# Patient Record
Sex: Female | Born: 1955
Health system: Southern US, Community
[De-identification: ages and names within clinical notes are randomized; demographics above are authoritative.]

## PROBLEM LIST (undated history)

## (undated) DIAGNOSIS — E559 Vitamin D deficiency, unspecified: Secondary | ICD-10-CM

## (undated) DIAGNOSIS — J90 Pleural effusion, not elsewhere classified: Secondary | ICD-10-CM

## (undated) DIAGNOSIS — F329 Major depressive disorder, single episode, unspecified: Secondary | ICD-10-CM

## (undated) DIAGNOSIS — A0472 Enterocolitis due to Clostridium difficile, not specified as recurrent: Secondary | ICD-10-CM

## (undated) DIAGNOSIS — C719 Malignant neoplasm of brain, unspecified: Secondary | ICD-10-CM

## (undated) DIAGNOSIS — M1990 Unspecified osteoarthritis, unspecified site: Secondary | ICD-10-CM

## (undated) DIAGNOSIS — E785 Hyperlipidemia, unspecified: Secondary | ICD-10-CM

## (undated) DIAGNOSIS — I341 Nonrheumatic mitral (valve) prolapse: Secondary | ICD-10-CM

## (undated) DIAGNOSIS — N3281 Overactive bladder: Secondary | ICD-10-CM

## (undated) DIAGNOSIS — I340 Nonrheumatic mitral (valve) insufficiency: Secondary | ICD-10-CM

## (undated) DIAGNOSIS — G709 Myoneural disorder, unspecified: Secondary | ICD-10-CM

## (undated) DIAGNOSIS — M810 Age-related osteoporosis without current pathological fracture: Secondary | ICD-10-CM

## (undated) DIAGNOSIS — F32A Depression, unspecified: Secondary | ICD-10-CM

## (undated) HISTORY — DX: Nonrheumatic mitral (valve) prolapse: I34.1

## (undated) HISTORY — DX: Hyperlipidemia, unspecified: E78.5

## (undated) HISTORY — DX: Age-related osteoporosis without current pathological fracture: M81.0

## (undated) HISTORY — DX: Vitamin D deficiency, unspecified: E55.9

## (undated) HISTORY — DX: Major depressive disorder, single episode, unspecified: F32.9

## (undated) HISTORY — DX: Depression, unspecified: F32.A

## (undated) HISTORY — PX: KNEE ARTHROSCOPY: SUR90

## (undated) HISTORY — DX: Malignant neoplasm of brain, unspecified: C71.9

## (undated) HISTORY — DX: Unspecified osteoarthritis, unspecified site: M19.90

## (undated) HISTORY — DX: Pleural effusion, not elsewhere classified: J90

## (undated) HISTORY — DX: Enterocolitis due to Clostridium difficile, not specified as recurrent: A04.72

## (undated) HISTORY — PX: CARDIAC CATHETERIZATION: SHX172

## (undated) HISTORY — DX: Nonrheumatic mitral (valve) insufficiency: I34.0

---
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
## 1999-06-26 ENCOUNTER — Other Ambulatory Visit: Admission: RE | Admit: 1999-06-26 | Discharge: 1999-06-26 | Payer: Self-pay | Admitting: Endocrinology

## 1999-08-27 ENCOUNTER — Encounter: Admission: RE | Admit: 1999-08-27 | Discharge: 1999-08-27 | Payer: Self-pay | Admitting: Endocrinology

## 1999-08-27 ENCOUNTER — Encounter: Payer: Self-pay | Admitting: Endocrinology

## 1999-10-08 ENCOUNTER — Encounter: Payer: Self-pay | Admitting: Neurosurgery

## 1999-10-08 ENCOUNTER — Encounter: Admission: RE | Admit: 1999-10-08 | Discharge: 1999-10-08 | Payer: Self-pay | Admitting: Neurosurgery

## 2000-03-05 ENCOUNTER — Encounter: Payer: Self-pay | Admitting: Endocrinology

## 2000-03-05 ENCOUNTER — Encounter: Admission: RE | Admit: 2000-03-05 | Discharge: 2000-03-05 | Payer: Self-pay | Admitting: Endocrinology

## 2000-04-07 ENCOUNTER — Other Ambulatory Visit: Admission: RE | Admit: 2000-04-07 | Discharge: 2000-04-07 | Payer: Self-pay | Admitting: Obstetrics and Gynecology

## 2000-09-18 ENCOUNTER — Encounter: Admission: RE | Admit: 2000-09-18 | Discharge: 2000-09-18 | Payer: Self-pay | Admitting: Neurosurgery

## 2000-09-18 ENCOUNTER — Encounter: Payer: Self-pay | Admitting: Neurosurgery

## 2000-11-04 ENCOUNTER — Encounter: Payer: Self-pay | Admitting: Endocrinology

## 2000-11-04 ENCOUNTER — Encounter: Admission: RE | Admit: 2000-11-04 | Discharge: 2000-11-04 | Payer: Self-pay | Admitting: Endocrinology

## 2001-08-04 ENCOUNTER — Other Ambulatory Visit: Admission: RE | Admit: 2001-08-04 | Discharge: 2001-08-04 | Payer: Self-pay | Admitting: Obstetrics and Gynecology

## 2001-08-11 ENCOUNTER — Encounter: Admission: RE | Admit: 2001-08-11 | Discharge: 2001-08-11 | Payer: Self-pay | Admitting: Obstetrics and Gynecology

## 2001-08-11 ENCOUNTER — Encounter: Payer: Self-pay | Admitting: Obstetrics and Gynecology

## 2002-03-22 ENCOUNTER — Encounter: Admission: RE | Admit: 2002-03-22 | Discharge: 2002-03-22 | Payer: Self-pay | Admitting: Neurosurgery

## 2002-03-22 ENCOUNTER — Encounter: Payer: Self-pay | Admitting: Neurosurgery

## 2003-11-01 ENCOUNTER — Other Ambulatory Visit: Admission: RE | Admit: 2003-11-01 | Discharge: 2003-11-01 | Payer: Self-pay | Admitting: Obstetrics and Gynecology

## 2003-11-03 ENCOUNTER — Encounter: Admission: RE | Admit: 2003-11-03 | Discharge: 2003-11-03 | Payer: Self-pay | Admitting: Obstetrics and Gynecology

## 2004-02-28 ENCOUNTER — Encounter: Admission: RE | Admit: 2004-02-28 | Discharge: 2004-02-28 | Payer: Self-pay | Admitting: Obstetrics and Gynecology

## 2004-07-19 ENCOUNTER — Ambulatory Visit: Payer: Self-pay | Admitting: Internal Medicine

## 2004-07-27 ENCOUNTER — Ambulatory Visit (HOSPITAL_COMMUNITY): Admission: RE | Admit: 2004-07-27 | Discharge: 2004-07-27 | Payer: Self-pay | Admitting: Internal Medicine

## 2004-07-27 ENCOUNTER — Ambulatory Visit: Payer: Self-pay | Admitting: Internal Medicine

## 2004-10-31 ENCOUNTER — Encounter: Payer: Self-pay | Admitting: Cardiology

## 2004-11-21 ENCOUNTER — Encounter: Admission: RE | Admit: 2004-11-21 | Discharge: 2004-11-21 | Payer: Self-pay | Admitting: Obstetrics and Gynecology

## 2005-10-08 ENCOUNTER — Encounter: Admission: RE | Admit: 2005-10-08 | Discharge: 2005-10-08 | Payer: Self-pay | Admitting: Neurology

## 2006-01-09 ENCOUNTER — Encounter: Admission: RE | Admit: 2006-01-09 | Discharge: 2006-01-09 | Payer: Self-pay | Admitting: Obstetrics and Gynecology

## 2007-06-22 ENCOUNTER — Ambulatory Visit (HOSPITAL_COMMUNITY): Admission: RE | Admit: 2007-06-22 | Discharge: 2007-06-22 | Payer: Self-pay | Admitting: Endocrinology

## 2007-08-05 ENCOUNTER — Encounter: Admission: RE | Admit: 2007-08-05 | Discharge: 2007-08-05 | Payer: Self-pay | Admitting: Endocrinology

## 2007-10-16 ENCOUNTER — Ambulatory Visit: Payer: Self-pay | Admitting: Vascular Surgery

## 2008-06-27 ENCOUNTER — Ambulatory Visit (HOSPITAL_COMMUNITY): Admission: RE | Admit: 2008-06-27 | Discharge: 2008-06-27 | Payer: Self-pay | Admitting: Endocrinology

## 2008-08-11 ENCOUNTER — Encounter: Admission: RE | Admit: 2008-08-11 | Discharge: 2008-08-11 | Payer: Self-pay | Admitting: Endocrinology

## 2008-11-02 ENCOUNTER — Encounter: Payer: Self-pay | Admitting: Cardiology

## 2009-07-03 ENCOUNTER — Ambulatory Visit (HOSPITAL_COMMUNITY): Admission: RE | Admit: 2009-07-03 | Discharge: 2009-07-03 | Payer: Self-pay | Admitting: Endocrinology

## 2010-03-05 ENCOUNTER — Encounter: Admission: RE | Admit: 2010-03-05 | Discharge: 2010-03-05 | Payer: Self-pay | Admitting: Endocrinology

## 2010-10-01 ENCOUNTER — Ambulatory Visit (HOSPITAL_COMMUNITY)
Admission: RE | Admit: 2010-10-01 | Discharge: 2010-10-01 | Payer: Self-pay | Source: Home / Self Care | Attending: Endocrinology | Admitting: Endocrinology

## 2010-12-27 ENCOUNTER — Other Ambulatory Visit: Payer: Self-pay | Admitting: Cardiology

## 2010-12-27 DIAGNOSIS — I341 Nonrheumatic mitral (valve) prolapse: Secondary | ICD-10-CM

## 2011-01-22 NOTE — Procedures (Signed)
LOWER EXTREMITY ARTERIAL EVALUATION-SINGLE LEVEL   INDICATION:  Both feet are cold, the patient has pain in the bottom of  her right foot.   HISTORY:  Diabetes:  No.  Cardiac:  No.  Hypertension:  No.  Smoking:  No.  Previous Surgery:  No.   RESTING SYSTOLIC PRESSURES: (ABI)                          RIGHT                LEFT  Brachial:               94                   90  Anterior tibial:        94 (1.0)             92  Posterior tibial:       92                   98 (>1.0)  Peroneal:  DOPPLER WAVEFORM ANALYSIS:  Anterior tibial:        Triphasic            Triphasic  Posterior tibial:       Triphasic            Triphasic  Peroneal:   PREVIOUS ABI'S:  Date:  RIGHT:  LEFT:   IMPRESSION:  1. No evidence of lower extremity arterial occlusive disease      bilaterally.  2. A preliminary copy was faxed to Dr. Rinaldo Cloud office.   ___________________________________________  Di Kindle. Edilia Bo, M.D.   DP/MEDQ  D:  10/16/2007  T:  10/18/2007  Job:  [id number]

## 2011-02-08 HISTORY — PX: US ECHOCARDIOGRAPHY: HXRAD669

## 2011-02-26 ENCOUNTER — Ambulatory Visit: Payer: Self-pay | Admitting: Cardiology

## 2011-02-28 ENCOUNTER — Ambulatory Visit: Payer: Self-pay | Admitting: Cardiology

## 2011-03-01 ENCOUNTER — Encounter: Payer: Self-pay | Admitting: *Deleted

## 2011-03-01 ENCOUNTER — Encounter: Payer: Self-pay | Admitting: Cardiology

## 2011-03-04 ENCOUNTER — Ambulatory Visit (HOSPITAL_COMMUNITY): Payer: Medicare Other | Attending: Cardiology

## 2011-03-04 ENCOUNTER — Ambulatory Visit (INDEPENDENT_AMBULATORY_CARE_PROVIDER_SITE_OTHER): Payer: Medicare Other | Admitting: Cardiology

## 2011-03-04 ENCOUNTER — Encounter: Payer: Self-pay | Admitting: Cardiology

## 2011-03-04 ENCOUNTER — Other Ambulatory Visit (HOSPITAL_COMMUNITY): Payer: Self-pay | Admitting: Radiology

## 2011-03-04 VITALS — BP 86/58 | HR 96 | Ht 60.0 in | Wt 85.4 lb

## 2011-03-04 DIAGNOSIS — I079 Rheumatic tricuspid valve disease, unspecified: Secondary | ICD-10-CM | POA: Insufficient documentation

## 2011-03-04 DIAGNOSIS — I379 Nonrheumatic pulmonary valve disorder, unspecified: Secondary | ICD-10-CM | POA: Insufficient documentation

## 2011-03-04 DIAGNOSIS — I059 Rheumatic mitral valve disease, unspecified: Secondary | ICD-10-CM

## 2011-03-04 DIAGNOSIS — I340 Nonrheumatic mitral (valve) insufficiency: Secondary | ICD-10-CM | POA: Insufficient documentation

## 2011-03-04 DIAGNOSIS — R0602 Shortness of breath: Secondary | ICD-10-CM

## 2011-03-04 DIAGNOSIS — I341 Nonrheumatic mitral (valve) prolapse: Secondary | ICD-10-CM

## 2011-03-04 NOTE — Assessment & Plan Note (Signed)
Patient appears to have severe mitral regurgitation with bileaflet MV prolapse on echo.  This is progressive from 2006.  The LV is not enlarged and EF is about 60%.  She has not noted significant exertional dyspnea but she has been having symptoms at night consistent with possible PND and worsening orthopnea.  On exam, she is not volume overloaded.  She would be a difficult candidate for mitral valve repair given her baseline physical limitations.  Rehab post-op would be difficult for her.  I am going to do a TEE to more closely assess the valve (is MR truly severe?).  She has no difficulty with swallowing.  If she has severe MR, it would seem to be symptomatic given increased nocturnal symptoms.  I will have her back to discuss options/further plans after TEE.  It may be that a minimally invasive MV repair would be feasible.  She has had stable ataxia/gait instability/spasticity for years and has compensated fairly well (lives alone, goes to [employer], Catering manager).  She is relatively young at 59.  I will check a BNP.   We will call her PCP's office to get a copy of her most recent lipids.

## 2011-03-04 NOTE — Patient Instructions (Addendum)
Your physician recommends that you schedule a follow-up appointment in: 2-3 WEEKS WITH DR [facility] Your physician recommends that you continue on your current medications as directed. Please refer to the Current Medication list given to you today. CONT TO TAKE BABY  ASPIRIN 81 MG   Your physician recommends that you return for lab work in: TODAY BMET BNP    DX 424.0  Your physician has requested that you have a TEE. During a TEE, sound waves are used to create images of your heart. It provides your doctor with information about the size and shape of your heart and how well your heart's chambers and valves are working. In this test, a transducer is attached to the end of a flexible tube that's guided down your throat and into your esophagus (the tube leading from you mouth to your stomach) to get a more detailed image of your heart. You are not awake for the procedure. Please see the instruction sheet given to you today. For further information please visit [email or web address]

## 2011-03-04 NOTE — Progress Notes (Signed)
PCP: Dr. 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  She has seen Dr. Deborah Chalk in the past and is seeing me for the first time today.  Her last echo prior to today was in 2006 and showed mitral valve prolapse with moderate MR.  She had an echo done today which I reviewed.  This showed normal LV size and systolic function with bileaflet mitral valve prolapse and probably severe mitral regurgitation.   Patient has poor balance from ataxia and can walk limited distances with a walker but typically uses her wheelchair.  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  She goes to the [facility] twice a week and uses a rowing machine.  She also does yoga.  She has not noted shortness of breath at the [facility] or with wheeling her wheelchair.  She has noted more shortness of breath at night.  She has had episodes consistent with PND recently.  She has chronically had to sleep on 2 pillows due to a sensation of "smothering."  Lately, she has increased this to 3 pillows.  No chest pain.  Her BP is 86/50 today.  She says it is common for her BP to run in the 80s-90s systolic.   ECG: NSR, normal  PMH:  1. Mitral valve prolapse with mitral regurgitation.  Echo (2/06): Moderate MR.  Echo (6/12): EF 60%, normal LV size, indeterminant diastolic function (suspect pseudonormal), bileaflet mitral valve prolapse with severe mitral regurgitation, moderate LAE, PA systolic pressure 28 mmHg.  2. Astrocytoma s/p resection and radiation in 1971.  She was left with residual spasticity and ataxia and is mainly limited to a wheelchair.  3. Hyperlipidemia 4. Arterial dopplers (2/09): normal  SH: Uses wheelchair but can walk short distances with walker. Lives by herself in [place] but sister and mother help.  Nonsmoker.  Occasional ETOH.   FH: Father with MI at 56  ROS: all systems reviewed and negative except as  per HPI.   Current Outpatient Prescriptions  Medication Sig Dispense Refill  . aspirin 81 MG EC tablet Take 81 mg by mouth daily.        Marland Kitchen imipramine (TOFRANIL) 25 MG tablet Take 25 mg by mouth at bedtime.        . meloxicam (MOBIC) 15 MG tablet Take 15 mg by mouth daily.        . simvastatin (ZOCOR) 40 MG tablet Take 40 mg by mouth at bedtime.        . tolterodine (DETROL LA) 4 MG 24 hr capsule Take 4 mg by mouth daily.          BP 86/58  Pulse 96  Ht 5' (1.524 m)  Wt 85 lb 6.4 oz (38.737 kg)  BMI 16.68 kg/m2 General: thin, no distress Neck: No JVD, no thyromegaly or thyroid nodule.  Lungs: Clear to auscultation bilaterally with normal respiratory effort. CV: Nondisplaced PMI.  Heart regular S1/S2, no S3/S4, 3/6 HSM at apex.  No peripheral edema.  No carotid bruit.  Normal pedal pulses.  Abdomen: Soft, nontender, no hepatosplenomegaly, no distention.  Neurologic: Alert and oriented x 3, gait ataxia, resting tremor noted in hands.  Psych: Normal affect. Extremities: No clubbing or cyanosis.

## 2011-03-05 ENCOUNTER — Encounter: Payer: Self-pay | Admitting: *Deleted

## 2011-03-06 ENCOUNTER — Ambulatory Visit (HOSPITAL_COMMUNITY): Admission: RE | Admit: 2011-03-06 | Payer: Medicare Other | Source: Ambulatory Visit | Admitting: Cardiovascular Disease

## 2011-03-10 HISTORY — PX: TRANSESOPHAGEAL ECHOCARDIOGRAM: SHX273

## 2011-03-18 ENCOUNTER — Ambulatory Visit (HOSPITAL_COMMUNITY)
Admission: RE | Admit: 2011-03-18 | Discharge: 2011-03-18 | Disposition: A | Discharge status: Home / Self Care | Payer: Medicare Other | Source: Ambulatory Visit | Attending: Cardiology | Admitting: Cardiology

## 2011-03-18 ENCOUNTER — Other Ambulatory Visit: Payer: Self-pay | Admitting: *Deleted

## 2011-03-18 DIAGNOSIS — I059 Rheumatic mitral valve disease, unspecified: Secondary | ICD-10-CM

## 2011-03-18 DIAGNOSIS — R0602 Shortness of breath: Secondary | ICD-10-CM

## 2011-03-20 ENCOUNTER — Telehealth: Payer: Self-pay | Admitting: Cardiology

## 2011-03-20 NOTE — Telephone Encounter (Signed)
Per pt call, pt said she was prescribed a diuretic (water pill), pt said she is also taking detrol, she said one makes her urinate and one makes her stop urinating. Pt would like to know if she needs to continue taking both medications.

## 2011-03-20 NOTE — Telephone Encounter (Signed)
Pt calling stating is taking detrol for bladder incontinence prescribed by urologist and recently prescribed diuretic by dr mclean--pt states "one makes me urinate and the other stops me from urinating--which one should i take"--advised--please stay on diuretic and may stop detrol for now--i will contact dr Shirlee Latch and find out which med he wants her on and call her back--pt agrees--nt

## 2011-03-20 NOTE — Telephone Encounter (Signed)
Pt called and given information from Dr. Shirlee Latch

## 2011-03-20 NOTE — Telephone Encounter (Signed)
She can take both.  Detrol just stops bladder spasm.  It won't stop her from urinating altogether.

## 2011-03-25 ENCOUNTER — Telehealth: Payer: Self-pay | Admitting: Cardiology

## 2011-03-25 NOTE — Telephone Encounter (Signed)
Pt has appt tomorrow, pt would like to see pictures of echocardiogram during visit if possible.

## 2011-03-25 NOTE — Telephone Encounter (Signed)
Pt can look at echo pictures tomorrow per Dr Shirlee Latch. Pt is aware.

## 2011-03-26 ENCOUNTER — Ambulatory Visit (INDEPENDENT_AMBULATORY_CARE_PROVIDER_SITE_OTHER): Payer: Medicare Other | Admitting: Cardiology

## 2011-03-26 ENCOUNTER — Encounter: Payer: Self-pay | Admitting: Cardiology

## 2011-03-26 ENCOUNTER — Other Ambulatory Visit (INDEPENDENT_AMBULATORY_CARE_PROVIDER_SITE_OTHER): Payer: Medicare Other | Admitting: *Deleted

## 2011-03-26 DIAGNOSIS — I340 Nonrheumatic mitral (valve) insufficiency: Secondary | ICD-10-CM

## 2011-03-26 DIAGNOSIS — R0602 Shortness of breath: Secondary | ICD-10-CM

## 2011-03-26 DIAGNOSIS — I059 Rheumatic mitral valve disease, unspecified: Secondary | ICD-10-CM

## 2011-03-26 LAB — BASIC METABOLIC PANEL
CO2: 32 mEq/L (ref 19–32)
Glucose, Bld: 98 mg/dL (ref 70–99)
Potassium: 3.9 mEq/L (ref 3.5–5.1)
Sodium: 140 mEq/L (ref 135–145)

## 2011-03-26 NOTE — Patient Instructions (Signed)
Lab today--BMP/BNP 424.0  Schedule an appointment with Dr Shirlee Latch in 3 months.  Schedule an appointment for an echocardiogram in December 2012.

## 2011-03-26 NOTE — Progress Notes (Signed)
PCP: Dr. 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  Echo at last visit showed normal LV size and systolic function with bileaflet mitral valve prolapse and probably severe mitral regurgitation.   Patient has poor balance from ataxia and can walk limited distances with a walker but typically uses her wheelchair.  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  She goes to the [facility] twice a week and uses a rowing machine.  She also does yoga. Lately, she noted more shortness of breath at night.  She has had episodes consistent with PND.  She has chronically had to sleep on 2 pillows due to a sensation of "smothering."  Lately, she has increased this to 3 pillows.  No chest pain.    I did a TEE to more closely assess the valve in 7/12.  This confirmed severe MR with bileaflet prolapse and EF 60%.  I started her on Lasix 20 mg daily, and she reports significant improvement in breathing.  She is no longer having PND episodes and does not have orthopnea.    Labs (6/12): BNP 88  PMH:  1. Mitral valve prolapse with mitral regurgitation.  Echo (2/06): Moderate MR.  Echo (6/12): EF 60%, normal LV size, indeterminant diastolic function (suspect pseudonormal), bileaflet mitral valve prolapse with severe mitral regurgitation, moderate LAE, PA systolic pressure 28 mmHg.  TEE (7/12): EF 60%, normal LV size, severe MR with bileaflet prolapse, RV-RA gradient 30 mmHg.  2. Astrocytoma s/p resection and radiation in 1971.  She was left with residual spasticity and ataxia and is mainly limited to a wheelchair.  3. Hyperlipidemia 4. Arterial dopplers (2/09): normal  SH: Uses wheelchair but can walk short distances with walker. Lives by herself in [place] but sister and mother help.  Nonsmoker.  Occasional ETOH.   FH: Father with MI at 23  ROS: all systems reviewed and negative except as per  HPI.   Current Outpatient Prescriptions  Medication Sig Dispense Refill  . aspirin 81 MG EC tablet Take 81 mg by mouth daily.        Marland Kitchen docusate sodium (COLACE) 100 MG capsule Take 200 mg by mouth every evening.        . furosemide (LASIX) 20 MG tablet Take 20 mg by mouth 2 (two) times daily.        Marland Kitchen imipramine (TOFRANIL) 25 MG tablet Take 25 mg by mouth at bedtime.        . meloxicam (MOBIC) 15 MG tablet Take 15 mg by mouth daily.        . polyethylene glycol (MIRALAX / GLYCOLAX) packet Take 17 g by mouth daily.        . potassium chloride (KLOR-CON) 10 MEQ CR tablet Take 10 mEq by mouth daily.        . simvastatin (ZOCOR) 40 MG tablet Take 40 mg by mouth at bedtime.        . Somatropin (NORDITROPIN FLEXPRO) 10 MG/1.5ML SOLN Inject into the skin every evening.        . tolterodine (DETROL LA) 4 MG 24 hr capsule Take 4 mg by mouth daily.        . zoledronic acid (RECLAST) 5 MG/100ML SOLN Inject 5 mg into the vein once. Once a year         BP 99/62  Pulse 102  Ht 4\' 11"  (1.499 m)  Wt  87 lb (39.463 kg)  BMI 17.57 kg/m2 General: thin, no distress Neck: No JVD, no thyromegaly or thyroid nodule.  Lungs: Clear to auscultation bilaterally with normal respiratory effort. CV: Nondisplaced PMI.  Heart regular S1/S2, no S3/S4, 3/6 HSM at apex.  No peripheral edema.  No carotid bruit.  Normal pedal pulses.  Abdomen: Soft, nontender, no hepatosplenomegaly, no distention.  Neurologic: Alert and oriented x 3, gait ataxia, resting tremor noted in hands.  Psych: Normal affect. Extremities: No clubbing or cyanosis.

## 2011-03-26 NOTE — Assessment & Plan Note (Signed)
Patient has severe mitral regurgitation with bileaflet MV prolapse confirmed by TEE.  This is progressive from 2006.  The LV is not enlarged and EF is about 60%.  Her mitral regurgitation is symptomatic, with PND and orthopnea having developed in the last few months and relieved more recently by Lasix.  On exam, she is not volume overloaded.  In the absence of her comorbidities, it would be time to repair the mitral valve.  However, she would be a difficult candidate for mitral valve repair given her baseline physical limitations.  Rehab post-op would be very hard for her.  It may be that a minimally invasive MV repair would be feasible.  She has had stable ataxia/gait instability/spasticity for years and has compensated fairly well (lives alone, goes to [employer], Catering manager).  She is relatively young at 28.   I had a long discussion today with the patient, her mother, and her sister.  She feel symptomatically much better on Lasix.  I am going to continue this for now and will repeat an echo in 12/12 (6 months) look for any LV enlargement or fall in EF.  I offered to let her talk to a cardiac surgeon to get an idea about whether she would be a candidate for minimally invasive repair and about risks/recovery time. She will think about this.

## 2011-05-14 ENCOUNTER — Ambulatory Visit: Payer: Medicare Other | Attending: Endocrinology | Admitting: Physical Therapy

## 2011-05-14 DIAGNOSIS — R262 Difficulty in walking, not elsewhere classified: Secondary | ICD-10-CM | POA: Insufficient documentation

## 2011-05-14 DIAGNOSIS — IMO0001 Reserved for inherently not codable concepts without codable children: Secondary | ICD-10-CM | POA: Insufficient documentation

## 2011-06-11 ENCOUNTER — Other Ambulatory Visit: Payer: Self-pay | Admitting: Endocrinology

## 2011-06-11 DIAGNOSIS — Z1231 Encounter for screening mammogram for malignant neoplasm of breast: Secondary | ICD-10-CM

## 2011-06-21 ENCOUNTER — Ambulatory Visit
Admission: RE | Admit: 2011-06-21 | Discharge: 2011-06-21 | Disposition: A | Discharge status: Home / Self Care | Payer: Medicare Other | Source: Ambulatory Visit | Attending: Endocrinology | Admitting: Endocrinology

## 2011-06-21 DIAGNOSIS — Z1231 Encounter for screening mammogram for malignant neoplasm of breast: Secondary | ICD-10-CM

## 2011-06-24 ENCOUNTER — Telehealth: Payer: Self-pay | Admitting: Cardiology

## 2011-06-24 NOTE — Telephone Encounter (Signed)
Pt calling re something about a surgeon, can't get what the call is about from her

## 2011-06-24 NOTE — Telephone Encounter (Signed)
Patient would like to have an appointment with the cardiac surgeon to see if she is a candidate for repair, risks and recovery time. Patient has an appointment with Dr. Shirlee Latch this coming Thursday 06/27/11.

## 2011-06-25 NOTE — Telephone Encounter (Signed)
Pt has an appt with Dr Shirlee Latch 06/27/11. Dr Shirlee Latch will discuss appt with surgeon at time of appt with him 06/27/11. -Pt is aware Dr Shirlee Latch will discuss appt with surgeon with her at appt 06/27/11.

## 2011-06-27 ENCOUNTER — Encounter: Payer: Self-pay | Admitting: Cardiology

## 2011-06-27 ENCOUNTER — Ambulatory Visit (INDEPENDENT_AMBULATORY_CARE_PROVIDER_SITE_OTHER): Payer: Medicare Other | Admitting: Cardiology

## 2011-06-27 VITALS — BP 110/58 | HR 100 | Ht 59.0 in | Wt 87.0 lb

## 2011-06-27 DIAGNOSIS — R0602 Shortness of breath: Secondary | ICD-10-CM

## 2011-06-27 DIAGNOSIS — I059 Rheumatic mitral valve disease, unspecified: Secondary | ICD-10-CM

## 2011-06-27 DIAGNOSIS — I340 Nonrheumatic mitral (valve) insufficiency: Secondary | ICD-10-CM

## 2011-06-27 LAB — BASIC METABOLIC PANEL
BUN: 24 mg/dL — ABNORMAL HIGH (ref 6–23)
Chloride: 101 mEq/L (ref 96–112)
Glucose, Bld: 98 mg/dL (ref 70–99)
Potassium: 3.7 mEq/L (ref 3.5–5.1)

## 2011-06-27 NOTE — Patient Instructions (Signed)
Lab today--BMP/BNP 424.0  Schedule an appointment to see Dr Cornelius Moras about mitral valve surgery.  Keep the appointment for the echo in December. If you have surgery before then the appointment for the echo can be cancelled.  Your physician wants you to follow-up in: 3 months with Dr Shirlee Latch. (January 2013). You will receive a reminder letter in the mail two months in advance. If you don't receive a letter, please call our office to schedule the follow-up appointment.

## 2011-06-27 NOTE — Progress Notes (Signed)
PCP: Dr. 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  Echo recently showed normal LV size and systolic function with bileaflet mitral valve prolapse and probably severe mitral regurgitation.   Patient has poor balance from ataxia and can walk limited distances with a walker but typically uses her wheelchair.  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  She goes to the [facility] twice a week and uses a rowing machine.  She also does yoga. This summer, she noted more shortness of breath at night.  She had episodes consistent with PND.  She has chronically had to sleep on 2 pillows due to a sensation of "smothering."  This summer, she increased this to 3 pillows.  No chest pain.    I did a TEE to more closely assess the valve in 7/12.  This confirmed severe MR with bileaflet prolapse and EF 60%.  I started her on Lasix 20 mg daily (later increased to 20 mg bid), and she reports significant improvement in breathing.  She is no longer having PND episodes and does not have orthopnea.  She continues to be symptomatically stable on Lasix bid.  She asks again about mitral valve surgery and whether I think she could tolerate it.      Labs (6/12): BNP 88 Labs (7/12): K 3.9, creatinine 0.6, BNP 86  PMH:  1. Mitral valve prolapse with mitral regurgitation.  Echo (2/06): Moderate MR.  Echo (6/12): EF 60%, normal LV size, indeterminant diastolic function (suspect pseudonormal), bileaflet mitral valve prolapse with severe mitral regurgitation, moderate LAE, PA systolic pressure 28 mmHg.  TEE (7/12): EF 60%, normal LV size, severe MR with bileaflet prolapse, RV-RA gradient 30 mmHg.  Patient has had CHF symptoms and is now on Lasix.  2. Astrocytoma s/p resection and radiation in 1971.  She was left with residual spasticity and ataxia and is mainly limited to a wheelchair.  3. Hyperlipidemia 4.  Arterial dopplers (2/09): normal  SH: Uses wheelchair but can walk short distances with walker. Lives by herself in [place] but sister and mother help.  Nonsmoker.  Occasional ETOH.   FH: Father with MI at 1  ROS: all systems reviewed and negative except as per HPI.   Current Outpatient Prescriptions  Medication Sig Dispense Refill  . aspirin 81 MG EC tablet Take 81 mg by mouth daily.        Marland Kitchen docusate sodium (COLACE) 100 MG capsule Take 200 mg by mouth every evening.        . furosemide (LASIX) 20 MG tablet Take 20 mg by mouth 2 (two) times daily.        Marland Kitchen imipramine (TOFRANIL) 25 MG tablet Take 25 mg by mouth at bedtime.        . meloxicam (MOBIC) 15 MG tablet Take 15 mg by mouth as needed.       . polyethylene glycol (MIRALAX / GLYCOLAX) packet Take 17 g by mouth daily.        . potassium chloride (KLOR-CON) 10 MEQ CR tablet Take 10 mEq by mouth daily.        . simvastatin (ZOCOR) 40 MG tablet Take 40 mg by mouth daily.       . Somatropin (NORDITROPIN FLEXPRO) 10 MG/1.5ML SOLN Inject into the skin every evening.        . tolterodine (DETROL LA) 4 MG 24 hr capsule Take 4  mg by mouth daily.        . zoledronic acid (RECLAST) 5 MG/100ML SOLN Inject 5 mg into the vein once. Once a year         BP 110/58  Pulse 100  Ht 4\' 11"  (1.499 m)  Wt 87 lb (39.463 kg)  BMI 17.57 kg/m2 General: thin, no distress Neck: No JVD, no thyromegaly or thyroid nodule.  Lungs: Clear to auscultation bilaterally with normal respiratory effort. CV: Nondisplaced PMI.  Heart regular S1/S2, no S3/S4, 3/6 HSM at apex.  No peripheral edema.  No carotid bruit.  Normal pedal pulses.  Abdomen: Soft, nontender, no hepatosplenomegaly, no distention.  Neurologic: Alert and oriented x 3, gait ataxia, resting tremor noted in hands.  Psych: Normal affect. Extremities: No clubbing or cyanosis.

## 2011-06-27 NOTE — Assessment & Plan Note (Signed)
Patient has severe mitral regurgitation with bileaflet MV prolapse confirmed by TEE.  This is progressive from 2006.  The LV is not enlarged and EF is about 60%.  Her mitral regurgitation is symptomatic, with PND and orthopnea having developed this summer and relieved by Lasix.  On exam, she is not volume overloaded.  In the absence of her comorbidities, it would be time to repair the mitral valve.  However, she would be a difficult candidate for mitral valve repair given her baseline physical limitations.  Rehab post-op would be very hard for her.  Given her small stature, I suspect that minimally invasive MV repair would not be feasible.  She has had stable ataxia/gait instability/spasticity for years and has compensated fairly well (lives alone, goes to [employer], Catering manager).  She is relatively young at 15.   I had another long discussion today with the patient and her sister.  She feels symptomatically much better on Lasix. She is interested in talking to a surgeon about her risk if she were to undergo MV surgery.  I am going to refer her to talk to Dr. Cornelius Moras.

## 2011-07-08 ENCOUNTER — Encounter: Payer: Self-pay | Admitting: Thoracic Surgery (Cardiothoracic Vascular Surgery)

## 2011-07-08 ENCOUNTER — Institutional Professional Consult (permissible substitution) (INDEPENDENT_AMBULATORY_CARE_PROVIDER_SITE_OTHER): Payer: Medicare Other | Admitting: Thoracic Surgery (Cardiothoracic Vascular Surgery)

## 2011-07-08 VITALS — BP 115/66 | HR 90 | Resp 20 | Ht 60.0 in | Wt 84.0 lb

## 2011-07-08 DIAGNOSIS — I059 Rheumatic mitral valve disease, unspecified: Secondary | ICD-10-CM

## 2011-07-08 DIAGNOSIS — I050 Rheumatic mitral stenosis: Secondary | ICD-10-CM

## 2011-07-08 DIAGNOSIS — I340 Nonrheumatic mitral (valve) insufficiency: Secondary | ICD-10-CM | POA: Insufficient documentation

## 2011-07-08 NOTE — Progress Notes (Signed)
PCP is Julian Hy, MD Referring Provider is [name], MD  Chief Complaint  Patient presents with  . 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 The patient also had a ventriculoperitoneal shunt for hydrocephalus at the time. She has managed remarkably well under the circumstances despite the fact that she has been left with chronic severe problems with instability of gait and spasticity. Despite this she lives alone and cares for herself. She is for the most part confined to wheelchair although she seems to get around remarkably well under the circumstances. She reports that when she was a teenager she was told that she had mitral valve prolapse. Approximately 12 years ago she was noted to have a prominent murmur on exam and she was referred for cardiology consultation. For years she had been followed by Dr. Deborah Chalk, and more recently she has been followed by Dr. Shirlee Latch. She describes a long history of orthopnea with episodes of paroxysmal nocturnal dyspnea. Some of these symptoms are associated with what the family terms "night terrors" but the patient reports that her primary problem is a sensation smothering or shortness of breath when she lays flat in bed. These symptoms are relieved by sitting up. More recently the symptoms have progressed and she is also developed some lower extremity edema. She underwent transesophageal echocardiogram in July of this year confirming the presence of mitral valve prolapse with severe mitral regurgitation. She was started on oral Lasix therapy at that time. Symptoms have improved somewhat on Lasix therapy, but the patient still had some associated orthopnea and shortness of breath. She has been referred to consider elective mitral valve  repair.   Past Medical History  Diagnosis Date  . 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 MVP (mitral valve prolapse)   . Mitral regurgitation     Past Surgical History  Procedure Date  . Ventriculoperitoneal shunt 1971  . Knee arthroscopy     right    History reviewed. No pertinent family history.  Social History History  Substance Use Topics  . Smoking status: Former Smoker    Quit date: 09/09/1990  . Smokeless tobacco: Not on file  . Alcohol Use: Yes    Current Outpatient Prescriptions  Medication Sig Dispense Refill  . aspirin 81 MG EC tablet Take 81 mg by mouth daily.        Marland Kitchen docusate sodium (COLACE) 100 MG capsule Take 200 mg by mouth every evening.        . furosemide (LASIX) 20 MG tablet Take 20 mg by mouth 2 (two) times daily.        Marland Kitchen imipramine (TOFRANIL) 25 MG tablet Take 25 mg by mouth at bedtime.        . meloxicam (MOBIC) 15 MG tablet Take 15 mg by mouth as needed.       . polyethylene glycol (MIRALAX / GLYCOLAX) packet Take 17 g by mouth daily.        . potassium chloride (KLOR-CON) 10 MEQ CR tablet Take 10 mEq by mouth daily.        . simvastatin (ZOCOR) 40 MG tablet Take 40 mg by mouth daily.       . Somatropin (NORDITROPIN FLEXPRO) 10 MG/1.5ML SOLN Inject into the skin every evening.        Marland Kitchen  tolterodine (DETROL LA) 4 MG 24 hr capsule Take 4 mg by mouth daily.        . zoledronic acid (RECLAST) 5 MG/100ML SOLN Inject 5 mg into the vein once. Once a year         Allergies  Allergen Reactions  . Compazine     Review of Systems  Constitutional: Negative for fever, chills, diaphoresis, activity change, appetite change, fatigue and unexpected weight change.  HENT: Negative.   Eyes: Negative.   Respiratory: Positive for shortness of breath. Negative for cough and chest tightness.   Cardiovascular: Positive for palpitations and leg swelling. Negative for chest pain.  Gastrointestinal: Negative.        Chronic constipation with  some reported increase "pressure" recently.  Genitourinary:       Neurogenic bladder with spasticity  Musculoskeletal: Positive for gait problem. Negative for back pain, joint swelling and arthralgias.  Neurological: Positive for tremors.       Severe instability with gait with lack of balance  Hematological: Negative.   Psychiatric/Behavioral: Negative.     BP 115/66  Pulse 90  Resp 20  Ht 5' (1.524 m)  Wt 84 lb (38.102 kg)  BMI 16.41 kg/m2  SpO2 100% Physical Exam  Vitals reviewed. Constitutional: She is oriented to person, place, and time.       Very thin and frail appearing, height 5'0" and weight 84 lbs.  Eyes: Pupils are equal, round, and reactive to light.  Neck: Neck supple. No JVD present. No thyromegaly present.       Palpable V-P shunt on right neck  Cardiovascular: Normal rate and regular rhythm.   Murmur heard.      Holosystolic murmur all across the precordium  Pulmonary/Chest: Breath sounds normal. No respiratory distress. She has no rales.       Palpable V-P shunt along medial right anterior chest through subcutaneous tissues  Abdominal: Soft. Bowel sounds are normal. She exhibits no mass. There is no tenderness.  Musculoskeletal: She exhibits no edema.  Lymphadenopathy:    She has no cervical adenopathy.  Neurological: She is alert and oriented to person, place, and time. She displays abnormal reflex. She exhibits abnormal muscle tone. Coordination abnormal.       Good strength but very poor coordination  Skin: Skin is warm and dry.  Psychiatric: She has a normal mood and affect. Her behavior is normal. Judgment and thought content normal.     Diagnostic Tests:  Transesophageal echocardiogram performed 03/18/2011 is reviewed. 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 There is severe (4+) mitral regurgitation. The jet of regurgitation is central and directed  slightly posteriorly. The left atrium is dilated. Left ventricular systolic function is normal. The aortic valve is normal. There is trivial tricuspid regurgitation. The mitral valve looked repairable, although there is considerable thickening of the leaflets. There does not appear to be much subvalvular calcification.   Impression:  Mitral valve prolapse severe mitral regurgitation. The patient has chronic symptoms of orthopnea and PND. She has recently also had some lower extremity edema with worsening shortness of breath. Symptoms have improved on Lasix therapy. Risks of surgery will be elevated do to the patient's underlying chronic neurologic issues with regards to severely limited mobility with instability of gait and spasticity. However, the symptoms are all quite chronic and well compensated. Overall I suspect that the patient would probably do reasonably well with surgery. She may be a candidate  for minimally invasive approach for surgery, although cardiac catheterization has not yet been performed. Avoiding a sternotomy would be helpful in that the patient does need to use her arms and shoulders a fair amount to assist herself mobility. Her small size but not necessarily preclude the ability to perform surgery through the mini thoracotomy approach. In addition, I think it would be possible to avoid her indwelling ventricular peritoneal shunt.   Plan:  I've discussed matters at length with the patient and her mother and sister here in the office today. The rationale for surgical intervention has been discussed and compared in contrast and to continued medical therapy with close followup. Risks associated with surgical intervention have been discussed in particular these have been framed with the understanding of her pre-existing limitations and medical conditions. All of their questions been addressed. The patient and her family desire to think things over for a little bit before making a final  decision. If she decides she is interested in proceeding with surgery she will need left and right heart catheterization. We would also like to image her descending thoracic and abdominal aorta at the time to see whether or not she might be candidate for mini thoracotomy approach. The patient will call to schedule a followup appointment with they have had time to think matters over further. All of their questions have been addressed.

## 2011-07-11 ENCOUNTER — Telehealth: Payer: Self-pay | Admitting: Cardiology

## 2011-07-11 NOTE — Telephone Encounter (Signed)
New message  Pt called about her BP She wanted to talk to you about it  Please call

## 2011-07-11 NOTE — Telephone Encounter (Signed)
I talked with pt. She was concerned because her diastolic BP has been 60 and 70 when it was recently checked. I told her this was OK. She is requesting to talk with Dr Shirlee Latch about possible valve surgery. I told her I would ask Dr Shirlee Latch to call her the first of next week. This was OK with the pt.

## 2011-07-15 NOTE — Telephone Encounter (Signed)
Dr McLean talked with pt.  

## 2011-08-06 ENCOUNTER — Telehealth: Payer: Self-pay | Admitting: Cardiology

## 2011-08-06 NOTE — Telephone Encounter (Signed)
New msg: Pt calling to speak to [facility]. Please return pt call to discuss further.

## 2011-08-06 NOTE — Telephone Encounter (Signed)
I talked with pt. Pt states today she had a discharge from her urine and stool that was pinkish in color. Pt states that she has not had any other symptoms including abdominal pain or problems urinating. I recommended pt follow-up with Dr Evlyn Kanner, her PCP for further recommendations.

## 2011-08-19 ENCOUNTER — Encounter: Payer: Self-pay | Admitting: *Deleted

## 2011-08-19 ENCOUNTER — Ambulatory Visit (INDEPENDENT_AMBULATORY_CARE_PROVIDER_SITE_OTHER): Payer: Medicare Other | Admitting: Cardiology

## 2011-08-19 ENCOUNTER — Other Ambulatory Visit (HOSPITAL_COMMUNITY): Payer: Medicare Other | Admitting: Radiology

## 2011-08-19 ENCOUNTER — Encounter: Payer: Self-pay | Admitting: Cardiology

## 2011-08-19 VITALS — BP 100/59 | HR 97 | Ht 59.0 in | Wt 88.0 lb

## 2011-08-19 DIAGNOSIS — I340 Nonrheumatic mitral (valve) insufficiency: Secondary | ICD-10-CM

## 2011-08-19 DIAGNOSIS — I059 Rheumatic mitral valve disease, unspecified: Secondary | ICD-10-CM

## 2011-08-19 NOTE — Patient Instructions (Signed)
Your physician recommends that you return for lab work January 3,2013--BMET/CBC/PT   Your physician has requested that you have a cardiac catheterization. Cardiac catheterization is used to diagnose and/or treat various heart conditions. Doctors may recommend this procedure for a number of different reasons. The most common reason is to evaluate chest pain. Chest pain can be a symptom of coronary artery disease (CAD), and cardiac catheterization can show whether plaque is narrowing or blocking your heart's arteries. This procedure is also used to evaluate the valves, as well as measure the blood flow and oxygen levels in different parts of your heart. For further information please visit [email or web address] Please follow instruction sheet, as given. Wednesday January 9,2013 at 11:30am

## 2011-08-19 NOTE — Assessment & Plan Note (Signed)
Patient has severe mitral regurgitation with bileaflet MV prolapse confirmed by TEE.  This is progressive from 2006.  The LV is not enlarged and EF is about 60%.  Her mitral regurgitation is symptomatic, with PND and orthopnea having developed this summer and relieved by Lasix.  On exam, she is not volume overloaded.  I have talked extensively with Michaela [name] and her family about surgery.  She has seen Dr. Cornelius Moras, and a right mini-thoracotomy approach may be feasible.  Rehab post-op is going to be very hard for her given her baseline limitations, and she understands that surgery is going to be a hard road.  She has had stable ataxia/gait instability/spasticity for years and has compensated fairly well (lives alone, goes to [employer], Catering manager).  She is relatively young at 58.  She is interested in going forward with surgery, potentially via right mini-thoracotomy.  I think this is a reasonable decision as I expect she will continue to develop worsening heart failure from her mitral regurgitation over time if treated only medically.  - I will do a left and right heart cath from the left groin.  I will do this after New Years as she wants to wait to do anything until after the holidays.

## 2011-08-19 NOTE — Progress Notes (Signed)
PCP: Dr. 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  Echo recently showed normal LV size and systolic function with bileaflet mitral valve prolapse and probably severe mitral regurgitation.   Patient has poor balance from ataxia and can walk limited distances with a walker but typically uses her wheelchair.  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  She goes to the [facility] twice a week and uses a rowing machine.  She also does yoga. This summer, she noted more shortness of breath at night.  She had episodes consistent with PND.  She has chronically had to sleep on 2 pillows due to a sensation of "smothering."  This summer, she increased this to 3 pillows.  No chest pain.    I did a TEE to more closely assess the valve in 7/12.  This confirmed severe MR with bileaflet prolapse and EF 60%.  I started her on Lasix 20 mg daily (later increased to 20 mg bid), and she reports significant improvement in breathing.  She is no longer having PND episodes and does not have orthopnea.  She continues to be symptomatically stable on Lasix bid.  She has been to see Dr. Cornelius Moras with [facility], who is willing to repair her mitral valve, preferentially by a right mini-thoracotomy.    Labs (6/12): BNP 88 Labs (7/12): K 3.9, creatinine 0.6, BNP 86 Labs (10/12): K 3.7, creatinine 0.6  PMH:  1. Mitral valve prolapse with mitral regurgitation.  Echo (2/06): Moderate MR.  Echo (6/12): EF 60%, normal LV size, indeterminant diastolic function (suspect pseudonormal), bileaflet mitral valve prolapse with severe mitral regurgitation, moderate LAE, PA systolic pressure 28 mmHg.  TEE (7/12): EF 60%, normal LV size, severe MR with bileaflet prolapse, RV-RA gradient 30 mmHg.  Patient has had CHF symptoms and is now on Lasix.  2. Astrocytoma s/p resection and radiation in 1971.  She was left with residual spasticity  and ataxia and is mainly limited to a wheelchair.  3. Hyperlipidemia 4. Arterial dopplers (2/09): normal  SH: Uses wheelchair but can walk short distances with walker. Lives by herself in [place] but sister and mother help.  Nonsmoker.  Occasional ETOH.   FH: Father with MI at 33  ROS: all systems reviewed and negative except as per HPI.   Current Outpatient Prescriptions  Medication Sig Dispense Refill  . aspirin 81 MG EC tablet Take 81 mg by mouth daily.        Marland Kitchen docusate sodium (COLACE) 100 MG capsule Take 200 mg by mouth every evening.        . furosemide (LASIX) 20 MG tablet Take 20 mg by mouth 2 (two) times daily.        Marland Kitchen imipramine (TOFRANIL) 25 MG tablet Take 25 mg by mouth at bedtime.        . meloxicam (MOBIC) 15 MG tablet Take 15 mg by mouth as needed.       . polyethylene glycol (MIRALAX / GLYCOLAX) packet Take 17 g by mouth daily.        . potassium chloride (KLOR-CON) 10 MEQ CR tablet Take 10 mEq by mouth daily.        . simvastatin (ZOCOR) 40 MG tablet Take 40 mg by mouth daily.       . Somatropin (NORDITROPIN FLEXPRO) 10 MG/1.5ML SOLN Inject into the skin every evening.        Marland Kitchen  tolterodine (DETROL LA) 4 MG 24 hr capsule Take 4 mg by mouth daily.        . zoledronic acid (RECLAST) 5 MG/100ML SOLN Inject 5 mg into the vein once. Once a year         BP 100/59  Pulse 97  Ht 4\' 11"  (1.499 m)  Wt 39.917 kg (88 lb)  BMI 17.77 kg/m2 General: thin, no distress Neck: No JVD, no thyromegaly or thyroid nodule.  Lungs: Clear to auscultation bilaterally with normal respiratory effort. CV: Nondisplaced PMI.  Heart regular S1/S2, no S3/S4, 3/6 HSM at apex.  No peripheral edema.  No carotid bruit.  Normal pedal pulses.  Abdomen: Soft, nontender, no hepatosplenomegaly, no distention.  Neurologic: Alert and oriented x 3, gait ataxia, resting tremor noted in hands.  Psych: Normal affect. Extremities: No clubbing or cyanosis.

## 2011-08-29 ENCOUNTER — Telehealth: Payer: Self-pay | Admitting: Cardiology

## 2011-08-29 NOTE — Telephone Encounter (Signed)
Talked with pt 

## 2011-08-29 NOTE — Telephone Encounter (Signed)
New msg Pt called she wants to know about appt on 12/24 please call her back

## 2011-09-02 ENCOUNTER — Other Ambulatory Visit: Payer: Medicare Other | Admitting: *Deleted

## 2011-09-09 ENCOUNTER — Telehealth: Payer: Self-pay | Admitting: Cardiology

## 2011-09-09 NOTE — Telephone Encounter (Signed)
New Problem:    Patient's legal guardian called to see if it would be ok for her sister to come by and have blood work taken if she is being treated for a UTI. Please advise.

## 2011-09-09 NOTE — Telephone Encounter (Signed)
Calling to let us know that pt is having blood work done on Thursday and has a uti.  She is being treated for the uti with an antibiotic (?sulfa)

## 2011-09-12 ENCOUNTER — Other Ambulatory Visit (INDEPENDENT_AMBULATORY_CARE_PROVIDER_SITE_OTHER): Payer: Medicare Other | Admitting: *Deleted

## 2011-09-12 DIAGNOSIS — I059 Rheumatic mitral valve disease, unspecified: Secondary | ICD-10-CM

## 2011-09-12 DIAGNOSIS — I340 Nonrheumatic mitral (valve) insufficiency: Secondary | ICD-10-CM

## 2011-09-12 LAB — CBC WITH DIFFERENTIAL/PLATELET
Eosinophils Relative: 1.3 % (ref 0.0–5.0)
HCT: 36.7 % (ref 36.0–46.0)
Lymphs Abs: 2 10*3/uL (ref 0.7–4.0)
MCV: 88.4 fl (ref 78.0–100.0)
Monocytes Absolute: 0.6 10*3/uL (ref 0.1–1.0)
Neutro Abs: 3.3 10*3/uL (ref 1.4–7.7)
Platelets: 283 10*3/uL (ref 150.0–400.0)
RDW: 13.7 % (ref 11.5–14.6)
WBC: 6 10*3/uL (ref 4.5–10.5)

## 2011-09-12 LAB — PROTIME-INR: Prothrombin Time: 11.1 s (ref 10.2–12.4)

## 2011-09-12 LAB — BASIC METABOLIC PANEL
BUN: 18 mg/dL (ref 6–23)
CO2: 28 mEq/L (ref 19–32)
Chloride: 102 mEq/L (ref 96–112)
Creatinine, Ser: 0.9 mg/dL (ref 0.4–1.2)

## 2011-09-16 ENCOUNTER — Other Ambulatory Visit: Payer: Self-pay | Admitting: *Deleted

## 2011-09-16 MED ORDER — POTASSIUM CHLORIDE ER 10 MEQ PO TBCR: 10.0000 meq | EXTENDED_RELEASE_TABLET | Freq: Every day | ORAL | Status: DC

## 2011-09-17 ENCOUNTER — Other Ambulatory Visit: Payer: Self-pay | Admitting: Cardiology

## 2011-09-17 MED ORDER — SODIUM CHLORIDE 0.9 % IV SOLN: 250.0000 mL | INTRAVENOUS | Status: DC | PRN

## 2011-09-17 MED ORDER — SODIUM CHLORIDE 0.9 % IJ SOLN: 3.0000 mL | Freq: Two times a day (BID) | INTRAMUSCULAR | Status: DC

## 2011-09-17 MED ORDER — SODIUM CHLORIDE 0.9 % IJ SOLN: 3.0000 mL | INTRAMUSCULAR | Status: DC | PRN

## 2011-09-18 ENCOUNTER — Inpatient Hospital Stay (HOSPITAL_BASED_OUTPATIENT_CLINIC_OR_DEPARTMENT_OTHER)
Admission: RE | Admit: 2011-09-18 | Discharge: 2011-09-18 | Disposition: A | Discharge status: Home / Self Care | Payer: Medicare Other | Source: Ambulatory Visit | Attending: Cardiology | Admitting: Cardiology

## 2011-09-18 ENCOUNTER — Encounter (HOSPITAL_BASED_OUTPATIENT_CLINIC_OR_DEPARTMENT_OTHER)
Admission: RE | Disposition: A | Discharge status: Home / Self Care | Payer: Self-pay | Source: Ambulatory Visit | Attending: Cardiology

## 2011-09-18 DIAGNOSIS — I059 Rheumatic mitral valve disease, unspecified: Secondary | ICD-10-CM

## 2011-09-18 SURGERY — JV LEFT AND RIGHT HEART CATHETERIZATION WITH CORONARY ANGIOGRAM
Anesthesia: Moderate Sedation

## 2011-09-18 SURGERY — JV LEFT AND RIGHT HEART CATHETERIZATION WITH CORONARY ANGIOGRAM
Anesthesia: Moderate Sedation | Laterality: Bilateral

## 2011-09-18 MED ORDER — ACETAMINOPHEN 325 MG PO TABS: 650.0000 mg | ORAL_TABLET | ORAL | Status: DC | PRN

## 2011-09-18 MED ORDER — ONDANSETRON HCL 4 MG/2ML IJ SOLN: 4.0000 mg | Freq: Four times a day (QID) | INTRAMUSCULAR | Status: DC | PRN

## 2011-09-18 MED ORDER — SODIUM CHLORIDE 0.9 % IV SOLN: INTRAVENOUS | Status: DC

## 2011-09-18 MED ORDER — ASPIRIN 81 MG PO CHEW
324.0000 mg | CHEWABLE_TABLET | ORAL | Status: AC
  Administered 2011-09-18: 324 mg via ORAL

## 2011-09-18 NOTE — Interval H&P Note (Signed)
History and Physical Interval Note:  09/18/2011 12:27 PM  Michaela [name]  has presented today for surgery, with the diagnosis of mitral regurgitation  The various methods of treatment have been discussed with the patient and family. After consideration of risks, benefits and other options for treatment, the patient has consented to  Procedure(s): JV LEFT AND RIGHT HEART CATHETERIZATION WITH CORONARY ANGIOGRAM as a surgical intervention .  The patients' history has been reviewed, patient examined, no change in status, stable for surgery.  I have reviewed the patients' chart and labs.  Questions were answered to the patient's satisfaction.     Shataria Crist [employer]

## 2011-09-18 NOTE — Progress Notes (Signed)
Bedrest started @ 1330.  Tegaderm dressing applied.

## 2011-09-18 NOTE — H&P (View-Only) (Signed)
PCP: Dr. South  55 yo with history of cerebral astrocytoma s/p resection with residual ataxia and spasticity as well as mitral regurgitation returns for cardiology followup.  Echo recently showed normal LV size and systolic function with bileaflet mitral valve prolapse and probably severe mitral regurgitation.   Patient has poor balance from ataxia and can walk limited distances with a walker but typically uses her wheelchair.  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  She goes to the [employer] twice a week and uses a rowing machine.  She also does yoga. This summer, she noted more shortness of breath at night.  She had episodes consistent with PND.  She has chronically had to sleep on 2 pillows due to a sensation of "smothering."  This summer, she increased this to 3 pillows.  No chest pain.    I did a TEE to more closely assess the valve in 7/12.  This confirmed severe MR with bileaflet prolapse and EF 60%.  I started her on Lasix 20 mg daily (later increased to 20 mg bid), and she reports significant improvement in breathing.  She is no longer having PND episodes and does not have orthopnea.  She continues to be symptomatically stable on Lasix bid.  She has been to see Dr. [name] with [facility], who is willing to repair her mitral valve, preferentially by a right mini-thoracotomy.    Labs (6/12): BNP 88 Labs (7/12): K 3.9, creatinine 0.6, BNP 86 Labs (10/12): K 3.7, creatinine 0.6  PMH:  1. Mitral valve prolapse with mitral regurgitation.  Echo (2/06): Moderate MR.  Echo (6/12): EF 60%, normal LV size, indeterminant diastolic function (suspect pseudonormal), bileaflet mitral valve prolapse with severe mitral regurgitation, moderate LAE, PA systolic pressure 28 mmHg.  TEE (7/12): EF 60%, normal LV size, severe MR with bileaflet prolapse, RV-RA gradient 30 mmHg.  Patient has had CHF symptoms and is now on Lasix.  2. Astrocytoma s/p resection and radiation in 1971.  She was left with residual spasticity  and ataxia and is mainly limited to a wheelchair.  3. Hyperlipidemia 4. Arterial dopplers (2/09): normal  SH: Uses wheelchair but can walk short distances with walker. Lives by herself in [place] but sister and mother help.  Nonsmoker.  Occasional ETOH.   FH: Father with MI at 59  ROS: all systems reviewed and negative except as per HPI.   Current Outpatient Prescriptions  Medication Sig Dispense Refill  . aspirin 81 MG EC tablet Take 81 mg by mouth daily.        . docusate sodium (COLACE) 100 MG capsule Take 200 mg by mouth every evening.        . furosemide (LASIX) 20 MG tablet Take 20 mg by mouth 2 (two) times daily.        . imipramine (TOFRANIL) 25 MG tablet Take 25 mg by mouth at bedtime.        . meloxicam (MOBIC) 15 MG tablet Take 15 mg by mouth as needed.       . polyethylene glycol (MIRALAX / GLYCOLAX) packet Take 17 g by mouth daily.        . potassium chloride (KLOR-CON) 10 MEQ CR tablet Take 10 mEq by mouth daily.        . simvastatin (ZOCOR) 40 MG tablet Take 40 mg by mouth daily.       . Somatropin (NORDITROPIN FLEXPRO) 10 MG/1.5ML SOLN Inject into the skin every evening.        .   tolterodine (DETROL LA) 4 MG 24 hr capsule Take 4 mg by mouth daily.        . zoledronic acid (RECLAST) 5 MG/100ML SOLN Inject 5 mg into the vein once. Once a year         BP 100/59  Pulse 97  Ht 4' 11" (1.499 m)  Wt 39.917 kg (88 lb)  BMI 17.77 kg/m2 General: thin, no distress Neck: No JVD, no thyromegaly or thyroid nodule.  Lungs: Clear to auscultation bilaterally with normal respiratory effort. CV: Nondisplaced PMI.  Heart regular S1/S2, no S3/S4, 3/6 HSM at apex.  No peripheral edema.  No carotid bruit.  Normal pedal pulses.  Abdomen: Soft, nontender, no hepatosplenomegaly, no distention.  Neurologic: Alert and oriented x 3, gait ataxia, resting tremor noted in hands.  Psych: Normal affect. Extremities: No clubbing or cyanosis.  

## 2011-09-18 NOTE — Procedures (Addendum)
Cardiac Catheterization Procedure Note  Name: Michaela [name] MRN: [id number] DOB: 10/17/1955  Procedure: Right Heart Cath, Left Heart Cath, Selective Coronary Angiography, LV angiography  Indication:    Procedural Details: The left groin was prepped, draped, and anesthetized with 1% lidocaine. Using the modified Seldinger technique a 5 French sheath was placed in the left femoral artery and a 7 French sheath was placed in the left femoral vein. A Swan-Ganz catheter was used for the right heart catheterization. Standard protocol was followed for recording of right heart pressures and sampling of oxygen saturations. Fick cardiac output was calculated. Standard Judkins catheters were used for selective coronary angiography and left ventriculography. There were no immediate procedural complications. The patient was transferred to the post catheterization recovery area for further monitoring.  Procedural Findings: Hemodynamics (mmHg) RA mean 12 RV 41/13 PA 40/21, mean 30 PCWP mean 20, v-waves mildly prominent.  LV 114/28 AO 104/86  Oxygen saturations: PA 50% (think arterial sats had dropped when this reading was taken).  AO 98%  Cardiac Output (Fick) 2  Cardiac Index (Fick) 1.5 I suspect CO data is artificially low.  Her oxygen saturations fluctuated a lot, think PA sat was done when arterial sats were in the upper 80s.    Coronary angiography: Coronary dominance: right  Left mainstem: No angiographic CAD  Left anterior descending (LAD): No angiographic CAD  Left circumflex (LCx): No angiographic CAD  Right coronary artery (RCA): Dominant vessel, no angiographic CAD  Left ventriculography: Left ventricular systolic function is normal, LVEF is estimated at 65%, there was 3-4+ MR on LV-gram.  There is a calcified structure on what appears to be the anterior surface of the heart that moves with the heart. It appears to be pericardial, ? Calcified venous structure.  It is separate  from the coronaries.   Final Conclusions:  No angiographic CAD.  Elevated left and right heart filling pressures.  3-4+ MR.  Patient was noted to have small iliac vessels on cath, think that it would be a good idea to more closely size for minimally invasive MV repair with CTA.   Recommendations: MV repair.    [name] 09/18/2011, 1:10 PM

## 2011-09-18 NOTE — Progress Notes (Signed)
Discharge instructions completed with patient, mother, and sister, voiced verbal understanding.    Placed on bedpan to urinate.  Patient is wheelchair restricted, unable to walk.  No bleeding from left groin site.  IV d/c'd.  Discharged to home via wheelchair with mother and sister.

## 2011-09-19 ENCOUNTER — Other Ambulatory Visit: Payer: Self-pay | Admitting: *Deleted

## 2011-09-19 ENCOUNTER — Encounter (HOSPITAL_BASED_OUTPATIENT_CLINIC_OR_DEPARTMENT_OTHER): Payer: Self-pay

## 2011-09-19 DIAGNOSIS — I714 Abdominal aortic aneurysm, without rupture, unspecified: Secondary | ICD-10-CM

## 2011-09-19 DIAGNOSIS — R0602 Shortness of breath: Secondary | ICD-10-CM

## 2011-09-19 DIAGNOSIS — I38 Endocarditis, valve unspecified: Secondary | ICD-10-CM

## 2011-09-19 DIAGNOSIS — I340 Nonrheumatic mitral (valve) insufficiency: Secondary | ICD-10-CM

## 2011-09-19 DIAGNOSIS — Z01810 Encounter for preprocedural cardiovascular examination: Secondary | ICD-10-CM

## 2011-09-20 ENCOUNTER — Telehealth: Payer: Self-pay | Admitting: *Deleted

## 2011-09-20 LAB — POCT I-STAT 3, ART BLOOD GAS (G3+)
Acid-base deficit: 2 mmol/L (ref 0.0–2.0)
O2 Saturation: 98 %
TCO2: 25 mmol/L (ref 0–100)

## 2011-09-20 LAB — POCT I-STAT 3, VENOUS BLOOD GAS (G3P V)
Acid-base deficit: 1 mmol/L (ref 0.0–2.0)
Bicarbonate: 25.8 mEq/L — ABNORMAL HIGH (ref 20.0–24.0)
TCO2: 27 mmol/L (ref 0–100)

## 2011-09-20 NOTE — Telephone Encounter (Signed)
Michaela [name], We need to set Michaela [name] up for CTA of chest/abdomen/pelvis to evaluate aorta and iliac vessels prior to minimally invasive MV repair. Would schedule it for early next week. She also needs appointment to see Dr. Cornelius Moras next week.  Michaela [name] This has been done.

## 2011-09-23 ENCOUNTER — Other Ambulatory Visit: Payer: Self-pay

## 2011-09-23 MED ORDER — FUROSEMIDE 20 MG PO TABS: 20.0000 mg | ORAL_TABLET | Freq: Two times a day (BID) | ORAL | Status: DC

## 2011-09-24 ENCOUNTER — Ambulatory Visit (INDEPENDENT_AMBULATORY_CARE_PROVIDER_SITE_OTHER)
Admission: RE | Admit: 2011-09-24 | Discharge: 2011-09-24 | Disposition: A | Discharge status: Home / Self Care | Payer: Medicare Other | Source: Ambulatory Visit | Attending: Cardiology | Admitting: Cardiology

## 2011-09-24 ENCOUNTER — Telehealth: Payer: Self-pay | Admitting: Cardiology

## 2011-09-24 DIAGNOSIS — R0602 Shortness of breath: Secondary | ICD-10-CM

## 2011-09-24 DIAGNOSIS — I059 Rheumatic mitral valve disease, unspecified: Secondary | ICD-10-CM | POA: Diagnosis not present

## 2011-09-24 DIAGNOSIS — R079 Chest pain, unspecified: Secondary | ICD-10-CM | POA: Diagnosis not present

## 2011-09-24 DIAGNOSIS — I714 Abdominal aortic aneurysm, without rupture, unspecified: Secondary | ICD-10-CM

## 2011-09-24 DIAGNOSIS — I38 Endocarditis, valve unspecified: Secondary | ICD-10-CM | POA: Diagnosis not present

## 2011-09-24 DIAGNOSIS — Z01810 Encounter for preprocedural cardiovascular examination: Secondary | ICD-10-CM

## 2011-09-24 DIAGNOSIS — I340 Nonrheumatic mitral (valve) insufficiency: Secondary | ICD-10-CM

## 2011-09-24 MED ORDER — IOHEXOL 350 MG/ML SOLN
80.0000 mL | Freq: Once | INTRAVENOUS | Status: AC | PRN
  Administered 2011-09-24: 80 mL via INTRAVENOUS

## 2011-09-24 NOTE — Telephone Encounter (Signed)
Walk In pt Form " Questions Reguarding Surgery, call Bobetta Lime (sister)" sent to Thurston Hole L  09/24/11/KM

## 2011-09-25 ENCOUNTER — Telehealth: Payer: Self-pay | Admitting: Cardiology

## 2011-09-25 NOTE — Telephone Encounter (Signed)
New problem Pt wants test results called to her sister. She also wanted to know does she need an neurological workup because of shaking before procedure. She has coordination issues. Please call

## 2011-09-25 NOTE — Telephone Encounter (Signed)
Spoke with pt sister and advised that the final results of Ms. Amadon' test have not yet been posted and that Dr. Alford Highland nurse would call her with the results and advise as to whether or not Ms. Labreck needs a neuro consult next week.

## 2011-09-26 NOTE — Telephone Encounter (Signed)
Discussed results with pt's sister,[name]. She will ask Dr Cornelius Moras at time of appt next week if he thinks neuro consult is necessary prior to surgery.

## 2011-10-02 ENCOUNTER — Ambulatory Visit: Payer: Medicare Other | Admitting: Thoracic Surgery (Cardiothoracic Vascular Surgery)

## 2011-10-04 ENCOUNTER — Encounter: Payer: Self-pay | Admitting: Thoracic Surgery (Cardiothoracic Vascular Surgery)

## 2011-10-04 ENCOUNTER — Other Ambulatory Visit: Payer: Self-pay | Admitting: Thoracic Surgery (Cardiothoracic Vascular Surgery)

## 2011-10-04 ENCOUNTER — Encounter (HOSPITAL_COMMUNITY): Payer: Self-pay | Admitting: Pharmacy Technician

## 2011-10-04 ENCOUNTER — Other Ambulatory Visit: Payer: Self-pay

## 2011-10-04 ENCOUNTER — Ambulatory Visit (INDEPENDENT_AMBULATORY_CARE_PROVIDER_SITE_OTHER): Payer: Medicare Other | Admitting: Thoracic Surgery (Cardiothoracic Vascular Surgery)

## 2011-10-04 VITALS — BP 97/64 | HR 97 | Resp 16 | Ht <= 58 in | Wt 83.0 lb

## 2011-10-04 DIAGNOSIS — I059 Rheumatic mitral valve disease, unspecified: Secondary | ICD-10-CM

## 2011-10-04 DIAGNOSIS — I340 Nonrheumatic mitral (valve) insufficiency: Secondary | ICD-10-CM

## 2011-10-04 MED ORDER — AMIODARONE HCL 200 MG PO TABS: 100.0000 mg | ORAL_TABLET | Freq: Two times a day (BID) | ORAL | Status: DC

## 2011-10-04 NOTE — Patient Instructions (Signed)
Start amiodarone

## 2011-10-04 NOTE — Progress Notes (Signed)
[address]            [name] [address]          [phone]     CARDIOTHORACIC SURGERY OFFICE NOTE  Referring Provider is [name], MD PCP is Julian Hy, MD, MD   HPI:  Patient returns for follow-up of severe mitral regurgitation.  She was originally seen in consultation on 07/08/2011 and a full consultation note was generated at that time. The patient has remained clinically stable. She underwent elective left and right heart catheterization by Dr. Shirlee Latch 09/18/2011. She did not have any coronary artery disease. She did have elevated left and right heart filling pressures as well as at least moderate to severe mitral regurgitation. She now returns to consider elective mitral valve repair. She reports that over the last few months she has remained clinically stable. She still has chronic shortness of breath.  The remainder of her review of systems is unchanged from previously.   Current Outpatient Prescriptions  Medication Sig Dispense Refill  . aspirin 81 MG EC tablet Take 81 mg by mouth daily.        Marland Kitchen docusate sodium (COLACE) 100 MG capsule Take 200 mg by mouth every evening.        . furosemide (LASIX) 20 MG tablet Take 1 tablet (20 mg total) by mouth 2 (two) times daily.  60 tablet  2  . imipramine (TOFRANIL) 25 MG tablet Take 25 mg by mouth at bedtime.        . meloxicam (MOBIC) 15 MG tablet Take 15 mg by mouth as needed.       . polyethylene glycol (MIRALAX / GLYCOLAX) packet Take 17 g by mouth daily.        . potassium chloride (KLOR-CON 10) 10 MEQ tablet Take 1 tablet (10 mEq total) by mouth daily.  30 tablet  6  . simvastatin (ZOCOR) 40 MG tablet Take 40 mg by mouth daily.       . Somatropin (NORDITROPIN FLEXPRO) 10 MG/1.5ML SOLN Inject into the skin every evening.        . tolterodine (DETROL LA) 4 MG 24 hr capsule Take 4 mg by mouth daily.        . zoledronic acid (RECLAST) 5 MG/100ML SOLN Inject 5 mg into the vein once.  Once a year       . amiodarone (PACERONE) 200 MG tablet Take 0.5 tablets (100 mg total) by mouth 2 (two) times daily. Begin 7 days prior to surgery.  14 tablet  0   No current facility-administered medications for this visit.   Facility-Administered Medications Ordered in Other Visits  Medication Dose Route Frequency Provider Last Rate Last Dose  . 0.9 %  sodium chloride infusion  250 mL Intravenous PRN [name], MD      . sodium chloride 0.9 % injection 3 mL  3 mL Intravenous Q12H [name], MD      . sodium chloride 0.9 % injection 3 mL  3 mL Intravenous PRN [name], MD          Physical Exam:   BP 97/64  Pulse 97  Resp 16  Ht 4\' 10"  (1.473 m)  Wt 83 lb (37.649 kg)  BMI 17.35 kg/m2  SpO2 99%  General:  Thin, frail  Chest:   clear  CV:   RRR with prominent III/VI holosystolic murmur  Abdomen:  soft  Extremities:  warm   Diagnostic Tests:  Cardiac Catheterization Procedure Note  Name: Michaela [name]  MRN: [id number]  DOB: 08-27-1956  Procedure: Right Heart Cath, Left Heart Cath, Selective Coronary Angiography, LV angiography  Indication:  Procedural Details: The left groin was prepped, draped, and anesthetized with 1% lidocaine. Using the modified Seldinger technique a 5 French sheath was placed in the left femoral artery and a 7 French sheath was placed in the left femoral vein. A Swan-Ganz catheter was used for the right heart catheterization. Standard protocol was followed for recording of right heart pressures and sampling of oxygen saturations. Fick cardiac output was calculated. Standard Judkins catheters were used for selective coronary angiography and left ventriculography. There were no immediate procedural complications. The patient was transferred to the post catheterization recovery area for further monitoring.  Procedural Findings:  Hemodynamics (mmHg)  RA mean 12  RV 41/13  PA 40/21, mean 30  PCWP mean 20, v-waves mildly prominent.  LV  114/28  AO 104/86  Oxygen saturations:  PA 50% (think arterial sats had dropped when this reading was taken).  AO 98%  Cardiac Output (Fick) 2  Cardiac Index (Fick) 1.5  I suspect CO data is artificially low. Her oxygen saturations fluctuated a lot, think PA sat was done when arterial sats were in the upper 80s.  Coronary angiography:  Coronary dominance: right  Left mainstem: No angiographic CAD  Left anterior descending (LAD): No angiographic CAD  Left circumflex (LCx): No angiographic CAD  Right coronary artery (RCA): Dominant vessel, no angiographic CAD  Left ventriculography: Left ventricular systolic function is normal, LVEF is estimated at 65%, there was 3-4+ MR on LV-gram. There is a calcified structure on what appears to be the anterior surface of the heart that moves with the heart. It appears to be pericardial, ? Calcified venous structure. It is separate from the coronaries.   CT ANGIOGRAPHY CHEST, ABDOMEN AND PELVIS  Technique: Multidetector CT imaging through the chest, abdomen and  pelvis was performed using the standard protocol during bolus  administration of intravenous contrast. Multiplanar reconstructed  images including MIPs were obtained and reviewed to evaluate the  vascular anatomy.  Contrast: 80mL OMNIPAQUE IOHEXOL 350 MG/ML IV SOLN,  Comparison: None.  CTA CHEST  Findings: Heart is mildly enlarged. Aorta is normal caliber.  Ascending aorta measures 2.0 cm. Descending aorta measures 2.0 cm.  No dissection. No filling defects in the pulmonary arteries to  suggest pulmonary emboli.  Minimal dependent atelectasis. Lungs otherwise clear. No  effusions. No mediastinal, hilar, or axillary adenopathy.  Visualized thyroid and chest wall soft tissues unremarkable.  Review of the MIP images confirms the above findings.  IMPRESSION:  No acute findings. No evidence of aortic aneurysm or dissection.  CTA ABDOMEN AND PELVIS  Findings: Scattered atherosclerotic  calcifications in the  infrarenal aorta. No aneurysm or dissection. Maximum diameter of  the aorta is noted proximally, measuring 1.7 cm. Common iliac  arteries, external iliac arteries, common femoral arteries are  patent, non-aneurysmal and unremarkable. Single renal arteries  bilaterally. No evidence of renal artery stenosis. Celiac artery,  superior mesenteric artery, inferior mesenteric artery widely  patent.  There is a ventriculoperitoneal shunt catheter noted in the right  anterior chest wall and abdominal wall. The tip ends in the  anterior midline of the pelvis. There is a long catheter fragment  seen within the pelvis compatible with fractured portion of a  ventriculoperitoneal shunt catheter.  Liver, spleen, gallbladder,  pancreas, adrenals and kidneys are  unremarkable. Small amount of free fluid in the pelvis, possibly  related to the VP shunt. Moderate stool burden throughout the  colon. Small bowel decompressed. No free air or adenopathy.  Review of the MIP images confirms the above findings.  IMPRESSION:  No evidence of aortic aneurysm or dissection. Scattered  atherosclerotic calcifications in the aorta and iliac vessels.  Ventriculoperitoneal shunt catheter as above. A long fragment of  the catheter has broken off and coils in the pelvis.  Large stool burden in the colon.   Impression:  Long-standing mitral valve prolapse with severe mitral regurgitation and symptoms of shortness of breath and orthopnea. The patient has normal left ventricular systolic function and no significant coronary artery disease.  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     Plan:  The rationale for elective mitral valve repair surgery has been explained, including a comparison between surgery and  continued medical therapy with close follow-up.  The likelihood of successful and durable valve repair has been discussed with particular reference to the findings of their recent echocardiogram.  Based upon these findings and previous experience, I have quoted them a greater than 80 percent likelihood of successful valve repair.  In the unlikely event that their valve cannot be successfully repaired, we discussed the possibility of replacing the mitral valve using a mechanical prosthesis with the attendant need for long-term anticoagulation versus the alternative of replacing it using a bioprosthetic tissue valve with its potential for late structural valve deterioration and failure, depending upon the patient's longevity.  The patient specifically requests that if the mitral valve must be replaced that it be done using a mechanical valve.  Alternative surgical approaches have been discussed, including a comparison between conventional sternotomy and minimally-invasive techniques.  The relative risks and benefits of each have been reviewed as they pertain to the patient's specific circumstances, and all of their questions have been addressed. The patient understands and accepts all potential associated risks of surgery including but not limited to risk of death, stroke, myocardial infarction, congestive heart failure, respiratory failure, renal failure, bleeding requiring blood transfusion and/or reexploration, possible need for conversion to extended thoracotomy or median sternotomy, arrhythmia, heart block or bradycardia requiring permanent pacemaker, pneumonia, pleural effusion, chronic pain or hernia formation or other complications peculiar to mini thoracotomy incision, pulmonary embolus or other thromboembolic complication, aortic dissection or other vascular complications related to femoral artery cannulation, wound infection or lymphocele or nerve injury related to groin incision, or delayed complications  related to mitral valve repair such as recurrent mitral regurgitation or infection.   All questions have been answered.     Salvatore Decent. Cornelius Moras, MD 10/04/2011 4:51 PM

## 2011-10-07 ENCOUNTER — Telehealth: Payer: Self-pay | Admitting: Cardiology

## 2011-10-07 MED ORDER — HYDROMORPHONE HCL PF 1 MG/ML IJ SOLN
INTRAMUSCULAR | Status: AC
  Filled 2011-10-07: qty 1

## 2011-10-07 NOTE — Telephone Encounter (Signed)
New Problem   Patient requests call regarding medication concerns, she can be reached at hm#

## 2011-10-07 NOTE — Telephone Encounter (Signed)
I talked with pt. Pt states that she has only been taking lasix 20mg  one daily instead of lasix 20mg  two daily. She states she has been taking one daily for several months. I reviewed with Dr Shirlee Latch. Pt to continue to take lasix 20mg  daily. Pt states she is scheduled for surgery 10/10/11.

## 2011-10-08 ENCOUNTER — Other Ambulatory Visit (HOSPITAL_COMMUNITY): Payer: Medicare Other

## 2011-10-08 ENCOUNTER — Ambulatory Visit (HOSPITAL_COMMUNITY): Payer: Medicare Other

## 2011-10-08 ENCOUNTER — Encounter (HOSPITAL_COMMUNITY): Payer: Medicare Other

## 2011-10-11 ENCOUNTER — Encounter (HOSPITAL_COMMUNITY)
Admission: RE | Admit: 2011-10-11 | Discharge: 2011-10-11 | Disposition: A | Discharge status: Home / Self Care | Payer: Medicare Other | Source: Ambulatory Visit | Attending: Thoracic Surgery (Cardiothoracic Vascular Surgery) | Admitting: Thoracic Surgery (Cardiothoracic Vascular Surgery)

## 2011-10-11 ENCOUNTER — Encounter (HOSPITAL_COMMUNITY): Payer: Self-pay

## 2011-10-11 ENCOUNTER — Ambulatory Visit (HOSPITAL_COMMUNITY)
Admission: RE | Admit: 2011-10-11 | Discharge: 2011-10-11 | Disposition: A | Discharge status: Home / Self Care | Payer: Medicare Other | Source: Ambulatory Visit | Attending: Thoracic Surgery (Cardiothoracic Vascular Surgery) | Admitting: Thoracic Surgery (Cardiothoracic Vascular Surgery)

## 2011-10-11 ENCOUNTER — Inpatient Hospital Stay (HOSPITAL_COMMUNITY)
Admission: RE | Admit: 2011-10-11 | Discharge: 2011-10-11 | Disposition: A | Discharge status: Home / Self Care | Payer: Medicare Other | Source: Ambulatory Visit | Attending: Thoracic Surgery (Cardiothoracic Vascular Surgery) | Admitting: Thoracic Surgery (Cardiothoracic Vascular Surgery)

## 2011-10-11 ENCOUNTER — Other Ambulatory Visit: Payer: Self-pay

## 2011-10-11 ENCOUNTER — Encounter (HOSPITAL_COMMUNITY)
Admission: RE | Admit: 2011-10-11 | Discharge: 2011-10-11 | Disposition: A | Discharge status: Home / Self Care | Payer: Medicare Other | Source: Ambulatory Visit

## 2011-10-11 DIAGNOSIS — I059 Rheumatic mitral valve disease, unspecified: Secondary | ICD-10-CM | POA: Insufficient documentation

## 2011-10-11 DIAGNOSIS — K929 Disease of digestive system, unspecified: Secondary | ICD-10-CM | POA: Diagnosis not present

## 2011-10-11 DIAGNOSIS — D62 Acute posthemorrhagic anemia: Secondary | ICD-10-CM | POA: Diagnosis not present

## 2011-10-11 DIAGNOSIS — Z01812 Encounter for preprocedural laboratory examination: Secondary | ICD-10-CM | POA: Insufficient documentation

## 2011-10-11 DIAGNOSIS — R0602 Shortness of breath: Secondary | ICD-10-CM | POA: Diagnosis not present

## 2011-10-11 DIAGNOSIS — I340 Nonrheumatic mitral (valve) insufficiency: Secondary | ICD-10-CM

## 2011-10-11 DIAGNOSIS — Z01810 Encounter for preprocedural cardiovascular examination: Secondary | ICD-10-CM

## 2011-10-11 DIAGNOSIS — K560 Paralytic ileus: Secondary | ICD-10-CM | POA: Diagnosis not present

## 2011-10-11 DIAGNOSIS — Z01811 Encounter for preprocedural respiratory examination: Secondary | ICD-10-CM | POA: Diagnosis not present

## 2011-10-11 DIAGNOSIS — R011 Cardiac murmur, unspecified: Secondary | ICD-10-CM | POA: Diagnosis not present

## 2011-10-11 DIAGNOSIS — Z01818 Encounter for other preprocedural examination: Secondary | ICD-10-CM | POA: Insufficient documentation

## 2011-10-11 HISTORY — DX: Overactive bladder: N32.81

## 2011-10-11 HISTORY — DX: Nonrheumatic mitral (valve) insufficiency: I34.0

## 2011-10-11 HISTORY — DX: Myoneural disorder, unspecified: G70.9

## 2011-10-11 LAB — COMPREHENSIVE METABOLIC PANEL
AST: 18 U/L (ref 0–37)
CO2: 28 mEq/L (ref 19–32)
Calcium: 9.9 mg/dL (ref 8.4–10.5)
Creatinine, Ser: 0.56 mg/dL (ref 0.50–1.10)
GFR calc Af Amer: >90 mL/min (ref >90)
GFR calc non Af Amer: >90 mL/min (ref >90)
Total Protein: 6.9 g/dL (ref 6.0–8.3)

## 2011-10-11 LAB — URINALYSIS, ROUTINE W REFLEX MICROSCOPIC
Ketones, ur: NEGATIVE mg/dL
Nitrite: NEGATIVE
Specific Gravity, Urine: 1.013 (ref 1.005–1.030)
pH: 7 (ref 5.0–8.0)

## 2011-10-11 LAB — URINE MICROSCOPIC-ADD ON

## 2011-10-11 LAB — PULMONARY FUNCTION TEST

## 2011-10-11 LAB — BLOOD GAS, ARTERIAL
Acid-Base Excess: 5.1 mmol/L — ABNORMAL HIGH (ref 0.0–2.0)
Drawn by: 206361
FIO2: 0.21 %
O2 Saturation: 97.5 %
Patient temperature: 98.6

## 2011-10-11 LAB — CBC
MCH: 29.2 pg (ref 26.0–34.0)
MCHC: 32.9 g/dL (ref 30.0–36.0)
MCV: 88.9 fL (ref 78.0–100.0)
Platelets: 253 10*3/uL (ref 150–400)
RBC: 4.24 MIL/uL (ref 3.87–5.11)
RDW: 13.5 % (ref 11.5–15.5)

## 2011-10-11 LAB — SURGICAL PCR SCREEN: Staphylococcus aureus: NEGATIVE

## 2011-10-11 LAB — PROTIME-INR: Prothrombin Time: 13.3 seconds (ref 11.6–15.2)

## 2011-10-11 LAB — APTT: aPTT: 26 seconds (ref 24–37)

## 2011-10-11 LAB — HEMOGLOBIN A1C: Mean Plasma Glucose: 111 mg/dL (ref <117)

## 2011-10-11 NOTE — Pre-Procedure Instructions (Addendum)
20 [place] [name]  10/11/2011   Your procedure is scheduled on:  Tuesday, February 5  Report to [facility] at 5:30 AM.  Call this number if you have problems the morning of surgery: [phone]   Remember:   Do not eat food:After Midnight.  May have clear liquids: up to 4 Hours before arrival.  Clear liquids include soda, tea, black coffee, apple or grape juice, broth.  Take these medicines the morning of surgery with A SIP OF WATER: Amiodarone, Detrol   Do not wear jewelry, make-up or nail polish.  Do not wear lotions, powders, or perfumes. You may wear deodorant.  Do not shave 48 hours prior to surgery.  Do not bring valuables to the hospital.  Contacts, dentures or bridgework may not be worn into surgery.  Leave suitcase in the car. After surgery it may be brought to your room.  For patients admitted to the hospital, checkout time is 11:00 AM the day of discharge.   Patients discharged the day of surgery will not be allowed to drive home.  Name and phone number of your driver: NA  Special Instructions: Incentive Spirometry - Practice and bring it with you on the day of surgery. and CHG Shower Use Special Wash: 1/2 bottle night before surgery and 1/2 bottle morning of surgery.   Please read over the following fact sheets that you were given: Pain Booklet, Coughing and Deep Breathing, Blood Transfusion Information, Open Heart Packet and Surgical Site Infection Prevention

## 2011-10-11 NOTE — Progress Notes (Signed)
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 Comparisons in chart.

## 2011-10-11 NOTE — Progress Notes (Signed)
Pre-op Cardiac Surgery  Carotid Findings:  No significant ICA stenosis bilaterally.  Upper Extremity Right Left  Brachial Pressures 109 115  Radial Waveforms triphasic triphasic  Ulnar Waveforms triphasic triphasic  [place] (Allen's Test) normal normal   Findings:   normal  [employer], RVT

## 2011-10-14 MED ORDER — NITROGLYCERIN 5 MG/ML IV SOLN
INTRAVENOUS | Status: DC
  Filled 2011-10-14: qty 2.5

## 2011-10-14 MED ORDER — VANCOMYCIN HCL 1000 MG IV SOLR
1000.0000 mg | INTRAVENOUS | Status: DC
  Filled 2011-10-14: qty 1000

## 2011-10-14 MED ORDER — PHENYLEPHRINE HCL 10 MG/ML IJ SOLN
30.0000 ug/min | INTRAVENOUS | Status: DC
  Filled 2011-10-14: qty 2

## 2011-10-14 MED ORDER — SODIUM CHLORIDE 0.9 % IV SOLN
0.1000 ug/kg/h | INTRAVENOUS | Status: DC
  Filled 2011-10-14: qty 4

## 2011-10-14 MED ORDER — METOPROLOL TARTRATE 12.5 MG HALF TABLET
12.5000 mg | ORAL_TABLET | Freq: Once | ORAL | Status: AC
  Administered 2011-10-15: 12.5 mg via ORAL
  Filled 2011-10-14: qty 1

## 2011-10-14 MED ORDER — DOPAMINE-DEXTROSE 3.2-5 MG/ML-% IV SOLN
2.0000 ug/kg/min | INTRAVENOUS | Status: DC
  Filled 2011-10-14: qty 250

## 2011-10-14 MED ORDER — NITROGLYCERIN IN D5W 200-5 MCG/ML-% IV SOLN
2.0000 ug/min | INTRAVENOUS | Status: DC
  Filled 2011-10-14: qty 250

## 2011-10-14 MED ORDER — DEXTROSE 5 % IV SOLN
1.5000 g | INTRAVENOUS | Status: DC
  Filled 2011-10-14: qty 1.5

## 2011-10-14 MED ORDER — EPINEPHRINE HCL 1 MG/ML IJ SOLN
0.5000 ug/min | INTRAVENOUS | Status: DC
  Filled 2011-10-14: qty 4

## 2011-10-14 MED ORDER — POTASSIUM CHLORIDE 2 MEQ/ML IV SOLN
80.0000 meq | INTRAVENOUS | Status: DC
  Filled 2011-10-14: qty 40

## 2011-10-14 MED ORDER — DEXTROSE 5 % IV SOLN
750.0000 mg | INTRAVENOUS | Status: DC
  Filled 2011-10-14: qty 750

## 2011-10-14 MED ORDER — MAGNESIUM SULFATE 50 % IJ SOLN
40.0000 meq | INTRAMUSCULAR | Status: DC
  Filled 2011-10-14: qty 10

## 2011-10-14 MED ORDER — CHLORHEXIDINE GLUCONATE 4 % EX LIQD: 30.0000 mL | CUTANEOUS | Status: DC

## 2011-10-14 MED ORDER — SODIUM CHLORIDE 0.9 % IV SOLN
INTRAVENOUS | Status: DC
  Filled 2011-10-14: qty 1

## 2011-10-14 MED ORDER — SODIUM CHLORIDE 0.9 % IV SOLN
INTRAVENOUS | Status: DC
  Filled 2011-10-14: qty 40

## 2011-10-14 NOTE — H&P (Signed)
CARDIOTHORACIC SURGERY HISTORY AND PHYSICAL EXAM  PCP is Michaela Hy, MD Referring Provider is [name], MD    Chief Complaint   Patient presents with   .  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 The patient also had a ventriculoperitoneal shunt for hydrocephalus at the time. She has managed remarkably well under the circumstances despite the fact that she has been left with chronic severe problems with instability of gait and spasticity. Despite this she lives alone and cares for herself. She is for the most part confined to wheelchair although she seems to get around remarkably well under the circumstances. She reports that when she was a teenager she was told that she had mitral valve prolapse. Approximately 12 years ago she was noted to have a prominent murmur on exam and she was referred for cardiology consultation. For years she had been followed by Dr. Deborah Chalk, and more recently she has been followed by Dr. Shirlee Latch. She describes a long history of orthopnea with episodes of paroxysmal nocturnal dyspnea. Some of these symptoms are associated with what the family terms "night terrors" but the patient reports that her primary problem is a sensation smothering or shortness of breath when she lays flat in bed. These symptoms are relieved by sitting up. More recently the symptoms have progressed and she is also developed some lower extremity edema. She underwent transesophageal echocardiogram in July of this year confirming the presence of mitral valve prolapse with severe mitral regurgitation. She was started on oral Lasix therapy at that time. Symptoms have improved somewhat on Lasix therapy, but the patient still had some associated orthopnea and shortness of  breath. She was referred to consider elective mitral valve repair.   Past Medical History  Diagnosis Date  . 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 MVP (mitral valve prolapse)   . Mitral regurgitation   . Shortness of breath   . Heart murmur   . Neuromuscular disorder     numbness in hands and feet  . Arthritis   . Overactive bladder     Past Surgical History  Procedure Date  . Ventriculoperitoneal shunt 1971  . Knee arthroscopy     right  . Cardiac catheterization     Jan 2013  . Transesophageal echocardiogram 7/12  . US echocardiography 6/12    No family history on file.  Social History History  Substance Use Topics  . Smoking status: Former Smoker    Quit date: 09/09/1990  . Smokeless tobacco: Not on file  . Alcohol Use: Yes    No current facility-administered medications for this encounter.   Current Outpatient Prescriptions  Medication Sig Dispense Refill  . amiodarone (PACERONE) 200 MG tablet Take 100 mg by mouth 2 (two) times daily. Begin 7 days prior to surgery.      Marland Kitchen aspirin 81 MG EC tablet Take 81 mg by mouth daily.       Marland Kitchen docusate sodium (COLACE) 100 MG capsule Take 200 mg by mouth every evening.        Marland Kitchen imipramine (TOFRANIL) 25 MG tablet Take 25 mg by mouth at bedtime.       . meloxicam (MOBIC) 15 MG tablet  Take 15 mg by mouth daily as needed. For inflammation      . polyethylene glycol (MIRALAX / GLYCOLAX) packet Take 17 g by mouth daily.        . potassium chloride (KLOR-CON 10) 10 MEQ tablet Take 1 tablet (10 mEq total) by mouth daily.  30 tablet  6  . simvastatin (ZOCOR) 40 MG tablet Take 40 mg by mouth daily.       . Somatropin (NORDITROPIN FLEXPRO) 10 MG/1.5ML SOLN Inject 1.5 mLs into the skin every evening.       . tolterodine (DETROL LA) 4 MG 24 hr capsule Take 4 mg by mouth daily.       . zoledronic acid (RECLAST) 5 MG/100ML SOLN Inject 5 mg into the vein once. Once a year      . furosemide (LASIX) 20 MG tablet Take 1  tablet (20 mg total) by mouth daily.       Facility-Administered Medications Ordered in Other Encounters  Medication Dose Route Frequency Provider Last Rate Last Dose  . 0.9 %  sodium chloride infusion  250 mL Intravenous PRN [name], MD      . sodium chloride 0.9 % injection 3 mL  3 mL Intravenous Q12H [name], MD      . sodium chloride 0.9 % injection 3 mL  3 mL Intravenous PRN [name], MD        Allergies  Allergen Reactions  . Compazine     Review of Systems:  Constitutional: Negative for fever, chills, diaphoresis, activity change, appetite change, fatigue and unexpected weight change.  HENT: Negative.   Eyes: Negative.   Respiratory: Positive for shortness of breath. Negative for cough and chest tightness.   Cardiovascular: Positive for palpitations and leg swelling. Negative for chest pain.  Gastrointestinal: Negative.         Chronic constipation with some reported increase "pressure" recently.  Genitourinary:       Neurogenic bladder with spasticity  Musculoskeletal: Positive for gait problem. Negative for back pain, joint swelling and arthralgias.  Neurological: Positive for tremors.        Severe instability with gait with lack of balance  Hematological: Negative.   Psychiatric/Behavioral: Negative.     Physical Exam: BP 115/66  Pulse 90  Resp 20  Ht 5' (1.524 m)  Wt 84 lb (38.102 kg)  BMI 16.41 kg/m2  SpO2 100% Physical Exam  Vitals reviewed. Constitutional: She is oriented to person, place, and time.       Very thin and frail appearing, height 5'0" and weight 84 lbs.  Eyes: Pupils are equal, round, and reactive to light.  Neck: Neck supple. No JVD present. No thyromegaly present.       Palpable V-P shunt on right neck  Cardiovascular: Normal rate and regular rhythm.    Murmur heard.      Holosystolic murmur all across the precordium  Pulmonary/Chest: Breath sounds normal. No respiratory distress. She has no rales.       Palpable V-P  shunt along medial right anterior chest through subcutaneous tissues  Abdominal: Soft. Bowel sounds are normal. She exhibits no mass. There is no tenderness.  Musculoskeletal: She exhibits no edema.  Lymphadenopathy:    She has no cervical adenopathy.  Neurological: She is alert and oriented to person, place, and time. She displays abnormal reflex. She exhibits abnormal muscle tone. Coordination abnormal.      Good strength but very poor coordination  Skin: Skin is warm and dry.  Psychiatric: She has a normal mood and affect. Her behavior is normal. Judgment and thought content normal.     Diagnostic Tests:  Transesophageal echocardiogram performed 03/18/2011 is reviewed. 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 There is severe (4+) mitral regurgitation. The jet of regurgitation is central and directed slightly posteriorly. The left atrium is dilated. Left ventricular systolic function is normal. The aortic valve is normal. There is trivial tricuspid regurgitation. The mitral valve looked repairable, although there is considerable thickening of the leaflets. There does not appear to be much subvalvular calcification  Cardiac Catheterization Procedure Note  Name: GENE COLEE   MRN: [id number]   DOB: [birth year]   Procedure: Right Heart Cath, Left Heart Cath, Selective Coronary Angiography, LV angiography   Indication:   Procedural Details: The left groin was prepped, draped, and anesthetized with 1% lidocaine. Using the modified Seldinger technique a 5 French sheath was placed in the left femoral artery and a 7 French sheath was placed in the left femoral vein. A Swan-Ganz catheter was used for the right heart catheterization. Standard protocol was followed for recording of right heart pressures and sampling of oxygen saturations. Fick cardiac output was calculated. Standard Judkins catheters  were used for selective coronary angiography and left ventriculography. There were no immediate procedural complications. The patient was transferred to the post catheterization recovery area for further monitoring.   Procedural Findings:   Hemodynamics (mmHg)   RA mean 12   RV 41/13   PA 40/21, mean 30   PCWP mean 20, v-waves mildly prominent.   LV 114/28   AO 104/86   Oxygen saturations:   PA 50% (think arterial sats had dropped when this reading was taken).   AO 98%   Cardiac Output (Fick) 2   Cardiac Index (Fick) 1.5   I suspect CO data is artificially low. Her oxygen saturations fluctuated a lot, think PA sat was done when arterial sats were in the upper 80s.   Coronary angiography:   Coronary dominance: right   Left mainstem: No angiographic CAD   Left anterior descending (LAD): No angiographic CAD   Left circumflex (LCx): No angiographic CAD   Right coronary artery (RCA): Dominant vessel, no angiographic CAD   Left ventriculography: Left ventricular systolic function is normal, LVEF is estimated at 65%, there was 3-4+ MR on LV-gram. There is a calcified structure on what appears to be the anterior surface of the heart that moves with the heart. It appears to be pericardial, ? Calcified venous structure. It is separate from the coronaries.   CT ANGIOGRAPHY CHEST, ABDOMEN AND PELVIS  Technique: Multidetector CT imaging through the chest, abdomen and   pelvis was performed using the standard protocol during bolus   administration of intravenous contrast. Multiplanar reconstructed   images including MIPs were obtained and reviewed to evaluate the   vascular anatomy.   Contrast: 80mL OMNIPAQUE IOHEXOL 350 MG/ML IV SOLN,   Comparison: None.   CTA CHEST   Findings: Heart is mildly enlarged. Aorta is normal caliber.   Ascending aorta measures 2.0 cm. Descending aorta measures 2.0 cm.   No dissection. No filling defects in the pulmonary arteries to   suggest pulmonary emboli.     Minimal dependent atelectasis. Lungs otherwise clear. No   effusions. No mediastinal, hilar, or axillary adenopathy.   Visualized thyroid and chest wall soft tissues unremarkable.   Review of the MIP images confirms the above  findings.   IMPRESSION:   No acute findings. No evidence of aortic aneurysm or dissection.   CTA ABDOMEN AND PELVIS   Findings: Scattered atherosclerotic calcifications in the   infrarenal aorta. No aneurysm or dissection. Maximum diameter of   the aorta is noted proximally, measuring 1.7 cm. Common iliac   arteries, external iliac arteries, common femoral arteries are   patent, non-aneurysmal and unremarkable. Single renal arteries   bilaterally. No evidence of renal artery stenosis. Celiac artery,   superior mesenteric artery, inferior mesenteric artery widely   patent.   There is a ventriculoperitoneal shunt catheter noted in the right   anterior chest wall and abdominal wall. The tip ends in the   anterior midline of the pelvis. There is a long catheter fragment   seen within the pelvis compatible with fractured portion of a   ventriculoperitoneal shunt catheter.   Liver, spleen, gallbladder, pancreas, adrenals and kidneys are   unremarkable. Small amount of free fluid in the pelvis, possibly   related to the VP shunt. Moderate stool burden throughout the   colon. Small bowel decompressed. No free air or adenopathy.   Review of the MIP images confirms the above findings.   IMPRESSION:   No evidence of aortic aneurysm or dissection. Scattered   atherosclerotic calcifications in the aorta and iliac vessels.   Ventriculoperitoneal shunt catheter as above. A long fragment of   the catheter has broken off and coils in the pelvis.   Large stool burden in the colon.   Impression:  Long-standing mitral valve prolapse with severe mitral regurgitation and symptoms of shortness of breath and orthopnea. The patient has normal left ventricular systolic function and  no significant coronary artery disease.  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     Plan:  The rationale for elective mitral valve repair surgery has been explained, including a comparison between surgery and continued medical therapy with close follow-up.  The likelihood of successful and durable valve repair has been discussed with particular reference to the findings of their recent echocardiogram.  Based upon these findings and previous experience, I have quoted them a greater than 80 percent likelihood of successful valve repair.  In the unlikely event that their valve cannot be successfully repaired, we discussed the possibility of replacing the mitral valve using a mechanical prosthesis with the attendant need for long-term anticoagulation versus the alternative of replacing it using a bioprosthetic tissue valve with its potential for late structural valve deterioration and failure, depending upon the patient's longevity.  The patient specifically requests that if the mitral valve must be replaced that it be done using a mechanical valve.  Alternative surgical approaches have been discussed, including a comparison between conventional sternotomy and minimally-invasive techniques.  The relative risks and benefits of each have been reviewed as they pertain to the patient's specific circumstances, and all of their questions have been addressed. The patient understands and accepts all potential associated risks of surgery including but not limited to risk of death, stroke, myocardial infarction, congestive heart failure, respiratory failure, renal failure, bleeding requiring blood transfusion and/or reexploration, possible need for conversion to extended thoracotomy or median sternotomy, arrhythmia, heart block or  bradycardia requiring permanent pacemaker, pneumonia, pleural effusion, chronic pain or hernia formation or other complications peculiar to mini thoracotomy incision, pulmonary embolus or other thromboembolic complication,  aortic dissection or other vascular complications related to femoral artery cannulation, wound infection or lymphocele or nerve injury related to groin incision, or delayed complications related to mitral valve repair such as recurrent mitral regurgitation or infection.   All questions have been answered.     Salvatore Decent. Cornelius Moras, MD

## 2011-10-14 NOTE — Consult Note (Signed)
Anesthesia:  56 year old female for MVR for MVP with severe MR on 10/15/11. Other history includes brain tumor, arthritis, and former smoker.  Labs, CXR, EKG noted.  Recent cardiac work-up.  Cardiologist is Dr. Shirlee Latch. 2D Echo done on 03/04/11 (see Results Review tab).  Also had TEE done on 03/18/11 that showed normal LV systolic function, EF 60%, severe MR, LA moderately dilated.  Cath on 09/18/11 (see Notes Tab, Procedures) showed no angiographic CAD. Elevated left and right heart filling pressures. 3-4+ MR. Patient was noted to have small iliac vessels on cath, minimally invasive MV repair with CTA was recommended.  Plan to proceed.

## 2011-10-15 ENCOUNTER — Encounter (HOSPITAL_COMMUNITY): Payer: Self-pay | Admitting: *Deleted

## 2011-10-15 ENCOUNTER — Inpatient Hospital Stay (HOSPITAL_COMMUNITY)
Admission: RE | Admit: 2011-10-15 | Discharge: 2011-10-29 | DRG: 220 | Disposition: A | Discharge status: Skilled Nursing Facility | Payer: Medicare Other | Source: Ambulatory Visit | Attending: Thoracic Surgery (Cardiothoracic Vascular Surgery) | Admitting: Thoracic Surgery (Cardiothoracic Vascular Surgery)

## 2011-10-15 ENCOUNTER — Inpatient Hospital Stay (HOSPITAL_COMMUNITY): Payer: Medicare Other

## 2011-10-15 ENCOUNTER — Inpatient Hospital Stay (HOSPITAL_COMMUNITY): Payer: Medicare Other | Admitting: Vascular Surgery

## 2011-10-15 ENCOUNTER — Encounter (HOSPITAL_COMMUNITY)
Admission: RE | Disposition: A | Discharge status: Skilled Nursing Facility | Payer: Self-pay | Source: Ambulatory Visit | Attending: Thoracic Surgery (Cardiothoracic Vascular Surgery)

## 2011-10-15 ENCOUNTER — Encounter (HOSPITAL_COMMUNITY): Payer: Self-pay | Admitting: Vascular Surgery

## 2011-10-15 ENCOUNTER — Encounter (HOSPITAL_COMMUNITY): Payer: Self-pay | Admitting: Thoracic Surgery (Cardiothoracic Vascular Surgery)

## 2011-10-15 DIAGNOSIS — N319 Neuromuscular dysfunction of bladder, unspecified: Secondary | ICD-10-CM | POA: Diagnosis present

## 2011-10-15 DIAGNOSIS — Z993 Dependence on wheelchair: Secondary | ICD-10-CM

## 2011-10-15 DIAGNOSIS — K929 Disease of digestive system, unspecified: Secondary | ICD-10-CM | POA: Diagnosis not present

## 2011-10-15 DIAGNOSIS — B965 Pseudomonas (aeruginosa) (mallei) (pseudomallei) as the cause of diseases classified elsewhere: Secondary | ICD-10-CM | POA: Diagnosis present

## 2011-10-15 DIAGNOSIS — Z982 Presence of cerebrospinal fluid drainage device: Secondary | ICD-10-CM

## 2011-10-15 DIAGNOSIS — Z9889 Other specified postprocedural states: Secondary | ICD-10-CM

## 2011-10-15 DIAGNOSIS — J90 Pleural effusion, not elsewhere classified: Secondary | ICD-10-CM | POA: Diagnosis not present

## 2011-10-15 DIAGNOSIS — Z85841 Personal history of malignant neoplasm of brain: Secondary | ICD-10-CM | POA: Diagnosis not present

## 2011-10-15 DIAGNOSIS — E8779 Other fluid overload: Secondary | ICD-10-CM | POA: Diagnosis not present

## 2011-10-15 DIAGNOSIS — M6281 Muscle weakness (generalized): Secondary | ICD-10-CM | POA: Diagnosis not present

## 2011-10-15 DIAGNOSIS — Z79899 Other long term (current) drug therapy: Secondary | ICD-10-CM | POA: Diagnosis not present

## 2011-10-15 DIAGNOSIS — Z87891 Personal history of nicotine dependence: Secondary | ICD-10-CM

## 2011-10-15 DIAGNOSIS — D62 Acute posthemorrhagic anemia: Secondary | ICD-10-CM | POA: Diagnosis not present

## 2011-10-15 DIAGNOSIS — N390 Urinary tract infection, site not specified: Secondary | ICD-10-CM | POA: Diagnosis present

## 2011-10-15 DIAGNOSIS — Z09 Encounter for follow-up examination after completed treatment for conditions other than malignant neoplasm: Secondary | ICD-10-CM | POA: Diagnosis not present

## 2011-10-15 DIAGNOSIS — I059 Rheumatic mitral valve disease, unspecified: Principal | ICD-10-CM

## 2011-10-15 DIAGNOSIS — D696 Thrombocytopenia, unspecified: Secondary | ICD-10-CM | POA: Diagnosis not present

## 2011-10-15 DIAGNOSIS — M129 Arthropathy, unspecified: Secondary | ICD-10-CM | POA: Diagnosis not present

## 2011-10-15 DIAGNOSIS — R269 Unspecified abnormalities of gait and mobility: Secondary | ICD-10-CM | POA: Diagnosis present

## 2011-10-15 DIAGNOSIS — C719 Malignant neoplasm of brain, unspecified: Secondary | ICD-10-CM | POA: Insufficient documentation

## 2011-10-15 DIAGNOSIS — G709 Myoneural disorder, unspecified: Secondary | ICD-10-CM | POA: Insufficient documentation

## 2011-10-15 DIAGNOSIS — J9819 Other pulmonary collapse: Secondary | ICD-10-CM | POA: Diagnosis not present

## 2011-10-15 DIAGNOSIS — R0602 Shortness of breath: Secondary | ICD-10-CM | POA: Diagnosis not present

## 2011-10-15 DIAGNOSIS — Z7901 Long term (current) use of anticoagulants: Secondary | ICD-10-CM | POA: Diagnosis not present

## 2011-10-15 DIAGNOSIS — K560 Paralytic ileus: Secondary | ICD-10-CM | POA: Diagnosis not present

## 2011-10-15 DIAGNOSIS — Y834 Other reconstructive surgery as the cause of abnormal reaction of the patient, or of later complication, without mention of misadventure at the time of the procedure: Secondary | ICD-10-CM | POA: Diagnosis not present

## 2011-10-15 DIAGNOSIS — D432 Neoplasm of uncertain behavior of brain, unspecified: Secondary | ICD-10-CM | POA: Diagnosis not present

## 2011-10-15 DIAGNOSIS — M1990 Unspecified osteoarthritis, unspecified site: Secondary | ICD-10-CM | POA: Insufficient documentation

## 2011-10-15 DIAGNOSIS — Z5189 Encounter for other specified aftercare: Secondary | ICD-10-CM | POA: Diagnosis not present

## 2011-10-15 DIAGNOSIS — Y9210 Unspecified residential institution as the place of occurrence of the external cause: Secondary | ICD-10-CM | POA: Diagnosis not present

## 2011-10-15 DIAGNOSIS — R918 Other nonspecific abnormal finding of lung field: Secondary | ICD-10-CM | POA: Diagnosis not present

## 2011-10-15 DIAGNOSIS — J984 Other disorders of lung: Secondary | ICD-10-CM | POA: Diagnosis not present

## 2011-10-15 DIAGNOSIS — Z954 Presence of other heart-valve replacement: Secondary | ICD-10-CM | POA: Diagnosis not present

## 2011-10-15 HISTORY — PX: MITRAL VALVE REPAIR: SHX2039

## 2011-10-15 LAB — CBC
HCT: 22.7 % — ABNORMAL LOW (ref 36.0–46.0)
Hemoglobin: 7.8 g/dL — ABNORMAL LOW (ref 12.0–15.0)
Hemoglobin: 10.8 g/dL — ABNORMAL LOW (ref 12.0–15.0)
MCH: 30.3 pg (ref 26.0–34.0)
MCHC: 34.6 g/dL (ref 30.0–36.0)
Platelets: 136 10*3/uL — ABNORMAL LOW (ref 150–400)
RBC: 3.57 MIL/uL — ABNORMAL LOW (ref 3.87–5.11)
WBC: 9.2 10*3/uL (ref 4.0–10.5)

## 2011-10-15 LAB — POCT I-STAT 4, (NA,K, GLUC, HGB,HCT)
Glucose, Bld: 108 mg/dL — ABNORMAL HIGH (ref 70–99)
Glucose, Bld: 116 mg/dL — ABNORMAL HIGH (ref 70–99)
Glucose, Bld: 119 mg/dL — ABNORMAL HIGH (ref 70–99)
Glucose, Bld: 111 mg/dL — ABNORMAL HIGH (ref 70–99)
HCT: 31 % — ABNORMAL LOW (ref 36.0–46.0)
HCT: 29 % — ABNORMAL LOW (ref 36.0–46.0)
HCT: 23 % — ABNORMAL LOW (ref 36.0–46.0)
HCT: 24 % — ABNORMAL LOW (ref 36.0–46.0)
Hemoglobin: 10.5 g/dL — ABNORMAL LOW (ref 12.0–15.0)
Hemoglobin: 9.9 g/dL — ABNORMAL LOW (ref 12.0–15.0)
Hemoglobin: 9.5 g/dL — ABNORMAL LOW (ref 12.0–15.0)
Hemoglobin: 7.8 g/dL — ABNORMAL LOW (ref 12.0–15.0)
Hemoglobin: 8.2 g/dL — ABNORMAL LOW (ref 12.0–15.0)
Potassium: 3.5 mEq/L (ref 3.5–5.1)
Potassium: 3.6 mEq/L (ref 3.5–5.1)
Sodium: 140 mEq/L (ref 135–145)
Sodium: 136 mEq/L (ref 135–145)
Sodium: 139 mEq/L (ref 135–145)
Sodium: 142 mEq/L (ref 135–145)

## 2011-10-15 LAB — GLUCOSE, CAPILLARY: Glucose-Capillary: 87 mg/dL (ref 70–99)

## 2011-10-15 LAB — POCT I-STAT 3, ART BLOOD GAS (G3+)
Acid-base deficit: 2 mmol/L (ref 0.0–2.0)
Bicarbonate: 24.5 mEq/L — ABNORMAL HIGH (ref 20.0–24.0)
O2 Saturation: 100 %
O2 Saturation: 98 %
TCO2: 26 mmol/L (ref 0–100)
TCO2: 24 mmol/L (ref 0–100)
pCO2 arterial: 50.3 mmHg — ABNORMAL HIGH (ref 35.0–45.0)
pCO2 arterial: 41 mmHg (ref 35.0–45.0)
pCO2 arterial: 37.7 mmHg (ref 35.0–45.0)
pCO2 arterial: 41.7 mmHg (ref 35.0–45.0)
pH, Arterial: 7.371 (ref 7.350–7.400)
pO2, Arterial: 336 mmHg — ABNORMAL HIGH (ref 80.0–100.0)
pO2, Arterial: 47 mmHg — ABNORMAL LOW (ref 80.0–100.0)
pO2, Arterial: 194 mmHg — ABNORMAL HIGH (ref 80.0–100.0)
pO2, Arterial: 121 mmHg — ABNORMAL HIGH (ref 80.0–100.0)

## 2011-10-15 LAB — POCT I-STAT, CHEM 8
BUN: 16 mg/dL (ref 6–23)
Calcium, Ion: 0.98 mmol/L — ABNORMAL LOW (ref 1.12–1.32)
Chloride: 108 meq/L (ref 96–112)
Creatinine, Ser: 0.5 mg/dL (ref 0.50–1.10)
Glucose, Bld: 181 mg/dL — ABNORMAL HIGH (ref 70–99)
HCT: 28 % — ABNORMAL LOW (ref 36.0–46.0)
Hemoglobin: 9.5 g/dL — ABNORMAL LOW (ref 12.0–15.0)
Potassium: 5 meq/L (ref 3.5–5.1)
Sodium: 137 meq/L (ref 135–145)
TCO2: 23 mmol/L (ref 0–100)

## 2011-10-15 LAB — CREATININE, SERUM
Creatinine, Ser: 0.45 mg/dL — ABNORMAL LOW (ref 0.50–1.10)
GFR calc non Af Amer: >90 mL/min (ref >90)

## 2011-10-15 LAB — MAGNESIUM: Magnesium: 3.4 mg/dL — ABNORMAL HIGH (ref 1.5–2.5)

## 2011-10-15 LAB — HEMOGLOBIN AND HEMATOCRIT, BLOOD: Hemoglobin: 9.4 g/dL — ABNORMAL LOW (ref 12.0–15.0)

## 2011-10-15 LAB — PROTIME-INR: INR: 1.5 — ABNORMAL HIGH (ref 0.00–1.49)

## 2011-10-15 SURGERY — REPAIR, MITRAL VALVE, MINIMALLY INVASIVE
Anesthesia: General | Site: Chest | Laterality: Right | Wound class: Clean

## 2011-10-15 MED ORDER — CALCIUM CHLORIDE 10 % IV SOLN
1.0000 g | Freq: Once | INTRAVENOUS | Status: AC | PRN
  Filled 2011-10-15: qty 10

## 2011-10-15 MED ORDER — IMIPRAMINE HCL 25 MG PO TABS
25.0000 mg | ORAL_TABLET | Freq: Every day | ORAL | Status: DC
  Administered 2011-10-16 – 2011-10-28 (×13): 25 mg via ORAL
  Filled 2011-10-15 (×15): qty 1

## 2011-10-15 MED ORDER — PROTAMINE SULFATE 10 MG/ML IV SOLN
INTRAVENOUS | Status: DC | PRN
  Administered 2011-10-15: 30 mg via INTRAVENOUS
  Administered 2011-10-15: 10 mg via INTRAVENOUS
  Administered 2011-10-15: 40 mg via INTRAVENOUS
  Administered 2011-10-15 (×2): 30 mg via INTRAVENOUS

## 2011-10-15 MED ORDER — OXYCODONE HCL 5 MG PO TABS
5.0000 mg | ORAL_TABLET | ORAL | Status: DC | PRN
  Administered 2011-10-16 (×2): 5 mg via ORAL
  Filled 2011-10-15 (×2): qty 1

## 2011-10-15 MED ORDER — FAMOTIDINE IN NACL 20-0.9 MG/50ML-% IV SOLN
20.0000 mg | Freq: Two times a day (BID) | INTRAVENOUS | Status: AC
  Administered 2011-10-15: 20 mg via INTRAVENOUS
  Filled 2011-10-15: qty 50

## 2011-10-15 MED ORDER — SODIUM CHLORIDE 0.9 % IV SOLN: INTRAVENOUS | Status: DC

## 2011-10-15 MED ORDER — PROPOFOL 10 MG/ML IV EMUL
INTRAVENOUS | Status: DC | PRN
  Administered 2011-10-15: 70 mg via INTRAVENOUS

## 2011-10-15 MED ORDER — ACETAMINOPHEN 650 MG RE SUPP: 650.0000 mg | RECTAL | Status: AC

## 2011-10-15 MED ORDER — POTASSIUM CHLORIDE 10 MEQ/50ML IV SOLN
10.0000 meq | INTRAVENOUS | Status: AC
  Administered 2011-10-15 (×3): 10 meq via INTRAVENOUS

## 2011-10-15 MED ORDER — ACETAMINOPHEN 160 MG/5ML PO SOLN
975.0000 mg | Freq: Four times a day (QID) | ORAL | Status: AC
  Filled 2011-10-15: qty 40.6

## 2011-10-15 MED ORDER — SODIUM CHLORIDE 0.9 % IJ SOLN
3.0000 mL | Freq: Two times a day (BID) | INTRAMUSCULAR | Status: DC
  Administered 2011-10-16 – 2011-10-17 (×4): 3 mL via INTRAVENOUS

## 2011-10-15 MED ORDER — SODIUM CHLORIDE 0.9 % IV SOLN
0.1000 ug/kg/h | INTRAVENOUS | Status: DC
  Filled 2011-10-15: qty 2

## 2011-10-15 MED ORDER — MORPHINE SULFATE 4 MG/ML IJ SOLN
2.0000 mg | INTRAMUSCULAR | Status: DC | PRN
  Administered 2011-10-16 – 2011-10-17 (×3): 2 mg via INTRAVENOUS
  Filled 2011-10-15 (×2): qty 1

## 2011-10-15 MED ORDER — DOPAMINE-DEXTROSE 3.2-5 MG/ML-% IV SOLN: 0.0000 ug/kg/min | INTRAVENOUS | Status: DC

## 2011-10-15 MED ORDER — SODIUM CHLORIDE 0.9 % IV SOLN
100.0000 [IU] | INTRAVENOUS | Status: DC | PRN
  Administered 2011-10-15: 1 [IU]/h via INTRAVENOUS

## 2011-10-15 MED ORDER — VANCOMYCIN HCL 1000 MG IV SOLR
1000.0000 mg | Freq: Once | INTRAVENOUS | Status: AC
  Administered 2011-10-15: 1000 mg via INTRAVENOUS
  Filled 2011-10-15: qty 1000

## 2011-10-15 MED ORDER — METOPROLOL TARTRATE 25 MG/10 ML ORAL SUSPENSION
12.5000 mg | Freq: Two times a day (BID) | ORAL | Status: DC
  Filled 2011-10-15 (×7): qty 5

## 2011-10-15 MED ORDER — SODIUM CHLORIDE 0.9 % IJ SOLN: 3.0000 mL | INTRAMUSCULAR | Status: DC | PRN

## 2011-10-15 MED ORDER — BISACODYL 10 MG RE SUPP: 10.0000 mg | Freq: Every day | RECTAL | Status: DC

## 2011-10-15 MED ORDER — DEXTROSE 5 % IV SOLN
INTRAVENOUS | Status: DC | PRN
  Administered 2011-10-15 (×2): via INTRAVENOUS

## 2011-10-15 MED ORDER — SODIUM CHLORIDE 0.9 % IV SOLN: 250.0000 mL | INTRAVENOUS | Status: DC

## 2011-10-15 MED ORDER — SODIUM CHLORIDE 0.9 % IR SOLN
Status: DC | PRN
  Administered 2011-10-15: 10:00:00

## 2011-10-15 MED ORDER — ALBUMIN HUMAN 5 % IV SOLN
250.0000 mL | INTRAVENOUS | Status: AC | PRN
  Administered 2011-10-16: 250 mL via INTRAVENOUS

## 2011-10-15 MED ORDER — EPINEPHRINE HCL 0.1 MG/ML IJ SOLN
INTRAMUSCULAR | Status: DC | PRN
  Administered 2011-10-15: 0.2 mg via INTRAVENOUS

## 2011-10-15 MED ORDER — ASPIRIN EC 325 MG PO TBEC
325.0000 mg | DELAYED_RELEASE_TABLET | Freq: Every day | ORAL | Status: DC
  Administered 2011-10-16 – 2011-10-17 (×2): 325 mg via ORAL
  Filled 2011-10-15 (×3): qty 1

## 2011-10-15 MED ORDER — PANTOPRAZOLE SODIUM 40 MG PO TBEC
40.0000 mg | DELAYED_RELEASE_TABLET | Freq: Every day | ORAL | Status: DC
Start: 2011-10-17 — End: 2011-10-18
  Administered 2011-10-17: 40 mg via ORAL
  Filled 2011-10-15: qty 1

## 2011-10-15 MED ORDER — DOPAMINE-DEXTROSE 3.2-5 MG/ML-% IV SOLN
INTRAVENOUS | Status: DC | PRN
  Administered 2011-10-15: 5 ug/kg/min via INTRAVENOUS

## 2011-10-15 MED ORDER — CALCIUM CHLORIDE 10 % IV SOLN
INTRAVENOUS | Status: DC | PRN
  Administered 2011-10-15: .25 g via INTRAVENOUS

## 2011-10-15 MED ORDER — ALBUTEROL SULFATE (2.5 MG/3ML) 0.083% IN NEBU
INHALATION_SOLUTION | RESPIRATORY_TRACT | Status: DC | PRN
  Administered 2011-10-15: 2.5 mg via RESPIRATORY_TRACT

## 2011-10-15 MED ORDER — MIDAZOLAM HCL 5 MG/5ML IJ SOLN
INTRAMUSCULAR | Status: DC | PRN
  Administered 2011-10-15: 2 mg via INTRAVENOUS
  Administered 2011-10-15: 4 mg via INTRAVENOUS
  Administered 2011-10-15 (×3): 2 mg via INTRAVENOUS

## 2011-10-15 MED ORDER — NITROGLYCERIN IN D5W 200-5 MCG/ML-% IV SOLN
INTRAVENOUS | Status: DC | PRN
  Administered 2011-10-15: 5 ug/min via INTRAVENOUS

## 2011-10-15 MED ORDER — ASPIRIN 81 MG PO CHEW: 324.0000 mg | CHEWABLE_TABLET | Freq: Every day | ORAL | Status: DC

## 2011-10-15 MED ORDER — SODIUM BICARBONATE 8.4 % IV SOLN
INTRAVENOUS | Status: DC | PRN
  Administered 2011-10-15: 50 mL via INTRAVENOUS

## 2011-10-15 MED ORDER — PHENYLEPHRINE HCL 10 MG/ML IJ SOLN
20.0000 mg | INTRAMUSCULAR | Status: DC | PRN
  Administered 2011-10-15: 10 ug/min via INTRAVENOUS

## 2011-10-15 MED ORDER — ACETAMINOPHEN 500 MG PO TABS
1000.0000 mg | ORAL_TABLET | Freq: Four times a day (QID) | ORAL | Status: AC
  Administered 2011-10-16 – 2011-10-20 (×18): 1000 mg via ORAL
  Filled 2011-10-15 (×21): qty 2

## 2011-10-15 MED ORDER — GLUTARALDEHYDE 0.625% SOAKING SOLUTION
TOPICAL | Status: DC | PRN
  Filled 2011-10-15: qty 50

## 2011-10-15 MED ORDER — ONDANSETRON HCL 4 MG/2ML IJ SOLN
4.0000 mg | Freq: Four times a day (QID) | INTRAMUSCULAR | Status: DC | PRN
  Administered 2011-10-16 – 2011-10-17 (×2): 4 mg via INTRAVENOUS
  Filled 2011-10-15 (×2): qty 2

## 2011-10-15 MED ORDER — SOMATROPIN 10 MG/1.5ML ~~LOC~~ SOLN: 10.0000 mg | Freq: Every day | SUBCUTANEOUS | Status: DC

## 2011-10-15 MED ORDER — MAGNESIUM SULFATE 40 MG/ML IJ SOLN
4.0000 g | Freq: Once | INTRAMUSCULAR | Status: AC
  Administered 2011-10-15: 4 g via INTRAVENOUS
  Filled 2011-10-15: qty 100

## 2011-10-15 MED ORDER — MORPHINE SULFATE 2 MG/ML IJ SOLN: 1.0000 mg | INTRAMUSCULAR | Status: AC | PRN

## 2011-10-15 MED ORDER — BISACODYL 5 MG PO TBEC
10.0000 mg | DELAYED_RELEASE_TABLET | Freq: Every day | ORAL | Status: DC
  Administered 2011-10-16 – 2011-10-29 (×11): 10 mg via ORAL
  Filled 2011-10-15 (×10): qty 2

## 2011-10-15 MED ORDER — LACTATED RINGERS IV SOLN
INTRAVENOUS | Status: DC
  Administered 2011-10-17: 17:00:00 via INTRAVENOUS

## 2011-10-15 MED ORDER — 0.9 % SODIUM CHLORIDE (POUR BTL) OPTIME
TOPICAL | Status: DC | PRN
  Administered 2011-10-15: 6000 mL

## 2011-10-15 MED ORDER — DOCUSATE SODIUM 100 MG PO CAPS
200.0000 mg | ORAL_CAPSULE | Freq: Every day | ORAL | Status: DC
  Administered 2011-10-16 – 2011-10-17 (×2): 200 mg via ORAL
  Filled 2011-10-15 (×2): qty 2

## 2011-10-15 MED ORDER — SOMATROPIN 10 MG/1.5ML ~~LOC~~ SOLN: 1.5000 mL | Freq: Every evening | SUBCUTANEOUS | Status: DC

## 2011-10-15 MED ORDER — DEXTROSE 5 % IV SOLN
1.5000 g | Freq: Two times a day (BID) | INTRAVENOUS | Status: AC
  Administered 2011-10-15 – 2011-10-17 (×4): 1.5 g via INTRAVENOUS
  Filled 2011-10-15 (×4): qty 1.5

## 2011-10-15 MED ORDER — SODIUM CHLORIDE 0.9 % IV SOLN
INTRAVENOUS | Status: DC | PRN
  Administered 2011-10-15: 15:00:00 via INTRAVENOUS

## 2011-10-15 MED ORDER — LIDOCAINE HCL (CARDIAC) 20 MG/ML IV SOLN
INTRAVENOUS | Status: DC | PRN
  Administered 2011-10-15: 60 mg via INTRAVENOUS

## 2011-10-15 MED ORDER — METHYLPREDNISOLONE SODIUM SUCC 125 MG IJ SOLR
125.0000 mg | Freq: Four times a day (QID) | INTRAMUSCULAR | Status: AC
  Administered 2011-10-15 – 2011-10-16 (×3): 125 mg via INTRAVENOUS
  Filled 2011-10-15 (×2): qty 2

## 2011-10-15 MED ORDER — MIDAZOLAM HCL 2 MG/2ML IJ SOLN: 2.0000 mg | INTRAMUSCULAR | Status: DC | PRN

## 2011-10-15 MED ORDER — FENTANYL CITRATE 0.05 MG/ML IJ SOLN
INTRAMUSCULAR | Status: DC | PRN
  Administered 2011-10-15 (×3): 250 ug via INTRAVENOUS
  Administered 2011-10-15: 100 ug via INTRAVENOUS

## 2011-10-15 MED ORDER — SODIUM CHLORIDE 0.9 % IV SOLN
200.0000 ug | INTRAVENOUS | Status: DC | PRN
  Administered 2011-10-15: 0.2 ug/kg/h via INTRAVENOUS

## 2011-10-15 MED ORDER — NITROGLYCERIN IN D5W 200-5 MCG/ML-% IV SOLN: 0.0000 ug/min | INTRAVENOUS | Status: DC

## 2011-10-15 MED ORDER — METOPROLOL TARTRATE 1 MG/ML IV SOLN: 2.5000 mg | INTRAVENOUS | Status: DC | PRN

## 2011-10-15 MED ORDER — ACETAMINOPHEN 160 MG/5ML PO SOLN
650.0000 mg | ORAL | Status: AC
  Administered 2011-10-15: 650 mg

## 2011-10-15 MED ORDER — METOPROLOL TARTRATE 12.5 MG HALF TABLET
12.5000 mg | ORAL_TABLET | Freq: Two times a day (BID) | ORAL | Status: DC
  Filled 2011-10-15 (×7): qty 1

## 2011-10-15 MED ORDER — PHENYLEPHRINE HCL 10 MG/ML IJ SOLN
0.0000 ug/min | INTRAVENOUS | Status: DC
  Administered 2011-10-15: 55 ug/min via INTRAVENOUS
  Administered 2011-10-16: 45 ug/min via INTRAVENOUS
  Administered 2011-10-16: 60 ug/min via INTRAVENOUS
  Administered 2011-10-16: 55 ug/min via INTRAVENOUS
  Administered 2011-10-16: 60 ug/min via INTRAVENOUS
  Administered 2011-10-17: 35 ug/min via INTRAVENOUS
  Filled 2011-10-15 (×6): qty 2

## 2011-10-15 MED ORDER — ALBUMIN HUMAN 5 % IV SOLN
INTRAVENOUS | Status: DC | PRN
  Administered 2011-10-15 (×2): via INTRAVENOUS

## 2011-10-15 MED ORDER — POTASSIUM CHLORIDE 10 MEQ/50ML IV SOLN
10.0000 meq | INTRAVENOUS | Status: AC
  Administered 2011-10-15 (×2): 10 meq via INTRAVENOUS

## 2011-10-15 MED ORDER — SODIUM CHLORIDE 0.45 % IV SOLN: INTRAVENOUS | Status: DC

## 2011-10-15 MED ORDER — LACTATED RINGERS IV SOLN
INTRAVENOUS | Status: DC | PRN
  Administered 2011-10-15 (×2): via INTRAVENOUS

## 2011-10-15 MED ORDER — SODIUM CHLORIDE 0.9 % IV SOLN
10.0000 g | INTRAVENOUS | Status: DC | PRN
  Administered 2011-10-15: 5 g/h via INTRAVENOUS

## 2011-10-15 MED ORDER — ROCURONIUM BROMIDE 100 MG/10ML IV SOLN
INTRAVENOUS | Status: DC | PRN
  Administered 2011-10-15 (×3): 30 mg via INTRAVENOUS
  Administered 2011-10-15: 70 mg via INTRAVENOUS

## 2011-10-15 MED ORDER — SOMATROPIN 10 MG/1.5ML ~~LOC~~ SOLN
10.0000 mg | Freq: Every day | SUBCUTANEOUS | Status: DC
  Filled 2011-10-15: qty 1.5

## 2011-10-15 MED ORDER — CEFUROXIME SODIUM 1.5 G IJ SOLR
0.5000 g | INTRAMUSCULAR | Status: DC | PRN
  Administered 2011-10-15: 1.5 g via INTRAVENOUS

## 2011-10-15 MED ORDER — VANCOMYCIN HCL 1000 MG IV SOLR
1000.0000 mg | INTRAVENOUS | Status: DC | PRN
  Administered 2011-10-15: 1000 mg via INTRAVENOUS

## 2011-10-15 MED ORDER — PHENYLEPHRINE HCL 10 MG/ML IJ SOLN
20.0000 mg | INTRAVENOUS | Status: DC | PRN
  Administered 2011-10-15: 13.3 ug/min via INTRAVENOUS

## 2011-10-15 MED ORDER — INSULIN REGULAR BOLUS VIA INFUSION: 0.0000 [IU] | Freq: Three times a day (TID) | INTRAVENOUS | Status: DC

## 2011-10-15 MED ORDER — HEPARIN SODIUM (PORCINE) 1000 UNIT/ML IJ SOLN
INTRAMUSCULAR | Status: DC | PRN
  Administered 2011-10-15: 12000 [IU] via INTRAVENOUS

## 2011-10-15 MED ORDER — INSULIN ASPART 100 UNIT/ML ~~LOC~~ SOLN
0.0000 [IU] | SUBCUTANEOUS | Status: DC
  Administered 2011-10-15 – 2011-10-16 (×5): 2 [IU] via SUBCUTANEOUS
  Filled 2011-10-15: qty 3

## 2011-10-15 MED ORDER — METHYLPREDNISOLONE SODIUM SUCC 125 MG IJ SOLR
INTRAMUSCULAR | Status: DC | PRN
  Administered 2011-10-15: 250 mg via INTRAVENOUS
  Administered 2011-10-15 (×2): 125 mg via INTRAVENOUS

## 2011-10-15 MED ORDER — LACTATED RINGERS IV SOLN
INTRAVENOUS | Status: DC | PRN
  Administered 2011-10-15 (×3): via INTRAVENOUS

## 2011-10-15 SURGICAL SUPPLY — 116 items
ADAPTER CARDIO PERF ANTE/RETRO (ADAPTER) ×2 IMPLANT
ATTRACTOMAT 16X20 MAGNETIC DRP (DRAPES) ×2 IMPLANT
BAG DECANTER FOR FLEXI CONT (MISCELLANEOUS) ×2 IMPLANT
BENZOIN TINCTURE PRP APPL 2/3 (GAUZE/BANDAGES/DRESSINGS) ×2 IMPLANT
BLADE STERNUM SYSTEM 6 (BLADE) ×2 IMPLANT
BLADE SURG 11 STRL SS (BLADE) ×2 IMPLANT
CANISTER SUCTION 2500CC (MISCELLANEOUS) ×4 IMPLANT
CANNULA FEM VENOUS REMOTE 22FR (CANNULA) ×2 IMPLANT
CANNULA FEMORAL ART 14 SM (MISCELLANEOUS) ×2 IMPLANT
CANNULA GUNDRY RCSP 15FR (MISCELLANEOUS) ×2 IMPLANT
CANNULA OPTISITE PERFUSION 16F (CANNULA) ×2 IMPLANT
CANNULA OPTISITE PERFUSION 18F (CANNULA) IMPLANT
CARDIAC SUCTION (MISCELLANEOUS) ×2 IMPLANT
CLOTH BEACON ORANGE TIMEOUT ST (SAFETY) ×2 IMPLANT
CONN ST 1/4X3/8  BEN (MISCELLANEOUS) ×2
CONN ST 1/4X3/8 BEN (MISCELLANEOUS) ×2 IMPLANT
CONT SPEC STER OR (MISCELLANEOUS) ×2 IMPLANT
COVER MAYO STAND STRL (DRAPES) ×2 IMPLANT
COVER PROBE W GEL 5X96 (DRAPES) ×2 IMPLANT
COVER SURGICAL LIGHT HANDLE (MISCELLANEOUS) ×4 IMPLANT
CRADLE DONUT ADULT HEAD (MISCELLANEOUS) ×2 IMPLANT
DERMABOND ADVANCED (GAUZE/BANDAGES/DRESSINGS) ×2
DERMABOND ADVANCED .7 DNX12 (GAUZE/BANDAGES/DRESSINGS) ×2 IMPLANT
DEVICE PMI PUNCTURE CLOSURE (MISCELLANEOUS) ×2 IMPLANT
DEVICE TROCAR PUNCTURE CLOSURE (ENDOMECHANICALS) ×2 IMPLANT
DRAIN CHANNEL 28F RND 3/8 FF (WOUND CARE) ×4 IMPLANT
DRAPE BILATERAL SPLIT (DRAPES) ×2 IMPLANT
DRAPE C-ARM 42X72 X-RAY (DRAPES) ×2 IMPLANT
DRAPE CV SPLIT W-CLR ANES SCRN (DRAPES) ×2 IMPLANT
DRAPE INCISE IOBAN 66X45 STRL (DRAPES) ×4 IMPLANT
DRAPE SLUSH MACHINE 52X66 (DRAPES) IMPLANT
DRAPE SLUSH/WARMER DISC (DRAPES) ×2 IMPLANT
DRSG COVADERM 4X8 (GAUZE/BANDAGES/DRESSINGS) ×2 IMPLANT
ELECT BLADE 6.5 EXT (BLADE) ×2 IMPLANT
ELECT REM PT RETURN 9FT ADLT (ELECTROSURGICAL) ×4
ELECTRODE REM PT RTRN 9FT ADLT (ELECTROSURGICAL) ×2 IMPLANT
FEMORAL VENOUS CANN RAP (CANNULA) IMPLANT
GLOVE ORTHO TXT STRL SZ7.5 (GLOVE) ×10 IMPLANT
GOWN STRL NON-REIN LRG LVL3 (GOWN DISPOSABLE) ×8 IMPLANT
GUIDEWIRE ANG ZIPWIRE 038X150 (WIRE) ×2 IMPLANT
INSERT CONFORM CROSS CLAMP 66M (MISCELLANEOUS) ×2 IMPLANT
INSERT CONFORM CROSS CLAMP 86M (MISCELLANEOUS) ×2 IMPLANT
KIT BASIN OR (CUSTOM PROCEDURE TRAY) ×2 IMPLANT
KIT DILATOR VASC 18G NDL (KITS) ×2 IMPLANT
KIT DRAINAGE VACCUM ASSIST (KITS) ×2 IMPLANT
KIT ROOM TURNOVER OR (KITS) ×2 IMPLANT
KIT SUCTION CATH 14FR (SUCTIONS) ×2 IMPLANT
LEAD PACING MYOCARDI (MISCELLANEOUS) ×2 IMPLANT
LINE VENT (MISCELLANEOUS) ×2 IMPLANT
NEEDLE AORTIC ROOT 14G 7F (CATHETERS) ×2 IMPLANT
NS IRRIG 1000ML POUR BTL (IV SOLUTION) ×10 IMPLANT
PACK OPEN HEART (CUSTOM PROCEDURE TRAY) ×2 IMPLANT
PAD ARMBOARD 7.5X6 YLW CONV (MISCELLANEOUS) ×4 IMPLANT
PAD ELECT DEFIB RADIOL ZOLL (MISCELLANEOUS) ×2 IMPLANT
RETRACTOR PVM SOFT TISSUE M (INSTRUMENTS) ×2 IMPLANT
RETRACTOR TRL SOFT TISSUE LG (INSTRUMENTS) IMPLANT
RETRACTOR TRM SOFT TISSUE 7.5 (INSTRUMENTS) IMPLANT
RING MITRAL MEMO 3D 36MM SMD36 (Prosthesis & Implant Heart) ×2 IMPLANT
SET CANNULATION TOURNIQUET (MISCELLANEOUS) ×2 IMPLANT
SET CARDIOPLEGIA MPS 5001102 (MISCELLANEOUS) ×2 IMPLANT
SET IRRIG TUBING LAPAROSCOPIC (IRRIGATION / IRRIGATOR) ×2 IMPLANT
SOLUTION ANTI FOG 6CC (MISCELLANEOUS) ×2 IMPLANT
SPONGE GAUZE 4X4 12PLY (GAUZE/BANDAGES/DRESSINGS) ×2 IMPLANT
SUCKER WEIGHTED FLEX (MISCELLANEOUS) ×4 IMPLANT
SUT BONE WAX W31G (SUTURE) ×2 IMPLANT
SUT ETHIBOND (SUTURE) ×4 IMPLANT
SUT ETHIBOND 2 0 SH (SUTURE) ×2 IMPLANT
SUT ETHIBOND 2 0 V4 (SUTURE) IMPLANT
SUT ETHIBOND 2 0V4 GREEN (SUTURE) IMPLANT
SUT ETHIBOND 2-0 RB-1 WHT (SUTURE) ×4 IMPLANT
SUT ETHIBOND 4 0 TF (SUTURE) IMPLANT
SUT ETHIBOND 5 0 C 1 30 (SUTURE) IMPLANT
SUT ETHIBOND NAB MH 2-0 36IN (SUTURE) ×2 IMPLANT
SUT ETHIBOND X763 2 0 SH 1 (SUTURE) ×2 IMPLANT
SUT GORETEX 6.0 TH-9 30 IN (SUTURE) IMPLANT
SUT GORETEX CV 4 TH 22 36 (SUTURE) ×2 IMPLANT
SUT GORETEX CV-5THC-13 36IN (SUTURE) IMPLANT
SUT GORETEX CV4 TH-18 (SUTURE) ×10 IMPLANT
SUT GORETEX TH-18 36 INCH (SUTURE) IMPLANT
SUT MNCRL AB 3-0 PS2 18 (SUTURE) ×4 IMPLANT
SUT PROLENE 3 0 SH DA (SUTURE) ×8 IMPLANT
SUT PROLENE 3 0 SH1 36 (SUTURE) ×8 IMPLANT
SUT PROLENE 4 0 RB 1 (SUTURE) ×13
SUT PROLENE 4-0 RB1 .5 CRCL 36 (SUTURE) ×13 IMPLANT
SUT PROLENE 5 0 C 1 36 (SUTURE) ×4 IMPLANT
SUT PROLENE 6 0 C 1 30 (SUTURE) ×4 IMPLANT
SUT SILK  1 MH (SUTURE) ×9
SUT SILK 1 MH (SUTURE) ×9 IMPLANT
SUT SILK 1 TIES 10X30 (SUTURE) ×2 IMPLANT
SUT SILK 2 0 SH CR/8 (SUTURE) ×2 IMPLANT
SUT SILK 2 0 TIES 10X30 (SUTURE) ×2 IMPLANT
SUT SILK 2 0SH CR/8 30 (SUTURE) ×4 IMPLANT
SUT SILK 3 0 (SUTURE) ×1
SUT SILK 3 0 SH CR/8 (SUTURE) ×2 IMPLANT
SUT SILK 3 0SH CR/8 30 (SUTURE) ×2 IMPLANT
SUT SILK 3-0 18XBRD TIE 12 (SUTURE) ×1 IMPLANT
SUT TEM PAC WIRE 2 0 SH (SUTURE) ×4 IMPLANT
SUT VIC AB 2-0 CTX 36 (SUTURE) ×4 IMPLANT
SUT VIC AB 2-0 UR6 27 (SUTURE) ×4 IMPLANT
SUT VIC AB 3-0 SH 8-18 (SUTURE) ×6 IMPLANT
SUT VICRYL 2 TP 1 (SUTURE) ×2 IMPLANT
SYRINGE 10CC LL (SYRINGE) ×2 IMPLANT
SYSTEM SAHARA CHEST DRAIN ATS (WOUND CARE) ×2 IMPLANT
TAPE CLOTH SURG 4X10 WHT LF (GAUZE/BANDAGES/DRESSINGS) ×4 IMPLANT
TOWEL OR 17X24 6PK STRL BLUE (TOWEL DISPOSABLE) ×2 IMPLANT
TOWEL OR 17X26 10 PK STRL BLUE (TOWEL DISPOSABLE) ×2 IMPLANT
TRAY CATH LUMEN 1 20CM STRL (SET/KITS/TRAYS/PACK) ×2 IMPLANT
TRAY FOLEY IC TEMP SENS 14FR (CATHETERS) ×2 IMPLANT
TROCAR XCEL BLADELESS 5X75MML (TROCAR) ×2 IMPLANT
TROCAR XCEL NON-BLD 11X100MML (ENDOMECHANICALS) ×4 IMPLANT
TUBE SUCT INTRACARD DLP 20F (MISCELLANEOUS) ×2 IMPLANT
TUBING ART PRESS 48 MALE/FEM (TUBING) ×4 IMPLANT
TUNNELER SHEATH ON-Q 11GX8 (MISCELLANEOUS) IMPLANT
UNDERPAD 30X30 INCONTINENT (UNDERPADS AND DIAPERS) ×2 IMPLANT
WATER STERILE IRR 1000ML POUR (IV SOLUTION) ×4 IMPLANT
WIRE BENTSON .035X145CM (WIRE) ×2 IMPLANT

## 2011-10-15 NOTE — Op Note (Signed)
CARDIOTHORACIC SURGERY OPERATIVE NOTE  Date of Procedure:  10/15/2011  Preoperative Diagnosis: Severe Mitral Regurgitation  Postoperative Diagnosis: Same  Procedure:    Minimally-Invasive Mitral Valve Repair  Complex valvuloplasty including Alfieri edge-to-edge repair with Goretex cords x2  # 36 mm Sorin Memo 3D Ring Annuloplasty    Surgeon: Salvatore Decent. 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)   Severely thickened and redundant leaflet tissue diffusely  Severe calcification of the mitral annulus and subvalvular apparatus near P1 and the anterior commissure  Severe mitral regurgitation  Normal left ventricular systolic function  No residual mitral regurgitation following mitral valve repair  Episode of severe bronchospasm during post-bypass portion of operation which resolved    BRIEF CLINICAL NOTE AND INDICATIONS FOR SURGERY   Patient is a 56 year old female from [place] with remote history of astrocytoma of the brain originally treated with radiation therapy more than 40 years ago. The patient also had a ventriculoperitoneal shunt for hydrocephalus at the time. She has managed remarkably well under the circumstances despite the fact that she has been left with chronic severe problems with instability of gait and spasticity. Despite this she lives alone and cares for herself. She is for the most part confined to wheelchair although she seems to get around remarkably well under the circumstances. She reports that when she was a teenager she was told that she had mitral valve prolapse. Approximately 12 years ago she was noted to have a prominent murmur on exam and she was referred for cardiology consultation. For years she had been followed by Dr. Deborah Chalk, and more recently she has been followed by Dr. Shirlee Latch. She describes a long history of orthopnea with  episodes of paroxysmal nocturnal dyspnea. Some of these symptoms are associated with what the family terms "night terrors" but the patient reports that her primary problem is a sensation smothering or shortness of breath when she lays flat in bed. These symptoms are relieved by sitting up. More recently the symptoms have progressed and she is also developed some lower extremity edema. She underwent transesophageal echocardiogram in July of this year confirming the presence of mitral valve prolapse with severe mitral regurgitation. She was started on oral Lasix therapy at that time. Symptoms have improved somewhat on Lasix therapy, but the patient still had some associated orthopnea and shortness of breath. She was referred to consider elective mitral valve repair and a full consultation performed.  The rationale for elective mitral valve repair surgery has been explained, including a comparison between surgery and continued medical therapy with close follow-up.  The likelihood of successful and durable valve repair has been discussed with particular reference to the findings of their recent echocardiogram.  Based upon these findings and previous experience, I have quoted them a greater than 80 percent likelihood of successful valve repair.  In the unlikely event that their valve cannot be successfully repaired, we discussed the possibility of replacing the mitral valve using a mechanical prosthesis with the attendant need for long-term anticoagulation versus the alternative of replacing it using a bioprosthetic tissue valve with its potential for late structural valve deterioration and failure, depending upon the patient's longevity.  The patient specifically requests that if the mitral valve must be replaced that it be done using a mechanical valve.  Alternative surgical approaches have been discussed, including a comparison between conventional sternotomy and minimally-invasive techniques.  The relative risks and  benefits  of each have been reviewed as they pertain to the patient's specific circumstances, and all of their questions have been addressed. The patient understands and accepts all potential associated risks of surgery including but not limited to risk of death, stroke, myocardial infarction, congestive heart failure, respiratory failure, renal failure, bleeding requiring blood transfusion and/or reexploration, possible need for conversion to extended thoracotomy or median sternotomy, arrhythmia, heart block or bradycardia requiring permanent pacemaker, pneumonia, pleural effusion, chronic pain or hernia formation or other complications peculiar to mini thoracotomy incision, pulmonary embolus or other thromboembolic complication, aortic dissection or other vascular complications related to femoral artery cannulation, wound infection or lymphocele or nerve injury related to groin incision, or delayed complications related to mitral valve repair such as recurrent mitral regurgitation or infection.   All questions have been answered.     DETAILS OF THE OPERATIVE PROCEDURE  The patient is brought to the operating room on the above mentioned date and central monitoring was established by the anesthesia team including placement of Swan-Ganz catheter through the left internal jugular vein.  Initially a sheath was placed in the right internal jugular vein by mistake, but this was removed.  A left radial arterial line is placed. The patient is placed in the supine position on the operating table.  Intravenous antibiotics are administered. General endotracheal anesthesia is induced uneventfully. The patient is initially intubated using a dual lumen endotracheal tube.  A Foley catheter is placed.  A single dose of solumedrol is administered intravenously.  Baseline transesophageal echocardiogram was performed.  Findings were notable for classical Barlow's disease with severe bileaflet prolapse and severe mitral  regurgitation. There was severe calcification of the mitral annulus beneath the P1 portion of the posterior leaflet and the anterior commissure. This calcification caused shadowing which made some images difficult to obtain. However, there was bileaflet prolapse causing across all the coaptation surfaces. There were no areas of flail leaflets. The valve was quite large and going throughout with elongated records to all segments.  A soft roll is placed behind the patient's left scapula and the neck gently extended and turned to the left.   The patient's right neck, chest, abdomen, both groins, and both lower extremities are prepared and draped in a sterile manner. A time out procedure is performed.  A small incision is made in the right inguinal crease and the anterior surface of the right common femoral artery and right common femoral vein are identified.  A right miniature anterolateral thoracotomy incision is performed. The incision is placed just lateral to and superior to the right nipple. The pectoralis major muscle is retracted medially and completely preserved. The right pleural space is entered through the third intercostal space. A soft tissue retractor is placed.  Two 11 mm ports are placed through separate stab incisions inferiorly. The right pleural space is insufflated continuously with carbon dioxide gas through the posterior port during the remainder of the operation.  A pledgeted sutures placed through the dome of the right hemidiaphragm and retracted inferiorly to facilitate exposure.  A longitudinal incision is made in the pericardium 3 cm anterior to the phrenic nerve and silk traction sutures are placed on either side of the incision for exposure.  The patient is placed in Trendelenburg position. The right internal jugular vein is cannulated with Seldinger technique and a guidewire advanced into the right atrium. The patient is heparinized systemically. The right internal jugular vein is  cannulated with a 14 [place] pediatric femoral venous cannula. Pursestring sutures  are placed on the anterior surface of the right common femoral vein and right common femoral artery. The right common femoral vein is cannulated with the Seldinger technique and a guidewire is advanced under transesophageal echocardiogram guidance through the right atrium. The femoral vein is cannulated with a long 22 French femoral venous cannula. The right common femoral artery is cannulated with Seldinger technique and a flexible guidewire is advanced until it can be appreciated intraluminally in the descending thoracic aorta on transesophageal echocardiogram. The femoral artery is cannulated with an 18 French femoral arterial cannula.  Adequate heparinization is verified.      The entire pre-bypass portion of the operation was notable for stable hemodynamics.  Cardiopulmonary bypass was begun.  Vacuum assist venous drainage is utilized. The incision in the pericardium is extended in both directions. Venous drainage and exposure are notably excellent. A retrograde cardioplegia cannula is placed through the right atrium into the coronary sinus using transesophageal echocardiogram guidance.  An antegrade cardioplegia cannula is placed in the ascending aorta.    The patient is cooled to 28C systemic temperature.  The aortic cross clamp is applied and cold blood cardioplegia is delivered initially in an antegrade fashion through the aortic root.   Supplemental cardioplegia is given retrograde through the coronary sinus catheter. The initial cardioplegic arrest is rapid with early diastolic arrest.  Repeat doses of cardioplegia are administered intermittently every 20 to 30 minutes throughout the entire cross clamp portion of the operation through the aortic root and through the coronary sinus catheter in order to maintain completely flat electrocardiogram.  Myocardial protection was felt to be excellent.  A left atriotomy  incision was performed through the interatrial groove and extended partially across the back wall of the left atrium after opening the oblique sinus inferiorly.  The mitral valve is exposed using a self-retaining retractor.  The mitral valve was inspected and notable for findings consistent with Barlow's disease. The valve itself was quite large. There was severe thickening and a prolapse involving both the anterior posterior leaflet across all the coaptation surfaces. There were no ruptured cords. The primary cords to all segments were elongated. There was severe calcification in the mitral annulus around the P1 portion of the posterior leaflet and the anterior commissure. This calcification extended into the subdeltoid apparatus. Despite this heavy calcification, the P1 portion of the posterior leaflet remained mobile and was not significantly restricted. The anterior commissure was somewhat deformed at this area.  Interrupted 2-0 Ethibond horizontal mattress sutures are placed circumferentially around the entire mitral valve annulus. The sutures will ultimately be utilized for ring annuloplasty, and at this juncture there are utilized to suspend the valve symmetrically.  The anterior commissure was closed with 2 everting and 4 Prolene sutures. The cleft between P1 and P2 was closed with a running 4 Prolene sutures. A single pledgeted see me for a Gore-Tex suture was placed to the head of the posterior papillary muscle and tied. The 2 limbs of this Gore-Tex suture were placed into the left ventricular chamber for later retrieval to be utilized for artificial Gore-Tex cord replacement because of excessive prolapse in the P3 region.  An Alfieri edge-to-edge repair is planned because of the diffuse prolapse involving all segments of both the anterior and posterior leaflet across the entire surface of coaptation.  The anterior and posterior leaflets were sewn together in the middle of the valve between A2 and P2  using a series of interrupted everting 4 Prolene sutures.  The valve  was tested with saline and appeared competent even without ring annuloplasty complete. The valve was sized to a 36 mm annuloplasty ring, based upon the transverse distance between the left and right commissures and the height of the anterior leaflet, corresponding to a ring size two sizes larger than the overall surface area of the anterior leaflet.  A Sorin Memo 3D annuloplasty ring (size 36mm, catalog [id number], serial [id number]) was secured in place uneventfully. The valve was tested with saline and appeared competent, although there remained excessive billowing at P3.   The individual limbs of the Goretex neocords were retrieved from the LV chamber, woven into the P3 portion of the posterior leaflet beginning at the free margin where they were placed from the ventricular surface to the atrial surface, and then woven in a diamond shaped fashion towards the posterior mitral annulus.  The Goretex sutures were then tied while the LV was distended with saline so as to adjust the length of the neocords to the appropriate length.  The valve is again tested with saline and appears to be perfectly competent with a broad symmetrical line of coaptation of the anterior and posterior leaflet. There is no residual leak.  Rewarming is begun.  The atriotomy was closed  using a 2-layer closure of running 3-0 Prolene suture after placing a sump drain across the mitral valve to serve as a left ventricular vent.  One final dose of warm retrograde "hot shot" cardioplegia was administered retrograde through the coronary sinus catheter while all air was evacuated through the aortic root.  The aortic cross clamp was removed after a total cross clamp time of 148 minutes.  Epicardial pacing wires are fixed to the inferior wall of the right ventricule and to the right atrial appendage. The patient is rewarmed to 37C temperature. The left ventricular vent is  removed.  The patient is ventilated and flow volumes turndown while the mitral valve repair is inspected using transesophageal echocardiogram. The valve repair appears intact with no residual leak. The antegrade cardioplegia cannula is now removed. The patient is weaned and disconnected from cardiopulmonary bypass.  After initial attempts to wean from bypass into coronary and was noted and electrocardiogram developed ischemic changes. Transesophageal echocardiogram confirmed the presence of air in the left atrium and left ventricle and the inferior wall was akinetic. Cardioplegia bypass was resumed and the patient was rested for a period of time. After that had resolved, the patient was weaned from cardioplegic bypass without difficulty. The patient's rhythm at separation from bypass was sinus.  The patient was weaned from bypass on low dose dopamine. Total cardiopulmonary bypass time for the operation was 222 minutes.  Followup transesophageal echocardiogram performed after separation from bypass revealed a well-seated annuloplasty ring in the mitral position with a double orifice mitral valve. There was no residual leak.  Left ventricular function was unchanged from preoperatively.    The femoral arterial and venous cannulae were removed uneventfully. There was a palpable pulse in the distal right common femoral artery after removal of the cannula. Protamine was administered to reverse the anticoagulation. The right internal jugular cannula was removed and manual pressure held on the neck for 15 minutes.    The post-bypass portion of the operation was notable for a transient episode of hypoxemia and hypotension that seem to be related to severe bronchospasm. Ultimately the patient was treated with racemic epinephrine and injected into the endotracheal tube and the patient's ventilation return to normal and hemodynamics normalized.  During this episode  a chest tube was placed into the left pleural space to  make certain that a left pneumothorax was not present. There was no evidence for pneumothorax at the time of chest tube placement.  Single lung ventilation was begun. The atriotomy closure was inspected for hemostasis. The pericardial sac was drained using a 28 French Bard drain placed through the anterior port incision.  The pericardium was closed using a patch of core matrix bovine submucosal tissue patch. The right pleural space is irrigated with saline solution and inspected for hemostasis. The right pleural space was drained using a 28 French Bard drain placed through the posterior port incision and a 28 French straight chest tube placed through a separate stab incision. The miniature thoracotomy incision was closed in multiple layers in routine fashion. The right groin incision was inspected for hemostasis and closed in multiple layers in routine fashion.  The patient was reintubated using a single lumen endotracheal tube and subsequently transported to the surgical intensive care unit in stable condition. There were no intraoperative complications. All sponge instrument and needle counts are verified correct at completion of the operation.     Salvatore Decent. Cornelius Moras MD 10/15/2011 6:27 PM

## 2011-10-15 NOTE — Preoperative (Signed)
Beta Blockers   Reason not to administer Beta Blockers:Not Applicable 

## 2011-10-15 NOTE — Anesthesia Procedure Notes (Signed)
Procedure Name: Intubation Date/Time: 10/15/2011 7:54 AM Performed by: Tyrone Nine Pre-anesthesia Checklist: Patient identified, Emergency Drugs available, Suction available and Patient being monitored Patient Re-evaluated:Patient Re-evaluated prior to inductionOxygen Delivery Method: Circle System Utilized Preoxygenation: Pre-oxygenation with 100% oxygen Intubation Type: IV induction Ventilation: Mask ventilation without difficulty and Oral airway inserted - appropriate to patient size Laryngoscope Size: Mac and 3 Grade View: Grade II Endobronchial tube: Double lumen EBT, EBT position confirmed by auscultation and EBT position confirmed by fiberoptic bronchoscope and 37 Fr Number of attempts: 1 Airway Equipment and Method: patient positioned with wedge pillow and stylet Placement Confirmation: ETT inserted through vocal cords under direct vision,  positive ETCO2 and CO2 detector Secured at: 25 cm Tube secured with: Tape Dental Injury: Teeth and Oropharynx as per pre-operative assessment

## 2011-10-15 NOTE — Progress Notes (Signed)
Patient ID: Michaela [name], female   DOB: [birth year], 56 y.o.   MRN: [id number] BP 78/51  Pulse 88  Temp(Src) 97.7 F (36.5 C) (Oral)  Resp 16  Wt 86 lb 6.7 oz (39.2 kg)  SpO2 100% Sedated on vent Not bleeding Face and neck still very swollen Wait to extubate until swelling decreased I have seen and examined Michaela [name] and agree with the above assessment  and plan.  Delight Ovens MD Beeper [phone] Office [phone] 10/15/2011 7:17 PM

## 2011-10-15 NOTE — Progress Notes (Signed)
  Echocardiogram Echocardiogram Transesophageal has been performed.  Mercy Moore 10/15/2011, 9:10 AM

## 2011-10-15 NOTE — Brief Op Note (Signed)
10/15/2011  3:32 PM  PATIENT:  Michaela [name]  56 y.o. female  PRE-OPERATIVE DIAGNOSIS:  MITRAL REGURGITATION  POST-OPERATIVE DIAGNOSIS:  MITRAL REGURGITATION  PROCEDURE:  Procedure(s): MINIMALLY INVASIVE MITRAL VALVE REPAIR (MVR)  SURGEON:    Purcell Nails, MD  ASSISTANTS:  Al Corpus, CSFA  ANESTHESIA:   Remonia Richter, MD  CROSSCLAMP TIME:   148' CARDIOPULMONARY BYPASS TIME: 222'  FINDINGS:  Barlow's Syndrome with bileaflet prolapse (type II dysfunction) and severe mitral regurgitation  Severe calcification of mitral annulus and subvalvular apparatus near P1 and anterior commissure  Normal LV systolic function  No residual MR after successful repair  Episode of severe bronchospasm, inability to ventilate during post-bypass portion of procedure - possible transfusion reaction versus displacement of dual lumen ET tube  COMPLICATIONS: None known  PATIENT DISPOSITION:   TO SICU IN STABLE CONDITION  [name],[name] 10/15/2011 3:33 PM

## 2011-10-15 NOTE — Anesthesia Postprocedure Evaluation (Signed)
Anesthesia Post Note  Patient: Michaela [name]  Procedure(s) Performed:  MINIMALLY INVASIVE MITRAL VALVE REPAIR (MVR)  Anesthesia type: General  Patient location: ICU  Post pain: Pain level controlled  Post assessment: Post-op Vital signs reviewed  Last Vitals:  Filed Vitals:   10/15/11 1730  BP:   Pulse: 88  Temp: 35 C  Resp: 12    Post vital signs: stable  Level of consciousness: Patient remains intubated per anesthesia plan  Complications: No apparent anesthesia complications

## 2011-10-15 NOTE — Anesthesia Preprocedure Evaluation (Addendum)
Anesthesia Evaluation  Patient identified by MRN, date of birth, ID band Patient awake    Reviewed: Allergy & Precautions, H&P , NPO status , Patient's Chart, lab work & pertinent test results  Airway Mallampati: III TM Distance: >3 FB Neck ROM: Full    Dental  (+) Teeth Intact,    Pulmonary shortness of breath and with exertion,    Pulmonary exam normal       Cardiovascular + Valvular Problems/Murmurs MR Regular Normal+ Systolic murmurs    Neuro/Psych Anxiety  Neuromuscular disease    GI/Hepatic   Endo/Other    Renal/GU      Musculoskeletal   Abdominal Normal abdominal exam  (+)   Peds  Hematology   Anesthesia Other Findings Small Mouth.  Pt states she has a sore mouth upper right.  She "Gargles w/ salt H2O"  3 X's /day.  Will take care when inserting ETT.  Reproductive/Obstetrics                         Anesthesia Physical Anesthesia Plan  ASA: IV  Anesthesia Plan: General   Post-op Pain Management:    Induction: Intravenous  Airway Management Planned: Oral ETT  Additional Equipment: Arterial line, 3D TEE, PA Cath and Ultrasound Guidance Line Placement  Intra-op Plan:   Post-operative Plan: Post-operative intubation/ventilation  Informed Consent: I have reviewed the patients History and Physical, chart, labs and discussed the procedure including the risks, benefits and alternatives for the proposed anesthesia with the patient or authorized representative who has indicated his/her understanding and acceptance.   Dental advisory given  Plan Discussed with: CRNA, Anesthesiologist and Surgeon  Anesthesia Plan Comments:        Anesthesia Quick Evaluation

## 2011-10-15 NOTE — Interval H&P Note (Signed)
History and Physical Interval Note:  10/15/2011 7:37 AM  Michaela [name]  has presented today for surgery, with the diagnosis of MITRAL REGURGITATION  The various methods of treatment have been discussed with the patient and family. After consideration of risks, benefits and other options for treatment, the patient has consented to  Procedure(s): MINIMALLY INVASIVE MITRAL VALVE REPAIR (MVR) as a surgical intervention .  The patients' history has been reviewed, patient examined, no change in status, stable for surgery.  I have reviewed the patients' chart and labs.  Questions were answered to the patient's satisfaction.     [name],[name]

## 2011-10-15 NOTE — Transfer of Care (Signed)
Immediate Anesthesia Transfer of Care Note  Patient: Michaela [name]  Procedure(s) Performed:  MINIMALLY INVASIVE MITRAL VALVE REPAIR (MVR)  Patient Location: PACU and SICU  Anesthesia Type: General  Level of Consciousness: unresponsive  Airway & Oxygen Therapy: Patient placed on Ventilator (see vital sign flow sheet for setting)  Post-op Assessment: Post -op Vital signs reviewed and stable  Post vital signs: Reviewed and stable  Complications: No apparent anesthesia complications

## 2011-10-16 ENCOUNTER — Encounter (HOSPITAL_COMMUNITY): Payer: Self-pay | Admitting: Thoracic Surgery (Cardiothoracic Vascular Surgery)

## 2011-10-16 ENCOUNTER — Inpatient Hospital Stay (HOSPITAL_COMMUNITY): Payer: Medicare Other

## 2011-10-16 LAB — POCT I-STAT 4, (NA,K, GLUC, HGB,HCT)
Glucose, Bld: 135 mg/dL — ABNORMAL HIGH (ref 70–99)
Hemoglobin: 9.2 g/dL — ABNORMAL LOW (ref 12.0–15.0)
Potassium: 2.8 mEq/L — ABNORMAL LOW (ref 3.5–5.1)
Sodium: 145 mEq/L (ref 135–145)

## 2011-10-16 LAB — CREATININE, SERUM: Creatinine, Ser: 0.4 mg/dL — ABNORMAL LOW (ref 0.50–1.10)

## 2011-10-16 LAB — PREPARE PLATELET PHERESIS: Unit division: 0

## 2011-10-16 LAB — POCT I-STAT, CHEM 8
BUN: 15 mg/dL (ref 6–23)
Potassium: 3.8 mEq/L (ref 3.5–5.1)
Sodium: 135 mEq/L (ref 135–145)
TCO2: 23 mmol/L (ref 0–100)

## 2011-10-16 LAB — POCT I-STAT 3, ART BLOOD GAS (G3+)
Acid-base deficit: 1 mmol/L (ref 0.0–2.0)
Acid-base deficit: 14 mmol/L — ABNORMAL HIGH (ref 0.0–2.0)
Acid-base deficit: 2 mmol/L (ref 0.0–2.0)
Bicarbonate: 12.5 mEq/L — ABNORMAL LOW (ref 20.0–24.0)
Bicarbonate: 26.5 mEq/L — ABNORMAL HIGH (ref 20.0–24.0)
Bicarbonate: 21.4 mEq/L (ref 20.0–24.0)
Bicarbonate: 21.4 mEq/L (ref 20.0–24.0)
O2 Saturation: 83 %
Patient temperature: 37.6
Patient temperature: 37.2
TCO2: 22 mmol/L (ref 0–100)
TCO2: 23 mmol/L (ref 0–100)
pCO2 arterial: 38.4 mmHg (ref 35.0–45.0)
pCO2 arterial: 35.3 mmHg (ref 35.0–45.0)
pCO2 arterial: 38.3 mmHg (ref 35.0–45.0)
pH, Arterial: 7.392 (ref 7.350–7.400)
pH, Arterial: 7.356 (ref 7.350–7.400)
pO2, Arterial: 57 mmHg — ABNORMAL LOW (ref 80.0–100.0)
pO2, Arterial: 114 mmHg — ABNORMAL HIGH (ref 80.0–100.0)
pO2, Arterial: 76 mmHg — ABNORMAL LOW (ref 80.0–100.0)

## 2011-10-16 LAB — PREPARE FRESH FROZEN PLASMA: Unit division: 0

## 2011-10-16 LAB — CBC
Hemoglobin: 11.7 g/dL — ABNORMAL LOW (ref 12.0–15.0)
MCH: 29.8 pg (ref 26.0–34.0)
MCHC: 35.1 g/dL (ref 30.0–36.0)
MCV: 84.9 fL (ref 78.0–100.0)

## 2011-10-16 LAB — BASIC METABOLIC PANEL
BUN: 15 mg/dL (ref 6–23)
CO2: 24 mEq/L (ref 19–32)
Calcium: 6.6 mg/dL — ABNORMAL LOW (ref 8.4–10.5)
Creatinine, Ser: 0.47 mg/dL — ABNORMAL LOW (ref 0.50–1.10)
GFR calc non Af Amer: >90 mL/min (ref >90)
Glucose, Bld: 159 mg/dL — ABNORMAL HIGH (ref 70–99)
Sodium: 137 mEq/L (ref 135–145)

## 2011-10-16 LAB — GLUCOSE, CAPILLARY
Glucose-Capillary: 79 mg/dL (ref 70–99)
Glucose-Capillary: 159 mg/dL — ABNORMAL HIGH (ref 70–99)
Glucose-Capillary: 137 mg/dL — ABNORMAL HIGH (ref 70–99)

## 2011-10-16 LAB — MAGNESIUM: Magnesium: 2.2 mg/dL (ref 1.5–2.5)

## 2011-10-16 MED ORDER — AMIODARONE HCL 200 MG PO TABS
200.0000 mg | ORAL_TABLET | Freq: Two times a day (BID) | ORAL | Status: DC
  Administered 2011-10-16 – 2011-10-17 (×4): 200 mg via ORAL
  Filled 2011-10-16 (×7): qty 1

## 2011-10-16 MED ORDER — INSULIN ASPART 100 UNIT/ML ~~LOC~~ SOLN
0.0000 [IU] | SUBCUTANEOUS | Status: DC
  Administered 2011-10-16 – 2011-10-17 (×5): 2 [IU] via SUBCUTANEOUS
  Filled 2011-10-16: qty 3

## 2011-10-16 MED FILL — Sodium Bicarbonate IV Soln 8.4%: INTRAVENOUS | Qty: 50 | Status: AC

## 2011-10-16 MED FILL — Sodium Chloride IV Soln 0.9%: INTRAVENOUS | Qty: 1000 | Status: AC

## 2011-10-16 MED FILL — Lidocaine HCl IV Inj 20 MG/ML: INTRAVENOUS | Qty: 5 | Status: AC

## 2011-10-16 MED FILL — Heparin Sodium (Porcine) Inj 1000 Unit/ML: INTRAMUSCULAR | Qty: 10 | Status: AC

## 2011-10-16 MED FILL — Mannitol IV Soln 20%: INTRAVENOUS | Qty: 500 | Status: AC

## 2011-10-16 MED FILL — Heparin Sodium (Porcine) Inj 1000 Unit/ML: INTRAMUSCULAR | Qty: 30 | Status: AC

## 2011-10-16 MED FILL — Sodium Chloride Irrigation Soln 0.9%: Qty: 3000 | Status: AC

## 2011-10-16 MED FILL — Electrolyte-R (PH 7.4) Solution: INTRAVENOUS | Qty: 5000 | Status: AC

## 2011-10-16 NOTE — Procedures (Signed)
Extubation Procedure Note  Patient Details:   Name: BRITTLEY REGNER DOB: 1955-12-09 MRN: [id number]   Airway Documentation:  Airway 37 mm (Active)  Secured at (cm) 21 cm 10/16/2011  5:07 AM  Measured From Lips 10/16/2011  5:07 AM  Secured Location Right 10/16/2011  5:07 AM  Secured By Caron Presume Tape 10/16/2011  5:07 AM  Cuff Pressure (cm H2O) 24 cm H2O 10/15/2011  9:06 PM  Site Condition Dry 10/16/2011  5:07 AM    Evaluation  O2 sats: stable throughout Complications: No apparent complications Patient did tolerate procedure well. Bilateral Breath Sounds: Clear Suctioning: Airway Yes  Filbert Schilder 10/16/2011, 5:43 AM  Patient was weaned, performed SBT, coached on deep breathing, cough, and was extubated to a 4 L nasal cannula.  No evidence of stridor present.  NIF -30 VC 800  Patient was able to breath around a deflated cuff.

## 2011-10-16 NOTE — Progress Notes (Signed)
Patient examined and record reviewed.Hemodynamics stable,labs satisfactory.Patient had stable day. Resting comfortablyContinue current care. VAN TRIGT III,Ebb Carelock 10/16/2011

## 2011-10-16 NOTE — Progress Notes (Signed)
CSW received referral for pt needing SNF placement. CSW completed psychosocial assessment (located in shadow chart). Pt states her PCP requests placement at [facility], and CSW will coordinate placement.   CSW will continue to follow to facilitate d/c pt as pt progresses medically.  Baxter Flattery, MSW [phone]

## 2011-10-16 NOTE — Progress Notes (Signed)
At 0045, pt was calm/awake and able to follow simple commands, however pt was still very swollen in neck, face, and mouth areas.  In order to determine ability to wean from the ventilator, a cuff leak check was performed by Gypsy Decant RRT. Upon deflation of ETT cuff, no air movement was heard upon auscultation by ear or by stethoscope. Also upon deflation, no decrease in tidal volumes was noted on the ventilator. Due to these factors, the decision was made to allow pt's swelling to reduce even further before attempting a true ventilator wean. Precedex to be resumed at a rate that the pt is comfortable at.  Caprice Kluver

## 2011-10-16 NOTE — Progress Notes (Signed)
   CARDIOTHORACIC SURGERY PROGRESS NOTE   R1 Day Post-Op Procedure(s) (LRB): MINIMALLY INVASIVE MITRAL VALVE REPAIR (MVR) (Right)  Subjective: Looks good.  Awake and alert, breathing comfortably on nasal cannula.  Mild soreness.  Objective: Vital signs: BP Readings from Last 1 Encounters:  10/16/11 83/52   Pulse Readings from Last 1 Encounters:  10/16/11 84   Resp Readings from Last 1 Encounters:  10/16/11 12   Temp Readings from Last 1 Encounters:  10/16/11 99.1 F (37.3 C)     Hemodynamics: PAP: (30-43)/(16-27) 39/21 mmHg CO:  [2 L/min-2.8 L/min] 2.5 L/min CI:  [1.5 L/min/m2-2.2 L/min/m2] 1.9 L/min/m2  Physical Exam:  Rhythm:   sinus  Breath sounds: Diminished at bases, otherwise clear  Heart sounds:  RRR, no murmur  Incisions:  Dressings dry  Abdomen:  Soft, non distended   Extremities:  Warm, well perfused   Intake/Output from previous day: 02/05 0701 - 02/06 0700 In: 8321.5 [I.V.:5205.5; Blood:2006; NG/GT:60; IV Piggyback:1050] Out: 5735 [Urine:3845; Blood:1300; Chest Tube:590] Intake/Output this shift: Total I/O In: 377.5 [I.V.:127.5; IV Piggyback:250] Out: -   Lab Results:  Basename 10/16/11 0355 10/15/11 2208 10/15/11 2200  WBC 12.7* -- 12.1*  HGB 11.7* 9.5* --  HCT 33.3* 28.0* --  PLT 105* -- 136*   BMET:  Basename 10/16/11 0355 10/15/11 2208  NA 137 137  K 4.0 5.0  CL 106 108  CO2 24 --  GLUCOSE 159* 181*  BUN 15 16  CREATININE 0.47* 0.50  CALCIUM 6.6* --    CBG (last 3)   Basename 10/16/11 0731 10/16/11 0357 10/16/11 0147  GLUCAP 137* 159* 131*   ABG    Component Value Date/Time   PHART 7.356 10/16/2011 0647   HCO3 21.4 10/16/2011 0647   TCO2 23 10/16/2011 0647   ACIDBASEDEF 4.0* 10/16/2011 0647   O2SAT 94.0 10/16/2011 0647   CXR: Looks good.  Mild CHF and bibasilar atelectasis L>R, ? Some effusion on left  Assessment/Plan: S/P Procedure(s) (LRB): MINIMALLY INVASIVE MITRAL VALVE REPAIR (MVR) (Right)  Doing well POD1 Expected  post op acute blood loss anemia, mild Expected post op acute blood loss anemia, moderate Vasodilated, still on neo drip   Mobilize  D/C swan  Wean drips as tolerated  Add lasix when BP more stable off Neo  [name],[name] 10/16/2011 8:30 AM

## 2011-10-16 NOTE — Progress Notes (Signed)
   CARE MANAGEMENT NOTE 10/16/2011  Patient:  Michaela [name],Michaela [name]   Account Number:  [id number]  Date Initiated:  10/16/2011  Documentation initiated by:  Marque Bango  Subjective/Objective Assessment:   PT S/P MVR ON 10/15/11.     Action/Plan:   PTA, PT LIVES ALONE; PLANS TO DISCHARGE TO SNF FOR REHAB   Anticipated DC Date:  10/21/2011   Anticipated DC Plan:  SKILLED NURSING FACILITY  In-house referral  Clinical Social Worker      DC Planning Services  CM consult      Choice offered to / List presented to:             Status of service:  In process, will continue to follow Medicare Important Message given?   (If response is "NO", the following Medicare IM given date fields will be blank) Date Medicare IM given:   Date Additional Medicare IM given:    Discharge Disposition:    Per UR Regulation:    Comments:  10/16/11 Gradie Ohm,RN,BSN 1400 REFERRAL TO CSW TO FACILITATE DISCHARGE TO SNF FOR REHAB WHEN MEDICALLY STABLE.  WILL CONSULT PT AND OT. WILL FOLLOW. Phone #[phone]

## 2011-10-16 NOTE — Progress Notes (Signed)
UR Completed. Simmons, Iniko Robles F [phone]  

## 2011-10-17 ENCOUNTER — Inpatient Hospital Stay (HOSPITAL_COMMUNITY): Payer: Medicare Other

## 2011-10-17 LAB — CBC
HCT: 26.5 % — ABNORMAL LOW (ref 36.0–46.0)
HCT: 25.5 % — ABNORMAL LOW (ref 36.0–46.0)
Hemoglobin: 9.2 g/dL — ABNORMAL LOW (ref 12.0–15.0)
Hemoglobin: 8.7 g/dL — ABNORMAL LOW (ref 12.0–15.0)
MCH: 30.1 pg (ref 26.0–34.0)
MCV: 86.6 fL (ref 78.0–100.0)
MCV: 87.6 fL (ref 78.0–100.0)
RBC: 3.06 MIL/uL — ABNORMAL LOW (ref 3.87–5.11)
RBC: 2.91 MIL/uL — ABNORMAL LOW (ref 3.87–5.11)
WBC: 13 10*3/uL — ABNORMAL HIGH (ref 4.0–10.5)

## 2011-10-17 LAB — GLUCOSE, CAPILLARY
Glucose-Capillary: 104 mg/dL — ABNORMAL HIGH (ref 70–99)
Glucose-Capillary: 132 mg/dL — ABNORMAL HIGH (ref 70–99)
Glucose-Capillary: 140 mg/dL — ABNORMAL HIGH (ref 70–99)
Glucose-Capillary: 146 mg/dL — ABNORMAL HIGH (ref 70–99)
Glucose-Capillary: 93 mg/dL (ref 70–99)

## 2011-10-17 LAB — BASIC METABOLIC PANEL
CO2: 27 mEq/L (ref 19–32)
Chloride: 105 mEq/L (ref 96–112)
GFR calc Af Amer: >90 mL/min (ref >90)
Potassium: 3.9 mEq/L (ref 3.5–5.1)

## 2011-10-17 MED ORDER — FUROSEMIDE 10 MG/ML IJ SOLN
20.0000 mg | Freq: Once | INTRAMUSCULAR | Status: AC
  Administered 2011-10-17: 20 mg via INTRAVENOUS
  Filled 2011-10-17: qty 2

## 2011-10-17 MED ORDER — TRAMADOL HCL 50 MG PO TABS
50.0000 mg | ORAL_TABLET | Freq: Two times a day (BID) | ORAL | Status: DC | PRN
  Administered 2011-10-18: 50 mg via ORAL
  Filled 2011-10-17: qty 1

## 2011-10-17 MED ORDER — POTASSIUM CHLORIDE 10 MEQ/50ML IV SOLN
10.0000 meq | INTRAVENOUS | Status: AC
  Administered 2011-10-17 (×2): 10 meq via INTRAVENOUS
  Filled 2011-10-17 (×2): qty 50

## 2011-10-17 MED ORDER — WARFARIN SODIUM 2.5 MG PO TABS
2.5000 mg | ORAL_TABLET | Freq: Every day | ORAL | Status: DC
  Administered 2011-10-17 – 2011-10-20 (×4): 2.5 mg via ORAL
  Filled 2011-10-17 (×5): qty 1

## 2011-10-17 MED ORDER — SOMATROPIN 10 MG/1.5ML ~~LOC~~ SOLN
0.5000 mg | Freq: Every evening | SUBCUTANEOUS | Status: DC
  Administered 2011-10-17 – 2011-10-28 (×12): 0.5333 mg via SUBCUTANEOUS

## 2011-10-17 MED FILL — Heparin Sodium (Porcine) Inj 1000 Unit/ML: INTRAMUSCULAR | Qty: 10 | Status: AC

## 2011-10-17 MED FILL — Magnesium Sulfate Inj 50%: INTRAMUSCULAR | Qty: 10 | Status: AC

## 2011-10-17 MED FILL — Lactated Ringer's Solution: INTRAVENOUS | Qty: 500 | Status: AC

## 2011-10-17 MED FILL — Potassium Chloride Inj 2 mEq/ML: INTRAVENOUS | Qty: 20 | Status: AC

## 2011-10-17 MED FILL — Nitroglycerin IV Soln 5 MG/ML: INTRAVENOUS | Qty: 10 | Status: AC

## 2011-10-17 MED FILL — Dexmedetomidine HCl IV Soln 200 MCG/2ML: INTRAVENOUS | Qty: 2 | Status: AC

## 2011-10-17 MED FILL — Verapamil HCl IV Soln 2.5 MG/ML: INTRAVENOUS | Qty: 4 | Status: AC

## 2011-10-17 NOTE — Evaluation (Signed)
Physical Therapy Evaluation Patient Details Name: Michaela [name] MRN: [id number] DOB: Jul 27, 1956 Today's Date: 10/17/2011  Problem List:  Patient Active Problem List  Diagnoses  . Mitral regurgitation due to cusp prolapse  . S/P mitral valve repair  . Brain tumor, astrocytoma  . Neuromuscular disorder  . Neurogenic bladder  . Arthritis    Past Medical History:  Past Medical History  Diagnosis Date  . 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 MVP (mitral valve prolapse)   . Mitral regurgitation   . Shortness of breath   . Heart murmur   . Neuromuscular disorder     numbness in hands and feet  . Arthritis   . Overactive bladder    Past Surgical History:  Past Surgical History  Procedure Date  . Ventriculoperitoneal shunt 1971  . Knee arthroscopy     right  . Cardiac catheterization     Jan 2013  . Transesophageal echocardiogram 7/12  . US echocardiography 6/12  . Mitral valve repair 10/15/2011    Procedure: MINIMALLY INVASIVE MITRAL VALVE REPAIR (MVR);  Surgeon: Purcell Nails, MD;  Location: [facility] OR;  Service: Open Heart Surgery;  Laterality: Right;    PT Assessment/Plan/Recommendation PT Assessment Clinical Impression Statement: Pt s/p minimally invasive MVR with history of astrocytoma and increased assist at baseline. Pt will benefit from acute therapy to maximize strength and independence before transfer to next venue with anticipation of eventual return home. Pt very pleasant and willing to work.  PT Recommendation/Assessment: Patient will need skilled PT in the acute care venue PT Problem List: Decreased strength;Decreased knowledge of use of DME;Decreased activity tolerance;Decreased balance;Decreased mobility;Pain Barriers to Discharge: Decreased caregiver support PT Therapy Diagnosis : Generalized weakness;Acute pain PT Plan PT Frequency: Min 3X/week PT Treatment/Interventions: Gait training;DME instruction;Functional mobility  training;Therapeutic activities;Therapeutic exercise;Balance training;Patient/family education PT Recommendation Follow Up Recommendations: Skilled nursing facility Equipment Recommended: Defer to next venue PT Goals  Acute Rehab PT Goals PT Goal Formulation: With patient Time For Goal Achievement: 2 weeks Pt will go Supine/Side to Sit: with supervision PT Goal: Supine/Side to Sit - Progress: Goal set today Pt will go Sit to Supine/Side: with supervision PT Goal: Sit to Supine/Side - Progress: Goal set today Pt will go Sit to Stand: with min assist PT Goal: Sit to Stand - Progress: Goal set today Pt will go Stand to Sit: with min assist PT Goal: Stand to Sit - Progress: Goal set today Pt will Ambulate: 1 - 15 feet;with min assist;with least restrictive assistive device PT Goal: Ambulate - Progress: Goal set today  PT Evaluation Precautions/Restrictions  Precautions Precautions: Fall Precaution Comments: multiple lines and bil chest tubes Required Braces or Orthoses: No Restrictions Weight Bearing Restrictions: No Prior Functioning  Home Living Lives With: Alone Receives Help From: Personal care attendant;Family Type of Home: House Home Layout: One level Home Access: Ramped entrance Bathroom Shower/Tub: Engineer, manufacturing systems: Handicapped height Bathroom Accessibility: Yes How Accessible: Accessible via wheelchair Home Adaptive Equipment: Tub transfer bench;Wheelchair - manual;Grab bars around toilet Prior Function Level of Independence: Needs assistance with ADLs;Needs assistance with homemaking;Independent with transfers;Requires assistive device for independence Bath: Maximal Dressing: Maximal Meal Prep: Total (microwave meals, family prepares) Light Housekeeping: Total Driving: No Vocation: Unemployed Cognition Cognition Arousal/Alertness: Awake/alert Overall Cognitive Status: Appears within functional limits for tasks assessed Orientation Level:  Oriented X4 Cognition - Other Comments: Pt. requested to have the calendar changed to the correct date. Sensation/Coordination Coordination Gross Motor Movements are Fluid  and Coordinated: Yes Fine Motor Movements are Fluid and Coordinated: No Extremity Assessment RUE Assessment RUE Assessment: Within Functional Limits LUE Assessment LUE Assessment: Exceptions to [facility] (Limited by lines, however pt. reports [facility]) RLE Assessment RLE Assessment: Exceptions to [facility] RLE Strength RLE Overall Strength Comments: not formally assessed, grossly 2+/5 LLE Assessment LLE Assessment: Exceptions to [facility] LLE Strength LLE Overall Strength Comments: not formally assessed, grossly 2+/5 Mobility (including Balance) Bed Mobility Bed Mobility: No Transfers Transfers: Yes Sit to Stand: 3: Mod assist;From chair/3-in-1;With armrests Sit to Stand Details (indicate cue type and reason): cueing for hand placement, safety and assist for anterior weight shift, +1 throughout for line management and safety, 2 trials Stand to Sit: To chair/3-in-1;3: Mod assist;With armrests Stand to Sit Details: cueing for safety, 2 trials Ambulation/Gait Ambulation/Gait: Yes Ambulation/Gait Assistance: 3: Mod assist Ambulation/Gait Assistance Details (indicate cue type and reason): +1 for lines and safety. Cueing for posture and sequence. Pt maintains slightly kyphotic posture with unsteady gait Ambulation Distance (Feet): 8 Feet (4 ft forward and back, 1st trial one foot) Assistive device: Rolling walker Gait Pattern: Trunk flexed;Shuffle (generally unsteady gait) Stairs: No  Posture/Postural Control Posture/Postural Control: Postural limitations Postural Limitations: slightly flexed trunk,  pt with ataxic type head movement Balance Balance Assessed: Yes Static Sitting Balance Static Sitting - Balance Support: Bilateral upper extremity supported;Feet supported Static Sitting - Level of Assistance: 5: Stand by  assistance Exercise    End of Session PT - End of Session Equipment Utilized During Treatment: Gait belt Activity Tolerance: Patient tolerated treatment well Patient left: in chair;with call bell in reach;Other (comment) (sitter present) Nurse Communication: Mobility status for transfers;Mobility status for ambulation General Behavior During Session: [facility] for tasks performed Cognition: [facility] for tasks performed  Co-eval with OT  Toney Sang Beth 10/17/2011, 8:46 AM  Toney Sang, PT [phone]

## 2011-10-17 NOTE — Progress Notes (Signed)
TCTS BRIEF SICU PROGRESS NOTE  2 Days Post-Op  S/P Procedure(s) (LRB): MINIMALLY INVASIVE MITRAL VALVE REPAIR (MVR) (Right)   Looks good Stable day BP runs low at baseline  Plan: Will start lasix.  Continue current plan.  Shriyans Kuenzi H 10/17/2011 9:28 PM

## 2011-10-17 NOTE — Progress Notes (Signed)
   CARDIOTHORACIC SURGERY PROGRESS NOTE   R2 Days Post-Op Procedure(s) (LRB): MINIMALLY INVASIVE MITRAL VALVE REPAIR (MVR) (Right)  Subjective: Feels a little nauseated this am but otherwise well. Mild pain in back. Breathing comfortably.  Objective: Vital signs: BP Readings from Last 1 Encounters:  10/17/11 104/54   Pulse Readings from Last 1 Encounters:  10/17/11 93   Resp Readings from Last 1 Encounters:  10/17/11 5   Temp Readings from Last 1 Encounters:  10/17/11 99.2 F (37.3 C) Oral    Hemodynamics: PAP: (37-46)/(18-26) 43/22 mmHg CO:  [2.5 L/min] 2.5 L/min CI:  [1.9 L/min/m2] 1.9 L/min/m2  Physical Exam:  Rhythm:   sinus  Breath sounds: clear  Heart sounds:  RRR no murmur  Incisions:  dry  Abdomen:  Soft, mildly distended, non tender  Extremities:  Warm, well perfused   Intake/Output from previous day: 02/06 0701 - 02/07 0700 In: 2145.9 [P.O.:300; I.V.:1495.9; IV Piggyback:350] Out: 0981 [XBJYN:8295; Chest Tube:1030] Intake/Output this shift:    Lab Results:  Basename 10/17/11 0424 10/16/11 1800  WBC 13.0* 13.9*  HGB 8.7* 9.2*  HCT 25.5* 26.5*  PLT 62* 71*   BMET:  Basename 10/17/11 0424 10/16/11 1800 10/16/11 1742 10/16/11 0355  NA 138 -- 135 --  K 3.9 -- 3.8 --  CL 105 -- 100 --  CO2 27 -- -- 24  GLUCOSE 139* -- 148* --  BUN 15 -- 15 --  CREATININE 0.50 0.40* -- --  CALCIUM 7.6* -- -- 6.6*    CBG (last 3)   Basename 10/17/11 0433 10/16/11 2359 10/16/11 2029  GLUCAP 140* 104* 132*   ABG    Component Value Date/Time   PHART 7.356 10/16/2011 0647   HCO3 21.4 10/16/2011 0647   TCO2 23 10/16/2011 1742   ACIDBASEDEF 4.0* 10/16/2011 0647   O2SAT 94.0 10/16/2011 0647   CXR: Looks good.  Left effusion resolved.  Assessment/Plan: S/P Procedure(s) (LRB): MINIMALLY INVASIVE MITRAL VALVE REPAIR (MVR) (Right)  Doing well POD2 Stable rhythm and BP but still on low dose neo Volume excess, diuresing spontaneously overnight Anemia  stable Thrombocytopenia slightly worse Possible post op ileus   D/C left chest tube  Wean neo as tolerated  Mobilize  Clear liquids only po  Leave foley due to diuresis and limited mobility   Laneice Meneely H 10/17/2011 7:47 AM

## 2011-10-17 NOTE — Evaluation (Signed)
Occupational Therapy Evaluation Patient Details Name: Michaela [name] MRN: [id number] DOB: 12/22/1955 Today's Date: 10/17/2011  Problem List:  Patient Active Problem List  Diagnoses  . Mitral regurgitation due to cusp prolapse  . S/P mitral valve repair  . Brain tumor, astrocytoma  . Neuromuscular disorder  . Neurogenic bladder  . Arthritis    Past Medical History:  Past Medical History  Diagnosis Date  . 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 MVP (mitral valve prolapse)   . Mitral regurgitation   . Shortness of breath   . Heart murmur   . Neuromuscular disorder     numbness in hands and feet  . Arthritis   . Overactive bladder    Past Surgical History:  Past Surgical History  Procedure Date  . Ventriculoperitoneal shunt 1971  . Knee arthroscopy     right  . Cardiac catheterization     Jan 2013  . Transesophageal echocardiogram 7/12  . US echocardiography 6/12  . Mitral valve repair 10/15/2011    Procedure: MINIMALLY INVASIVE MITRAL VALVE REPAIR (MVR);  Surgeon: Purcell Nails, MD;  Location: [facility] OR;  Service: Open Heart Surgery;  Laterality: Right;    OT Assessment/Plan/Recommendation OT Assessment Clinical Impression Statement: Pt. presents s/p MVR and with limited activity tolerance and increased pain affecting inependence with ADLs. Will benefit from skilled OT to increase functional independence to min assist level at D/C. OT Recommendation/Assessment: Patient will need skilled OT in the acute care venue OT Problem List: Decreased strength;Decreased activity tolerance;Impaired balance (sitting and/or standing);Decreased safety awareness;Decreased knowledge of use of DME or AE;Decreased knowledge of precautions Barriers to Discharge: None OT Therapy Diagnosis : Generalized weakness OT Plan OT Frequency: Min 1X/week OT Treatment/Interventions: Self-care/ADL training;Energy conservation;Therapeutic activities;Patient/family education;Balance  training OT Recommendation Follow Up Recommendations: Skilled nursing facility Equipment Recommended: Defer to next venue Individuals Consulted Consulted and Agree with Results and Recommendations: Patient OT Goals Acute Rehab OT Goals OT Goal Formulation: With patient Time For Goal Achievement: 2 weeks ADL Goals Pt Will Perform Grooming: with set-up;with supervision;Standing at sink ADL Goal: Grooming - Progress: Goal set today Pt Will Perform Lower Body Bathing: with min assist;Sit to stand from chair ADL Goal: Lower Body Bathing - Progress: Goal set today Pt Will Perform Lower Body Dressing: with min assist;with set-up;with adaptive equipment;Sit to stand from chair ADL Goal: Lower Body Dressing - Progress: Goal set today Pt Will Transfer to Toilet: with min assist;with set-up;3-in-1;with DME;Ambulation ADL Goal: Toilet Transfer - Progress: Goal set today Pt Will Perform Toileting - Hygiene: Sit to stand from 3-in-1/toilet;with min assist ADL Goal: Toileting - Hygiene - Progress: Goal set today  OT Evaluation Precautions/Restrictions  Precautions Precautions: Fall Precaution Comments: multiple lines and bil chest tubes Required Braces or Orthoses: No Restrictions Weight Bearing Restrictions: No Prior Functioning Home Living Lives With: Alone Receives Help From: Personal care attendant;Family Type of Home: House Home Layout: One level Home Access: Ramped entrance Bathroom Shower/Tub: Engineer, manufacturing systems: Handicapped height Bathroom Accessibility: Yes How Accessible: Accessible via wheelchair Home Adaptive Equipment: Tub transfer bench;Wheelchair - manual;Grab bars around toilet Prior Function Level of Independence: Needs assistance with ADLs;Needs assistance with homemaking;Independent with transfers;Requires assistive device for independence Bath: Maximal Dressing: Maximal Meal Prep: Total (microwave meals, family prepares) Light Housekeeping:  Total Driving: No Vocation: Unemployed ADL ADL Eating/Feeding: Simulated;Set up Where Assessed - Eating/Feeding: Chair Grooming: Simulated;Wash/dry face;Teeth care;Minimal assistance Grooming Details (indicate cue type and reason): Pt. with decreased use of  left UE due to IV placement and required assist for unscrewing top of toothpaste Where Assessed - Grooming: Sitting, chair Upper Body Bathing: Simulated;Chest;Right arm;Left arm;Abdomen;Maximal assistance Where Assessed - Upper Body Bathing: Sitting, chair Lower Body Bathing: Simulated;+1 Total assistance Where Assessed - Lower Body Bathing: Sit to stand from chair Upper Body Dressing: Simulated;Maximal assistance Where Assessed - Upper Body Dressing: Sitting, chair Lower Body Dressing: Simulated;+1 Total assistance Where Assessed - Lower Body Dressing: Sit to stand from chair Toilet Transfer: Simulated;Moderate assistance Toilet Transfer Details (indicate cue type and reason): mod verbal cues for safety and hand placement Toilet Transfer Method: Stand pivot Toilet Transfer Equipment: Other (comment) Nurse, children's) Toileting - Clothing Manipulation: Simulated;Maximal assistance Where Assessed - Glass blower/designer Manipulation: Standing Toileting - Hygiene: Simulated;Maximal assistance Where Assessed - Toileting Hygiene: Sit on 3-in-1 or toilet Tub/Shower Transfer: Not assessed Tub/Shower Transfer Method: Not assessed Equipment Used: Rolling walker Ambulation Related to ADLs: Pt provided with mod assist ~4' with RW and mod verbal cues for safety and hand placement Vision/Perception  Vision - History Baseline Vision: Wears glasses only for reading Vision - Assessment Eye Alignment: Impaired (comment) Vision Assessment: Vision not tested Additional Comments: Pt. reports poor vision at baseline. Rt. eye with medial alignment. Cognition Cognition Arousal/Alertness: Awake/alert Overall Cognitive Status: Appears within functional  limits for tasks assessed Orientation Level: Oriented X4 Cognition - Other Comments: Pt. requested to have the calendar changed to the correct date. Sensation/Coordination Coordination Gross Motor Movements are Fluid and Coordinated: Yes (Pt. with mild resting head tremulous movements) Fine Motor Movements are Fluid and Coordinated: No (decreased left UEdue to IV line placement resulting in edema) Extremity Assessment RUE Assessment RUE Assessment: Within Functional Limits LUE Assessment LUE Assessment: Exceptions to WFL (Limited by lines, however pt. reports [facility]) Mobility  Bed Mobility Bed Mobility: No Transfers Transfers: Yes Sit to Stand: 3: Mod assist;From chair/3-in-1;With armrests Sit to Stand Details (indicate cue type and reason): cueing for hand placement, safety and assist for anterior weight shift, +1 throughout for line management and safety, 2 trials Stand to Sit: To chair/3-in-1;3: Mod assist;With armrests Stand to Sit Details: cueing for safety, 2 trials    End of Session OT - End of Session Equipment Utilized During Treatment: Gait belt Activity Tolerance: Patient tolerated treatment well Patient left: in chair;with call bell in reach;Other (comment) (sitter present) Nurse Communication: Mobility status for transfers General Behavior During Session: [facility] for tasks performed Cognition: [facility] for tasks performed  Co-evaluation with Flora Lipps, OTR/L Pager [phone] 10/17/2011, 8:51 AM

## 2011-10-18 ENCOUNTER — Inpatient Hospital Stay (HOSPITAL_COMMUNITY): Payer: Medicare Other

## 2011-10-18 LAB — CBC
Platelets: 45 10*3/uL — ABNORMAL LOW (ref 150–400)
RBC: 2.72 MIL/uL — ABNORMAL LOW (ref 3.87–5.11)
RDW: 14.8 % (ref 11.5–15.5)
WBC: 7 10*3/uL (ref 4.0–10.5)

## 2011-10-18 LAB — BASIC METABOLIC PANEL
BUN: 14 mg/dL (ref 6–23)
Creatinine, Ser: 0.59 mg/dL (ref 0.50–1.10)
GFR calc Af Amer: >90 mL/min (ref >90)
GFR calc non Af Amer: >90 mL/min (ref >90)
Glucose, Bld: 106 mg/dL — ABNORMAL HIGH (ref 70–99)

## 2011-10-18 LAB — GLUCOSE, CAPILLARY
Glucose-Capillary: 107 mg/dL — ABNORMAL HIGH (ref 70–99)
Glucose-Capillary: 111 mg/dL — ABNORMAL HIGH (ref 70–99)

## 2011-10-18 LAB — PROTIME-INR
INR: 1.49 (ref 0.00–1.49)
Prothrombin Time: 18.3 seconds — ABNORMAL HIGH (ref 11.6–15.2)

## 2011-10-18 MED ORDER — DOCUSATE SODIUM 100 MG PO CAPS
200.0000 mg | ORAL_CAPSULE | Freq: Every day | ORAL | Status: DC
  Administered 2011-10-18 – 2011-10-29 (×11): 200 mg via ORAL
  Filled 2011-10-18 (×13): qty 2

## 2011-10-18 MED ORDER — PANTOPRAZOLE SODIUM 40 MG PO TBEC
40.0000 mg | DELAYED_RELEASE_TABLET | Freq: Every day | ORAL | Status: DC
  Administered 2011-10-19 – 2011-10-29 (×11): 40 mg via ORAL
  Filled 2011-10-18 (×9): qty 1

## 2011-10-18 MED ORDER — METOPROLOL TARTRATE 25 MG PO TABS
25.0000 mg | ORAL_TABLET | Freq: Two times a day (BID) | ORAL | Status: DC
  Administered 2011-10-18 – 2011-10-19 (×3): 25 mg via ORAL
  Filled 2011-10-18 (×6): qty 1

## 2011-10-18 MED ORDER — SODIUM CHLORIDE 0.9 % IJ SOLN: 3.0000 mL | INTRAMUSCULAR | Status: DC | PRN

## 2011-10-18 MED ORDER — TOLTERODINE TARTRATE ER 4 MG PO CP24
4.0000 mg | ORAL_CAPSULE | Freq: Every day | ORAL | Status: DC
  Administered 2011-10-18 – 2011-10-29 (×12): 4 mg via ORAL
  Filled 2011-10-18 (×13): qty 1

## 2011-10-18 MED ORDER — FUROSEMIDE 20 MG PO TABS
20.0000 mg | ORAL_TABLET | Freq: Two times a day (BID) | ORAL | Status: DC
  Administered 2011-10-19 – 2011-10-20 (×3): 20 mg via ORAL
  Filled 2011-10-18 (×5): qty 1

## 2011-10-18 MED ORDER — SIMVASTATIN 40 MG PO TABS
40.0000 mg | ORAL_TABLET | Freq: Every day | ORAL | Status: DC
  Administered 2011-10-18 – 2011-10-28 (×11): 40 mg via ORAL
  Filled 2011-10-18 (×12): qty 1

## 2011-10-18 MED ORDER — MAGNESIUM HYDROXIDE 400 MG/5ML PO SUSP: 30.0000 mL | Freq: Every day | ORAL | Status: DC | PRN

## 2011-10-18 MED ORDER — ONDANSETRON HCL 4 MG PO TABS: 4.0000 mg | ORAL_TABLET | Freq: Four times a day (QID) | ORAL | Status: DC | PRN

## 2011-10-18 MED ORDER — FUROSEMIDE 10 MG/ML IJ SOLN
20.0000 mg | Freq: Three times a day (TID) | INTRAMUSCULAR | Status: AC
  Administered 2011-10-18 (×3): 20 mg via INTRAVENOUS
  Filled 2011-10-18 (×3): qty 2

## 2011-10-18 MED ORDER — COUMADIN BOOK
Freq: Once | Status: AC
  Administered 2011-10-18: 16:00:00
  Filled 2011-10-18: qty 1

## 2011-10-18 MED ORDER — WARFARIN VIDEO
Freq: Once | Status: AC
  Administered 2011-10-18: 19:00:00

## 2011-10-18 MED ORDER — POTASSIUM CHLORIDE CRYS ER 20 MEQ PO TBCR
20.0000 meq | EXTENDED_RELEASE_TABLET | Freq: Two times a day (BID) | ORAL | Status: DC
  Administered 2011-10-19 (×2): 20 meq via ORAL
  Filled 2011-10-18 (×4): qty 1

## 2011-10-18 MED ORDER — POLYETHYLENE GLYCOL 3350 17 G PO PACK
17.0000 g | PACK | Freq: Every day | ORAL | Status: DC
  Administered 2011-10-18 – 2011-10-29 (×11): 17 g via ORAL
  Filled 2011-10-18 (×12): qty 1

## 2011-10-18 MED ORDER — MOVING RIGHT ALONG BOOK
Freq: Once | Status: AC
  Administered 2011-10-18: 08:00:00
  Filled 2011-10-18: qty 1

## 2011-10-18 MED ORDER — BISACODYL 10 MG RE SUPP: 10.0000 mg | Freq: Every day | RECTAL | Status: DC | PRN

## 2011-10-18 MED ORDER — MELOXICAM 15 MG PO TABS
15.0000 mg | ORAL_TABLET | Freq: Every day | ORAL | Status: DC | PRN
  Filled 2011-10-18: qty 1

## 2011-10-18 MED ORDER — BISACODYL 5 MG PO TBEC: 10.0000 mg | DELAYED_RELEASE_TABLET | Freq: Every day | ORAL | Status: DC | PRN

## 2011-10-18 MED ORDER — SODIUM CHLORIDE 0.9 % IV SOLN: 250.0000 mL | INTRAVENOUS | Status: DC | PRN

## 2011-10-18 MED ORDER — POTASSIUM CHLORIDE 10 MEQ/50ML IV SOLN
10.0000 meq | INTRAVENOUS | Status: DC | PRN
  Administered 2011-10-18 (×2): 10 meq via INTRAVENOUS
  Filled 2011-10-18: qty 150

## 2011-10-18 MED ORDER — SODIUM CHLORIDE 0.9 % IJ SOLN
3.0000 mL | Freq: Two times a day (BID) | INTRAMUSCULAR | Status: DC
  Administered 2011-10-18 – 2011-10-22 (×8): 3 mL via INTRAVENOUS
  Administered 2011-10-22: 10:00:00 via INTRAVENOUS
  Administered 2011-10-23 – 2011-10-29 (×7): 3 mL via INTRAVENOUS

## 2011-10-18 MED ORDER — TRAMADOL HCL 50 MG PO TABS
50.0000 mg | ORAL_TABLET | ORAL | Status: DC | PRN
  Administered 2011-10-18 – 2011-10-21 (×7): 50 mg via ORAL
  Administered 2011-10-26: 100 mg via ORAL
  Filled 2011-10-18: qty 1
  Filled 2011-10-18: qty 2
  Filled 2011-10-18 (×6): qty 1

## 2011-10-18 MED ORDER — ONDANSETRON HCL 4 MG/2ML IJ SOLN: 4.0000 mg | Freq: Four times a day (QID) | INTRAMUSCULAR | Status: DC | PRN

## 2011-10-18 MED ORDER — POTASSIUM CHLORIDE 10 MEQ/50ML IV SOLN
10.0000 meq | INTRAVENOUS | Status: AC
  Administered 2011-10-18 (×2): 10 meq via INTRAVENOUS
  Filled 2011-10-18: qty 100

## 2011-10-18 NOTE — Progress Notes (Signed)
Reviewed charts and pt is mostly w/c bound. Will f/u for ed.  Ethelda Chick CES, ACSM

## 2011-10-18 NOTE — Progress Notes (Signed)
Pt BP upon transfer 84/53 and 83/50 pt asymptomatic, Dr. Cornelius Moras made aware, will continue to monitor patient. Jarrod Mcenery, Randall An RN

## 2011-10-18 NOTE — Plan of Care (Signed)
Problem: Phase III Progression Outcomes Goal: Time patient transferred to PCTU/Telemetry POD Outcome: Completed/Met Date Met:  10/18/11 1330

## 2011-10-18 NOTE — Progress Notes (Signed)
   CARDIOTHORACIC SURGERY PROGRESS NOTE   R3 Days Post-Op Procedure(s) (LRB): MINIMALLY INVASIVE MITRAL VALVE REPAIR (MVR) (Right)  Subjective: Feels well. No complaints. Looks good.  Objective: Vital signs: BP Readings from Last 1 Encounters:  10/18/11 94/53   Pulse Readings from Last 1 Encounters:  10/18/11 97   Resp Readings from Last 1 Encounters:  10/18/11 9   Temp Readings from Last 1 Encounters:  10/18/11 98.2 F (36.8 C) Oral    Hemodynamics:    Physical Exam:  Rhythm:   sinus  Breath sounds: clear  Heart sounds:  RRR no murmur  Incisions:  Clean and dry  Abdomen:  Soft, non distended, non tender  Extremities:  Warm, well perfused, swelling decreasing   Intake/Output from previous day: 02/07 0701 - 02/08 0700 In: 1587.8 [P.O.:850; I.V.:533.8; IV Piggyback:204] Out: 3245 [Urine:2855; Chest Tube:390] Intake/Output this shift: Total I/O In: 50 [IV Piggyback:50] Out: -   Lab Results:  Basename 10/18/11 0410 10/17/11 0424  WBC 7.0 13.0*  HGB 8.1* 8.7*  HCT 24.3* 25.5*  PLT 45* 62*   BMET:  Basename 10/18/11 0410 10/17/11 0424  NA 141 138  K 3.7 3.9  CL 107 105  CO2 30 27  GLUCOSE 106* 139*  BUN 14 15  CREATININE 0.59 0.50  CALCIUM 7.4* 7.6*    CBG (last 3)   Basename 10/18/11 0345 10/18/11 0020 10/17/11 2021  GLUCAP 103* 116* 93   ABG    Component Value Date/Time   PHART 7.356 10/16/2011 0647   HCO3 21.4 10/16/2011 0647   TCO2 23 10/16/2011 1742   ACIDBASEDEF 4.0* 10/16/2011 0647   O2SAT 94.0 10/16/2011 0647   CXR: Not done  Assessment/Plan: S/P Procedure(s) (LRB): MINIMALLY INVASIVE MITRAL VALVE REPAIR (MVR) (Right)  Doing well POD 3 Anemia slightly worse Thrombocytopenia worse but no clinical signs of HITT Volume excess diuresing   D/C chest tubes  Transfer step down  Continue diuresis  D/C aspirin.  No lovenox. Watch platelet count.  Continue coumadin  Mobilize  PT consult  Possibly ready for d/c to [facility] by  Monday  [name],[name] 10/18/2011 7:51 AM

## 2011-10-18 NOTE — Progress Notes (Addendum)
CSW note possible d/c Monday. Facility advised and CSW will continue to follow to coordinate d/c to SNF when pt medically ready. CSW advised pt Case Manager at [facility]) of pt disposition.   Baxter Flattery, MSW [phone]

## 2011-10-19 ENCOUNTER — Inpatient Hospital Stay (HOSPITAL_COMMUNITY): Payer: Medicare Other

## 2011-10-19 LAB — CBC
Hemoglobin: 8.9 g/dL — ABNORMAL LOW (ref 12.0–15.0)
MCH: 29.9 pg (ref 26.0–34.0)
Platelets: 79 10*3/uL — ABNORMAL LOW (ref 150–400)
RBC: 2.98 MIL/uL — ABNORMAL LOW (ref 3.87–5.11)
WBC: 11.5 10*3/uL — ABNORMAL HIGH (ref 4.0–10.5)

## 2011-10-19 LAB — TYPE AND SCREEN
Antibody Screen: NEGATIVE
Unit division: 0
Unit division: 0

## 2011-10-19 LAB — BASIC METABOLIC PANEL
CO2: 30 mEq/L (ref 19–32)
Calcium: 8.3 mg/dL — ABNORMAL LOW (ref 8.4–10.5)
Chloride: 103 mEq/L (ref 96–112)
Glucose, Bld: 106 mg/dL — ABNORMAL HIGH (ref 70–99)
Potassium: 3.8 mEq/L (ref 3.5–5.1)
Sodium: 138 mEq/L (ref 135–145)

## 2011-10-19 LAB — PROTIME-INR: Prothrombin Time: 18 seconds — ABNORMAL HIGH (ref 11.6–15.2)

## 2011-10-19 MED ORDER — DM-GUAIFENESIN ER 30-600 MG PO TB12
1.0000 | ORAL_TABLET | Freq: Two times a day (BID) | ORAL | Status: DC | PRN
  Administered 2011-10-19 – 2011-10-21 (×3): 1 via ORAL
  Filled 2011-10-19 (×3): qty 1

## 2011-10-19 NOTE — Progress Notes (Addendum)
[address]            [place] [address]          [phone]     4 Days Post-Op  Procedure(s) (LRB): MINIMALLY INVASIVE MITRAL VALVE REPAIR (MVR) (Right) Subjective: c/o back pain  Objective  Telemetry sinus tachy  Temp:  [98.2 F (36.8 C)-98.7 F (37.1 C)] 98.6 F (37 C) (02/09 0439) Pulse Rate:  [81-112] 105  (02/09 1003) Resp:  [16-17] 17  (02/09 0439) BP: (83-125)/(37-78) 125/78 mmHg (02/09 1003) SpO2:  [95 %-100 %] 100 % (02/09 0439) Weight:  [95 lb 12.8 oz (43.455 kg)] 95 lb 12.8 oz (43.455 kg) (02/09 0439)   Intake/Output Summary (Last 24 hours) at 10/19/11 1008 Last data filed at 10/19/11 0800  Gross per 24 hour  Intake    720 ml  Output   1150 ml  Net   -430 ml       General appearance: alert, cooperative and no distress Heart: regular rate and rhythm and soft murmur, + rub Lungs: coarse ronchi scattered Abdomen: soft, nontender, non distended Extremities: minimal edema Wound: incisions healing well  Lab Results:  Basename 10/19/11 0630 10/18/11 0410 10/16/11 1800  NA 138 141 --  K 3.8 3.7 --  CL 103 107 --  CO2 30 30 --  GLUCOSE 106* 106* --  BUN 18 14 --  CREATININE 0.62 0.59 --  CALCIUM 8.3* 7.4* --  MG -- -- 2.2  PHOS -- -- --   No results found for this basename: AST:2,ALT:2,ALKPHOS:2,BILITOT:2,PROT:2,ALBUMIN:2 in the last 72 hours No results found for this basename: LIPASE:2,AMYLASE:2 in the last 72 hours  Basename 10/19/11 0630 10/18/11 0410  WBC 11.5* 7.0  NEUTROABS -- --  HGB 8.9* 8.1*  HCT 27.3* 24.3*  MCV 91.6 89.3  PLT 79* 45*   No results found for this basename: CKTOTAL:4,CKMB:4,TROPONINI:4 in the last 72 hours No components found with this basename: POCBNP:3 No results found for this basename: DDIMER in the last 72 hours No results found for this basename: HGBA1C in the last 72 hours No results found for this basename: CHOL,HDL,LDLCALC,TRIG,CHOLHDL in the last 72 hours No results  found for this basename: TSH,T4TOTAL,FREET3,T3FREE,THYROIDAB in the last 72 hours No results found for this basename: VITAMINB12,FOLATE,FERRITIN,TIBC,IRON,RETICCTPCT in the last 72 hours  Medications: Scheduled    . acetaminophen  1,000 mg Oral Q6H   Or  . acetaminophen (TYLENOL) oral liquid 160 mg/5 mL  975 mg Per Tube Q6H  . bisacodyl  10 mg Oral Daily   Or  . bisacodyl  10 mg Rectal Daily  . coumadin book   Does not apply Once  . docusate sodium  200 mg Oral Daily  . furosemide  20 mg Intravenous TID BM  . furosemide  20 mg Oral BID  . imipramine  25 mg Oral QHS  . metoprolol tartrate  25 mg Oral BID  . pantoprazole  40 mg Oral QAC breakfast  . polyethylene glycol  17 g Oral Daily  . potassium chloride  20 mEq Oral BID  . simvastatin  40 mg Oral q1800  . sodium chloride  3 mL Intravenous Q12H  . Somatropin  0.5333 mg Subcutaneous QPM  . tolterodine  4 mg Oral Daily  . warfarin  2.5 mg Oral q1800  . warfarin   Does not apply Once     Radiology/Studies:  Dg Chest  2 View  10/19/2011  *RADIOLOGY REPORT*  Clinical Data: Status post mitral valve replacement surgery.  CHEST - 2 VIEW  Comparison: 10/18/2011  Findings: Right chest tube and mediastinal drains have been removed.   No evidence of pneumothorax.  Tiny bilateral pleural effusions remain.  Stable areas of bilateral atelectasis.  No overt edema.  Stable cardiac and mediastinal contours.  IMPRESSION: Stable bilateral atelectasis.  No pneumothorax visualized.  Original Report Authenticated By: Reola Calkins, M.D.   Dg Chest Port 1 View  10/18/2011  *RADIOLOGY REPORT*  Clinical Data: Postop  PORTABLE CHEST - 1 VIEW  Comparison: Yesterday  Findings: Left chest tube removed.  Right chest tubes stable.  Left internal jugular vein introducer sheath stable.  Stable appearance of the lungs.  No pneumothorax.  Stable cardiac silhouette.  IMPRESSION: Left chest tube removed without pneumothorax.  Otherwise stable.  Original Report  Authenticated By: Donavan Burnet, M.D.    INR:1.46 Will add last result for INR, ABG once components are confirmed Will add last 4 CBG results once components are confirmed  Assessment/Plan: S/P Procedure(s) (LRB): MINIMALLY INVASIVE MITRAL VALVE REPAIR (MVR) (Right)  1. Doing well, push pulm toilet as able, add flutter valve, mucinex 2. Cont AC rx, cont beta blocker 3. Gentle diuresis 4. Platelets improving 5. cbg well controlled  LOS: 4 days    GOLD,WAYNE E 2/9/201310:08 AM   patient examined and medical record reviewed,agree with above note If platelets continue to increase start Lovenox 30 Q HS. VAN TRIGT III,Argyle Gustafson 10/19/2011

## 2011-10-20 LAB — PROTIME-INR
INR: 1.54 — ABNORMAL HIGH (ref 0.00–1.49)
Prothrombin Time: 18.8 seconds — ABNORMAL HIGH (ref 11.6–15.2)

## 2011-10-20 MED ORDER — METOPROLOL TARTRATE 25 MG PO TABS
37.5000 mg | ORAL_TABLET | Freq: Two times a day (BID) | ORAL | Status: DC
  Administered 2011-10-20 – 2011-10-23 (×7): 37.5 mg via ORAL
  Filled 2011-10-20 (×10): qty 1

## 2011-10-20 NOTE — Progress Notes (Signed)
[address]            [place] [address]          [phone]     5 Days Post-Op  Procedure(s) (LRB): MINIMALLY INVASIVE MITRAL VALVE REPAIR (MVR) (Right) Subjective: Feels better, did have episode of SOB, quickly resolved  Objective  Telemetry NSR, Sinus tachy  Temp:  [97.6 F (36.4 C)-99.6 F (37.6 C)] 97.6 F (36.4 C) (02/10 0506) Pulse Rate:  [82-105] 82  (02/10 0506) Resp:  [16-18] 18  (02/10 0506) BP: (82-125)/(44-78) 96/58 mmHg (02/10 0506) SpO2:  [94 %-97 %] 97 % (02/10 0506) Weight:  [98 lb 1.6 oz (44.498 kg)] 98 lb 1.6 oz (44.498 kg) (02/10 0506)   Intake/Output Summary (Last 24 hours) at 10/20/11 0932 Last data filed at 10/20/11 0500  Gross per 24 hour  Intake    240 ml  Output    700 ml  Net   -460 ml       General appearance: alert, cooperative and no distress Heart: regular rate and rhythm and S1, S2 normal Lungs: clear to auscultation bilaterally Abdomen: soft, non-tender; bowel sounds normal; no masses,  no organomegaly Extremities: no edema, redness or tenderness in the calves or thighs Wound: incisions healing well  Lab Results:  Basename 10/19/11 0630 10/18/11 0410  NA 138 141  K 3.8 3.7  CL 103 107  CO2 30 30  GLUCOSE 106* 106*  BUN 18 14  CREATININE 0.62 0.59  CALCIUM 8.3* 7.4*  MG -- --  PHOS -- --   No results found for this basename: AST:2,ALT:2,ALKPHOS:2,BILITOT:2,PROT:2,ALBUMIN:2 in the last 72 hours No results found for this basename: LIPASE:2,AMYLASE:2 in the last 72 hours  Basename 10/19/11 0630 10/18/11 0410  WBC 11.5* 7.0  NEUTROABS -- --  HGB 8.9* 8.1*  HCT 27.3* 24.3*  MCV 91.6 89.3  PLT 79* 45*   No results found for this basename: CKTOTAL:4,CKMB:4,TROPONINI:4 in the last 72 hours No components found with this basename: POCBNP:3 No results found for this basename: DDIMER in the last 72 hours No results found for this basename: HGBA1C in the last 72 hours No results found for  this basename: CHOL,HDL,LDLCALC,TRIG,CHOLHDL in the last 72 hours No results found for this basename: TSH,T4TOTAL,FREET3,T3FREE,THYROIDAB in the last 72 hours No results found for this basename: VITAMINB12,FOLATE,FERRITIN,TIBC,IRON,RETICCTPCT in the last 72 hours  Medications: Scheduled    . acetaminophen  1,000 mg Oral Q6H   Or  . acetaminophen (TYLENOL) oral liquid 160 mg/5 mL  975 mg Per Tube Q6H  . bisacodyl  10 mg Oral Daily   Or  . bisacodyl  10 mg Rectal Daily  . docusate sodium  200 mg Oral Daily  . furosemide  20 mg Oral BID  . imipramine  25 mg Oral QHS  . metoprolol tartrate  25 mg Oral BID  . pantoprazole  40 mg Oral QAC breakfast  . polyethylene glycol  17 g Oral Daily  . potassium chloride  20 mEq Oral BID  . simvastatin  40 mg Oral q1800  . sodium chloride  3 mL Intravenous Q12H  . Somatropin  0.5333 mg Subcutaneous QPM  . tolterodine  4 mg Oral Daily  . warfarin  2.5 mg Oral q1800     Radiology/Studies:  Dg Chest 2 View  10/19/2011  *RADIOLOGY REPORT*  Clinical Data: Status post mitral valve replacement surgery.  CHEST -  2 VIEW  Comparison: 10/18/2011  Findings: Right chest tube and mediastinal drains have been removed.   No evidence of pneumothorax.  Tiny bilateral pleural effusions remain.  Stable areas of bilateral atelectasis.  No overt edema.  Stable cardiac and mediastinal contours.  IMPRESSION: Stable bilateral atelectasis.  No pneumothorax visualized.  Original Report Authenticated By: Reola Calkins, M.D.    INR:1.54 Will add last result for INR, ABG once components are confirmed Will add last 4 CBG results once components are confirmed  Assessment/Plan: S/P Procedure(s) (LRB): MINIMALLY INVASIVE MITRAL VALVE REPAIR (MVR) (Right) 1.Doing well overall 2. Wean O2 3 pulm toilet 4 rehab as able 5. Ac rx, increase Beta blocker dose 6. D/c diuretic 7. Recheck labs in am  LOS: 5 days    Vuk Skillern E 2/10/20139:32 AM

## 2011-10-21 DIAGNOSIS — I059 Rheumatic mitral valve disease, unspecified: Secondary | ICD-10-CM

## 2011-10-21 LAB — PROTIME-INR
INR: 1.44 (ref 0.00–1.49)
Prothrombin Time: 17.8 seconds — ABNORMAL HIGH (ref 11.6–15.2)

## 2011-10-21 LAB — HEPARIN INDUCED THROMBOCYTOPENIA PNL
Heparin Induced Plt Ab: NEGATIVE
Patient O.D.: 0.344
UFH High Dose UFH H: 0 % Release
UFH Low Dose 0.1 IU/mL: 0 % Release
UFH Low Dose 0.5 IU/mL: 0 % Release
UFH SRA Result: NEGATIVE

## 2011-10-21 LAB — BASIC METABOLIC PANEL
CO2: 29 mEq/L (ref 19–32)
Chloride: 105 mEq/L (ref 96–112)
GFR calc Af Amer: >90 mL/min (ref >90)
Potassium: 4.6 mEq/L (ref 3.5–5.1)
Sodium: 140 mEq/L (ref 135–145)

## 2011-10-21 LAB — CBC
Platelets: 100 10*3/uL — ABNORMAL LOW (ref 150–400)
RBC: 2.72 MIL/uL — ABNORMAL LOW (ref 3.87–5.11)
RDW: 15.1 % (ref 11.5–15.5)
WBC: 9 10*3/uL (ref 4.0–10.5)

## 2011-10-21 MED ORDER — DM-GUAIFENESIN ER 30-600 MG PO TB12: 1.0000 | ORAL_TABLET | Freq: Two times a day (BID) | ORAL | Status: AC | PRN

## 2011-10-21 MED ORDER — METOPROLOL TARTRATE 12.5 MG HALF TABLET: 37.5000 mg | ORAL_TABLET | Freq: Two times a day (BID) | ORAL | Status: DC

## 2011-10-21 MED ORDER — ENOXAPARIN SODIUM 30 MG/0.3ML ~~LOC~~ SOLN
30.0000 mg | Freq: Every day | SUBCUTANEOUS | Status: DC
  Administered 2011-10-22 – 2011-10-23 (×2): 30 mg via SUBCUTANEOUS
  Filled 2011-10-21 (×3): qty 0.3

## 2011-10-21 MED ORDER — WARFARIN SODIUM 5 MG PO TABS
5.0000 mg | ORAL_TABLET | Freq: Every day | ORAL | Status: DC
  Administered 2011-10-21 – 2011-10-25 (×5): 5 mg via ORAL
  Filled 2011-10-21 (×6): qty 1

## 2011-10-21 MED ORDER — TRAMADOL HCL 50 MG PO TABS: 50.0000 mg | ORAL_TABLET | ORAL | Status: DC | PRN

## 2011-10-21 MED ORDER — WARFARIN SODIUM 5 MG PO TABS: 5.0000 mg | ORAL_TABLET | Freq: Every day | ORAL | Status: DC

## 2011-10-21 NOTE — Progress Notes (Addendum)
Physician Discharge Summary  Patient ID: Michaela [name] MRN: [id number] DOB/AGE: 54/31/1957 54 y.o.  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) 2.History of brain tumor (s/p radiation) 3.History of arthritis 4.History of overactive bladder 5.History of neuromuscular disorder (numbness in hands and feet)  Discharge Diagnoses:  1.Severe mitral regurgitation secondary to bileaflet prolapse (Barlow's disease) 2.History of brain tumor (s/p radiation) 3.History of arthritis 4.History of overactive bladedr 5.History of neuromuscular disorder (numbness in hands and feet) 6.ABL anemia 7.Thrombocytopenia   Procedure (s):  Minimally-Invasive Mitral Valve Repair  Complex valvuloplasty including Alfieri edge-to-edge repair with Goretex cords x2  # 36 mm Sorin Memo 3D Ring Annuloplasty by Dr. Cornelius Moras 10/15/2011  History of Presenting Illness: This  is a 56 year old female from [place] with remote history of astrocytoma of the brai,n originally treated with radiation therapy more than 40 years ago. The patient also had a ventriculoperitoneal shunt for hydrocephalus at the time. She has managed remarkably well under the circumstances despite the fact that she has been left with chronic severe problems with instability of gait and spasticity. Despite this, she lives alone and cares for herself. She is for the most part confined to wheelchair although she seems to get around remarkably well under the circumstances. She reports that when she was a teenager, she was told that she had mitral valve prolapse. Approximately 12 years ago, she was noted to have a prominent murmur on exam and she was referred for a cardiology consultation. For years, she had been followed by Dr. Deborah Chalk; more recently, she has been followed by Dr. Shirlee Latch. She describes a long history of orthopnea with episodes of  paroxysmal nocturnal dyspnea. Some of these symptoms are associated with what the family terms "night terrors" but the patient reports that her primary problem is a sensation smothering or shortness of breath when she lays flat in bed. These symptoms are relieved by sitting up. More recently, the symptoms have progressed and she is also developed some lower extremity edema. She underwent transesophageal echocardiogram in July of this year confirming the presence of mitral valve prolapse with severe mitral regurgitation. She was started on oral Lasix therapy at that time. Symptoms have improved somewhat on Lasix therapy, but the patient still had some associated orthopnea and shortness of breath. She was referred to Dr. Cornelius Moras for consideration of elective mitral valve repair.She was initially seen and evaluated in the office by him on 10/14/2011. Potential risks, complications, and benefits of the surgery discussed with the patient she agreed to proceed. She's admitted to [facility] onto 2/5/2013in  order to undergo a minimally invasive mitral valve repair.   Brief Hospital Course:  She was extubated early the morning of postop day one without difficulty. She remained afebrile and hemodynamically stable. She was weaned off neo-. Her Swan-Ganz, A-line,  and Foley were all removed early in her postoperative course.Her chest tubes did remain for a couple of days but then were removed. She was found have acute blood loss anemia. Her H&H went as low as 8. And 25.. She did not require postoperative transfusion.She was volume overloaded and diuresed as blood pressure would allow. She was started on a low dose beta blocker, which was titrated accordingly.She was started on Coumadin and her PT/INR were monitored daily. She did have thrombocytopenia. Her platelet count went as low as 45,000;however, her last platelet count was up to 100,000. She was felt surgically stable for transfer from  the intensive care unit to [facility]  for further convalescence on 10/19/2011. Pacing wires were removed on 10/21/2011. She's already been tolerating a diet and has had a bowel movement. She was requiring a couple of liters of O2 via [place] and was able to be weaned off. Provided she remains afebrile, hemodynamically stable, and pending morning round evaluation, she'll be surgically stable for discharge to an SNF on 10/25/2011.  Filed Vitals:   10/21/11 0954  BP: 111/55  Pulse: 97  Temp: 98.2  Resp: 19     Latest Vital Signs: Blood pressure 111/55, pulse 97, temperature 98.2 F (36.8 C), temperature source Oral, resp. rate 19, height 4\' 10"  (1.473 m), weight 92 lb 4.8 oz (41.867 kg), SpO2 91.00%.  Physical Exam: Rhythm: sinus  Breath sounds: Fairly clear  Heart sounds: RRR, no murmur  Incisions: Clean and dry  Abdomen: soft  Extremities: Warm, no edema    Discharge Condition:Stable  Recent laboratory studies:  Lab Results  Component Value Date   WBC 9.0 10/21/2011   HGB 8.0* 10/21/2011   HCT 25.0* 10/21/2011   MCV 91.9 10/21/2011   PLT 100* 10/21/2011   Lab Results  Component Value Date   NA 140 10/21/2011   K 4.6 10/21/2011   CL 105 10/21/2011   CO2 29 10/21/2011   CREATININE 0.59 10/21/2011   GLUCOSE 102* 10/21/2011      Diagnostic Studies: Dg Chest 2 View  10/19/2011  *RADIOLOGY REPORT*  Clinical Data: Status post mitral valve replacement surgery.  CHEST - 2 VIEW  Comparison: 10/18/2011  Findings: Right chest tube and mediastinal drains have been removed.   No evidence of pneumothorax.  Tiny bilateral pleural effusions remain.  Stable areas of bilateral atelectasis.  No overt edema.  Stable cardiac and mediastinal contours.  IMPRESSION: Stable bilateral atelectasis.  No pneumothorax visualized.  Original Report Authenticated By: Reola Calkins, M.D.    Discharge Orders    Future Appointments: Provider: Department: Dept Phone: Center:   10/28/2011 3:30 PM Purcell Nails, MD [facility] [phone] [facility]       Discharge Medications: Medication List  As of 10/21/2011  1:09 PM   STOP taking these medications         amiodarone 200 MG tablet      aspirin 81 MG EC tablet         TAKE these medications         dextromethorphan-guaiFENesin 30-600 MG per 12 hr tablet   Commonly known as: MUCINEX DM   Take 1 tablet by mouth 2 (two) times daily as needed (cough).      docusate sodium 100 MG capsule   Commonly known as: COLACE   Take 200 mg by mouth every evening.      imipramine 25 MG tablet   Commonly known as: TOFRANIL   Take 25 mg by mouth at bedtime.      LASIX 20 MG tablet   Generic drug: furosemide   Take 1 tablet (20 mg total) by mouth daily.      meloxicam 15 MG tablet   Commonly known as: MOBIC   Take 15 mg by mouth daily as needed. For inflammation      metoprolol tartrate 12.5 mg Tabs   Commonly known as: LOPRESSOR   Take 1.5 tablets (37.5 mg total) by mouth 2 (two) times daily.      NORDITROPIN FLEXPRO 10 MG/1.5ML Soln   Generic drug: Somatropin   Inject 0.5 mg into the skin every  evening.      polyethylene glycol packet   Commonly known as: MIRALAX / GLYCOLAX   Take 17 g by mouth daily.      potassium chloride 10 MEQ tablet   Commonly known as: K-DUR   Take 1 tablet (10 mEq total) by mouth daily.      RECLAST 5 MG/100ML Soln   Generic drug: zoledronic acid   Inject 5 mg into the vein once. Once a year      simvastatin 40 MG tablet   Commonly known as: ZOCOR   Take 40 mg by mouth daily.      tolterodine 4 MG 24 hr capsule   Commonly known as: DETROL LA   Take 4 mg by mouth daily.      traMADol 50 MG tablet   Commonly known as: ULTRAM   Take 1-2 tablets (50-100 mg total) by mouth every 4 (four) hours as needed for pain.      warfarin 5 MG tablet   Commonly known as: COUMADIN   Take 1 tablet (5 mg total) by mouth daily at 6 PM. Or as directed by Dr. Alford Highland office.            Follow Up Appointments: Follow-up Information    SNF to draw  PT/INR Monday 10/28/2011 and fax results to Dr. Alford Highland office      Follow up with Purcell Nails, MD. (PA/LAT CXR to be taken on 2/18/213 at 2:30 pm;Appointment with Dr. Cornelius Moras is on 10/28/2011 at 3:30 pm.)    Contact information:   [address] [place] [address] [phone]       Follow up with [name], MD. (Call for an appointment for 2 weeks)    Contact information:   [address] [place] [address] [phone]          Signed: Doree Fudge MPA-C 10/21/2011, 1:09 PM

## 2011-10-21 NOTE — Progress Notes (Signed)
  Echocardiogram 2D Echocardiogram has been performed.  Michaela [name] 10/21/2011, 3:24 PM

## 2011-10-21 NOTE — Progress Notes (Addendum)
[id number] Cardiac Rehab Completed discharge education with pt. She is not appro for our service, unable to ambulate and is mostly confined to wheelchair. Cardiac Rehab goals met, will sign off. Patient is not appro for Outpt. CRP.

## 2011-10-21 NOTE — Progress Notes (Signed)
   CARE MANAGEMENT NOTE 10/21/2011  Patient:  Michaela [name],Michaela [name]   Account Number:  [id number]  Date Initiated:  10/16/2011  Documentation initiated by:  Jahzara Slattery  Subjective/Objective Assessment:   PT S/P MVR ON 10/15/11.     Action/Plan:   PTA, PT LIVES ALONE; PLANS TO DISCHARGE TO SNF FOR REHAB   Anticipated DC Date:  10/21/2011   Anticipated DC Plan:  SKILLED NURSING FACILITY  In-house referral  Clinical Social Worker      DC Planning Services  CM consult      Choice offered to / List presented to:             Status of service:  In process, will continue to follow Medicare Important Message given?   (If response is "NO", the following Medicare IM given date fields will be blank) Date Medicare IM given:   Date Additional Medicare IM given:    Discharge Disposition:    Per UR Regulation:    Comments:  10/21/11 Damaree Sargent,RN,BSN 1533 PLAN DC TO [facility], PER CSW ARRANGEMENTS WHEN STABLE, LIKELY 1-2 DAYS. Phone #[phone]   10/16/11 Lakin Romer,RN,BSN 1400 REFERRAL TO CSW TO FACILITATE DISCHARGE TO SNF FOR REHAB WHEN MEDICALLY STABLE.  WILL CONSULT PT AND OT. WILL FOLLOW. Phone #[phone]

## 2011-10-21 NOTE — Progress Notes (Signed)
D/C'd EPW per MD order and hospital policy.  Patient tolerated well.  No bleeding.  All ends intact.  Bedrest for 1 hour and vitals have been started. HR 93 B/P: 111/55.  Will continue to monitor.

## 2011-10-21 NOTE — Progress Notes (Signed)
Physical Therapy Treatment Patient Details Name: Michaela [name] MRN: [id number] DOB: 1956-01-15 Today's Date: 10/21/2011  PT Assessment/Plan  PT - Assessment/Plan Comments on Treatment Session: Pt s/p MVR. Pt progressing well and limited by baseline balance and ataxia. Pt stated she was too fatigued from transfers to attempt ambulation and baseline is mostly transfers. Pt performed own self care.Will continue to follow. PT Plan: Discharge plan remains appropriate;Frequency remains appropriate PT Goals  Acute Rehab PT Goals PT Goal: Supine/Side to Sit - Progress: Progressing toward goal PT Goal: Sit to Supine/Side - Progress: Progressing toward goal PT Goal: Sit to Stand - Progress: Progressing toward goal PT Goal: Stand to Sit - Progress: Progressing toward goal PT Goal: Ambulate - Progress: Progressing toward goal  PT Treatment Precautions/Restrictions  Precautions Precautions: Fall Precaution Comments: multiple lines and bil chest tubes Required Braces or Orthoses: No Restrictions Weight Bearing Restrictions: No Mobility (including Balance) Bed Mobility Bed Mobility: Yes Supine to Sit: 6: Modified independent (Device/Increase time);HOB flat Sitting - Scoot to Edge of Bed: 4: Min assist Sitting - Scoot to [place] of Bed Details (indicate cue type and reason): pt used PT arm as rail to pull to EOB, pt states she uses rails at home Transfers Sit to Stand: 4: Min assist;From chair/3-in-1;From bed;With armrests Sit to Stand Details (indicate cue type and reason): pt used PT arm as rail and assist for stability in standing while managing garments after voiding Stand to Sit: 3: Mod assist;To chair/3-in-1;With armrests Stand to Sit Details: assist to control descent and position on surface Stand Pivot Transfers: 3: Mod assist Stand Pivot Transfer Details (indicate cue type and reason): bed to [facility] to recliner with assist for balance and completion of  transfer Ambulation/Gait Ambulation/Gait: No Wheelchair Mobility Wheelchair Mobility: No  Posture/Postural Control Posture/Postural Control: Postural limitations Postural Limitations: flexed trunk Static Sitting Balance Static Sitting - Balance Support: Feet supported Static Sitting - Level of Assistance: 5: Stand by assistance Exercise  General Exercises - Lower Extremity Long Arc Quad: AROM;Both;20 reps;Seated Hip Flexion/Marching: AROM;Both;Seated;Other reps (comment) Awilda Bill) End of Session PT - End of Session Activity Tolerance: Patient limited by fatigue Patient left: in chair;with call bell in reach Nurse Communication: Mobility status for transfers General Behavior During Session: [facility], [facility], [facility] for tasks performed Cognition: [facility] for tasks performed  Delorse Lek 10/21/2011, 1:53 PM Toney Sang, PT [phone]

## 2011-10-21 NOTE — Progress Notes (Signed)
   CARDIOTHORACIC SURGERY PROGRESS NOTE  6 Days Post-Op  S/P Procedure(s) (LRB): MINIMALLY INVASIVE MITRAL VALVE REPAIR (MVR) (Right)  Subjective: Looks good.  Reports occasional difficulty getting a deep breath due to mild pain Objective: Vital signs in last 24 hours: Temp:  [98.2 F (36.8 C)-98.7 F (37.1 C)] 98.2 F (36.8 C) (02/11 0600) Pulse Rate:  [85-91] 88  (02/11 0600) Cardiac Rhythm:  [-] Normal sinus rhythm (02/11 0853) Resp:  [18-20] 20  (02/11 0600) BP: (89-100)/(49-62) 99/62 mmHg (02/11 0600) SpO2:  [91 %-98 %] 91 % (02/11 0600) Weight:  [41.867 kg (92 lb 4.8 oz)] 41.867 kg (92 lb 4.8 oz) (02/11 0340)  Physical Exam:  Rhythm:   sinus  Breath sounds: Fairly clear  Heart sounds:  RRR, no murmur  Incisions:  Clean and dry  Abdomen:  soft  Extremities:  Warm, no edema   Intake/Output from previous day: 02/10 0701 - 02/11 0700 In: 720 [P.O.:720] Out: 450 [Urine:450] Intake/Output this shift:    Lab Results:  Basename 10/21/11 0620 10/19/11 0630  WBC 9.0 11.5*  HGB 8.0* 8.9*  HCT 25.0* 27.3*  PLT 100* 79*   BMET:  Basename 10/21/11 0620 10/19/11 0630  NA 140 138  K 4.6 3.8  CL 105 103  CO2 29 30  GLUCOSE 102* 106*  BUN 21 18  CREATININE 0.59 0.62  CALCIUM 8.8 8.3*    CBG (last 3)   Basename 10/18/11 1220  GLUCAP 111*   PT/INR:   Basename 10/21/11 0620  LABPROT 17.8*  INR 1.44    CXR:  N/A  Assessment/Plan: S/P Procedure(s) (LRB): MINIMALLY INVASIVE MITRAL VALVE REPAIR (MVR) (Right)  Progressing fairly well POD6 Possibly ready for d/c to [facility] 1-2 days Will d/c pacing wires Increase coumadin dose Mobilize Check f/u ECHO for new baseline Check f/u CXR in am Wean O2 as tolerated  Hulda Reddix H 10/21/2011 9:15 AM

## 2011-10-22 ENCOUNTER — Other Ambulatory Visit: Payer: Self-pay | Admitting: Thoracic Surgery (Cardiothoracic Vascular Surgery)

## 2011-10-22 ENCOUNTER — Inpatient Hospital Stay (HOSPITAL_COMMUNITY): Payer: Medicare Other

## 2011-10-22 DIAGNOSIS — I059 Rheumatic mitral valve disease, unspecified: Secondary | ICD-10-CM

## 2011-10-22 LAB — PROTIME-INR
INR: 1.42 (ref 0.00–1.49)
Prothrombin Time: 17.6 seconds — ABNORMAL HIGH (ref 11.6–15.2)

## 2011-10-22 MED ORDER — FUROSEMIDE 20 MG PO TABS: 20.0000 mg | ORAL_TABLET | Freq: Every day | ORAL | Status: DC

## 2011-10-22 MED ORDER — FUROSEMIDE 10 MG/ML IJ SOLN
20.0000 mg | Freq: Two times a day (BID) | INTRAMUSCULAR | Status: AC
  Administered 2011-10-22 – 2011-10-23 (×3): 20 mg via INTRAVENOUS
  Filled 2011-10-22 (×3): qty 2

## 2011-10-22 NOTE — Progress Notes (Addendum)
[address]            [place] [address]          [phone]     7 Days Post-Op  Procedure(s) (LRB): MINIMALLY INVASIVE MITRAL VALVE REPAIR (MVR) (Right) Subjective: feels ok , weaned from 4 liters to 2 liters O2  Objective  Telemetry SR  Temp:  [96.9 F (36.1 C)-98.7 F (37.1 C)] 98.7 F (37.1 C) (02/12 0349) Pulse Rate:  [74-101] 86  (02/12 0349) Resp:  [16-20] 16  (02/12 0349) BP: (94-111)/(42-69) 99/42 mmHg (02/12 0349) SpO2:  [96 %-98 %] 96 % (02/12 0349) Weight:  [93 lb 12.8 oz (42.547 kg)] 93 lb 12.8 oz (42.547 kg) (02/12 0349)   Intake/Output Summary (Last 24 hours) at 10/22/11 0747 Last data filed at 10/22/11 0359  Gross per 24 hour  Intake    240 ml  Output    850 ml  Net   -610 ml       General appearance: alert and no distress Heart: regular rate and rhythm and S1, S2 normal Lungs: slight coarseness throughout Abdomen: soft, non-tender; bowel sounds normal; no masses,  no organomegaly Extremities: no edema, redness or tenderness in the calves or thighs Wound: incisions healing well without signs of infection  Lab Results:  [facility] 10/21/11 0620  NA 140  K 4.6  CL 105  CO2 29  GLUCOSE 102*  BUN 21  CREATININE 0.59  CALCIUM 8.8  MG --  PHOS --   No results found for this basename: AST:2,ALT:2,ALKPHOS:2,BILITOT:2,PROT:2,ALBUMIN:2 in the last 72 hours No results found for this basename: LIPASE:2,AMYLASE:2 in the last 72 hours  Basename 10/21/11 0620  WBC 9.0  NEUTROABS --  HGB 8.0*  HCT 25.0*  MCV 91.9  PLT 100*   No results found for this basename: CKTOTAL:4,CKMB:4,TROPONINI:4 in the last 72 hours No components found with this basename: POCBNP:3 No results found for this basename: DDIMER in the last 72 hours No results found for this basename: HGBA1C in the last 72 hours No results found for this basename: CHOL,HDL,LDLCALC,TRIG,CHOLHDL in the last 72 hours No results found for this basename:  TSH,T4TOTAL,FREET3,T3FREE,THYROIDAB in the last 72 hours No results found for this basename: VITAMINB12,FOLATE,FERRITIN,TIBC,IRON,RETICCTPCT in the last 72 hours  Medications: Scheduled    . bisacodyl  10 mg Oral Daily   Or  . bisacodyl  10 mg Rectal Daily  . docusate sodium  200 mg Oral Daily  . enoxaparin  30 mg Subcutaneous Daily  . imipramine  25 mg Oral QHS  . metoprolol tartrate  37.5 mg Oral BID  . pantoprazole  40 mg Oral QAC breakfast  . polyethylene glycol  17 g Oral Daily  . simvastatin  40 mg Oral q1800  . sodium chloride  3 mL Intravenous Q12H  . Somatropin  0.5333 mg Subcutaneous QPM  . tolterodine  4 mg Oral Daily  . warfarin  5 mg Oral q1800  . DISCONTD: warfarin  2.5 mg Oral q1800     Radiology/Studies:  No results found.  INR:1.42 Will add last result for INR, ABG once components are confirmed Will add last 4 CBG results once components are confirmed  Assessment/Plan: S/P Procedure(s) (LRB): MINIMALLY INVASIVE MITRAL VALVE REPAIR (MVR) (Right)  1 feels ok,cont pulm toilet and wean O2 2. Cont AC rx , now on 5 mg coumadin  LOS: 7 days  Michaela [name] E 2/12/20137:47 AM    I have seen and examined the patient and agree with the assessment and plan as outlined.  Still dyspneic and weight up several kg from baseline.  Increase diuretics.  [name],[name] 10/22/2011 7:21 PM

## 2011-10-22 NOTE — Progress Notes (Signed)
CSW will continue to follow to facilitate d/c to SNF when pt is medically ready.  Baxter Flattery, MSW [phone]

## 2011-10-22 NOTE — Progress Notes (Signed)
Occupational Therapy Treatment Patient Details Name: ORLEAN HOLTROP MRN: [id number] DOB: 02-03-1956 Today's Date: 10/22/2011  Pt. Participated with OT performing LB ADLs and toilet transfer.  Pt. Limited by fatigue and dyspnea 3/4 with LB ADLs.  Pt. Requires significantly increased time to perform tasks due to severity of ataxia.  Pt. Was instructed in incentive spirometer use and in deep breathing techniques.  OT Assessment/Plan  Recommend SNF for Rehab to allow pt. To return home modified independently.  Continue OT at least 1x/wk.     OT Goals  Pt. Progressing toward goals  OT Treatment Precautions/Restrictions      ADL   Mobility    Exercises    End of Session    Jeani Hawking M  10/22/2011, 10:23 AM

## 2011-10-22 NOTE — Progress Notes (Signed)
Physical Therapy Treatment Patient Details Name: Michaela [name] MRN: [id number] DOB: Sep 04, 1956 Today's Date: 10/22/2011  PT Assessment/Plan  PT - Assessment/Plan Comments on Treatment Session: Motivated to ambulate today. DOE during gait/activity. Encouraged use of incentive spirometer and flutter valve.  PT Plan: Discharge plan remains appropriate;Frequency remains appropriate Follow Up Recommendations: Skilled nursing facility Equipment Recommended: Defer to next venue PT Goals  Acute Rehab PT Goals PT Goal: Supine/Side to Sit - Progress: Met Pt will go Sit to Stand: with supervision PT Goal: Sit to Stand - Progress: Updated due to goal met Pt will go Stand to Sit: with supervision PT Goal: Stand to Sit - Progress: Updated due to goals met PT Goal: Ambulate - Progress: Progressing toward goal  PT Treatment Precautions/Restrictions  Precautions Precautions: Fall Precaution Comments: multiple lines and bil chest tubes Required Braces or Orthoses: No Restrictions Weight Bearing Restrictions: No Mobility (including Balance) Bed Mobility Supine to Sit: 6: Modified independent (Device/Increase time) Sitting - Scoot to Edge of Bed: 4: Min assist Sitting - Scoot to [place] of Bed Details (indicate cue type and reason): facilitation for hip/trunk flexion Transfers Sit to Stand: 4: Min assist;From bed (with bed rail on left) Sit to Stand Details (indicate cue type and reason): facilitation to steady pt on feet; pt able to hold onto RW Stand to Sit: 4: Min assist;To chair/3-in-1;With armrests Stand to Sit Details: cues for safe and controlled descent to chair; use of pad to assist in scooting pt posterior in the chair for better positioning  Ambulation/Gait Ambulation/Gait: Yes Ambulation/Gait Assistance: 3: Mod assist Ambulation/Gait Assistance Details (indicate cue type and reason): amb. approx 4 ft with faciliation for weight shift and improved control as pt advances lower  extremities as well as safe technique/positioning with RW; pt with increased DOE with gait Ambulation Distance (Feet): 4 Feet Assistive device: Rolling walker Gait Pattern: Trunk flexed;Ataxic  Static Sitting Balance Static Sitting - Balance Support: Bilateral upper extremity supported;Feet unsupported Static Sitting - Level of Assistance: 5: Stand by assistance Exercise    End of Session PT - End of Session Equipment Utilized During Treatment: Gait belt Activity Tolerance: Patient limited by fatigue;Patient tolerated treatment well Patient left: in chair;with call bell in reach Nurse Communication: Mobility status for transfers;Mobility status for ambulation General Behavior During Session: [facility] for tasks performed Cognition: [facility] for tasks performed  [facility] HELEN 10/22/2011, 2:54 PM

## 2011-10-23 LAB — PROTIME-INR: INR: 1.74 — ABNORMAL HIGH (ref 0.00–1.49)

## 2011-10-23 NOTE — Progress Notes (Addendum)
[address]            [place] [address]          [phone]     8 Days Post-Op  Procedure(s) (LRB): MINIMALLY INVASIVE MITRAL VALVE REPAIR (MVR) (Right) Subjective: Breathing feels about the same, although it did improve temporarily last night  Objective  Telemetry SR with occ pvc's  Temp:  [98.6 F (37 C)-98.9 F (37.2 C)] 98.6 F (37 C) (02/13 0643) Pulse Rate:  [87-95] 87  (02/13 0643) Resp:  [16-18] 16  (02/13 0643) BP: (78-103)/(40-63) 95/63 mmHg (02/13 0643) SpO2:  [96 %-99 %] 96 % (02/13 0643) Weight:  [88 lb 14.4 oz (40.325 kg)-90 lb 4.8 oz (40.96 kg)] 88 lb 14.4 oz (40.325 kg) (02/13 9147)   Intake/Output Summary (Last 24 hours) at 10/23/11 0736 Last data filed at 10/23/11 0703  Gross per 24 hour  Intake    483 ml  Output    650 ml  Net   -167 ml       General appearance: alert and no distress Heart: regular rate and rhythm and S1, S2 normal Lungs: coarse BS's Abdomen: benign exam Extremities: no LE edema Wound: incisions healing well  Lab Results:  Basename 10/21/11 0620  NA 140  K 4.6  CL 105  CO2 29  GLUCOSE 102*  BUN 21  CREATININE 0.59  CALCIUM 8.8  MG --  PHOS --   No results found for this basename: AST:2,ALT:2,ALKPHOS:2,BILITOT:2,PROT:2,ALBUMIN:2 in the last 72 hours No results found for this basename: LIPASE:2,AMYLASE:2 in the last 72 hours  Basename 10/21/11 0620  WBC 9.0  NEUTROABS --  HGB 8.0*  HCT 25.0*  MCV 91.9  PLT 100*   No results found for this basename: CKTOTAL:4,CKMB:4,TROPONINI:4 in the last 72 hours No components found with this basename: POCBNP:3 No results found for this basename: DDIMER in the last 72 hours No results found for this basename: HGBA1C in the last 72 hours No results found for this basename: CHOL,HDL,LDLCALC,TRIG,CHOLHDL in the last 72 hours No results found for this basename: TSH,T4TOTAL,FREET3,T3FREE,THYROIDAB in the last 72 hours No results found for  this basename: VITAMINB12,FOLATE,FERRITIN,TIBC,IRON,RETICCTPCT in the last 72 hours  Medications: Scheduled    . bisacodyl  10 mg Oral Daily   Or  . bisacodyl  10 mg Rectal Daily  . docusate sodium  200 mg Oral Daily  . enoxaparin  30 mg Subcutaneous Daily  . furosemide  20 mg Intravenous Q12H  . furosemide  20 mg Oral Daily  . imipramine  25 mg Oral QHS  . metoprolol tartrate  37.5 mg Oral BID  . pantoprazole  40 mg Oral QAC breakfast  . polyethylene glycol  17 g Oral Daily  . simvastatin  40 mg Oral q1800  . sodium chloride  3 mL Intravenous Q12H  . Somatropin  0.5333 mg Subcutaneous QPM  . tolterodine  4 mg Oral Daily  . warfarin  5 mg Oral q1800     Radiology/Studies:  Dg Chest 2 View  10/22/2011  *RADIOLOGY REPORT*  Clinical Data: Postoperative radiograph.  CABG.  CHEST - 2 VIEW  Comparison: 10/19/2011.  Findings: Mitral annuloplasty.  Discoid atelectasis in the right midlung.  The right chest wall deformity again noted.  Small bilateral pleural effusions layer posteriorly.  Mitral annuloplasty ring.  Small radiopaque density is present over the right upper quadrant, external to the  patient and compatible with shunt tubing connector.  Left upper lobe atelectasis and pleural reaction noted.  No pneumothorax is present.  IMPRESSION: 1.  Postoperative changes of mitral annuloplasty. 2.  Small bilateral pleural effusions, slightly larger than on 10/19/2011. 3.  Right midlung discoid atelectasis and unchanged left upper lobe opacity.  Original Report Authenticated By: Andreas Newport, M.D.    INR:1.74 Will add last result for INR, ABG once components are confirmed Will add last 4 CBG results once components are confirmed  Assessment/Plan: S/P Procedure(s) (LRB): MINIMALLY INVASIVE MITRAL VALVE REPAIR (MVR) (Right) 1. Presents a nursing issue with frequent urination with increased diuretic(needs help every 15-20 minutes) , written for po and IV, will d/c po . Could consider foley  but not ideal. 2. Push pulm toilet, rehab as able 3. Cont ac rx 4. Recheck labs in am  LOS: 8 days    GOLD,WAYNE E 2/13/20137:36 AM    I have seen and examined the patient and agree with the assessment and plan as outlined.  Ms Calbert looks good although HR up a little with diuresis.  Will try to avoid replacing foley if possible.  Emalie Mcwethy H 10/23/2011 7:50 PM

## 2011-10-24 ENCOUNTER — Inpatient Hospital Stay (HOSPITAL_COMMUNITY): Payer: Medicare Other

## 2011-10-24 LAB — URINALYSIS, ROUTINE W REFLEX MICROSCOPIC
Ketones, ur: NEGATIVE mg/dL
Nitrite: NEGATIVE
Protein, ur: NEGATIVE mg/dL
Urobilinogen, UA: 2 mg/dL — ABNORMAL HIGH (ref 0.0–1.0)

## 2011-10-24 LAB — URINE MICROSCOPIC-ADD ON

## 2011-10-24 LAB — BASIC METABOLIC PANEL
Calcium: 8.7 mg/dL (ref 8.4–10.5)
Creatinine, Ser: 0.63 mg/dL (ref 0.50–1.10)
GFR calc Af Amer: >90 mL/min (ref >90)

## 2011-10-24 MED ORDER — METOPROLOL TARTRATE 50 MG PO TABS: 50.0000 mg | ORAL_TABLET | Freq: Two times a day (BID) | ORAL | Status: DC

## 2011-10-24 MED ORDER — FUROSEMIDE 20 MG PO TABS
20.0000 mg | ORAL_TABLET | Freq: Every day | ORAL | Status: DC
  Administered 2011-10-24 – 2011-10-28 (×5): 20 mg via ORAL
  Filled 2011-10-24 (×5): qty 1

## 2011-10-24 MED ORDER — SODIUM CHLORIDE 0.9 % IV BOLUS (SEPSIS)
250.0000 mL | Freq: Once | INTRAVENOUS | Status: AC
  Administered 2011-10-24: 250 mL via INTRAVENOUS

## 2011-10-24 MED ORDER — WARFARIN SODIUM 5 MG PO TABS: 5.0000 mg | ORAL_TABLET | Freq: Every day | ORAL | Status: DC

## 2011-10-24 MED ORDER — METOPROLOL TARTRATE 50 MG PO TABS
50.0000 mg | ORAL_TABLET | Freq: Two times a day (BID) | ORAL | Status: DC
  Administered 2011-10-24 – 2011-10-26 (×6): 50 mg via ORAL
  Filled 2011-10-24 (×8): qty 1

## 2011-10-24 NOTE — Progress Notes (Signed)
Occupational Therapy Treatment Patient Details Name: Michaela [name] MRN: [id number] DOB: Apr 17, 1956 Today's Date: 10/24/2011  OT Assessment/Plan OT Assessment/Plan Comments on Treatment Session: activity tolerance improved this session but continues to be limited secondary to dyspnea OT Plan: Discharge plan remains appropriate Follow Up Recommendations: Skilled nursing facility Equipment Recommended: Defer to next venue OT Goals ADL Goals ADL Goal: Lower Body Dressing - Progress: Progressing toward goals ADL Goal: Toilet Transfer - Progress: Progressing toward goals ADL Goal: Toileting - Hygiene - Progress: Progressing toward goals  OT Treatment Precautions/Restrictions  Precautions Precautions: Fall Restrictions Weight Bearing Restrictions: No   ADL ADL Lower Body Dressing: Performed;Minimal assistance Lower Body Dressing Details (indicate cue type and reason): donned/doffed shoes. required increased time- especially for tying shoes. mentioned benefit of elastic shoelaces.  Where Assessed - Lower Body Dressing: Unsupported;Sitting, chair Toilet Transfer: Performed;Moderate assistance Toilet Transfer Details (indicate cue type and reason): VC for hand placement and sequencing of body movements (pt attempting to standing before scooting to edge of chair). Attempted to use RW for stand-pivot transfer, but believe pt safer without use of RW Toilet Transfer Method: Stand pivot Acupuncturist: Bedside commode Toileting - Clothing Manipulation: Performed;Moderate assistance Where Assessed - Toileting Clothing Manipulation: Standing Toileting - Hygiene: Performed;Set up Where Assessed - Toileting Hygiene: Sit on 3-in-1 or toilet Tub/Shower Transfer: Not assessed Ambulation Related to ADLs: ~18ft with RW. Pt's tremors and ataxia serve as a huge safety risk. Do not recommend pt ambulate without therapy present. Pt with 3/4 dyspnea during ambulation Mobility   Transfers Sit to Stand: 4: Min assist;From chair/3-in-1 Sit to Stand Details (indicate cue type and reason): cueing for hand placement, safety and assist for anterior weight shift Stand to Sit: 4: Min assist;To chair/3-in-1 Stand to Sit Details: assist for hand placement and to control descent  End of Session General Behavior During Session: [facility] for tasks performed Cognition: [facility] for tasks performed  Michaela [name]  10/24/2011, 2:25 PM

## 2011-10-24 NOTE — Progress Notes (Addendum)
[address]            [place] [address]          [phone]     9 Days Post-Op  Procedure(s) (LRB): MINIMALLY INVASIVE MITRAL VALVE REPAIR (MVR) (Right) Subjective: Feels "tired"  Objective  Telemetry Stachy  Temp:  [98.6 F (37 C)-100.2 F (37.9 C)] 100.2 F (37.9 C) (02/14 0500) Pulse Rate:  [89-120] 120  (02/14 0500) Resp:  [18-20] 20  (02/14 0500) BP: (78-104)/(43-64) 104/61 mmHg (02/14 0500) SpO2:  [91 %-97 %] 93 % (02/14 0500)   Intake/Output Summary (Last 24 hours) at 10/24/11 0752 Last data filed at 10/24/11 0400  Gross per 24 hour  Intake    243 ml  Output   2275 ml  Net  -2032 ml       General appearance: alert and no distress Heart: regular rate and rhythm, S1, S2 normal and tachy Lungs: mildly diminished in bases Abdomen: benign exam Extremities: no edema Wound: incisions healing well  Lab Results:  Basename 10/24/11 0545  NA 135  K 4.1  CL 99  CO2 28  GLUCOSE 117*  BUN 28*  CREATININE 0.63  CALCIUM 8.7  MG --  PHOS --   No results found for this basename: AST:2,ALT:2,ALKPHOS:2,BILITOT:2,PROT:2,ALBUMIN:2 in the last 72 hours No results found for this basename: LIPASE:2,AMYLASE:2 in the last 72 hours No results found for this basename: WBC:2,NEUTROABS:2,HGB:2,HCT:2,MCV:2,PLT:2 in the last 72 hours No results found for this basename: CKTOTAL:4,CKMB:4,TROPONINI:4 in the last 72 hours No components found with this basename: POCBNP:3 No results found for this basename: DDIMER in the last 72 hours No results found for this basename: HGBA1C in the last 72 hours No results found for this basename: CHOL,HDL,LDLCALC,TRIG,CHOLHDL in the last 72 hours No results found for this basename: TSH,T4TOTAL,FREET3,T3FREE,THYROIDAB in the last 72 hours No results found for this basename: VITAMINB12,FOLATE,FERRITIN,TIBC,IRON,RETICCTPCT in the last 72 hours  Medications: Scheduled    . bisacodyl  10 mg Oral Daily   Or    . bisacodyl  10 mg Rectal Daily  . docusate sodium  200 mg Oral Daily  . furosemide  20 mg Intravenous Q12H  . imipramine  25 mg Oral QHS  . metoprolol tartrate  37.5 mg Oral BID  . pantoprazole  40 mg Oral QAC breakfast  . polyethylene glycol  17 g Oral Daily  . simvastatin  40 mg Oral q1800  . sodium chloride  3 mL Intravenous Q12H  . Somatropin  0.5333 mg Subcutaneous QPM  . tolterodine  4 mg Oral Daily  . warfarin  5 mg Oral q1800  . DISCONTD: enoxaparin  30 mg Subcutaneous Daily     Radiology/Studies:  No results found.  INR:1.70 Will add last result for INR, ABG once components are confirmed Will add last 4 CBG results once components are confirmed  Assessment/Plan: S/P Procedure(s) (LRB): MINIMALLY INVASIVE MITRAL VALVE REPAIR (MVR) (Right)  1. Cont ac rx 2. Volume status is improved, labs stable, change to po q day 3. Increase beta blocker  4 poss tx to snf soon  LOS: 9 days    GOLD,WAYNE E 2/14/20137:52 AM    I have seen and examined the patient and agree with the assessment and plan as outlined.  Ms Ivins looks quite good and CXR clearing up nicely.  She has had a low grade fever overnight.  Will recheck WBC  in am and check urine for UA, culture.  If no fever or WBC she could be ready for d/c to [facility] tomorrow.   Deonna Krummel H 10/24/2011 9:39 AM

## 2011-10-24 NOTE — Progress Notes (Signed)
CSW note possible d/c tomorrow (2/15). Contacted [facility] to advise; facility has 1 female bed available and will not hold bed if d/c is not definitive. CSW will follow acutely and contact facility as soon as d/c plans are certain.  CSW will speak again with pt and pt sister to update.  Baxter Flattery, MSW [phone]

## 2011-10-24 NOTE — Progress Notes (Signed)
I agree with the above discharge summary and plan for follow-up.  Eyan Hagood H  

## 2011-10-25 LAB — CBC
MCH: 29.6 pg (ref 26.0–34.0)
Platelets: 83 10*3/uL — ABNORMAL LOW (ref 150–400)
RBC: 2.8 MIL/uL — ABNORMAL LOW (ref 3.87–5.11)
WBC: 16.4 10*3/uL — ABNORMAL HIGH (ref 4.0–10.5)

## 2011-10-25 LAB — EXPECTORATED SPUTUM ASSESSMENT W GRAM STAIN, RFLX TO RESP C: Special Requests: NORMAL

## 2011-10-25 LAB — PROTIME-INR
INR: 2.37 — ABNORMAL HIGH (ref 0.00–1.49)
Prothrombin Time: 26.3 seconds — ABNORMAL HIGH (ref 11.6–15.2)

## 2011-10-25 NOTE — Progress Notes (Signed)
Physical Therapy Treatment Patient Details Name: Michaela [name] MRN: [id number] DOB: Dec 07, 1955 Today's Date: 10/25/2011  PT Assessment/Plan  PT - Assessment/Plan Comments on Treatment Session: Pt s/p MVR with continued improvement and ambulation. Pt encouraged to perform ADLs to best of her mobility as nursing has been bathing and assisted with back and trunk bathing today during tx at EOB and on BSC. Pt very pleasant and nursing aware of activity. PT Plan: Discharge plan remains appropriate;Frequency remains appropriate Follow Up Recommendations: Skilled nursing facility PT Goals  Acute Rehab PT Goals PT Goal: Sit to Supine/Side - Progress: Progressing toward goal PT Goal: Sit to Stand - Progress: Progressing toward goal PT Goal: Stand to Sit - Progress: Progressing toward goal Pt will Ambulate: 16 - 50 feet PT Goal: Ambulate - Progress: Updated due to goal met  PT Treatment Precautions/Restrictions  Precautions Precautions: Fall Precaution Comments: 0 Required Braces or Orthoses: No Restrictions Weight Bearing Restrictions: No Mobility (including Balance) Bed Mobility Supine to Sit: 6: Modified independent (Device/Increase time);With rails;HOB flat Sitting - Scoot to Edge of Bed: 5: Supervision Sitting - Scoot to [place] of Bed Details (indicate cue type and reason): supervision for safety and cues to lean forward Transfers Sit to Stand: 4: Min assist;From bed;From chair/3-in-1 Sit to Stand Details (indicate cue type and reason): 3 trials with cueing for hand placement. Pt with assist at axilla to maintain balance Stand to Sit: 4: Min assist;To chair/3-in-1;To bed Stand to Sit Details: x 3 trials with cueing  Stand Pivot Transfers: 4: Min assist Stand Pivot Transfer Details (indicate cue type and reason): bed to [facility] to bed with min assist to control trunk for balance and cueing for hand placment, pt maintains trunk and hip flexion Ambulation/Gait Ambulation/Gait:  Yes Ambulation/Gait Assistance: 4: Min assist Ambulation/Gait Assistance Details (indicate cue type and reason): cueing and assist to maintain self in RW and to shift direction of RW, ataxic gait and flexed trunk throughout Ambulation Distance (Feet): 17 Feet Assistive device: Rolling walker Gait Pattern: Trunk flexed;Ataxic Stairs: No  Posture/Postural Control Posture/Postural Control: Postural limitations Balance Balance Assessed: Yes Static Sitting Balance Static Sitting - Balance Support: Feet supported;No upper extremity supported Static Sitting - Level of Assistance: 5: Stand by assistance Static Standing Balance Static Standing - Balance Support: Bilateral upper extremity supported Static Standing - Level of Assistance: 4: Min assist Exercise  General Exercises - Lower Extremity Long Arc Quad: AROM;Both;20 reps;Seated Hip Flexion/Marching: AROM;Both;Other reps (comment);Seated (20reps) End of Session PT - End of Session Equipment Utilized During Treatment: Gait belt Activity Tolerance: Patient tolerated treatment well;Patient limited by fatigue Patient left: in chair;with call bell in reach Nurse Communication: Mobility status for transfers;Mobility status for ambulation General Behavior During Session: [facility] for tasks performed Cognition: [facility] for tasks performed  Delorse Lek 10/25/2011, 9:15 AM Toney Sang, PT [phone]

## 2011-10-25 NOTE — Consults (Signed)
   CARDIOTHORACIC SURGERY PROGRESS NOTE  10 Days Post-Op  S/P Procedure(s) (LRB): MINIMALLY INVASIVE MITRAL VALVE REPAIR (MVR) (Right)  Subjective: Looks good and feels well.  Denies cough.  Reports breathing well.  Good appetite.  Objective: Vital signs in last 24 hours: Temp:  [98.1 F (36.7 C)-98.7 F (37.1 C)] 98.1 F (36.7 C) (02/15 0407) Pulse Rate:  [79-100] 79  (02/15 0407) Cardiac Rhythm:  [-] Normal sinus rhythm (02/15 0739) Resp:  [18] 18  (02/15 0407) BP: (71-129)/(41-56) 86/47 mmHg (02/15 0407) SpO2:  [94 %-96 %] 96 % (02/15 0407) Weight:  [38.465 kg (84 lb 12.8 oz)] 38.465 kg (84 lb 12.8 oz) (02/15 0407)  Physical Exam:  Rhythm:   sinus  Breath sounds: clear  Heart sounds:  RRR no murmur  Incisions:  Clean and dry  Abdomen:  Soft, non tender  Extremities:  Warm well perfused   Intake/Output from previous day: 02/14 0701 - 02/15 0700 In: 840 [P.O.:840] Out: 625 [Urine:625] Intake/Output this shift:    Lab Results:  Basename 10/25/11 0500  WBC 16.4*  HGB 8.3*  HCT 25.4*  PLT 83*   BMET:  Basename 10/24/11 0545  NA 135  K 4.1  CL 99  CO2 28  GLUCOSE 117*  BUN 28*  CREATININE 0.63  CALCIUM 8.7    CBG (last 3)  No results found for this basename: GLUCAP:3 in the last 72 hours PT/INR:   Basename 10/25/11 0500  LABPROT 26.3*  INR 2.37*    CXR:  N/A  Urinalysis 2/14:  Results for Michaela Michaela [name], Michaela [name] (MRN [id number]) as of 10/25/2011 08:25  Ref. Range 10/24/2011 21:59  Color, Urine Latest Range: YELLOW  YELLOW  APPearance Latest Range: CLEAR  CLEAR  Specific Gravity, Urine Latest Range: 1.005-1.030  1.017  pH Latest Range: 5.0-8.0  7.5  Glucose, UA Latest Range: NEGATIVE mg/dL NEGATIVE  Bilirubin Urine Latest Range: NEGATIVE  NEGATIVE  Ketones, ur Latest Range: NEGATIVE mg/dL NEGATIVE  Protein Latest Range: NEGATIVE mg/dL NEGATIVE  Urobilinogen, UA Latest Range: 0.0-1.0 mg/dL 2.0 (H)  Nitrite Latest Range: NEGATIVE  NEGATIVE    Leukocytes, UA Latest Range: NEGATIVE  MODERATE (A)  Hgb urine dipstick Latest Range: NEGATIVE  NEGATIVE  WBC, UA Latest Range: <3 WBC/hpf 21-50  RBC / HPF Latest Range: <3 RBC/hpf 0-2  Bacteria, UA Latest Range: RARE  RARE    Assessment/Plan: S/P Procedure(s) (LRB): MINIMALLY INVASIVE MITRAL VALVE REPAIR (MVR) (Right)  Doing well and no further fevers since yesterday, but WBC increased UA negative, urine culture pending   Will hold d/c and check blood and sputum cultures  I still suspect UTI - will follow up urine culture  Recheck WBC and CXR in am  Michaela Michaela [name] H 10/25/2011 8:23 AM

## 2011-10-26 ENCOUNTER — Inpatient Hospital Stay (HOSPITAL_COMMUNITY): Payer: Medicare Other

## 2011-10-26 LAB — URINE CULTURE: Colony Count: 60000

## 2011-10-26 LAB — BASIC METABOLIC PANEL
BUN: 28 mg/dL — ABNORMAL HIGH (ref 6–23)
Chloride: 101 mEq/L (ref 96–112)
GFR calc non Af Amer: >90 mL/min (ref >90)
Glucose, Bld: 123 mg/dL — ABNORMAL HIGH (ref 70–99)
Potassium: 4.1 mEq/L (ref 3.5–5.1)

## 2011-10-26 LAB — CBC
MCHC: 32.5 g/dL (ref 30.0–36.0)
Platelets: 90 10*3/uL — ABNORMAL LOW (ref 150–400)
RDW: 15.7 % — ABNORMAL HIGH (ref 11.5–15.5)

## 2011-10-26 LAB — PROTIME-INR
INR: 2.5 — ABNORMAL HIGH (ref 0.00–1.49)
Prothrombin Time: 27.4 seconds — ABNORMAL HIGH (ref 11.6–15.2)

## 2011-10-26 MED ORDER — CIPROFLOXACIN HCL 250 MG PO TABS
250.0000 mg | ORAL_TABLET | Freq: Two times a day (BID) | ORAL | Status: DC
  Administered 2011-10-26 – 2011-10-29 (×7): 250 mg via ORAL
  Filled 2011-10-26 (×9): qty 1

## 2011-10-26 MED ORDER — WARFARIN SODIUM 2.5 MG PO TABS
2.5000 mg | ORAL_TABLET | Freq: Every day | ORAL | Status: DC
  Administered 2011-10-26 – 2011-10-28 (×3): 2.5 mg via ORAL
  Filled 2011-10-26 (×4): qty 1

## 2011-10-26 NOTE — Progress Notes (Addendum)
                    [address]            [place] [address]          [phone]     11 Days Post-Op Procedure(s) (LRB): MINIMALLY INVASIVE MITRAL VALVE REPAIR (MVR) (Right)  Subjective: Still feels a bit weak, but no specific complaints.  Slight cough with brownish sputum.  No dysuria or pain.  Objective: Vital signs in last 24 hours: Patient Vitals for the past 24 hrs:  BP Temp Temp src Pulse Resp SpO2 Weight  10/26/11 0434 99/62 mmHg 98.2 F (36.8 C) Oral 104  18  100 % 38.873 kg (85 lb 11.2 oz)  10/25/11 2110 90/52 mmHg - - - - - -  10/25/11 1946 81/46 mmHg 100.1 F (37.8 C) Oral 103  18  98 % -  10/25/11 1406 73/44 mmHg 98.6 F (37 C) Oral 85  18  97 % -   Current Weight  10/26/11 38.873 kg (85 lb 11.2 oz)     Intake/Output from previous day: 02/15 0701 - 02/16 0700 In: 840 [P.O.:840] Out: 450 [Urine:450]    PHYSICAL EXAM:  Heart: RRR Lungs: clear Wound: chest and groin wounds stable, no erythema or drainage.  Old IV sites examined, no erythema Extremities: no edema  Lab Results: CBC: Basename 10/26/11 0530 10/25/11 0500  WBC 17.2* 16.4*  HGB 8.1* 8.3*  HCT 24.9* 25.4*  PLT 90* 83*   BMET:  Basename 10/26/11 0038 10/24/11 0545  NA 133* 135  K 4.1 4.1  CL 101 99  CO2 27 28  GLUCOSE 123* 117*  BUN 28* 28*  CREATININE 0.70 0.63  CALCIUM 8.4 8.7    PT/INR:  Basename 10/26/11 0530  LABPROT 27.4*  INR 2.50*  Sputum cx negative CXR: stable small bilateral effusions and ASD U/A:  Moderate leukocytes, rare bacteria Urine cx- 60K colonies gr - rods  Assessment/Plan: S/P Procedure(s) (LRB): MINIMALLY INVASIVE MITRAL VALVE REPAIR (MVR) (Right) ID- LGF overnight, persistent, slightly worsened leukocytosis.  She reports being treated by her gyn pre-op for a UTI, but she denies any current symptoms.  Urine cx as above.  In light of recent valve surgery, should probably treat with empiric abx, since no other obvious source for  infection. Continue current care.   LOS: 11 days    COLLINS,GINA H 10/26/2011   I have seen and examined the patient and agree with the assessment and plan as outlined.  I agree with starting Cipro empirically.  Will decrease coumadin dose accordingly and watch INR.  Purcell Nails 10/26/2011 12:33 PM

## 2011-10-27 LAB — CBC
HCT: 24.3 % — ABNORMAL LOW (ref 36.0–46.0)
MCH: 29.4 pg (ref 26.0–34.0)
MCV: 91.7 fL (ref 78.0–100.0)
RDW: 15.8 % — ABNORMAL HIGH (ref 11.5–15.5)
WBC: 14.3 10*3/uL — ABNORMAL HIGH (ref 4.0–10.5)

## 2011-10-27 MED ORDER — FOLIC ACID 1 MG PO TABS
1.0000 mg | ORAL_TABLET | Freq: Every day | ORAL | Status: DC
  Administered 2011-10-27 – 2011-10-29 (×3): 1 mg via ORAL
  Filled 2011-10-27 (×3): qty 1

## 2011-10-27 MED ORDER — POLYSACCHARIDE IRON 150 MG PO CAPS: 150.0000 mg | ORAL_CAPSULE | Freq: Every day | ORAL | Status: DC

## 2011-10-27 MED ORDER — METOPROLOL TARTRATE 25 MG PO TABS: 25.0000 mg | ORAL_TABLET | Freq: Two times a day (BID) | ORAL | Status: DC

## 2011-10-27 MED ORDER — METOPROLOL TARTRATE 25 MG PO TABS
25.0000 mg | ORAL_TABLET | Freq: Two times a day (BID) | ORAL | Status: DC
  Administered 2011-10-27 – 2011-10-29 (×4): 25 mg via ORAL
  Filled 2011-10-27 (×6): qty 1

## 2011-10-27 MED ORDER — CIPROFLOXACIN HCL 250 MG PO TABS: 250.0000 mg | ORAL_TABLET | Freq: Two times a day (BID) | ORAL | Status: AC

## 2011-10-27 MED ORDER — POLYSACCHARIDE IRON 150 MG PO CAPS
150.0000 mg | ORAL_CAPSULE | Freq: Every day | ORAL | Status: DC
  Administered 2011-10-27 – 2011-10-29 (×3): 150 mg via ORAL
  Filled 2011-10-27 (×3): qty 1

## 2011-10-27 MED ORDER — FOLIC ACID 1 MG PO TABS: 1.0000 mg | ORAL_TABLET | Freq: Every day | ORAL | Status: DC

## 2011-10-27 MED ORDER — WARFARIN SODIUM 5 MG PO TABS: 2.5000 mg | ORAL_TABLET | Freq: Every day | ORAL | Status: DC

## 2011-10-27 NOTE — Progress Notes (Addendum)
                    [address]            [place] [address]          [phone]     12 Days Post-Op Procedure(s) (LRB): MINIMALLY INVASIVE MITRAL VALVE REPAIR (MVR) (Right)  Subjective: Tm 99.1.  Pt without complaints this am.  Objective: Vital signs in last 24 hours: Patient Vitals for the past 24 hrs:  BP Temp Temp src Pulse Resp SpO2 Weight  10/27/11 0709 - - - - - 98 % -  10/27/11 0525 83/43 mmHg 99.1 F (37.3 C) Oral 100  18  - -  10/27/11 0500 - - - - - - 40.415 kg (89 lb 1.6 oz)  10/26/11 2124 90/51 mmHg 98.4 F (36.9 C) Oral 94  18  93 % -  10/26/11 1744 88/48 mmHg - - - - - -  10/26/11 1537 79/41 mmHg 98.5 F (36.9 C) Oral 86  18  95 % -  10/26/11 1101 87/55 mmHg - - 102  - - -   Current Weight  10/27/11 40.415 kg (89 lb 1.6 oz)     Intake/Output from previous day: 02/16 0701 - 02/17 0700 In: 720 [P.O.:720] Out: 315 [Urine:315]    PHYSICAL EXAM:  Heart: RRR Lungs: clear Wound: clean and dry Extremities: no edema  Lab Results: CBC: Basename 10/27/11 0546 10/26/11 0530  WBC 14.3* 17.2*  HGB 7.8* 8.1*  HCT 24.3* 24.9*  PLT 110* 90*   BMET:  Basename 10/26/11 0038  NA 133*  K 4.1  CL 101  CO2 27  GLUCOSE 123*  BUN 28*  CREATININE 0.70  CALCIUM 8.4    PT/INR:  Basename 10/27/11 0546  LABPROT 24.5*  INR 2.16*   Urine cx- Pseudomonas aeruginosa  Assessment/Plan: S/P Procedure(s) (LRB): MINIMALLY INVASIVE MITRAL VALVE REPAIR (MVR) (Right) CV- BPs low over past day or so, 70-90 systolic.  Will decrease Lopressor dose and watch.  Maintaining SR. Pseudomonas UTI- Cipro D#2.  WBC trending down. Continue Coumadin at lower dose while on abx. Possibly home soon if she continues to progress.   LOS: 12 days    COLLINS,GINA H 10/27/2011   I have seen and examined the patient and agree with the assessment and plan as outlined.  Possible d/c tomorrow to [facility] if bed available.  Elzie Sheets H 10/27/2011 1:27  PM

## 2011-10-28 ENCOUNTER — Ambulatory Visit: Payer: Self-pay | Admitting: Thoracic Surgery (Cardiothoracic Vascular Surgery)

## 2011-10-28 ENCOUNTER — Inpatient Hospital Stay (HOSPITAL_COMMUNITY): Payer: Medicare Other

## 2011-10-28 LAB — CBC
HCT: 25.5 % — ABNORMAL LOW (ref 36.0–46.0)
MCH: 29.2 pg (ref 26.0–34.0)
MCV: 92.1 fL (ref 78.0–100.0)
RDW: 15.8 % — ABNORMAL HIGH (ref 11.5–15.5)
WBC: 14.5 10*3/uL — ABNORMAL HIGH (ref 4.0–10.5)

## 2011-10-28 LAB — CULTURE, RESPIRATORY W GRAM STAIN: Culture: NORMAL

## 2011-10-28 MED ORDER — FUROSEMIDE 10 MG/ML IJ SOLN
40.0000 mg | Freq: Once | INTRAMUSCULAR | Status: AC
  Administered 2011-10-28: 40 mg via INTRAVENOUS
  Filled 2011-10-28: qty 4

## 2011-10-28 MED ORDER — FUROSEMIDE 40 MG PO TABS
40.0000 mg | ORAL_TABLET | Freq: Every day | ORAL | Status: DC
  Filled 2011-10-28: qty 1

## 2011-10-28 NOTE — Progress Notes (Signed)
TCTS BRIEF PROGRESS NOTE   CXR reveals increased left lower lobe atelectasis and small pleural effusion with mild pulm vasc congestion.  Pro BNP level elevated.  Will give extra dose lasix IV, transfuse 1 unit PRBC's for symptomatic anemia and increase daily lasix dose.  Will hold d/c at least until tomorrow am.  Purcell Nails 10/28/2011 3:30 PM

## 2011-10-28 NOTE — Progress Notes (Addendum)
[name]            [place] [address]          [phone]     13 Days Post-Op  Procedure(s) (LRB): MINIMALLY INVASIVE MITRAL VALVE REPAIR (MVR) (Right) Subjective: Low grade temp , feels SOB at times, sats OK on 2 liters  Objective  Telemetry NSR/s tachy  Temp:  [97.9 F (36.6 C)-99.7 F (37.6 C)] 98.9 F (37.2 C) (02/18 0327) Pulse Rate:  [84-108] 87  (02/18 0327) Resp:  [16-18] 16  (02/18 0327) BP: (82-105)/(41-62) 92/48 mmHg (02/18 0327) SpO2:  [95 %-99 %] 99 % (02/18 0327) Weight:  [88 lb 3.2 oz (40.007 kg)] 88 lb 3.2 oz (40.007 kg) (02/18 0500)   Intake/Output Summary (Last 24 hours) at 10/28/11 0733 Last data filed at 10/28/11 0600  Gross per 24 hour  Intake    720 ml  Output    550 ml  Net    170 ml       General appearance: alert, cooperative, fatigued and no distress Heart: regular rate and rhythm and S1, S2 normal Lungs: mildly diminished in bases Abdomen: soft, mild distension, non tender Extremities: no edema Wound: healing well  Lab Results:  Basename 10/26/11 0038  NA 133*  K 4.1  CL 101  CO2 27  GLUCOSE 123*  BUN 28*  CREATININE 0.70  CALCIUM 8.4  MG --  PHOS --   No results found for this basename: AST:2,ALT:2,ALKPHOS:2,BILITOT:2,PROT:2,ALBUMIN:2 in the last 72 hours No results found for this basename: LIPASE:2,AMYLASE:2 in the last 72 hours  Basename 10/28/11 0622 10/27/11 0546  WBC 14.5* 14.3*  NEUTROABS -- --  HGB 8.1* 7.8*  HCT 25.5* 24.3*  MCV 92.1 91.7  PLT 133* 110*   No results found for this basename: CKTOTAL:4,CKMB:4,TROPONINI:4 in the last 72 hours No components found with this basename: POCBNP:3 No results found for this basename: DDIMER in the last 72 hours No results found for this basename: HGBA1C in the last 72 hours No results found for this basename: CHOL,HDL,LDLCALC,TRIG,CHOLHDL in the last 72 hours No results found for this basename: TSH,T4TOTAL,FREET3,T3FREE,THYROIDAB  in the last 72 hours No results found for this basename: VITAMINB12,FOLATE,FERRITIN,TIBC,IRON,RETICCTPCT in the last 72 hours  Medications: Scheduled    . bisacodyl  10 mg Oral Daily   Or  . bisacodyl  10 mg Rectal Daily  . ciprofloxacin  250 mg Oral BID  . docusate sodium  200 mg Oral Daily  . folic acid  1 mg Oral Daily  . furosemide  20 mg Oral Daily  . imipramine  25 mg Oral QHS  . metoprolol tartrate  25 mg Oral BID  . pantoprazole  40 mg Oral QAC breakfast  . polyethylene glycol  17 g Oral Daily  . polysaccharide iron  150 mg Oral Daily  . simvastatin  40 mg Oral q1800  . sodium chloride  3 mL Intravenous Q12H  . Somatropin  0.5333 mg Subcutaneous QPM  . tolterodine  4 mg Oral Daily  . warfarin  2.5 mg Oral q1800  . DISCONTD: metoprolol tartrate  50 mg Oral BID     Radiology/Studies:  No results found.  INR:1.93 Will add last result for INR, ABG once components are confirmed Will add last 4 CBG results once components are confirmed  Assessment/Plan: S/P Procedure(s) (LRB): MINIMALLY INVASIVE MITRAL VALVE REPAIR (MVR) (Right)  1.  She appears stable, still with low grade fever, using O2 at times, will recheck CXR.  2. Cont Cipro for low grade UTI 3. Cont AC rx  LOS: 13 days    Michaela [name],Michaela [name] 2/18/20137:33 AM    I have seen and examined the patient and agree with the assessment and plan as outlined.  Will check CXR but not portable film. Will also check pro BNP.  Michaela [name] has a tendency to get short of breath at nights when she's trying to sleep.  This may be primarily due to atelectasis as she rapidly recovers with deep inspiration.  However, CHF could be playing a role.  Michaela [name] 10/28/2011 8:53 AM

## 2011-10-28 NOTE — Progress Notes (Signed)
Physical Therapy Treatment Patient Details Name: Michaela [name] MRN: [id number] DOB: [birth year] Today's Date: 10/28/2011  PT Assessment/Plan  PT - Assessment/Plan Comments on Treatment Session: Pt doing well today. Focused session on exercises to improve lower extremity strengthening as well as trunk/lower extremity graded muscular control in closed chain activities.  PT Plan: Discharge plan remains appropriate;Frequency remains appropriate Follow Up Recommendations: Skilled nursing facility Equipment Recommended: Defer to next venue PT Goals  Acute Rehab PT Goals PT Goal: Sit to Stand - Progress: Progressing toward goal PT Goal: Stand to Sit - Progress: Progressing toward goal PT Goal: Ambulate - Progress: Progressing toward goal  PT Treatment Precautions/Restrictions  Precautions Precautions: Fall Precaution Comments: 0 Required Braces or Orthoses: No Restrictions Weight Bearing Restrictions: No Mobility (including Balance) Bed Mobility Bed Mobility: No Transfers Sit to Stand: 4: Min assist;3: Mod assist Sit to Stand Details (indicate cue type and reason): sit<->stand x5 with hands supported on therapists shoulders for closed chain exercise to improve trunk/hip control; pt requring min-modA secondary to fatigue after several attempts with more difficulty on right  Stand to Sit: 4: Min assist;3: Mod assist Stand to Sit Details: see sit->stand section for details concerning multiple transfers Stand Pivot Transfers: 4: Min assist;3: Mod assist Stand Pivot Transfer Details (indicate cue type and reason): recliner->BSC x2 (1st transfer pt performing pivotal steps with RW but very uncontrolled movement requring min-modA for safety to control hips and RW; 2nd transfer pt positioned facing recliner able to reach out for arm rests  and turn in front of recliner with increased control and safety) Ambulation/Gait Ambulation/Gait: No  Static Standing Balance Static Standing - Balance  Support: Bilateral upper extremity supported Static Standing - Level of Assistance: 4: Min assist Dynamic Standing Balance Dynamic Standing - Balance Activities: Lateral lean/weight shifting Dynamic Standing - Comments: with bilateral upper extremities supported pt performed lateral weight shift as well as trunk rotation; faciliation at hips and upper trunk to maintain upright posture (pt with much better trunk control in closed chain activities) Exercise    End of Session PT - End of Session Equipment Utilized During Treatment: Gait belt Activity Tolerance: Patient tolerated treatment well;Patient limited by fatigue Patient left: in chair;with call bell in reach (chair alarm) Nurse Communication: Mobility status for transfers General Behavior During Session: [facility] for tasks performed Cognition: [facility] for tasks performed  [facility] HELEN 10/28/2011, 3:00 PM

## 2011-10-28 NOTE — Progress Notes (Signed)
PT Cancellation Note  Treatment cancelled today due to patient receiving procedure or test. Pt headed to X-ray. Will f/u as time allows.  [facility] HELEN 10/28/2011, 9:25 AM [phone]

## 2011-10-29 DIAGNOSIS — D649 Anemia, unspecified: Secondary | ICD-10-CM | POA: Diagnosis not present

## 2011-10-29 DIAGNOSIS — E43 Unspecified severe protein-calorie malnutrition: Secondary | ICD-10-CM | POA: Diagnosis present

## 2011-10-29 DIAGNOSIS — G8250 Quadriplegia, unspecified: Secondary | ICD-10-CM | POA: Diagnosis not present

## 2011-10-29 DIAGNOSIS — I5032 Chronic diastolic (congestive) heart failure: Secondary | ICD-10-CM | POA: Diagnosis not present

## 2011-10-29 DIAGNOSIS — J9819 Other pulmonary collapse: Secondary | ICD-10-CM | POA: Diagnosis not present

## 2011-10-29 DIAGNOSIS — Z954 Presence of other heart-valve replacement: Secondary | ICD-10-CM | POA: Diagnosis not present

## 2011-10-29 DIAGNOSIS — D62 Acute posthemorrhagic anemia: Secondary | ICD-10-CM | POA: Diagnosis not present

## 2011-10-29 DIAGNOSIS — J690 Pneumonitis due to inhalation of food and vomit: Secondary | ICD-10-CM | POA: Diagnosis present

## 2011-10-29 DIAGNOSIS — I059 Rheumatic mitral valve disease, unspecified: Secondary | ICD-10-CM | POA: Diagnosis present

## 2011-10-29 DIAGNOSIS — M129 Arthropathy, unspecified: Secondary | ICD-10-CM | POA: Diagnosis not present

## 2011-10-29 DIAGNOSIS — Z006 Encounter for examination for normal comparison and control in clinical research program: Secondary | ICD-10-CM | POA: Diagnosis not present

## 2011-10-29 DIAGNOSIS — R000 Tachycardia, unspecified: Secondary | ICD-10-CM | POA: Diagnosis present

## 2011-10-29 DIAGNOSIS — Z7901 Long term (current) use of anticoagulants: Secondary | ICD-10-CM | POA: Diagnosis not present

## 2011-10-29 DIAGNOSIS — Z85841 Personal history of malignant neoplasm of brain: Secondary | ICD-10-CM | POA: Diagnosis not present

## 2011-10-29 DIAGNOSIS — E785 Hyperlipidemia, unspecified: Secondary | ICD-10-CM | POA: Diagnosis not present

## 2011-10-29 DIAGNOSIS — R279 Unspecified lack of coordination: Secondary | ICD-10-CM | POA: Diagnosis present

## 2011-10-29 DIAGNOSIS — I10 Essential (primary) hypertension: Secondary | ICD-10-CM | POA: Diagnosis not present

## 2011-10-29 DIAGNOSIS — Z5189 Encounter for other specified aftercare: Secondary | ICD-10-CM | POA: Diagnosis not present

## 2011-10-29 DIAGNOSIS — I509 Heart failure, unspecified: Secondary | ICD-10-CM | POA: Diagnosis not present

## 2011-10-29 DIAGNOSIS — A0472 Enterocolitis due to Clostridium difficile, not specified as recurrent: Secondary | ICD-10-CM | POA: Diagnosis not present

## 2011-10-29 DIAGNOSIS — R32 Unspecified urinary incontinence: Secondary | ICD-10-CM | POA: Diagnosis not present

## 2011-10-29 DIAGNOSIS — IMO0002 Reserved for concepts with insufficient information to code with codable children: Secondary | ICD-10-CM | POA: Diagnosis not present

## 2011-10-29 DIAGNOSIS — R791 Abnormal coagulation profile: Secondary | ICD-10-CM | POA: Diagnosis present

## 2011-10-29 DIAGNOSIS — N390 Urinary tract infection, site not specified: Secondary | ICD-10-CM | POA: Diagnosis not present

## 2011-10-29 DIAGNOSIS — J9600 Acute respiratory failure, unspecified whether with hypoxia or hypercapnia: Secondary | ICD-10-CM | POA: Diagnosis present

## 2011-10-29 DIAGNOSIS — I959 Hypotension, unspecified: Secondary | ICD-10-CM | POA: Diagnosis not present

## 2011-10-29 DIAGNOSIS — K5289 Other specified noninfective gastroenteritis and colitis: Secondary | ICD-10-CM | POA: Diagnosis present

## 2011-10-29 DIAGNOSIS — B999 Unspecified infectious disease: Secondary | ICD-10-CM | POA: Diagnosis not present

## 2011-10-29 DIAGNOSIS — R143 Flatulence: Secondary | ICD-10-CM | POA: Diagnosis not present

## 2011-10-29 DIAGNOSIS — G9340 Encephalopathy, unspecified: Secondary | ICD-10-CM | POA: Diagnosis present

## 2011-10-29 DIAGNOSIS — A419 Sepsis, unspecified organism: Secondary | ICD-10-CM | POA: Diagnosis not present

## 2011-10-29 DIAGNOSIS — R0602 Shortness of breath: Secondary | ICD-10-CM | POA: Diagnosis not present

## 2011-10-29 DIAGNOSIS — Z87891 Personal history of nicotine dependence: Secondary | ICD-10-CM | POA: Diagnosis not present

## 2011-10-29 DIAGNOSIS — M6281 Muscle weakness (generalized): Secondary | ICD-10-CM | POA: Diagnosis not present

## 2011-10-29 DIAGNOSIS — I4891 Unspecified atrial fibrillation: Secondary | ICD-10-CM | POA: Diagnosis present

## 2011-10-29 DIAGNOSIS — R197 Diarrhea, unspecified: Secondary | ICD-10-CM | POA: Diagnosis not present

## 2011-10-29 DIAGNOSIS — J189 Pneumonia, unspecified organism: Secondary | ICD-10-CM | POA: Diagnosis not present

## 2011-10-29 DIAGNOSIS — E871 Hypo-osmolality and hyponatremia: Secondary | ICD-10-CM | POA: Diagnosis present

## 2011-10-29 DIAGNOSIS — R079 Chest pain, unspecified: Secondary | ICD-10-CM | POA: Diagnosis not present

## 2011-10-29 DIAGNOSIS — C719 Malignant neoplasm of brain, unspecified: Secondary | ICD-10-CM | POA: Diagnosis present

## 2011-10-29 DIAGNOSIS — R6521 Severe sepsis with septic shock: Secondary | ICD-10-CM | POA: Diagnosis present

## 2011-10-29 DIAGNOSIS — J90 Pleural effusion, not elsewhere classified: Secondary | ICD-10-CM | POA: Diagnosis not present

## 2011-10-29 DIAGNOSIS — R109 Unspecified abdominal pain: Secondary | ICD-10-CM | POA: Diagnosis not present

## 2011-10-29 DIAGNOSIS — E559 Vitamin D deficiency, unspecified: Secondary | ICD-10-CM | POA: Diagnosis not present

## 2011-10-29 DIAGNOSIS — R918 Other nonspecific abnormal finding of lung field: Secondary | ICD-10-CM | POA: Diagnosis not present

## 2011-10-29 DIAGNOSIS — D696 Thrombocytopenia, unspecified: Secondary | ICD-10-CM | POA: Diagnosis present

## 2011-10-29 DIAGNOSIS — D509 Iron deficiency anemia, unspecified: Secondary | ICD-10-CM | POA: Diagnosis present

## 2011-10-29 DIAGNOSIS — E7800 Pure hypercholesterolemia, unspecified: Secondary | ICD-10-CM | POA: Diagnosis not present

## 2011-10-29 DIAGNOSIS — Z23 Encounter for immunization: Secondary | ICD-10-CM | POA: Diagnosis not present

## 2011-10-29 DIAGNOSIS — R630 Anorexia: Secondary | ICD-10-CM | POA: Diagnosis not present

## 2011-10-29 LAB — CBC
Hemoglobin: 10.5 g/dL — ABNORMAL LOW (ref 12.0–15.0)
MCH: 29.6 pg (ref 26.0–34.0)
MCV: 90.4 fL (ref 78.0–100.0)
RBC: 3.55 MIL/uL — ABNORMAL LOW (ref 3.87–5.11)

## 2011-10-29 LAB — BASIC METABOLIC PANEL
CO2: 31 mEq/L (ref 19–32)
Glucose, Bld: 115 mg/dL — ABNORMAL HIGH (ref 70–99)
Potassium: 4.4 mEq/L (ref 3.5–5.1)
Sodium: 136 mEq/L (ref 135–145)

## 2011-10-29 LAB — TYPE AND SCREEN
Antibody Screen: NEGATIVE
Unit division: 0

## 2011-10-29 MED ORDER — FUROSEMIDE 40 MG PO TABS: 40.0000 mg | ORAL_TABLET | Freq: Two times a day (BID) | ORAL | Status: DC

## 2011-10-29 MED ORDER — FUROSEMIDE 40 MG PO TABS
40.0000 mg | ORAL_TABLET | Freq: Two times a day (BID) | ORAL | Status: DC
  Filled 2011-10-29 (×2): qty 1

## 2011-10-29 MED ORDER — TRAMADOL HCL 50 MG PO TABS: 50.0000 mg | ORAL_TABLET | Freq: Four times a day (QID) | ORAL | Status: AC | PRN

## 2011-10-29 MED ORDER — FUROSEMIDE 10 MG/ML IJ SOLN
40.0000 mg | Freq: Once | INTRAMUSCULAR | Status: AC
  Administered 2011-10-29: 40 mg via INTRAVENOUS
  Filled 2011-10-29: qty 4

## 2011-10-29 MED ORDER — POTASSIUM CHLORIDE ER 10 MEQ PO TBCR: 20.0000 meq | EXTENDED_RELEASE_TABLET | Freq: Two times a day (BID) | ORAL | Status: DC

## 2011-10-29 NOTE — Progress Notes (Signed)
patietn transported by ems to SNF. Michaela [name], Randall An RN

## 2011-10-29 NOTE — Progress Notes (Addendum)
[name]            [place] [address]          [phone]     14 Days Post-Op  Procedure(s) (LRB): MINIMALLY INVASIVE MITRAL VALVE REPAIR (MVR) (Right) Subjective: Feels ok, not SOB  Objective  Telemetry SR, occas PVC's  Temp:  [97.5 F (36.4 C)-100 F (37.8 C)] 98 F (36.7 C) (02/19 0433) Pulse Rate:  [92-109] 98  (02/19 0433) Resp:  [18] 18  (02/19 0433) BP: (84-112)/(42-57) 96/57 mmHg (02/19 0433) SpO2:  [88 %-100 %] 93 % (02/19 0433) Weight:  [85 lb 9.6 oz (38.828 kg)] 85 lb 9.6 oz (38.828 kg) (02/19 0433)   Intake/Output Summary (Last 24 hours) at 10/29/11 0739 Last data filed at 10/29/11 0432  Gross per 24 hour  Intake  982.5 ml  Output   1550 ml  Net -567.5 ml       General appearance: alert, cooperative and no distress Heart: regular rate and rhythm and S1, S2 normal Lungs: diminished L<R base, mostly clear, no crackles Abdomen: soft, non-tender Extremities: no peripheral edema Wound: incisions healing well  Lab Results: No results found for this basename: NA:2,K:2,CL:2,CO2:2,GLUCOSE:2,BUN:2,CREATININE:2,CALCIUM:2,MG:2,PHOS:2 in the last 72 hours No results found for this basename: AST:2,ALT:2,ALKPHOS:2,BILITOT:2,PROT:2,ALBUMIN:2 in the last 72 hours No results found for this basename: LIPASE:2,AMYLASE:2 in the last 72 hours  Basename 10/28/11 0622 10/27/11 0546  WBC 14.5* 14.3*  NEUTROABS -- --  HGB 8.1* 7.8*  HCT 25.5* 24.3*  MCV 92.1 91.7  PLT 133* 110*   No results found for this basename: CKTOTAL:4,CKMB:4,TROPONINI:4 in the last 72 hours No components found with this basename: POCBNP:3 No results found for this basename: DDIMER in the last 72 hours No results found for this basename: HGBA1C in the last 72 hours No results found for this basename: CHOL,HDL,LDLCALC,TRIG,CHOLHDL in the last 72 hours No results found for this basename: TSH,T4TOTAL,FREET3,T3FREE,THYROIDAB in the last 72 hours No results  found for this basename: VITAMINB12,FOLATE,FERRITIN,TIBC,IRON,RETICCTPCT in the last 72 hours  Medications: Scheduled    . bisacodyl  10 mg Oral Daily   Or  . bisacodyl  10 mg Rectal Daily  . ciprofloxacin  250 mg Oral BID  . docusate sodium  200 mg Oral Daily  . folic acid  1 mg Oral Daily  . furosemide  40 mg Intravenous Once  . furosemide  40 mg Oral Daily  . imipramine  25 mg Oral QHS  . metoprolol tartrate  25 mg Oral BID  . pantoprazole  40 mg Oral QAC breakfast  . polyethylene glycol  17 g Oral Daily  . polysaccharide iron  150 mg Oral Daily  . simvastatin  40 mg Oral q1800  . sodium chloride  3 mL Intravenous Q12H  . Somatropin  0.5333 mg Subcutaneous QPM  . tolterodine  4 mg Oral Daily  . warfarin  2.5 mg Oral q1800  . DISCONTD: furosemide  20 mg Oral Daily     Radiology/Studies:  Dg Chest 2 View  10/28/2011  *RADIOLOGY REPORT*  Clinical Data: Shortness of breath  CHEST - 2 VIEW  Comparison: Most recent 10/26/2011  Findings: Cardiomegaly. Status post minimally invasive MVR. Bilateral pleural effusions, left greater than right, have increased from 10/26/2011, with increasing left base atelectasis. Mild right midlung zone scarring.  Mild interstitial prominence. Superimposed CHF not excluded.  IMPRESSION: Postoperative changes as described.  Left  basilar atelectasis and increasing left greater than right effusions; superimposed CHF may be present.  Original Report Authenticated By: Elsie Stain, M.D.    INR: Will add last result for INR, ABG once components are confirmed Will add last 4 CBG results once components are confirmed  Assessment/Plan: S/P Procedure(s) (LRB): MINIMALLY INVASIVE MITRAL VALVE REPAIR (MVR) (Right)  1. She appears stable. Low grade temp., on cipro for low grade UTI-Pseudomonas 2. Low level O2 requirements, may require at SNF 3. S/P transfusion, labs pending for today.INR pending 4. Diuretic increased, push pulm toilet as able along with  rehab 5. Poss d/c today  LOS: 14 days    Michaela [name],Michaela [name] 2/19/20137:39 AM    I have seen and examined the patient and agree with the assessment and plan as outlined.  Looks and feels better.  Hgb increased nicely.  Slept well last night.  Denies SOB.  Weight still 1.5 kg above baseline.  Probably needs lasix bid for awhile.  Possible d/c to [facility] today.   Avik Leoni H 10/29/2011 8:33 AM

## 2011-10-29 NOTE — Progress Notes (Signed)
Physical Therapy Treatment Patient Details Name: CALLA WEDEKIND MRN: [id number] DOB: 1956-09-08 Today's Date: 10/29/2011  PT Assessment/Plan  PT - Assessment/Plan Comments on Treatment Session: Pt s/p minimal MVR with continued progression with gait, controlled transfers and HEP. Pt encouraged to continue to ask for assist with transfers for safety. PT Plan: Discharge plan remains appropriate;Frequency remains appropriate Equipment Recommended: Defer to next venue PT Goals  Acute Rehab PT Goals PT Goal: Sit to Stand - Progress: Progressing toward goal PT Goal: Stand to Sit - Progress: Progressing toward goal Pt will Ambulate: 16 - 50 feet;with min assist;with least restrictive assistive device PT Goal: Ambulate - Progress: Progressing toward goal  PT Treatment Precautions/Restrictions  Precautions Precautions: Fall Precaution Comments: 0 Required Braces or Orthoses: No Restrictions Weight Bearing Restrictions: No Mobility (including Balance) Bed Mobility Bed Mobility: No Transfers Transfers: Yes Sit to Stand: 4: Min assist;From chair/3-in-1 Sit to Stand Details (indicate cue type and reason): sit to and from stand 7 times with cueing to push off surface/armrests rather than reaching for PT. Pt performed x 2 from [facility] and 5 times from recliner. Cueing to sequence and hand placement. Stand to Sit: 4: Min assist;To chair/3-in-1;With armrests Stand to Sit Details: minguard with cueing to control descent with use of hand rails Squat Pivot Transfers: 4: Min assist Squat Pivot Transfer Details (indicate cue type and reason): chair to bsc and back x 2 trials secondary to frequent urination with lasix. x3trials with 90 degree pivot and final trial with pt facing chair using the arm rests to stabilize then tunred 180degrees which is sequence at home. Ambulation/Gait Ambulation/Gait: Yes Ambulation/Gait Assistance: 4: Min assist Ambulation/Gait Assistance Details (indicate cue type and  reason): assist to direct RW and cueing to step into RWand for stability with gait, trunk flexed  Ambulation Distance (Feet): 15 Feet Assistive device: Rolling walker Gait Pattern: Ataxic;Trunk flexed;Step-to pattern Gait velocity: slow for safety with ataxia Stairs: No  Posture/Postural Control Posture/Postural Control: Postural limitations Exercise  General Exercises - Lower Extremity Long Arc Quad: AROM;Both;20 reps;Seated Hip Flexion/Marching: AROM;Both;Seated;Other reps (comment) Awilda Bill) End of Session PT - End of Session Activity Tolerance: Patient tolerated treatment well Patient left: in chair;with call bell in reach;Other (comment) (chair alarm) Nurse Communication: Mobility status for transfers;Mobility status for ambulation General Behavior During Session: [facility] for tasks performed Cognition: [facility] for tasks performed  Delorse Lek 10/29/2011, 11:21 AM Toney Sang, PT [phone]

## 2011-10-29 NOTE — Progress Notes (Signed)
Occupational Therapy Treatment Patient Details Name: Michaela [name] MRN: [id number] DOB: November 27, 1955 Today's Date: 10/29/2011  OT Assessment/Plan OT Assessment/Plan OT Plan: Discharge plan remains appropriate Follow Up Recommendations: Skilled nursing facility Equipment Recommended: Defer to next venue OT Goals ADL Goals ADL Goal: Grooming - Progress: Progressing toward goals ADL Goal: Lower Body Dressing - Progress: Progressing toward goals  OT Treatment Precautions/Restrictions  Precautions Precautions: Fall Restrictions Weight Bearing Restrictions: No   ADL ADL Grooming: Performed;Teeth care;Brushing hair;Minimal assistance Grooming Details (indicate cue type and reason): Pt required assist to hold pan to spit in secondary to UE tremors and writhing. Also, able to comb hair and place in ponytail with minimal assist Where Assessed - Grooming: Sitting, chair Lower Body Dressing: Performed;Supervision/safety;Set up Lower Body Dressing Details (indicate cue type and reason): dyspnea improved today, however, pt continues to c/o of incisional soreness in chest Where Assessed - Lower Body Dressing: Supported;Sitting, chair ADL Comments: Dyspnea appeared improved today. Patient had just finished bathing with NT thereby limiting OT session secondary to fatigue Educated pt on energy conservation techniques to use during BADLs. Answered all pt questions, Pt verbalized understanding. End of Session OT - End of Session Activity Tolerance: Patient limited by fatigue Patient left: in chair;with call bell in reach General Behavior During Session: [facility] for tasks performed Cognition: [facility] for tasks performed  Kanin Lia  10/29/2011, 10:51 AM

## 2011-10-29 NOTE — Discharge Summary (Signed)
                   [address]            [name] [address]          [phone]    Addendum Patient will need O2 at 2 liters nasal canula at nursing facility. May be weaned off for sats>90 %.   Also Lasix / Potassium to be BID for 5 days then may be changed to Q daily

## 2011-10-29 NOTE — Progress Notes (Signed)
Pt d/c to [facility]. CSW advised pt, pt family member, and Charity fundraiser of d/c plan.  [facility] to transport pt to SNF. Placed packet in [place]. CSW signing off.  Baxter Flattery, MSW [phone]

## 2011-10-29 NOTE — Discharge Summary (Signed)
I agree with the above discharge summary and plan for follow-up.  Zohal Reny H  

## 2011-10-29 NOTE — Discharge Planning (Signed)
[address]            [name] [address]          [phone]      Addendum to discharge summary  Michaela [name] has continued to progress. She does have a low grade UTI with pseudomonas that is being treated with Cipro as a precaution due to low grade fevers and some leukocytosis which is improving. Additionally, she has required transfusion for ABL anemia well as increased diuretic with a good response. She does have O2 requirement at times. Tentatively she is felt to be stable for discharge to SNF facility later today.   Current meds: Medication List  As of 10/29/2011  9:35 AM   STOP taking these medications         amiodarone 200 MG tablet      aspirin 81 MG EC tablet         TAKE these medications         ciprofloxacin 250 MG tablet   Commonly known as: CIPRO   Take 1 tablet (250 mg total) by mouth 2 (two) times daily.      dextromethorphan-guaiFENesin 30-600 MG per 12 hr tablet   Commonly known as: MUCINEX DM   Take 1 tablet by mouth 2 (two) times daily as needed (cough).      docusate sodium 100 MG capsule   Commonly known as: COLACE   Take 200 mg by mouth every evening.      folic acid 1 MG tablet   Commonly known as: FOLVITE   Take 1 tablet (1 mg total) by mouth daily.      furosemide 40 MG tablet   Commonly known as: LASIX   Take 1 tablet (40 mg total) by mouth 2 (two) times daily. Take twice daily for 5 days, then once daily      imipramine 25 MG tablet   Commonly known as: TOFRANIL   Take 25 mg by mouth at bedtime.      meloxicam 15 MG tablet   Commonly known as: MOBIC   Take 15 mg by mouth daily as needed. For inflammation      metoprolol tartrate 25 MG tablet   Commonly known as: LOPRESSOR   Take 1 tablet (25 mg total) by mouth 2 (two) times daily.      NORDITROPIN FLEXPRO 10 MG/1.5ML Soln   Generic drug: Somatropin   Inject 0.5 mg into the skin every evening.      polyethylene glycol packet   Commonly known as:  MIRALAX / GLYCOLAX   Take 17 g by mouth daily.      polysaccharide iron 150 MG Caps capsule   Commonly known as: NIFEREX   Take 1 capsule (150 mg total) by mouth daily.      potassium chloride 10 MEQ tablet   Commonly known as: K-DUR   Take 2 tablets (20 mEq total) by mouth 2 (two) times daily. For 5 days, then once daily      RECLAST 5 MG/100ML Soln   Generic drug: zoledronic acid   Inject 5 mg into the vein once. Once a year      simvastatin 40 MG tablet   Commonly known as: ZOCOR   Take 40 mg by mouth daily.      tolterodine 4 MG 24 hr capsule   Commonly known as: DETROL LA   Take  4 mg by mouth daily.      traMADol 50 MG tablet   Commonly known as: ULTRAM   Take 1-2 tablets (50-100 mg total) by mouth every 6 (six) hours as needed for pain.      warfarin 5 MG tablet   Commonly known as: COUMADIN   Take 0.5 tablets (2.5 mg total) by mouth daily at 6 PM. Or as directed by Dr. Alford Highland office.

## 2011-10-31 DIAGNOSIS — E785 Hyperlipidemia, unspecified: Secondary | ICD-10-CM | POA: Diagnosis not present

## 2011-10-31 DIAGNOSIS — Z954 Presence of other heart-valve replacement: Secondary | ICD-10-CM | POA: Diagnosis not present

## 2011-10-31 DIAGNOSIS — N390 Urinary tract infection, site not specified: Secondary | ICD-10-CM | POA: Diagnosis not present

## 2011-10-31 DIAGNOSIS — D649 Anemia, unspecified: Secondary | ICD-10-CM | POA: Diagnosis not present

## 2011-10-31 LAB — CULTURE, BLOOD (ROUTINE X 2)
Culture  Setup Time: 201302151706
Culture: NO GROWTH

## 2011-11-01 ENCOUNTER — Ambulatory Visit: Payer: Self-pay | Admitting: Thoracic Surgery (Cardiothoracic Vascular Surgery)

## 2011-11-01 NOTE — Progress Notes (Signed)
   CARE MANAGEMENT NOTE 11/01/2011  Patient:  Michaela [name],Michaela [name]   Account Number:  [id number]  Date Initiated:  10/16/2011  Documentation initiated by:  Bartolo Montanye  Subjective/Objective Assessment:   PT S/P MVR ON 10/15/11.     Action/Plan:   PTA, PT LIVES ALONE; PLANS TO DISCHARGE TO SNF FOR REHAB   Anticipated DC Date:  10/21/2011   Anticipated DC Plan:  SKILLED NURSING FACILITY  In-house referral  Clinical Social Worker      DC Planning Services  CM consult      Choice offered to / List presented to:             Status of service:  Completed, signed off Medicare Important Message given?   (If response is "NO", the following Medicare IM given date fields will be blank) Date Medicare IM given:   Date Additional Medicare IM given:    Discharge Disposition:  SKILLED NURSING FACILITY  Per UR Regulation:    Comments:  10/29/11 [name],RN,BSN PT FOR DISCHARGE TO SNF TODAY, PER CSW ARRANGEMENTS.   10/21/11 [name],RN,BSN 1533 PLAN DC TO [facility], PER CSW ARRANGEMENTS WHEN STABLE, LIKELY 1-2 DAYS.  10/16/11 [name],RN,BSN 1400 REFERRAL TO CSW TO FACILITATE DISCHARGE TO SNF FOR REHAB WHEN MEDICALLY STABLE.  WILL CONSULT PT AND OT. WILL FOLLOW.

## 2011-11-06 ENCOUNTER — Other Ambulatory Visit: Payer: Self-pay | Admitting: Thoracic Surgery (Cardiothoracic Vascular Surgery)

## 2011-11-06 DIAGNOSIS — I059 Rheumatic mitral valve disease, unspecified: Secondary | ICD-10-CM

## 2011-11-06 NOTE — Discharge Summary (Signed)
                 [address]            [place] [address]          [phone]      Addendum to discharge summary  Ms. Rakers has continued to progress. She does have a low grade UTI with pseudomonas that is being treated with Cipro as a precaution due to low grade fevers and some leukocytosis which is improving. Additionally, she has required transfusion for ABL anemia well as increased diuretic with a good response. She does have O2 requirement at times. Tentatively she is felt to be stable for discharge to SNF facility later today.   Current meds: Medication List  As of 10/29/2011  9:35 AM   STOP taking these medications         amiodarone 200 MG tablet      aspirin 81 MG EC tablet         TAKE these medications         ciprofloxacin 250 MG tablet   Commonly known as: CIPRO   Take 1 tablet (250 mg total) by mouth 2 (two) times daily.      dextromethorphan-guaiFENesin 30-600 MG per 12 hr tablet   Commonly known as: MUCINEX DM   Take 1 tablet by mouth 2 (two) times daily as needed (cough).      docusate sodium 100 MG capsule   Commonly known as: COLACE   Take 200 mg by mouth every evening.      folic acid 1 MG tablet   Commonly known as: FOLVITE   Take 1 tablet (1 mg total) by mouth daily.      furosemide 40 MG tablet   Commonly known as: LASIX   Take 1 tablet (40 mg total) by mouth 2 (two) times daily. Take twice daily for 5 days, then once daily      imipramine 25 MG tablet   Commonly known as: TOFRANIL   Take 25 mg by mouth at bedtime.      meloxicam 15 MG tablet   Commonly known as: MOBIC   Take 15 mg by mouth daily as needed. For inflammation      metoprolol tartrate 25 MG tablet   Commonly known as: LOPRESSOR   Take 1 tablet (25 mg total) by mouth 2 (two) times daily.      NORDITROPIN FLEXPRO 10 MG/1.5ML Soln   Generic drug: Somatropin   Inject 0.5 mg into the skin every evening.      polyethylene glycol packet   Commonly known as:  MIRALAX / GLYCOLAX   Take 17 g by mouth daily.      polysaccharide iron 150 MG Caps capsule   Commonly known as: NIFEREX   Take 1 capsule (150 mg total) by mouth daily.      potassium chloride 10 MEQ tablet   Commonly known as: K-DUR   Take 2 tablets (20 mEq total) by mouth 2 (two) times daily. For 5 days, then once daily      RECLAST 5 MG/100ML Soln   Generic drug: zoledronic acid   Inject 5 mg into the vein once. Once a year      simvastatin 40 MG tablet   Commonly known as: ZOCOR   Take 40 mg by mouth daily.      tolterodine 4 MG 24 hr capsule   Commonly known as: DETROL LA   Take   4 mg by mouth daily.      traMADol 50 MG tablet   Commonly known as: ULTRAM   Take 1-2 tablets (50-100 mg total) by mouth every 6 (six) hours as needed for pain.      warfarin 5 MG tablet   Commonly known as: COUMADIN   Take 0.5 tablets (2.5 mg total) by mouth daily at 6 PM. Or as directed by Dr. Mclean's office.           

## 2011-11-06 NOTE — Discharge Summary (Signed)
I agree with the above discharge summary and plan for follow-up.  Singleton Hickox H  

## 2011-11-07 DIAGNOSIS — G8250 Quadriplegia, unspecified: Secondary | ICD-10-CM | POA: Diagnosis not present

## 2011-11-07 DIAGNOSIS — E7800 Pure hypercholesterolemia, unspecified: Secondary | ICD-10-CM | POA: Diagnosis not present

## 2011-11-07 DIAGNOSIS — R32 Unspecified urinary incontinence: Secondary | ICD-10-CM | POA: Diagnosis not present

## 2011-11-07 DIAGNOSIS — I059 Rheumatic mitral valve disease, unspecified: Secondary | ICD-10-CM | POA: Diagnosis not present

## 2011-11-08 ENCOUNTER — Ambulatory Visit
Admission: RE | Admit: 2011-11-08 | Discharge: 2011-11-08 | Disposition: A | Discharge status: Home / Self Care | Payer: Medicare Other | Source: Ambulatory Visit | Attending: Thoracic Surgery (Cardiothoracic Vascular Surgery) | Admitting: Thoracic Surgery (Cardiothoracic Vascular Surgery)

## 2011-11-08 DIAGNOSIS — J90 Pleural effusion, not elsewhere classified: Secondary | ICD-10-CM | POA: Diagnosis not present

## 2011-11-08 DIAGNOSIS — A0472 Enterocolitis due to Clostridium difficile, not specified as recurrent: Secondary | ICD-10-CM

## 2011-11-08 DIAGNOSIS — R0602 Shortness of breath: Secondary | ICD-10-CM | POA: Diagnosis not present

## 2011-11-08 DIAGNOSIS — I059 Rheumatic mitral valve disease, unspecified: Secondary | ICD-10-CM

## 2011-11-08 DIAGNOSIS — J9819 Other pulmonary collapse: Secondary | ICD-10-CM | POA: Diagnosis not present

## 2011-11-08 HISTORY — DX: Enterocolitis due to Clostridium difficile, not specified as recurrent: A04.72

## 2011-11-11 ENCOUNTER — Other Ambulatory Visit: Payer: Self-pay | Admitting: Thoracic Surgery (Cardiothoracic Vascular Surgery)

## 2011-11-11 ENCOUNTER — Ambulatory Visit (INDEPENDENT_AMBULATORY_CARE_PROVIDER_SITE_OTHER): Payer: Self-pay | Admitting: Thoracic Surgery (Cardiothoracic Vascular Surgery)

## 2011-11-11 ENCOUNTER — Encounter: Payer: Self-pay | Admitting: Thoracic Surgery (Cardiothoracic Vascular Surgery)

## 2011-11-11 ENCOUNTER — Ambulatory Visit
Admission: RE | Admit: 2011-11-11 | Discharge: 2011-11-11 | Disposition: A | Discharge status: Home / Self Care | Payer: Medicare Other | Source: Ambulatory Visit | Attending: Thoracic Surgery (Cardiothoracic Vascular Surgery) | Admitting: Thoracic Surgery (Cardiothoracic Vascular Surgery)

## 2011-11-11 DIAGNOSIS — Z9889 Other specified postprocedural states: Secondary | ICD-10-CM

## 2011-11-11 DIAGNOSIS — J90 Pleural effusion, not elsewhere classified: Secondary | ICD-10-CM

## 2011-11-11 DIAGNOSIS — R918 Other nonspecific abnormal finding of lung field: Secondary | ICD-10-CM | POA: Diagnosis not present

## 2011-11-11 DIAGNOSIS — J9819 Other pulmonary collapse: Secondary | ICD-10-CM | POA: Diagnosis not present

## 2011-11-11 HISTORY — DX: Pleural effusion, not elsewhere classified: J90

## 2011-11-11 LAB — PROTIME-INR: Prothrombin Time: 20.1 seconds — ABNORMAL HIGH (ref 11.6–15.2)

## 2011-11-11 NOTE — Progress Notes (Signed)
[address]            [name] [address]          [phone]     CARDIOTHORACIC SURGERY OFFICE NOTE  Referring Provider is [name], MD PCP is Julian Hy, MD, MD   HPI:  Patient returns for followup status post mitral valve repair on 10/15/2011. The patient's postoperative recovery in the hospital was somewhat slow and notable for the presence of low-grade fever and leukocytosis ultimately attributed to urinary tract infection do to Pseudomonas aeruginosa.  She was treated with a full weeks course of oral ciprofloxacin. Her physical recovery was not unexpectedly slow given her baseline limitations, and she did require oxygen at the time of discharge. She was discharged to skilled nursing facility locally at [facility].  She returns to the office for followup today. Her mother and sister state that she has not been getting bathed every day and that she has not had much attension in terms of working on her physical activity. Over the last few days she's had increased shortness of breath and a tendency to lay on her right side. She has not had any fevers or chills. She has been eating some but she does not care for the food much. She has not yet been seen in followup by Dr. Alford Highland office and it is unclear whether or not a prothrombin time has been checked since hospital discharge.  Current Outpatient Prescriptions  Medication Sig Dispense Refill  . docusate sodium (COLACE) 100 MG capsule Take 200 mg by mouth every evening.        . folic acid (FOLVITE) 1 MG tablet Take 1 tablet (1 mg total) by mouth daily.  30 tablet  1  . furosemide (LASIX) 40 MG tablet Take 40 mg by mouth daily.      Marland Kitchen guaiFENesin (MUCINEX) 600 MG 12 hr tablet Take 1,200 mg by mouth 2 (two) times daily as needed.      Marland Kitchen imipramine (TOFRANIL) 25 MG tablet Take 25 mg by mouth at bedtime.       . meloxicam (MOBIC) 15 MG tablet Take 15 mg by mouth daily as needed. For  inflammation      . metoprolol (LOPRESSOR) 25 MG tablet Take 1 tablet (25 mg total) by mouth 2 (two) times daily.  60 tablet  1  . polyethylene glycol (MIRALAX / GLYCOLAX) packet Take 17 g by mouth daily.        . polysaccharide iron (NIFEREX) 150 MG CAPS capsule Take 1 capsule (150 mg total) by mouth daily.  30 each  0  . potassium chloride (K-DUR) 10 MEQ tablet Take 20 mEq by mouth 1 day or 1 dose.      . pravastatin (PRAVACHOL) 80 MG tablet Take 80 mg by mouth daily.      . Somatropin (NORDITROPIN FLEXPRO) 10 MG/1.5ML SOLN Inject 0.5 mg into the skin every evening.      . tolterodine (DETROL LA) 4 MG 24 hr capsule Take 4 mg by mouth daily.       . traMADol (ULTRAM) 50 MG tablet Take 50 mg by mouth every 6 (six) hours as needed.      . warfarin (COUMADIN) 5 MG tablet Take 0.5 tablets (2.5 mg total) by mouth daily at 6 PM. Or as directed by Dr. Alford Highland office.  30 tablet  1  . zoledronic  acid (RECLAST) 5 MG/100ML SOLN Inject 5 mg into the vein once. Once a year      . DISCONTD: furosemide (LASIX) 40 MG tablet Take 1 tablet (40 mg total) by mouth 2 (two) times daily. Take twice daily for 5 days, then once daily  35 tablet  1  . DISCONTD: potassium chloride (KLOR-CON 10) 10 MEQ tablet Take 2 tablets (20 mEq total) by mouth 2 (two) times daily. For 5 days, then once daily  35 tablet  1      Physical Exam:   BP 83/56  Pulse 107  Resp 20  Ht 4\' 10"  (1.473 m)  SpO2 99%  General:  Thin and frail  Chest:   Diminished breath sounds on left side  CV:   RRR without murmur  Incisions:  Healing nicely  Abdomen:  Soft, non tender  Extremities:  Warm, well perfused, no edema  Diagnostic Tests:  CHEST - 2 VIEW  11/08/2011 Comparison: Chest x-ray of 10/28/2011  Findings: There has been a significant increase in volume of the  left pleural effusion with volume loss on the left. Mild  atelectasis at the right lung base is noted. Cardiomegaly is  stable. Mitral valve replacement is noted.    IMPRESSION:  Increase in volume of left effusion with a moderate to large left  effusion now present.  Original Report Authenticated By: Juline Patch, M.D.    Impression:  Patient has developed a large left pleural effusion associated with shortness of breath and decreased energy. She otherwise does not appear to have much fluid overload on physical exam.  Her heart rate remains somewhat elevated, but this is not unexpected under the circumstances.  She remains quite weak.  Plan:  Following full informed consent the patient underwent left needle thoracentesis in the office today. Total of 1200 mL of thin serous fluid was evacuated uneventfully. The procedure was tolerated well.   We will obtain followup chest x-ray today as well as complete blood count, comprehensive metabolic panel, pro-BNP level and prothrombin time. We will also check a followup urinalysis and urine culture. We will have the patient return in 2 weeks with a repeat chest x-ray. We'll make sure that her prothrombin time results are forwarded to the [facility] and plans are made for repeat Coumadin testing as needed. We will also make sure that the patient has a appointment scheduled to see Dr. Shirlee Latch in the near future.   Salvatore Decent. Cornelius Moras, MD 11/11/2011 12:57 PM

## 2011-11-11 NOTE — Patient Instructions (Signed)
Bathe or shower every day.  Continue to work on increased mobility as much as possible.  Record weight every day.

## 2011-11-12 LAB — COMPREHENSIVE METABOLIC PANEL
ALT: 64 U/L — ABNORMAL HIGH (ref 0–35)
AST: 55 U/L — ABNORMAL HIGH (ref 0–37)
Albumin: 3 g/dL — ABNORMAL LOW (ref 3.5–5.2)
Alkaline Phosphatase: 170 U/L — ABNORMAL HIGH (ref 39–117)
BUN: 23 mg/dL (ref 6–23)
Calcium: 8.1 mg/dL — ABNORMAL LOW (ref 8.4–10.5)
Chloride: 97 mEq/L (ref 96–112)
Potassium: 4.2 mEq/L (ref 3.5–5.3)
Sodium: 137 mEq/L (ref 135–145)
Total Protein: 5.8 g/dL — ABNORMAL LOW (ref 6.0–8.3)

## 2011-11-12 LAB — CBC
Hemoglobin: 12 g/dL (ref 12.0–15.0)
MCH: 27.5 pg (ref 26.0–34.0)
Platelets: 129 10*3/uL — ABNORMAL LOW (ref 150–400)
RBC: 4.36 MIL/uL (ref 3.87–5.11)
WBC: 15.1 10*3/uL — ABNORMAL HIGH (ref 4.0–10.5)

## 2011-11-13 ENCOUNTER — Encounter: Payer: Self-pay | Admitting: Cardiology

## 2011-11-13 ENCOUNTER — Ambulatory Visit (INDEPENDENT_AMBULATORY_CARE_PROVIDER_SITE_OTHER): Payer: Medicare Other | Admitting: Cardiology

## 2011-11-13 ENCOUNTER — Ambulatory Visit: Payer: Medicare Other | Admitting: Cardiology

## 2011-11-13 DIAGNOSIS — R0602 Shortness of breath: Secondary | ICD-10-CM | POA: Diagnosis not present

## 2011-11-13 DIAGNOSIS — I059 Rheumatic mitral valve disease, unspecified: Secondary | ICD-10-CM

## 2011-11-13 DIAGNOSIS — J189 Pneumonia, unspecified organism: Secondary | ICD-10-CM

## 2011-11-13 DIAGNOSIS — Z9889 Other specified postprocedural states: Secondary | ICD-10-CM

## 2011-11-13 DIAGNOSIS — I509 Heart failure, unspecified: Secondary | ICD-10-CM

## 2011-11-13 DIAGNOSIS — I5032 Chronic diastolic (congestive) heart failure: Secondary | ICD-10-CM | POA: Insufficient documentation

## 2011-11-13 MED ORDER — METOPROLOL TARTRATE 12.5 MG HALF TABLET: 12.5000 mg | ORAL_TABLET | Freq: Two times a day (BID) | ORAL | Status: DC

## 2011-11-13 MED ORDER — FUROSEMIDE 20 MG PO TABS: ORAL_TABLET | ORAL | Status: DC

## 2011-11-13 MED ORDER — LEVOFLOXACIN 500 MG PO TABS: 500.0000 mg | ORAL_TABLET | Freq: Every day | ORAL | Status: DC

## 2011-11-13 MED ORDER — POTASSIUM CHLORIDE ER 10 MEQ PO TBCR: 20.0000 meq | EXTENDED_RELEASE_TABLET | Freq: Two times a day (BID) | ORAL | Status: DC

## 2011-11-13 NOTE — Assessment & Plan Note (Signed)
Michaela [name] feels better after thoracentesis but still appears somewhat volume overloaded.  I am going to increase Lasix to 40 mg bid x 5 days, then 40 qam/20qpm.  Increase KCl to 20 bid.  BP is running low, so I will decrease metoprolol to 12.5 mg bid.  I will have her get a BMET/BNP in 1 week and followup with me.

## 2011-11-13 NOTE — Progress Notes (Signed)
PCP: Dr. 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  At baseline, patient has poor balance from ataxia and can walk limited distances with a walker but typically uses her wheelchair.  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  She has been going to the [facility] twice a week and uses a rowing machine.  She also does yoga. This past summer, she noted more shortness of breath at night.  She had episodes consistent with PND.  She has chronically had to sleep on 2 pillows due to a sensation of "smothering."  TTE suggested significant mitral regurgitation.  I did a TEE to more closely assess the valve in 7/12.  This confirmed severe MR with bileaflet prolapse and EF 60%.  I started her on Lasix 20 mg daily (later increased to 20 mg bid), and she reports significant improvement in breathing.  She underwent minimally invasive mitral valve repair in 2/13 with Alfieri edge to edge repair and annuloplasty.  Post-op course was complicated by Pseudomonas UTI treated with Cipro.  Echo post-op showed normal EF, no MR, and mild to moderate mitral stenosis.    She was discharged to [facility].  Last week, she had become significantly dyspneic.  She saw Dr. Cornelius Moras earlier this week and was noted to have a left-sided pleural effusion.  Thoracentesis was done.  Her breathing has been much better since the thoracentesis. She does continue to feel feverish on occasion.  CXR post-thoracentesis showed resolved left effusion but there was a nodular opacity in the right mid lung with some concern for possible PNA.  She is no longer on oxygen.  No dysuria.  SBP is in the 80s today.  She denies lightheadedness.   ECG: sinus tachy at 105, LAE  Labs (6/12): BNP 88 Labs (7/12): K 3.9, creatinine 0.6, BNP 86 Labs (10/12): K 3.7, creatinine 0.6 Labs (3/13): WBCs 15, HCT 40.5, plts 129, AST 55, ALt  64, BNP 6897  PMH:  1. Mitral valve prolapse with mitral regurgitation.  Echo (2/06): Moderate MR.  Echo (6/12): EF 60%, normal LV size, indeterminant diastolic function (suspect pseudonormal), bileaflet mitral valve prolapse with severe mitral regurgitation, moderate LAE, PA systolic pressure 28 mmHg.  TEE (7/12): EF 60%, normal LV size, severe MR with bileaflet prolapse, RV-RA gradient 30 mmHg.  Minimally invasive mitral valve repair with complex Alfieri edge-to-edge repair and annuloplasty ring.  Echo (2/13) showed EF 55-60% with septal bounce, MV repair with Alfieri stitch, no MR, possible mild to moderate mitral stenosis with normal RV and PASP 41 mmHg.  2. Astrocytoma s/p resection and radiation in 1971.  She was left with residual spasticity and ataxia and is mainly limited to a wheelchair.  3. Hyperlipidemia 4. Arterial dopplers (2/09): normal 5. Diastolic CHF  SH: Uses wheelchair but can walk short distances with walker. Lives by herself in [place] but sister and mother help.  Nonsmoker.  Occasional ETOH.   FH: Father with MI at 55  ROS: all systems reviewed and negative except as per HPI.   Current Outpatient Prescriptions  Medication Sig Dispense Refill  . docusate sodium (COLACE) 100 MG capsule Take 200 mg by mouth every evening.        . folic acid (FOLVITE) 1 MG tablet Take 1 tablet (1 mg total) by mouth daily.  30 tablet  1  . furosemide (LASIX) 20 MG tablet Take 40mg  (  2 tabs) twice daily x 5 days, then take 40 mg (2 tabs) in the am and 20mg  (1 tab) in the pm.  90 tablet  11  . guaiFENesin (MUCINEX) 600 MG 12 hr tablet Take 1,200 mg by mouth 2 (two) times daily as needed.      Marland Kitchen imipramine (TOFRANIL) 25 MG tablet Take 25 mg by mouth at bedtime.       . meloxicam (MOBIC) 15 MG tablet Take 15 mg by mouth daily as needed. For inflammation      . metoprolol tartrate (LOPRESSOR) 12.5 mg TABS Take 0.5 tablets (12.5 mg total) by mouth 2 (two) times daily.  60 tablet  1  .  polyethylene glycol (MIRALAX / GLYCOLAX) packet Take 17 g by mouth daily.        . polysaccharide iron (NIFEREX) 150 MG CAPS capsule Take 1 capsule (150 mg total) by mouth daily.  30 each  0  . potassium chloride (K-DUR) 10 MEQ tablet Take 2 tablets (20 mEq total) by mouth 2 (two) times daily.  120 tablet  11  . pravastatin (PRAVACHOL) 80 MG tablet Take 80 mg by mouth daily.      . Somatropin (NORDITROPIN FLEXPRO) 10 MG/1.5ML SOLN Inject 0.5 mg into the skin every evening.      . tolterodine (DETROL LA) 4 MG 24 hr capsule Take 4 mg by mouth daily.       . traMADol (ULTRAM) 50 MG tablet Take 50 mg by mouth every 6 (six) hours as needed.      . warfarin (COUMADIN) 5 MG tablet Take 0.5 tablets (2.5 mg total) by mouth daily at 6 PM. Or as directed by Dr. Alford Highland office.  30 tablet  1  . zoledronic acid (RECLAST) 5 MG/100ML SOLN Inject 5 mg into the vein once. Once a year      . DISCONTD: furosemide (LASIX) 40 MG tablet Take 40 mg by mouth daily.      Marland Kitchen DISCONTD: potassium chloride (K-DUR) 10 MEQ tablet Take 20 mEq by mouth 1 day or 1 dose.      . levofloxacin (LEVAQUIN) 500 MG tablet Take 1 tablet (500 mg total) by mouth daily. For 14 days  14 tablet  0    BP 85/48  Pulse 110  Wt 88 lb (39.917 kg) General: thin, no distress Neck: JVP 8-9 cm, no thyromegaly or thyroid nodule.  Lungs: Clear to auscultation bilaterally with normal respiratory effort. CV: Nondisplaced PMI.  Heart regular S1/S2, no S3/S4, no murmur.  No peripheral edema.  No carotid bruit.  Normal pedal pulses.  Abdomen: Soft, nontender, no hepatosplenomegaly, no distention.  Neurologic: Alert and oriented x 3, gait ataxia, resting tremor noted in hands.  Psych: Normal affect. Extremities: No clubbing or cyanosis.

## 2011-11-13 NOTE — Assessment & Plan Note (Addendum)
Potential PNA on CXR, elevated WBCs on CBC earlier this week.  Subjective fevers.  I will give her a course of levofloxacin 500 mg daily x 14 days (given her size, suspect 500 mg daily will be equivalent to the 750 mg daily pneumonia dosing).  I am going to get a followup CXR when I see her in a week.

## 2011-11-13 NOTE — Assessment & Plan Note (Signed)
Mitral valve repair with Alfieri technique.  On echo while in the hospital, there was normal LV systolic function and no mitral regurgitation.  There appeared to be mild to moderate mitral stenosis but I question how much of this was due to a high flow state post-operatively.  I will repeat an echo in a month or two to reassess the mitral valve.

## 2011-11-13 NOTE — Patient Instructions (Signed)
Your physician recommends that you schedule a follow-up appointment in: one week with Dr Shirlee Latch  Your physician has recommended you make the following change in your medication: Increase your lasix to 40mg  twice daily for 5 days, then decrease to 40mg  am and 20mg  pm,  Increase your potassium to 20 meq twice daily, decrease your metoprolol to 12.5mg  twice daily, start Levofloxacin 500mg  daily  Your physician recommends that you return for lab work in: next week at your appt (bmet, bnp)

## 2011-11-15 LAB — BODY FLUID CULTURE: Gram Stain: NONE SEEN

## 2011-11-18 ENCOUNTER — Encounter: Payer: Self-pay | Admitting: Cardiology

## 2011-11-18 LAB — PROTIME-INR

## 2011-11-19 ENCOUNTER — Telehealth: Payer: Self-pay | Admitting: Cardiology

## 2011-11-19 ENCOUNTER — Other Ambulatory Visit: Payer: Self-pay | Admitting: Thoracic Surgery (Cardiothoracic Vascular Surgery)

## 2011-11-19 DIAGNOSIS — I059 Rheumatic mitral valve disease, unspecified: Secondary | ICD-10-CM

## 2011-11-19 NOTE — Telephone Encounter (Signed)
Please call and make sure she was seen by urgent care, etc for evaluation of diarrhea.

## 2011-11-19 NOTE — Telephone Encounter (Signed)
She needs to be checked for C. Difficile.  She has been on an antibiotic.

## 2011-11-19 NOTE — Telephone Encounter (Signed)
I recommended that she go to Urgent Care or contact her pcp today.  Pt's sister wants Dr Shirlee Latch to know what is going on.

## 2011-11-19 NOTE — Progress Notes (Signed)
Addended by: Judithe Modest D on: 11/19/2011 04:20 PM   Modules accepted: Orders

## 2011-11-19 NOTE — Telephone Encounter (Signed)
Pt is having diarrhea for a week now and very weak and her sister would like a call back asap

## 2011-11-20 ENCOUNTER — Ambulatory Visit (INDEPENDENT_AMBULATORY_CARE_PROVIDER_SITE_OTHER): Payer: Medicare Other | Admitting: Cardiology

## 2011-11-20 ENCOUNTER — Encounter: Payer: Self-pay | Admitting: Cardiology

## 2011-11-20 VITALS — BP 102/62 | HR 60 | Wt 80.1 lb

## 2011-11-20 DIAGNOSIS — I5032 Chronic diastolic (congestive) heart failure: Secondary | ICD-10-CM | POA: Diagnosis not present

## 2011-11-20 DIAGNOSIS — I509 Heart failure, unspecified: Secondary | ICD-10-CM

## 2011-11-20 DIAGNOSIS — J189 Pneumonia, unspecified organism: Secondary | ICD-10-CM

## 2011-11-20 DIAGNOSIS — R197 Diarrhea, unspecified: Secondary | ICD-10-CM

## 2011-11-20 DIAGNOSIS — I340 Nonrheumatic mitral (valve) insufficiency: Secondary | ICD-10-CM

## 2011-11-20 DIAGNOSIS — I059 Rheumatic mitral valve disease, unspecified: Secondary | ICD-10-CM

## 2011-11-20 DIAGNOSIS — R195 Other fecal abnormalities: Secondary | ICD-10-CM

## 2011-11-20 LAB — BASIC METABOLIC PANEL
BUN: 22 mg/dL (ref 6–23)
Calcium: 8.2 mg/dL — ABNORMAL LOW (ref 8.4–10.5)
Creatinine, Ser: 0.9 mg/dL (ref 0.4–1.2)
GFR: 74.09 mL/min (ref >60.00)
Glucose, Bld: 116 mg/dL — ABNORMAL HIGH (ref 70–99)
Potassium: 3.9 mEq/L (ref 3.5–5.1)

## 2011-11-20 MED ORDER — FUROSEMIDE 20 MG PO TABS: 20.0000 mg | ORAL_TABLET | Freq: Every day | ORAL | Status: DC

## 2011-11-20 NOTE — Assessment & Plan Note (Signed)
Stool has been black.  ? Upper GI bleed.  Awaiting CBC (sent this am).  If significantly lower, will stop coumadin.

## 2011-11-20 NOTE — Assessment & Plan Note (Signed)
I am afraid she may have C difficile diarrhea.  She has had antibiotics both in the hospital and since discharge.  C difficile test from stool was sent yesterday, will need treatment with Flagyl asap if positive.

## 2011-11-20 NOTE — Progress Notes (Signed)
PCP: Dr. 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  At baseline, patient has poor balance from ataxia and can walk limited distances with a walker but typically uses her wheelchair.  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  She has been going to the [facility] twice a week and uses a rowing machine.  She also does yoga. This past summer, she noted more shortness of breath at night.  She had episodes consistent with PND.  She has chronically had to sleep on 2 pillows due to a sensation of "smothering."  TTE suggested significant mitral regurgitation.  I did a TEE to more closely assess the valve in 7/12.  This confirmed severe MR with bileaflet prolapse and EF 60%.  I started her on Lasix 20 mg daily (later increased to 20 mg bid), and she reports significant improvement in breathing.  She underwent minimally invasive mitral valve repair in 2/13 with Alfieri edge to edge repair and annuloplasty.  Post-op course was complicated by Pseudomonas UTI treated with Cipro.  Echo post-op showed normal EF, no MR, and mild to moderate mitral stenosis.    She was discharged to [facility].  She developed dyspnea after discharge and was found to have a large left pleural effusion.  She underwent thoracentesis and Lasix was increased, with resolution of dyspnea.  I also started her on levofloxacin because of rise in WBCs and right-sided infiltrate on CXR.  At last appointment, she looked fairly good.  Today, she looks much worse.  She feels weak and has not been eating due to nausea.  She has been having 3 episodes daily of loose stool.  The stool is black and foul-smelling.  BP is stable and she is mildly tachycardic, more than at last appointment.  Weight is down 8 lbs.    ECG: sinus tachy at 115, LAE  Labs (6/12): BNP 88 Labs (7/12): K 3.9, creatinine 0.6, BNP 86 Labs (10/12): K  3.7, creatinine 0.6 Labs (3/13): WBCs 15, HCT 40.5, plts 129, AST 55, ALT 64, BNP 6897  PMH:  1. Mitral valve prolapse with mitral regurgitation.  Echo (2/06): Moderate MR.  Echo (6/12): EF 60%, normal LV size, indeterminant diastolic function (suspect pseudonormal), bileaflet mitral valve prolapse with severe mitral regurgitation, moderate LAE, PA systolic pressure 28 mmHg.  TEE (7/12): EF 60%, normal LV size, severe MR with bileaflet prolapse, RV-RA gradient 30 mmHg.  Minimally invasive mitral valve repair with complex Alfieri edge-to-edge repair and annuloplasty ring.  Echo (2/13) showed EF 55-60% with septal bounce, MV repair with Alfieri stitch, no MR, possible mild to moderate mitral stenosis with normal RV and PASP 41 mmHg.  2. Astrocytoma s/p resection and radiation in 1971.  She was left with residual spasticity and ataxia and is mainly limited to a wheelchair.  3. Hyperlipidemia 4. Arterial dopplers (2/09): normal 5. Diastolic CHF  SH: Uses wheelchair but can walk short distances with walker. Lives by herself in [place] but sister and mother help.  Nonsmoker.  Occasional ETOH.   FH: Father with MI at 2  ROS: all systems reviewed and negative except as per HPI.   Current Outpatient Prescriptions  Medication Sig Dispense Refill  . docusate sodium (COLACE) 100 MG capsule Take 200 mg by mouth every evening.        . folic acid (FOLVITE) 1 MG tablet Take 1 tablet (1 mg  total) by mouth daily.  30 tablet  1  . guaiFENesin (MUCINEX) 600 MG 12 hr tablet Take 1,200 mg by mouth 2 (two) times daily as needed.      Marland Kitchen imipramine (TOFRANIL) 25 MG tablet Take 25 mg by mouth at bedtime.       Marland Kitchen levofloxacin (LEVAQUIN) 500 MG tablet Take 1 tablet (500 mg total) by mouth daily. For 14 days  14 tablet  0  . meloxicam (MOBIC) 15 MG tablet Take 15 mg by mouth daily as needed. For inflammation      . metoprolol tartrate (LOPRESSOR) 12.5 mg TABS Take 0.5 tablets (12.5 mg total) by mouth 2 (two) times  daily.  60 tablet  1  . polyethylene glycol (MIRALAX / GLYCOLAX) packet Take 17 g by mouth daily.        . polysaccharide iron (NIFEREX) 150 MG CAPS capsule Take 1 capsule (150 mg total) by mouth daily.  30 each  0  . potassium chloride (K-DUR) 10 MEQ tablet Take 2 tablets (20 mEq total) by mouth 2 (two) times daily.  120 tablet  11  . simvastatin (ZOCOR) 40 MG tablet Take 40 mg by mouth every evening.      . Somatropin (NORDITROPIN FLEXPRO) 10 MG/1.5ML SOLN Inject 0.5 mg into the skin every evening.      . tolterodine (DETROL LA) 4 MG 24 hr capsule Take 4 mg by mouth daily.       . traMADol (ULTRAM) 50 MG tablet Take 50 mg by mouth every 6 (six) hours as needed.      . warfarin (COUMADIN) 5 MG tablet Take 0.5 tablets (2.5 mg total) by mouth daily at 6 PM. Or as directed by Dr. Alford Highland office.  30 tablet  1  . zoledronic acid (RECLAST) 5 MG/100ML SOLN Inject 5 mg into the vein once. Once a year      . DISCONTD: furosemide (LASIX) 20 MG tablet Take 40mg  (2 tabs) twice daily x 5 days, then take 40 mg (2 tabs) in the am and 20mg  (1 tab) in the pm.  90 tablet  11  . furosemide (LASIX) 20 MG tablet Take 1 tablet (20 mg total) by mouth daily.  30 tablet  11    BP 102/62  Pulse 60  Wt 80 lb 1.9 oz (36.342 kg) General: thin, no distress Neck: JVP not elevated, no thyromegaly or thyroid nodule.  Lungs: Slight decreased breath sounds at left base.  CV: Nondisplaced PMI.  Heart regular S1/S2, no S3/S4, no murmur.  No peripheral edema.  No carotid bruit.  Normal pedal pulses.  Abdomen: Soft, nontender, no hepatosplenomegaly, no distention.  Neurologic: Alert and oriented x 3, gait ataxia, resting tremor noted in hands.  Psych: Normal affect. Extremities: No clubbing or cyanosis.

## 2011-11-20 NOTE — Telephone Encounter (Signed)
Left Message for pt to follow up with primary care.

## 2011-11-20 NOTE — Assessment & Plan Note (Signed)
Michaela [name] has watery diarrhea and nausea.  She has lost 8 lbs and is not eating much. I am going to decrease her Lasix to 20 mg daily (and KCl to 10 mEq daily).

## 2011-11-20 NOTE — Assessment & Plan Note (Signed)
Mitral valve repair with Alfieri technique.  On echo while in the hospital, there was normal LV systolic function and no mitral regurgitation.  There appeared to be mild to moderate mitral stenosis but I question how much of this was due to a high flow state post-operatively.  I will repeat an echo in a month or two to reassess the mitral valve.  

## 2011-11-20 NOTE — Patient Instructions (Signed)
Decrease lasix(furosemide) to 20mg  daily.  Your physician recommends that you have lab work today---BMET 424.0  Your physician recommends that you schedule a follow-up appointment in: 2 weeks with Dr Shirlee Latch.

## 2011-11-20 NOTE — Assessment & Plan Note (Signed)
Finishing course of levofloxacin for possible PNA.

## 2011-11-21 ENCOUNTER — Ambulatory Visit: Payer: Medicare Other | Admitting: Cardiology

## 2011-11-21 ENCOUNTER — Emergency Department (HOSPITAL_COMMUNITY): Payer: Medicare Other

## 2011-11-21 ENCOUNTER — Encounter (HOSPITAL_COMMUNITY): Payer: Self-pay | Admitting: Emergency Medicine

## 2011-11-21 ENCOUNTER — Telehealth: Payer: Self-pay | Admitting: Cardiology

## 2011-11-21 ENCOUNTER — Inpatient Hospital Stay (HOSPITAL_COMMUNITY)
Admission: EM | Admit: 2011-11-21 | Discharge: 2011-12-04 | DRG: 871 | Disposition: A | Discharge status: Other Acute Inpatient Hospital | Payer: Medicare Other | Attending: Internal Medicine | Admitting: Internal Medicine

## 2011-11-21 DIAGNOSIS — R279 Unspecified lack of coordination: Secondary | ICD-10-CM | POA: Diagnosis present

## 2011-11-21 DIAGNOSIS — IMO0002 Reserved for concepts with insufficient information to code with codable children: Secondary | ICD-10-CM

## 2011-11-21 DIAGNOSIS — R634 Abnormal weight loss: Secondary | ICD-10-CM

## 2011-11-21 DIAGNOSIS — J189 Pneumonia, unspecified organism: Secondary | ICD-10-CM

## 2011-11-21 DIAGNOSIS — R6520 Severe sepsis without septic shock: Secondary | ICD-10-CM | POA: Diagnosis present

## 2011-11-21 DIAGNOSIS — D720 Genetic anomalies of leukocytes: Secondary | ICD-10-CM | POA: Diagnosis not present

## 2011-11-21 DIAGNOSIS — A419 Sepsis, unspecified organism: Secondary | ICD-10-CM | POA: Diagnosis not present

## 2011-11-21 DIAGNOSIS — I4891 Unspecified atrial fibrillation: Secondary | ICD-10-CM | POA: Diagnosis not present

## 2011-11-21 DIAGNOSIS — Z9889 Other specified postprocedural states: Secondary | ICD-10-CM

## 2011-11-21 DIAGNOSIS — K5289 Other specified noninfective gastroenteritis and colitis: Secondary | ICD-10-CM | POA: Diagnosis present

## 2011-11-21 DIAGNOSIS — D649 Anemia, unspecified: Secondary | ICD-10-CM | POA: Diagnosis not present

## 2011-11-21 DIAGNOSIS — R630 Anorexia: Secondary | ICD-10-CM | POA: Diagnosis not present

## 2011-11-21 DIAGNOSIS — I959 Hypotension, unspecified: Secondary | ICD-10-CM | POA: Diagnosis not present

## 2011-11-21 DIAGNOSIS — N9489 Other specified conditions associated with female genital organs and menstrual cycle: Secondary | ICD-10-CM | POA: Diagnosis not present

## 2011-11-21 DIAGNOSIS — R000 Tachycardia, unspecified: Secondary | ICD-10-CM | POA: Diagnosis present

## 2011-11-21 DIAGNOSIS — Z7901 Long term (current) use of anticoagulants: Secondary | ICD-10-CM

## 2011-11-21 DIAGNOSIS — J984 Other disorders of lung: Secondary | ICD-10-CM | POA: Diagnosis not present

## 2011-11-21 DIAGNOSIS — D72829 Elevated white blood cell count, unspecified: Secondary | ICD-10-CM | POA: Diagnosis not present

## 2011-11-21 DIAGNOSIS — A0472 Enterocolitis due to Clostridium difficile, not specified as recurrent: Secondary | ICD-10-CM | POA: Diagnosis not present

## 2011-11-21 DIAGNOSIS — Z006 Encounter for examination for normal comparison and control in clinical research program: Secondary | ICD-10-CM | POA: Diagnosis not present

## 2011-11-21 DIAGNOSIS — G709 Myoneural disorder, unspecified: Secondary | ICD-10-CM | POA: Diagnosis present

## 2011-11-21 DIAGNOSIS — G9340 Encephalopathy, unspecified: Secondary | ICD-10-CM | POA: Diagnosis not present

## 2011-11-21 DIAGNOSIS — I340 Nonrheumatic mitral (valve) insufficiency: Secondary | ICD-10-CM | POA: Diagnosis present

## 2011-11-21 DIAGNOSIS — Z23 Encounter for immunization: Secondary | ICD-10-CM

## 2011-11-21 DIAGNOSIS — D696 Thrombocytopenia, unspecified: Secondary | ICD-10-CM | POA: Diagnosis present

## 2011-11-21 DIAGNOSIS — J690 Pneumonitis due to inhalation of food and vomit: Secondary | ICD-10-CM | POA: Diagnosis not present

## 2011-11-21 DIAGNOSIS — R197 Diarrhea, unspecified: Secondary | ICD-10-CM

## 2011-11-21 DIAGNOSIS — D509 Iron deficiency anemia, unspecified: Secondary | ICD-10-CM | POA: Diagnosis present

## 2011-11-21 DIAGNOSIS — R791 Abnormal coagulation profile: Secondary | ICD-10-CM | POA: Diagnosis present

## 2011-11-21 DIAGNOSIS — R079 Chest pain, unspecified: Secondary | ICD-10-CM | POA: Diagnosis not present

## 2011-11-21 DIAGNOSIS — T45515A Adverse effect of anticoagulants, initial encounter: Secondary | ICD-10-CM | POA: Diagnosis present

## 2011-11-21 DIAGNOSIS — R188 Other ascites: Secondary | ICD-10-CM | POA: Diagnosis not present

## 2011-11-21 DIAGNOSIS — R6510 Systemic inflammatory response syndrome (SIRS) of non-infectious origin without acute organ dysfunction: Secondary | ICD-10-CM

## 2011-11-21 DIAGNOSIS — R609 Edema, unspecified: Secondary | ICD-10-CM | POA: Diagnosis not present

## 2011-11-21 DIAGNOSIS — J9600 Acute respiratory failure, unspecified whether with hypoxia or hypercapnia: Secondary | ICD-10-CM

## 2011-11-21 DIAGNOSIS — I509 Heart failure, unspecified: Secondary | ICD-10-CM | POA: Diagnosis present

## 2011-11-21 DIAGNOSIS — R109 Unspecified abdominal pain: Secondary | ICD-10-CM | POA: Diagnosis not present

## 2011-11-21 DIAGNOSIS — E43 Unspecified severe protein-calorie malnutrition: Secondary | ICD-10-CM | POA: Diagnosis present

## 2011-11-21 DIAGNOSIS — C719 Malignant neoplasm of brain, unspecified: Secondary | ICD-10-CM | POA: Diagnosis not present

## 2011-11-21 DIAGNOSIS — I059 Rheumatic mitral valve disease, unspecified: Secondary | ICD-10-CM | POA: Diagnosis present

## 2011-11-21 DIAGNOSIS — R143 Flatulence: Secondary | ICD-10-CM | POA: Diagnosis not present

## 2011-11-21 DIAGNOSIS — R918 Other nonspecific abnormal finding of lung field: Secondary | ICD-10-CM | POA: Diagnosis not present

## 2011-11-21 DIAGNOSIS — I5032 Chronic diastolic (congestive) heart failure: Secondary | ICD-10-CM | POA: Diagnosis present

## 2011-11-21 DIAGNOSIS — E871 Hypo-osmolality and hyponatremia: Secondary | ICD-10-CM | POA: Diagnosis present

## 2011-11-21 DIAGNOSIS — R141 Gas pain: Secondary | ICD-10-CM | POA: Diagnosis not present

## 2011-11-21 DIAGNOSIS — I10 Essential (primary) hypertension: Secondary | ICD-10-CM | POA: Diagnosis not present

## 2011-11-21 DIAGNOSIS — J90 Pleural effusion, not elsewhere classified: Secondary | ICD-10-CM | POA: Diagnosis not present

## 2011-11-21 DIAGNOSIS — R6521 Severe sepsis with septic shock: Secondary | ICD-10-CM | POA: Diagnosis present

## 2011-11-21 DIAGNOSIS — B999 Unspecified infectious disease: Secondary | ICD-10-CM | POA: Diagnosis not present

## 2011-11-21 DIAGNOSIS — J811 Chronic pulmonary edema: Secondary | ICD-10-CM | POA: Diagnosis not present

## 2011-11-21 DIAGNOSIS — J9819 Other pulmonary collapse: Secondary | ICD-10-CM | POA: Diagnosis not present

## 2011-11-21 DIAGNOSIS — I341 Nonrheumatic mitral (valve) prolapse: Secondary | ICD-10-CM | POA: Diagnosis present

## 2011-11-21 DIAGNOSIS — Z87891 Personal history of nicotine dependence: Secondary | ICD-10-CM | POA: Diagnosis not present

## 2011-11-21 DIAGNOSIS — I369 Nonrheumatic tricuspid valve disorder, unspecified: Secondary | ICD-10-CM | POA: Diagnosis not present

## 2011-11-21 DIAGNOSIS — Z09 Encounter for follow-up examination after completed treatment for conditions other than malignant neoplasm: Secondary | ICD-10-CM | POA: Diagnosis not present

## 2011-11-21 LAB — COMPREHENSIVE METABOLIC PANEL
ALT: 17 U/L (ref 0–35)
AST: 25 U/L (ref 0–37)
Albumin: 1.6 g/dL — ABNORMAL LOW (ref 3.5–5.2)
CO2: 23 mEq/L (ref 19–32)
Chloride: 95 mEq/L — ABNORMAL LOW (ref 96–112)
Creatinine, Ser: 0.45 mg/dL — ABNORMAL LOW (ref 0.50–1.10)
GFR calc non Af Amer: >90 mL/min (ref >90)
Sodium: 128 mEq/L — ABNORMAL LOW (ref 135–145)
Total Bilirubin: 0.2 mg/dL — ABNORMAL LOW (ref 0.3–1.2)

## 2011-11-21 LAB — CBC
MCV: 83.9 fL (ref 78.0–100.0)
Platelets: 80 10*3/uL — ABNORMAL LOW (ref 150–400)
RBC: 4.03 MIL/uL (ref 3.87–5.11)
RDW: 16.6 % — ABNORMAL HIGH (ref 11.5–15.5)
WBC: 16.4 10*3/uL — ABNORMAL HIGH (ref 4.0–10.5)

## 2011-11-21 LAB — URINALYSIS, ROUTINE W REFLEX MICROSCOPIC
Protein, ur: NEGATIVE mg/dL
Specific Gravity, Urine: 1.019 (ref 1.005–1.030)
Urobilinogen, UA: 0.2 mg/dL (ref 0.0–1.0)

## 2011-11-21 LAB — OCCULT BLOOD, POC DEVICE: Fecal Occult Bld: POSITIVE

## 2011-11-21 LAB — URINE MICROSCOPIC-ADD ON

## 2011-11-21 LAB — PROCALCITONIN: Procalcitonin: 1.27 ng/mL

## 2011-11-21 LAB — CLOSTRIDIUM DIFFICILE BY PCR: Toxigenic C. Difficile by PCR: POSITIVE — AB

## 2011-11-21 MED ORDER — ADENOSINE 6 MG/2ML IV SOLN
INTRAVENOUS | Status: AC
  Administered 2011-11-21: 6 mg via INTRAVENOUS
  Filled 2011-11-21: qty 2

## 2011-11-21 MED ORDER — METRONIDAZOLE IN NACL 5-0.79 MG/ML-% IV SOLN
500.0000 mg | Freq: Three times a day (TID) | INTRAVENOUS | Status: DC
  Administered 2011-11-21 – 2011-11-25 (×11): 500 mg via INTRAVENOUS
  Filled 2011-11-21 (×13): qty 100

## 2011-11-21 MED ORDER — SODIUM CHLORIDE 0.9 % IV BOLUS (SEPSIS)
1000.0000 mL | Freq: Once | INTRAVENOUS | Status: AC
  Administered 2011-11-21: 1000 mL via INTRAVENOUS

## 2011-11-21 MED ORDER — SOMATROPIN 10 MG/1.5ML ~~LOC~~ SOLN
0.5000 mg | Freq: Every evening | SUBCUTANEOUS | Status: DC
  Filled 2011-11-21 (×2): qty 0.08

## 2011-11-21 MED ORDER — AMIODARONE IV BOLUS ONLY 150 MG/100ML: 150.0000 mg | Freq: Once | INTRAVENOUS | Status: DC

## 2011-11-21 MED ORDER — SODIUM CHLORIDE 0.9 % IV SOLN
INTRAVENOUS | Status: DC
  Administered 2011-11-21 – 2011-11-22 (×4): via INTRAVENOUS
  Administered 2011-11-23: 100 mL via INTRAVENOUS
  Administered 2011-11-23 – 2011-12-03 (×3): via INTRAVENOUS

## 2011-11-21 MED ORDER — SODIUM CHLORIDE 0.9 % IV BOLUS (SEPSIS)
500.0000 mL | Freq: Once | INTRAVENOUS | Status: AC
  Administered 2011-11-21: 500 mL via INTRAVENOUS

## 2011-11-21 MED ORDER — ENOXAPARIN SODIUM 40 MG/0.4ML ~~LOC~~ SOLN
40.0000 mg | SUBCUTANEOUS | Status: DC
  Filled 2011-11-21: qty 0.4

## 2011-11-21 MED ORDER — VANCOMYCIN 50 MG/ML ORAL SOLUTION
125.0000 mg | Freq: Four times a day (QID) | ORAL | Status: DC
  Administered 2011-11-21 – 2011-11-25 (×13): 125 mg via ORAL
  Filled 2011-11-21 (×20): qty 2.5

## 2011-11-21 MED ORDER — ACETAMINOPHEN 325 MG PO TABS: 650.0000 mg | ORAL_TABLET | Freq: Four times a day (QID) | ORAL | Status: DC | PRN

## 2011-11-21 MED ORDER — ONDANSETRON HCL 4 MG/2ML IJ SOLN: 4.0000 mg | Freq: Four times a day (QID) | INTRAMUSCULAR | Status: DC | PRN

## 2011-11-21 MED ORDER — AMIODARONE HCL IN DEXTROSE 360-4.14 MG/200ML-% IV SOLN
0.5000 mg/min | INTRAVENOUS | Status: DC
  Administered 2011-11-22 – 2011-11-25 (×6): 0.5 mg/min via INTRAVENOUS
  Filled 2011-11-21 (×16): qty 200

## 2011-11-21 MED ORDER — AMIODARONE IV BOLUS ONLY 150 MG/100ML
150.0000 mg | Freq: Once | INTRAVENOUS | Status: AC
  Administered 2011-11-21: 150 mg via INTRAVENOUS

## 2011-11-21 MED ORDER — SIMVASTATIN 40 MG PO TABS
40.0000 mg | ORAL_TABLET | Freq: Every day | ORAL | Status: DC
  Filled 2011-11-21: qty 1

## 2011-11-21 MED ORDER — METRONIDAZOLE IN NACL 5-0.79 MG/ML-% IV SOLN
500.0000 mg | Freq: Once | INTRAVENOUS | Status: AC
  Administered 2011-11-21: 500 mg via INTRAVENOUS
  Filled 2011-11-21: qty 100

## 2011-11-21 MED ORDER — POLYSACCHARIDE IRON 150 MG PO CAPS
150.0000 mg | ORAL_CAPSULE | Freq: Every day | ORAL | Status: DC
  Administered 2011-11-22 – 2011-11-24 (×3): 150 mg via ORAL
  Filled 2011-11-21 (×7): qty 1

## 2011-11-21 MED ORDER — ONDANSETRON HCL 4 MG/2ML IJ SOLN
4.0000 mg | Freq: Once | INTRAMUSCULAR | Status: AC
  Administered 2011-11-21: 4 mg via INTRAVENOUS
  Filled 2011-11-21: qty 2

## 2011-11-21 MED ORDER — SACCHAROMYCES BOULARDII 250 MG PO CAPS
250.0000 mg | ORAL_CAPSULE | Freq: Two times a day (BID) | ORAL | Status: DC
  Administered 2011-11-21 – 2011-11-24 (×7): 250 mg via ORAL
  Filled 2011-11-21 (×9): qty 1

## 2011-11-21 MED ORDER — SODIUM CHLORIDE 0.9 % IJ SOLN
3.0000 mL | Freq: Two times a day (BID) | INTRAMUSCULAR | Status: DC
  Administered 2011-11-22 – 2011-11-24 (×3): 3 mL via INTRAVENOUS
  Administered 2011-11-25: 17:00:00 via INTRAVENOUS
  Administered 2011-11-26 – 2011-11-30 (×8): 3 mL via INTRAVENOUS
  Administered 2011-12-01: 21:00:00 via INTRAVENOUS
  Administered 2011-12-01 – 2011-12-02 (×2): 3 mL via INTRAVENOUS

## 2011-11-21 MED ORDER — IMIPRAMINE HCL 25 MG PO TABS
25.0000 mg | ORAL_TABLET | Freq: Every day | ORAL | Status: DC
  Administered 2011-11-23 – 2011-11-24 (×3): 25 mg via ORAL
  Filled 2011-11-21 (×5): qty 1

## 2011-11-21 MED ORDER — OXYCODONE HCL 5 MG PO TABS
5.0000 mg | ORAL_TABLET | ORAL | Status: DC | PRN
  Administered 2011-11-25: 5 mg via ORAL
  Filled 2011-11-21: qty 1

## 2011-11-21 MED ORDER — METOPROLOL TARTRATE 1 MG/ML IV SOLN
INTRAVENOUS | Status: AC
  Administered 2011-11-21: 5 mg via INTRAVENOUS
  Filled 2011-11-21: qty 5

## 2011-11-21 MED ORDER — ACETAMINOPHEN 650 MG RE SUPP: 650.0000 mg | Freq: Four times a day (QID) | RECTAL | Status: DC | PRN

## 2011-11-21 MED ORDER — AMIODARONE HCL IN DEXTROSE 360-4.14 MG/200ML-% IV SOLN
1.0000 mg/min | INTRAVENOUS | Status: AC
  Administered 2011-11-21 – 2011-11-22 (×2): 1 mg/min via INTRAVENOUS
  Filled 2011-11-21: qty 200

## 2011-11-21 MED ORDER — FOLIC ACID 1 MG PO TABS
1.0000 mg | ORAL_TABLET | Freq: Every day | ORAL | Status: DC
  Administered 2011-11-22 – 2011-11-24 (×3): 1 mg via ORAL
  Filled 2011-11-21 (×4): qty 1

## 2011-11-21 MED ORDER — FENTANYL CITRATE 0.05 MG/ML IJ SOLN
25.0000 ug | Freq: Once | INTRAMUSCULAR | Status: AC
  Administered 2011-11-21: 25 ug via INTRAVENOUS
  Filled 2011-11-21: qty 2

## 2011-11-21 MED ORDER — ADENOSINE 6 MG/2ML IV SOLN
6.0000 mg | Freq: Once | INTRAVENOUS | Status: AC
  Administered 2011-11-21: 6 mg via INTRAVENOUS

## 2011-11-21 MED ORDER — TOLTERODINE TARTRATE ER 4 MG PO CP24
4.0000 mg | ORAL_CAPSULE | Freq: Every day | ORAL | Status: DC
  Administered 2011-11-22 – 2011-11-24 (×3): 4 mg via ORAL
  Filled 2011-11-21 (×4): qty 1

## 2011-11-21 MED ORDER — POTASSIUM CHLORIDE CRYS ER 20 MEQ PO TBCR
20.0000 meq | EXTENDED_RELEASE_TABLET | Freq: Two times a day (BID) | ORAL | Status: DC
  Administered 2011-11-21 – 2011-11-24 (×7): 20 meq via ORAL
  Filled 2011-11-21 (×9): qty 1

## 2011-11-21 MED ORDER — SODIUM CHLORIDE 0.9 % IV SOLN
Freq: Once | INTRAVENOUS | Status: AC
  Administered 2011-11-21: 19:00:00 via INTRAVENOUS

## 2011-11-21 MED ORDER — ALUM & MAG HYDROXIDE-SIMETH 200-200-20 MG/5ML PO SUSP
30.0000 mL | Freq: Four times a day (QID) | ORAL | Status: DC | PRN
  Administered 2011-12-04: 30 mL via ORAL
  Filled 2011-11-21: qty 30

## 2011-11-21 MED ORDER — METOPROLOL TARTRATE 1 MG/ML IV SOLN
2.5000 mg | Freq: Once | INTRAVENOUS | Status: AC
  Administered 2011-11-21: 5 mg via INTRAVENOUS

## 2011-11-21 MED ORDER — VANCOMYCIN 50 MG/ML ORAL SOLUTION: 500.0000 mg | Freq: Four times a day (QID) | ORAL | Status: DC

## 2011-11-21 MED ORDER — VANCOMYCIN 50 MG/ML ORAL SOLUTION
125.0000 mg | Freq: Once | ORAL | Status: AC
  Administered 2011-11-21: 125 mg via ORAL
  Filled 2011-11-21: qty 2.5

## 2011-11-21 MED ORDER — AMIODARONE HCL IN DEXTROSE 360-4.14 MG/200ML-% IV SOLN
INTRAVENOUS | Status: AC
  Administered 2011-11-22: 1 mg/min
  Filled 2011-11-21: qty 200

## 2011-11-21 MED ORDER — TRAMADOL HCL 50 MG PO TABS
50.0000 mg | ORAL_TABLET | Freq: Four times a day (QID) | ORAL | Status: DC | PRN
  Filled 2011-11-21: qty 1

## 2011-11-21 MED ORDER — ONDANSETRON HCL 4 MG PO TABS: 4.0000 mg | ORAL_TABLET | Freq: Four times a day (QID) | ORAL | Status: DC | PRN

## 2011-11-21 NOTE — H&P (Signed)
Michaela [name] MRN: [id number] DOB/AGE: 05-29-1956 56 y.o. 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 Patient was subsequently discharged to [facility] postop she developed shortness of breath found to have a large pleural effusion underwent thoracentesis and diuretic treatment with resolution of her dyspnea. Patient was subsequently placed on Levaquin on 11/13/2011 due to a rise in the white count and right-sided infiltrate on chest x-ray. Patient presents to the ED from [facility] nursing [facility] with a 5 to seven-day history of copious amounts of nonbloody black mild old gross diarrhea approximately 4-5 loose stools per day with some altered mental status. Patient's sister is at bedside and gave most of the history. Patient says that patient stated that she's been having these copious amounts of diarrhea over the past 5-7 days which have worsened over the past 3 days. Patient has had low-grade fevers with temps of 100 over the past 3 days with dye diffuse abdominal pain and abdominal distention. Patient also does complain of some generalized weakness lethargic and at 10 pound weight loss over this past week. Patient denies any chills, no cough, no constipation, no headaches. No shortness of breath. Patient does endorse some chest tightness over the past 2 days that have been intermittent in nature nonradiating with no associated bowel dictations or associated shortness of breath. Patient's last chest pain occurred on the morning of admission. Patient is currently chest pain-free. Patient sister  also noted that patient was confused on morning of admission. Patient was seen in the ED a C. difficile PCR which was done was positive. Patient was also noted to be hypotensive with systolic blood pressures in the 80s and low 90s and has received 4 L of normal saline. CMET which was done did show a sodium of 128 chloride of 95 albumin of 1.6 protein of 4.8. Pro calcitonin levels was 1.27. Lactic acid level was 1.8. CBC had a white count of 16.4 hemoglobin of 11.1 and a platelet count of 80. We were called to admit the patient for further evaluation and management.  Past Medical History  Diagnosis Date  . 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 MVP (mitral valve prolapse)   . Mitral regurgitation   . Shortness of breath   . Heart murmur   . Neuromuscular disorder     numbness in hands and feet  . Arthritis   . Overactive bladder   . Pleural effusion, left 11/11/2011    Past Surgical History  Procedure Date  . Ventriculoperitoneal shunt 1971  . Knee arthroscopy     right  . Cardiac catheterization     Jan 2013  . Transesophageal echocardiogram 7/12  . US echocardiography 6/12  . Mitral valve repair 10/15/2011    Procedure: MINIMALLY INVASIVE MITRAL VALVE REPAIR (MVR);  Surgeon: Purcell Nails, MD;  Location: [facility] OR;  Service: Open Heart Surgery;  Laterality: Right;    Prior to Admission medications   Medication Sig Start Date End Date Taking? Authorizing Provider  folic acid (FOLVITE) 1 MG tablet Take 1 mg by mouth daily. 10/27/11 10/26/12 Yes Wilmon Pali, PA  imipramine (TOFRANIL) 25 MG tablet Take 25 mg  by mouth at bedtime.    Yes Historical Provider, MD  levofloxacin (LEVAQUIN) 500 MG tablet Take 500 mg by mouth daily. For 14 days Started 3/6   Yes Historical Provider, MD  loperamide (IMODIUM) 2 MG capsule Take 2 mg by mouth 4 (four) times daily as needed. After loose stools   Yes Historical Provider, MD  metoprolol tartrate (LOPRESSOR) 12.5 mg TABS Take 12.5 mg  by mouth 2 (two) times daily. 11/13/11  Yes [name], MD  metroNIDAZOLE (FLAGYL) 500 MG tablet Take 500 mg by mouth 3 (three) times daily.   Yes Historical Provider, MD  polysaccharide iron (NIFEREX) 150 MG CAPS capsule Take 1 capsule (150 mg total) by mouth daily. 10/27/11  Yes Wilmon Pali, PA  potassium chloride SA (K-DUR,KLOR-CON) 20 MEQ tablet Take 20 mEq by mouth 2 (two) times daily.   Yes Historical Provider, MD  pravastatin (PRAVACHOL) 80 MG tablet Take 80 mg by mouth daily.   Yes Historical Provider, MD  Somatropin (NORDITROPIN FLEXPRO) 10 MG/1.5ML SOLN Inject 0.5 mg into the skin every evening.   Yes Historical Provider, MD  tolterodine (DETROL LA) 4 MG 24 hr capsule Take 4 mg by mouth daily.    Yes Historical Provider, MD  traMADol (ULTRAM) 50 MG tablet Take 50 mg by mouth every 6 (six) hours as needed. For pain.   Yes Historical Provider, MD    Allergies:  Allergies  Allergen Reactions  . Compazine Other (See Comments)    "eyes get buggy"    No family history on file.  Social History:  reports that she quit smoking about 21 years ago. She does not have any smokeless tobacco history on file. She reports that she drinks alcohol. She reports that she does not use illicit drugs.  ROS: All systems reviewed with the patient and was positive as per HPI otherwise all other systems are negative.  PHYSICAL EXAM: Blood pressure 88/46, pulse 112, temperature 100.6 F (38.1 C), temperature source Rectal, resp. rate 16, SpO2 96.00%. General: Frail looking, cachectic laying on a gurney in no acute cardiopulmonary distress. Alert, awake, oriented x3, in no acute distress. HEENT: Normocephalic atraumatic. Pupils equal round and reactive to light and accommodation. Extraocular movements intact. Oropharynx is clear, no lesions, no exudates. Neck is supple no lymphadenopathy. Extremely dry mucous membranes. No bruits, no goiter. Heart: Tachycardic, without murmurs, rubs, gallops. Lungs:  Clear to auscultation bilaterally. Abdomen: Soft, mildly diffuse TTP, distended, positive bowel sounds. Extremities: No clubbing cyanosis or edema with positive pedal pulses. Neuro: Alert and onto x3. Cranial nerves II through XII are grossly intact. Gait not tested secondary to safety.    EKG: NONE  Recent Results (from the past 240 hour(s))  CLOSTRIDIUM DIFFICILE BY PCR     Status: Abnormal   Collection Time   11/21/11  4:06 PM      Component Value Range Status Comment   C difficile by pcr POSITIVE (*) NEGATIVE  Final      Lab results:  Basename 11/21/11 1327 11/20/11 0952  NA 128* 132*  K 3.5 3.9  CL 95* 96  CO2 23 27  GLUCOSE 127* 116*  BUN 16 22  CREATININE 0.45* 0.9  CALCIUM 7.6* 8.2*  MG -- --  PHOS -- --    Basename 11/21/11 1327  AST 25  ALT 17  ALKPHOS 103  BILITOT 0.2*  PROT 4.8*  ALBUMIN 1.6*   No results found for this basename: LIPASE:2,AMYLASE:2 in the last 72 hours  Basename  11/21/11 1327  WBC 16.4*  NEUTROABS --  HGB 11.1*  HCT 33.8*  MCV 83.9  PLT 80*    Basename 11/21/11 1309  CKTOTAL --  CKMB --  CKMBINDEX --  TROPONINI <0.30   No components found with this basename: POCBNP:3 No results found for this basename: DDIMER in the last 72 hours No results found for this basename: HGBA1C:2 in the last 72 hours No results found for this basename: CHOL:2,HDL:2,LDLCALC:2,TRIG:2,CHOLHDL:2,LDLDIRECT:2 in the last 72 hours No results found for this basename: TSH,T4TOTAL,FREET3,T3FREE,THYROIDAB in the last 72 hours No results found for this basename: VITAMINB12:2,FOLATE:2,FERRITIN:2,TIBC:2,IRON:2,RETICCTPCT:2 in the last 72 hours Imaging results:  Dg Chest 2 View  11/21/2011  *RADIOLOGY REPORT*  Clinical Data: Chest pain  CHEST - 2 VIEW  Comparison: November 11, 2011  Findings: A left pleural effusion has decreased slightly.  The cardiac silhouette, mediastinum, pulmonary vasculature are within normal limits.  Previously seen right mid lung nodularity  is not appreciated today.  Shunt tubing projects over the right hemithorax.  Mitral valve prosthesis is again noted.  IMPRESSION: There has been interval decrease in the size of the small left pleural effusions since the prior study.  Original Report Authenticated By: Brandon Melnick, M.D.   Dg Chest 2 View  11/11/2011  *RADIOLOGY REPORT*  Clinical Data: Post thoracentesis, follow-up  CHEST - 2 VIEW  Comparison: Chest x-ray of 11/08/2011  Findings: Much of the left pleural effusion has been removed after left thoracentesis.  No pneumothorax is seen.  There is basilar linear atelectasis present.  A nodular opacity was noted on 10/28/2011 in the right mid lung zone but was not seen on prior CT of the chest of 01/15.  Therefore this may represent atelectasis or possibly pneumonia and follow-up is recommended.  Cardiomegaly is stable.  Mitral valve replacement is noted.  IMPRESSION:  1.  Decrease in volume of left pleural effusion after left thoracentesis.  No pneumothorax. 2.  Nodular opacity in the right mid lung probably represents atelectasis and/or pneumonia.  Recommend follow-up.  Original Report Authenticated By: Juline Patch, M.D.   Dg Chest 2 View  11/08/2011  *RADIOLOGY REPORT*  Clinical Data: History of mitral valve surgery on 10/15/2011, some shortness of breath  CHEST - 2 VIEW  Comparison: Chest x-ray of 10/28/2011  Findings: There has been a significant increase in volume of the left pleural effusion with volume loss on the left.  Mild atelectasis at the right lung base is noted.  Cardiomegaly is stable.  Mitral valve replacement is noted.  IMPRESSION: Increase in volume of left effusion with a moderate to large left effusion now present.  Original Report Authenticated By: Juline Patch, M.D.   Dg Chest 2 View  10/28/2011  *RADIOLOGY REPORT*  Clinical Data: Shortness of breath  CHEST - 2 VIEW  Comparison: Most recent 10/26/2011  Findings: Cardiomegaly. Status post minimally invasive MVR. Bilateral  pleural effusions, left greater than right, have increased from 10/26/2011, with increasing left base atelectasis. Mild right midlung zone scarring.  Mild interstitial prominence. Superimposed CHF not excluded.  IMPRESSION: Postoperative changes as described.  Left basilar atelectasis and increasing left greater than right effusions; superimposed CHF may be present.  Original Report Authenticated By: Elsie Stain, M.D.   Dg Chest 2 View  10/26/2011  *RADIOLOGY REPORT*  Clinical Data: Right lung opacity  CHEST - 2 VIEW  Comparison: 10/24/2011  Findings: Coarse airspace opacities in the right upper lobe, superior segment left lower lobe, and left infrahilar region  as before.  Small bilateral pleural effusions persist.  Coarse perihilar and bibasilar interstitial markings.  Heart size normal. Previous valve surgery.  VP shunt tubing is partially visualized.  IMPRESSION:  1.  Stable small bilateral pleural effusions and coarse airspace opacities bilaterally as above.  Original Report Authenticated By: Osa Craver, M.D.   Dg Chest 2 View  10/24/2011  *RADIOLOGY REPORT*  Clinical Data: Postop for open heart surgery.  Cough and congestion.  Brain tumor.  Mitral valve prolapse.  CHEST - 2 VIEW  Comparison: 10/22/2011  Findings: Midline trachea.  Right-sided VP shunt catheter. Normal heart size and mediastinal contours.  Small left pleural effusion persists.  No pneumothorax.  Improved bilateral aeration.  Persistent right greater than left upper lobe airspace disease.  Mild left base volume loss.  Surgical changes about the mitral valve.  IMPRESSION:  1.  Improved aeration with persistent upper lobe airspace disease remaining. 2.  Similar small left pleural effusion.  Original Report Authenticated By: Consuello Bossier, M.D.   Impression/Plan:  Principal Problem:  *Sepsis Active Problems:  Mitral regurgitation due to cusp prolapse  S/P mitral valve repair  Brain tumor, astrocytoma  Neuromuscular  disorder  Diastolic CHF, chronic  Diarrhea  Hypotension  Encephalopathy acute  Hyponatremia  Dehydration  Leukocytosis  Chest pain  Anemia   #1 sepsis Secondary to C. difficile colitis. Patient is tachycardic, has a fever, white count greater than 15,000, C. difficile PCR is positive. Patient's blood pressure has ranged in the high 80s to low 90s. Will check blood cultures x2. Lactic acid level is 1.8. Pro calcitonin level is 1.27. Will admit patient to the step down unit. Will hydrate with IV fluids. We'll place on oral vancomycin and IV metronidazole. Patient does have a distended abdomen and a such will get x-rays of her abdomen to rule out megacolon. Will monitor and follow.  #2 hypotension Likely secondary to problem #1 and dehydration. Patient has received 4 L of IV fluids however systolic blood pressures are still in the high 80s to low 90s. Will continue IV fluid resuscitation and monitor. Blood cultures are pending. Urine cultures are pending. If blood pressure still is not responding to fluids will need to consult with PCCM for central line placement in consultation.  #3 C. difficile colitis Patient was recently treated with antibiotics for Pseudomonas UTI and pulmonary infiltrate. C. difficile PCR is positive. Will check blood cultures x2. Get x-rays of the abdomen to rule out megacolon as patient does have a distended abdomen with some abdominal pain. Due to patient's hypotension and white count greater than 15,000 will place on oral vancomycin and IV metronidazole. We'll monitor and follow. May consider a vancomycin retention enema if condition worsens.  #4 acute encephalopathy Likely secondary to problems #1,2 and 3. Patient with improvement in her mentation. Patient is alert and oriented x3. Patient is able to tell me which country the new [place] is from. Continue IV fluids and antibiotics. And follow.  #5 hyponatremia Secondary to volume depletion. Will check a urine sodium and  urine creatinine. Continue IV fluids. Follow.  #6 dehydration Secondary to GI losses. IV fluids.  #7 chest pain We'll cycle cardiac enzymes every 8 hours x3. Will check a EKG. If cardiac enzymes are abnormal or EKG is abnormal we'll consult with cardiology for further evaluation and management.  #8 anemia/ thrombocytopenia No overt GI bleed. Thrombocytopenia may be likely secondary to sepsis. We'll check a peripheral smear. Check LDH and check a haptoglobin.  Patient is on iron supplements. We'll check an anemia panel. Will follow H&H. Continue iron supplements.  #9 status post mitral valve repair/diastolic CHF Will hold patient's Lopressor and simvastatin and Lasix.  #10 astrocytoma status post resection Stable. Continue home dose somewhat troponin.  #11 prophylaxis SCDs for DVT prophylaxis.   Michaela [name] 11/21/2011, 8:16 PM

## 2011-11-21 NOTE — ED Notes (Signed)
Per pt sister, pt has lost over 10lbs in 8 days.  Pt sister states she has "fired" pts md for not cooperating and admitting pt.  Pt sister wants hospitalist to admit pt and find out if she has C-diff.  Pt sister is speakin gfor pt bc pt not feeling well enough to speak for self.  Mitralvalve repair 2/5 Cornelius Moras MD.  Cardiologist [facility]. 22g IV in left hand that is not capped and will need to be removed.

## 2011-11-21 NOTE — ED Notes (Signed)
Per lab, blood work was missed but will be ran now

## 2011-11-21 NOTE — ED Notes (Signed)
Pt brother in law sitting with patient

## 2011-11-21 NOTE — ED Notes (Signed)
Pt coming from [facility].  Per EMS pt had mitro valve repair on 10/15/11.  Pt md request pt be screened for cdiff as she has had diarrhea for two days.  Pt was given IV fluids at MD office yesterday .  Has 22 in rt forearm IV

## 2011-11-21 NOTE — ED Notes (Signed)
Pt back from XR. Cardiac monitor on

## 2011-11-21 NOTE — Telephone Encounter (Signed)
FYI- Patient sister Selena Batten   Patient is currently at [facility] w/ CDef and chest stress.

## 2011-11-21 NOTE — Progress Notes (Signed)
ANTIBIOTIC CONSULT NOTE - INITIAL  Pharmacy Consult for Vancomycin Indication: C.diff.  Allergies  Allergen Reactions  . Compazine Other (See Comments)    "eyes get buggy"    Patient Measurements:   Adjusted Body Weight:   Vital Signs: Temp: 100.6 F (38.1 C) (03/14 1351) Temp src: Rectal (03/14 1351) BP: 88/46 mmHg (03/14 1900) Pulse Rate: 112  (03/14 1900) Intake/Output from previous day:   Intake/Output from this shift:    Labs:  Basename 11/21/11 1327 11/20/11 0952  WBC 16.4* --  HGB 11.1* --  PLT 80* --  LABCREA -- --  CREATININE 0.45* 0.9   The CrCl is unknown because both a height and weight (above a minimum accepted value) are required for this calculation. No results found for this basename: VANCOTROUGH:2,VANCOPEAK:2,VANCORANDOM:2,GENTTROUGH:2,GENTPEAK:2,GENTRANDOM:2,TOBRATROUGH:2,TOBRAPEAK:2,TOBRARND:2,AMIKACINPEAK:2,AMIKACINTROU:2,AMIKACIN:2, in the last 72 hours   Microbiology: Recent Results (from the past 720 hour(s))  URINE CULTURE     Status: Normal   Collection Time   10/24/11  9:59 PM      Component Value Range Status Comment   Specimen Description URINE, CATHETERIZED   Final    Special Requests NONE   Final    Culture  Setup Time [id number]   Final    Colony Count 60,000 COLONIES/ML   Final    Culture PSEUDOMONAS AERUGINOSA   Final    Report Status 10/26/2011 FINAL   Final    Organism ID, Bacteria PSEUDOMONAS AERUGINOSA   Final   CULTURE, BLOOD (ROUTINE X 2)     Status: Normal   Collection Time   10/25/11  9:40 AM      Component Value Range Status Comment   Specimen Description BLOOD LEFT ARM   Final    Special Requests BOTTLES DRAWN AEROBIC ONLY 10CC   Final    Culture  Setup Time [id number]   Final    Culture NO GROWTH 5 DAYS   Final    Report Status 10/31/2011 FINAL   Final   CULTURE, BLOOD (ROUTINE X 2)     Status: Normal   Collection Time   10/25/11  9:50 AM      Component Value Range Status Comment   Specimen Description BLOOD  LEFT HAND   Final    Special Requests BOTTLES DRAWN AEROBIC ONLY 10CC   Final    Culture  Setup Time [id number]   Final    Culture NO GROWTH 5 DAYS   Final    Report Status 10/31/2011 FINAL   Final   CULTURE, SPUTUM-ASSESSMENT     Status: Normal   Collection Time   10/25/11  4:14 PM      Component Value Range Status Comment   Specimen Description SPUTUM   Final    Special Requests Normal   Final    Sputum evaluation     Final    Value: THIS SPECIMEN IS ACCEPTABLE. RESPIRATORY CULTURE REPORT TO FOLLOW.   Report Status 10/25/2011 FINAL   Final   CULTURE, RESPIRATORY     Status: Normal   Collection Time   10/25/11  4:14 PM      Component Value Range Status Comment   Specimen Description SPUTUM   Final    Special Requests NONE   Final    Gram Stain     Final    Value: NO WBC SEEN     NO SQUAMOUS EPITHELIAL CELLS SEEN     NO ORGANISMS SEEN   Culture NORMAL OROPHARYNGEAL FLORA   Final    Report Status 10/28/2011 FINAL  Final   BODY FLUID CULTURE     Status: Normal   Collection Time   11/11/11  1:00 PM      Component Value Range Status Comment   GRAM STAIN Rare   Final    GRAM STAIN WBC present-predominately Mononuclear   Final    GRAM STAIN No Organisms Seen   Final    Organism ID, Bacteria NO GROWTH 3 DAYS   Final   CLOSTRIDIUM DIFFICILE BY PCR     Status: Abnormal   Collection Time   11/21/11  4:06 PM      Component Value Range Status Comment   C difficile by pcr POSITIVE (*) NEGATIVE  Final     Medical History: Past Medical History  Diagnosis Date  . 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 MVP (mitral valve prolapse)   . Mitral regurgitation   . Shortness of breath   . Heart murmur   . Neuromuscular disorder     numbness in hands and feet  . Arthritis   . Overactive bladder   . Pleural effusion, left 11/11/2011    Medications:  Scheduled:    . sodium chloride   Intravenous Once  . fentaNYL  25 mcg Intravenous Once  . metronidazole  500 mg  Intravenous Once  . metronidazole  500 mg Intravenous Q8H  . ondansetron  4 mg Intravenous Once  . sodium chloride  1,000 mL Intravenous Once  . sodium chloride  1,000 mL Intravenous Once  . sodium chloride  1,000 mL Intravenous Once  . sodium chloride  1,000 mL Intravenous Once  . vancomycin  125 mg Oral Once  . DISCONTD: vancomycin  500 mg Oral Q6H   Assessment: 56 yr old female with recent mitral valve repair presented to ED with diarrhea, weight loss, and chest pain. She had received 2 doses of IV Flagyl at her SNF.  Goal of Therapy:  successful treatment of C.diff.  Plan:  Vancomycin 125 mg PO q6 hrs for severe episode of C.diff. Continue treatment for 14 days if pt tests positive for C.dif.  Eugene Garnet 11/21/2011,8:29 PM

## 2011-11-21 NOTE — ED Notes (Signed)
[id number] Ready 

## 2011-11-21 NOTE — ED Provider Notes (Signed)
History     CSN: [id number]  Arrival date & time 11/21/11  1147   None     Chief Complaint  Patient presents with  . Diarrhea    (Consider location/radiation/quality/duration/timing/severity/associated sxs/prior treatment) HPI  Patient presents to the emergency department diarrhea, failure to thrive, weight loss, chest pain. The patient had mitral valve repair on 10/15/2011 and no complications. For the past 2 days the patient has had diffuse diarrhea without gross blood and, according to caregiver, has lost a significant amount of weight. Patient comes from [facility] facility where she was seen by her doctor last night given IV fluids and Flagyl in the p.m. and IV Flagyl again this morning. Dr. send patient here to have test done for C. Difficile. Upon entering the room the patient looks frail and weak. He should is at baseline wheelchair-bound but is unable to do activities of daily living at this time. The patient has a temperature of 100 pulse of 120, pressure 96/56 on arrival.  Past Medical History  Diagnosis Date  . 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 MVP (mitral valve prolapse)   . Mitral regurgitation   . Shortness of breath   . Heart murmur   . Neuromuscular disorder     numbness in hands and feet  . Arthritis   . Overactive bladder   . Pleural effusion, left 11/11/2011    Past Surgical History  Procedure Date  . Ventriculoperitoneal shunt 1971  . Knee arthroscopy     right  . Cardiac catheterization     Jan 2013  . Transesophageal echocardiogram 7/12  . US echocardiography 6/12  . Mitral valve repair 10/15/2011    Procedure: MINIMALLY INVASIVE MITRAL VALVE REPAIR (MVR);  Surgeon: Purcell Nails, MD;  Location: [facility] OR;  Service: Open Heart Surgery;  Laterality: Right;    No family history on file.  History  Substance Use Topics  . Smoking status: Former Smoker    Quit date: 09/09/1990  . Smokeless tobacco: Not on file  .  Alcohol Use: Yes    OB History    Grav Para Term Preterm Abortions TAB SAB Ect Mult Living                  Review of Systems  All other systems reviewed and are negative.    Allergies  Compazine  Home Medications   Current Outpatient Rx  Name Route Sig Dispense Refill  . FOLIC ACID 1 MG PO TABS Oral Take 1 mg by mouth daily.    . IMIPRAMINE HCL 25 MG PO TABS Oral Take 25 mg by mouth at bedtime.     Marland Kitchen LEVOFLOXACIN 500 MG PO TABS Oral Take 500 mg by mouth daily. For 14 days Started 3/6    . LOPERAMIDE HCL 2 MG PO CAPS Oral Take 2 mg by mouth 4 (four) times daily as needed. After loose stools    . METOPROLOL TARTRATE 12.5 MG HALF TABLET Oral Take 12.5 mg by mouth 2 (two) times daily.    Marland Kitchen METRONIDAZOLE 500 MG PO TABS Oral Take 500 mg by mouth 3 (three) times daily.    Marland Kitchen POLYSACCHARIDE IRON 150 MG PO CAPS Oral Take 1 capsule (150 mg total) by mouth daily. 30 each 0  . POTASSIUM CHLORIDE CRYS ER 20 MEQ PO TBCR Oral Take 20 mEq by mouth 2 (two) times daily.    Marland Kitchen PRAVASTATIN SODIUM 80 MG PO TABS Oral Take 80 mg  by mouth daily.    . SOMATROPIN 10 MG/1.5ML [place] SOLN Subcutaneous Inject 0.5 mg into the skin every evening.    . TOLTERODINE TARTRATE ER 4 MG PO CP24 Oral Take 4 mg by mouth daily.     . TRAMADOL HCL 50 MG PO TABS Oral Take 50 mg by mouth every 6 (six) hours as needed. For pain.      BP 93/49  Pulse 111  Temp(Src) 100.6 F (38.1 C) (Rectal)  Resp 16  SpO2 98%  Physical Exam  Constitutional: She appears well-developed. No distress.       Pt is emaciated and appears very weak  HENT:  Head: Normocephalic and atraumatic.  Eyes: Conjunctivae are normal. Pupils are equal, round, and reactive to light.  Neck: Trachea normal, normal range of motion and full passive range of motion without pain. Neck supple.  Cardiovascular: Normal rate, regular rhythm and normal pulses.   Pulmonary/Chest: Effort normal and breath sounds normal. Chest wall is not dull to percussion. She  exhibits no tenderness, no crepitus, no edema, no deformity and no retraction.  Abdominal: Soft. Normal appearance and bowel sounds are normal. She exhibits no distension and no mass. There is tenderness (patient has diffuse tenderness to palpation). There is no rebound and no guarding.  Genitourinary: Guaiac positive stool (no gross blood per rectum).  Musculoskeletal: Normal range of motion.  Neurological: She is alert. She has normal strength.  Skin: Skin is warm, dry and intact. She is not diaphoretic.  Psychiatric: She has a normal mood and affect. Her speech is normal and behavior is normal. Judgment and thought content normal. Cognition and memory are normal.    ED Course  Procedures (including critical care time)  Labs Reviewed  CLOSTRIDIUM DIFFICILE BY PCR - Abnormal; Notable for the following:    C difficile by pcr POSITIVE (*)    All other components within normal limits  CBC - Abnormal; Notable for the following:    WBC 16.4 (*)    Hemoglobin 11.1 (*)    HCT 33.8 (*)    RDW 16.6 (*)    Platelets 80 (*) PLATELET COUNT CONFIRMED BY SMEAR   All other components within normal limits  COMPREHENSIVE METABOLIC PANEL - Abnormal; Notable for the following:    Sodium 128 (*)    Chloride 95 (*)    Glucose, Bld 127 (*)    Creatinine, Ser 0.45 (*)    Calcium 7.6 (*)    Total Protein 4.8 (*)    Albumin 1.6 (*)    Total Bilirubin 0.2 (*)    All other components within normal limits  URINALYSIS, ROUTINE W REFLEX MICROSCOPIC - Abnormal; Notable for the following:    Leukocytes, UA SMALL (*)    All other components within normal limits  URINE MICROSCOPIC-ADD ON - Abnormal; Notable for the following:    Squamous Epithelial / LPF FEW (*)    All other components within normal limits  TROPONIN I  LACTIC ACID, PLASMA  PROCALCITONIN  OCCULT BLOOD, POC DEVICE  OCCULT BLOOD, POC DEVICE  STOOL CULTURE  CULTURE, BLOOD (ROUTINE X 2)  CULTURE, BLOOD (ROUTINE X 2)  URINE CULTURE   Dg  Chest 2 View  11/21/2011  *RADIOLOGY REPORT*  Clinical Data: Chest pain  CHEST - 2 VIEW  Comparison: November 11, 2011  Findings: A left pleural effusion has decreased slightly.  The cardiac silhouette, mediastinum, pulmonary vasculature are within normal limits.  Previously seen right mid lung nodularity is not appreciated today.  Shunt tubing projects over the right hemithorax.  Mitral valve prosthesis is again noted.  IMPRESSION: There has been interval decrease in the size of the small left pleural effusions since the prior study.  Original Report Authenticated By: Brandon Melnick, M.D.     1. Diarrhea   2. Weight loss   3. Anorexia       MDM  I have spoken with the supervisors at [facility] who informs me that patient received IV fluids last night as well as stool cultures were done, including test for C Dif. She also informs me vpt received iv flagyl  Last  night and this morning.  Dr. Preston Fleeting has evaluated patient and has been involved in patient work-up and management during her stay. Due to the patient failure to thrive and vitals signs with rapid decrease in weight loss, I will admit the patient to [facility] and await stool cultures and have patient  Dr. Timothy Lasso has agreed to see patient in ED and will further evaluate for admission.    Dorthula Matas, PA 11/21/11 1727

## 2011-11-21 NOTE — Telephone Encounter (Signed)
I talked with pt's sister, Selena Batten.

## 2011-11-21 NOTE — Progress Notes (Signed)
eLink Physician-Brief Progress Note Patient Name: ZIYA COONROD DOB: [birth year] MRN: [id number]  Date of Service  11/21/2011   HPI/Events of Note   SVT >> appears afibn on adenosine   eICU Interventions  Lopressor 5 mg then amio bolus & start gtt   Intervention Category Major Interventions: Arrhythmia - evaluation and management  Arpi Diebold V. 11/21/2011, 11:03 PM

## 2011-11-21 NOTE — ED Provider Notes (Signed)
56 year old female who is status post cardiac surgery recently and has been at a nursing home for rehabilitation has been having problems with diarrhea, anorexia and a weight loss. This morning she was noted to have been confused. She arrived in the emergency department emaciated and with that tachycardia which has improved with IV saline. She is reported to have had a stool for C. difficile done at the nursing home but results are not immediately available. Septic workup is initiated, although at the moment no source of infection is obvious other than her diarrhea.  Dione Booze, MD 11/21/11 1355

## 2011-11-21 NOTE — ED Notes (Signed)
Per ems pt given flagyl this morning

## 2011-11-21 NOTE — Progress Notes (Signed)
eLink Physician-Brief Progress Note Patient Name: Michaela [name] DOB: 28-Aug-1956 MRN: [id number]  Date of Service  11/21/2011   HPI/Events of Note   High BNP, MV repair, LV fn nml, received 6L fluids  eICU Interventions  Will accept SBP 85 since lactate nml Foley for UO   Intervention Category Intermediate Interventions: Hypotension - evaluation and management  [name] V. 11/21/2011, 10:32 PM

## 2011-11-21 NOTE — ED Notes (Signed)
Received report assumed patient  Walking round completed.

## 2011-11-22 DIAGNOSIS — R197 Diarrhea, unspecified: Secondary | ICD-10-CM

## 2011-11-22 DIAGNOSIS — I059 Rheumatic mitral valve disease, unspecified: Secondary | ICD-10-CM

## 2011-11-22 DIAGNOSIS — I4891 Unspecified atrial fibrillation: Secondary | ICD-10-CM

## 2011-11-22 DIAGNOSIS — I959 Hypotension, unspecified: Secondary | ICD-10-CM

## 2011-11-22 DIAGNOSIS — A0472 Enterocolitis due to Clostridium difficile, not specified as recurrent: Secondary | ICD-10-CM

## 2011-11-22 LAB — CARDIAC PANEL(CRET KIN+CKTOT+MB+TROPI)
CK, MB: 1.8 ng/mL (ref 0.3–4.0)
Relative Index: INVALID (ref 0.0–2.5)
Total CK: 19 U/L (ref 7–177)
Total CK: 23 U/L (ref 7–177)
Troponin I: <0.3 ng/mL (ref <0.30)
Troponin I: <0.3 ng/mL (ref <0.30)

## 2011-11-22 LAB — DIFFERENTIAL
Basophils Relative: 0 % (ref 0–1)
Eosinophils Relative: 1 % (ref 0–5)
Lymphs Abs: 0.8 10*3/uL (ref 0.7–4.0)
Monocytes Absolute: 1.9 10*3/uL — ABNORMAL HIGH (ref 0.1–1.0)
Neutro Abs: 13 10*3/uL — ABNORMAL HIGH (ref 1.7–7.7)

## 2011-11-22 LAB — CBC
MCH: 27.7 pg (ref 26.0–34.0)
MCV: 85.1 fL (ref 78.0–100.0)
Platelets: 75 10*3/uL — ABNORMAL LOW (ref 150–400)
RBC: 3.68 MIL/uL — ABNORMAL LOW (ref 3.87–5.11)

## 2011-11-22 LAB — PROTIME-INR: INR: 4.61 — ABNORMAL HIGH (ref 0.00–1.49)

## 2011-11-22 LAB — COMPREHENSIVE METABOLIC PANEL
AST: 19 U/L (ref 0–37)
BUN: 11 mg/dL (ref 6–23)
CO2: 20 mEq/L (ref 19–32)
Chloride: 108 mEq/L (ref 96–112)
Creatinine, Ser: 0.51 mg/dL (ref 0.50–1.10)
GFR calc Af Amer: >90 mL/min (ref >90)
GFR calc non Af Amer: >90 mL/min (ref >90)
Glucose, Bld: 86 mg/dL (ref 70–99)
Total Bilirubin: 0.2 mg/dL — ABNORMAL LOW (ref 0.3–1.2)

## 2011-11-22 LAB — IRON AND TIBC
Iron: <10 ug/dL — ABNORMAL LOW (ref 42–135)
UIBC: 127 ug/dL (ref 125–400)

## 2011-11-22 LAB — LACTATE DEHYDROGENASE: LDH: 373 U/L — ABNORMAL HIGH (ref 94–250)

## 2011-11-22 LAB — CREATININE, URINE, RANDOM: Creatinine, Urine: 52.28 mg/dL

## 2011-11-22 LAB — FERRITIN: Ferritin: 886 ng/mL — ABNORMAL HIGH (ref 10–291)

## 2011-11-22 MED ORDER — WARFARIN - PHARMACIST DOSING INPATIENT: Freq: Every day | Status: DC

## 2011-11-22 MED ORDER — SODIUM CHLORIDE 0.9 % IV BOLUS (SEPSIS)
250.0000 mL | Freq: Once | INTRAVENOUS | Status: AC
  Administered 2011-11-22: 250 mL via INTRAVENOUS

## 2011-11-22 MED ORDER — PRAVASTATIN SODIUM 40 MG PO TABS
80.0000 mg | ORAL_TABLET | Freq: Every day | ORAL | Status: DC
  Administered 2011-11-22 – 2011-11-24 (×3): 80 mg via ORAL
  Filled 2011-11-22 (×4): qty 2

## 2011-11-22 MED ORDER — PNEUMOCOCCAL VAC POLYVALENT 25 MCG/0.5ML IJ INJ
0.5000 mL | INJECTION | INTRAMUSCULAR | Status: AC
  Administered 2011-11-23: 0.5 mL via INTRAMUSCULAR
  Filled 2011-11-22: qty 0.5

## 2011-11-22 MED ORDER — METOPROLOL TARTRATE 1 MG/ML IV SOLN
2.5000 mg | Freq: Four times a day (QID) | INTRAVENOUS | Status: DC
  Administered 2011-11-22 – 2011-11-25 (×10): 2.5 mg via INTRAVENOUS
  Filled 2011-11-22 (×13): qty 5

## 2011-11-22 NOTE — Progress Notes (Signed)
ANTICOAGULATION CONSULT NOTE - Initial Consult  Pharmacy Consult for Coumadin Indication: Afib/MV repair  Allergies  Allergen Reactions  . Compazine Other (See Comments)    "eyes get buggy"    Patient Measurements: Height: 4\' 11"  (149.9 cm) Weight: 93 lb 7.6 oz (42.4 kg) IBW/kg (Calculated) : 43.2   Vital Signs: Temp: 97.5 F (36.4 C) (03/15 0408) Temp src: Oral (03/15 0408) BP: 93/49 mmHg (03/15 0600) Pulse Rate: 59  (03/14 2230)  Labs:  Basename 11/22/11 0600 11/22/11 0048 11/21/11 2319 11/21/11 1327 11/21/11 1309 11/20/11 0952  HGB 10.2* -- -- 11.1* -- --  HCT 31.3* -- -- 33.8* -- --  PLT PENDING -- -- 80* -- --  APTT -- 51* -- -- -- --  LABPROT -- 44.2* -- -- -- --  INR -- 4.61* -- -- -- --  HEPARINUNFRC -- -- -- -- -- --  CREATININE 0.51 -- -- 0.45* -- 0.9  CKTOTAL 23 -- 19 -- -- --  CKMB 2.2 -- 1.8 -- -- --  TROPONINI <0.30 -- <0.30 -- <0.30 --   Estimated Creatinine Clearance: 54.4 ml/min (by C-G formula based on Cr of 0.51).  Medical History: Past Medical History  Diagnosis Date  . 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 MVP (mitral valve prolapse)   . Mitral regurgitation   . Shortness of breath   . Heart murmur   . Neuromuscular disorder     numbness in hands and feet  . Arthritis   . Overactive bladder   . Pleural effusion, left 11/11/2011    Medications:  Prescriptions prior to admission  Medication Sig Dispense Refill  . folic acid (FOLVITE) 1 MG tablet Take 1 mg by mouth daily.      Marland Kitchen imipramine (TOFRANIL) 25 MG tablet Take 25 mg by mouth at bedtime.       Marland Kitchen levofloxacin (LEVAQUIN) 500 MG tablet Take 500 mg by mouth daily. For 14 days Started 3/6      . loperamide (IMODIUM) 2 MG capsule Take 2 mg by mouth 4 (four) times daily as needed. After loose stools      . metoprolol tartrate (LOPRESSOR) 12.5 mg TABS Take 12.5 mg by mouth 2 (two) times daily.      . metroNIDAZOLE (FLAGYL) 500 MG tablet Take 500 mg by mouth 3 (three)  times daily.      . polysaccharide iron (NIFEREX) 150 MG CAPS capsule Take 1 capsule (150 mg total) by mouth daily.  30 each  0  . potassium chloride SA (K-DUR,KLOR-CON) 20 MEQ tablet Take 20 mEq by mouth 2 (two) times daily.      . pravastatin (PRAVACHOL) 80 MG tablet Take 80 mg by mouth daily.      . Somatropin (NORDITROPIN FLEXPRO) 10 MG/1.5ML SOLN Inject 0.5 mg into the skin every evening.      . tolterodine (DETROL LA) 4 MG 24 hr capsule Take 4 mg by mouth daily.       . traMADol (ULTRAM) 50 MG tablet Take 50 mg by mouth every 6 (six) hours as needed. For pain.       Scheduled:    . sodium chloride   Intravenous Once  . adenosine (ADENOCARD) IV  6-12 mg Intravenous Once  . amiodarone (NEXTERONE PREMIX) 360 mg/200 mL dextrose      . amiodarone  150 mg Intravenous Once  . amiodarone  150 mg Intravenous Once  . fentaNYL  25 mcg Intravenous Once  . folic  acid  1 mg Oral Daily  . imipramine  25 mg Oral QHS  . metoprolol  2.5-5 mg Intravenous Once  . metronidazole  500 mg Intravenous Once  . metronidazole  500 mg Intravenous Q8H  . ondansetron  4 mg Intravenous Once  . pneumococcal 23 valent vaccine  0.5 mL Intramuscular Tomorrow-1000  . polysaccharide iron  150 mg Oral Daily  . potassium chloride SA  20 mEq Oral BID  . saccharomyces boulardii  250 mg Oral BID  . simvastatin  40 mg Oral q1800  . sodium chloride  1,000 mL Intravenous Once  . sodium chloride  1,000 mL Intravenous Once  . sodium chloride  1,000 mL Intravenous Once  . sodium chloride  1,000 mL Intravenous Once  . sodium chloride  500 mL Intravenous Once  . sodium chloride  3 mL Intravenous Q12H  . Somatropin  0.5333 mg Subcutaneous QPM  . tolterodine  4 mg Oral Daily  . vancomycin  125 mg Oral Once  . vancomycin  125 mg Oral Q6H  . Warfarin - Pharmacist Dosing Inpatient   Does not apply q1800  . DISCONTD: enoxaparin  40 mg Subcutaneous Q24H  . DISCONTD: vancomycin  500 mg Oral Q6H   Infusions:    . sodium  chloride 75 mL/hr at 11/22/11 0600  . amiodarone (NEXTERONE PREMIX) 360 mg/200 mL dextrose 1 mg/min (11/22/11 0300)   Followed by  . amiodarone (NEXTERONE PREMIX) 360 mg/200 mL dextrose 0.5 mg/min (11/22/11 0744)    Assessment: 56yo female to resume Coumadin for Afib and MV repair with low goal per Dr Shirlee Latch; pt had been seen ~2d ago at [facility] with dark stools with a plan to hold Coumadin if H/H lowered; INR is supratherapeutic, possibly due to Flagyl.  Goal of Therapy:  INR 2-2.5   Plan:  Will hold Coumadin today and continue to monitor INR to resume and adjust dosing.  Home Coumadin dose was 2.5mg  daily.  Colleen Can PharmD BCPS 11/22/2011,7:44 AM

## 2011-11-22 NOTE — Progress Notes (Signed)
TCTS BRIEF PROGRESS NOTE   [name] is well known to me. Events noted. She seems to be doing much better. I will follow up on Monday, but please call if specific questions arise.  Michaela [name] H 11/22/2011 9:39 AM

## 2011-11-22 NOTE — Progress Notes (Addendum)
Pt resting comfortably at this time.  NSR noted on monitor at this time.  Will continue to monitor closely. Canary Brim, RN

## 2011-11-22 NOTE — Consult Note (Signed)
Name: Michaela [name] MRN: [id number] DOB: [birth year]  LOS: 1  CRITICAL CARE ADMISSION NOTE  History of Present Illness: This is a very pleasant 56 y/o unfortunate female who was admitted from a nursing home on 3/14 by [facility] for hypotension and c.diff diarrhea.  On admission she was found to have a BP in the 70's-80's.  We were consulted by [facility] because after arrival to the SDU her hypotension persisted despite aggressive IVF resuscitation and she developed SVT.  The SVT (a-fib with RVR) has been treated with rate control (metop) and amiodarone and the IVF has continued.  Since the episode of A-fib her heart rate has stabilized and her blood pressure has remained in the 80's systolic.  She notes that she felt quite a bit of belly tenderness yesterday with the diarrhea, but this is improving.  No cough, or pain elsewhere.  She states that her baseline blood pressure is 90's/40's.  Lines / Drains:   Cultures / Sepsis markers: 3/14 C.diff >> positive 3/14 urine >> 3/14 blood cx >>  Antibiotics: 3/14 oral vanc (c.diff) >> 3/14 flagyl IV (c.diff) >>  Tests / Events: 3/13 KUB: mild dilation of colon and small bowel, suspicious for ileus cannot rule out distal colonic obstruction     Past Medical History  Diagnosis Date  . 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 MVP (mitral valve prolapse)   . Mitral regurgitation   . Shortness of breath   . Heart murmur   . Neuromuscular disorder     numbness in hands and feet  . Arthritis   . Overactive bladder   . Pleural effusion, left 11/11/2011   Past Surgical History  Procedure Date  . Ventriculoperitoneal shunt 1971  . Knee arthroscopy     right  . Cardiac catheterization     Jan 2013  . Transesophageal echocardiogram 7/12  . US echocardiography 6/12  . Mitral valve repair 10/15/2011    Procedure: MINIMALLY INVASIVE MITRAL VALVE REPAIR (MVR);  Surgeon: Purcell Nails, MD;  Location: [facility] OR;  Service: Open Heart Surgery;   Laterality: Right;   Prior to Admission medications   Medication Sig Start Date End Date Taking? Authorizing Provider  folic acid (FOLVITE) 1 MG tablet Take 1 mg by mouth daily. 10/27/11 10/26/12 Yes Wilmon Pali, PA  imipramine (TOFRANIL) 25 MG tablet Take 25 mg by mouth at bedtime.    Yes Historical Provider, MD  levofloxacin (LEVAQUIN) 500 MG tablet Take 500 mg by mouth daily. For 14 days Started 3/6   Yes Historical Provider, MD  loperamide (IMODIUM) 2 MG capsule Take 2 mg by mouth 4 (four) times daily as needed. After loose stools   Yes Historical Provider, MD  metoprolol tartrate (LOPRESSOR) 12.5 mg TABS Take 12.5 mg by mouth 2 (two) times daily. 11/13/11  Yes [name], MD  metroNIDAZOLE (FLAGYL) 500 MG tablet Take 500 mg by mouth 3 (three) times daily.   Yes Historical Provider, MD  polysaccharide iron (NIFEREX) 150 MG CAPS capsule Take 1 capsule (150 mg total) by mouth daily. 10/27/11  Yes Wilmon Pali, PA  potassium chloride SA (K-DUR,KLOR-CON) 20 MEQ tablet Take 20 mEq by mouth 2 (two) times daily.   Yes Historical Provider, MD  pravastatin (PRAVACHOL) 80 MG tablet Take 80 mg by mouth daily.   Yes Historical Provider, MD  Somatropin (NORDITROPIN FLEXPRO) 10 MG/1.5ML SOLN Inject 0.5 mg into the skin every evening.   Yes Historical Provider, MD  tolterodine (DETROL LA) 4 MG 24 hr capsule Take 4 mg by mouth daily.    Yes Historical Provider, MD  traMADol (ULTRAM) 50 MG tablet Take 50 mg by mouth every 6 (six) hours as needed. For pain.   Yes Historical Provider, MD   Allergies  Allergen Reactions  . Compazine Other (See Comments)    "eyes get buggy"   No family history on file. Social History  reports that she quit smoking about 21 years ago. She does not have any smokeless tobacco history on file. She reports that she drinks alcohol. She reports that she does not use illicit drugs.  Review Of Systems   Gen: Notes fever, chills, denies weight change, notes fatigue, night  sweats HEENT: Denies blurred vision, double vision, hearing loss, tinnitus, sinus congestion, rhinorrhea, sore throat, neck stiffness, dysphagia PULM: Denies shortness of breath, cough, sputum production, hemoptysis, wheezing CV: Denies chest pain, edema, orthopnea, paroxysmal nocturnal dyspnea, palpitations GI: Notes some abdominal pain, denies nausea, vomiting, notes some diarrhea, denies hematochezia, melena, constipation, change in bowel habits GU: Denies dysuria, hematuria, polyuria, oliguria, urethral discharge Endocrine: Denies hot or cold intolerance, polyuria, polyphagia or appetite change Derm: Denies rash, dry skin, scaling or peeling skin change Heme: Denies easy bruising, bleeding, bleeding gums Neuro: Denies headache, numbness, weakness, slurred speech, loss of memory or consciousness  Vital Signs:   Filed Vitals:   11/22/11 0200 11/22/11 0230 11/22/11 0300 11/22/11 0408  BP: 85/48 88/50 87/42    Pulse:      Temp:    97.5 F (36.4 C)  TempSrc:    Oral  Resp: 12 11 11    Height:      Weight:      SpO2: 99% 99% 100%   UOP since arrival to 2900 about 60cc/hr  Physical Examination: Gen: well appearing, sleeping comfortably in no acute distress HEENT: healed scalp surgical scar noted, PERRL, EOMi, OP clear, MM dry Neck: supple without masses PULM: CTA B CV: RRR, no mgr, no JVD AB: BS+, soft, slighlty distended and diffuse mild tenderness noted, no guarding or rebound, no hsm Ext: warm, no edema, no clubbing, no cyanosis Derm: no rash or skin breakdown Neuro: Asleep but arouses to voice and oriented x4,  maew Psyche: Normal mood and affect  Labs and Imaging:    CBC    Component Value Date/Time   WBC 16.4* 11/21/2011 1327   RBC 4.03 11/21/2011 1327   HGB 11.1* 11/21/2011 1327   HCT 33.8* 11/21/2011 1327   PLT 80* 11/21/2011 1327   MCV 83.9 11/21/2011 1327   MCH 27.5 11/21/2011 1327   MCHC 32.8 11/21/2011 1327   RDW 16.6* 11/21/2011 1327   LYMPHSABS 2.0 09/12/2011 1056    MONOABS 0.6 09/12/2011 1056   EOSABS 0.1 09/12/2011 1056   BASOSABS 0.0 09/12/2011 1056    BMET    Component Value Date/Time   NA 128* 11/21/2011 1327   K 3.5 11/21/2011 1327   CL 95* 11/21/2011 1327   CO2 23 11/21/2011 1327   GLUCOSE 127* 11/21/2011 1327   BUN 16 11/21/2011 1327   CREATININE 0.45* 11/21/2011 1327   CREATININE 0.55 11/11/2011 1414   CALCIUM 7.6* 11/21/2011 1327   GFRNONAA >90 11/21/2011 1327   GFRAA >90 11/21/2011 1327      Assessment and Plan:  This is a very pleasant 56 y/o female with a past medical history of an astrocytoma, mitral valve replacement who was admitted on 3/14 with hypotension in the setting of c.diff diarrhea.  PCCM was called to help manage the hypotension.  Currently she is not in shock given her normal UOP, normal lactic acid, normal procalcitonin, normal mental status and SBP near baseline.  It sounds like the management by [facility] has been appropriate this evening with IVF resuscitation and amiodarone for the a-fib with rvr.    Sepsis (11/21/2011)   Assessment: initially septic by vital signs but now improving, source c.diff; high risk given initial hypotension   Plan:  -agree with po vanc/flagyl -see hypotension  Hypotension (11/21/2011)   Assessment: agree with IVF resuscitation as you have done.  She does not appear to need further aggressive resuscitation and the current rate of 75cc/hour of NS seems reasonable until her po intake picks up   Plan:  -continue IVF at low rate as you are doing for now -repeat bmet with AM labs -follow uop -goal SBP >85 given baseline low blood pressure -repeat lactic acid and assess UOP/mental status if concerned about persistent hypotension -no indication for pressors  A-fib with RVR (per notes, I have no EKG documenting this)   Assessment: likely due to sepsis, now in sinus rhythm after amiodarone   Plan: -agree with amio -would convert to po amio only if she goes back into a-fib; otherwise you could stop the IV  amiodarone today (3/15) if her overall status is improving.   PCCM will sign off, call if questions.   Best practices / Disposition: PCCM consulting   Heber Suffolk, M.D. Pulmonary and Critical Care Medicine [facility] Pager: [phone]  11/22/2011, 4:37 AM

## 2011-11-22 NOTE — Consult Note (Signed)
NAME:  Michaela [name], Michaela [name] NO.:  [id number]  MEDICAL RECORD NO.:  [id number]  LOCATION:  2919                         FACILITY:  [facility]  PHYSICIAN:  Natasha Bence, MD       DATE OF BIRTH:  [birth year]  DATE OF CONSULTATION:  11/21/2011 DATE OF DISCHARGE:                                CONSULTATION   REASON FOR CONSULTATION:  Patient with atrial fibrillation with rapid ventricular rate.  HISTORY OF PRESENT ILLNESS:  Michaela [name] is a 56 year old female with a history of mitral valve prolapse and severe mitral regurgitation who underwent mitral valve replacement, February.  Her hospital course was complicated by urinary tract infection which was treated with ciprofloxacin.  She was discharged to a nursing home postoperatively and did well from a cardiac standpoint, however, she did develop large pleural effusion and had thoracentesis.  She has also been treated for volume overload with diuretics and there was also question whether or not she had pneumonia and she was subsequently treated with Levaquin. Over the last several days, she has had multiple episodes of diarrhea daily.  She also reports lightheadedness and just general fatigue and feeling weak.  She also has had subjective fevers.  She has had some nausea, but no emesis.  She presented to the emergency department tonight with de-generalized complaints.  She received several liters of normal saline due to profound dehydration, hypotension.  During admission, she developed a tachycardia with ventricular rates near to 100s, she was administered adenosine which revealed that this was atrial fibrillation with rapid ventricular rate.  She was subsequently started on amiodarone infusion, currently she feels improved.  She denies any chest discomfort or palpitations.  She has no lightheadedness or dizziness currently.  She does complain of mild diffuse abdominal pain. Otherwise she has not had any PND or orthopnea.  Rest  of review of systems was negative.  PAST MEDICAL HISTORY: 1. Mitral valve prolapse with severe mitral regurgitation, status post     minimally invasive mitral valve repair. 2. Brain tumor.  She had an astrocytoma, treated with radiation. 3. 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 4. Arthritis.  PAST SURGICAL HISTORY:  She has had minimally invasive repair of her mitral valve in February.  She has a history of VP shunt in the 84s. She has had right knee surgery.  FAMILY HISTORY:  Noncontributory.  SOCIAL HISTORY:  She currently resides in [facility].  She is a former smoker, but quit over 20 years ago.  She does drink occasional alcohol.  She does not use illicit drugs.  HOME MEDICATIONS: 1. Folic acid 1 mg p.o. daily. 2. Clomipramine 25 mg at bedtime. 3. She was taking Levaquin 500 mg p.o. daily. 4. Imodium p.r.n. 5. Metoprolol 12.5 mg p.o. b.i.d. 6. Flagyl 500 mg p.o. b.i.d. 7. Niferex 1 capsule daily. 8. KCl 20 mEq twice daily. 9. Pravastatin 80 mg p.o. daily. 10.Somatropin 0.5 mg in the skin every evening. 11.Detrol 4 mg p.o. daily 12.Tramadol p.r.n.  ALLERGIES:  She is allergic to COMPAZINE.  PHYSICAL EXAMINATION:  VITAL SIGNS:  She is afebrile with temperature 97.6, pulse of 59 and regular, respiratory of 12, blood pressure 84/51, O2  sats 100% on room air. GENERAL:  She is a thin white female, in no apparent distress, chronically ill-appearing. HEENT:  Eyes, anicteric sclerae.  HEENT, mucous membranes are dry. LUNGS:  Clear to auscultation bilaterally. CARDIOVASCULAR:  Regular rate and rhythm.  No murmurs, rubs, or gallops. ABDOMEN:  Soft.  She had mild tenderness to palpation throughout her abdomen.  No rebound, tenderness, or guarding. EXTREMITIES:  Warm with no edema. NEUROLOGIC:  She is awake, alert, and oriented x3. SKIN:  No rashes or ulcers.  LABORATORY DATA:  Sodium was 128, potassium 3.5, chloride of 95, bicarbonate 23, BUN of 16,  creatinine 0.45, calcium is 7.6, glucose 127, albumin was 1.6, AST 16, ALT 17.  Lactic acid is 1.27.  Troponin was less than 0.3.  White blood cell count was 16.4, hematocrit was 34, platelet count was 80.  C. difficile was positive.  Chest x-ray showed interval decrease in the left pleural effusion.  IMPRESSION AND PLAN:  This is a 56 year old female with history of mitral valve prolapse and severe mitral regurgitation, who recently underwent mitral valve repair, who developed postoperative complications including UTI and possible pneumonia.  Subsequently, she developed Clostridium difficile colitis likely secondary to these antibiotic uses. She presented with dehydration, relative hypotension, although she runs relatively low blood pressures at baseline.  She developed rapid atrial fibrillation this evening.  She was subsequently started on amiodarone infusion by the primary team, and she converted back to sinus mechanism. Currently she is in sinus with rates in the 80s.  She has no complaints other than her abdominal complaints at this point.  Of note, on her echo, she did have at least moderate left atrial enlargement.  Her postoperative echo did not reveal any mitral regurgitation, but there might have been some degree of residual mitral stenosis in the setting of her Alfieri stitch.  Given the size of her left atrium chance of recurrence of atrial fibrillation are fairly high.  Will need to make some more long-term decisions about keeping her on antiarrhythmic therapy versus seeing how she would do just with beta blockade and avoiding triggers which in this case might have been severe dehydration due to her profound diarrhea.  Also will need to obtain an echocardiogram at some point to re-evaluate the gradient across her mitral valves.  Please call with any questions.          ______________________________ Natasha Bence, MD     MH/MEDQ  D:  11/22/2011  T:  11/22/2011  Job:   [id number]

## 2011-11-22 NOTE — Progress Notes (Signed)
Patient ID: Michaela [name], female   DOB: 08/07/1956, 56 y.o.   MRN: [id number]    SUBJECTIVE: Doing better this am.  Remains in NSR on IV amiodarone.  Mild abdominal pain.  BP stable.  Ongoing diarrhea.      . sodium chloride   Intravenous Once  . adenosine (ADENOCARD) IV  6-12 mg Intravenous Once  . amiodarone (NEXTERONE PREMIX) 360 mg/200 mL dextrose      . amiodarone  150 mg Intravenous Once  . amiodarone  150 mg Intravenous Once  . fentaNYL  25 mcg Intravenous Once  . folic acid  1 mg Oral Daily  . imipramine  25 mg Oral QHS  . metoprolol  2.5-5 mg Intravenous Once  . metronidazole  500 mg Intravenous Once  . metronidazole  500 mg Intravenous Q8H  . ondansetron  4 mg Intravenous Once  . pneumococcal 23 valent vaccine  0.5 mL Intramuscular Tomorrow-1000  . polysaccharide iron  150 mg Oral Daily  . potassium chloride SA  20 mEq Oral BID  . saccharomyces boulardii  250 mg Oral BID  . simvastatin  40 mg Oral q1800  . sodium chloride  1,000 mL Intravenous Once  . sodium chloride  1,000 mL Intravenous Once  . sodium chloride  1,000 mL Intravenous Once  . sodium chloride  1,000 mL Intravenous Once  . sodium chloride  500 mL Intravenous Once  . sodium chloride  3 mL Intravenous Q12H  . Somatropin  0.5333 mg Subcutaneous QPM  . tolterodine  4 mg Oral Daily  . vancomycin  125 mg Oral Once  . vancomycin  125 mg Oral Q6H  . DISCONTD: enoxaparin  40 mg Subcutaneous Q24H  . DISCONTD: vancomycin  500 mg Oral Q6H      Filed Vitals:   11/22/11 0430 11/22/11 0500 11/22/11 0530 11/22/11 0600  BP: 91/50 88/52 91/46  93/49  Pulse:      Temp:      TempSrc:      Resp: 16 13 14 15   Height:      Weight:  93 lb 7.6 oz (42.4 kg)    SpO2: 100% 99% 99% 100%    Intake/Output Summary (Last 24 hours) at 11/22/11 0726 Last data filed at 11/22/11 0600  Gross per 24 hour  Intake 1768.71 ml  Output    350 ml  Net 1418.71 ml    LABS: Basic Metabolic Panel:  Basename 11/22/11 0600  11/22/11 0048 11/21/11 1327  NA 137 -- 128*  K 3.5 -- 3.5  CL 108 -- 95*  CO2 20 -- 23  GLUCOSE 86 -- 127*  BUN 11 -- 16  CREATININE 0.51 -- 0.45*  CALCIUM 7.1* -- 7.6*  MG -- 1.7 --  PHOS -- -- --   Liver Function Tests:  Basename 11/22/11 0600 11/21/11 1327  AST 19 25  ALT 12 17  ALKPHOS 96 103  BILITOT 0.2* 0.2*  PROT 4.4* 4.8*  ALBUMIN 1.4* 1.6*   No results found for this basename: LIPASE:2,AMYLASE:2 in the last 72 hours CBC:  Basename 11/22/11 0600 11/21/11 1327  WBC PENDING 16.4*  NEUTROABS PENDING --  HGB 10.2* 11.1*  HCT 31.3* 33.8*  MCV 85.1 83.9  PLT PENDING 80*   Cardiac Enzymes:  Basename 11/22/11 0600 11/21/11 2319 11/21/11 1309  CKTOTAL 23 19 --  CKMB 2.2 1.8 --  CKMBINDEX -- -- --  TROPONINI <0.30 <0.30 <0.30   BNP: No components found with this basename: POCBNP:3 D-Dimer: No results found for  this basename: DDIMER:2 in the last 72 hours Hemoglobin A1C: No results found for this basename: HGBA1C in the last 72 hours Fasting Lipid Panel: No results found for this basename: CHOL,HDL,LDLCALC,TRIG,CHOLHDL,LDLDIRECT in the last 72 hours Thyroid Function Tests: No results found for this basename: TSH,T4TOTAL,FREET3,T3FREE,THYROIDAB in the last 72 hours Anemia Panel: No results found for this basename: VITAMINB12,FOLATE,FERRITIN,TIBC,IRON,RETICCTPCT in the last 72 hours  RADIOLOGY: Dg Chest 2 View  11/21/2011  *RADIOLOGY REPORT*  Clinical Data: Chest pain  CHEST - 2 VIEW  Comparison: November 11, 2011  Findings: A left pleural effusion has decreased slightly.  The cardiac silhouette, mediastinum, pulmonary vasculature are within normal limits.  Previously seen right mid lung nodularity is not appreciated today.  Shunt tubing projects over the right hemithorax.  Mitral valve prosthesis is again noted.  IMPRESSION: There has been interval decrease in the size of the small left pleural effusions since the prior study.  Original Report Authenticated By: Brandon Melnick, M.D.    PHYSICAL EXAM General: NAD, frail Neck: No JVD, no thyromegaly or thyroid nodule.  Lungs: Clear to auscultation bilaterally with normal respiratory effort. CV: Nondisplaced PMI.  Heart regular S1/S2, no S3/S4, no murmur.  No peripheral edema.  No carotid bruit.  Normal pedal pulses.  Abdomen: Soft, mild diffuse tenderness, no hepatosplenomegaly, no distention.  Neurologic: Alert and oriented x 3.  Psych: Normal affect. Extremities: No clubbing or cyanosis.   TELEMETRY: Reviewed telemetry pt in NSR.  Had afib with RVR last night.   ASSESSMENT AND PLAN:  56 yo with history of severe MR/MV repair was re-admitted from SNF with C.difficile colitis.  She went into atrial fibrillation with RVR last night.   1.  Atrial fibrillation: Now in NSR on amiodarone gtt.  Will continue gtt for now, transition po amiodarone later today.  Large LA so will have predisposition to atrial fibrillation at times of stress.  2.  C.difficile colitis:  On po vancomycin and IV Flagyl.  BP now stable.  Decrease NS to 50 cc/hr given small size.  3.  Mitral valve repair: Will continue coumadin as hemoglobin is stable.   [name] 11/22/2011 7:31 AM

## 2011-11-22 NOTE — Progress Notes (Addendum)
Dr. Vassie Loll notified to pt's increased hr Max 212 bpm and hypotension.  Pt denies dizziness or lightheadedness. Skin pink warm and dry at this time. Will continue to monitor closely. Canary Brim, RN

## 2011-11-22 NOTE — Progress Notes (Signed)
PA for cards notified of pt's HR up 150s after bath and taking meds..Will monitor closely.

## 2011-11-22 NOTE — Progress Notes (Signed)
[facility]   Subjective: States feels much better today although when asked if she felt her abdomen was distended she states it is. However, abdominal pain has greatly improved. She endorses thirst.  Objective: Vital signs in last 24 hours: Temp:  [97.5 F (36.4 C)-99.2 F (37.3 C)] 98.2 F (36.8 C) (03/15 1207) Pulse Rate:  [58-112] 89  (03/15 1207) Resp:  [10-27] 13  (03/15 1207) BP: (70-93)/(41-68) 91/54 mmHg (03/15 1207) SpO2:  [93 %-100 %] 100 % (03/15 1207) Weight:  [42.4 kg (93 lb 7.6 oz)] 42.4 kg (93 lb 7.6 oz) (03/15 0500) Weight change:  Last BM Date: 11/21/11  Intake/Output from previous day: 03/14 0701 - 03/15 0700 In: 1768.7 [I.V.:968.7; IV Piggyback:800] Out: 350 [Urine:350] Intake/Output this shift: Total I/O In: 713.5 [P.O.:530; I.V.:83.5; IV Piggyback:100] Out: -   General appearance: alert, cooperative, appears older than stated age and mild distress Resp: clear to auscultation bilaterally, 2 L nasal cannula oxygen saturations 100% Cardio: regular rate and rhythm, S1, S2 normal, no murmur, click, rub or gallop, systolic blood pressure averaging between 85-95 which is chronic for this patient; patient having bursts of atrial fibrillation with RVR with rates up to 150s during my examination, IV amiodarone infusing at 16.7 cc per hour GI: soft, minimal tender but nonfocal without guarding or rebounding; bowel sounds hypoactive; rectal Foley in place with dark brown bilious thin liquid returns in back no masses,  no organomegaly GU: Foley catheter to straight drain with dark orange to T.-colored urine and that said that Extremities: extremities normal, atraumatic, no cyanosis or edema Neurologic: At baseline for patient, no acute focal abnormalities, patient alert oriented x3  Lab Results:  Basename 11/22/11 0600 11/21/11 1327  WBC 15.9* 16.4*  HGB 10.2* 11.1*  HCT 31.3* 33.8*  PLT 75* 80*   BMET  Basename 11/22/11 0600 11/21/11 1327  NA 137  128*  K 3.5 3.5  CL 108 95*  CO2 20 23  GLUCOSE 86 127*  BUN 11 16  CREATININE 0.51 0.45*  CALCIUM 7.1* 7.6*    Studies/Results: Dg Chest 2 View  11/21/2011  *RADIOLOGY REPORT*  Clinical Data: Chest pain  CHEST - 2 VIEW  Comparison: November 11, 2011  Findings: A left pleural effusion has decreased slightly.  The cardiac silhouette, mediastinum, pulmonary vasculature are within normal limits.  Previously seen right mid lung nodularity is not appreciated today.  Shunt tubing projects over the right hemithorax.  Mitral valve prosthesis is again noted.  IMPRESSION: There has been interval decrease in the size of the small left pleural effusions since the prior study.  Original Report Authenticated By: Brandon Melnick, M.D.   Dg Abd 1 View  11/21/2011  *RADIOLOGY REPORT*  Clinical Data: Abdominal distention.  Abdominal pain and diarrhea.  ABDOMEN - 1 VIEW  Comparison: None.  Findings: Mild dilated colon is demonstrated as well as a few mildly dilated small bowel loops the left abdomen.  This is suspicious for an ileus, although a distal colonic obstruction cannot definitely be excluded.  VP shunt tubing is seen within the abdomen and pelvis.  IMPRESSION: Mild dilatation of colon and small bowel, suspicious for ileus, although a distal colonic obstruction cannot be excluded. Recommend clinical correlation, and consider CT for further evaluation.  Original Report Authenticated By: Danae Orleans, M.D.    Medications:  I have reviewed the patient's current medications. Scheduled:   . sodium chloride   Intravenous Once  . adenosine (ADENOCARD) IV  6-12 mg Intravenous Once  .  amiodarone (NEXTERONE PREMIX) 360 mg/200 mL dextrose      . amiodarone  150 mg Intravenous Once  . amiodarone  150 mg Intravenous Once  . folic acid  1 mg Oral Daily  . imipramine  25 mg Oral QHS  . metoprolol  2.5 mg Intravenous Q6H  . metoprolol  2.5-5 mg Intravenous Once  . metronidazole  500 mg Intravenous Once  .  metronidazole  500 mg Intravenous Q8H  . pneumococcal 23 valent vaccine  0.5 mL Intramuscular Tomorrow-1000  . polysaccharide iron  150 mg Oral Daily  . potassium chloride SA  20 mEq Oral BID  . pravastatin  80 mg Oral q1800  . saccharomyces boulardii  250 mg Oral BID  . sodium chloride  1,000 mL Intravenous Once  . sodium chloride  1,000 mL Intravenous Once  . sodium chloride  250 mL Intravenous Once  . sodium chloride  500 mL Intravenous Once  . sodium chloride  3 mL Intravenous Q12H  . Somatropin  0.5333 mg Subcutaneous QPM  . tolterodine  4 mg Oral Daily  . vancomycin  125 mg Oral Once  . vancomycin  125 mg Oral Q6H  . Warfarin - Pharmacist Dosing Inpatient   Does not apply q1800  . DISCONTD: enoxaparin  40 mg Subcutaneous Q24H  . DISCONTD: simvastatin  40 mg Oral q1800  . DISCONTD: vancomycin  500 mg Oral Q6H    Assessment/Plan:  Principal Problem:  *Sepsis (systemic inflammatory response syndrome) *Etiology secondary to acute C. difficile colitis *Urine remains dark and patient continues to have bursts of RVR suggestive of negative fluid balance so we'll continue to hydrate appropriately *Treat underlying infectious causes and followup on all cultures  Active Problems:  Diarrhea/ C. difficile colitis *Started on IV Flagyl and oral vancomycin this admission *Clear liquids only until colitis symptoms and diarrhea have improved   Hypotension/Dehydration/Leukocytosis *Has chronic hypertension with systolic ranging 16-10 *This parameter to determine volume status in this patient would be her heart rate which continues to have issues with bursts of tachycardia *We'll get a 250 cc normal saline bolus and increase IV fluids to 125 cc per hour *Continue to follow white count-subtle decreases since admission   Atrial fibrillation with RVR *Continues to have bursts of RVR with rates as high as 150-160 *Appreciate cardiology assistance-cardiology document that patient has a  dilated left atrium so will have predisposition to atrial fibrillation at times of stress *Plan was to attempt to transition to oral amiodarone later today  *Already anticoagulated due to mechanical mitral valve We have resumed Lopessor IV as well.    Diastolic CHF, chronic *Compensated at present but he cautious with volume resuscitation   Encephalopathy acute *Secondary to acute illness and has resolved   Hyponatremia *Secondary to dehydration and is now resolved    Chest pain *Has resolved, EKG nonischemic and cardiac isoenzymes negative x3 collection but we'll continue to monitor   Mitral regurgitation due to cusp prolapse/S/P mitral valve repair *Is on chronic Coumadin anticoagulation *Dr. Cornelius Moras with [facility] S. has seen this patient this admission and no acute problems identified  Warfarin coagulopathy *INR somewhat supratherapeutic at 4.64 today *Pharmacy managing Coumadin suspect we'll hold dosage tonight   Brain tumor, astrocytoma/Neuromuscular disorder *No acute issues   Anemia *Hemoglobin drifting down with hydration *Baseline hemoglobin 10.53 weeks ago one month ago she was as low as 8.0   Disposition *Remain in stepdown   LOS: 1 day   Junious Silk, ANP pager [phone]  [facility] [email or web address] Password: [email or web address]  11/22/2011, 2:15 PM  I have examined the patient, reviewed the chart and discussed the plan with Junious Silk, NP. I have modified the above note and agree with it.   Calvert Cantor, MD [phone]

## 2011-11-22 NOTE — ED Provider Notes (Signed)
Medical screening examination/treatment/procedure(s) were conducted as a shared visit with non-physician practitioner(s) and myself.  I personally evaluated the patient during the encounter  CRITICAL CARE Performed by: [email or web address]   Total critical care time: 45 minutes  Critical care time was exclusive of separately billable procedures and treating other patients.  Critical care was necessary to treat or prevent imminent or life-threatening deterioration.  Critical care was time spent personally by me on the following activities: development of treatment plan with patient and/or surrogate as well as nursing, discussions with consultants, evaluation of patient's response to treatment, examination of patient, obtaining history from patient or surrogate, ordering and performing treatments and interventions, ordering and review of laboratory studies, ordering and review of radiographic studies, pulse oximetry and re-evaluation of patient's condition.   Dione Booze, MD 11/22/11 [phone]

## 2011-11-23 ENCOUNTER — Inpatient Hospital Stay (HOSPITAL_COMMUNITY): Payer: Medicare Other

## 2011-11-23 DIAGNOSIS — I4891 Unspecified atrial fibrillation: Secondary | ICD-10-CM

## 2011-11-23 LAB — URINE CULTURE

## 2011-11-23 MED ORDER — SODIUM CHLORIDE 0.9 % IV SOLN
25.0000 mg | Freq: Once | INTRAVENOUS | Status: AC
  Administered 2011-11-23: 25 mg via INTRAVENOUS
  Filled 2011-11-23: qty 0.5

## 2011-11-23 MED ORDER — SIMETHICONE 80 MG PO CHEW
80.0000 mg | CHEWABLE_TABLET | Freq: Four times a day (QID) | ORAL | Status: DC
  Administered 2011-11-23 – 2011-11-24 (×8): 80 mg via ORAL
  Filled 2011-11-23 (×13): qty 1

## 2011-11-23 MED ORDER — SODIUM CHLORIDE 0.9 % IV SOLN
1000.0000 mg | Freq: Once | INTRAVENOUS | Status: AC
  Administered 2011-11-23: 1000 mg via INTRAVENOUS
  Filled 2011-11-23: qty 20

## 2011-11-23 MED ORDER — SENNOSIDES-DOCUSATE SODIUM 8.6-50 MG PO TABS: 2.0000 | ORAL_TABLET | Freq: Every day | ORAL | Status: DC

## 2011-11-23 MED ORDER — FAMOTIDINE 20 MG PO TABS
20.0000 mg | ORAL_TABLET | Freq: Two times a day (BID) | ORAL | Status: DC
  Administered 2011-11-23 – 2011-11-24 (×4): 20 mg via ORAL
  Filled 2011-11-23 (×7): qty 1

## 2011-11-23 NOTE — Progress Notes (Addendum)
[facility]   Subjective: She is trying to eat but states her belly is swollen today. She does not have any nausea and rectal pouch is still collecting dark watery stool. Urine in foley bag is still dark. Abdominal pain is controlled. I have discussed with her that her HR is back to NSR now. She expressed appreciation of our care.   Objective: Vital signs in last 24 hours: Temp:  [98 F (36.7 C)-100.6 F (38.1 C)] 98 F (36.7 C) (03/16 1200) Pulse Rate:  [106] 106  (03/15 1954) Resp:  [12-21] 17  (03/16 1200) BP: (96-107)/(44-69) 97/69 mmHg (03/16 1200) SpO2:  [97 %-100 %] 100 % (03/16 1200) FiO2 (%):  [2 %] 2 % (03/15 1954) Weight change:  Last BM Date: 11/22/11  Intake/Output from previous day: 03/15 0701 - 03/16 0700 In: 2644.1 [P.O.:1010; I.V.:1334.1; IV Piggyback:300] Out: 850 [Urine:750; Stool:100] Intake/Output this shift: Total I/O In: 216.7 [I.V.:116.7; IV Piggyback:100] Out: 195 [Urine:195]  General appearance: alert, cooperative, appears older than stated age and mild distress Resp: clear to auscultation bilaterally, 2 L nasal cannula oxygen saturations 100% Cardio: regular rate and rhythm, S1, S2 normal, no murmur, click, rub or gallop, systolic blood pressure averaging between 85-95 which is chronic for this patient; patient having bursts of atrial fibrillation with RVR with rates up to 150s during my examination, IV amiodarone infusing at 16.7 cc per hour GI: soft, distended, tympanic, minimal tender but nonfocal without guarding or rebounding; bowel sounds present but hypoactive; rectal Foley in place with dark brown bilious thin liquid returns in back no masses GU: Foley catheter to straight drain with dark orange to T.-colored urine  Extremities: extremities normal, atraumatic, no cyanosis or edema Neurologic: At baseline for patient, no acute focal abnormalities, patient alert oriented x3  Lab Results:  Basename 11/22/11 0600 11/21/11 1327  WBC 15.9*  16.4*  HGB 10.2* 11.1*  HCT 31.3* 33.8*  PLT 75* 80*   BMET  Basename 11/22/11 0600 11/21/11 1327  NA 137 128*  K 3.5 3.5  CL 108 95*  CO2 20 23  GLUCOSE 86 127*  BUN 11 16  CREATININE 0.51 0.45*  CALCIUM 7.1* 7.6*    Studies/Results: Dg Abd 1 View  11/21/2011  *RADIOLOGY REPORT*  Clinical Data: Abdominal distention.  Abdominal pain and diarrhea.  ABDOMEN - 1 VIEW  Comparison: None.  Findings: Mild dilated colon is demonstrated as well as a few mildly dilated small bowel loops the left abdomen.  This is suspicious for an ileus, although a distal colonic obstruction cannot definitely be excluded.  VP shunt tubing is seen within the abdomen and pelvis.  IMPRESSION: Mild dilatation of colon and small bowel, suspicious for ileus, although a distal colonic obstruction cannot be excluded. Recommend clinical correlation, and consider CT for further evaluation.  Original Report Authenticated By: Danae Orleans, M.D.    Medications:  I have reviewed the patient's current medications. Scheduled:    . amiodarone  150 mg Intravenous Once  . famotidine  20 mg Oral BID  . folic acid  1 mg Oral Daily  . imipramine  25 mg Oral QHS  . metoprolol  2.5 mg Intravenous Q6H  . metronidazole  500 mg Intravenous Q8H  . pneumococcal 23 valent vaccine  0.5 mL Intramuscular Tomorrow-1000  . polysaccharide iron  150 mg Oral Daily  . potassium chloride SA  20 mEq Oral BID  . pravastatin  80 mg Oral q1800  . saccharomyces boulardii  250 mg Oral BID  .  simethicone  80 mg Oral QID  . sodium chloride  3 mL Intravenous Q12H  . Somatropin  0.5333 mg Subcutaneous QPM  . tolterodine  4 mg Oral Daily  . vancomycin  125 mg Oral Q6H  . Warfarin - Pharmacist Dosing Inpatient   Does not apply q1800    Assessment/Plan:  Principal Problem:  *Sepsis (systemic inflammatory response syndrome) *Etiology secondary to acute C. difficile colitis *Urine remains dark signifying dehydration   Diarrhea/ C. difficile  colitis *Started on IV Flagyl and oral vancomycin this admission  Abdominal distension Likely secondary to c. Diff. Will get portable xray to check for ileus. Start Pepcid and Simethicone for gaseous distension.    Hypotension/Dehydration/Leukocytosis *Has chronic hypotension with systolic ranging 16-10 Cont fluids at current rate due to poor PO intake and ongoing watery diarrhea.    Atrial fibrillation with RVR Converted to NSR.  Cards will keep on IV Amio until colitis improves.  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 Cornelius Moras with [facility]. has seen this patient this admission and no acute problems identified  Warfarin coagulopathy *INR somewhat supratherapeutic at 4.64 today *Pharmacy managing Coumadin suspect we'll hold dosage tonight   Brain tumor, astrocytoma/Neuromuscular disorder *No acute issues   Anemia- Iron deficiency  *Hemoglobin drifting down with hydration *Baseline hemoglobin 10.53 weeks ago one month ago she was as low as 8.0 - will start IV iron. Hold off on checking stools for occult blood as the C diff colitis will make her stool positive as well.    Disposition *Remain in stepdown as long as she is on IV Amiodorone.    LOS: 2 days   11/23/2011, 3:27 PM  Calvert Cantor, MD [phone]

## 2011-11-23 NOTE — Progress Notes (Addendum)
MEDICATION RELATED CONSULT NOTE - INITIAL   Pharmacy Consult for IV iron Indication: iron deficiency anemia  Allergies  Allergen Reactions  . Compazine Other (See Comments)    "eyes get buggy"    Patient Measurements: Height: 4\' 11"  (149.9 cm) Weight: 93 lb 7.6 oz (42.4 kg) IBW/kg (Calculated) : 43.2  Adjusted Body Weight:   Vital Signs: Temp: 99.6 F (37.6 C) (03/16 1532) Temp src: Oral (03/16 1532) BP: 97/69 mmHg (03/16 1200) Intake/Output from previous day: 03/15 0701 - 03/16 0700 In: 2644.1 [P.O.:1010; I.V.:1334.1; IV Piggyback:300] Out: 850 [Urine:750; Stool:100] Intake/Output from this shift: Total I/O In: 933.4 [I.V.:833.4; IV Piggyback:100] Out: 195 [Urine:195]  Labs:  [facility] 11/22/11 0600 11/22/11 0048 11/21/11 2326 11/21/11 1327  WBC 15.9* -- -- 16.4*  HGB 10.2* -- -- 11.1*  HCT 31.3* -- -- 33.8*  PLT 75* -- -- 80*  APTT -- 51* -- --  CREATININE 0.51 -- -- 0.45*  LABCREA -- -- 52.28 --  CREATININE 0.51 -- -- 0.45*  CREAT24HRUR -- -- -- --  MG -- 1.7 -- --  PHOS -- -- -- --  ALBUMIN 1.4* -- -- 1.6*  PROT 4.4* -- -- 4.8*  ALBUMIN 1.4* -- -- 1.6*  AST 19 -- -- 25  ALT 12 -- -- 17  ALKPHOS 96 -- -- 103  BILITOT 0.2* -- -- 0.2*  BILIDIR -- -- -- --  IBILI -- -- -- --   Estimated Creatinine Clearance: 54.4 ml/min (by C-G formula based on Cr of 0.51).   Microbiology: Recent Results (from the past 720 hour(s))  URINE CULTURE     Status: Normal   Collection Time   10/24/11  9:59 PM      Component Value Range Status Comment   Specimen Description URINE, CATHETERIZED   Final    Special Requests NONE   Final    Culture  Setup Time [id number]   Final    Colony Count 60,000 COLONIES/ML   Final    Culture PSEUDOMONAS AERUGINOSA   Final    Report Status 10/26/2011 FINAL   Final    Organism ID, Bacteria PSEUDOMONAS AERUGINOSA   Final   CULTURE, BLOOD (ROUTINE X 2)     Status: Normal   Collection Time   10/25/11  9:40 AM      Component Value Range  Status Comment   Specimen Description BLOOD LEFT ARM   Final    Special Requests BOTTLES DRAWN AEROBIC ONLY 10CC   Final    Culture  Setup Time [id number]   Final    Culture NO GROWTH 5 DAYS   Final    Report Status 10/31/2011 FINAL   Final   CULTURE, BLOOD (ROUTINE X 2)     Status: Normal   Collection Time   10/25/11  9:50 AM      Component Value Range Status Comment   Specimen Description BLOOD LEFT HAND   Final    Special Requests BOTTLES DRAWN AEROBIC ONLY 10CC   Final    Culture  Setup Time [id number]   Final    Culture NO GROWTH 5 DAYS   Final    Report Status 10/31/2011 FINAL   Final   CULTURE, SPUTUM-ASSESSMENT     Status: Normal   Collection Time   10/25/11  4:14 PM      Component Value Range Status Comment   Specimen Description SPUTUM   Final    Special Requests Normal   Final    Sputum evaluation  Final    Value: THIS SPECIMEN IS ACCEPTABLE. RESPIRATORY CULTURE REPORT TO FOLLOW.   Report Status 10/25/2011 FINAL   Final   CULTURE, RESPIRATORY     Status: Normal   Collection Time   10/25/11  4:14 PM      Component Value Range Status Comment   Specimen Description SPUTUM   Final    Special Requests NONE   Final    Gram Stain     Final    Value: NO WBC SEEN     NO SQUAMOUS EPITHELIAL CELLS SEEN     NO ORGANISMS SEEN   Culture NORMAL OROPHARYNGEAL FLORA   Final    Report Status 10/28/2011 FINAL   Final   BODY FLUID CULTURE     Status: Normal   Collection Time   11/11/11  1:00 PM      Component Value Range Status Comment   GRAM STAIN Rare   Final    GRAM STAIN WBC present-predominately Mononuclear   Final    GRAM STAIN No Organisms Seen   Final    Organism ID, Bacteria NO GROWTH 3 DAYS   Final   CULTURE, BLOOD (ROUTINE X 2)     Status: Normal (Preliminary result)   Collection Time   11/21/11  1:00 PM      Component Value Range Status Comment   Specimen Description BLOOD HAND RIGHT   Final    Special Requests BOTTLES DRAWN AEROBIC AND ANAEROBIC 5CC   Final     Culture  Setup Time [id number]   Final    Culture     Final    Value:        BLOOD CULTURE RECEIVED NO GROWTH TO DATE CULTURE WILL BE HELD FOR 5 DAYS BEFORE ISSUING A FINAL NEGATIVE REPORT   Report Status PENDING   Incomplete   CULTURE, BLOOD (ROUTINE X 2)     Status: Normal (Preliminary result)   Collection Time   11/21/11  1:09 PM      Component Value Range Status Comment   Specimen Description BLOOD ARM RIGHT   Final    Special Requests BOTTLES DRAWN AEROBIC ONLY 4CC   Final    Culture  Setup Time [id number]   Final    Culture     Final    Value:        BLOOD CULTURE RECEIVED NO GROWTH TO DATE CULTURE WILL BE HELD FOR 5 DAYS BEFORE ISSUING A FINAL NEGATIVE REPORT   Report Status PENDING   Incomplete   CLOSTRIDIUM DIFFICILE BY PCR     Status: Abnormal   Collection Time   11/21/11  4:06 PM      Component Value Range Status Comment   C difficile by pcr POSITIVE (*) NEGATIVE  Final   URINE CULTURE     Status: Normal   Collection Time   11/21/11  4:50 PM      Component Value Range Status Comment   Specimen Description URINE, CATHETERIZED   Final    Special Requests NONE   Final    Culture  Setup Time [id number]   Final    Colony Count NO GROWTH   Final    Culture NO GROWTH   Final    Report Status 11/23/2011 FINAL   Final   MRSA PCR SCREENING     Status: Normal   Collection Time   11/21/11 10:27 PM      Component Value Range Status Comment   MRSA by PCR NEGATIVE  NEGATIVE  Final     Medical History: Past Medical History  Diagnosis Date  . 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 MVP (mitral valve prolapse)   . Mitral regurgitation   . Shortness of breath   . Heart murmur   . Neuromuscular disorder     numbness in hands and feet  . Arthritis   . Overactive bladder   . Pleural effusion, left 11/11/2011    Medications:  Scheduled:    . amiodarone  150 mg Intravenous Once  . famotidine  20 mg Oral BID  . folic acid  1 mg Oral Daily  . imipramine   25 mg Oral QHS  . iron dextran (INFED/DEXFERRUM) IVPB (TEST DOSE)  25 mg Intravenous Once  . metoprolol  2.5 mg Intravenous Q6H  . metronidazole  500 mg Intravenous Q8H  . pneumococcal 23 valent vaccine  0.5 mL Intramuscular Tomorrow-1000  . polysaccharide iron  150 mg Oral Daily  . potassium chloride SA  20 mEq Oral BID  . pravastatin  80 mg Oral q1800  . saccharomyces boulardii  250 mg Oral BID  . simethicone  80 mg Oral QID  . sodium chloride  3 mL Intravenous Q12H  . Somatropin  0.5333 mg Subcutaneous QPM  . tolterodine  4 mg Oral Daily  . vancomycin  125 mg Oral Q6H  . Warfarin - Pharmacist Dosing Inpatient   Does not apply q1800    Assessment: 56 yo F hgb currently 10.2.  Correction to 15 requires 1g iron dextran.  Goal of Therapy:  Iron repletion.  Plan:  Iron dextran 25 mg x1 test dose. If no reaction with test dose, iron dextran 1000 mg IV x1. Noted that patient is already on niferex daily.  With noted c diff, will not order laxative at this time. However, consider upon d/c if appropriate.  Pharmacy will sign off, please reconsult as needed.   Jayven Naill L. Illene Bolus, PharmD, BCPS Clinical Pharmacist Pager: [phone] 11/23/2011 3:59 PM

## 2011-11-23 NOTE — Progress Notes (Signed)
ANTICOAGULATION CONSULT NOTE - Initial Consult  Pharmacy Consult for Coumadin Indication: Afib/MV repair  Assessment: 56 yo female on Coumadin PTA for Afib and MV repair, currently on hold for elevated INR (4.61 yesterday).  Patient refused all lab draws today, but INR is unlikely to have declined that quickly, so will continue to hold and check tomorrow. INR elevation likely related to interactions with metronidazole and amiodarone.  Home coumadin dose is 2.5mg  daily.  No bleeding issues reported.  Patient continues on PO vancomycin + IV metronidazole for severe Cdiff infection.  Tm 100.6, WBC 15.9, ongoing diarrhea.  Goal of Therapy:  INR 2-2.5   Plan:  -Continue to hold coumadin today -Check INR in AM -Continue vancomycin 125mg  PO QID  Bayard Beaver. Saul Fordyce, PharmD Pager: [phone]  11/23/2011 3:00 PM    Allergies  Allergen Reactions  . Compazine Other (See Comments)    "eyes get buggy"    Patient Measurements: Height: 4\' 11"  (149.9 cm) Weight: 93 lb 7.6 oz (42.4 kg) IBW/kg (Calculated) : 43.2   Vital Signs: Temp: 98 F (36.7 C) (03/16 1200) Temp src: Oral (03/16 1200) BP: 97/69 mmHg (03/16 1200)  Labs:  Basename 11/22/11 1353 11/22/11 0600 11/22/11 0048 11/21/11 2319 11/21/11 1327  HGB -- 10.2* -- -- 11.1*  HCT -- 31.3* -- -- 33.8*  PLT -- 75* -- -- 80*  APTT -- -- 51* -- --  LABPROT -- -- 44.2* -- --  INR -- -- 4.61* -- --  HEPARINUNFRC -- -- -- -- --  CREATININE -- 0.51 -- -- 0.45*  CKTOTAL 20 23 -- 19 --  CKMB 2.1 2.2 -- 1.8 --  TROPONINI <0.30 <0.30 -- <0.30 --   Estimated Creatinine Clearance: 54.4 ml/min (by C-G formula based on Cr of 0.51).  Medical History: Past Medical History  Diagnosis Date  . 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 MVP (mitral valve prolapse)   . Mitral regurgitation   . Shortness of breath   . Heart murmur   . Neuromuscular disorder     numbness in hands and feet  . Arthritis   . Overactive bladder   .  Pleural effusion, left 11/11/2011    Medications:  Prescriptions prior to admission  Medication Sig Dispense Refill  . folic acid (FOLVITE) 1 MG tablet Take 1 mg by mouth daily.      Marland Kitchen imipramine (TOFRANIL) 25 MG tablet Take 25 mg by mouth at bedtime.       Marland Kitchen levofloxacin (LEVAQUIN) 500 MG tablet Take 500 mg by mouth daily. For 14 days Started 3/6      . loperamide (IMODIUM) 2 MG capsule Take 2 mg by mouth 4 (four) times daily as needed. After loose stools      . metoprolol tartrate (LOPRESSOR) 12.5 mg TABS Take 12.5 mg by mouth 2 (two) times daily.      . metroNIDAZOLE (FLAGYL) 500 MG tablet Take 500 mg by mouth 3 (three) times daily.      . polysaccharide iron (NIFEREX) 150 MG CAPS capsule Take 1 capsule (150 mg total) by mouth daily.  30 each  0  . potassium chloride SA (K-DUR,KLOR-CON) 20 MEQ tablet Take 20 mEq by mouth 2 (two) times daily.      . pravastatin (PRAVACHOL) 80 MG tablet Take 80 mg by mouth daily.      . Somatropin (NORDITROPIN FLEXPRO) 10 MG/1.5ML SOLN Inject 0.5 mg into the skin every evening.      Marland Kitchen  tolterodine (DETROL LA) 4 MG 24 hr capsule Take 4 mg by mouth daily.       . traMADol (ULTRAM) 50 MG tablet Take 50 mg by mouth every 6 (six) hours as needed. For pain.       Scheduled:     . amiodarone  150 mg Intravenous Once  . famotidine  20 mg Oral BID  . folic acid  1 mg Oral Daily  . imipramine  25 mg Oral QHS  . metoprolol  2.5 mg Intravenous Q6H  . metronidazole  500 mg Intravenous Q8H  . pneumococcal 23 valent vaccine  0.5 mL Intramuscular Tomorrow-1000  . polysaccharide iron  150 mg Oral Daily  . potassium chloride SA  20 mEq Oral BID  . pravastatin  80 mg Oral q1800  . saccharomyces boulardii  250 mg Oral BID  . simethicone  80 mg Oral QID  . sodium chloride  3 mL Intravenous Q12H  . Somatropin  0.5333 mg Subcutaneous QPM  . tolterodine  4 mg Oral Daily  . vancomycin  125 mg Oral Q6H  . Warfarin - Pharmacist Dosing Inpatient   Does not apply q1800    Infusions:     . sodium chloride 100 mL/hr at 11/23/11 0914  . amiodarone (NEXTERONE PREMIX) 360 mg/200 mL dextrose 0.5 mg/min (11/23/11 0754)

## 2011-11-23 NOTE — Progress Notes (Signed)
Patient ID: Michaela [name], female   DOB: 1956/02/13, 56 y.o.   MRN: [id number]    SUBJECTIVE: Doing better this am.  Remains in NSR on IV amiodarone.  Mild abdominal pain.  BP stable.  Ongoing diarrhea.      Marland Kitchen amiodarone  150 mg Intravenous Once  . folic acid  1 mg Oral Daily  . imipramine  25 mg Oral QHS  . metoprolol  2.5 mg Intravenous Q6H  . metronidazole  500 mg Intravenous Q8H  . pneumococcal 23 valent vaccine  0.5 mL Intramuscular Tomorrow-1000  . polysaccharide iron  150 mg Oral Daily  . potassium chloride SA  20 mEq Oral BID  . pravastatin  80 mg Oral q1800  . saccharomyces boulardii  250 mg Oral BID  . sodium chloride  250 mL Intravenous Once  . sodium chloride  3 mL Intravenous Q12H  . Somatropin  0.5333 mg Subcutaneous QPM  . tolterodine  4 mg Oral Daily  . vancomycin  125 mg Oral Q6H  . Warfarin - Pharmacist Dosing Inpatient   Does not apply q1800  . DISCONTD: simvastatin  40 mg Oral q1800      Filed Vitals:   11/23/11 0500 11/23/11 0600 11/23/11 0750 11/23/11 0800  BP:  96/57  101/44  Pulse:      Temp:   98.1 F (36.7 C)   TempSrc:   Oral   Resp: 13 17  16   Height:      Weight:      SpO2: 99% 98%  98%    Intake/Output Summary (Last 24 hours) at 11/23/11 0901 Last data filed at 11/23/11 0800  Gross per 24 hour  Intake 2437.4 ml  Output    890 ml  Net 1547.4 ml    LABS: Basic Metabolic Panel:  Basename 11/22/11 0600 11/22/11 0048 11/21/11 1327  NA 137 -- 128*  K 3.5 -- 3.5  CL 108 -- 95*  CO2 20 -- 23  GLUCOSE 86 -- 127*  BUN 11 -- 16  CREATININE 0.51 -- 0.45*  CALCIUM 7.1* -- 7.6*  MG -- 1.7 --  PHOS -- -- --   Liver Function Tests:  Basename 11/22/11 0600 11/21/11 1327  AST 19 25  ALT 12 17  ALKPHOS 96 103  BILITOT 0.2* 0.2*  PROT 4.4* 4.8*  ALBUMIN 1.4* 1.6*   No results found for this basename: LIPASE:2,AMYLASE:2 in the last 72 hours CBC:  Basename 11/22/11 0600 11/21/11 1327  WBC 15.9* 16.4*  NEUTROABS 13.0* --  HGB  10.2* 11.1*  HCT 31.3* 33.8*  MCV 85.1 83.9  PLT 75* 80*   Cardiac Enzymes:  Basename 11/22/11 1353 11/22/11 0600 11/21/11 2319  CKTOTAL 20 23 19   CKMB 2.1 2.2 1.8  CKMBINDEX -- -- --  TROPONINI <0.30 <0.30 <0.30    Basename 11/22/11 0600  VITAMINB12 1512*  FOLATE 9.9  FERRITIN 886*  TIBC Not calculated due to Iron <10.  IRON <10*  RETICCTPCT --    RADIOLOGY: Dg Chest 2 View  11/21/2011  *RADIOLOGY REPORT*  Clinical Data: Chest pain  CHEST - 2 VIEW  Comparison: November 11, 2011  Findings: A left pleural effusion has decreased slightly.  The cardiac silhouette, mediastinum, pulmonary vasculature are within normal limits.  Previously seen right mid lung nodularity is not appreciated today.  Shunt tubing projects over the right hemithorax.  Mitral valve prosthesis is again noted.  IMPRESSION: There has been interval decrease in the size of the small left pleural effusions since  the prior study.  Original Report Authenticated By: Brandon Melnick, M.D.    PHYSICAL EXAM General: NAD, frail Neck: No JVD, no thyromegaly or thyroid nodule.  Lungs: Clear to auscultation bilaterally with normal respiratory effort. CV: Nondisplaced PMI.  Heart regular S1/S2, no S3/S4, no murmur.  No peripheral edema.  No carotid bruit.  Normal pedal pulses.  Abdomen: Soft, mild diffuse tenderness, no hepatosplenomegaly, no distention.  Neurologic: Alert and oriented x 3. Head bobbing tremor Psych: Normal affect. Extremities: No clubbing or cyanosis.   TELEMETRY: Reviewed telemetry pt in NSR.  Had afib with RVR last night.   ASSESSMENT AND PLAN:  56 yo with history of severe MR/MV repair was re-admitted from SNF with C.difficile colitis.  She went into atrial fibrillation with RVR last night.   1.  Atrial fibrillation: Now in NSR on amiodarone gtt.  Will continue gtt for now, transition po amiodarone when colitis bett  Large LA so will have predisposition to atrial fibrillation at times of stress.  2.   C.difficile colitis:  On po vancomycin and IV Flagyl.  BP now stable.  Decrease NS to 50 cc/hr given small size.  3.  Mitral valve repair: Will continue coumadin as hemoglobin is stable.   Charlton Haws 11/23/2011 9:01 AM

## 2011-11-24 LAB — CBC
Hemoglobin: 11.7 g/dL — ABNORMAL LOW (ref 12.0–15.0)
MCHC: 33 g/dL (ref 30.0–36.0)
WBC: 29.3 10*3/uL — ABNORMAL HIGH (ref 4.0–10.5)

## 2011-11-24 LAB — PROTIME-INR
INR: 3.66 — ABNORMAL HIGH (ref 0.00–1.49)
Prothrombin Time: 36.9 seconds — ABNORMAL HIGH (ref 11.6–15.2)

## 2011-11-24 NOTE — Progress Notes (Signed)
Patient ID: Michaela [name], female   DOB: 1956/02/28, 56 y.o.   MRN: [id number]    SUBJECTIVE: Doing better this am.  Remains in NSR on IV amiodarone.  Mild abdominal pain.  BP stable.  Ongoing diarrhea.      Marland Kitchen amiodarone  150 mg Intravenous Once  . famotidine  20 mg Oral BID  . folic acid  1 mg Oral Daily  . imipramine  25 mg Oral QHS  . iron dextran (INFED/DEXFERRUM) infusion  1,000 mg Intravenous Once  . iron dextran (INFED/DEXFERRUM) IVPB (TEST DOSE)  25 mg Intravenous Once  . metoprolol  2.5 mg Intravenous Q6H  . metronidazole  500 mg Intravenous Q8H  . pneumococcal 23 valent vaccine  0.5 mL Intramuscular Tomorrow-1000  . polysaccharide iron  150 mg Oral Daily  . potassium chloride SA  20 mEq Oral BID  . pravastatin  80 mg Oral q1800  . saccharomyces boulardii  250 mg Oral BID  . simethicone  80 mg Oral QID  . sodium chloride  3 mL Intravenous Q12H  . tolterodine  4 mg Oral Daily  . vancomycin  125 mg Oral Q6H  . Warfarin - Pharmacist Dosing Inpatient   Does not apply q1800  . DISCONTD: senna-docusate  2 tablet Oral QHS  . DISCONTD: Somatropin  0.5333 mg Subcutaneous QPM      Filed Vitals:   11/24/11 0400 11/24/11 0500 11/24/11 0600 11/24/11 0818  BP:      Pulse:      Temp: 97.3 F (36.3 C)   99 F (37.2 C)  TempSrc: Oral   Oral  Resp: 13 11 14    Height:      Weight:      SpO2:        Intake/Output Summary (Last 24 hours) at 11/24/11 0849 Last data filed at 11/24/11 0700  Gross per 24 hour  Intake 4550.8 ml  Output    770 ml  Net 3780.8 ml    LABS: Basic Metabolic Panel:  Basename 11/22/11 0600 11/22/11 0048 11/21/11 1327  NA 137 -- 128*  K 3.5 -- 3.5  CL 108 -- 95*  CO2 20 -- 23  GLUCOSE 86 -- 127*  BUN 11 -- 16  CREATININE 0.51 -- 0.45*  CALCIUM 7.1* -- 7.6*  MG -- 1.7 --  PHOS -- -- --   Liver Function Tests:  Basename 11/22/11 0600 11/21/11 1327  AST 19 25  ALT 12 17  ALKPHOS 96 103  BILITOT 0.2* 0.2*  PROT 4.4* 4.8*  ALBUMIN  1.4* 1.6*   No results found for this basename: LIPASE:2,AMYLASE:2 in the last 72 hours CBC:  Basename 11/22/11 0600 11/21/11 1327  WBC 15.9* 16.4*  NEUTROABS 13.0* --  HGB 10.2* 11.1*  HCT 31.3* 33.8*  MCV 85.1 83.9  PLT 75* 80*   Cardiac Enzymes:  Basename 11/22/11 1353 11/22/11 0600 11/21/11 2319  CKTOTAL 20 23 19   CKMB 2.1 2.2 1.8  CKMBINDEX -- -- --  TROPONINI <0.30 <0.30 <0.30    Basename 11/22/11 0600  VITAMINB12 1512*  FOLATE 9.9  FERRITIN 886*  TIBC Not calculated due to Iron <10.  IRON <10*  RETICCTPCT --    RADIOLOGY: Dg Chest 2 View  11/21/2011  *RADIOLOGY REPORT*  Clinical Data: Chest pain  CHEST - 2 VIEW  Comparison: November 11, 2011  Findings: A left pleural effusion has decreased slightly.  The cardiac silhouette, mediastinum, pulmonary vasculature are within normal limits.  Previously seen right mid lung nodularity is  not appreciated today.  Shunt tubing projects over the right hemithorax.  Mitral valve prosthesis is again noted.  IMPRESSION: There has been interval decrease in the size of the small left pleural effusions since the prior study.  Original Report Authenticated By: Brandon Melnick, M.D.    PHYSICAL EXAM General: NAD, frail Neck: No JVD, no thyromegaly or thyroid nodule.  Lungs: Clear to auscultation bilaterally with normal respiratory effort. CV: Nondisplaced PMI.  Heart regular S1/S2, no S3/S4, no murmur.  No peripheral edema.  No carotid bruit.  Normal pedal pulses.  Abdomen: Soft, mild diffuse tenderness, no hepatosplenomegaly, no distention.  Neurologic: Alert and oriented x 3. Head bobbing tremor Psych: Normal affect. Extremities: No clubbing or cyanosis.   TELEMETRY: Reviewed telemetry pt in NSR.  Had afib with RVR last night.   ASSESSMENT AND PLAN:  56 yo with history of severe MR/MV repair was re-admitted from SNF with C.difficile colitis.  She went into atrial fibrillation with RVR    1.  Atrial fibrillation: Now in NSR on  amiodarone gtt.  Will continue gtt for now, transition po amiodarone when colitis bett  Large LA so will have predisposition to atrial fibrillation at times of stress.  2.  C.difficile colitis:  On po vancomycin and IV Flagyl.  BP now stable.  Decrease NS to 50 cc/hr given small size.  3.  Mitral valve repair: Will continue coumadin as hemoglobin is stable.   Charlton Haws 11/24/2011 8:49 AM

## 2011-11-24 NOTE — Progress Notes (Signed)
[facility]   Subjective: Rectal pouch came off and caused a mess of stool in the bed. RN had to remove foley as well as it was covered with stool.  Pt sitting up in chair. Admits to poor appetite. She thinks her belly is down a bit from yesterday. It is no longer hurting. She is tolerating clear liquids.   Objective: Vital signs in last 24 hours: Temp:  [96.5 F (35.8 C)-99.6 F (37.6 C)] 99 F (37.2 C) (03/17 0818) Pulse Rate:  [103-104] 104  (03/17 0314) Resp:  [11-18] 14  (03/17 0600) BP: (95-113)/(45-68) 108/62 mmHg (03/17 0818) SpO2:  [91 %-100 %] 98 % (03/17 0818) Weight:  [48.7 kg (107 lb 5.8 oz)] 48.7 kg (107 lb 5.8 oz) (03/17 0314) Weight change:  Last BM Date: 11/22/11  Intake/Output from previous day: 03/16 0701 - 03/17 0700 In: 4667.5 [I.V.:4517.5; IV Piggyback:150] Out: 810 [Urine:510; Stool:300] Intake/Output this shift: Total I/O In: 586.8 [P.O.:120; I.V.:466.8] Out: 50 [Urine:50]  General appearance: alert, cooperative, appears older than stated age- no distress Resp: clear to auscultation bilaterally Cardio: regular rate and rhythm, S1, S2 normal, no murmur, click, rub or gallop GI: soft, distended, tympanic, minimal tender but nonfocal without guarding or rebounding; bowel sounds present and quite active today Extremities: extremities normal, atraumatic, no cyanosis or edema Neurologic: At baseline for patient, no acute focal abnormalities, patient alert oriented x3, she has a tremor of her head  Lab Results:  Basename 11/24/11 1003 11/22/11 0600  WBC 29.3* 15.9*  HGB 11.7* 10.2*  HCT 35.5* 31.3*  PLT 72* 75*   BMET  Basename 11/22/11 0600 11/21/11 1327  NA 137 128*  K 3.5 3.5  CL 108 95*  CO2 20 23  GLUCOSE 86 127*  BUN 11 16  CREATININE 0.51 0.45*  CALCIUM 7.1* 7.6*    Studies/Results: Dg Abd Portable 2v  11/24/2011  *RADIOLOGY REPORT*  Clinical Data: Decreased bowel sounds with distended abdomen.  This  ABDOMEN PORTABLE 2 VIEW   Comparison: Abdomen radiograph 11/21/2011  Findings:  Gaseous distention of bowel loops is slightly decreased compared to study of 11/21/2011.  An ileus pattern persist.  Gas is seen in the rectum.  No significant stool burden.  No abdominal mass effect.  Suggestion of callus formation of the distal right eleventh rib, suspect a subacute or remote fracture.  There is suggestion of subcutaneous edema of the body wall adjacent to the iliac bones.  IMPRESSION: Ileus pattern.  Overall, slight improvement compared to 11/21/2011. Definite rectal gas is seen on today's study, Making a distal colonic obstruction unlikely.  Original Report Authenticated By: Britta Mccreedy, M.D.    Medications:  I have reviewed the patient's current medications. Scheduled:    . amiodarone  150 mg Intravenous Once  . famotidine  20 mg Oral BID  . folic acid  1 mg Oral Daily  . imipramine  25 mg Oral QHS  . iron dextran (INFED/DEXFERRUM) infusion  1,000 mg Intravenous Once  . iron dextran (INFED/DEXFERRUM) IVPB (TEST DOSE)  25 mg Intravenous Once  . metoprolol  2.5 mg Intravenous Q6H  . metronidazole  500 mg Intravenous Q8H  . polysaccharide iron  150 mg Oral Daily  . potassium chloride SA  20 mEq Oral BID  . pravastatin  80 mg Oral q1800  . saccharomyces boulardii  250 mg Oral BID  . simethicone  80 mg Oral QID  . sodium chloride  3 mL Intravenous Q12H  . tolterodine  4 mg Oral  Daily  . vancomycin  125 mg Oral Q6H  . Warfarin - Pharmacist Dosing Inpatient   Does not apply q1800  . DISCONTD: senna-docusate  2 tablet Oral QHS  . DISCONTD: Somatropin  0.5333 mg Subcutaneous QPM    Assessment/Plan:  Principal Problem:  *Sepsis (systemic inflammatory response syndrome) *Etiology secondary to acute C. difficile colitis   Diarrhea/ C. difficile colitis *Started on IV Flagyl and oral vancomycin this admission Cont on clear liquids for today and monitor stool output closely.   Abdominal distension Ileus on xray  from yesterday. I had to cut her back from a regular diet to clear liquids.  Although Bowel sounds have returned, she is still quite distended.    Hypotension/Dehydration/Leukocytosis *Has chronic hypotension with systolic ranging 16-10 Cont fluids at current rate due to poor PO intake and ongoing watery diarrhea.    Atrial fibrillation with RVR Converted to NSR.  Cards will keep on IV Amio until colitis improves.  We have resumed IV Lopressor as well with holding parameters On Coumadin   Diastolic CHF, chronic *Compensated at present but will be cautious with volume resuscitation- follow I and O carefully.    Encephalopathy acute *Secondary to acute illness and has resolved   Hyponatremia *Secondary to dehydration and is now resolved    Chest pain *Has resolved, EKG nonischemic and cardiac isoenzymes negative x3 collection but we'll continue to monitor   Mitral regurgitation due to cusp prolapse/S/P mitral valve repair *Is on chronic Coumadin anticoagulation *Dr. Cornelius Moras with [facility]. has seen this patient this admission and no acute problems identified  Warfarin coagulopathy *INR somewhat supratherapeutic at 4.64 today *Pharmacy managing Coumadin suspect we'll hold dosage tonight   Brain tumor, astrocytoma/Neuromuscular disorder *No acute issues   Anemia- Iron deficiency  *Hemoglobin drifting down with hydration *Baseline hemoglobin 10.53 weeks ago one month ago she was as low as 8.0 - will start IV iron. Hold off on checking stools for occult blood as the C diff colitis will make her stool positive as well.    Disposition *Remain in stepdown as long as she is on IV Amiodorone.    LOS: 3 days   11/24/2011, 12:21 PM  Calvert Cantor, MD [phone]

## 2011-11-24 NOTE — Progress Notes (Signed)
ANTICOAGULATION CONSULT NOTE - Initial Consult  Pharmacy Consult for Coumadin Indication: Afib/MV repair  Assessment: 55 yoF on Coumadin (INR goal 2.0-2.5) PTA for Afib and MV repair, currently on hold for elevated INR up to 4.61, now trending down, currently 3.66. INR elevation likely related to interactions with metronidazole and amiodarone. Home coumadin dose is 2.5mg  daily. No bleeding issues reported. Hgb improved - received iron dextran 03/16. Plts low, trending down.    Goal of Therapy:  INR 2-2.5   Plan:  -Continue to hold coumadin today -Check INR in AM -F/u platelets  Haynes Hoehn, PharmD 11/24/2011 1:06 PM  Pager: [phone]    Allergies  Allergen Reactions  . Compazine Other (See Comments)    "eyes get buggy"    Patient Measurements: Height: 4\' 11"  (149.9 cm) Weight: 107 lb 5.8 oz (48.7 kg) IBW/kg (Calculated) : 43.2   Vital Signs: Temp: 99 F (37.2 C) (03/17 0818) Temp src: Oral (03/17 0818) BP: 108/62 mmHg (03/17 0818) Pulse Rate: 104  (03/17 0314)  Labs:  Basename 11/24/11 1003 11/22/11 1353 11/22/11 0600 11/22/11 0048 11/21/11 2319 11/21/11 1327  HGB 11.7* -- 10.2* -- -- --  HCT 35.5* -- 31.3* -- -- 33.8*  PLT 72* -- 75* -- -- 80*  APTT -- -- -- 51* -- --  LABPROT 36.9* -- -- 44.2* -- --  INR 3.66* -- -- 4.61* -- --  HEPARINUNFRC -- -- -- -- -- --  CREATININE -- -- 0.51 -- -- 0.45*  CKTOTAL -- 20 23 -- 19 --  CKMB -- 2.1 2.2 -- 1.8 --  TROPONINI -- <0.30 <0.30 -- <0.30 --   Estimated Creatinine Clearance: 55.5 ml/min (by C-G formula based on Cr of 0.51).  Medical History: Past Medical History  Diagnosis Date  . 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 MVP (mitral valve prolapse)   . Mitral regurgitation   . Shortness of breath   . Heart murmur   . Neuromuscular disorder     numbness in hands and feet  . Arthritis   . Overactive bladder   . Pleural effusion, left 11/11/2011    Medications:  Prescriptions prior to  admission  Medication Sig Dispense Refill  . folic acid (FOLVITE) 1 MG tablet Take 1 mg by mouth daily.      Marland Kitchen imipramine (TOFRANIL) 25 MG tablet Take 25 mg by mouth at bedtime.       Marland Kitchen levofloxacin (LEVAQUIN) 500 MG tablet Take 500 mg by mouth daily. For 14 days Started 3/6      . loperamide (IMODIUM) 2 MG capsule Take 2 mg by mouth 4 (four) times daily as needed. After loose stools      . metoprolol tartrate (LOPRESSOR) 12.5 mg TABS Take 12.5 mg by mouth 2 (two) times daily.      . metroNIDAZOLE (FLAGYL) 500 MG tablet Take 500 mg by mouth 3 (three) times daily.      . polysaccharide iron (NIFEREX) 150 MG CAPS capsule Take 1 capsule (150 mg total) by mouth daily.  30 each  0  . potassium chloride SA (K-DUR,KLOR-CON) 20 MEQ tablet Take 20 mEq by mouth 2 (two) times daily.      . pravastatin (PRAVACHOL) 80 MG tablet Take 80 mg by mouth daily.      . Somatropin (NORDITROPIN FLEXPRO) 10 MG/1.5ML SOLN Inject 0.5 mg into the skin every evening.      . tolterodine (DETROL LA) 4 MG 24 hr capsule Take 4  mg by mouth daily.       . traMADol (ULTRAM) 50 MG tablet Take 50 mg by mouth every 6 (six) hours as needed. For pain.       Scheduled:     . amiodarone  150 mg Intravenous Once  . famotidine  20 mg Oral BID  . folic acid  1 mg Oral Daily  . imipramine  25 mg Oral QHS  . iron dextran (INFED/DEXFERRUM) infusion  1,000 mg Intravenous Once  . iron dextran (INFED/DEXFERRUM) IVPB (TEST DOSE)  25 mg Intravenous Once  . metoprolol  2.5 mg Intravenous Q6H  . metronidazole  500 mg Intravenous Q8H  . polysaccharide iron  150 mg Oral Daily  . potassium chloride SA  20 mEq Oral BID  . pravastatin  80 mg Oral q1800  . saccharomyces boulardii  250 mg Oral BID  . simethicone  80 mg Oral QID  . sodium chloride  3 mL Intravenous Q12H  . tolterodine  4 mg Oral Daily  . vancomycin  125 mg Oral Q6H  . Warfarin - Pharmacist Dosing Inpatient   Does not apply q1800  . DISCONTD: senna-docusate  2 tablet Oral QHS    . DISCONTD: Somatropin  0.5333 mg Subcutaneous QPM   Infusions:     . sodium chloride 100 mL/hr at 11/23/11 2100  . amiodarone (NEXTERONE PREMIX) 360 mg/200 mL dextrose 0.5 mg/min (11/23/11 2059)

## 2011-11-25 ENCOUNTER — Ambulatory Visit: Payer: Medicare Other | Admitting: Thoracic Surgery (Cardiothoracic Vascular Surgery)

## 2011-11-25 ENCOUNTER — Inpatient Hospital Stay (HOSPITAL_COMMUNITY): Payer: Medicare Other

## 2011-11-25 ENCOUNTER — Other Ambulatory Visit: Payer: Self-pay

## 2011-11-25 ENCOUNTER — Ambulatory Visit: Payer: Medicare Other

## 2011-11-25 DIAGNOSIS — J9600 Acute respiratory failure, unspecified whether with hypoxia or hypercapnia: Secondary | ICD-10-CM

## 2011-11-25 DIAGNOSIS — D649 Anemia, unspecified: Secondary | ICD-10-CM

## 2011-11-25 DIAGNOSIS — A0472 Enterocolitis due to Clostridium difficile, not specified as recurrent: Secondary | ICD-10-CM

## 2011-11-25 DIAGNOSIS — C719 Malignant neoplasm of brain, unspecified: Secondary | ICD-10-CM

## 2011-11-25 DIAGNOSIS — G9340 Encephalopathy, unspecified: Secondary | ICD-10-CM

## 2011-11-25 DIAGNOSIS — R6521 Severe sepsis with septic shock: Secondary | ICD-10-CM

## 2011-11-25 LAB — PROTIME-INR
INR: 7.79 (ref 0.00–1.49)
Prothrombin Time: 66.6 seconds — ABNORMAL HIGH (ref 11.6–15.2)
Prothrombin Time: 19.7 seconds — ABNORMAL HIGH (ref 11.6–15.2)

## 2011-11-25 LAB — CBC
Hemoglobin: 11.3 g/dL — ABNORMAL LOW (ref 12.0–15.0)
MCHC: 33.6 g/dL (ref 30.0–36.0)
RDW: 17.1 % — ABNORMAL HIGH (ref 11.5–15.5)
WBC: 47.1 10*3/uL — ABNORMAL HIGH (ref 4.0–10.5)

## 2011-11-25 LAB — URINALYSIS, MICROSCOPIC ONLY
Hgb urine dipstick: NEGATIVE
Nitrite: POSITIVE — AB
Specific Gravity, Urine: 1.025 (ref 1.005–1.030)
pH: 5.5 (ref 5.0–8.0)

## 2011-11-25 LAB — POCT I-STAT 3, ART BLOOD GAS (G3+)
Acid-base deficit: 6 mmol/L — ABNORMAL HIGH (ref 0.0–2.0)
Acid-base deficit: 6 mmol/L — ABNORMAL HIGH (ref 0.0–2.0)
Acid-base deficit: 7 mmol/L — ABNORMAL HIGH (ref 0.0–2.0)
Bicarbonate: 19.3 mEq/L — ABNORMAL LOW (ref 20.0–24.0)
O2 Saturation: 98 %
O2 Saturation: 93 %
O2 Saturation: 89 %
Patient temperature: 96.4
Patient temperature: 98.2
TCO2: 21 mmol/L (ref 0–100)
pCO2 arterial: 42.2 mmHg (ref 35.0–45.0)
pO2, Arterial: 110 mmHg — ABNORMAL HIGH (ref 80.0–100.0)
pO2, Arterial: 71 mmHg — ABNORMAL LOW (ref 80.0–100.0)
pO2, Arterial: 64 mmHg — ABNORMAL LOW (ref 80.0–100.0)

## 2011-11-25 LAB — BLOOD GAS, ARTERIAL
Acid-base deficit: 4.6 mmol/L — ABNORMAL HIGH (ref 0.0–2.0)
Drawn by: 317771
O2 Content: 2 L/min
O2 Saturation: 91.3 %
pCO2 arterial: 34.7 mmHg — ABNORMAL LOW (ref 35.0–45.0)

## 2011-11-25 LAB — PHOSPHORUS: Phosphorus: 1.9 mg/dL — ABNORMAL LOW (ref 2.3–4.6)

## 2011-11-25 LAB — COMPREHENSIVE METABOLIC PANEL
ALT: 13 U/L (ref 0–35)
Albumin: 1.9 g/dL — ABNORMAL LOW (ref 3.5–5.2)
Alkaline Phosphatase: 74 U/L (ref 39–117)
BUN: 8 mg/dL (ref 6–23)
Potassium: 3.3 mEq/L — ABNORMAL LOW (ref 3.5–5.1)
Sodium: 134 mEq/L — ABNORMAL LOW (ref 135–145)
Total Protein: 4.3 g/dL — ABNORMAL LOW (ref 6.0–8.3)

## 2011-11-25 LAB — STOOL CULTURE

## 2011-11-25 LAB — GLUCOSE, CAPILLARY: Glucose-Capillary: 106 mg/dL — ABNORMAL HIGH (ref 70–99)

## 2011-11-25 LAB — CARBOXYHEMOGLOBIN: O2 Saturation: 77.4 %

## 2011-11-25 LAB — PRO B NATRIURETIC PEPTIDE: Pro B Natriuretic peptide (BNP): 1958 pg/mL — ABNORMAL HIGH (ref 0–125)

## 2011-11-25 LAB — PROCALCITONIN: Procalcitonin: 0.68 ng/mL

## 2011-11-25 MED ORDER — DEXTROSE 5 % IV SOLN
1.0000 g | Freq: Three times a day (TID) | INTRAVENOUS | Status: DC
  Administered 2011-11-25 – 2011-11-28 (×9): 1 g via INTRAVENOUS
  Filled 2011-11-25 (×11): qty 1

## 2011-11-25 MED ORDER — DOBUTAMINE IN D5W 4-5 MG/ML-% IV SOLN: 2.5000 ug/kg/min | INTRAVENOUS | Status: DC | PRN

## 2011-11-25 MED ORDER — METRONIDAZOLE IN NACL 5-0.79 MG/ML-% IV SOLN
500.0000 mg | Freq: Four times a day (QID) | INTRAVENOUS | Status: DC
  Administered 2011-11-25 – 2011-12-01 (×23): 500 mg via INTRAVENOUS
  Filled 2011-11-25 (×27): qty 100

## 2011-11-25 MED ORDER — SODIUM CHLORIDE 0.9 % IV SOLN
Freq: Once | INTRAVENOUS | Status: AC
  Administered 2011-11-25: 11:00:00 via INTRAVENOUS

## 2011-11-25 MED ORDER — SUCCINYLCHOLINE CHLORIDE 20 MG/ML IJ SOLN
INTRAMUSCULAR | Status: AC
  Filled 2011-11-25: qty 10

## 2011-11-25 MED ORDER — METOPROLOL TARTRATE 25 MG PO TABS
25.0000 mg | ORAL_TABLET | Freq: Four times a day (QID) | ORAL | Status: DC
  Administered 2011-11-25: 25 mg via ORAL
  Filled 2011-11-25: qty 1

## 2011-11-25 MED ORDER — MIDAZOLAM HCL 2 MG/2ML IJ SOLN
INTRAMUSCULAR | Status: AC
  Administered 2011-11-25: 2 mg
  Filled 2011-11-25: qty 4

## 2011-11-25 MED ORDER — BIOTENE DRY MOUTH MT LIQD
15.0000 mL | Freq: Four times a day (QID) | OROMUCOSAL | Status: DC
  Administered 2011-11-25 – 2011-11-29 (×15): 15 mL via OROMUCOSAL

## 2011-11-25 MED ORDER — LEVALBUTEROL HCL 0.63 MG/3ML IN NEBU
0.6300 mg | INHALATION_SOLUTION | Freq: Four times a day (QID) | RESPIRATORY_TRACT | Status: DC | PRN
  Administered 2011-11-25: 0.63 mg via RESPIRATORY_TRACT
  Filled 2011-11-25: qty 3

## 2011-11-25 MED ORDER — VANCOMYCIN HCL 500 MG IV SOLR
500.0000 mg | Freq: Four times a day (QID) | Status: DC
  Administered 2011-11-25 – 2011-11-29 (×15): 500 mg via RECTAL
  Filled 2011-11-25 (×22): qty 500

## 2011-11-25 MED ORDER — SODIUM CHLORIDE 0.9 % IV BOLUS (SEPSIS): 500.0000 mL | Freq: Once | INTRAVENOUS | Status: DC

## 2011-11-25 MED ORDER — METOPROLOL TARTRATE 1 MG/ML IV SOLN: 5.0000 mg | INTRAVENOUS | Status: DC

## 2011-11-25 MED ORDER — VITAMIN K1 10 MG/ML IJ SOLN
5.0000 mg | Freq: Once | INTRAVENOUS | Status: AC
  Administered 2011-11-25: 5 mg via INTRAVENOUS
  Filled 2011-11-25: qty 0.5

## 2011-11-25 MED ORDER — FENTANYL CITRATE 0.05 MG/ML IJ SOLN
50.0000 ug | INTRAMUSCULAR | Status: DC | PRN
  Administered 2011-11-26 (×2): 50 ug via INTRAVENOUS
  Administered 2011-11-27: 100 ug via INTRAVENOUS
  Administered 2011-11-27 (×3): 50 ug via INTRAVENOUS
  Administered 2011-11-28 (×3): 100 ug via INTRAVENOUS
  Filled 2011-11-25 (×9): qty 2

## 2011-11-25 MED ORDER — INSULIN ASPART 100 UNIT/ML ~~LOC~~ SOLN
0.0000 [IU] | SUBCUTANEOUS | Status: DC
  Administered 2011-11-28 (×3): 1 [IU] via SUBCUTANEOUS

## 2011-11-25 MED ORDER — ROCURONIUM BROMIDE 50 MG/5ML IV SOLN
INTRAVENOUS | Status: AC
  Filled 2011-11-25: qty 2

## 2011-11-25 MED ORDER — IOHEXOL 300 MG/ML  SOLN: 20.0000 mL | INTRAMUSCULAR | Status: AC

## 2011-11-25 MED ORDER — CHLORHEXIDINE GLUCONATE 0.12 % MT SOLN
15.0000 mL | Freq: Two times a day (BID) | OROMUCOSAL | Status: DC
  Administered 2011-11-25 – 2011-11-29 (×9): 15 mL via OROMUCOSAL
  Filled 2011-11-25 (×9): qty 15

## 2011-11-25 MED ORDER — ETOMIDATE 2 MG/ML IV SOLN
INTRAVENOUS | Status: AC
  Administered 2011-11-25: 15 mg
  Filled 2011-11-25: qty 20

## 2011-11-25 MED ORDER — SODIUM CHLORIDE 0.9 % IV BOLUS (SEPSIS): 500.0000 mL | INTRAVENOUS | Status: DC | PRN

## 2011-11-25 MED ORDER — VANCOMYCIN 50 MG/ML ORAL SOLUTION
250.0000 mg | Freq: Four times a day (QID) | ORAL | Status: DC
  Administered 2011-11-25 – 2011-11-26 (×4): 250 mg via ORAL
  Filled 2011-11-25 (×8): qty 5

## 2011-11-25 MED ORDER — FENTANYL CITRATE 0.05 MG/ML IJ SOLN
INTRAMUSCULAR | Status: AC
  Administered 2011-11-25: 100 ug
  Filled 2011-11-25: qty 2

## 2011-11-25 MED ORDER — IOHEXOL 300 MG/ML  SOLN
20.0000 mL | INTRAMUSCULAR | Status: AC
  Administered 2011-11-25 (×2): 20 mL via ORAL

## 2011-11-25 MED ORDER — METOPROLOL TARTRATE 1 MG/ML IV SOLN
2.5000 mg | Freq: Once | INTRAVENOUS | Status: AC
  Administered 2011-11-25: 2.5 mg via INTRAVENOUS

## 2011-11-25 MED ORDER — LIDOCAINE HCL (CARDIAC) 20 MG/ML IV SOLN
INTRAVENOUS | Status: AC
  Filled 2011-11-25: qty 5

## 2011-11-25 MED ORDER — NOREPINEPHRINE BITARTRATE 1 MG/ML IJ SOLN
2.0000 ug/min | INTRAVENOUS | Status: DC | PRN
  Filled 2011-11-25: qty 4

## 2011-11-25 MED ORDER — MIDAZOLAM HCL 2 MG/2ML IJ SOLN
2.0000 mg | INTRAMUSCULAR | Status: DC | PRN
  Administered 2011-11-25 – 2011-11-27 (×8): 2 mg via INTRAVENOUS
  Filled 2011-11-25 (×8): qty 2

## 2011-11-25 MED ORDER — FAMOTIDINE IN NACL 20-0.9 MG/50ML-% IV SOLN
20.0000 mg | INTRAVENOUS | Status: DC
  Administered 2011-11-25 – 2011-11-26 (×2): 20 mg via INTRAVENOUS
  Filled 2011-11-25 (×2): qty 50

## 2011-11-25 MED ORDER — DEXTROSE 5 % IV SOLN
30.0000 ug/min | INTRAVENOUS | Status: DC | PRN
  Administered 2011-11-25: 30 ug/min via INTRAVENOUS
  Filled 2011-11-25 (×2): qty 1

## 2011-11-25 NOTE — Progress Notes (Signed)
T[facility] BRIEF PROGRESS NOTE   Events noted. Will continue to follow. Please keep in mind that systolic BP 85-90 mmHg is normal for [name] and she will likely remain fairly sensitive to volume administration. Please let me know if I can be of further assistance.  Michaela [name] H 11/25/2011 6:15 PM

## 2011-11-25 NOTE — Procedures (Signed)
Intubation Procedure Note Michaela [name] [id number] 12/16/1955  Procedure: Intubation Indications: Respiratory insufficiency  Procedure Details Consent: none, emergent Time Out: Verified patient identification, verified procedure, site/side was marked, verified correct patient position, special equipment/implants available, medications/allergies/relevent history reviewed, required imaging and test results available.  Performed  Maximum sterile technique was used including antiseptics, cap, gloves, gown, hand hygiene and mask.  MAC    Evaluation Hemodynamic Status: BP stable throughout; O2 sats: stable throughout Patient's Current Condition: stable Complications: No apparent complications Patient did tolerate procedure well. Chest X-ray ordered to verify placement.  CXR: pending.   Michaela [name] 11/25/2011  wityh Dirk Dress NP

## 2011-11-25 NOTE — Progress Notes (Signed)
Pt' current rthythm Afib,ECG done, confirmed Afib RVR. Informed Dr. Margo Aye on call. Stated will come to see patient.

## 2011-11-25 NOTE — Procedures (Signed)
Arterial Catheter Insertion Procedure Note Michaela [name] [id number] 09-19-1955  No radials available  Procedure: Insertion of Arterial Catheter  Indications: Blood pressure monitoring  Procedure Details Consent: Risks of procedure as well as the alternatives and risks of each were explained to the (patient/caregiver).  Consent for procedure obtained. Time Out: Verified patient identification, verified procedure, site/side was marked, verified correct patient position, special equipment/implants available, medications/allergies/relevent history reviewed, required imaging and test results available.  Performed  Maximum sterile technique was used including antiseptics, cap, gloves, gown, hand hygiene, mask and sheet. Skin prep: Chlorhexidine; local anesthetic administered 20 gauge catheter was inserted into right femoral artery using the Seldinger technique.  Evaluation Blood flow good; BP tracing good. Complications: No apparent complications.   Nelda Bucks 11/25/2011

## 2011-11-25 NOTE — Progress Notes (Signed)
Dr. Margo Aye came to see patient. Placed new orders.

## 2011-11-25 NOTE — Progress Notes (Signed)
    Subjective:  Events noted. Chart reviewed. Pt now intubated and sedated.  Objective:  Vital Signs in the last 24 hours: Temp:  [95.1 F (35.1 C)-97.5 F (36.4 C)] 95.4 F (35.2 C) (03/18 1341) Pulse Rate:  [71-154] 71  (03/18 1547) Resp:  [11-25] 23  (03/18 1547) BP: (56-136)/(25-96) 88/57 mmHg (03/18 1547) SpO2:  [92 %-100 %] 100 % (03/18 1415) FiO2 (%):  [99.8 %-100 %] 100 % (03/18 1547) Weight:  [49.5 kg (109 lb 2 oz)] 49.5 kg (109 lb 2 oz) (03/18 0432)  Intake/Output from previous day: 03/17 0701 - 03/18 0700 In: 1700.6 [P.O.:120; I.V.:1280.6; IV Piggyback:300] Out: 110 [Urine:110]  Physical Exam: Intubated, sedated HEENT: normal Neck: left neck hematoma noted Lungs: CTA bilaterally CV: RRR without murmur or gallop Abd: soft, mild distention Ext: 1+ bilateral pretibial edema Skin: cool/dry no rash   Lab Results:  Basename 11/25/11 0535 11/24/11 1003  WBC 47.1* 29.3*  HGB 11.3* 11.7*  PLT 71* 72*    Basename 11/25/11 1520  NA 134*  K 3.3*  CL 103  CO2 23  GLUCOSE 122*  BUN 8  CREATININE 0.37*   No results found for this basename: TROPONINI:2,CK,MB:2 in the last 72 hours  Tele: sinus rhythm  Assessment/Plan:  1. Paroxysmal atrial fibrillation 2. Acute respiratory failure 3. C Dif Colitis 4. S/P mitral valve repair  Will restart amiodarone and hold beta-blocker since patient is in shock and requiring IV inotropes. She is in sinus rhythm but at very high risk of recurrent atrial fib and will be best to maintain sinus rhythm if at all possible.  Tonny Bollman, M.D. 11/25/2011, 4:17 PM

## 2011-11-25 NOTE — Procedures (Signed)
Central Venous Catheter Insertion Procedure Note Michaela [name] [id number] 05/02/1956  Procedure: Insertion of Central Venous Catheter Indications: Assessment of intravascular volume, Drug and/or fluid administration and Frequent blood sampling  Procedure Details Consent: Risks of procedure as well as the alternatives and risks of each were explained to the (patient/caregiver).  Consent for procedure obtained. Time Out: Verified patient identification, verified procedure, site/side was marked, verified correct patient position, special equipment/implants available, medications/allergies/relevent history reviewed, required imaging and test results available.  Performed  Maximum sterile technique was used including antiseptics, cap, gloves, gown, hand hygiene, mask and sheet. Skin prep: Chlorhexidine; local anesthetic administered Real time Ultrasound was used to both identify and cannulate the blood vessel for this procedure  A antimicrobial bonded/coated triple lumen catheter was placed in the left internal jugular vein using the Seldinger technique.  Evaluation Blood flow good Complications: No apparent complications Patient did tolerate procedure well. Chest X-ray ordered to verify placement.  CXR: pending.  BABCOCK,PETE 11/25/2011, 2:41 PM   egtd needed Consent sister Michaela [name]. Michaela Alias, MD, FACP Pgr: [phone] [facility]

## 2011-11-25 NOTE — Progress Notes (Signed)
CRITICAL VALUE ALERT  Critical value received:  PT/INR  Date of notification: 11/25/11 Time of notification:  0650  Critical value read back:PT 66.6, INR 7.79  Nurse who received alert:  B. Evonnie Dawes  MD notified (1st page): Claiborne Billings (NP)  Time of first ONGE:9528 MD notified (2nd page): Responding MD:  Claiborne Billings (NP)  Time MD responded:  [phone]

## 2011-11-25 NOTE — Consult Note (Addendum)
Urology Consult  CC: Genital swelling  HPI: 56 year old female in critical condition believed to be in septic shock due to C.diff. She had open heart surgery at the beginning of the year which went well and she was in rehab when she became ill. She was recently intubated.  GU is consulted due to inability to place a foley catheter.  This is because of swelling of the genitals.  She has anasarca.  Her sister is present to give a history.  Patient is confined to a wheelchair due to history of brain tumor.  She does not see a urologist on a regular basis and has no history of urinary retention or GU surgery. Urine culture from 11-23-11 negative for growth.  CT abdomen/pelvis angiography in Janurary 2013 showed no urinary stones, but this is not the ideal test for stone detection.  A foley catheter was placed using sterile technique with nurses flexing the patient's hips.   PMH: Past Medical History  Diagnosis Date  . 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 MVP (mitral valve prolapse)   . Mitral regurgitation   . Shortness of breath   . Heart murmur   . Neuromuscular disorder     numbness in hands and feet  . Arthritis   . Overactive bladder   . Pleural effusion, left 11/11/2011    PSH: Past Surgical History  Procedure Date  . Ventriculoperitoneal shunt 1971  . Knee arthroscopy     right  . Cardiac catheterization     Jan 2013  . Transesophageal echocardiogram 7/12  . US echocardiography 6/12  . Mitral valve repair 10/15/2011    Procedure: MINIMALLY INVASIVE MITRAL VALVE REPAIR (MVR);  Surgeon: Purcell Nails, MD;  Location: [facility] OR;  Service: Open Heart Surgery;  Laterality: Right;    Allergies: Allergies  Allergen Reactions  . Compazine Other (See Comments)    "eyes get buggy"    Medications: Prescriptions prior to admission  Medication Sig Dispense Refill  . folic acid (FOLVITE) 1 MG tablet Take 1 mg by mouth daily.      Marland Kitchen imipramine (TOFRANIL) 25 MG  tablet Take 25 mg by mouth at bedtime.       Marland Kitchen levofloxacin (LEVAQUIN) 500 MG tablet Take 500 mg by mouth daily. For 14 days Started 3/6      . loperamide (IMODIUM) 2 MG capsule Take 2 mg by mouth 4 (four) times daily as needed. After loose stools      . metoprolol tartrate (LOPRESSOR) 12.5 mg TABS Take 12.5 mg by mouth 2 (two) times daily.      . metroNIDAZOLE (FLAGYL) 500 MG tablet Take 500 mg by mouth 3 (three) times daily.      . polysaccharide iron (NIFEREX) 150 MG CAPS capsule Take 1 capsule (150 mg total) by mouth daily.  30 each  0  . potassium chloride SA (K-DUR,KLOR-CON) 20 MEQ tablet Take 20 mEq by mouth 2 (two) times daily.      . pravastatin (PRAVACHOL) 80 MG tablet Take 80 mg by mouth daily.      . Somatropin (NORDITROPIN FLEXPRO) 10 MG/1.5ML SOLN Inject 0.5 mg into the skin every evening.      . tolterodine (DETROL LA) 4 MG 24 hr capsule Take 4 mg by mouth daily.       . traMADol (ULTRAM) 50 MG tablet Take 50 mg by mouth every 6 (six) hours as needed. For pain.  Social History: History   Social History  . Marital Status: Single    Spouse Name: N/A    Number of Children: N/A  . Years of Education: N/A   Occupational History  . Not on file.   Social History Main Topics  . Smoking status: Former Smoker    Quit date: 09/09/1990  . Smokeless tobacco: Not on file  . Alcohol Use: Yes  . Drug Use: No  . Sexually Active: Not on file   Other Topics Concern  . Not on file   Social History Narrative  . No narrative on file    Family History: No family history on file.  Review of Systems: Unable to obtain due to patient being intubated and sedated.   Physical Exam:  General: Intubated. Sedated Head:  Normocephalic.  Atraumatic. ENT:  Mucous membranes moist.  Neck:  Supple.  No lymphadenopathy. Pulmonary: Intubated. On ventilator.    Abdomen: Distended.   Skin:  Anasarca.   Extremity: No gross deformity of bilateral upper extremities.  No gross  deformity of    bilateral lower extremities. Neurologic: Sedated.  Genitals: Edema of labia majora & minora.  Studies:  Recent Labs  Basename 11/25/11 0535 11/24/11 1003   HGB 11.3* 11.7*   WBC 47.1* 29.3*   PLT 71* 72*    No results found for this basename: NA:2,K:2,CL:2,CO2:2,BUN:2,CREATININE:2,CALCIUM:2,MAGNESIUM:2,GFRNONAA:2,GFRAA:2 in the last 72 hours   Recent Labs  Basename 11/25/11 0535 11/24/11 1003   INR 7.79* 3.66*   APTT -- --     No components found with this basename: ABG:2    Assessment:  Genital edema due to anasarca.  Plan: -Catheter placed with ease. -Will defer to primary team on when to discontinue. -If there is concern for urosepsis, would obtain a CT abdomen/pelvis without contrast. -Please call with problems.    Pager: [phone]

## 2011-11-25 NOTE — Progress Notes (Signed)
Found to have significant altered mentation- obtunded with pursed lip breathing; limited air movement on auscultation. Persistent AFib with RVR despite IV amiodarone. Plain abdominal XR recently with ileus. Admitted with c. Diff colitis. More concerning is progressive profound leukocytosis which may be indicator of toxic megacolon. Also INR is supra-therapeutic at 8.0. RN reports noted during night to have some oral bleeding and have episodic hypoxic/respiratory distress that responded to breathing treatment. Also concerned about possibility of spontaneous intra-cranial bleeding.We have made her NPO, changed oral lopressor to IV and ordered stat ABG and PCXR. Also will reverse warfarin with 3 units FFP- pharmacy managing (mechanical heart valve) and antcipate will hold doses for now. PCCM has been made aware that patient likely needs urgent intubation.  Junious Silk, ANP [phone]

## 2011-11-25 NOTE — Progress Notes (Signed)
ANTIBIOTIC CONSULT NOTE - INITIAL  Pharmacy Consult for Ceftazidime Indication: r/o pna  Allergies  Allergen Reactions  . Compazine Other (See Comments)    "eyes get buggy"    Patient Measurements: Height: 4\' 11"  (149.9 cm) Weight: 109 lb 2 oz (49.5 kg) IBW/kg (Calculated) : 43.2   Vital Signs: Temp: 97.4 F (36.3 C) (03/18 0809) Temp src: Oral (03/18 0809) BP: 56/25 mmHg (03/18 0955) Pulse Rate: 87  (03/18 0809) Intake/Output from previous day: 03/17 0701 - 03/18 0700 In: 1700.6 [P.O.:120; I.V.:1280.6; IV Piggyback:300] Out: 110 [Urine:110] Intake/Output from this shift: Total I/O In: 282.6 [I.V.:282.6] Out: -   Labs:  Basename 11/25/11 0535 11/24/11 1003  WBC 47.1* 29.3*  HGB 11.3* 11.7*  PLT 71* 72*  LABCREA -- --  CREATININE -- --   Estimated Creatinine Clearance: 55.5 ml/min (by C-G formula based on Cr of 0.51).    Microbiology: Recent Results (from the past 720 hour(s))  BODY FLUID CULTURE     Status: Normal   Collection Time   11/11/11  1:00 PM      Component Value Range Status Comment   GRAM STAIN Rare   Final    GRAM STAIN WBC present-predominately Mononuclear   Final    GRAM STAIN No Organisms Seen   Final    Organism ID, Bacteria NO GROWTH 3 DAYS   Final   CULTURE, BLOOD (ROUTINE X 2)     Status: Normal (Preliminary result)   Collection Time   11/21/11  1:00 PM      Component Value Range Status Comment   Specimen Description BLOOD HAND RIGHT   Final    Special Requests BOTTLES DRAWN AEROBIC AND ANAEROBIC 5CC   Final    Culture  Setup Time [id number]   Final    Culture     Final    Value:        BLOOD CULTURE RECEIVED NO GROWTH TO DATE CULTURE WILL BE HELD FOR 5 DAYS BEFORE ISSUING A FINAL NEGATIVE REPORT   Report Status PENDING   Incomplete   CULTURE, BLOOD (ROUTINE X 2)     Status: Normal (Preliminary result)   Collection Time   11/21/11  1:09 PM      Component Value Range Status Comment   Specimen Description BLOOD ARM RIGHT   Final    Special Requests BOTTLES DRAWN AEROBIC ONLY 4CC   Final    Culture  Setup Time [id number]   Final    Culture     Final    Value:        BLOOD CULTURE RECEIVED NO GROWTH TO DATE CULTURE WILL BE HELD FOR 5 DAYS BEFORE ISSUING A FINAL NEGATIVE REPORT   Report Status PENDING   Incomplete   STOOL CULTURE     Status: Normal   Collection Time   11/21/11  4:06 PM      Component Value Range Status Comment   Specimen Description STOOL   Final    Special Requests NONE   Final    Culture     Final    Value: NO SALMONELLA, SHIGELLA, CAMPYLOBACTER, OR YERSINIA ISOLATED     Note: REDUCED NORMAL FLORA PRESENT   Report Status 11/25/2011 FINAL   Final   CLOSTRIDIUM DIFFICILE BY PCR     Status: Abnormal   Collection Time   11/21/11  4:06 PM      Component Value Range Status Comment   C difficile by pcr POSITIVE (*) NEGATIVE  Final  URINE CULTURE     Status: Normal   Collection Time   11/21/11  4:50 PM      Component Value Range Status Comment   Specimen Description URINE, CATHETERIZED   Final    Special Requests NONE   Final    Culture  Setup Time [id number]   Final    Colony Count NO GROWTH   Final    Culture NO GROWTH   Final    Report Status 11/23/2011 FINAL   Final   MRSA PCR SCREENING     Status: Normal   Collection Time   11/21/11 10:27 PM      Component Value Range Status Comment   MRSA by PCR NEGATIVE  NEGATIVE  Final     Medical History: Past Medical History  Diagnosis Date  . 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 MVP (mitral valve prolapse)   . Mitral regurgitation   . Shortness of breath   . Heart murmur   . Neuromuscular disorder     numbness in hands and feet  . Arthritis   . Overactive bladder   . Pleural effusion, left 11/11/2011    Antibiotics:   Flagyl (CDiff) 3/14>>>  Vanc (oral - CDiff) 3/14>>>  Vanc (enema-Cdiff) 3/18>>>  Ceftaz (?asp PNA) 3/18>>>  Assessment: 56 yo F admitted 3/14 with c.diff and started on Vancomycin PO and Flagyl.   Today patient experienced acute worsening respiratory distress, hypotension and required intubation.  WBC has increased drastically to 47.1.  CCM has asked pharmacy to start Ceftaz for r/o asp PNA.  Starting on limited abx coverage in light of c.diff infection.    Goal of Therapy:  Renal adjustment of antibiotics  Plan:  Continue Ceftaz 1gm IV Q8h as ordered by MD.  Dose is appropriate for patient's renal function at this time.  Will continue to follow patient clinical progress and culture data.  [employer], Pharm.D., BCPS Clinical Pharmacist Pager [phone]  11/25/2011,11:20 AM

## 2011-11-25 NOTE — Progress Notes (Signed)
Utilization Review Completed.Manasvi Dickard T3/18/2013   

## 2011-11-25 NOTE — Consult Note (Signed)
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  She was admitted 3/14 by [facility] with CDiff colitis and hypotension, PCCM was consulted. Hypotension initially improved with fluid resuscitation and PCCM signed off.  On 3/14 developed worsening hypotension, obtundation and resp failure and PCCM called back to assume ICU care.   Lines / Drains: ETT 3/18>>>  Cultures: Urine 3/14>>> neg BCx2 3/14>>>neg CDiff 3/14>>> POS Stool 3/14>>>neg BCx2 3/18>>> Urine 3/18>>>  Antibiotics: Flagyl (CDiff) 3/14>>> Vanc (oral - CDiff) 3/14>>> Vanc (enema-Cdiff) 3/18>>> Ceftaz (?asp PNA) 3/18>>>  Tests / Events: 3/14 resp failure, hypotension, intubated   Past Medical History  Diagnosis Date  . 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 MVP (mitral valve prolapse)   . Mitral regurgitation   . Shortness of breath   . Heart murmur   . Neuromuscular disorder     numbness in hands and feet  . Arthritis   . Overactive bladder   . Pleural effusion, left 11/11/2011   Past Surgical History  Procedure Date  . Ventriculoperitoneal shunt 1971  . Knee arthroscopy     right  . Cardiac catheterization     Jan 2013  . Transesophageal echocardiogram 7/12  . US echocardiography 6/12  . Mitral valve repair 10/15/2011    Procedure: MINIMALLY INVASIVE MITRAL VALVE REPAIR (MVR);  Surgeon: Purcell Nails, MD;  Location: [facility] OR;  Service: Open Heart Surgery;  Laterality: Right;   Prior to Admission medications   Medication Sig Start Date End Date Taking? Authorizing Provider  folic acid (FOLVITE) 1 MG tablet Take 1 mg by mouth daily. 10/27/11 10/26/12 Yes Wilmon Pali, PA  imipramine (TOFRANIL) 25 MG tablet Take 25 mg by mouth at bedtime.    Yes Historical Provider, MD  levofloxacin  (LEVAQUIN) 500 MG tablet Take 500 mg by mouth daily. For 14 days Started 3/6   Yes Historical Provider, MD  loperamide (IMODIUM) 2 MG capsule Take 2 mg by mouth 4 (four) times daily as needed. After loose stools   Yes Historical Provider, MD  metoprolol tartrate (LOPRESSOR) 12.5 mg TABS Take 12.5 mg by mouth 2 (two) times daily. 11/13/11  Yes [name], MD  metroNIDAZOLE (FLAGYL) 500 MG tablet Take 500 mg by mouth 3 (three) times daily.   Yes Historical Provider, MD  polysaccharide iron (NIFEREX) 150 MG CAPS capsule Take 1 capsule (150 mg total) by mouth daily. 10/27/11  Yes Wilmon Pali, PA  potassium chloride SA (K-DUR,KLOR-CON) 20 MEQ tablet Take 20 mEq by mouth 2 (two) times daily.   Yes Historical Provider, MD  pravastatin (PRAVACHOL) 80 MG tablet Take 80 mg by mouth daily.   Yes Historical Provider, MD  Somatropin (NORDITROPIN FLEXPRO) 10 MG/1.5ML SOLN Inject 0.5 mg into the skin every evening.   Yes Historical Provider, MD  tolterodine (DETROL LA) 4 MG 24 hr capsule Take 4 mg by mouth daily.    Yes Historical Provider, MD  traMADol (ULTRAM) 50 MG tablet Take 50 mg by mouth every 6 (six) hours as needed. For pain.   Yes Historical Provider, MD   Allergies Allergies  Allergen Reactions  . Compazine Other (See Comments)    "eyes get buggy"    Family History No family history on file.  Social  History  reports that she quit smoking about 21 years ago. She does not have any smokeless tobacco history on file. She reports that she drinks alcohol. She reports that she does not use illicit drugs.  Review Of Systems  Unable - pt obtunded   Vital Signs: Temp:  [97.1 F (36.2 C)-97.6 F (36.4 C)] 97.4 F (36.3 C) (03/18 0809) Pulse Rate:  [87-154] 87  (03/18 0809) Resp:  [11-25] 24  (03/18 0923) BP: (99-136)/(49-96) 108/96 mmHg (03/18 0923) SpO2:  [92 %-100 %] 93 % (03/18 0923) Weight:  [109 lb 2 oz (49.5 kg)] 109 lb 2 oz (49.5 kg) (03/18 0432) I/O last 3 completed shifts: In:  3017.7 [P.O.:120; I.V.:2597.7; IV Piggyback:300] Out: 360 [Urine:360]  Physical Examination:  General: chronically ill appearing, NAD HEENT: mm dry Neuro: obtunded, minimal response to painful stimuli Lungs: resps shallow, labored, diminished throughout  Cards: s1s2 ireg, no m/r/g Abd: firm, distended, -bs Ext: warm and dry, no edema    Ventilator settings:    Labs and Imaging:   CBC    Component Value Date/Time   WBC 47.1* 11/25/2011 0535   RBC 4.08 11/25/2011 0535   HGB 11.3* 11/25/2011 0535   HCT 33.6* 11/25/2011 0535   PLT 71* 11/25/2011 0535   MCV 82.4 11/25/2011 0535   MCH 27.7 11/25/2011 0535   MCHC 33.6 11/25/2011 0535   RDW 17.1* 11/25/2011 0535   LYMPHSABS 0.8 11/22/2011 0600   MONOABS 1.9* 11/22/2011 0600   EOSABS 0.2 11/22/2011 0600   BASOSABS 0.0 11/22/2011 0600    BMET    Component Value Date/Time   NA 137 11/22/2011 0600   K 3.5 11/22/2011 0600   CL 108 11/22/2011 0600   CO2 20 11/22/2011 0600   GLUCOSE 86 11/22/2011 0600   BUN 11 11/22/2011 0600   CREATININE 0.51 11/22/2011 0600   CREATININE 0.55 11/11/2011 1414   CALCIUM 7.1* 11/22/2011 0600   GFRNONAA >90 11/22/2011 0600   GFRAA >90 11/22/2011 0600     Lab 11/25/11 0535  INR 7.79*    ABG    Component Value Date/Time   PHART 7.373 11/25/2011 0916   PCO2ART 34.7* 11/25/2011 0916   PO2ART 54.0* 11/25/2011 0916   HCO3 19.9* 11/25/2011 0916   TCO2 21.0 11/25/2011 0916   ACIDBASEDEF 4.6* 11/25/2011 0916   O2SAT 91.3 11/25/2011 0916    Dg Chest Port 1 View  11/25/2011  *RADIOLOGY REPORT*  Clinical Data: Decreased mental status.  Leukocytosis.  PORTABLE CHEST - 1 VIEW  Comparison: 11/21/2011.  Findings: Post mitral annular/valve replacement.  Heart size top normal.  Radiopaque structure right upper quadrant unchanged.  Etiology indeterminate.  Gas distended stomach and colon.  Interval development of asymmetric air space disease greater on the right, this may represent asymmetric infectious infiltrate given the  patient's history of leukocytosis.  Asymmetric pulmonary edema is also a consideration.  Small left-sided pleural effusion suspected.  Elevated right hemidiaphragm limiting evaluation of the possibility of right-sided pleural effusion or right base mass.  IMPRESSION: Interval development of asymmetric air space disease greater on the right, this may represent an asymmetric infectious infiltrate given the patient's history of leukocytosis.  Asymmetric pulmonary edema is also a consideration.  Original Report Authenticated By: Fuller Canada, M.D.   Dg Abd Portable 2v  11/24/2011  *RADIOLOGY REPORT*  Clinical Data: Decreased bowel sounds with distended abdomen.  This  ABDOMEN PORTABLE 2 VIEW  Comparison: Abdomen radiograph 11/21/2011  Findings:  Gaseous distention of bowel loops is slightly  decreased compared to study of 11/21/2011.  An ileus pattern persist.  Gas is seen in the rectum.  No significant stool burden.  No abdominal mass effect.  Suggestion of callus formation of the distal right eleventh rib, suspect a subacute or remote fracture.  There is suggestion of subcutaneous edema of the body wall adjacent to the iliac bones.  IMPRESSION: Ileus pattern.  Overall, slight improvement compared to 11/21/2011. Definite rectal gas is seen on today's study, Making a distal colonic obstruction unlikely.  Original Report Authenticated By: Britta Mccreedy, M.D.      Assessment and Plan:  Acute Respiratory failure - in setting septic shock, abd distension and likely aspiration PNA.  PLAN -  Intubate STAT Vent support - Vt=400, aret 16, 100% peep 5 F/u abg F/u cxr STAT abx - see below, concern asp PNA rt , see prior pcxr   CDiff colitis -- worsening, with shock. ?toxic megacolon.  PLAN -  Increase po vanc Add vanc enema Consider IVIG if shock worsens and requires pressors or lactic acid acutely eleavted CT abd/pelvis STAT oral contrast, assess ischemic component Consider surgical consult if clinical  status worsens  Npo LFt now  Aspiration PNA--  PLAN -  ceftaz monotherapy, limit course in setting cdiff sputum F/u CXR   Hypotension/ septic shock -- in setting CDiff colitis. WBC 47k. BP improved with fluids PLAN -  Lactate, pct Hold on sepsis protocol for now with normotensive, await review lactic acid volume Will need CVL post FFP   Encephalopathy -- likely TME r/t sepsis PLAN -  Check CT head in setting coagulopathy  Coagulopathy - in setting coumadin for Afib / sepsis PLAN -  FFP STAT then line Vit K x1 STAT F/u INR  Afib rvr - Cards following.  Rate improved post intubation.  PLAN -  Cont amio per cards as tol D/c anticoagulation for now Treat resp failure, ssepsis If shock, Phenylphrine first choice  Chronic diastolic CHF/ mitral regurg - Cards following.  EF55-60% on echo 10/2011 PLAN -  Monitor CVP once CVL placed  HR control  Hyponatremia -  PLAN -  F/u chem now, none since 15th R/o renal failure etc NS, likley will need bolus, needs cvp  Anemia --  PLAN -  F/u cbc Concerning with coagulopathy but lab values dated CT head PRBC for hgb<7   Best practices / Disposition: -->ICU status under PCCM -->full code -->SCD's for DVT Px -->Protonix for GI Px -->ventilator bundle   WHITEHEART,KATHRYN, NP 11/25/2011  9:45 AM Pager: [phone]  *Care during the described time interval was provided by me and/or other providers on the critical care team. I have reviewed this patient's available data, including medical history, events of note, physical examination and test results as part of my evaluation.   I have fully examined this patient and agree with above findings.    And edited in full and updated brother in law at bedside, await sister  Ccm 38 min   Mcarthur Rossetti. Tyson Alias, MD, FACP Pgr: [phone] [facility]

## 2011-11-25 NOTE — Progress Notes (Signed)
INITIAL ADULT NUTRITION ASSESSMENT Date: 11/25/2011   Time: 10:46 AM  Reason for Assessment: Low Braden/VDRF  ASSESSMENT: Female 56 y.o.  Dx: SIRS (systemic inflammatory response syndrome)  Hx:  Past Medical History  Diagnosis Date  . 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 MVP (mitral valve prolapse)   . Mitral regurgitation   . Shortness of breath   . Heart murmur   . Neuromuscular disorder     numbness in hands and feet  . Arthritis   . Overactive bladder   . Pleural effusion, left 11/11/2011   Related Meds:     . amiodarone  150 mg Intravenous Once  . cefTAZidime (FORTAZ)  IV  1 g Intravenous Q8H  . etomidate      . famotidine (PEPCID) IV  20 mg Intravenous Q24H  . fentaNYL      . insulin aspart  0-9 Units Subcutaneous Q4H  . lidocaine (cardiac) 100 mg/55ml      . metoprolol  2.5 mg Intravenous Once  . metoprolol  5 mg Intravenous Q4H  . metronidazole  500 mg Intravenous Q6H  . midazolam      . phytonadione (VITAMIN K) IV  5 mg Intravenous Once  . rocuronium      . sodium chloride  3 mL Intravenous Q12H  . succinylcholine      . vancomycin  250 mg Oral Q6H  . vancomycin (VANCOCIN) rectal enema  500 mg Rectal Q6H  . DISCONTD: famotidine  20 mg Oral BID  . DISCONTD: folic acid  1 mg Oral Daily  . DISCONTD: imipramine  25 mg Oral QHS  . DISCONTD: metoprolol  2.5 mg Intravenous Q6H  . DISCONTD: metoprolol tartrate  25 mg Oral Q6H  . DISCONTD: metronidazole  500 mg Intravenous Q8H  . DISCONTD: polysaccharide iron  150 mg Oral Daily  . DISCONTD: potassium chloride SA  20 mEq Oral BID  . DISCONTD: pravastatin  80 mg Oral q1800  . DISCONTD: saccharomyces boulardii  250 mg Oral BID  . DISCONTD: simethicone  80 mg Oral QID  . DISCONTD: tolterodine  4 mg Oral Daily  . DISCONTD: vancomycin  125 mg Oral Q6H  . DISCONTD: Warfarin - Pharmacist Dosing Inpatient   Does not apply q1800   Ht: 4\' 11"  (149.9 cm)  Wt: 109 lb 2 oz (49.5 kg) - likely  artificially high 2/2 fluids Admit wt: 80 lb (36.3 kg)  Ideal Wt: 43.2 kg  % Ideal Wt: 115%  Wt Readings from Last 10 Encounters:  11/25/11 109 lb 2 oz (49.5 kg)  11/20/11 80 lb 1.9 oz (36.342 kg)  11/13/11 88 lb (39.917 kg)  10/29/11 85 lb 9.6 oz (38.828 kg)  10/29/11 85 lb 9.6 oz (38.828 kg)  10/11/11 86 lb 6.4 oz (39.191 kg)  10/04/11 83 lb (37.649 kg)  09/18/11 88 lb (39.917 kg)  09/18/11 88 lb (39.917 kg)  09/18/11 88 lb (39.917 kg)  Usual Wt: 40 kg % Usual Wt: 91%  BMI 16.2 (using admit wt of 36.3 kg) - pt is underweight.  Food/Nutrition Related Hx:  Poor PO intake since discharge from hospital; significantly poor PO intake x 6 days, per sister.  Labs:  CMP     Component Value Date/Time   NA 137 11/22/2011 0600   K 3.5 11/22/2011 0600   CL 108 11/22/2011 0600   CO2 20 11/22/2011 0600   GLUCOSE 86 11/22/2011 0600   BUN 11 11/22/2011 0600   CREATININE 0.51  11/22/2011 0600   CREATININE 0.55 11/11/2011 1414   CALCIUM 7.1* 11/22/2011 0600   PROT 4.4* 11/22/2011 0600   ALBUMIN 1.4* 11/22/2011 0600   AST 19 11/22/2011 0600   ALT 12 11/22/2011 0600   ALKPHOS 96 11/22/2011 0600   BILITOT 0.2* 11/22/2011 0600   GFRNONAA >90 11/22/2011 0600   GFRAA >90 11/22/2011 0600  Magnesium 1.7 WNL Phosphorus - pending  Intake/Output: I/O last 3 completed shifts: In: 3117.7 [P.O.:120; I.V.:2697.7; IV Piggyback:300] Out: 360 [Urine:360] Total I/O In: 282.6 [I.V.:282.6] Out: -   Diet Order:   NPO  Supplements/Tube Feeding:  none  IVF:    sodium chloride Last Rate: 20 mL/hr at 11/25/11 1000  amiodarone (NEXTERONE PREMIX) 360 mg/200 mL dextrose Last Rate: 0.5 mg/min (11/24/11 1938)   Estimated Nutritional Needs:   Kcal: 1036 kcal Protein:  68 - 78 grams Fluid:  1 - 1.2 L/d  RD drawn to chart 2/2 low braden. Current braden score of 12.   Admitted from [facility] with diarrhea x 5-7 days and AMS; 8 lb weight loss within past week, per sister and cardiology visit on 3/13. Per weight  history, pt has had at least 8 lb weight loss since January hospitalization. This is a weight loss of 9% x 1 months. Sister reports pt has not been eating well since admitted to [facility]. Noted appetite has significantly declined since Tuesday of last week. Sister reports pt has had "basically nothing" to eat since last Tuesday. Pt meets criteria for severe malnutrition in the context of acute illness 2/2 9% wt loss within 1 month, < 50% estimated energy intake for at least 5 days. Unable to physically assess pt at this time; sister reports pt appears "bloated", concurrent with artificially high weight of 109 lb. Pt is at risk for refeeding syndrome given the above evidence to support malnutrition dx.  Initially placed on clear liquids, pt was able to tolerate broth but appetite remained poor, per sister.  Emergently intubated 2/2 acute respiratory failure.  NUTRITION DIAGNOSIS: -Inadequate oral intake (NI-2.1).  Status: Ongoing  RELATED TO: inability to eat  AS EVIDENCE BY: NPO status.  MONITORING/EVALUATION(Goals): Goal: If unable to extubate within 24 - 48 hours, initiate nutrition support. Nutrition support to provide >90% of protein and kcal needs. Monitor: Extubation, initiation of nutrition support, weights, labs, I/O's  EDUCATION NEEDS: -Education not appropriate at this time  INTERVENTION: 1. If unable to extubate within 24 - 48 hours, recommend initiation of Oxepa formula. Initiate Oxepa at 15 ml/hr via enteral feeding tube, advance by 10 ml/hr every 8 hours (slow advancement 2/2 risk for refeeding syndrom) to goal of 25 ml/hr. 30 ml Prostat via tube BID. Total TF regimen will provide: 1044 kcal, 68 grams protein, 471 ml free water. 2. Continue to monitor magnesium, phosphorus, and magnesium; replete prn. 3. RD to follow nutrition care plan  Dietitian #: [phone]  DOCUMENTATION CODES Per approved criteria  -Severe malnutrition in the context of acute illness or  injury -Underweight    Adair Laundry 11/25/2011, 10:46 AM

## 2011-11-25 NOTE — Progress Notes (Signed)
eLink Physician-Brief Progress Note Patient Name: Michaela [name] DOB: Sep 19, 1955 MRN: [id number]  Date of Service  11/25/2011   HPI/Events of Note   CT studies reviewed:  CT Abd   C/w colitis.  Colon 6cm in diameter therefore no IgG IV given  CT head neg for bleed.  Pt needs sedation.  Orders for sedation protocol given  Amiodarone d/c with bradycardia  eICU Interventions  See orders    Intervention Category Major Interventions: Delirium, psychosis, severe agitation - evaluation and management Intermediate Interventions: Diagnostic test evaluation  Shan Levans 11/25/2011, 8:03 PM

## 2011-11-26 ENCOUNTER — Other Ambulatory Visit: Payer: Self-pay

## 2011-11-26 ENCOUNTER — Inpatient Hospital Stay (HOSPITAL_COMMUNITY): Payer: Medicare Other

## 2011-11-26 LAB — BASIC METABOLIC PANEL
Calcium: 5.7 mg/dL — CL (ref 8.4–10.5)
GFR calc non Af Amer: >90 mL/min (ref >90)
Sodium: 135 mEq/L (ref 135–145)

## 2011-11-26 LAB — CBC
Hemoglobin: 8.4 g/dL — ABNORMAL LOW (ref 12.0–15.0)
Platelets: 77 10*3/uL — ABNORMAL LOW (ref 150–400)
RBC: 3.09 MIL/uL — ABNORMAL LOW (ref 3.87–5.11)
WBC: 32.5 10*3/uL — ABNORMAL HIGH (ref 4.0–10.5)

## 2011-11-26 LAB — COMPREHENSIVE METABOLIC PANEL
ALT: 11 U/L (ref 0–35)
AST: 22 U/L (ref 0–37)
Alkaline Phosphatase: 81 U/L (ref 39–117)
CO2: 20 mEq/L (ref 19–32)
Calcium: 6.8 mg/dL — ABNORMAL LOW (ref 8.4–10.5)
Chloride: 103 mEq/L (ref 96–112)
GFR calc Af Amer: >90 mL/min (ref >90)
GFR calc non Af Amer: >90 mL/min (ref >90)
Glucose, Bld: 85 mg/dL (ref 70–99)
Potassium: 2.9 mEq/L — ABNORMAL LOW (ref 3.5–5.1)
Sodium: 133 mEq/L — ABNORMAL LOW (ref 135–145)

## 2011-11-26 LAB — URINE CULTURE: Colony Count: NO GROWTH

## 2011-11-26 LAB — BLOOD GAS, ARTERIAL
Bicarbonate: 22.6 mEq/L (ref 20.0–24.0)
MECHVT: 300 mL
O2 Saturation: 98.3 %
PEEP: 5 cmH2O
Patient temperature: 98.6
RATE: 24 resp/min

## 2011-11-26 LAB — GLUCOSE, CAPILLARY
Glucose-Capillary: 82 mg/dL (ref 70–99)
Glucose-Capillary: 88 mg/dL (ref 70–99)
Glucose-Capillary: 92 mg/dL (ref 70–99)
Glucose-Capillary: 93 mg/dL (ref 70–99)
Glucose-Capillary: 95 mg/dL (ref 70–99)

## 2011-11-26 LAB — PROTIME-INR
INR: 1.54 — ABNORMAL HIGH (ref 0.00–1.49)
Prothrombin Time: 18.8 seconds — ABNORMAL HIGH (ref 11.6–15.2)

## 2011-11-26 LAB — POCT I-STAT 3, ART BLOOD GAS (G3+)
Bicarbonate: 20.1 mEq/L (ref 20.0–24.0)
Patient temperature: 98.6
pCO2 arterial: 40 mmHg (ref 35.0–45.0)
pH, Arterial: 7.309 — ABNORMAL LOW (ref 7.350–7.400)

## 2011-11-26 LAB — PREPARE FRESH FROZEN PLASMA
Unit division: 0
Unit division: 0

## 2011-11-26 LAB — LEGIONELLA ANTIGEN, URINE

## 2011-11-26 LAB — CORTISOL: Cortisol, Plasma: 21.6 ug/dL

## 2011-11-26 MED ORDER — OXEPA PO LIQD
1000.0000 mL | ORAL | Status: DC
  Filled 2011-11-26 (×2): qty 1000

## 2011-11-26 MED ORDER — FUROSEMIDE 10 MG/ML IJ SOLN
20.0000 mg | INTRAMUSCULAR | Status: DC | PRN
  Administered 2011-11-26 – 2011-11-27 (×5): 20 mg via INTRAVENOUS
  Administered 2011-11-28: 40 mg via INTRAVENOUS
  Administered 2011-11-28 (×3): 20 mg via INTRAVENOUS
  Administered 2011-11-28: 40 mg via INTRAVENOUS
  Administered 2011-11-29 (×2): 20 mg via INTRAVENOUS
  Administered 2011-11-29: 40 mg via INTRAVENOUS
  Filled 2011-11-26 (×3): qty 8

## 2011-11-26 MED ORDER — POTASSIUM CHLORIDE 10 MEQ/50ML IV SOLN
INTRAVENOUS | Status: AC
  Administered 2011-11-26: 10 meq via INTRAVENOUS
  Filled 2011-11-26: qty 350

## 2011-11-26 MED ORDER — SODIUM CHLORIDE 0.9 % IV SOLN
1.0000 g | Freq: Once | INTRAVENOUS | Status: AC
  Administered 2011-11-26: 1 g via INTRAVENOUS
  Filled 2011-11-26: qty 10

## 2011-11-26 MED ORDER — FAMOTIDINE 20 MG PO TABS
20.0000 mg | ORAL_TABLET | Freq: Every day | ORAL | Status: DC
  Administered 2011-11-27 – 2011-12-04 (×8): 20 mg via ORAL
  Filled 2011-11-26 (×8): qty 1

## 2011-11-26 MED ORDER — STUDY - INVESTIGATIONAL DRUG SIMPLE RECORD
40.0000 mg | Freq: Once | Status: AC
  Administered 2011-11-26: 40 mg via ORAL
  Filled 2011-11-26: qty 40

## 2011-11-26 MED ORDER — VANCOMYCIN 50 MG/ML ORAL SOLUTION
500.0000 mg | Freq: Four times a day (QID) | ORAL | Status: DC
  Administered 2011-11-26 – 2011-12-04 (×34): 500 mg via ORAL
  Filled 2011-11-26 (×36): qty 10

## 2011-11-26 MED ORDER — OXEPA PO LIQD
1000.0000 mL | ORAL | Status: DC
  Administered 2011-11-26: 1000 mL
  Filled 2011-11-26 (×5): qty 1000

## 2011-11-26 MED ORDER — FUROSEMIDE 10 MG/ML IJ SOLN
20.0000 mg | Freq: Four times a day (QID) | INTRAMUSCULAR | Status: AC
  Administered 2011-11-26 (×3): 20 mg via INTRAVENOUS
  Filled 2011-11-26 (×4): qty 2

## 2011-11-26 MED ORDER — STUDY - INVESTIGATIONAL DRUG SIMPLE RECORD
20.0000 mg | Freq: Every day | Status: DC
  Administered 2011-11-27 – 2011-12-03 (×7): 20 mg via ORAL
  Filled 2011-11-26 (×5): qty 20

## 2011-11-26 MED ORDER — HEPARIN SODIUM (PORCINE) 5000 UNIT/ML IJ SOLN
5000.0000 [IU] | Freq: Three times a day (TID) | INTRAMUSCULAR | Status: DC
  Administered 2011-11-26 – 2011-11-28 (×6): 5000 [IU] via SUBCUTANEOUS
  Filled 2011-11-26 (×9): qty 1

## 2011-11-26 MED ORDER — AMIODARONE HCL IN DEXTROSE 360-4.14 MG/200ML-% IV SOLN
60.0000 mg/h | INTRAVENOUS | Status: DC
  Administered 2011-11-26 – 2011-11-27 (×3): 30 mg/h via INTRAVENOUS
  Administered 2011-11-27: 150 mg/h via INTRAVENOUS
  Administered 2011-11-29: 60 mg/h via INTRAVENOUS
  Administered 2011-11-29: 30 mg/h via INTRAVENOUS
  Administered 2011-11-30 (×2): 60 mg/h via INTRAVENOUS
  Administered 2011-12-01: 30 mg/h via INTRAVENOUS
  Filled 2011-11-26 (×34): qty 200

## 2011-11-26 MED ORDER — POTASSIUM CHLORIDE 10 MEQ/50ML IV SOLN
10.0000 meq | INTRAVENOUS | Status: AC
  Administered 2011-11-26 (×7): 10 meq via INTRAVENOUS

## 2011-11-26 MED ORDER — MAGNESIUM SULFATE 40 MG/ML IJ SOLN
2.0000 g | Freq: Once | INTRAMUSCULAR | Status: AC
  Administered 2011-11-26: 2 g via INTRAVENOUS
  Filled 2011-11-26: qty 50

## 2011-11-26 MED ORDER — POTASSIUM PHOSPHATE DIBASIC 3 MMOLE/ML IV SOLN
30.0000 mmol | Freq: Once | INTRAVENOUS | Status: AC
  Administered 2011-11-26: 30 mmol via INTRAVENOUS
  Filled 2011-11-26: qty 10

## 2011-11-26 MED ORDER — MAGNESIUM SULFATE 50 % IJ SOLN: 2.0000 g | Freq: Once | INTRAVENOUS | Status: DC

## 2011-11-26 NOTE — Progress Notes (Signed)
Nutrition Follow-up/Consult to Initiate/Manage TF  RD completed full nutrition assessment yesterday; see 3/18 note. Remains intubated at this time. Pt remains at risk for refeeding syndrome given dx of severe malnutrition. Noted recent low phosphorus and potassium. Most recent magnesium WNL.  Oxepa formula ordered at this time. Per most recent MD note, pt to attempt trickle feeds, and avoid TPN. RN states TF to be initiated and held at 10 ml/hr; per MD.  Diet Order:  NPO  Meds: Scheduled Meds:   . sodium chloride   Intravenous Once  . antiseptic oral rinse  15 mL Mouth Rinse QID  . cefTAZidime (FORTAZ)  IV  1 g Intravenous Q8H  . chlorhexidine  15 mL Mouth Rinse BID  . famotidine (PEPCID) IV  20 mg Intravenous Q24H  . furosemide  20 mg Intravenous Q6H  . heparin subcutaneous  5,000 Units Subcutaneous Q8H  . insulin aspart  0-9 Units Subcutaneous Q4H  . iohexol  20 mL Oral Q1 Hr x 2  . iohexol  20 mL Oral Q1 Hr x 2  . lidocaine (cardiac) 100 mg/62ml      . magnesium sulfate 1 - 4 g bolus IVPB  2 g Intravenous Once  . metronidazole  500 mg Intravenous Q6H  . phytonadione (VITAMIN K) IV  5 mg Intravenous Once  . potassium phosphate IVPB (mmol)  30 mmol Intravenous Once  . rocuronium      . sodium chloride  500 mL Intravenous Once  . sodium chloride  3 mL Intravenous Q12H  . succinylcholine      . vancomycin  500 mg Oral Q6H  . vancomycin (VANCOCIN) rectal enema  500 mg Rectal Q6H  . DISCONTD: amiodarone  150 mg Intravenous Once  . DISCONTD: magnesium sulfate LVP 250-500 ml  2 g Intravenous Once  . DISCONTD: metoprolol  5 mg Intravenous Q4H  . DISCONTD: vancomycin  250 mg Oral Q6H   Continuous Infusions:   . sodium chloride 20 mL/hr at 11/25/11 1000  . amiodarone (NEXTERONE PREMIX) 360 mg/200 mL dextrose 30 mg/hr (11/26/11 0924)  . feeding supplement (OXEPA)    . DISCONTD: amiodarone (NEXTERONE PREMIX) 360 mg/200 mL dextrose 0.5 mg/min (11/25/11 1704)   PRN Meds:.acetaminophen,  alum & mag hydroxide-simeth, DOBUTamine, fentaNYL, levalbuterol, midazolam, norepinephrine (LEVOPHED) Adult infusion, ondansetron (ZOFRAN) IV, phenylephrine (NEO-SYNEPHRINE) Adult infusion, sodium chloride, traMADol, DISCONTD: acetaminophen, DISCONTD: ondansetron, DISCONTD: oxyCODONE  Labs:  CMP     Component Value Date/Time   NA 133* 11/26/2011 0440   K 2.9* 11/26/2011 0440   CL 103 11/26/2011 0440   CO2 20 11/26/2011 0440   GLUCOSE 85 11/26/2011 0440   BUN 5* 11/26/2011 0440   CREATININE 0.33* 11/26/2011 0440   CREATININE 0.55 11/11/2011 1414   CALCIUM 6.8* 11/26/2011 0440   PROT 4.1* 11/26/2011 0440   ALBUMIN 1.6* 11/26/2011 0440   AST 22 11/26/2011 0440   ALT 11 11/26/2011 0440   ALKPHOS 81 11/26/2011 0440   BILITOT 0.4 11/26/2011 0440   GFRNONAA >90 11/26/2011 0440   GFRAA >90 11/26/2011 0440  Phosphorus 1.9 L Magnesium 1.6 WNL   Intake/Output Summary (Last 24 hours) at 11/26/11 1109 Last data filed at 11/26/11 0924  Gross per 24 hour  Intake 6983.2 ml  Output   1310 ml  Net 5673.2 ml   Ht: 4\' 11"  (149.9 cm)  Weight Status:  58.8 kg Admit/Baseline wt: 36.3 kg  Re-estimated needs (using admit wt of 36.6 kg) Kcal: 972 kcal  Protein: 68 - 78 grams  Fluid: 1 - 1.2 L/d  Nutrition Dx:  Inadequate oral intake r/t inability to eat AEB NPO status. Ongoing.  Goal: If unable to extubate within 24 - 48 hours, initiate nutrition support. Met. Nutrition support to provide >90% of protein and kcal needs. Unmet.  Monitor: Extubation, initiation of nutrition support, weights, labs, I/O's  Intervention:   1. Initiate Oxepa at 10 ml/hr via enteral feeding tube per MD. Once able to advance per MD advance by 10 ml/hr every 12 hours (slow advancement 2/2 risk for refeeding syndrome) to goal of 25 ml/hr. 30 ml Prostat via tube BID. Total TF regimen will provide: 1044 kcal, 68 grams protein, 471 ml free water. 2. RD to follow nutrition care plan.  Adair Laundry Pager #:  [phone]

## 2011-11-26 NOTE — Progress Notes (Signed)
UR Completed.  Natelie Ostrosky Jane [phone] 11/26/2011  

## 2011-11-26 NOTE — Progress Notes (Signed)
eLink Physician-Brief Progress Note Patient Name: Michaela [name] DOB: 1956/02/17 MRN: [id number]  Date of Service  11/26/2011   HPI/Events of Note   Patient admitted with cdif colitis and has Afib with RVR.  Had been on amiodarone gtt earlier but d/ced secondary to bradycardia in the 40s.  HR now up to 110 to 130s.    eICU Interventions  Plan: Restart amiodarone at 30 mg   Intervention Category Intermediate Interventions: Arrhythmia - evaluation and management  Pria Klosinski,Madlyn 11/26/2011, 2:20 AM

## 2011-11-26 NOTE — Research (Signed)
[facility] research study discussed with patient's sister and mother.  All questions and concerns were addressed and answered prior to obtaining informed consent.  No study procedures were initiated prior to obtaining informed consent.  A copy of the signed consent was provided to patient's family.  Please contact CCM physician or study coordinator with any questions or concerns.   The patient is hemodynamically stable and will begin fluid management protocol per study.

## 2011-11-26 NOTE — Progress Notes (Signed)
Rhythm reviewed. Pt is maintaining sinus on amiodarone 0.5 mg/min IV. No further brady events or supraventricular arrhythmia. HR is 78 bpm and BP 122/62. R 18, O2 sat 100%. Notes of Dr Tyson Alias reviewed. Pt otherwise stable from CV perspective. Long discussion with her mother this morning. Will continue IV amio for now.

## 2011-11-26 NOTE — Progress Notes (Signed)
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  She was admitted 3/14 by [facility] with CDiff colitis and hypotension, PCCM was consulted. Hypotension initially improved with fluid resuscitation and PCCM signed off.  On 3/14 developed worsening hypotension, obtundation and resp failure and PCCM called back to assume ICU care.   Lines / Drains: ETT 3/18>>>  Cultures: Urine 3/14>>> neg BCx2 3/14>>>neg CDiff 3/14>>> POS Stool 3/14>>>neg BCx2 3/18>>> Urine 3/18>>> Sputum 3/19>>>  Antibiotics: Flagyl (CDiff) 3/14>>> Vanc (oral - CDiff) 3/14>>> Vanc (enema-Cdiff) 3/18>>> Ceftaz (?asp PNA) 3/18>>>  Tests / Events: 3/14 resp failure, hypotension, intubated 3.18 CT abdo/pelvis -Prominent wall thickening throughout most of the colon, which certainly could be a secondary finding from C. difficile colitis. Bilateral small pleural effusions with bibasilar consolidation. Extensive subcutaneous and mesenteric edema, with ascites.  4. Portions of the VP shunt may be discontinuous. 3/18 Ct head -Stable appearance of the posterior fossa and of the lateral  ventricles, with continued white matter hypodensities in the  parietal lobes.  2. Right lateral ventricular shunt noted.  3. No acute intracranial findings. 3/18 - ARDS 3/19 -off pressors  Vital Signs: Temp:  [95.1 F (35.1 C)-97.9 F (36.6 C)] 97.5 F (36.4 C) (03/19 0750) Pulse Rate:  [43-128] 79  (03/19 0900) Resp:  [13-31] 27  (03/19 0900) BP: (56-122)/(25-71) 122/62 mmHg (03/19 0750) SpO2:  [86 %-100 %] 100 % (03/19 0900) Arterial Line BP: (100-139)/(54-78) 106/54 mmHg (03/19 0900) FiO2 (%):  [59.5 %-100 %] 60.3 % (03/19 0900) Weight:  [50.4 kg (111 lb 1.8 oz)-58.8 kg (129 lb 10.1 oz)] 58.8 kg (129 lb 10.1 oz) (03/19  0500) I/O last 3 completed shifts: In: 8359.4 [I.V.:2704.9; Blood:902.5; NG/GT:1040; IV Piggyback:3712] Out: 1245 [Urine:1245]  Physical Examination:  General: chronically ill appearing, NAD HEENT: mm dry, line no longer oozing Neuro:follows commands well, rass -1 Lungs: ronchi rt greater left Cards: s1s2 ireg, no m/r/g Abd: firm, distended, -bs Ext: warm and dry, no edema    Ventilator settings: Vent Mode:  [-] PRVC FiO2 (%):  [59.5 %-100 %] 60.3 % Set Rate:  [18 bmp-26 bmp] 26 bmp Vt Set:  [300 mL-400 mL] 300 mL PEEP:  [5 cmH20-10.3 cmH20] 10.3 cmH20 Plateau Pressure:  [11 cmH20-31 cmH20] 11 cmH20  Labs and Imaging:   CBC    Component Value Date/Time   WBC 32.5* 11/26/2011 0440   RBC 3.09* 11/26/2011 0440   HGB 8.4* 11/26/2011 0440   HCT 25.6* 11/26/2011 0440   PLT 77* 11/26/2011 0440   MCV 82.8 11/26/2011 0440   MCH 27.2 11/26/2011 0440   MCHC 32.8 11/26/2011 0440   RDW 17.5* 11/26/2011 0440   LYMPHSABS 0.8 11/22/2011 0600   MONOABS 1.9* 11/22/2011 0600   EOSABS 0.2 11/22/2011 0600   BASOSABS 0.0 11/22/2011 0600    BMET    Component Value Date/Time   NA 133* 11/26/2011 0440   K 2.9* 11/26/2011 0440   CL 103 11/26/2011 0440   CO2 20 11/26/2011 0440   GLUCOSE 85 11/26/2011 0440   BUN 5* 11/26/2011 0440   CREATININE 0.33* 11/26/2011 0440   CREATININE 0.55 11/11/2011 1414   CALCIUM 6.8* 11/26/2011 0440   GFRNONAA >90 11/26/2011 0440   GFRAA >90 11/26/2011 0440     Lab 11/26/11 0440  INR 1.54*  ABG    Component Value Date/Time   PHART 7.309* 11/26/2011 0457   PCO2ART 40.0 11/26/2011 0457   PO2ART 96.0 11/26/2011 0457   HCO3 20.1 11/26/2011 0457   TCO2 21 11/26/2011 0457   ACIDBASEDEF 6.0* 11/26/2011 0457   O2SAT 97.0 11/26/2011 0457    Ct Abdomen Pelvis Wo Contrast  11/25/2011  *RADIOLOGY REPORT*  Clinical Data: Elevated INR.  No fever.  Query C difficile sepsis. Query ischemic bowel.  CT ABDOMEN AND PELVIS WITHOUT CONTRAST  Technique:  Multidetector CT imaging of the  abdomen and pelvis was performed following the standard protocol without intravenous contrast.  Comparison: 09/24/2011  Findings: Consolidation noted in both lower lobes, right greater than left, with air bronchograms and small bilateral pleural effusions.  Diffuse somewhat striking subcutaneous edema is present suggesting extensive third spacing of fluid.  No pericardial effusion noted.  Marked wall thickening of the colon extends from the mid ascending colon through the transverse, descending, and sigmoid colon to the rectum.  This certainly could represent C difficile colitis; IV contrast was not administered and accordingly assessment for enhancing pseudomembranes is not feasible.  The colon measures up to 6 cm in diameter.  No definite dilated small bowel is observed.  There appear to be portions of the ventricular peritoneal shunt which may be discontinuous, for example on image 13 of series 2.  Dependent density in the gallbladder could represent vicarious excretion of contrast medium or layering gallstones.  A nasogastric or feeding tube extends into the transverse duodenum. Scattered ascites and mesenteric edema noted.  The extensive subcutaneous edema is particularly prominent in the pelvis, with swelling of the upper thigh regions and in the labia. A Foley catheter is present in the urinary bladder.  A left femoral line is noted.  IMPRESSION:  1.  Prominent wall thickening throughout most of the colon, which certainly could be a secondary finding from C. difficile colitis. 2.  Bilateral small pleural effusions with bibasilar consolidation. 3.  Extensive subcutaneous and mesenteric edema, with ascites. 4.  Portions of the VP shunt may be discontinuous.  Original Report Authenticated By: Dellia Cloud, M.D.   Ct Head Wo Contrast  11/25/2011  *RADIOLOGY REPORT*  Clinical Data: Elevated INR, query intracranial bleeding. Remote astrocytoma at age 59.  CT HEAD WITHOUT CONTRAST  Technique:  Contiguous  axial images were obtained from the base of the skull through the vertex without contrast.  Comparison: 10/08/2005  Findings: Postoperative findings in the postop posterior fossa noted with cerebellar encephalomalacia.  Stable cystic appearance along the fourth ventricle noted, with posterior can cavity of portions of the pons and midbrain.  These findings appears similar to the prior MRI from 2007.  Right sided shunt noted with tip in the right lateral ventricle. Parietal white matter hypodensities are again noted.  No acute intracranial findings are observed.  No intracranial hemorrhage, mass lesion, or acute CVA noted.  IMPRESSION:  1.  Stable appearance of the posterior fossa and of the lateral ventricles, with continued white matter hypodensities in the parietal lobes. 2.  Right lateral ventricular shunt noted. 3.  No acute intracranial findings.  Original Report Authenticated By: Dellia Cloud, M.D.   Dg Chest Port 1 View  11/26/2011  *RADIOLOGY REPORT*  Clinical Data: Respiratory failure, evaluate endotracheal tube  PORTABLE CHEST - 1 VIEW  Comparison: 11/25/2011; 11/21/2011  Findings:  Grossly unchanged march cardiac silhouette and mediastinal contours given patient rotation.  Post valve replacement.  Stable positioning of support apparatus. Minimal  worsening of diffuse right-sided and left mid lung heterogeneous air space opacities. Grossly unchanged small bilateral effusions, right greater than left. Enteric contrast seen within the hepatic flexure of the colon.  Grossly unchanged bones.  IMPRESSION: 1.  Stable positioning of support apparatus.  No pneumothorax. 2.  Minimal worsening of bilateral air space opacities, right greater than left, worrisome for multifocal infection. 3.  Unchanged small bilateral effusions.  Original Report Authenticated By: Waynard Reeds, M.D.   Dg Chest Port 1 View  11/25/2011  *RADIOLOGY REPORT*  Clinical Data: Left internal jugular central line  PORTABLE CHEST  - 1 VIEW  Comparison: Same day  Findings: Endotracheal tube has its tip 3 cm above the carina. Nasogastric tube enters the abdomen and probably extends into the descending duodenum.  Left internal jugular central line has its tip either at the SVC/RA junction or within the right atrium. There has been previous valve replacement.  Bilateral pulmonary density has worsened, more on the right than the left.  Findings are most consistent with bronchopneumonia.  Bilateral effusions persist.  An element of pulmonary edema is not excluded.  IMPRESSION: Left internal jugular central line has its tip at the SVC/RA junction or within the right atrium.  No pneumothorax.  Persistent bilateral airspace density right worse than left with bilateral effusions.  Pneumonia is suspected, though there could be coexistent edema.  Original Report Authenticated By: Thomasenia Sales, M.D.   Dg Chest Port 1 View  11/25/2011  *RADIOLOGY REPORT*  Clinical Data: Intubated.  PORTABLE CHEST - 1 VIEW  Comparison: Earlier today.  Findings: Interval endotracheal tube in satisfactory position.  The heart remains normal in size.  Increased right lung airspace opacity and mildly improved left lung airspace opacity.  A prosthetic heart valve is again demonstrated.  Interval curvilinear density at the medial right lung base.  A small left pleural effusion is unchanged.  IMPRESSION:  1.  Satisfactory position of the endotracheal tube. 2.  Increased right lung pneumonia or asymmetrical alveolar edema. 3.  Improving left lung atelectasis and possible pneumonia. 4.  Small left pleural effusion. 5.  Interval probable atelectasis at the medial right lung base.  Original Report Authenticated By: Darrol Angel, M.D.   Dg Chest Port 1 View  11/25/2011  *RADIOLOGY REPORT*  Clinical Data: Decreased mental status.  Leukocytosis.  PORTABLE CHEST - 1 VIEW  Comparison: 11/21/2011.  Findings: Post mitral annular/valve replacement.  Heart size top normal.   Radiopaque structure right upper quadrant unchanged.  Etiology indeterminate.  Gas distended stomach and colon.  Interval development of asymmetric air space disease greater on the right, this may represent asymmetric infectious infiltrate given the patient's history of leukocytosis.  Asymmetric pulmonary edema is also a consideration.  Small left-sided pleural effusion suspected.  Elevated right hemidiaphragm limiting evaluation of the possibility of right-sided pleural effusion or right base mass.  IMPRESSION: Interval development of asymmetric air space disease greater on the right, this may represent an asymmetric infectious infiltrate given the patient's history of leukocytosis.  Asymmetric pulmonary edema is also a consideration.  Original Report Authenticated By: Fuller Canada, M.D.    Assessment and Plan:  Acute Respiratory failure - in setting septic shock, abd distension and likely aspiration PNA, ARDS PLAN -  Left lung blossom, ARDS, asp likely significant Maintain ARDS peep?fio2 goals T v300 tolerated,would increase rate further pcxr in am  Would have neg balance, FACTT as off pressors now, cvp 13   CDiff colitis -- worsening,  with shock. PLAN -  Continue vanc enema No role IVIG CT reviewed, no role colectomy, improved clinically Maintain flagyl Low threshold change to fidaxomycin,if no progress  Aspiration PNA--  PLAN -  ceftaz monotherapy, limit course in setting cdiff Sputum send F/u CXR in am   Hypotension/ septic shock  PLAN -  Lactic 1 , re assuring Appreciate CVTS recs BP MAP goal 55 with good MS goal Off pressors lasix  Encephalopathy -- likely TME r/t sepsis PLAN -  Check CT neg acute, reviewed Int sedation WUA daily PT, aggressive  Coagulopathy - in setting coumadin for Afib / sepsis PLAN -  inr corrected Hold further anticoagulation , see INR 9 yesterday, re eval in am   Afib rvr - Cards following.   Brady x 2 amio back on , fib this am ,  tolerating thus far  Chronic diastolic CHF/ mitral regurg - Cards following.  EF55-60% on echo 10/2011 PLAN -  lasix  Hyponatremia - hypoK, Mgm, phos PLAN -  Replace, recheck kvo Lasix neg goal 2 lit Gross overload  Anemia --  PLAN -  F/u cbc in am  Dilution for sure Plat 71 k, sepsis scd Consider sub q hep coags in am   Malnurished, severe Attempt trickle feeds, avoid TPN ppi oxepa - ARDS  Best practices / Disposition: -->ICU status under PCCM -->full code -->SCD's and sub q 3/19 for DVT Px -->Protonix for GI Px -->ventilator bundle / ARDS   Nelda Bucks., MD 11/26/2011  9:39 AM Pager: [phone]  *Care during the described time interval was provided by me and/or other providers on the critical care team. I have reviewed this patient's available data, including medical history, events of note, physical examination and test results as part of my evaluation.   Ccm 30 min   Mcarthur Rossetti. Tyson Alias, MD, FACP Pgr: [phone] [facility]

## 2011-11-26 NOTE — Progress Notes (Signed)
eLink Physician-Brief Progress Note Patient Name: ZOA DOWTY DOB: [birth year] MRN: [id number]  Date of Service  11/26/2011   HPI/Events of Note  Hypophosphatemia and hypokalemia   eICU Interventions  Potassium and phos replaced   Intervention Category Intermediate Interventions: Electrolyte abnormality - evaluation and management  Cobey Raineri,[place] 11/26/2011, 3:21 AM

## 2011-11-27 ENCOUNTER — Inpatient Hospital Stay (HOSPITAL_COMMUNITY): Payer: Medicare Other

## 2011-11-27 LAB — CBC
MCH: 27.1 pg (ref 26.0–34.0)
MCV: 80.5 fL (ref 78.0–100.0)
Platelets: 84 10*3/uL — ABNORMAL LOW (ref 150–400)
RDW: 17.6 % — ABNORMAL HIGH (ref 11.5–15.5)
WBC: 27.9 10*3/uL — ABNORMAL HIGH (ref 4.0–10.5)

## 2011-11-27 LAB — DIFFERENTIAL
Basophils Absolute: 0.3 10*3/uL — ABNORMAL HIGH (ref 0.0–0.1)
Lymphs Abs: 1.1 10*3/uL (ref 0.7–4.0)
Monocytes Absolute: 0.8 10*3/uL (ref 0.1–1.0)

## 2011-11-27 LAB — CULTURE, BLOOD (ROUTINE X 2)
Culture  Setup Time: 201303141914
Culture: NO GROWTH

## 2011-11-27 LAB — BASIC METABOLIC PANEL
BUN: <3 mg/dL — ABNORMAL LOW (ref 6–23)
CO2: 27 mEq/L (ref 19–32)
Chloride: 98 mEq/L (ref 96–112)
Creatinine, Ser: 0.4 mg/dL — ABNORMAL LOW (ref 0.50–1.10)
GFR calc Af Amer: >90 mL/min (ref >90)
Glucose, Bld: 110 mg/dL — ABNORMAL HIGH (ref 70–99)

## 2011-11-27 LAB — COMPREHENSIVE METABOLIC PANEL
ALT: 10 U/L (ref 0–35)
Calcium: 7.3 mg/dL — ABNORMAL LOW (ref 8.4–10.5)
Creatinine, Ser: 0.39 mg/dL — ABNORMAL LOW (ref 0.50–1.10)
GFR calc Af Amer: >90 mL/min (ref >90)
Glucose, Bld: 99 mg/dL (ref 70–99)
Sodium: 135 mEq/L (ref 135–145)
Total Protein: 4.1 g/dL — ABNORMAL LOW (ref 6.0–8.3)

## 2011-11-27 LAB — GLUCOSE, CAPILLARY
Glucose-Capillary: 118 mg/dL — ABNORMAL HIGH (ref 70–99)
Glucose-Capillary: 96 mg/dL (ref 70–99)

## 2011-11-27 LAB — POCT I-STAT 3, ART BLOOD GAS (G3+)
Bicarbonate: 28 mEq/L — ABNORMAL HIGH (ref 20.0–24.0)
Patient temperature: 97
TCO2: 29 mmol/L (ref 0–100)
pH, Arterial: 7.513 — ABNORMAL HIGH (ref 7.350–7.400)
pO2, Arterial: 113 mmHg — ABNORMAL HIGH (ref 80.0–100.0)

## 2011-11-27 LAB — PROTIME-INR: INR: 1.43 (ref 0.00–1.49)

## 2011-11-27 MED ORDER — OXEPA PO LIQD
1000.0000 mL | ORAL | Status: DC
  Administered 2011-11-27 – 2011-11-28 (×3): 1000 mL
  Filled 2011-11-27 (×5): qty 1000

## 2011-11-27 MED ORDER — POTASSIUM CHLORIDE 10 MEQ/50ML IV SOLN
10.0000 meq | INTRAVENOUS | Status: AC
  Administered 2011-11-27 (×4): 10 meq via INTRAVENOUS
  Filled 2011-11-27: qty 200

## 2011-11-27 MED ORDER — AMIODARONE IV BOLUS ONLY 150 MG/100ML
150.0000 mg | Freq: Once | INTRAVENOUS | Status: AC
  Administered 2011-11-27: 150 mg via INTRAVENOUS

## 2011-11-27 NOTE — Progress Notes (Signed)
eLink Physician-Brief Progress Note Patient Name: Michaela [name] DOB: 06-11-1956 MRN: [id number]  Date of Service  11/27/2011   HPI/Events of Note  hypokalemia   eICU Interventions  Potassium replaced   Intervention Category Minor Interventions: Electrolytes abnormality - evaluation and management  [name],[name] 11/27/2011, 5:21 AM

## 2011-11-27 NOTE — Progress Notes (Signed)
Patient  Converted to a-fib with RVR with rate of 130-150's. E-link MD notified, orders obtained for Amiodarone bolus. Bolus administered. Dr. Excell Seltzer at the bedside.  Michaela [name]

## 2011-11-27 NOTE — Progress Notes (Signed)
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  She was admitted 3/14 by [facility] with CDiff colitis and hypotension, PCCM was consulted. Hypotension initially improved with fluid resuscitation and PCCM signed off.  On 3/14 developed worsening hypotension, obtundation and resp failure and PCCM called back to assume ICU care.   Lines / Drains: ETT 3/18>>> Left IJ 3/18>>> Rt fem line 3/18>>>  Cultures: Urine 3/14>>> neg BCx2 3/14>>>neg CDiff 3/14>>> POS Stool 3/14>>>neg BCx2 3/18>>> Urine 3/18>>> Sputum 3/19>>>  Antibiotics: Flagyl (CDiff) 3/14>>> Vanc (oral - CDiff) 3/14>>> Vanc (enema-Cdiff) 3/18>>> Ceftaz (?asp PNA) 3/18>>>  Tests / Events: 3/14 resp failure, hypotension, intubated 3.18 CT abdo/pelvis -Prominent wall thickening throughout most of the colon, which certainly could be a secondary finding from C. difficile colitis. Bilateral small pleural effusions with bibasilar consolidation. Extensive subcutaneous and mesenteric edema, with ascites.  4. Portions of the VP shunt may be discontinuous. 3/18 Ct head -Stable appearance of the posterior fossa and of the lateral  ventricles, with continued white matter hypodensities in the  parietal lobes.  2. Right lateral ventricular shunt noted.  3. No acute intracranial findings. 3/18 - ARDS 3/19 -off pressors 3/20 afib rvr, neg 4 lit!!  Vital Signs: Temp:  [97.3 F (36.3 C)-98.8 F (37.1 C)] 98.3 F (36.8 C) (03/20 0755) Pulse Rate:  [56-117] 99  (03/20 1000) Resp:  [15-31] 28  (03/20 1000) BP: (128-137)/(65-74) 128/69 mmHg (03/19 1718) SpO2:  [95 %-100 %] 99 % (03/20 1000) Arterial Line BP: (93-148)/(42-75) 106/42 mmHg (03/20 1000) FiO2 (%):  [39.2 %-60.3 %] 40 % (03/20 1000) Weight:  [55.2 kg (121 lb 11.1  oz)] 55.2 kg (121 lb 11.1 oz) (03/20 0400) I/O last 3 completed shifts: In: 5147 [I.V.:1371; Other:180; NG/GT:160; IV Piggyback:3436] Out: 6865 [Urine:6865]  Physical Examination:  General: chronically ill appearing, NAD HEENT: mm dry, line dry Neuro:follows commands int, rass -1 Lungs: ronchi rt greater left unchanged,maybe less coarse Cards: s1s2 ireg, RVR now 136 Abd: firm, less istended, -bs Ext: warm and dry, groin and abdoedema   Ventilator settings: Vent Mode:  [-] PRVC FiO2 (%):  [39.2 %-60.3 %] 40 % Set Rate:  [26 bmp] 26 bmp Vt Set:  [300 mL] 300 mL PEEP:  [8 cmH20-10 cmH20] 8 cmH20 Plateau Pressure:  [10 cmH20-21 cmH20] 21 cmH20  Labs and Imaging:   CBC    Component Value Date/Time   WBC 27.9* 11/27/2011 0430   RBC 3.03* 11/27/2011 0430   HGB 8.2* 11/27/2011 0430   HCT 24.4* 11/27/2011 0430   PLT 84* 11/27/2011 0430   MCV 80.5 11/27/2011 0430   MCH 27.1 11/27/2011 0430   MCHC 33.6 11/27/2011 0430   RDW 17.6* 11/27/2011 0430   LYMPHSABS 1.1 11/27/2011 0430   MONOABS 0.8 11/27/2011 0430   EOSABS 0.3 11/27/2011 0430   BASOSABS 0.3* 11/27/2011 0430    BMET    Component Value Date/Time   NA 135 11/27/2011 0430   K 3.1* 11/27/2011 0430   CL 101 11/27/2011 0430   CO2 26 11/27/2011 0430   GLUCOSE 99 11/27/2011 0430   BUN <3* 11/27/2011 0430   CREATININE 0.39* 11/27/2011 0430   CREATININE 0.55 11/11/2011 1414   CALCIUM 7.3* 11/27/2011 0430   GFRNONAA >90 11/27/2011 0430   GFRAA >90 11/27/2011 0430  Lab 11/27/11 0430  INR 1.43    ABG    Component Value Date/Time   PHART 7.415* 11/26/2011 1845   PCO2ART 35.8 11/26/2011 1845   PO2ART 222.0* 11/26/2011 1845   HCO3 22.6 11/26/2011 1845   TCO2 23.7 11/26/2011 1845   ACIDBASEDEF 1.3 11/26/2011 1845   O2SAT 98.3 11/26/2011 1845    Ct Abdomen Pelvis Wo Contrast  11/25/2011  *RADIOLOGY REPORT*  Clinical Data: Elevated INR.  No fever.  Query C difficile sepsis. Query ischemic bowel.  CT ABDOMEN AND PELVIS WITHOUT CONTRAST   Technique:  Multidetector CT imaging of the abdomen and pelvis was performed following the standard protocol without intravenous contrast.  Comparison: 09/24/2011  Findings: Consolidation noted in both lower lobes, right greater than left, with air bronchograms and small bilateral pleural effusions.  Diffuse somewhat striking subcutaneous edema is present suggesting extensive third spacing of fluid.  No pericardial effusion noted.  Marked wall thickening of the colon extends from the mid ascending colon through the transverse, descending, and sigmoid colon to the rectum.  This certainly could represent C difficile colitis; IV contrast was not administered and accordingly assessment for enhancing pseudomembranes is not feasible.  The colon measures up to 6 cm in diameter.  No definite dilated small bowel is observed.  There appear to be portions of the ventricular peritoneal shunt which may be discontinuous, for example on image 13 of series 2.  Dependent density in the gallbladder could represent vicarious excretion of contrast medium or layering gallstones.  A nasogastric or feeding tube extends into the transverse duodenum. Scattered ascites and mesenteric edema noted.  The extensive subcutaneous edema is particularly prominent in the pelvis, with swelling of the upper thigh regions and in the labia. A Foley catheter is present in the urinary bladder.  A left femoral line is noted.  IMPRESSION:  1.  Prominent wall thickening throughout most of the colon, which certainly could be a secondary finding from C. difficile colitis. 2.  Bilateral small pleural effusions with bibasilar consolidation. 3.  Extensive subcutaneous and mesenteric edema, with ascites. 4.  Portions of the VP shunt may be discontinuous.  Original Report Authenticated By: Dellia Cloud, M.D.   Ct Head Wo Contrast  11/25/2011  *RADIOLOGY REPORT*  Clinical Data: Elevated INR, query intracranial bleeding. Remote astrocytoma at age 89.  CT  HEAD WITHOUT CONTRAST  Technique:  Contiguous axial images were obtained from the base of the skull through the vertex without contrast.  Comparison: 10/08/2005  Findings: Postoperative findings in the postop posterior fossa noted with cerebellar encephalomalacia.  Stable cystic appearance along the fourth ventricle noted, with posterior can cavity of portions of the pons and midbrain.  These findings appears similar to the prior MRI from 2007.  Right sided shunt noted with tip in the right lateral ventricle. Parietal white matter hypodensities are again noted.  No acute intracranial findings are observed.  No intracranial hemorrhage, mass lesion, or acute CVA noted.  IMPRESSION:  1.  Stable appearance of the posterior fossa and of the lateral ventricles, with continued white matter hypodensities in the parietal lobes. 2.  Right lateral ventricular shunt noted. 3.  No acute intracranial findings.  Original Report Authenticated By: Dellia Cloud, M.D.   Dg Chest Port 1 View  11/27/2011  *RADIOLOGY REPORT*  Clinical Data: Endotracheal tube  PORTABLE CHEST - 1 VIEW  Comparison: Chest radiograph 11/26/2011  Findings: Endotracheal tube is positioned 3 cm from carina unchanged.  NG tube and left central venous  line are unchanged. Patient is rotated leftward.  Stable cardiac silhouette.  The are bilateral pleural effusions, left greater than right which are stable.  There is bilateral air space disease which is slightly less dense than prior.  No pneumothorax.  IMPRESSION:  1.  Some improvement in dense bilateral air space disease. 2.  Stable pleural effusions. 3.  Stable support apparatus.  Original Report Authenticated By: Genevive Bi, M.D.   Dg Chest Port 1 View  11/26/2011  *RADIOLOGY REPORT*  Clinical Data: Respiratory failure, evaluate endotracheal tube  PORTABLE CHEST - 1 VIEW  Comparison: 11/25/2011; 11/21/2011  Findings:  Grossly unchanged march cardiac silhouette and mediastinal contours given  patient rotation.  Post valve replacement.  Stable positioning of support apparatus. Minimal worsening of diffuse right-sided and left mid lung heterogeneous air space opacities. Grossly unchanged small bilateral effusions, right greater than left. Enteric contrast seen within the hepatic flexure of the colon.  Grossly unchanged bones.  IMPRESSION: 1.  Stable positioning of support apparatus.  No pneumothorax. 2.  Minimal worsening of bilateral air space opacities, right greater than left, worrisome for multifocal infection. 3.  Unchanged small bilateral effusions.  Original Report Authenticated By: Waynard Reeds, M.D.   Dg Chest Port 1 View  11/25/2011  *RADIOLOGY REPORT*  Clinical Data: Left internal jugular central line  PORTABLE CHEST - 1 VIEW  Comparison: Same day  Findings: Endotracheal tube has its tip 3 cm above the carina. Nasogastric tube enters the abdomen and probably extends into the descending duodenum.  Left internal jugular central line has its tip either at the SVC/RA junction or within the right atrium. There has been previous valve replacement.  Bilateral pulmonary density has worsened, more on the right than the left.  Findings are most consistent with bronchopneumonia.  Bilateral effusions persist.  An element of pulmonary edema is not excluded.  IMPRESSION: Left internal jugular central line has its tip at the SVC/RA junction or within the right atrium.  No pneumothorax.  Persistent bilateral airspace density right worse than left with bilateral effusions.  Pneumonia is suspected, though there could be coexistent edema.  Original Report Authenticated By: Thomasenia Sales, M.D.   Dg Chest Port 1 View  11/25/2011  *RADIOLOGY REPORT*  Clinical Data: Intubated.  PORTABLE CHEST - 1 VIEW  Comparison: Earlier today.  Findings: Interval endotracheal tube in satisfactory position.  The heart remains normal in size.  Increased right lung airspace opacity and mildly improved left lung airspace  opacity.  A prosthetic heart valve is again demonstrated.  Interval curvilinear density at the medial right lung base.  A small left pleural effusion is unchanged.  IMPRESSION:  1.  Satisfactory position of the endotracheal tube. 2.  Increased right lung pneumonia or asymmetrical alveolar edema. 3.  Improving left lung atelectasis and possible pneumonia. 4.  Small left pleural effusion. 5.  Interval probable atelectasis at the medial right lung base.  Original Report Authenticated By: Darrol Angel, M.D.    Assessment and Plan:  Acute Respiratory failure - in setting septic shock, abd distension and likely aspiration PNA, ARDS PLAN -  Maintain low TV ventilation, abg reviewed,reduce rate Likely can reduce peep to 5, if able then CPAP PS attempts Maintain neg balance goal 2 lit,may need lasix reduction with RVR fib and prior 4 lit neg abalanced pcxr encouraging   CDiff colitis -- worsening, with shock. PLAN -  Continue vanc enema No role IVIG CT reviewed, no role colectomy, improved clinically Maintain flagyl  Low threshold change to fidaxomycin,if no progress  Aspiration PNA--  PLAN -  ceftaz monotherapy, limit course in setting cdiff Sputum remains cultur eneg F/u CXR in am ,improved with neg baalance Continue cefta for now  Hypotension/ septic shock  PLAN -  cvp noted Continue lasix Now rvr, may need reduction  Encephalopathy -- likely TME r/t sepsis PLAN -  WUA Dc versed Chair position when rvr better  Coagulopathy - in setting coumadin for Afib / sepsis PLAN -  inr corrected With in / out fib, would favor heparin, wil/ w cards  Afib rvr - Cards following.   amio bolus May need cardizem added  Chronic diastolic CHF/ mitral regurg - Cards following.  EF55-60% on echo 10/2011 PLAN -  Lasix maintain  Hyponatremia - hypoK, Mgm, phos PLAN -  Replace k  Chem in am  Mag,phos eval kvo  Anemia --  PLAN -  F/u cbc in am  Dilution for sure Plat rising with  lasix Cbc in am   Malnurished, severe Attempt trickle feeds, avoid TPN ppi oxepa - ARDS Advance TF if tolerated to 20   Best practices / Disposition: -->ICU status under PCCM -->full code -->SCD's and sub q 3/19 for DVT Px -->Protonix for GI Px -->ventilator bundle / ARDS   Nelda Bucks., MD 11/27/2011  10:46 AM Pager: [phone]  *Care during the described time interval was provided by me and/or other providers on the critical care team. I have reviewed this patient's available data, including medical history, events of note, physical examination and test results as part of my evaluation.   Ccm 30 min   Mcarthur Rossetti. Tyson Alias, MD, FACP Pgr: [phone] [facility]

## 2011-11-27 NOTE — Progress Notes (Signed)
Physical Therapy Evaluation  Past Medical History  Diagnosis Date  . 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 MVP (mitral valve prolapse)   . Mitral regurgitation   . Shortness of breath   . Heart murmur   . Neuromuscular disorder     numbness in hands and feet  . Arthritis   . Overactive bladder   . Pleural effusion, left 11/11/2011   Past Surgical History  Procedure Date  . Ventriculoperitoneal shunt 1971  . Knee arthroscopy     right  . Cardiac catheterization     Jan 2013  . Transesophageal echocardiogram 7/12  . US echocardiography 6/12  . Mitral valve repair 10/15/2011    Procedure: MINIMALLY INVASIVE MITRAL VALVE REPAIR (MVR);  Surgeon: Purcell Nails, MD;  Location: [facility] OR;  Service: Open Heart Surgery;  Laterality: Right;    11/27/11 1500  PT Visit Information  Last PT Received On 11/27/11  Patient Stated Goals  Goal #1 Per Mother to return to living Independently.    Precautions  Precautions Fall (pt on Vent and with Feeding Tube)  Restrictions  Weight Bearing Restrictions No  Other Position/Activity Restrictions pt doesn't have WBing Restrictions.  pt has been W/C bound for 67yrs per Mother.    Home Living  Lives With Alone  Receives Help From Family;Personal care attendant  Type of Home Apartment  Home Layout One level  Home Access Level entry  Bathroom Accessibility Yes  How Accessible Accessible via wheelchair  Home Adaptive Equipment Wheelchair - manual;Bedside commode/3-in-1;Wheelchair - powered  Additional Comments Pt had been living alone until last hospitalization when she D/C'd to [facility] for Rehab.    Prior Function  Level of Independence Needs assistance with ADLs;Needs assistance with homemaking;Requires assistive device for independence;Independent with transfers  Able to Take Stairs? No  Driving No  Vocation On disability  Cognition  Cognition - Other Comments pt intubated, but follows all directions and nods head  yes/no appropriate.    Bed Mobility  Bed Mobility Yes  Supine to Sit Other (comment) (Chair position in ICU bed)  Supine to Sit Details (indicate cue type and reason) pt placed in 60 degrees of Chair position in ICU bed.  pt fatigues quickly and needs encouragement to stay in chair position for ROM.  pt tolerated sitting for ~88mins before needing to be returned to bed position with HOB at 40degrees.    Transfers  Transfers No  Ambulation/Gait  Ambulation/Gait No  Stairs No  Wheelchair Mobility  Wheelchair Mobility No  Posture/Postural Control  Posture/Postural Control Postural limitations  Postural Limitations pt kyphotic, but difficult to assess as pt remained in bed in chair position.    Balance  Balance Assessed No (pt unable to tolerate sitting forward in chair position.  )  RUE Assessment  RUE Assessment Not tested  LUE Assessment  LUE Assessment Not tested  RLE Assessment  RLE Assessment X  RLE AROM (degrees)  RLE Overall AROM Comments pt's PROM near [facility], however grossly weak ~2/5  LLE Assessment  LLE Assessment X  LLE AROM (degrees)  LLE Overall AROM Comments pt PROM near [facility], but grossly weak ~2/5  Exercises  Exercises Low Level/ICU  PT - End of Session  Equipment Utilized During Treatment (ICU bed)  Activity Tolerance Patient limited by fatigue  Patient left in bed;with call bell in reach;with family/visitor present  Nurse Communication (Tolerance of chair position)  General  Behavior During Session [facility] for tasks performed  Cognition  WFL for tasks performed  PT Assessment  Clinical Impression Statement pt presents with Resp Failure.  pt currently alert and following one step directions while intubated.  pt able to tolerate ~40mins in chair position at 60degrees with AAROM Bil LEs and UEs.  pt would benefit from continued PT to improve overall mobility and decrease burden of care.    PT Recommendation/Assessment Patient will need skilled PT in the acute care venue    PT Problem List Decreased strength;Decreased range of motion;Decreased activity tolerance;Decreased balance;Decreased mobility;Decreased knowledge of use of DME;Cardiopulmonary status limiting activity  Barriers to Discharge None  PT Therapy Diagnosis  Generalized weakness (Decondition/Debility)  PT Plan  PT Frequency Min 2X/week  PT Treatment/Interventions DME instruction;Functional mobility training;Therapeutic activities;Therapeutic exercise;Balance training;Neuromuscular re-education;Patient/family education  PT Recommendation  Recommendations for Other Services OT consult  Follow Up Recommendations Skilled nursing facility  Equipment Recommended Defer to next venue  Individuals Consulted  Consulted and Agree with Results and Recommendations Patient;Family member/caregiver  Family Member Consulted Mother  Acute Rehab PT Goals  PT Goal Formulation With patient/family  Time For Goal Achievement 2 weeks  Pt will go Supine/Side to Sit with +2 total assist (pt 50%)  PT Goal: Supine/Side to Sit - Progress Goal set today  Pt will go Sit to Supine/Side with +2 total assist (pt 50%)  PT Goal: Sit to Supine/Side - Progress Goal set today  Pt will Transfer Bed to Chair/Chair to Bed with +2 total assist (pt 30%)  PT Transfer Goal: Bed to Chair/Chair to Bed - Progress Goal set today  Additional Goals  Additional Goal #1 pt will tolerate sitting supported .    PT Goal: Additional Goal #1 - Progress Goal set today    Mack Hook, PT [phone]

## 2011-11-27 NOTE — Progress Notes (Signed)
Nutrition Follow-up  Oxepa formula increased to 20 ml/hr this morning, per MD. RN reports pt tolerating TF well this morning.  Diet Order:  NPO TF: Oxepa at 20 ml/hr providing 720 kcal, 30 grams protein, 377 ml free water. (Goal is Oxepa at 25 ml/hr with 30 ml Prostat BID)  Meds: Scheduled Meds:   . amiodarone  150 mg Intravenous Once  . antiseptic oral rinse  15 mL Mouth Rinse QID  . calcium gluconate  1 g Intravenous Once  . cefTAZidime (FORTAZ)  IV  1 g Intravenous Q8H  . chlorhexidine  15 mL Mouth Rinse BID  . famotidine  20 mg Oral Daily  . feeding supplement (OXEPA)  1,000 mL Per Tube Q24H  . furosemide  20 mg Intravenous Q6H  . heparin subcutaneous  5,000 Units Subcutaneous Q8H  . insulin aspart  0-9 Units Subcutaneous Q4H  . magnesium sulfate 1 - 4 g bolus IVPB  2 g Intravenous Once  . metronidazole  500 mg Intravenous Q6H  . potassium chloride  10 mEq Intravenous Q1H  . potassium chloride  10 mEq Intravenous Q1 Hr x 4  . SAILS Study - rosuvastatin / placebo (loading dose)  (PI-Wright)  40 mg Oral Once  . SAILS Study - rosuvastatin / placebo (PI-Wright)  20 mg Oral Daily  . sodium chloride  3 mL Intravenous Q12H  . vancomycin  500 mg Oral Q6H  . vancomycin (VANCOCIN) rectal enema  500 mg Rectal Q6H  . DISCONTD: famotidine (PEPCID) IV  20 mg Intravenous Q24H  . DISCONTD: sodium chloride  500 mL Intravenous Once   Continuous Infusions:   . sodium chloride 20 mL/hr at 11/26/11 2356  . amiodarone (NEXTERONE PREMIX) 360 mg/200 mL dextrose 150 mg/hr (11/27/11 1056)  . DISCONTD: feeding supplement (OXEPA) 1,000 mL (11/26/11 1629)   PRN Meds:.acetaminophen, alum & mag hydroxide-simeth, fentaNYL, furosemide, levalbuterol, ondansetron (ZOFRAN) IV, DISCONTD: DOBUTamine, DISCONTD: midazolam, DISCONTD: norepinephrine (LEVOPHED) Adult infusion, DISCONTD: phenylephrine (NEO-SYNEPHRINE) Adult infusion, DISCONTD: sodium chloride, DISCONTD: traMADol  Labs:  CMP     Component Value  Date/Time   NA 135 11/27/2011 0430   K 3.1* 11/27/2011 0430   CL 101 11/27/2011 0430   CO2 26 11/27/2011 0430   GLUCOSE 99 11/27/2011 0430   BUN <3* 11/27/2011 0430   CREATININE 0.39* 11/27/2011 0430   CREATININE 0.55 11/11/2011 1414   CALCIUM 7.3* 11/27/2011 0430   PROT 4.1* 11/27/2011 0430   ALBUMIN 1.5* 11/27/2011 0430   AST 25 11/27/2011 0430   ALT 10 11/27/2011 0430   ALKPHOS 85 11/27/2011 0430   BILITOT 0.3 11/27/2011 0430   GFRNONAA >90 11/27/2011 0430   GFRAA >90 11/27/2011 0430  Phosphorus 2.7 WNL Magnesium 1.6 WNL   Intake/Output Summary (Last 24 hours) at 11/27/11 1203 Last data filed at 11/27/11 1100  Gross per 24 hour  Intake 1928.1 ml  Output   6710 ml  Net -4781.9 ml   Ht: 4\' 11"  (149.9 cm)   Weight Status: 55.2 kg - wt beginning to decline, likely 2/2 negative fluid balance Admit/Baseline wt: 36.3 kg  Re-estimated needs:   Kcal: 1127 kcal  Protein: 68 - 78 grams  Fluid: 1 - 1.2 L/d  Nutrition Dx:  Nutrition Dx: Inadequate oral intake r/t inability to eat AEB NPO status. Ongoing.  Goal:  Nutrition support to provide >90% of protein and kcal needs. Unmet.  Monitor: Extubation, initiation of nutrition support, weights, labs, I/O's  Intervention:  Once able to advance per MD advance by 10  ml/hr every 12 hours (slow advancement 2/2 risk for refeeding syndrome) to goal of 25 ml/hr. 30 ml Prostat via tube BID. Total TF regimen will provide: 1044 kcal, 68 grams protein, 471 ml free water. RD to continue to follow.  Adair Laundry Pager #:  [phone]

## 2011-11-27 NOTE — Progress Notes (Signed)
    Subjective:  Intubated and sedated. Patient is easily arousable and follows commands. She just went back into atrial fibrillation with RVR a few minutes ago.  Objective:  Vital Signs in the last 24 hours: Temp:  [97.3 F (36.3 C)-98.8 F (37.1 C)] 98.3 F (36.8 C) (03/20 0755) Pulse Rate:  [76-117] 100  (03/20 0800) Resp:  [15-31] 26  (03/20 0800) BP: (128-137)/(65-74) 128/69 mmHg (03/19 1718) SpO2:  [95 %-100 %] 100 % (03/20 0800) Arterial Line BP: (93-148)/(46-75) 123/56 mmHg (03/20 0800) FiO2 (%):  [39.2 %-60.3 %] 40 % (03/20 0800) Weight:  [55.2 kg (121 lb 11.1 oz)] 55.2 kg (121 lb 11.1 oz) (03/20 0400)  Intake/Output from previous day: 03/19 0701 - 03/20 0700 In: 2079.8 [I.V.:815.8; NG/GT:160; IV Piggyback:924] Out: 6330 [Urine:6330]  Physical Exam: Pt is awake and is arousable to verbal stimuli, NAD HEENT: normal Neck: JVP - normal Lungs: Coarse breath sounds with diffuse rales and rhonchi bilaterally CV: Tachycardic, irregularly irregular Abd: soft, NT, Positive BS, no hepatomegaly Ext: Diffuse edema Skin: warm/dry no rash   Lab Results:  Basename 11/27/11 0430 11/26/11 0440  WBC 27.9* 32.5*  HGB 8.2* 8.4*  PLT 84* 77*    Basename 11/27/11 0430 11/26/11 1315  NA 135 135  K 3.1* 2.7*  CL 101 109  CO2 26 18*  GLUCOSE 99 90  BUN <3* 3*  CREATININE 0.39* 0.29*   No results found for this basename: TROPONINI:2,CK,MB:2 in the last 72 hours  Tele:  Has been sinus rhythm until a few minutes ago when she developed atrial fib with RVR, heart rate currently 130s  CXR: IMPRESSION:  1. Some improvement in dense bilateral air space disease.  2. Stable pleural effusions.  3. Stable support apparatus.  Assessment/Plan:  1. Paroxysmal atrial fibrillation  2. Acute respiratory failure  3. C Dif Colitis  4. S/P mitral valve repair  The patient remains on intravenous amiodarone. She is currently receiving an amiodarone bolus of 150 mg to try to convert her  back to sinus rhythm. I suspect her atrial fib will remain difficult to control in the setting of her critical illness. Continue other supportive care as per the CCM team.   Tonny Bollman, M.D. 11/27/2011, 9:03 AM

## 2011-11-28 ENCOUNTER — Inpatient Hospital Stay (HOSPITAL_COMMUNITY): Payer: Medicare Other

## 2011-11-28 DIAGNOSIS — J189 Pneumonia, unspecified organism: Secondary | ICD-10-CM

## 2011-11-28 DIAGNOSIS — I369 Nonrheumatic tricuspid valve disorder, unspecified: Secondary | ICD-10-CM

## 2011-11-28 DIAGNOSIS — R6520 Severe sepsis without septic shock: Secondary | ICD-10-CM

## 2011-11-28 LAB — DIFFERENTIAL
Basophils Relative: 1 % (ref 0–1)
Eosinophils Absolute: 0.3 10*3/uL (ref 0.0–0.7)
Eosinophils Relative: 1 % (ref 0–5)
Monocytes Absolute: 0.8 10*3/uL (ref 0.1–1.0)
Neutro Abs: 24.8 10*3/uL — ABNORMAL HIGH (ref 1.7–7.7)

## 2011-11-28 LAB — CBC
HCT: 24.2 % — ABNORMAL LOW (ref 36.0–46.0)
Hemoglobin: 7.8 g/dL — ABNORMAL LOW (ref 12.0–15.0)
MCH: 27.1 pg (ref 26.0–34.0)
MCHC: 32.2 g/dL (ref 30.0–36.0)
MCV: 84 fL (ref 78.0–100.0)
RBC: 2.88 MIL/uL — ABNORMAL LOW (ref 3.87–5.11)

## 2011-11-28 LAB — POCT I-STAT 3, ART BLOOD GAS (G3+)
O2 Saturation: 97 %
TCO2: 31 mmol/L (ref 0–100)

## 2011-11-28 LAB — CULTURE, RESPIRATORY W GRAM STAIN: Culture: NO GROWTH

## 2011-11-28 LAB — BASIC METABOLIC PANEL
BUN: 3 mg/dL — ABNORMAL LOW (ref 6–23)
CO2: 30 mEq/L (ref 19–32)
Chloride: 98 mEq/L (ref 96–112)
Glucose, Bld: 112 mg/dL — ABNORMAL HIGH (ref 70–99)
Potassium: 3.2 mEq/L — ABNORMAL LOW (ref 3.5–5.1)

## 2011-11-28 LAB — GLUCOSE, CAPILLARY
Glucose-Capillary: 117 mg/dL — ABNORMAL HIGH (ref 70–99)
Glucose-Capillary: 122 mg/dL — ABNORMAL HIGH (ref 70–99)

## 2011-11-28 MED ORDER — POTASSIUM CHLORIDE 20 MEQ/15ML (10%) PO LIQD
40.0000 meq | Freq: Once | ORAL | Status: AC
  Administered 2011-11-28: 40 meq
  Filled 2011-11-28: qty 30

## 2011-11-28 MED ORDER — POTASSIUM PHOSPHATE DIBASIC 3 MMOLE/ML IV SOLN
20.0000 mmol | Freq: Once | INTRAVENOUS | Status: AC
  Administered 2011-11-28: 20 mmol via INTRAVENOUS
  Filled 2011-11-28: qty 6.67

## 2011-11-28 MED ORDER — DEXTROSE 5 % IV SOLN
1.0000 g | Freq: Every day | INTRAVENOUS | Status: DC
  Administered 2011-11-28 – 2011-11-29 (×2): 1 g via INTRAVENOUS
  Filled 2011-11-28 (×3): qty 10

## 2011-11-28 MED ORDER — MIDAZOLAM HCL 2 MG/2ML IJ SOLN
2.0000 mg | INTRAMUSCULAR | Status: DC | PRN
  Administered 2011-11-28: 2 mg via INTRAVENOUS
  Filled 2011-11-28: qty 2

## 2011-11-28 MED ORDER — HEPARIN BOLUS VIA INFUSION
1000.0000 [IU] | Freq: Once | INTRAVENOUS | Status: AC
  Administered 2011-11-28: 1000 [IU] via INTRAVENOUS
  Filled 2011-11-28: qty 1000

## 2011-11-28 MED ORDER — POTASSIUM CHLORIDE 20 MEQ/15ML (10%) PO LIQD
40.0000 meq | ORAL | Status: AC
  Administered 2011-11-28 – 2011-11-29 (×2): 40 meq
  Filled 2011-11-28 (×2): qty 30

## 2011-11-28 MED ORDER — MAGNESIUM SULFATE 50 % IJ SOLN: 4.0000 g | Freq: Once | INTRAMUSCULAR | Status: DC

## 2011-11-28 MED ORDER — POTASSIUM CHLORIDE 10 MEQ/50ML IV SOLN
10.0000 meq | INTRAVENOUS | Status: AC
  Administered 2011-11-28 (×4): 10 meq via INTRAVENOUS
  Filled 2011-11-28: qty 200

## 2011-11-28 MED ORDER — MAGNESIUM SULFATE 40 MG/ML IJ SOLN
4.0000 g | Freq: Once | INTRAMUSCULAR | Status: AC
  Administered 2011-11-28: 4 g via INTRAVENOUS
  Filled 2011-11-28: qty 100

## 2011-11-28 MED ORDER — HEPARIN (PORCINE) IN NACL 100-0.45 UNIT/ML-% IJ SOLN
750.0000 [IU]/h | INTRAMUSCULAR | Status: DC
  Administered 2011-11-28 – 2011-12-04 (×5): 750 [IU]/h via INTRAVENOUS
  Filled 2011-11-28 (×8): qty 250

## 2011-11-28 MED ORDER — DILTIAZEM HCL 25 MG/5ML IV SOLN
10.0000 mg | Freq: Once | INTRAVENOUS | Status: AC
  Administered 2011-11-28: 10 mg via INTRAVENOUS
  Filled 2011-11-28: qty 5

## 2011-11-28 MED ORDER — DILTIAZEM HCL 100 MG IV SOLR
10.0000 mg | INTRAVENOUS | Status: DC | PRN
  Administered 2011-11-28: 10 mg via INTRAVENOUS
  Filled 2011-11-28: qty 100

## 2011-11-28 NOTE — Progress Notes (Signed)
TCTS BRIEF PROGRESS NOTE   Progress noted and encouraging.  ECHO looks good with no MR, relatively low gradient across mitral valve, good LV function and no pericardial effusion.  It is not at all surprising that Michaela [name] remains very sensitive to volume with a tendency for pulmonary edema that is typically worse on the right side.  This is typical of patients with longstanding mitral valve disease.  Furthermore, her serum protein levels are extremely low which only further exacerbates the tendency for 3rd spacing fluids everywhere.  In addition, the right sided opacity on CXR will be further exacerbated by the fact that her recent surgery was performed via right thoracotomy, which is typically associated with some postoperative changes in right lung.  In the absence of other signs of infection, I would favor treating the right sided lung opacity as edema rather than pneumonia.   [name],[name] 11/28/2011 4:27 PM

## 2011-11-28 NOTE — Progress Notes (Signed)
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  She was admitted 3/14 by [facility] with CDiff colitis and hypotension, PCCM was consulted. Hypotension initially improved with fluid resuscitation and PCCM signed off.  On 3/14 developed worsening hypotension, obtundation and resp failure and PCCM called back to assume ICU care.   Lines / Drains: ETT 3/18>>> Left IJ 3/18>>> Rt fem line 3/18>>>  Cultures: Urine 3/14>>> neg BCx2 3/14>>>neg CDiff 3/14>>> POS Stool 3/14>>>neg BCx2 3/18>>> Urine 3/18>>> Sputum 3/19>>>  Antibiotics: Flagyl (CDiff) 3/14>>> Vanc (oral - CDiff) 3/14>>> Vanc (enema-Cdiff) 3/18>>> Ceftaz (?asp PNA) 3/18>>>3/21 Ceftriaxone 3/21>>>plan stop 3/25  Tests / Events: 3/14 resp failure, hypotension, intubated 3.18 CT abdo/pelvis -Prominent wall thickening throughout most of the colon, which certainly could be a secondary finding from C. difficile colitis. Bilateral small pleural effusions with bibasilar consolidation. Extensive subcutaneous and mesenteric edema, with ascites.  4. Portions of the VP shunt may be discontinuous. 3/18 Ct head -Stable appearance of the posterior fossa and of the lateral  ventricles, with continued white matter hypodensities in the  parietal lobes.  2. Right lateral ventricular shunt noted.  3. No acute intracranial findings. 3/18 - ARDS 3/19 -off pressors 3/20 afib rvr, neg 4 lit!! 3/21- neg 1 lit, improved vent  Vital Signs: Temp:  [97 F (36.1 C)-98.5 F (36.9 C)] 98.5 F (36.9 C) (03/21 0729) Pulse Rate:  [82-140] 94  (03/21 0811) Resp:  [17-35] 28  (03/21 0811) BP: (103-136)/(47-63) 136/63 mmHg (03/21 0811) SpO2:  [96 %-100 %] 98 % (03/21 0811) Arterial Line BP: (102-135)/(42-63) 127/59 mmHg (03/21 0800) FiO2 (%):   [39.2 %-40.3 %] 40 % (03/21 0812) Weight:  [53.6 kg (118 lb 2.7 oz)] 53.6 kg (118 lb 2.7 oz) (03/21 0500) I/O last 3 completed shifts: In: 3147.2 [I.V.:1401.2; Other:90; NG/GT:500; IV Piggyback:1156] Out: 6515 [Urine:6515]  Physical Examination:  General: chronically ill appearing, NAD HEENT: mm dry, line clean Neuro: follows commands int, rass -1 Lungs: ronch less coarse Cards: s1s2 ireg, RVR now 136 Abd: firm, less istended, -bs Ext: warm and dry, groin and abdoedema   Ventilator settings: Vent Mode:  [-] PSV FiO2 (%):  [39.2 %-40.3 %] 40 % Set Rate:  [20 bmp-26 bmp] 20 bmp Vt Set:  [300 mL] 300 mL PEEP:  [5 cmH20] 5 cmH20 Pressure Support:  [5 cmH20] 5 cmH20 Plateau Pressure:  [12 cmH20-13 cmH20] 12 cmH20  Labs and Imaging:   CBC    Component Value Date/Time   WBC 27.3* 11/28/2011 0400   RBC 2.88* 11/28/2011 0400   HGB 7.8* 11/28/2011 0400   HCT 24.2* 11/28/2011 0400   PLT 88* 11/28/2011 0400   MCV 84.0 11/28/2011 0400   MCH 27.1 11/28/2011 0400   MCHC 32.2 11/28/2011 0400   RDW 17.7* 11/28/2011 0400   LYMPHSABS 1.1 11/28/2011 0400   MONOABS 0.8 11/28/2011 0400   EOSABS 0.3 11/28/2011 0400   BASOSABS 0.3* 11/28/2011 0400    BMET    Component Value Date/Time   NA 134* 11/28/2011 0400   K 3.2* 11/28/2011 0400   CL 98 11/28/2011 0400   CO2 30 11/28/2011 0400   GLUCOSE 112* 11/28/2011 0400   BUN 3* 11/28/2011 0400   CREATININE 0.39* 11/28/2011 0400   CREATININE 0.55 11/11/2011 1414   CALCIUM 7.5* 11/28/2011  0400   GFRNONAA >90 11/28/2011 0400   GFRAA >90 11/28/2011 0400     Lab 11/28/11 0400  INR 1.52*    ABG    Component Value Date/Time   PHART 7.513* 11/27/2011 1226   PCO2ART 34.6* 11/27/2011 1226   PO2ART 113.0* 11/27/2011 1226   HCO3 28.0* 11/27/2011 1226   TCO2 29 11/27/2011 1226   ACIDBASEDEF 1.3 11/26/2011 1845   O2SAT 99.0 11/27/2011 1226    Dg Chest Port 1 View  11/28/2011  *RADIOLOGY REPORT*  Clinical Data: Check ET tube  PORTABLE CHEST - 1 VIEW  Comparison:  11/27/2011  Findings: Endotracheal tube terminates 2 cm above the carina.  Patchy bilateral interstitial/airspace opacities, right greater than left, compatible with pneumonia. Underlying focal opacity in the right upper lobe.  Small left pleural effusion.  No pneumothorax.  Stable left IJ venous catheter.  Enteric tube coursing below the diaphragm.  IMPRESSION: Endotracheal tube terminates 2 cm above the carina.  Patchy bilateral interstitial/airspace opacities, compatible with pneumonia.  Underlying focal opacity in the right upper lobe, possibly infectious.  Follow-up radiographs are suggested to document resolution.  Original Report Authenticated By: Charline Bills, M.D.   Dg Chest Port 1 View  11/27/2011  *RADIOLOGY REPORT*  Clinical Data: Endotracheal tube  PORTABLE CHEST - 1 VIEW  Comparison: Chest radiograph 11/26/2011  Findings: Endotracheal tube is positioned 3 cm from carina unchanged.  NG tube and left central venous line are unchanged. Patient is rotated leftward.  Stable cardiac silhouette.  The are bilateral pleural effusions, left greater than right which are stable.  There is bilateral air space disease which is slightly less dense than prior.  No pneumothorax.  IMPRESSION:  1.  Some improvement in dense bilateral air space disease. 2.  Stable pleural effusions. 3.  Stable support apparatus.  Original Report Authenticated By: Genevive Bi, M.D.    Assessment and Plan:  Acute Respiratory failure - in setting septic shock, abd distension and likely aspiration PNA, ARDS PLAN -  Big component edema,re assess mitral valve  contnue neg balance pcxr resolving well Wean cpap 5ps 5, goal 1 hr, rsbi, abg pcxr in am    CDiff colitis -- worsening, with shock. PLAN -  Continue vanc enema, will consider dc in 2 days Maintain flagyl Low threshold change to fidaxomycin,if no progress Seems to be resolving with combo, oral vanc  Aspiration PNA--  PLAN -  ceftaz monotherapy, limit course  in setting cdiff Sputum remains cultur eneg narrow to ceftriaxone, add stop date  Hypotension/ septic shock  PLAN -  cvp noted, accuracy>? Continue lasix,  Consider increase  Encephalopathy -- likely TME r/t sepsis PLAN -  WUA Chair position   Coagulopathy - in setting coumadin for Afib / sepsis PLAN -  inr corrected With in / out fib, would favor heparin, agree  Afib rvr - Cards following.   amio bolus May need cardizem added Hep drip  Chronic diastolic CHF/ mitral regurg - Cards following.  EF55-60% on echo 10/2011 PLAN -  Lasix maintain, incresase  Hyponatremia - hypoK, Mgm, phos PLAN -  Replace k  repalce mg, phos ( in fib) Recheck in am  Anemia --  PLAN -  F/u cbc in am  No role tx Plat rising with lasix Cbc in am   Malnurished, severe Attempt trickle feeds, avoid TPN ppi oxepa - ARDS Hold for weaning  Best practices / Disposition: -->ICU status under PCCM -->full code -->SCD's and sub q 3/19 for DVT Px -->Protonix  for GI Px -->ventilator bundle / ARDS   Nelda Bucks., MD 11/28/2011  9:33 AM Pager: [phone]  *Care during the described time interval was provided by me and/or other providers on the critical care team. I have reviewed this patient's available data, including medical history, events of note, physical examination and test results as part of my evaluation.   Ccm 30 min   Mcarthur Rossetti. Tyson Alias, MD, FACP Pgr: [phone] [facility]

## 2011-11-28 NOTE — Progress Notes (Addendum)
  Echocardiogram 2D Echocardiogram (limited) has been performed.  Jorje Guild [facility] 11/28/2011, 2:39 PM

## 2011-11-28 NOTE — Progress Notes (Signed)
    Subjective:  The patient remains intubated. She is much more awake and alert this morning. She denies chest pain or dyspnea. She denies abdominal pain.  Objective:  Vital Signs in the last 24 hours: Temp:  [97 F (36.1 C)-98.5 F (36.9 C)] 98.5 F (36.9 C) (03/21 0729) Pulse Rate:  [56-140] 94  (03/21 0811) Resp:  [17-35] 28  (03/21 0811) BP: (103-136)/(47-63) 136/63 mmHg (03/21 0811) SpO2:  [96 %-100 %] 98 % (03/21 0811) Arterial Line BP: (102-135)/(42-63) 127/59 mmHg (03/21 0800) FiO2 (%):  [39.2 %-40.3 %] 40 % (03/21 0812) Weight:  [53.6 kg (118 lb 2.7 oz)] 53.6 kg (118 lb 2.7 oz) (03/21 0500)  Intake/Output from previous day: 03/20 0701 - 03/21 0700 In: 2002.8 [I.V.:940.8; NG/GT:380; IV Piggyback:652] Out: 3035 [Urine:3035]  Physical Exam: Pt is alert and oriented, NAD, intubated. HEENT: normal Neck: JVP - normal Lungs: Coarse bilateral breath sounds with improved aeration bilaterally CV: RRR without murmur or gallop Abd: soft, NT, Positive BS, no hepatomegaly Ext: Diffuse peripheral edema Skin: warm/dry no rash  Lab Results:  Basename 11/28/11 0400 11/27/11 0430  WBC 27.3* 27.9*  HGB 7.8* 8.2*  PLT 88* 84*    Basename 11/28/11 0400 11/27/11 1225  NA 134* 133*  K 3.2* 3.2*  CL 98 98  CO2 30 27  GLUCOSE 112* 110*  BUN 3* <3*  CREATININE 0.39* 0.40*   No results found for this basename: TROPONINI:2,CK,MB:2 in the last 72 hours  Cardiac Studies: Impressions:  - Normal LV size and systolic function, EF 55-60%. Septal bounce consistent with prior cardiac surgery. The mitral valve has been repaired with an Alfieri-type stitch and annuloplasty ring. There is no significant mitral regurgitation. There are two mitral inflow jets consistent with Alfieri repair. Doppler measurements were done on only one of the jets. Pressure half-time suggested mild to moderate mitral stenosis. This measurement is hard to interpret as only 1 jet was interrogated. Would  repeat study ina few months once we're out of the immediate post-op period to reassess. Normal RV size and systolic function with mild pulmonary hypertension.  Tele: Sinus rhythm with episodes of atrial fib with RVR lasting up to one hour  Assessment/Plan:  1. Acute respiratory failure.  The patient appears to be improving slowly. She looks comfortable on an FiO2 of 40%. Her chest x-ray continues to show patchy bilateral interstitial opacities. Management per CCM. 2. C. difficile colitis. Patient remains on Flagyl and vancomycin. 3. Paroxysmal atrial fibrillation with RVR. Recommend continue IV amiodarone as the patient is having periodic atrial fib in the setting of her critical illness. I am going to write for when necessary IV diltiazem boluses when she has breakthrough episodes. I'm going to at try to avoid starting her on continuous IV diltiazem if possible. Recommend starting IV heparin for thromboembolic prevention.  Tonny Bollman, M.D. 11/28/2011, 8:33 AM

## 2011-11-28 NOTE — Progress Notes (Signed)
ANTICOAGULATION CONSULT NOTE - Initial Consult  Pharmacy Consult for heparin Indication: atrial fibrillation  Assessment: 56 year old female with severe mitral regurgitation s/p MVR in February. Patient presented with coagulapathy on warfarin as outpatient for afib, this has since been corrected, INR is currently stable at 1.5. Patient continues to go in/out of atrial fibrillation, patient has been on sq heparin for px and last received a dose this morning.  Full dose Heparin was started this morning and her first Heparin level is therapeutic.  Goal of Therapy:  Heparin level 0.3-0.7 units/ml   Plan:  Continue Heparin at 750 units/hr Recheck HL in 6 hours CBC daily.  Allergies  Allergen Reactions  . Compazine Other (See Comments)    "eyes get buggy"    Patient Measurements: Height: 4\' 11"  (149.9 cm) Weight: 118 lb 2.7 oz (53.6 kg) IBW/kg (Calculated) : 43.2  Heparin Dosing Weight: 50kg  Vital Signs: Temp: 98 F (36.7 C) (03/21 1547) Temp src: Oral (03/21 1547) BP: 139/69 mmHg (03/21 1203) Pulse Rate: 95  (03/21 1600)  Labs:  Basename 11/28/11 1645 11/28/11 0400 11/27/11 1225 11/27/11 0430 11/26/11 0440  HGB -- 7.8* -- 8.2* --  HCT -- 24.2* -- 24.4* 25.6*  PLT -- 88* -- 84* 77*  APTT -- 33 -- 33 --  LABPROT -- 18.6* -- 17.7* 18.8*  INR -- 1.52* -- 1.43 1.54*  HEPARINUNFRC 0.41 -- -- -- --  CREATININE -- 0.39* 0.40* 0.39* --  CKTOTAL -- -- -- 22 --  CKMB -- -- -- -- --  TROPONINI -- -- -- -- --   Estimated Creatinine Clearance: 60.9 ml/min (by C-G formula based on Cr of 0.39).  Medical History: Past Medical History  Diagnosis Date  . 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 MVP (mitral valve prolapse)   . Mitral regurgitation   . Shortness of breath   . Heart murmur   . Neuromuscular disorder     numbness in hands and feet  . Arthritis   . Overactive bladder   . Pleural effusion, left 11/11/2011    Estella Husk, Pharm.D.,  BCPS Clinical Pharmacist  Pager [phone] 11/28/2011, 5:51 PM

## 2011-11-28 NOTE — Progress Notes (Signed)
eLink Physician-Brief Progress Note Patient Name: Michaela [name] DOB: November 16, 1955 MRN: [id number]  Date of Service  11/28/2011   HPI/Events of Note   Hypokalemia  eICU Interventions  Potassium replaced   Intervention Category Minor Interventions: Electrolytes abnormality - evaluation and management  Griselda Tosh,[place] 11/28/2011, 5:35 AM

## 2011-11-28 NOTE — Progress Notes (Signed)
Utilization review completed.  

## 2011-11-28 NOTE — Progress Notes (Signed)
eLink Physician-Brief Progress Note Patient Name: SATARA VIRELLA DOB: 1955-12-12 MRN: [id number]  Date of Service  11/28/2011   HPI/Events of Note  Ongoing AF with RVR on amiodarone gtt having received several amio boluses.  Current BP of 108/61 (77).  HR range 120s to 150s.   eICU Interventions  Plan: Suspect saturated with amio. Will try 1 time bolus of 10 mg diltiazem.   Intervention Category Intermediate Interventions: Arrhythmia - evaluation and management  Kamber Vignola,Briseida 11/28/2011, 1:51 AM

## 2011-11-28 NOTE — Progress Notes (Signed)
ANTICOAGULATION CONSULT NOTE - Initial Consult  Pharmacy Consult for heparin Indication: atrial fibrillation  Assessment: 56 year old female with severe mitral regurgitation s/p MVR in February. Patient presented with coagulapathy on warfarin as outpatient for afib, this has since been corrected, INR is currently stable at 1.5. Patient continues to go in/out of atrial fibrillation, patient has been on sq heparin for px will increase to full dose IV heparin today. Morning dose of sq heparin was given.  Aspiration pneumonia - antibiotics narrowed to ceftriaxone today, limiting course in setting of c.diff. 4 days of therapy ordered. No fevers noted, but still with leukocytosis with wbc holding steady at 27.  C.diff - patient continues on po/pr vancomycin, she continues to have watery brown diarrhea. No changes at this time.  Goal of Therapy:  Heparin level 0.3-0.7 units/ml   Plan:  Heparin bolus of 1000 unts x 1 then drip at 750 units/hr Check HL in 6 hours Continue vancomycin po/pr D/c fortaz - ceftriaxone x 4d  CBC daily. Allergies  Allergen Reactions  . Compazine Other (See Comments)    "eyes get buggy"    Patient Measurements: Height: 4\' 11"  (149.9 cm) Weight: 118 lb 2.7 oz (53.6 kg) IBW/kg (Calculated) : 43.2  Heparin Dosing Weight: 50kg  Vital Signs: Temp: 98.5 F (36.9 C) (03/21 0729) Temp src: Oral (03/21 0729) BP: 136/63 mmHg (03/21 0811) Pulse Rate: 97  (03/21 1000)  Labs:  Basename 11/28/11 0400 11/27/11 1225 11/27/11 0430 11/26/11 0440  HGB 7.8* -- 8.2* --  HCT 24.2* -- 24.4* 25.6*  PLT 88* -- 84* 77*  APTT 33 -- 33 --  LABPROT 18.6* -- 17.7* 18.8*  INR 1.52* -- 1.43 1.54*  HEPARINUNFRC -- -- -- --  CREATININE 0.39* 0.40* 0.39* --  CKTOTAL -- -- 22 --  CKMB -- -- -- --  TROPONINI -- -- -- --   Estimated Creatinine Clearance: 60.9 ml/min (by C-G formula based on Cr of 0.39).  Medical History: Past Medical History  Diagnosis Date  . Brain tumor 1971     astrocytoma treated with radiation  . MVP (mitral valve prolapse)   . Mitral regurgitation   . Shortness of breath   . Heart murmur   . Neuromuscular disorder     numbness in hands and feet  . Arthritis   . Overactive bladder   . Pleural effusion, left 11/11/2011     Michaela [name] 11/28/2011,10:21 AM

## 2011-11-29 ENCOUNTER — Inpatient Hospital Stay (HOSPITAL_COMMUNITY): Payer: Medicare Other

## 2011-11-29 DIAGNOSIS — J90 Pleural effusion, not elsewhere classified: Secondary | ICD-10-CM

## 2011-11-29 DIAGNOSIS — I959 Hypotension, unspecified: Secondary | ICD-10-CM

## 2011-11-29 LAB — DIFFERENTIAL
Basophils Absolute: 0.4 10*3/uL — ABNORMAL HIGH (ref 0.0–0.1)
Basophils Relative: 1 % (ref 0–1)
Lymphocytes Relative: 5 % — ABNORMAL LOW (ref 12–46)
Monocytes Relative: 5 % (ref 3–12)
Neutro Abs: 31.4 10*3/uL — ABNORMAL HIGH (ref 1.7–7.7)

## 2011-11-29 LAB — CBC
HCT: 25.2 % — ABNORMAL LOW (ref 36.0–46.0)
MCH: 27.2 pg (ref 26.0–34.0)
MCHC: 32.5 g/dL (ref 30.0–36.0)
MCV: 83.4 fL (ref 78.0–100.0)
RDW: 18.1 % — ABNORMAL HIGH (ref 11.5–15.5)

## 2011-11-29 LAB — COMPREHENSIVE METABOLIC PANEL
ALT: 12 U/L (ref 0–35)
AST: 32 U/L (ref 0–37)
Alkaline Phosphatase: 97 U/L (ref 39–117)
GFR calc Af Amer: >90 mL/min (ref >90)
Glucose, Bld: 106 mg/dL — ABNORMAL HIGH (ref 70–99)
Potassium: 4.3 mEq/L (ref 3.5–5.1)
Sodium: 134 mEq/L — ABNORMAL LOW (ref 135–145)
Total Protein: 4.2 g/dL — ABNORMAL LOW (ref 6.0–8.3)

## 2011-11-29 LAB — GLUCOSE, CAPILLARY
Glucose-Capillary: 115 mg/dL — ABNORMAL HIGH (ref 70–99)
Glucose-Capillary: 104 mg/dL — ABNORMAL HIGH (ref 70–99)

## 2011-11-29 LAB — PROTIME-INR: Prothrombin Time: 19 seconds — ABNORMAL HIGH (ref 11.6–15.2)

## 2011-11-29 MED ORDER — FUROSEMIDE 10 MG/ML IJ SOLN
40.0000 mg | Freq: Once | INTRAMUSCULAR | Status: AC
  Administered 2011-11-29: 40 mg via INTRAVENOUS
  Filled 2011-11-29: qty 4

## 2011-11-29 NOTE — Progress Notes (Signed)
Patient Name: Michaela [name] Date of Encounter: 11/29/2011  Principal Problem:  *SIRS (systemic inflammatory response syndrome) Active Problems:  Mitral regurgitation due to cusp prolapse  S/P mitral valve repair  Brain tumor, astrocytoma  Neuromuscular disorder  Diastolic CHF, chronic  Diarrhea  Hypotension  Encephalopathy acute  Hyponatremia  Dehydration  Leukocytosis  Chest pain  Anemia  Atrial fibrillation with RVR  C. difficile colitis  Septic shock  Acute respiratory failure    SUBJECTIVE:  Pt denies chest pain and SOB is OK. She has been extubated and her throat is sore but she is glad to have the tube out.   OBJECTIVE  Filed Vitals:   11/29/11 0800 11/29/11 0900 11/29/11 0909 11/29/11 1000  BP:   136/68   Pulse: 84 100 99 96  Temp: 98.4 F (36.9 C)     TempSrc: Oral     Resp: 20 24 24 18   Height:      Weight:      SpO2: 99% 98% 93% 99%    Intake/Output Summary (Last 24 hours) at 11/29/11 1206 Last data filed at 11/29/11 1000  Gross per 24 hour  Intake 2035.4 ml  Output   5415 ml  Net -3379.6 ml   Weight change: -5 lb 8.2 oz (-2.5 kg) Wt Readings from Last 3 Encounters:  11/29/11 112 lb 10.5 oz (51.1 kg)  11/20/11 80 lb 1.9 oz (36.342 kg)  11/13/11 88 lb (39.917 kg)     PHYSICAL EXAM  General: Slender, frail female, in no acute distress. Head: Normocephalic, atraumatic.  Neck: Supple without bruits, JVD at approx 8 cm but diff to assess because of IV on left. Lungs:  Resp regular and unlabored, bibasilar rales. Heart: RRR, S1, S2, no S3, S4, or murmurs. Abdomen: Soft, non-tender, non-distended, BS present  Extremities: No clubbing, cyanosis, 1-2+ edema bilateral LE.  Neuro: Alert and oriented    LABS: CBC: Basename 11/29/11 0424 11/28/11 0400  WBC 35.8* 27.3*  NEUTROABS 31.4* 24.8*  HGB 8.2* 7.8*  HCT 25.2* 24.2*  MCV 83.4 84.0  PLT 73* 88*   INR: Basename 11/29/11 0424  INR 1.56*   Basic Metabolic Panel: Basename  11/29/11 0424 11/28/11 0400 11/27/11 1225 11/26/11 1315  NA 134* 134* -- --  K 4.3 3.2* -- --  CL 96 98 -- --  CO2 32 30 -- --  GLUCOSE 106* 112* -- --  BUN 5* 3* -- --  CREATININE 0.37* 0.39* -- --  CALCIUM 7.8* 7.5* -- --  MG -- -- 1.7 1.6  PHOS -- -- 1.5* 2.7   Liver Function Tests: Basename 11/29/11 0424 11/27/11 0430  AST 32 25  ALT 12 10  ALKPHOS 97 85  BILITOT 0.3 0.3  PROT 4.2* 4.1*  ALBUMIN 1.6* 1.5*    Cardiac Enzymes: Basename 11/29/11 0424 11/27/11 0430  CKTOTAL 23 22  CKMB -- --  CKMBINDEX -- --  TROPONINI -- --   BNP: Pro B Natriuretic peptide (BNP)  Date/Time Value Range Status  11/25/2011  3:20 PM 1958.0* 0-125 (pg/mL) Final  11/11/2011  2:14 PM 6897.00* <126 (pg/mL) Final    TELE:  SR, PACs      Radiology/Studies: Dg Chest Port 1 View 11/29/2011  *RADIOLOGY REPORT*  Clinical Data: Assess ARDS, infiltrates and edema  PORTABLE CHEST - 1 VIEW  Comparison: 11/28/2011  Findings: The endotracheal tube tip is located 3.3 cm above the level of the carina.  A left internal jugular CVP is in place with the tip  located in the right atrium.  This can be pulled back as much as 5 cm to ensure positioning in the region of the superior cavoatrial junction. The nasogastric tip is located below the level of the diaphragm and not visualized on this film.  Persistent bilateral alveolar infiltrates are seen predominately involving the left lung base and more diffusely throughout the right hemithorax. Previously noted small left pleural effusion is less obvious today.  Some improved aeration in the left midlung zone is noted in comparison with the previous exam. Pulmonary vascular congestion with no signs of overt congestive failure are seen.  Some superimposed interstitial edema may be present.  IMPRESSION: Some improvement in aeration in the left midlung zone.  Otherwise unchanged cardiopulmonary appearance.  Original Report Authenticated By: Bertha Stakes, M.D.   Dg Chest  Port 1 View 11/28/2011  *RADIOLOGY REPORT*  Clinical Data: Check ET tube  PORTABLE CHEST - 1 VIEW  Comparison: 11/27/2011  Findings: Endotracheal tube terminates 2 cm above the carina.  Patchy bilateral interstitial/airspace opacities, right greater than left, compatible with pneumonia. Underlying focal opacity in the right upper lobe.  Small left pleural effusion.  No pneumothorax.  Stable left IJ venous catheter.  Enteric tube coursing below the diaphragm.  IMPRESSION: Endotracheal tube terminates 2 cm above the carina.  Patchy bilateral interstitial/airspace opacities, compatible with pneumonia.  Underlying focal opacity in the right upper lobe, possibly infectious.  Follow-up radiographs are suggested to document resolution.  Original Report Authenticated By: Charline Bills, M.D.   Dg Chest Port 1 View 11/27/2011  *RADIOLOGY REPORT*  Clinical Data: Endotracheal tube  PORTABLE CHEST - 1 VIEW  Comparison: Chest radiograph 11/26/2011  Findings: Endotracheal tube is positioned 3 cm from carina unchanged.  NG tube and left central venous line are unchanged. Patient is rotated leftward.  Stable cardiac silhouette.  The are bilateral pleural effusions, left greater than right which are stable.  There is bilateral air space disease which is slightly less dense than prior.  No pneumothorax.  IMPRESSION:  1.  Some improvement in dense bilateral air space disease. 2.  Stable pleural effusions. 3.  Stable support apparatus.  Original Report Authenticated By: Genevive Bi, M.D.     Current Medications:    Scheduled Meds:   . cefTRIAXone (ROCEPHIN)  IV  1 g Intravenous Daily  . famotidine  20 mg Oral Daily  . insulin aspart  0-9 Units Subcutaneous Q4H  . magnesium sulfate 1 - 4 g bolus IVPB  4 g Intravenous Once  . metronidazole  500 mg Intravenous Q6H  . potassium chloride  40 mEq Per Tube Q4H  . potassium phosphate IVPB (mmol)  20 mmol Intravenous Once  . SAILS Study - rosuvastatin / placebo  (PI-Wright)  20 mg Oral Daily  . sodium chloride  3 mL Intravenous Q12H  . vancomycin  500 mg Oral Q6H   Continuous Infusions:   . sodium chloride 20 mL/hr at 11/26/11 2356  . amiodarone (NEXTERONE PREMIX) 360 mg/200 mL dextrose 60 mg/hr (11/29/11 0932)  . heparin 750 Units/hr (11/28/11 1132)   PRN Meds:.acetaminophen, alum & mag hydroxide-simeth, diltiazem 10 mg IV q 1 hr PRN, levalbuterol, ondansetron (ZOFRAN) IV   ASSESSMENT AND PLAN: 1. Acute respiratory failure. The patient appears to be improving slowly. Her chest x-ray continues to show patchy bilateral interstitial opacities. Management per CCM. Pt was extubated 3/22.  2. C. difficile colitis. Patient remains on Flagyl and vancomycin.   3. Paroxysmal atrial fibrillation with RVR. Recommend continue  IV amiodarone as the patient is having periodic atrial fib in the setting of her critical illness. She has when necessary IV diltiazem boluses for breakthrough episodes. 2 doses have been given so far. I'm going to try to avoid starting her on continuous IV diltiazem if possible.   4. Coagulopathy - 3/15 INR 4.61, Now on IV heparin for thromboembolic prevention. Consider coumadin once taking POs better if otherwise stable.  5. Otherwise, per CCM.   Signed, Theodore Demark , PA-C 12:06 PM 11/29/2011   Patient seen, examined. Available data reviewed. Agree with findings, assessment, and plan as outlined by Theodore Demark, PA-C. Pt has been extubated and is improving. She denies chest pain or shortness of breath. Tele reviewed and she is having less breakthrough afib. Continue current therapy with IV amio and prn cardizem. Start warfarin when taking PO, and continue heparin until INR therapeutic.  Tonny Bollman, M.D. 11/29/2011 2:14 PM

## 2011-11-29 NOTE — Progress Notes (Signed)
Nutrition Follow-up  Pt extubated this AM. EN via OGT discontinued with extubation; pt with sore throat per PA-C note.  Diet Order:  NPO  Meds: Scheduled Meds:   . cefTRIAXone (ROCEPHIN)  IV  1 g Intravenous Daily  . famotidine  20 mg Oral Daily  . insulin aspart  0-9 Units Subcutaneous Q4H  . metronidazole  500 mg Intravenous Q6H  . potassium chloride  40 mEq Per Tube Q4H  . potassium phosphate IVPB (mmol)  20 mmol Intravenous Once  . SAILS Study - rosuvastatin / placebo (PI-Wright)  20 mg Oral Daily  . sodium chloride  3 mL Intravenous Q12H  . vancomycin  500 mg Oral Q6H  . DISCONTD: antiseptic oral rinse  15 mL Mouth Rinse QID  . DISCONTD: chlorhexidine  15 mL Mouth Rinse BID  . DISCONTD: feeding supplement (OXEPA)  1,000 mL Per Tube Q24H  . DISCONTD: vancomycin (VANCOCIN) rectal enema  500 mg Rectal Q6H   Continuous Infusions:   . sodium chloride 20 mL/hr at 11/26/11 2356  . amiodarone (NEXTERONE PREMIX) 360 mg/200 mL dextrose 60 mg/hr (11/29/11 0932)  . heparin 750 Units/hr (11/28/11 1132)   PRN Meds:.acetaminophen, alum & mag hydroxide-simeth, diltiazem, levalbuterol, ondansetron (ZOFRAN) IV, DISCONTD: fentaNYL, DISCONTD: furosemide, DISCONTD: midazolam  Labs:  CMP     Component Value Date/Time   NA 134* 11/29/2011 0424   K 4.3 11/29/2011 0424   CL 96 11/29/2011 0424   CO2 32 11/29/2011 0424   GLUCOSE 106* 11/29/2011 0424   BUN 5* 11/29/2011 0424   CREATININE 0.37* 11/29/2011 0424   CREATININE 0.55 11/11/2011 1414   CALCIUM 7.8* 11/29/2011 0424   PROT 4.2* 11/29/2011 0424   ALBUMIN 1.6* 11/29/2011 0424   AST 32 11/29/2011 0424   ALT 12 11/29/2011 0424   ALKPHOS 97 11/29/2011 0424   BILITOT 0.3 11/29/2011 0424   GFRNONAA >90 11/29/2011 0424   GFRAA >90 11/29/2011 0424     Intake/Output Summary (Last 24 hours) at 11/29/11 1459 Last data filed at 11/29/11 1000  Gross per 24 hour  Intake   1883 ml  Output   4915 ml  Net  -3032 ml    CBG (last 3)   Basename 11/29/11 0806  11/29/11 0407 11/29/11 0018  GLUCAP 104* 115* 105*    Weight Status:  51.1 kg (3/22) -- trending down  Re-estimated needs:  1300-1500 kcals, 68-78 gm protein  Nutrition Dx:  Inadequate Oral Intake r/t inability to eat, s/p extubation.  Status: Ongoing  Goal:  Meet >90% of estimated nutrition needs via oral diet vs nutrition support, currently unmet  Intervention/Plan:    Advance diet as medically appropriate  RD to follow, add interventions/make recommendations accordingly  Alger Memos Pager #:  [phone]

## 2011-11-29 NOTE — Progress Notes (Signed)
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  She was admitted 3/14 by [facility] with CDiff colitis and hypotension, PCCM was consulted. Hypotension initially improved with fluid resuscitation and PCCM signed off.  On 3/14 developed worsening hypotension, obtundation and resp failure and PCCM called back to assume ICU care.   Lines / Drains: ETT 3/18>>> Left IJ 3/18>>> Rt fem line 3/18>>>  Cultures: Urine 3/14>>> neg BCx2 3/14>>>neg CDiff 3/14>>> POS Stool 3/14>>>neg BCx2 3/18>>> Urine 3/18>>> Sputum 3/19>>>  Antibiotics: Flagyl (CDiff) 3/14>>> Vanc (oral - CDiff) 3/14>>> Vanc (enema-Cdiff) 3/18>>> Ceftaz (?asp PNA) 3/18>>>3/21 Ceftriaxone 3/21>>>plan stop 3/24  Tests / Events: 3/14 resp failure, hypotension, intubated 3.18 CT abdo/pelvis -Prominent wall thickening throughout most of the colon, which certainly could be a secondary finding from C. difficile colitis. Bilateral small pleural effusions with bibasilar consolidation. Extensive subcutaneous and mesenteric edema, with ascites.  4. Portions of the VP shunt may be discontinuous. 3/18 Ct head -Stable appearance of the posterior fossa and of the lateral  ventricles, with continued white matter hypodensities in the  parietal lobes.  2. Right lateral ventricular shunt noted.  3. No acute intracranial findings. 3/18 - ARDS 3/19 -off pressors 3/20 afib rvr, neg 4 lit!! 3/21- neg 1 lit, improved vent 3/20 echo - 55%, mitral appears good 3/21- neg 3 lit  Vital Signs: Temp:  [98 F (36.7 C)-98.9 F (37.2 C)] 98.2 F (36.8 C) (03/22 0400) Pulse Rate:  [55-99] 76  (03/22 0736) Resp:  [19-34] 22  (03/22 0736) BP: (117-139)/(57-71) 121/60 mmHg (03/22 0736) SpO2:  [96 %-100 %] 100 % (03/22 0736) Arterial Line BP:  (112-140)/(55-73) 119/57 mmHg (03/22 0700) FiO2 (%):  [39.4 %-40.5 %] 40 % (03/22 0736) Weight:  [51.1 kg (112 lb 10.5 oz)] 51.1 kg (112 lb 10.5 oz) (03/22 0500) I/O last 3 completed shifts: In: 4096.7 [I.V.:1568.7; Other:80; NG/GT:780; IV Piggyback:1668] Out: 8170 [Urine:8170]  Physical Examination:  General: chronically ill appearing, NAD HEENT: mm dry, line clean Neuro: follows commands int, rass 0 Lungs: slight coarse Cards: s1s2 ireg, RVR now 136 Abd: firm, less distended,  Increased bs Ext: warm and dry, groin and abdoedema   Ventilator settings: Vent Mode:  [-] PSV FiO2 (%):  [39.4 %-40.5 %] 40 % Set Rate:  [20 bmp] 20 bmp Vt Set:  [300 mL] 300 mL PEEP:  [4.7 cmH20-5 cmH20] 5 cmH20 Pressure Support:  [5 cmH20-8 cmH20] 5 cmH20 Plateau Pressure:  [0 cmH20-17 cmH20] 17 cmH20  Labs and Imaging:   CBC    Component Value Date/Time   WBC 35.8* 11/29/2011 0424   RBC 3.02* 11/29/2011 0424   HGB 8.2* 11/29/2011 0424   HCT 25.2* 11/29/2011 0424   PLT 73* 11/29/2011 0424   MCV 83.4 11/29/2011 0424   MCH 27.2 11/29/2011 0424   MCHC 32.5 11/29/2011 0424   RDW 18.1* 11/29/2011 0424   LYMPHSABS 1.8 11/29/2011 0424   MONOABS 1.8* 11/29/2011 0424   EOSABS 0.4 11/29/2011 0424   BASOSABS 0.4* 11/29/2011 0424    BMET    Component Value Date/Time   NA 134* 11/29/2011 0424   K 4.3 11/29/2011 0424   CL 96 11/29/2011 0424   CO2 32 11/29/2011 0424   GLUCOSE 106* 11/29/2011 0424   BUN 5* 11/29/2011 0424   CREATININE 0.37*  11/29/2011 0424   CREATININE 0.55 11/11/2011 1414   CALCIUM 7.8* 11/29/2011 0424   GFRNONAA >90 11/29/2011 0424   GFRAA >90 11/29/2011 0424     Lab 11/29/11 0424  INR 1.56*    ABG    Component Value Date/Time   PHART 7.498* 11/28/2011 0920   PCO2ART 38.6 11/28/2011 0920   PO2ART 83.0 11/28/2011 0920   HCO3 30.0* 11/28/2011 0920   TCO2 31 11/28/2011 0920   ACIDBASEDEF 1.3 11/26/2011 1845   O2SAT 97.0 11/28/2011 0920    Dg Chest Port 1 View  11/29/2011  *RADIOLOGY REPORT*   Clinical Data: Assess ARDS, infiltrates and edema  PORTABLE CHEST - 1 VIEW  Comparison: 11/28/2011  Findings: The endotracheal tube tip is located 3.3 cm above the level of the carina.  A left internal jugular CVP is in place with the tip located in the right atrium.  This can be pulled back as much as 5 cm to ensure positioning in the region of the superior cavoatrial junction. The nasogastric tip is located below the level of the diaphragm and not visualized on this film.  Persistent bilateral alveolar infiltrates are seen predominately involving the left lung base and more diffusely throughout the right hemithorax. Previously noted small left pleural effusion is less obvious today.  Some improved aeration in the left midlung zone is noted in comparison with the previous exam. Pulmonary vascular congestion with no signs of overt congestive failure are seen.  Some superimposed interstitial edema may be present.  IMPRESSION: Some improvement in aeration in the left midlung zone.  Otherwise unchanged cardiopulmonary appearance.  Original Report Authenticated By: Bertha Stakes, M.D.   Dg Chest Port 1 View  11/28/2011  *RADIOLOGY REPORT*  Clinical Data: Check ET tube  PORTABLE CHEST - 1 VIEW  Comparison: 11/27/2011  Findings: Endotracheal tube terminates 2 cm above the carina.  Patchy bilateral interstitial/airspace opacities, right greater than left, compatible with pneumonia. Underlying focal opacity in the right upper lobe.  Small left pleural effusion.  No pneumothorax.  Stable left IJ venous catheter.  Enteric tube coursing below the diaphragm.  IMPRESSION: Endotracheal tube terminates 2 cm above the carina.  Patchy bilateral interstitial/airspace opacities, compatible with pneumonia.  Underlying focal opacity in the right upper lobe, possibly infectious.  Follow-up radiographs are suggested to document resolution.  Original Report Authenticated By: Charline Bills, M.D.    Assessment and Plan:  Acute  Respiratory failure - large edema component, abd distension and likely aspiration PNA, ARDS PLAN -  Big component edema,mitral appears well,appreciate cvts interpretation, agree Wean this am cpap 5 ps 5 goal 30 min,hope for extubation today pcxr techniuque little different to interpret pcxr in am  Goal neg 3 lit again today Continue low TV ventilation   CDiff colitis -- worsening, with shock resolved PLAN -  Continue vanc enema, will consider dc in am  Continue oral vanc, flagyl, has responded to this Wbc hemoconcetration ,as stools reduced clinically If clinically cdiff worsen change to fodaximicin  Aspiration PNA--  PLAN -  ceftaz monotherapy, limit course in setting cdiff,especailly as edema appears to be a big component Sputum remains cultur eneg narrow to ceftriaxone, to 7 days total  Hypotension/ septic shock  PLAN -  cvp noted, accuracy>? Now 11, some from rvr diastolic dysfunction from rvr Continue lasix, admin 40 per sails, agree  Encephalopathy -- resolved PLAN -  WUA Chair position successful  Coagulopathy - in setting coumadin for Afib / sepsis PLAN -  Hep per  pharmacy Consider coumadin start  Afib rvr - Cards following.   amio bolus May need cardizem prn now, rvr should NOT limit extubation weaning Hep drip  Chronic diastolic CHF/ mitral regurg - Cards following.  EF55-60% on echo 10/2011 PLAN -  Lasix maintain, incresase to 40 ,agree Follow Co2 on rise on chem  Hyponatremia - hypoK, Mgm, phos PLAN -  chem in am  kvo Lasix  Anemia --  PLAN -  F/u cbc in am  Hep drip  Malnurished, severe Attempt trickle feeds, avoid TPN ppi Hold for weaning  Best practices / Disposition: -->ICU status under PCCM -->full code -->SCD's and sub q 3/19 for DVT Px -->Protonix for GI Px -->ventilator bundle / ARDS   Nelda Bucks., MD 11/29/2011  8:30 AM Pager: [phone]  *Care during the described time interval was provided by me and/or other  providers on the critical care team. I have reviewed this patient's available data, including medical history, events of note, physical examination and test results as part of my evaluation.   Ccm 30 min   Mcarthur Rossetti. Tyson Alias, MD, FACP Pgr: [phone] [facility]

## 2011-11-29 NOTE — Progress Notes (Signed)
ANTICOAGULATION CONSULT NOTE - Initial Consult  Pharmacy Consult for heparin Indication: atrial fibrillation  Allergies  Allergen Reactions  . Compazine Other (See Comments)    "eyes get buggy"    Patient Measurements: Height: 4\' 11"  (149.9 cm) Weight: 118 lb 2.7 oz (53.6 kg) IBW/kg (Calculated) : 43.2  Heparin Dosing Weight: 50kg  Vital Signs: Temp: 98.9 F (37.2 C) (03/22 0000) Temp src: Oral (03/22 0000) BP: 133/71 mmHg (03/22 0046) Pulse Rate: 90  (03/22 0100)  Labs:  Basename 11/29/11 0018 11/28/11 1645 11/28/11 0400 11/27/11 1225 11/27/11 0430 11/26/11 0440  HGB -- -- 7.8* -- 8.2* --  HCT -- -- 24.2* -- 24.4* 25.6*  PLT -- -- 88* -- 84* 77*  APTT -- -- 33 -- 33 --  LABPROT -- -- 18.6* -- 17.7* 18.8*  INR -- -- 1.52* -- 1.43 1.54*  HEPARINUNFRC 0.41 0.41 -- -- -- --  CREATININE -- -- 0.39* 0.40* 0.39* --  CKTOTAL -- -- -- -- 22 --  CKMB -- -- -- -- -- --  TROPONINI -- -- -- -- -- --   Estimated Creatinine Clearance: 60.9 ml/min (by C-G formula based on Cr of 0.39).  Assessment: 56 year old female with atrial fibrillation for Heparin  Goal of Therapy:  Heparin level 0.3-0.7 units/ml   Plan:  Continue Heparin at 750 units/hr  Geannie Risen, PharmD, BCPS  11/29/2011, 1:26 AM

## 2011-11-29 NOTE — Progress Notes (Signed)
eLink Physician-Brief Progress Note Patient Name: Michaela [name] DOB: 1956-02-27 MRN: [id number]  Date of Service  11/29/2011   HPI/Events of Note   Return of A-fib with stable BP , MAP 60.  Patient not in distress.  eICU Interventions  Check BMP, Mag, Phos.  Consider small IVF bolus vs lopressor IV if fails to respond to electrolyte repletion.    Intervention Category Major Interventions: Arrhythmia - evaluation and management  Gradie Ohm 11/29/2011, 11:14 PM

## 2011-11-29 NOTE — Progress Notes (Signed)
ANTICOAGULATION CONSULT NOTE - Initial Consult  Pharmacy Consult for heparin Indication: atrial fibrillation  Assessment: 56 year old female with severe mitral regurgitation s/p MVR in February. Patient presented with coagulapathy on warfarin as outpatient for afib, this has since been corrected, INR is currently 1.56. Patient continues to go in/out of atrial fibrillation, patient has been on sq heparin for px and last received a dose 3/21 AM.  Full dose Heparin was started 3/21.  Heparin level therapeutic this AM.  Goal of Therapy:  Heparin level 0.3-0.7 units/ml   Plan:  Continue Heparin at 750 units/hr Heparin level and CBC daily F/U restart of Coumadin (extubated this AM)  Allergies  Allergen Reactions  . Compazine Other (See Comments)    "eyes get buggy"    Patient Measurements: Height: 4\' 11"  (149.9 cm) Weight: 112 lb 10.5 oz (51.1 kg) IBW/kg (Calculated) : 43.2  Heparin Dosing Weight: 50kg  Vital Signs: Temp: 98.2 F (36.8 C) (03/22 0400) Temp src: Oral (03/22 0400) BP: 136/68 mmHg (03/22 0909) Pulse Rate: 99  (03/22 0909)  Labs:  Basename 11/29/11 0424 11/29/11 0018 11/28/11 1645 11/28/11 0400 11/27/11 1225 11/27/11 0430  HGB 8.2* -- -- 7.8* -- --  HCT 25.2* -- -- 24.2* -- 24.4*  PLT 73* -- -- 88* -- 84*  APTT -- -- -- 33 -- 33  LABPROT 19.0* -- -- 18.6* -- 17.7*  INR 1.56* -- -- 1.52* -- 1.43  HEPARINUNFRC 0.49 0.41 0.41 -- -- --  CREATININE 0.37* -- -- 0.39* 0.40* --  CKTOTAL 23 -- -- -- -- 22  CKMB -- -- -- -- -- --  TROPONINI -- -- -- -- -- --   Estimated Creatinine Clearance: 55.5 ml/min (by C-G formula based on Cr of 0.37).  Medical History: Past Medical History  Diagnosis Date  . 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 MVP (mitral valve prolapse)   . Mitral regurgitation   . Shortness of breath   . Heart murmur   . Neuromuscular disorder     numbness in hands and feet  . Arthritis   . Overactive bladder   . Pleural  effusion, left 11/11/2011    Estella Husk, Pharm.D., BCPS Clinical Pharmacist  Pager [phone] 11/29/2011, 9:29 AM

## 2011-11-29 NOTE — Procedures (Signed)
Extubation Procedure Note  Patient Details:   Name: Michaela [name] DOB: 06/24/1956 MRN: [id number]   Airway Documentation:  Airway 37 mm (Active)    Evaluation  O2 sats: stable throughout and currently acceptable Complications: No apparent complications Patient did tolerate procedure well. Bilateral Breath Sounds: Rhonchi Suctioning: Airway  Pt extubated at this time per dr Tyson Alias pt tolerating well on 4L [place] no stridor no distress noted Sharyne Richters 11/29/2011, 9:11 AM

## 2011-11-30 ENCOUNTER — Inpatient Hospital Stay (HOSPITAL_COMMUNITY): Payer: Medicare Other

## 2011-11-30 DIAGNOSIS — I4891 Unspecified atrial fibrillation: Secondary | ICD-10-CM

## 2011-11-30 DIAGNOSIS — A0472 Enterocolitis due to Clostridium difficile, not specified as recurrent: Secondary | ICD-10-CM

## 2011-11-30 DIAGNOSIS — J984 Other disorders of lung: Secondary | ICD-10-CM

## 2011-11-30 DIAGNOSIS — I959 Hypotension, unspecified: Secondary | ICD-10-CM

## 2011-11-30 LAB — BASIC METABOLIC PANEL
CO2: 34 mEq/L — ABNORMAL HIGH (ref 19–32)
Calcium: 8.3 mg/dL — ABNORMAL LOW (ref 8.4–10.5)
Calcium: 7.9 mg/dL — ABNORMAL LOW (ref 8.4–10.5)
Chloride: 95 mEq/L — ABNORMAL LOW (ref 96–112)
Creatinine, Ser: 0.37 mg/dL — ABNORMAL LOW (ref 0.50–1.10)
GFR calc Af Amer: >90 mL/min (ref >90)
GFR calc non Af Amer: >90 mL/min (ref >90)
Glucose, Bld: 92 mg/dL (ref 70–99)
Potassium: 3.6 mEq/L (ref 3.5–5.1)
Sodium: 134 mEq/L — ABNORMAL LOW (ref 135–145)

## 2011-11-30 LAB — DIFFERENTIAL
Basophils Absolute: 0.3 10*3/uL — ABNORMAL HIGH (ref 0.0–0.1)
Eosinophils Absolute: 0.3 10*3/uL (ref 0.0–0.7)
Lymphocytes Relative: 6 % — ABNORMAL LOW (ref 12–46)
Monocytes Relative: 5 % (ref 3–12)
Neutrophils Relative %: 87 % — ABNORMAL HIGH (ref 43–77)

## 2011-11-30 LAB — MAGNESIUM
Magnesium: 2.2 mg/dL (ref 1.5–2.5)
Magnesium: 2.1 mg/dL (ref 1.5–2.5)

## 2011-11-30 LAB — PHOSPHORUS
Phosphorus: 2.7 mg/dL (ref 2.3–4.6)
Phosphorus: 2.9 mg/dL (ref 2.3–4.6)

## 2011-11-30 LAB — PROTIME-INR
INR: 1.55 — ABNORMAL HIGH (ref 0.00–1.49)
Prothrombin Time: 18.9 seconds — ABNORMAL HIGH (ref 11.6–15.2)

## 2011-11-30 LAB — CBC
HCT: 24.3 % — ABNORMAL LOW (ref 36.0–46.0)
Platelets: 63 10*3/uL — ABNORMAL LOW (ref 150–400)
RDW: 19.2 % — ABNORMAL HIGH (ref 11.5–15.5)
WBC: 32.3 10*3/uL — ABNORMAL HIGH (ref 4.0–10.5)

## 2011-11-30 LAB — GLUCOSE, CAPILLARY
Glucose-Capillary: 74 mg/dL (ref 70–99)
Glucose-Capillary: 67 mg/dL — ABNORMAL LOW (ref 70–99)
Glucose-Capillary: 134 mg/dL — ABNORMAL HIGH (ref 70–99)

## 2011-11-30 LAB — HEPARIN LEVEL (UNFRACTIONATED): Heparin Unfractionated: 0.55 IU/mL (ref 0.30–0.70)

## 2011-11-30 MED ORDER — SODIUM CHLORIDE 0.9 % IV BOLUS (SEPSIS): 500.0000 mL | Freq: Once | INTRAVENOUS | Status: DC

## 2011-11-30 MED ORDER — DEXTROSE 50 % IV SOLN
INTRAVENOUS | Status: AC
  Administered 2011-11-30: 25 mL
  Filled 2011-11-30: qty 50

## 2011-11-30 NOTE — Progress Notes (Signed)
Michaela Bottoms, MD, [facility] ABIM Board Certified in Adult Cardiovascular Medicine,Internal Medicine and Critical Care Medicine    SUBJECTIVE  S/p SIRS. Cdiff. No nausea or vomiting. No SCP. No orthopnea or PND.   Principal Problem:  *SIRS (systemic inflammatory response syndrome) Active Problems:  Mitral regurgitation due to cusp prolapse  S/P mitral valve repair  Brain tumor, astrocytoma  Neuromuscular disorder  Diastolic CHF, chronic  Diarrhea  Hypotension  Encephalopathy acute  Hyponatremia  Dehydration  Leukocytosis  Chest pain  Anemia  Atrial fibrillation with RVR  C. difficile colitis  Septic shock  Acute respiratory failure   Past Medical History  Diagnosis Date  . 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 MVP (mitral valve prolapse)   . Mitral regurgitation   . Shortness of breath   . Heart murmur   . Neuromuscular disorder     numbness in hands and feet  . Arthritis   . Overactive bladder   . Pleural effusion, left 11/11/2011    Current Facility-Administered Medications  Medication Dose Route Frequency Provider Last Rate Last Dose  . 0.9 %  sodium chloride infusion   Intravenous Continuous Lonia Blood, MD 20 mL/hr at 11/26/11 2356    . acetaminophen (TYLENOL) suppository 650 mg  650 mg Rectal Q6H PRN Rodolph Bong, MD      . alum & mag hydroxide-simeth (MAALOX/MYLANTA) 200-200-20 MG/5ML suspension 30 mL  30 mL Oral Q6H PRN Rodolph Bong, MD      . amiodarone (NEXTERONE PREMIX) 360 mg/200 mL dextrose IV infusion  60 mg/hr Intravenous Continuous Ok Anis, NP 33.3 mL/hr at 11/30/11 1031 60 mg/hr at 11/30/11 1031  . dextrose 50 % solution        25 mL at 11/30/11 0740  . famotidine (PEPCID) tablet 20 mg  20 mg Oral Daily Severiano Gilbert, PHARMD   20 mg at 11/30/11 1031  . furosemide (LASIX) injection 40 mg  40 mg Intravenous Once Ilda Basset, MD   40 mg at 11/29/11 2252  . heparin ADULT infusion 100 units/mL  (25000 units/250 mL)  750 Units/hr Intravenous Continuous Severiano Gilbert, PHARMD 7.5 mL/hr at 11/30/11 0600 750 Units/hr at 11/30/11 0600  . levalbuterol (XOPENEX) nebulizer solution 0.63 mg  0.63 mg Nebulization Q6H PRN Rolan Lipa, NP   0.63 mg at 11/25/11 0149  . metroNIDAZOLE (FLAGYL) IVPB 500 mg  500 mg Intravenous Q6H Nelda Bucks, MD   500 mg at 11/30/11 1202  . ondansetron (ZOFRAN) injection 4 mg  4 mg Intravenous Q6H PRN Rodolph Bong, MD      . SAILS Study - rosuvastatin / placebo (PI-Wright)  20 mg Oral Daily Nelda Bucks, MD   20 mg at 11/30/11 1000  . sodium chloride 0.9 % injection 3 mL  3 mL Intravenous Q12H Rodolph Bong, MD   3 mL at 11/30/11 1031  . vancomycin (VANCOCIN) 50 mg/mL oral solution 500 mg  500 mg Oral Q6H Nelda Bucks, MD   500 mg at 11/30/11 1207  . DISCONTD: cefTRIAXone (ROCEPHIN) 1 g in dextrose 5 % 50 mL IVPB  1 g Intravenous Daily Nelda Bucks, MD   1 g at 11/29/11 1000  . DISCONTD: diltiazem (CARDIZEM) injection 10 mg  10 mg Intravenous Q1H PRN Tonny Bollman, MD   10 mg at 11/28/11 1419  . DISCONTD: insulin aspart (novoLOG) injection 0-9 Units  0-9 Units Subcutaneous Q4H Mcarthur Rossetti  Tyson Alias, MD   1 Units at 11/28/11 1948  . DISCONTD: sodium chloride 0.9 % bolus 500 mL  500 mL Intravenous Once Ilda Basset, MD        LABS: Basic Metabolic Panel:  Basename 11/30/11 0420 11/29/11 2320  NA 134* 133*  K 3.6 4.0  CL 96 95*  CO2 32 34*  GLUCOSE 80 92  BUN 7 7  CREATININE 0.38* 0.37*  CALCIUM 7.9* 8.3*  MG 2.1 2.2  PHOS 2.9 2.7   Liver Function Tests:  Basename 11/29/11 0424  AST 32  ALT 12  ALKPHOS 97  BILITOT 0.3  PROT 4.2*  ALBUMIN 1.6*   No results found for this basename: LIPASE:2,AMYLASE:2 in the last 72 hours CBC:  Basename 11/30/11 0420 11/29/11 0424  WBC 32.3* 35.8*  NEUTROABS 28.2* 31.4*  HGB 7.7* 8.2*  HCT 24.3* 25.2*  MCV 85.9 83.4  PLT 63* 73*   Cardiac Enzymes:  Basename  11/29/11 0424  CKTOTAL 23  CKMB --  CKMBINDEX --  TROPONINI --    PHYSICAL EXAM  Filed Vitals:   11/30/11 0700 11/30/11 0800 11/30/11 0900 11/30/11 1000  BP: 89/60 95/54 101/57 105/71  Pulse: 90 76 92 95  Temp:  98 F (36.7 C)    TempSrc:  Oral    Resp: 14 10 13 12   Height:      Weight:      SpO2: 99% 100% 97% 95%   BP 105/71  Pulse 95  Temp(Src) 98 F (36.7 C) (Oral)  Resp 12  Ht 4\' 11"  (1.499 m)  Wt 109 lb 2 oz (49.5 kg)  BMI 22.04 kg/m2  SpO2 95%  Intake/Output Summary (Last 24 hours) at 11/30/11 1303 Last data filed at 11/30/11 1202  Gross per 24 hour  Intake 1706.6 ml  Output   1925 ml  Net -218.4 ml   I/O last 3 completed shifts: In: 2801.2 [I.V.:1831.2; Other:50; NG/GT:260; IV Piggyback:660] Out: 5240 [Urine:5240]  Not examined.Patient in no distress.   TELEMETRY: Reviewed telemetry pt in NSR  ASSESSMENT AND PLAN:  1. Diarrhea   2. Weight loss   3. Anorexia   4. Mitral regurgitation due to cusp prolapse  S/p Alfieri procedure EF =55%  5. Atrial fibrillation with RVR - in NSR on amiodarone  6. Acute respiratory failure   7. Anemia Hb 7-7  8. Brain tumor, astrocytoma   9. C. difficile colitis   10. Encephalopathy acute   11. Leukocytosis   12. Septic shock   13. SIRS (systemic inflammatory response syndrome)   14. Pneumonia     PLAN   Will hold lasix today. Low BP.   Edema is secondary to hypoalbuminemia  Continue amiodarone.  Monitor transfusion needs         Michaela [name] 11/30/2011, 1:03 PM

## 2011-11-30 NOTE — Progress Notes (Signed)
eLink Physician-Brief Progress Note Patient Name: Michaela [name] DOB: Mar 09, 1956 MRN: [id number]  Date of Service  11/30/2011   HPI/Events of Note  Persistent a-fib, lytes WNL  eICU Interventions  NS bolus 500 cc.  If no response.  Will give lopressor 5 IV.     Intervention Category Major Interventions: Arrhythmia - evaluation and management  Bertel Venard 11/30/2011, 12:08 AM

## 2011-11-30 NOTE — Progress Notes (Signed)
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  She was admitted 3/14 by [facility] with CDiff colitis and hypotension, PCCM was consulted. Hypotension initially improved with fluid resuscitation and PCCM signed off.  On 3/14 developed worsening hypotension, obtundation and resp failure and PCCM called back to assume ICU care.   Lines / Drains: ETT 3/18>>> 3/22 Left IJ 3/18>>> Rt fem line 3/18>>> 3/22  Cultures: Urine 3/14>>> neg BCx2 3/14>>>neg CDiff 3/14>>> POS Stool 3/14>>>neg BCx2 3/18>>> neg Urine 3/18>>> neg Sputum 3/19>>> neg  Antibiotics: Vanc (enema-Cdiff) 3/18>>> 3/22 Ceftaz (?asp PNA) 3/18>>>3/21 Ceftriaxone 3/21>> 3/23  Flagyl (CDiff) 3/14>>> Vanc (oral - CDiff) 3/14>>>  Tests / Events: 3/14 resp failure, hypotension, intubated 3.18 CT abdo/pelvis -Prominent wall thickening throughout most of the colon, which certainly could be a secondary finding from C. difficile colitis. Bilateral small pleural effusions with bibasilar consolidation. Extensive subcutaneous and mesenteric edema, with ascites. . 3/18 No acute intracranial findings. 3/18 - ARDS 3/19 -off pressors 3/20 afib rvr, neg 4 liters 3/21- neg 1 lit, improved vent 3/20 echo - 55%, mitral appears good 3/21- neg 3 liters  Vital Signs: Temp:  [97.6 F (36.4 C)-98.1 F (36.7 C)] 98 F (36.7 C) (03/23 0800) Pulse Rate:  [43-96] 95  (03/23 1000) Resp:  [10-23] 12  (03/23 1000) BP: (82-105)/(43-71) 105/71 mmHg (03/23 1000) SpO2:  [93 %-100 %] 95 % (03/23 1000) Weight:  [49.5 kg (109 lb 2 oz)] 49.5 kg (109 lb 2 oz) (03/23 0436) I/O last 3 completed shifts: In: 2801.2 [I.V.:1831.2; Other:50; NG/GT:260; IV Piggyback:660] Out: 5240 [Urine:5240]  Physical Examination:  General: chronically ill  appearing, NAD HEENT: mm dry, line clean Neuro: follows commands, rass 0, course tremor (chronic) Lungs: slight coarse Cards: RRR s M Abd: soft,  NABS Ext: warm and dry, no edema   Labs and Imaging:   CBC    Component Value Date/Time   WBC 32.3* 11/30/2011 0420   RBC 2.83* 11/30/2011 0420   HGB 7.7* 11/30/2011 0420   HCT 24.3* 11/30/2011 0420   PLT 63* 11/30/2011 0420   MCV 85.9 11/30/2011 0420   MCH 27.2 11/30/2011 0420   MCHC 31.7 11/30/2011 0420   RDW 19.2* 11/30/2011 0420   LYMPHSABS 1.9 11/30/2011 0420   MONOABS 1.6* 11/30/2011 0420   EOSABS 0.3 11/30/2011 0420   BASOSABS 0.3* 11/30/2011 0420    BMET    Component Value Date/Time   NA 134* 11/30/2011 0420   K 3.6 11/30/2011 0420   CL 96 11/30/2011 0420   CO2 32 11/30/2011 0420   GLUCOSE 80 11/30/2011 0420   BUN 7 11/30/2011 0420   CREATININE 0.38* 11/30/2011 0420   CREATININE 0.55 11/11/2011 1414   CALCIUM 7.9* 11/30/2011 0420   GFRNONAA >90 11/30/2011 0420   GFRAA >90 11/30/2011 0420     Lab 11/30/11 0420  INR 1.55*    Dg Chest Port 1 View  11/30/2011  *RADIOLOGY REPORT*  Clinical Data: Evaluate pulmonary edema  PORTABLE CHEST - 1 VIEW  Comparison: 11/29/2011; 11/28/2011; 11/27/2011  Findings:  Grossly unchanged enlarged cardiac silhouette and mediastinal contours.  Interval extubation and removal of enteric tube. Unchanged positioning of a left jugular approach central venous catheter with tip over the superior aspect of the right atrium. Minimal improved aeration of the right  upper and mid lung.  Grossly unchanged bilateral perihilar and basilar heterogeneous opacities. Grossly unchanged small bilateral effusions.  No definite pneumothorax.  Grossly unchanged bones.  IMPRESSION: 1.  Interval extubation and removal of enteric tube.  No supine evidence of pneumothorax. 2.  Minimal improved aeration of the right upper and mid lung with persistent findings of pulmonary edema and bilateral heterogeneous perihilar and basilar opacities,  atelectasis versus infiltrate. 3.  Unchanged small bilateral effusions.  Original Report Authenticated By: Waynard Reeds, M.D.   Dg Chest Port 1 View  11/29/2011  *RADIOLOGY REPORT*  Clinical Data: Assess ARDS, infiltrates and edema  PORTABLE CHEST - 1 VIEW  Comparison: 11/28/2011  Findings: The endotracheal tube tip is located 3.3 cm above the level of the carina.  A left internal jugular CVP is in place with the tip located in the right atrium.  This can be pulled back as much as 5 cm to ensure positioning in the region of the superior cavoatrial junction. The nasogastric tip is located below the level of the diaphragm and not visualized on this film.  Persistent bilateral alveolar infiltrates are seen predominately involving the left lung base and more diffusely throughout the right hemithorax. Previously noted small left pleural effusion is less obvious today.  Some improved aeration in the left midlung zone is noted in comparison with the previous exam. Pulmonary vascular congestion with no signs of overt congestive failure are seen.  Some superimposed interstitial edema may be present.  IMPRESSION: Some improvement in aeration in the left midlung zone.  Otherwise unchanged cardiopulmonary appearance.  Original Report Authenticated By: Bertha Stakes, M.D.    Assessment and Plan:  Acute Respiratory failure - large edema component, abd distension and likely aspiration PNA, ARDS. Tolerating extubation well.  - Cont supportive mgmt - Transfer to SDU   CDiff colitis -- improved Continue oral vanc, flagyl, has responded to this If clinically cdiff worsen change to fodaximicin  Aspiration PNA--  PLAN -  Clinically improved. CXR lagging. Cont to monitor clinically and radiographically  Hypotension/ septic shock - resolved  Encephalopathy -- resolved  Coagulopathy - in setting coumadin for Afib / sepsis Hep per pharmacy Consider coumadin start soon  Afib rvr - Cards following.  PAF 3/22  PM. NSR now - Amiodarone gtt (monitor resp function and CXR given resolving ARDS) - Hep gtt   Chronic diastolic CHF/ mitral regurg - Cards following.  EF55-60% on echo 10/2011 - NO further diuresis planned  Hyponatremia -   Anemia --  No evidence of acute blood loss. No indication for PRBCs  Malnourished, severe Diet ordered  Transfer to SDU 3/22   Billy Fischer, MD 11/30/2011  3:16 PM

## 2011-11-30 NOTE — Progress Notes (Signed)
ANTICOAGULATION CONSULT NOTE - Initial Consult  Pharmacy Consult for heparin Indication: atrial fibrillation  Assessment: 56 year old female with severe mitral regurgitation s/p MVR in February. Patient presented with coagulapathy on warfarin as outpatient for afib, this has since been corrected. Patient persistently in atrial fibrillation o/n but NSR now. Full dose Heparin was started 3/21.  Heparin level therapeutic this AM.  Goal of Therapy:  Heparin level 0.3-0.7 units/ml   Plan:  1. Continue heparin at 750 units/hr, heparin level at goal today and plt wnl. 2. Hgb dropping, now below 8, plts ~60, and having soft BPs on heparin. Would consider holding heparin. 3. F/U daily HL and CBC 4. F/U restart of Coumadin  Zoe Lan, PharmD pgr [phone] 11/30/2011, 9:44 AM    Allergies  Allergen Reactions  . Compazine Other (See Comments)    "eyes get buggy"    Patient Measurements: Height: 4\' 11"  (149.9 cm) Weight: 109 lb 2 oz (49.5 kg) IBW/kg (Calculated) : 43.2  Heparin Dosing Weight: 50kg  Vital Signs: Temp: 98 F (36.7 C) (03/23 0800) Temp src: Oral (03/23 0800) BP: 101/57 mmHg (03/23 0900) Pulse Rate: 92  (03/23 0900)  Labs:  Basename 11/30/11 0420 11/29/11 2320 11/29/11 0424 11/29/11 0018 11/28/11 0400  HGB 7.7* -- 8.2* -- --  HCT 24.3* -- 25.2* -- 24.2*  PLT 63* -- 73* -- 88*  APTT -- -- -- -- 33  LABPROT 18.9* -- 19.0* -- 18.6*  INR 1.55* -- 1.56* -- 1.52*  HEPARINUNFRC 0.55 -- 0.49 0.41 --  CREATININE 0.38* 0.37* 0.37* -- --  CKTOTAL -- -- 23 -- --  CKMB -- -- -- -- --  TROPONINI -- -- -- -- --   Estimated Creatinine Clearance: 55.5 ml/min (by C-G formula based on Cr of 0.38).  Medical History: Past Medical History  Diagnosis Date  . 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 MVP (mitral valve prolapse)   . Mitral regurgitation   . Shortness of breath   . Heart murmur   . Neuromuscular disorder     numbness in hands and feet  .  Arthritis   . Overactive bladder   . Pleural effusion, left 11/11/2011

## 2011-11-30 NOTE — Progress Notes (Signed)
[facility] BRIEF PROGRESS NOTE   [name] looks remarkably good.  Will continue to follow.  Michaela [name] 11/30/2011 4:29 PM

## 2011-12-01 ENCOUNTER — Inpatient Hospital Stay (HOSPITAL_COMMUNITY): Payer: Medicare Other

## 2011-12-01 LAB — CBC
HCT: 26.2 % — ABNORMAL LOW (ref 36.0–46.0)
MCHC: 32.1 g/dL (ref 30.0–36.0)
MCV: 85.3 fL (ref 78.0–100.0)
Platelets: 68 10*3/uL — ABNORMAL LOW (ref 150–400)
RDW: 20.8 % — ABNORMAL HIGH (ref 11.5–15.5)
WBC: 27.8 10*3/uL — ABNORMAL HIGH (ref 4.0–10.5)

## 2011-12-01 LAB — BASIC METABOLIC PANEL
BUN: 10 mg/dL (ref 6–23)
Chloride: 98 mEq/L (ref 96–112)
GFR calc Af Amer: >90 mL/min (ref >90)
GFR calc non Af Amer: >90 mL/min (ref >90)
Glucose, Bld: 92 mg/dL (ref 70–99)
Potassium: 3.5 mEq/L (ref 3.5–5.1)
Sodium: 134 mEq/L — ABNORMAL LOW (ref 135–145)

## 2011-12-01 LAB — PROTIME-INR: INR: 1.35 (ref 0.00–1.49)

## 2011-12-01 LAB — CULTURE, BLOOD (ROUTINE X 2): Culture: NO GROWTH

## 2011-12-01 LAB — PROCALCITONIN: Procalcitonin: 0.2 ng/mL

## 2011-12-01 MED ORDER — DILTIAZEM HCL 100 MG IV SOLR
5.0000 mg/h | INTRAVENOUS | Status: DC
  Filled 2011-12-01: qty 100

## 2011-12-01 MED ORDER — METRONIDAZOLE 500 MG PO TABS
500.0000 mg | ORAL_TABLET | Freq: Four times a day (QID) | ORAL | Status: DC
  Administered 2011-12-01 – 2011-12-04 (×14): 500 mg via ORAL
  Filled 2011-12-01 (×16): qty 1

## 2011-12-01 MED ORDER — AMIODARONE HCL 200 MG PO TABS
200.0000 mg | ORAL_TABLET | Freq: Two times a day (BID) | ORAL | Status: DC
  Administered 2011-12-01 – 2011-12-04 (×7): 200 mg via ORAL
  Filled 2011-12-01 (×8): qty 1

## 2011-12-01 NOTE — Progress Notes (Signed)
Name: Michaela [name] MRN: [id number] DOB: [birth year]    LOS: 10  [facility]  History of Present Illness: 56 yo female SND resident with hx cerebral astrocytoma at age 71 s/p resection, severe mitral regurgitation s/p MVR in February with course c/b UTI.  She was admitted 3/14 by [facility] with CDiff colitis and hypotension, PCCM was consulted. Hypotension initially improved with fluid resuscitation and PCCM signed off.  On 3/14 developed worsening hypotension, obtundation and resp failure and PCCM called back to assume ICU care.   Lines / Drains: ETT 3/18>>> 3/22 Left IJ 3/18>>> Rt fem line 3/18>>> 3/22  Cultures: Urine 3/14>>> neg BCx2 3/14>>>neg CDiff 3/14>>> POS Stool 3/14>>>neg BCx2 3/18>>> neg Urine 3/18>>> neg Sputum 3/19>>> neg  Antibiotics: Vanc (enema-Cdiff) 3/18>>> 3/22 Ceftaz (?asp PNA) 3/18>>>3/21 Ceftriaxone 3/21>> 3/23  Flagyl (CDiff) 3/14>>> Vanc (oral - CDiff) 3/14>>>  Tests / Events: 3/14 resp failure, hypotension, intubated 3.18 CT abdo/pelvis -Prominent wall thickening throughout most of the colon, which certainly could be a secondary finding from C. difficile colitis. Bilateral small pleural effusions with bibasilar consolidation. Extensive subcutaneous and mesenteric edema, with ascites. . 3/18 No acute intracranial findings. 3/18 - ARDS 3/19 -off pressors 3/20 afib rvr, neg 4 liters 3/21- neg 1 lit, improved vent 3/20 echo - 55%, mitral appears good 3/21- neg 3 liters SUBJ: Lethargic but no new complaints. + F/C. Very weak  Vital Signs: Temp:  [98.2 F (36.8 C)-98.9 F (37.2 C)] 98.2 F (36.8 C) (03/24 0812) Pulse Rate:  [82-100] 82  (03/24 0839) Resp:  [9-18] 11  (03/24 0839) BP: (87-114)/(50-72) 102/60 mmHg (03/24 0500) SpO2:  [91 %-98 %] 98 % (03/24 0839) Weight:  [49.1 kg (108 lb 3.9 oz)] 49.1 kg (108 lb 3.9 oz) (03/24 0458) I/O last 3 completed shifts: In: 2041.1 [P.O.:360; I.V.:1481.1; IV Piggyback:200] Out: 2200  [Urine:2200]  Physical Examination:  General: chronically ill appearing, NAD HEENT: mm dry, line clean Neuro: follows commands, rass 0, course tremor (chronic) Lungs: slight coarse Cards: RRR s M Abd: soft,  NABS Ext: warm and dry, no edema   Labs and Imaging:   CBC    Component Value Date/Time   WBC 27.8* 12/01/2011 0515   RBC 3.07* 12/01/2011 0515   HGB 8.4* 12/01/2011 0515   HCT 26.2* 12/01/2011 0515   PLT 68* 12/01/2011 0515   MCV 85.3 12/01/2011 0515   MCH 27.4 12/01/2011 0515   MCHC 32.1 12/01/2011 0515   RDW 20.8* 12/01/2011 0515   LYMPHSABS 1.9 11/30/2011 0420   MONOABS 1.6* 11/30/2011 0420   EOSABS 0.3 11/30/2011 0420   BASOSABS 0.3* 11/30/2011 0420    BMET    Component Value Date/Time   NA 134* 12/01/2011 0515   K 3.5 12/01/2011 0515   CL 98 12/01/2011 0515   CO2 32 12/01/2011 0515   GLUCOSE 92 12/01/2011 0515   BUN 10 12/01/2011 0515   CREATININE 0.38* 12/01/2011 0515   CREATININE 0.55 11/11/2011 1414   CALCIUM 8.0* 12/01/2011 0515   GFRNONAA >90 12/01/2011 0515   GFRAA >90 12/01/2011 0515     Lab 12/01/11 0515  INR 1.35    Dg Chest Port 1 View  12/01/2011  *RADIOLOGY REPORT*  Clinical Data: Respiratory failure.  PORTABLE CHEST - 1 VIEW  Comparison: 11/30/2011.  Findings: Post valve replacement.  Left central line tip right atrium level.  To be within the distal superior vena cava, this can be retracted by 4 cm.  Ventriculoperitoneal shunt courses over the right lung.  Asymmetric  air space disease greater on the right.  Left-sided pleural effusion.  Findings without significant change.  IMPRESSION: No significant change in diffuse asymmetric air space disease. Please see above.  Original Report Authenticated By: Fuller Canada, M.D.   Dg Chest Port 1 View  11/30/2011  *RADIOLOGY REPORT*  Clinical Data: Evaluate pulmonary edema  PORTABLE CHEST - 1 VIEW  Comparison: 11/29/2011; 11/28/2011; 11/27/2011  Findings:  Grossly unchanged enlarged cardiac silhouette and mediastinal  contours.  Interval extubation and removal of enteric tube. Unchanged positioning of a left jugular approach central venous catheter with tip over the superior aspect of the right atrium. Minimal improved aeration of the right upper and mid lung.  Grossly unchanged bilateral perihilar and basilar heterogeneous opacities. Grossly unchanged small bilateral effusions.  No definite pneumothorax.  Grossly unchanged bones.  IMPRESSION: 1.  Interval extubation and removal of enteric tube.  No supine evidence of pneumothorax. 2.  Minimal improved aeration of the right upper and mid lung with persistent findings of pulmonary edema and bilateral heterogeneous perihilar and basilar opacities, atelectasis versus infiltrate. 3.  Unchanged small bilateral effusions.  Original Report Authenticated By: Waynard Reeds, M.D.    Assessment and Plan:  Acute Respiratory failure - large edema component, abd distension and likely aspiration PNA, ARDS. Tolerating extubation well.  - Cont supportive mgmt - Transfer to SDU   CDiff colitis -- improved Continue oral vanc, flagyl, has responded to this If clinically cdiff worsen change to fodaximicin  Aspiration PNA--  PLAN -  Clinically improved. CXR lagging. Cont to monitor clinically and radiographically  Hypotension/ septic shock - resolved  Encephalopathy -- resolved  Coagulopathy - in setting coumadin for Afib / sepsis Hep per pharmacy Consider coumadin start soon  Afib rvr - Cards following.  PAF 3/22 PM. NSR now - Amiodarone and Hep gtt per Cards   Chronic diastolic CHF/ mitral regurg - Cards following.  EF55-60% on echo 10/2011 - No further diuresis planned  Hyponatremia - resolved  Anemia --  No evidence of acute blood loss. No indication for PRBCs  Malnourished, severe Diet ordered  Debilitation -Acute on chronic. Presently moderate to severe. PT ordered. Suspect will need Rehab of some sort after discharge To remain in SDU 3/24   Billy Fischer, MD 12/01/2011  11:51 AM

## 2011-12-01 NOTE — Progress Notes (Signed)
Report from Night RN. Chart reviewed together. Handoff complete.  

## 2011-12-01 NOTE — Progress Notes (Addendum)
ANTICOAGULATION CONSULT NOTE - Initial Consult ANTIBIOTIC CONSULT NOTE - Follow up Consult  Pharmacy Consult for heparin Indication: atrial fibrillation  Pharmacy Consult for Vancomycin PO Indication: C Diff  Assessment: 56 year old female with severe mitral regurgitation s/p MVR in February. Patient presented with coagulapathy on warfarin as outpatient for afib, this has since been corrected. Patient in NSR now. Full dose Heparin was started 3/21.  Heparin level therapeutic this AM.  D#11 of 14 - PO vancomycin and IV metronidazole for severe Cdiff infection. Pt has been afebrile >24h, WBC trending down, showing clinical improvement.  Goal of Therapy:  Heparin level 0.3-0.7 units/ml Resolution of CDiff infection   Plan:  1. Continue heparin at 750 units/hr, heparin level at goal today and plt wnl. 2. F/U daily HL and CBC 3. F/U restart of Coumadin 4. Continue Vancomycin 125 mg PO q6 hrs for severe episode of C.diff with Metronidazole. Will change IV to PO metronidazole considering clinical improvement, per [employer] pharmacy policy. End dates for vanc/flagyl placed.  Zoe Lan, PharmD pgr [phone] 12/01/2011, 9:17 AM    Allergies  Allergen Reactions  . Compazine Other (See Comments)    "eyes get buggy"    Patient Measurements: Height: 4\' 11"  (149.9 cm) Weight: 108 lb 3.9 oz (49.1 kg) IBW/kg (Calculated) : 43.2  Heparin Dosing Weight: 50kg  Vital Signs: Temp: 98.2 F (36.8 C) (03/24 0812) Temp src: Oral (03/24 0812) BP: 102/60 mmHg (03/24 0500) Pulse Rate: 82  (03/24 0839)  Labs:  Basename 12/01/11 0515 11/30/11 0420 11/29/11 2320 11/29/11 0424  HGB 8.4* 7.7* -- --  HCT 26.2* 24.3* -- 25.2*  PLT 68* 63* -- 73*  APTT -- -- -- --  LABPROT 16.9* 18.9* -- 19.0*  INR 1.35 1.55* -- 1.56*  HEPARINUNFRC 0.44 0.55 -- 0.49  CREATININE 0.38* 0.38* 0.37* --  CKTOTAL -- -- -- 23  CKMB -- -- -- --  TROPONINI -- -- -- --   Estimated Creatinine Clearance: 55.5 ml/min (by C-G  formula based on Cr of 0.38).  Medical History: Past Medical History  Diagnosis Date  . 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 MVP (mitral valve prolapse)   . Mitral regurgitation   . Shortness of breath   . Heart murmur   . Neuromuscular disorder     numbness in hands and feet  . Arthritis   . Overactive bladder   . Pleural effusion, left 11/11/2011

## 2011-12-01 NOTE — Progress Notes (Signed)
Peyton Bottoms, MD, [facility] Board Certified in Adult Cardiovascular Medicine,Internal Medicine and Critical Care Medicine    SUBJECTIVE  S/p SIRS. Cdiff. No nausea or vomiting. No SCP. No orthopnea or PND. No complaints.   Principal Problem:  *SIRS (systemic inflammatory response syndrome) Active Problems:  Mitral regurgitation due to cusp prolapse  S/P mitral valve repair  Brain tumor, astrocytoma  Neuromuscular disorder  Diastolic CHF, chronic  Diarrhea  Hypotension  Encephalopathy acute  Hyponatremia  Dehydration  Leukocytosis  Chest pain  Anemia  Atrial fibrillation with RVR  C. difficile colitis  Septic shock  Acute respiratory failure   Past Medical History  Diagnosis Date  . 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 MVP (mitral valve prolapse)   . Mitral regurgitation   . Shortness of breath   . Heart murmur   . Neuromuscular disorder     numbness in hands and feet  . Arthritis   . Overactive bladder   . Pleural effusion, left 11/11/2011    Current Facility-Administered Medications  Medication Dose Route Frequency Provider Last Rate Last Dose  . 0.9 %  sodium chloride infusion   Intravenous Continuous Lonia Blood, MD 20 mL/hr at 11/26/11 2356    . acetaminophen (TYLENOL) suppository 650 mg  650 mg Rectal Q6H PRN Rodolph Bong, MD      . alum & mag hydroxide-simeth (MAALOX/MYLANTA) 200-200-20 MG/5ML suspension 30 mL  30 mL Oral Q6H PRN Rodolph Bong, MD      . amiodarone (NEXTERONE PREMIX) 360 mg/200 mL dextrose IV infusion  60 mg/hr Intravenous Continuous Ok Anis, NP 16.7 mL/hr at 12/01/11 0904 30 mg/hr at 12/01/11 0904  . famotidine (PEPCID) tablet 20 mg  20 mg Oral Daily Severiano Gilbert, PHARMD   20 mg at 12/01/11 1033  . heparin ADULT infusion 100 units/mL (25000 units/250 mL)  750 Units/hr Intravenous Continuous Severiano Gilbert, PHARMD 7.5 mL/hr at 12/01/11 0245 750 Units/hr at 12/01/11 0245  .  levalbuterol (XOPENEX) nebulizer solution 0.63 mg  0.63 mg Nebulization Q6H PRN Rolan Lipa, NP   0.63 mg at 11/25/11 0149  . metroNIDAZOLE (FLAGYL) tablet 500 mg  500 mg Oral Q6H Wyline Copas, PHARMD      . ondansetron [facility]) injection 4 mg  4 mg Intravenous Q6H PRN Rodolph Bong, MD      . SAILS Study - rosuvastatin / placebo ([facility])  20 mg Oral Daily Nelda Bucks, MD   20 mg at 12/01/11 1000  . sodium chloride 0.9 % injection 3 mL  3 mL Intravenous Q12H Rodolph Bong, MD   3 mL at 12/01/11 1033  . vancomycin (VANCOCIN) 50 mg/mL oral solution 500 mg  500 mg Oral Q6H Nelda Bucks, MD   500 mg at 12/01/11 0500  . DISCONTD: metroNIDAZOLE (FLAGYL) IVPB 500 mg  500 mg Intravenous Q6H Nelda Bucks, MD   500 mg at 12/01/11 0501    LABS: Basic Metabolic Panel:  Basename 12/01/11 0515 11/30/11 0420 11/29/11 2320  NA 134* 134* --  K 3.5 3.6 --  CL 98 96 --  CO2 32 32 --  GLUCOSE 92 80 --  BUN 10 7 --  CREATININE 0.38* 0.38* --  CALCIUM 8.0* 7.9* --  MG -- 2.1 2.2  PHOS -- 2.9 2.7   Liver Function Tests:  Basename 11/29/11 0424  AST 32  ALT 12  ALKPHOS 97  BILITOT 0.3  PROT 4.2*  ALBUMIN 1.6*   No results found for this basename: LIPASE:2,AMYLASE:2 in the last 72 hours CBC:  Basename 12/01/11 0515 11/30/11 0420 11/29/11 0424  WBC 27.8* 32.3* --  NEUTROABS -- 28.2* 31.4*  HGB 8.4* 7.7* --  HCT 26.2* 24.3* --  MCV 85.3 85.9 --  PLT 68* 63* --   Cardiac Enzymes:  Basename 11/29/11 0424  CKTOTAL 23  CKMB --  CKMBINDEX --  TROPONINI --    PHYSICAL EXAM  Filed Vitals:   12/01/11 0500 12/01/11 0812 12/01/11 0839 12/01/11 1209  BP: 102/60     Pulse: 89  82   Temp:  98.2 F (36.8 C)  98 F (36.7 C)  TempSrc:  Oral  Oral  Resp: 18  11   Height:      Weight:      SpO2: 92%  98%    BP 102/60  Pulse 82  Temp(Src) 98 F (36.7 C) (Oral)  Resp 11  Ht 4\' 11"  (1.499 m)  Wt 108 lb 3.9 oz (49.1 kg)  BMI 21.86 kg/m2  SpO2  98%  Intake/Output Summary (Last 24 hours) at 12/01/11 1211 Last data filed at 12/01/11 0501  Gross per 24 hour  Intake  759.7 ml  Output    750 ml  Net    9.7 ml   I/O last 3 completed shifts: In: 2041.1 [P.O.:360; I.V.:1481.1; IV Piggyback:200] Out: 2200 [Urine:2200]  Not examined.Patient in no distress.   TELEMETRY: Reviewed telemetry pt in NSR  ASSESSMENT AND PLAN:  1. Diarrhea- Cdiff   2. Weight loss   3. Anorexia   4. Mitral regurgitation due to cusp prolapse/severe MR/  S/p edge to edge repair  EF =55%  5. Atrial fibrillation with RVR - in NSR on amiodarone/anticoagulation  6. Acute respiratory failure   7. Anemia Hb 7-7  8. Brain tumor, astrocytoma   9. C. difficile colitis   10. Encephalopathy acute   11. Leukocytosis   12. Septic shock   13. SIRS (systemic inflammatory response syndrome) - resolved per CCM  14. Pneumonia     PLAN   Continue to hold lasix, ejection fraction normal  Edema is secondary to hypoalbuminemia  Continue amiodarone and heparin for now. Change amiodarone to PO 200mg  PO BID.   Monitor transfusion needs    Dr. Eden Emms to decide if patient needs full dose anticoagulation, probably DVT dose prophylaxis sufficient.        Alvin Critchley [facility] 12/01/2011, 12:11 PM

## 2011-12-02 LAB — BASIC METABOLIC PANEL
CO2: 30 mEq/L (ref 19–32)
Chloride: 99 mEq/L (ref 96–112)
Creatinine, Ser: 0.39 mg/dL — ABNORMAL LOW (ref 0.50–1.10)
Potassium: 3.5 mEq/L (ref 3.5–5.1)
Sodium: 133 mEq/L — ABNORMAL LOW (ref 135–145)

## 2011-12-02 LAB — CBC
Hemoglobin: 8.3 g/dL — ABNORMAL LOW (ref 12.0–15.0)
MCH: 27.8 pg (ref 26.0–34.0)
WBC: 23.5 10*3/uL — ABNORMAL HIGH (ref 4.0–10.5)

## 2011-12-02 LAB — HEPARIN LEVEL (UNFRACTIONATED): Heparin Unfractionated: 0.43 IU/mL (ref 0.30–0.70)

## 2011-12-02 LAB — AST: AST: 21 U/L (ref 0–37)

## 2011-12-02 LAB — C-REACTIVE PROTEIN: CRP: <0.02 mg/dL — ABNORMAL LOW (ref <0.60)

## 2011-12-02 LAB — CK: Total CK: 20 U/L (ref 7–177)

## 2011-12-02 MED ORDER — SODIUM CHLORIDE 0.9 % IJ SOLN
10.0000 mL | Freq: Two times a day (BID) | INTRAMUSCULAR | Status: DC
  Administered 2011-12-02 – 2011-12-04 (×4): 10 mL via INTRAVENOUS

## 2011-12-02 MED ORDER — FUROSEMIDE 10 MG/ML IJ SOLN
40.0000 mg | Freq: Three times a day (TID) | INTRAMUSCULAR | Status: AC
  Administered 2011-12-02: 40 mg via INTRAVENOUS
  Filled 2011-12-02: qty 4

## 2011-12-02 MED ORDER — FUROSEMIDE 10 MG/ML IJ SOLN
40.0000 mg | Freq: Three times a day (TID) | INTRAMUSCULAR | Status: DC
  Administered 2011-12-02: 40 mg via INTRAVENOUS
  Filled 2011-12-02 (×2): qty 4

## 2011-12-02 MED ORDER — WARFARIN - PHARMACIST DOSING INPATIENT: Freq: Every day | Status: DC

## 2011-12-02 MED ORDER — FUROSEMIDE 10 MG/ML IJ SOLN
20.0000 mg | Freq: Once | INTRAMUSCULAR | Status: DC
  Filled 2011-12-02: qty 2

## 2011-12-02 MED ORDER — POTASSIUM CHLORIDE CRYS ER 20 MEQ PO TBCR: 40.0000 meq | EXTENDED_RELEASE_TABLET | Freq: Once | ORAL | Status: DC

## 2011-12-02 MED ORDER — WARFARIN SODIUM 2 MG PO TABS
2.0000 mg | ORAL_TABLET | Freq: Once | ORAL | Status: AC
  Administered 2011-12-02: 2 mg via ORAL
  Filled 2011-12-02: qty 1

## 2011-12-02 MED ORDER — POTASSIUM CHLORIDE CRYS ER 20 MEQ PO TBCR
40.0000 meq | EXTENDED_RELEASE_TABLET | Freq: Three times a day (TID) | ORAL | Status: AC
  Administered 2011-12-02 (×2): 40 meq via ORAL
  Filled 2011-12-02 (×2): qty 2

## 2011-12-02 MED ORDER — POTASSIUM CHLORIDE 20 MEQ PO PACK: 40.0000 meq | PACK | Freq: Once | ORAL | Status: DC

## 2011-12-02 NOTE — Evaluation (Signed)
Clinical/Bedside Swallow Evaluation Patient Details  Name: Michaela [name] MRN: [id number] DOB: 05-23-1956 Today's Date: 12/02/2011  Past Medical History:  Past Medical History  Diagnosis Date  . 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 MVP (mitral valve prolapse)   . Mitral regurgitation   . Shortness of breath   . Heart murmur   . Neuromuscular disorder     numbness in hands and feet  . Arthritis   . Overactive bladder   . Pleural effusion, left 11/11/2011   Past Surgical History:  Past Surgical History  Procedure Date  . Ventriculoperitoneal shunt 1971  . Knee arthroscopy     right  . Cardiac catheterization     Jan 2013  . Transesophageal echocardiogram 7/12  . US echocardiography 6/12  . 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  She was admitted 3/14 by [facility] with CDiff colitis and hypotension. Pt with acute respiratory failure and was intubated 3/18 - 3/22.  MD initiated a dys 2 diet with thin liquids, and a bedside swallow eval was ordered to assess readiness for a diet upgrade.   Assessment/Recommendations/Treatment Plan SLP Assessment Clinical Impression Statement: Pt presents with s/s of aspiration following straw sips of thin liquid characterized by an immediate cough. No overt s/s of aspiration/penetration were observed across all other trials. Pt with tremor and decreased coordination at baseline, and prefers softer, chopped foods to facilitate feeding as well as oral prep/transit. Recommend a dys 3 (mechanical soft) diet with chopped meats and thin liquid via cup sips only.  Risk for Aspiration: Mild Other Related Risk Factors:  (pt reports h/o  GER)  Swallow Evaluation Recommendations Diet Recommendations: Dysphagia 3 (Mechanical Soft);Thin liquid (meats chopped) Liquid Administration via: Cup;No straw Medication Administration: Whole meds with puree Supervision: Patient able to self feed;Full supervision/cueing for compensatory strategies Compensations: Slow rate;Small sips/bites Postural Changes and/or Swallow Maneuvers: Seated upright 90 degrees Oral Care Recommendations: Oral care BID Follow up Recommendations: Skilled Nursing facility  Treatment Plan Treatment Plan Recommendations: Therapy as outlined in treatment plan below Speech Therapy Frequency: min 2x/week Treatment Duration: 2 weeks Interventions: Aspiration precaution training;Compensatory techniques;Patient/family education;Diet toleration management by SLP  Prognosis Prognosis for Safe Diet Advancement: Good  Individuals Consulted Consulted and Agree with Results and Recommendations: Patient;Family member/caregiver;RN Family Member Consulted: mother  Swallowing Goals  SLP Swallowing Goals Patient will consume recommended diet without observed clinical signs of aspiration with: Minimal assistance Swallow Study Goal #1 - Progress: Not Met Patient will utilize recommended strategies during swallow to increase swallowing safety with: Minimal assistance Swallow Study Goal #2 - Progress: Not met  Michaela [name] 12/02/2011,3:25 PM  Michaela [name], SLP Student

## 2011-12-02 NOTE — Progress Notes (Signed)
ANTICOAGULATION CONSULT NOTE - Initial Consult  Pharmacy Consult for heparin, coumadin Indication: atrial fibrillation  Assessment: 56 year old female with severe mitral regurgitation s/p MVR in February. Patient presented with coagulapathy on warfarin as outpatient for afib, this has since been corrected. Full dose Heparin was started 3/21 and current heparin level is at goal (0.39).  Coumadin to restart today (home dose 2.5mg /day) and plts at 55K, INR=1.35 (12/01/11), Hg=8.3 and patient on flagyl.  Goal of Therapy:  Heparin level 0.3-0.7 units/ml INR= 2.0-2.5   Plan:  -No heparin changes -Coumadin 2mg  today -Daily PT/INRs  Harland German, Pharm D 12/02/2011 9:12 AM    Allergies  Allergen Reactions  . Compazine Other (See Comments)    "eyes get buggy"    Patient Measurements: Height: 4\' 11"  (149.9 cm) Weight: 107 lb 9.4 oz (48.8 kg) IBW/kg (Calculated) : 43.2  Heparin Dosing Weight: 50kg  Vital Signs: Temp: 97.4 F (36.3 C) (03/25 0800) Temp src: Oral (03/25 0800) BP: 96/56 mmHg (03/25 0800) Pulse Rate: 89  (03/25 0800)  Labs:  Basename 12/02/11 0400 12/01/11 0515 11/30/11 0420  HGB 8.3* 8.4* --  HCT 25.9* 26.2* 24.3*  PLT 55* 68* 63*  APTT -- -- --  LABPROT -- 16.9* 18.9*  INR -- 1.35 1.55*  HEPARINUNFRC 0.43 0.44 0.55  CREATININE 0.39* 0.38* 0.38*  CKTOTAL 20 -- --  CKMB -- -- --  TROPONINI -- -- --   Estimated Creatinine Clearance: 54.8 ml/min (by C-G formula based on Cr of 0.39).  Medical History: Past Medical History  Diagnosis Date  . 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 MVP (mitral valve prolapse)   . Mitral regurgitation   . Shortness of breath   . Heart murmur   . Neuromuscular disorder     numbness in hands and feet  . Arthritis   . Overactive bladder   . Pleural effusion, left 11/11/2011

## 2011-12-02 NOTE — Progress Notes (Signed)
CSW reviewed chart and spoke with pt and pt mother at length. Pt in good spirits - CSW wished her Happy Birthday.  Pt mother very interested in LTAC. CSW discussed this scenario and plan to pursue SNF options as back up in the event LTAC is not possible.  Will continue to follow Michaela [name], MSW [phone]

## 2011-12-02 NOTE — Progress Notes (Signed)
Physical Therapy Note   12/02/11 0845  PT Visit Information  Last PT Received On 12/02/11  Precautions  Precautions Fall  Restrictions  Weight Bearing Restrictions No  Bed Mobility  Bed Mobility Yes  Supine to Sit 1: +2 Total assist;Patient percentage (comment) (pt 30%)  Supine to Sit Details (indicate cue type and reason) cues for sequencing, use of UEs  Sitting - Scoot to Edge of Bed 1: +2 Total assist  Sitting - Scoot to Edge of Bed Details (indicate cue type and reason) used pad under hips  Transfers  Transfers Yes  Stand Pivot Transfers 1: +2 Total assist;Patient percentage (comment) (pt 20%)  Stand Pivot Transfer Details (indicate cue type and reason) Cues for sequencing, safe technique, trunk/hip extension, movement of LEs through transfer  Ambulation/Gait  Ambulation/Gait No  Stairs No  Wheelchair Mobility  Wheelchair Mobility No  Posture/Postural Control  Posture/Postural Control Postural limitations  Postural Limitations pt kyphotic  Balance  Balance Assessed Yes  Static Sitting Balance  Static Sitting - Balance Support Bilateral upper extremity supported;Feet supported  Static Sitting - Level of Assistance 4: Min assist  Static Sitting - Comment/# of Minutes cues for trunk control.  pt leans posterior and R.  Needs guarding and occasional MinA to correct.    PT - End of Session  Equipment Utilized During Treatment Gait belt  Activity Tolerance Patient tolerated treatment well  Patient left in chair;with call bell in reach  Nurse Communication Mobility status for transfers  General  Behavior During Session [employer] for tasks performed  Cognition [facility] for tasks performed  PT - Assessment/Plan  Comments on Treatment Session pt now off Vent and feeding tube.  pt participating well, just limited by weakness and fatigue.    PT Plan Discharge plan remains appropriate;Frequency remains appropriate  PT Frequency Min 2X/week  Recommendations for Other Services OT consult    Follow Up Recommendations Skilled nursing facility  Equipment Recommended Defer to next venue  Acute Rehab PT Goals  PT Goal: Supine/Side to Sit - Progress Progressing toward goal  PT Transfer Goal: Bed to Chair/Chair to Bed - Progress Progressing toward goal  Additional Goals  PT Goal: Additional Goal #1 - Progress Progressing toward goal    Mack Hook, PT [phone]

## 2011-12-02 NOTE — Progress Notes (Addendum)
Patient ID: Michaela [name], female   DOB: 20-May-1956, 56 y.o.   MRN: [id number]    SUBJECTIVE: Very slight abdominal discomfort, much improved.  No dyspnea.  Has appetite back.      Marland Kitchen amiodarone  200 mg Oral BID  . famotidine  20 mg Oral Daily  . metroNIDAZOLE  500 mg Oral Q6H  . SAILS Study - rosuvastatin / placebo (PI-Wright)  20 mg Oral Daily  . sodium chloride  3 mL Intravenous Q12H  . vancomycin  500 mg Oral Q6H  . DISCONTD: metronidazole  500 mg Intravenous Q6H  heparin gtt    Filed Vitals:   12/02/11 0200 12/02/11 0400 12/02/11 0546 12/02/11 0600  BP: 104/71 102/64  108/66  Pulse: 90 88  90  Temp:  97.5 F (36.4 C)    TempSrc:  Oral    Resp: 15 12  18   Height:      Weight:   107 lb 9.4 oz (48.8 kg)   SpO2: 100% 100%  98%    Intake/Output Summary (Last 24 hours) at 12/02/11 0742 Last data filed at 12/02/11 0700  Gross per 24 hour  Intake 837.81 ml  Output   1525 ml  Net -687.19 ml    LABS: Basic Metabolic Panel:  Basename 12/02/11 0400 12/01/11 0515 11/30/11 0420 11/29/11 2320  NA 133* 134* -- --  K 3.5 3.5 -- --  CL 99 98 -- --  CO2 30 32 -- --  GLUCOSE 92 92 -- --  BUN 11 10 -- --  CREATININE 0.39* 0.38* -- --  CALCIUM 7.6* 8.0* -- --  MG -- -- 2.1 2.2  PHOS -- -- 2.9 2.7   Liver Function Tests:  Basename 12/02/11 0400  AST 21  ALT 9  ALKPHOS --  BILITOT --  PROT --  ALBUMIN --   No results found for this basename: LIPASE:2,AMYLASE:2 in the last 72 hours CBC:  Basename 12/02/11 0400 12/01/11 0515 11/30/11 0420  WBC 23.5* 27.8* --  NEUTROABS -- -- 28.2*  HGB 8.3* 8.4* --  HCT 25.9* 26.2* --  MCV 86.6 85.3 --  PLT 55* 68* --   Cardiac Enzymes:  Basename 12/02/11 0400  CKTOTAL 20  CKMB --  CKMBINDEX --  TROPONINI --   BNP: No components found with this basename: POCBNP:3 D-Dimer: No results found for this basename: DDIMER:2 in the last 72 hours Hemoglobin A1C: No results found for this basename: HGBA1C in the last 72  hours Fasting Lipid Panel: No results found for this basename: CHOL,HDL,LDLCALC,TRIG,CHOLHDL,LDLDIRECT in the last 72 hours Thyroid Function Tests: No results found for this basename: TSH,T4TOTAL,FREET3,T3FREE,THYROIDAB in the last 72 hours Anemia Panel: No results found for this basename: VITAMINB12,FOLATE,FERRITIN,TIBC,IRON,RETICCTPCT in the last 72 hours  RADIOLOGY: Ct Abdomen Pelvis Wo Contrast  11/25/2011  *RADIOLOGY REPORT*  Clinical Data: Elevated INR.  No fever.  Query C difficile sepsis. Query ischemic bowel.  CT ABDOMEN AND PELVIS WITHOUT CONTRAST  Technique:  Multidetector CT imaging of the abdomen and pelvis was performed following the standard protocol without intravenous contrast.  Comparison: 09/24/2011  Findings: Consolidation noted in both lower lobes, right greater than left, with air bronchograms and small bilateral pleural effusions.  Diffuse somewhat striking subcutaneous edema is present suggesting extensive third spacing of fluid.  No pericardial effusion noted.  Marked wall thickening of the colon extends from the mid ascending colon through the transverse, descending, and sigmoid colon to the rectum.  This certainly could represent C difficile colitis; IV contrast  was not administered and accordingly assessment for enhancing pseudomembranes is not feasible.  The colon measures up to 6 cm in diameter.  No definite dilated small bowel is observed.  There appear to be portions of the ventricular peritoneal shunt which may be discontinuous, for example on image 13 of series 2.  Dependent density in the gallbladder could represent vicarious excretion of contrast medium or layering gallstones.  A nasogastric or feeding tube extends into the transverse duodenum. Scattered ascites and mesenteric edema noted.  The extensive subcutaneous edema is particularly prominent in the pelvis, with swelling of the upper thigh regions and in the labia. A Foley catheter is present in the urinary  bladder.  A left femoral line is noted.  IMPRESSION:  1.  Prominent wall thickening throughout most of the colon, which certainly could be a secondary finding from C. difficile colitis. 2.  Bilateral small pleural effusions with bibasilar consolidation. 3.  Extensive subcutaneous and mesenteric edema, with ascites. 4.  Portions of the VP shunt may be discontinuous.  Original Report Authenticated By: Dellia Cloud, M.D.    PHYSICAL EXAM General: frail, NAD Neck: JVP difficult, probably 8-9 cm,, no thyromegaly or thyroid nodule.  Lungs: Clear to auscultation anteriorly with normal respiratory effort. CV: Nondisplaced PMI.  Heart regular S1/S2, no S3/S4, no murmur.  1+ edema bilaterally to knees.  No carotid bruit.  Normal pedal pulses.  Abdomen: Soft, nontender, no hepatosplenomegaly, no distention.  Neurologic: Alert and oriented x 3.  Psych: Normal affect. Extremities: No clubbing or cyanosis.   TELEMETRY: Reviewed telemetry pt in NSR  ASSESSMENT AND PLAN:  56 yo with h/o astrocytoma s/p resection and recent mitral valve repair was admitted with severe C difficile colitis, SIRS, and atrial fibrillation with RVR.  Had probable aspiration PNA with respiratory failure/intubation.  1. Atrial fibrillation: Remains in NSR on amiodarone.  Continue amiodarone.  Will start coumadin for now.  She has a large left atrium with significant risk for recurrent atrial fibrillation.  Will continue coumadin for a period post-hospitalization and then decide on long-term use based on overall stability.  However, she has no significant history of falls that I know of.  2. CHF: Diastolic CHF.  Suspect some volume overload though also expect a significant component of peripheral edema is due to hypoalbuminemia.   - Lasix 20 mg IV x 1 and will check CVPs 3. C diff colitis: Improving, appetite is back. Continue tx per CCM.  4. Needs PT/OT.  Social work needs to start work on rehab placement with family.    [name] 12/02/2011 7:47 AM  CVP is 6, hold off on Lasix.   [name] 12/02/2011 7:55 AM

## 2011-12-02 NOTE — Progress Notes (Signed)
Chaplain had initial visit with patient. Chaplain offered emotional support. Patient stated that she is coping okay. Follow up as needed.

## 2011-12-02 NOTE — Progress Notes (Signed)
Name: Michaela [name] MRN: [id number] DOB: 07/24/1956    LOS: 11  [place] [facility]  History of Present Illness: 56 yo female SND resident with hx cerebral astrocytoma at age 56 s/p resection, severe mitral regurgitation s/p MVR in February with course c/b UTI.  She was admitted 3/14 by [facility] with CDiff colitis and hypotension, PCCM was consulted. Hypotension initially improved with fluid resuscitation and PCCM signed off.  On 3/14 developed worsening hypotension, obtundation and resp failure and PCCM called back to assume ICU care.   Lines / Drains: ETT 3/18>>> 3/22 Left IJ 3/18>>> Rt fem line 3/18>>> 3/22  Cultures: Urine 3/14>>> neg BCx2 3/14>>>neg CDiff 3/14>>> POS Stool 3/14>>>neg BCx2 3/18>>> neg Urine 3/18>>> neg Sputum 3/19>>> neg  Antibiotics: Vanc (enema-Cdiff) 3/18>>> 3/22 Ceftaz (?asp PNA) 3/18>>>3/21 Ceftriaxone 3/21>> 3/23  Flagyl (CDiff) 3/14>>> Vanc (oral - CDiff) 3/14>>>  Tests / Events: 3/14 resp failure, hypotension, intubated 3.18 CT abdo/pelvis -Prominent wall thickening throughout most of the colon, which certainly could be a secondary finding from C. difficile colitis. Bilateral small pleural effusions with bibasilar consolidation. Extensive subcutaneous and mesenteric edema, with ascites. . 3/18 No acute intracranial findings. 3/18 - ARDS 3/19 -off pressors 3/20 afib rvr, neg 4 liters 3/21- neg 1 lit, improved vent 3/20 echo - 55%, mitral appears good 3/21- neg 3 liters  SUBJ: Alert and oriented, mild abdominal pain but distension improved.  Vital Signs: Temp:  [97.4 F (36.3 C)-98.6 F (37 C)] 97.4 F (36.3 C) (03/25 0800) Pulse Rate:  [83-101] 89  (03/25 0800) Resp:  [12-30] 30  (03/25 0800) BP: (96-116)/(56-71) 96/56 mmHg (03/25 0800) SpO2:  [92 %-100 %] 98 % (03/25 0800) FiO2 (%):  [2 %] 2 % (03/25 0000) Weight:  [48.8 kg (107 lb 9.4 oz)] 48.8 kg (107 lb 9.4 oz) (03/25 0546)   Intake/Output Summary (Last 24 hours) at 12/02/11  1036 Last data filed at 12/02/11 0900  Gross per 24 hour  Intake  895.9 ml  Output   1525 ml  Net -629.1 ml   Physical Examination:  General: chronically ill appearing, NAD HEENT: mm dry, line clean Neuro: follows commands, rass 0, course tremor (chronic) Lungs: slight coarse Cards: RRR s M Abd: soft,  NABS Ext: warm and dry, no edema   Labs and Imaging:   CBC    Component Value Date/Time   WBC 23.5* 12/02/2011 0400   RBC 2.99* 12/02/2011 0400   HGB 8.3* 12/02/2011 0400   HCT 25.9* 12/02/2011 0400   PLT 55* 12/02/2011 0400   MCV 86.6 12/02/2011 0400   MCH 27.8 12/02/2011 0400   MCHC 32.0 12/02/2011 0400   RDW 21.8* 12/02/2011 0400   LYMPHSABS 1.9 11/30/2011 0420   MONOABS 1.6* 11/30/2011 0420   EOSABS 0.3 11/30/2011 0420   BASOSABS 0.3* 11/30/2011 0420   BMET    Component Value Date/Time   NA 133* 12/02/2011 0400   K 3.5 12/02/2011 0400   CL 99 12/02/2011 0400   CO2 30 12/02/2011 0400   GLUCOSE 92 12/02/2011 0400   BUN 11 12/02/2011 0400   CREATININE 0.39* 12/02/2011 0400   CREATININE 0.55 11/11/2011 1414   CALCIUM 7.6* 12/02/2011 0400   GFRNONAA >90 12/02/2011 0400   GFRAA >90 12/02/2011 0400    Lab 12/01/11 0515  INR 1.35    Dg Chest Port 1 View  12/01/2011  *RADIOLOGY REPORT*  Clinical Data: Respiratory failure.  PORTABLE CHEST - 1 VIEW  Comparison: 11/30/2011.  Findings: Post valve replacement.  Left central  line tip right atrium level.  To be within the distal superior vena cava, this can be retracted by 4 cm.  Ventriculoperitoneal shunt courses over the right lung.  Asymmetric air space disease greater on the right.  Left-sided pleural effusion.  Findings without significant change.  IMPRESSION: No significant change in diffuse asymmetric air space disease. Please see above.  Original Report Authenticated By: Fuller Canada, M.D.    Assessment and Plan:  Acute Respiratory failure - large edema component, abd distension and likely aspiration PNA, ARDS. Tolerating extubation  well.  - Cont supportive mgmt - Transfer to SDU - Titrate O2 for sat of 92-95%   CDiff colitis -- improved Continue oral vanc, flagyl, has responded to this If clinically cdiff worsen change to fodaximicin  Aspiration PNA--  PLAN -  Clinically improved. CXR lagging.  Cont to monitor clinically and radiographically No indication for additional abx  Hypotension/ septic shock - resolved  Encephalopathy -- resolved  Coagulopathy - in setting coumadin for Afib / sepsis Hep per pharmacy Consider coumadin start soon  Afib rvr - Cards following.  PAF 3/22 PM. NSR now - Amiodarone and Hep gtt per Cards  Chronic diastolic CHF/ mitral regurg - Cards following.  EF55-60% on echo 10/2011 - No further diuresis planned  Hyponatremia - resolved  Anemia --  No evidence of acute blood loss. No indication for PRBCs  Malnourished, severe Diet ordered  Debilitation -Acute on chronic. Presently moderate to severe. PT ordered. Suspect will need Rehab of some sort after discharge, will consult clinical case management once patient is ready.  Koren Bound, MD 12/02/2011  10:36 AM

## 2011-12-03 DIAGNOSIS — I4891 Unspecified atrial fibrillation: Secondary | ICD-10-CM

## 2011-12-03 DIAGNOSIS — J984 Other disorders of lung: Secondary | ICD-10-CM

## 2011-12-03 DIAGNOSIS — I959 Hypotension, unspecified: Secondary | ICD-10-CM

## 2011-12-03 DIAGNOSIS — A0472 Enterocolitis due to Clostridium difficile, not specified as recurrent: Secondary | ICD-10-CM

## 2011-12-03 LAB — BASIC METABOLIC PANEL
BUN: 12 mg/dL (ref 6–23)
Calcium: 8 mg/dL — ABNORMAL LOW (ref 8.4–10.5)
Creatinine, Ser: 0.4 mg/dL — ABNORMAL LOW (ref 0.50–1.10)
GFR calc non Af Amer: >90 mL/min (ref >90)
Glucose, Bld: 93 mg/dL (ref 70–99)
Potassium: 4.4 mEq/L (ref 3.5–5.1)

## 2011-12-03 LAB — CBC
HCT: 26.2 % — ABNORMAL LOW (ref 36.0–46.0)
MCH: 27.9 pg (ref 26.0–34.0)
MCHC: 31.3 g/dL (ref 30.0–36.0)
MCV: 89.1 fL (ref 78.0–100.0)
RDW: 24.8 % — ABNORMAL HIGH (ref 11.5–15.5)

## 2011-12-03 LAB — PHOSPHORUS: Phosphorus: 2.8 mg/dL (ref 2.3–4.6)

## 2011-12-03 MED ORDER — WARFARIN SODIUM 2 MG PO TABS
2.0000 mg | ORAL_TABLET | Freq: Once | ORAL | Status: AC
  Administered 2011-12-03: 2 mg via ORAL
  Filled 2011-12-03: qty 1

## 2011-12-03 MED ORDER — BOOST PLUS PO LIQD
237.0000 mL | Freq: Two times a day (BID) | ORAL | Status: DC
  Administered 2011-12-03 – 2011-12-04 (×3): 237 mL via ORAL
  Filled 2011-12-03 (×6): qty 237

## 2011-12-03 NOTE — Progress Notes (Signed)
Name: Michaela [name] MRN: [id number] DOB: 1956/08/03    LOS: 12  [place] [facility]  History of Present Illness: 56 yo female SND resident with hx cerebral astrocytoma at age 47 s/p resection, severe mitral regurgitation s/p MVR in February with course c/b UTI.  She was admitted 3/14 by [facility] with CDiff colitis and hypotension, PCCM was consulted. Hypotension initially improved with fluid resuscitation and PCCM signed off.  On 3/14 developed worsening hypotension, obtundation and resp failure and PCCM called back to assume ICU care.   Lines / Drains: ETT 3/18>>> 3/22 Left IJ 3/18>>> Rt fem line 3/18>>> 3/22  Cultures: Urine 3/14>>> neg BCx2 3/14>>>neg CDiff 3/14>>> POS Stool 3/14>>>neg BCx2 3/18>>> neg Urine 3/18>>> neg Sputum 3/19>>> neg  Antibiotics: Vanc (enema-Cdiff) 3/18>>> 3/22 Ceftaz (?asp PNA) 3/18>>>3/21 Ceftriaxone 3/21>> 3/23  Flagyl (CDiff) 3/14>>> Vanc (oral - CDiff) 3/14>>>  Tests / Events: 3/14 resp failure, hypotension, intubated 3.18 CT abdo/pelvis -Prominent wall thickening throughout most of the colon, which certainly could be a secondary finding from C. difficile colitis. Bilateral small pleural effusions with bibasilar consolidation. Extensive subcutaneous and mesenteric edema, with ascites. . 3/18 No acute intracranial findings. 3/18 - ARDS 3/19 -off pressors 3/20 afib rvr, neg 4 liters 3/21- neg 1 lit, improved vent 3/20 echo - 55%, mitral appears good 3/21- neg 3 liters  SUBJ: Alert and oriented, mild abdominal pain but distension improved.  Vital Signs: Temp:  [97.1 F (36.2 C)-98.6 F (37 C)] 97.7 F (36.5 C) (03/26 0826) Pulse Rate:  [89-103] 89  (03/26 0840) Resp:  [12-16] 13  (03/26 0840) BP: (88-112)/(52-64) 101/55 mmHg (03/26 0840) SpO2:  [97 %-100 %] 100 % (03/26 0840) Weight:  [48 kg (105 lb 13.1 oz)] 48 kg (105 lb 13.1 oz) (03/26 0500)   Intake/Output Summary (Last 24 hours) at 12/03/11 1039 Last data filed at 12/03/11 0900  Gross per 24 hour  Intake 1502.5 ml  Output   2775 ml  Net -1272.5 ml   Physical Examination:  General: chronically ill appearing, NAD HEENT: mm dry, line clean Neuro: follows commands, rass 0, course tremor (chronic) Lungs: slight coarse Cards: RRR s M Abd: soft,  NABS Ext: warm and dry, no edema   Labs and Imaging:   CBC    Component Value Date/Time   WBC 22.0* 12/03/2011 0430   RBC 2.94* 12/03/2011 0430   HGB 8.2* 12/03/2011 0430   HCT 26.2* 12/03/2011 0430   PLT 51* 12/03/2011 0430   MCV 89.1 12/03/2011 0430   MCH 27.9 12/03/2011 0430   MCHC 31.3 12/03/2011 0430   RDW 24.8* 12/03/2011 0430   LYMPHSABS 1.9 11/30/2011 0420   MONOABS 1.6* 11/30/2011 0420   EOSABS 0.3 11/30/2011 0420   BASOSABS 0.3* 11/30/2011 0420   BMET    Component Value Date/Time   NA 136 12/03/2011 0430   K 4.4 12/03/2011 0430   CL 102 12/03/2011 0430   CO2 31 12/03/2011 0430   GLUCOSE 93 12/03/2011 0430   BUN 12 12/03/2011 0430   CREATININE 0.40* 12/03/2011 0430   CREATININE 0.55 11/11/2011 1414   CALCIUM 8.0* 12/03/2011 0430   GFRNONAA >90 12/03/2011 0430   GFRAA >90 12/03/2011 0430    Lab 12/03/11 0430  INR 1.25    No results found.  Assessment and Plan:  Acute Respiratory failure - large edema component, abd distension and likely aspiration PNA, ARDS. Tolerating extubation well.  - Cont supportive mgmt. - Hold to SDU overnight. - Titrate O2 for sat of 92-95%.  CDiff colitis -- improved Continue oral vanc, flagyl, has responded to this. If clinically cdiff worsen change to fodaximicin.  Aspiration PNA--  PLAN -  Clinically improved. CXR lagging.  Cont to monitor clinically and radiographically. No indication for additional abx.  Hypotension/ septic shock - resolved  Encephalopathy -- resolved  Coagulopathy - in setting coumadin for Afib / sepsis Hep per pharmacy Consider coumadin start soon  Afib rvr - Cards following.  PAF 3/22 PM. NSR now - Amiodarone and Hep gtt per Cards  Chronic  diastolic CHF/ mitral regurg - Cards following.  EF55-60% on echo 10/2011 - No further diuresis planned  Hyponatremia - resolved  Anemia --  No evidence of acute blood loss. No indication for PRBCs  Malnourished, severe Diet ordered  Debilitation -Acute on chronic. Presently moderate to severe. PT ordered. Suspect will need Rehab of some sort after discharge, will consult clinical case management once patient is ready.  Will call GYN to evaluate the patient's vulva and labia as they are extremely swollen.  Koren Bound, MD 12/03/2011  10:39 AM

## 2011-12-03 NOTE — Progress Notes (Deleted)
PT Cancellation Note  Treatment cancelled today due to patient with frequent stools per nursing.  Nursing wants to speak with MD prior to PT/OT seeing patient.  Will attempt to return in pm.  Thanks.Barb Merino 12/03/2011, 9:25 AM  [facility] [phone] (pager)

## 2011-12-03 NOTE — Consult Note (Signed)
Gyn Consult  Hospitalist asked for me to see pt secondary enlarged labia and labia minora.  Pt is 56 y.o. with disabilities stemming from brain tumor admitted 3-14 for sepsis with C diff (pt is in nursing home.)  Nurse states that on admission labia and labia minora appeared swollen, consistent with sepsis.  Since, pt has received enormous amounts of fluid because of her sepsis.  She is now being diuresed but continues to have swollen labia.  She is a pleasant pt who state she has no pain in her genitals.  The labia majora and minora are both swollen as well as her perineum.  The area transilluminates and there is no blood clot, bruising or masses seen.  Although exam is limited by pt's position, zinc oxide cream and stool, there was no blood on genitals or lesions seen.  Swelling is likely that her genitals are the most dependant area of her body (her head and legs are raised) and she does have swelling evident in her hands and feet.    I agree with keeping the foley in until the swelling resolves to about half of what it is now and then reinserting if she is still unable to void.  I agree with trying to contain the stool so it does not continue to irritate labia.  I suggested to the nurse that zinc oxide could be applied directly to the labia (not inside the vagina) for protection.    Narcissa Melder A [id number]

## 2011-12-03 NOTE — Progress Notes (Signed)
PT Cancellation Note  Treatment cancelled today due to medical issues with patient which prohibited therapy.  Nursing ok'd PT/OT to come and work with patient in the pm as she now has flexi-seal.  Upon arrival at 1330, flexi-seal had come out and nursing was cleaning patient.  Will have to return tomorrow.  Thanks.  INGOLD,Latiesha Harada 12/03/2011, 1:51 PM  [facility] [phone] (pager)

## 2011-12-03 NOTE — Progress Notes (Signed)
Aubra Pappalardo, OTR/L Pager: [phone] 12/03/2011   

## 2011-12-03 NOTE — Consult Note (Signed)
WOC consult Note Reason for Consult: req to eval for stool containment ideas.  Pt has Cdiff and continues to have freq. Loose stools that are causing MASD (moisture associated skin damage ) to the buttocks/perineal area.  Discussed with bedside nursing and they have tried fecal pouching without success.  This has actually caused some skin stripping of the rectal skin.  She does have hemorrhoids present.  Recommended Flexiseal for containment of stool.  Reviewed with bedside nursing that balloon should only be filled until indicator pops up on tubing.  Per nursing concerned that her rectal tone may be an issue with keeping Flexiseal in place, would recommend trying this for pt comfort and containment and to limit exposure to skin.    Wound type:MASD (moisture associated skin damage) secondary to exposure to output  Will order Flexiseal to be placed, continue use of zinc barrier cream to affected areas. Clean area of incontinence and reapply zinc PRN.    Re consult if needed, will not follow at this time. Thanks  Tonesha Tsou [employer], CWOCN [phone])

## 2011-12-03 NOTE — Progress Notes (Signed)
Speech Language Pathology Dysphagia Treatment  Patient Details Name: CHRISTEL BAI MRN: [id number] DOB: 1955/10/30 Today's Date: 12/03/2011  SLP Assessment/Plan/Recommendation Assessment / Recommendations / Plan Clinical Impression Statement: Pt appears to be tolerating current diet with no s/s of aspiration observed at bedside, however pt reports that she misses using her straw. SLP to f/u for continued assessment of diet tolerance and possible upgrade to use of straws per pt request.  Plan: Continue with current plan of care Swallowing Goals  SLP Swallowing Goals Patient will consume recommended diet without observed clinical signs of aspiration with: Minimal assistance Swallow Study Goal #1 - Progress: Progressing toward goal Patient will utilize recommended strategies during swallow to increase swallowing safety with: Minimal assistance Swallow Study Goal #2 - Progress: Progressing toward goal  General Temperature Spikes Noted: No Respiratory Status: Supplemental O2 delivered via (comment) (Nasal cannula; 2L) Behavior/Cognition: Alert;Cooperative;Pleasant mood Oral Cavity - Dentition: Adequate natural dentition Patient Positioning: Upright in bed  Oral Cavity - Oral Hygiene Does patient have any of the following "at risk" factors?: Oxygen therapy - cannula, mask, simple oxygen devices Brush patient's teeth BID with toothbrush (using toothpaste with fluoride): Yes Patient is AT RISK - Oral Care Protocol followed (see row info): Yes   Dysphagia Treatment Treatment focused on: Skilled observation of diet tolerance;Patient/family/caregiver education;Utilization of compensatory strategies Family/Caregiver Educated: mother Treatment Methods/Modalities: Skilled observation;Other (comment) (small bites/sips, one sip at a time) Patient observed directly with PO's: Yes Type of PO's observed: Dysphagia 3 (soft);Thin liquids Feeding: Able to feed self;Other (Comment) (min assist  provided with cup sips) Liquids provided via: Cup;No straw Type of cueing:  (supervision) Amount of cueing:  (supervision)   [name] 12/03/2011, 4:36 PM  [name], SLP Student

## 2011-12-03 NOTE — Progress Notes (Signed)
Yarixa Lightcap, OTR/L Pager: [phone] 12/03/2011   

## 2011-12-03 NOTE — Progress Notes (Signed)
ANTICOAGULATION CONSULT NOTE -follow up Consult  Pharmacy Consult for heparin, coumadin Indication: atrial fibrillation  Assessment: 56 year old female with severe mitral regurgitation s/p MVR in February. Patient presented with coagulapathy on warfarin as outpatient for afib, this has since been corrected. Full dose Heparin was started 3/21 and current heparin level is at goal (0.57).  Coumadin restarted 3/25 (home dose 2.5mg /day) and plts at 51K, INR=1.25 (12/01/11), Hg=8.1 and patient on flagyl.  Goal of Therapy:  Heparin level 0.3-0.7 units/ml INR= 2.0-2.5   Plan:  -No heparin changes -Continue coumadin 2mg  today -Daily PT/INRs  Harland German, Pharm D 12/03/2011 8:39 AM    Allergies  Allergen Reactions  . Compazine Other (See Comments)    "eyes get buggy"    Patient Measurements: Height: 4\' 11"  (149.9 cm) Weight: 105 lb 13.1 oz (48 kg) IBW/kg (Calculated) : 43.2  Heparin Dosing Weight: 50kg  Vital Signs: Temp: 97.7 F (36.5 C) (03/26 0826) Temp src: Oral (03/26 0826) BP: 88/64 mmHg (03/26 0800) Pulse Rate: 93  (03/26 0800)  Labs:  Basename 12/03/11 0430 12/02/11 0400 12/01/11 0515  HGB 8.2* 8.3* --  HCT 26.2* 25.9* 26.2*  PLT 51* 55* 68*  APTT -- -- --  LABPROT 16.0* -- 16.9*  INR 1.25 -- 1.35  HEPARINUNFRC 0.57 0.43 0.44  CREATININE 0.40* 0.39* 0.38*  CKTOTAL -- 20 --  CKMB -- -- --  TROPONINI -- -- --   Estimated Creatinine Clearance: 54.8 ml/min (by C-G formula based on Cr of 0.4).  Medical History: Past Medical History  Diagnosis Date  . 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 MVP (mitral valve prolapse)   . Mitral regurgitation   . Shortness of breath   . Heart murmur   . Neuromuscular disorder     numbness in hands and feet  . Arthritis   . Overactive bladder   . Pleural effusion, left 11/11/2011

## 2011-12-03 NOTE — Progress Notes (Signed)
Nutrition Follow-up  Diet Order:  Dysphagia 3, thin liquids, no straws.  S/P extubation. No longer has a sore throat. States her appetite has returned to normal, tolerating D3 diet well. PO intake documented at 50% of one meal. Patient states that she was drinking Boost PTA and would like to continue with this. C. Diff continues to improve, still with multiple BM's daily 3 on 3/25.   Meds: Scheduled Meds:   . amiodarone  200 mg Oral BID  . famotidine  20 mg Oral Daily  . furosemide  40 mg Intravenous Q8H  . metroNIDAZOLE  500 mg Oral Q6H  . potassium chloride  40 mEq Oral TID  . SAILS Study - rosuvastatin / placebo (PI-Wright)  20 mg Oral Daily  . sodium chloride  10 mL Intravenous Q12H  . vancomycin  500 mg Oral Q6H  . warfarin  2 mg Oral ONCE-1800  . warfarin  2 mg Oral ONCE-1800  . Warfarin - Pharmacist Dosing Inpatient   Does not apply q1800  . DISCONTD: furosemide  40 mg Intravenous Q8H  . DISCONTD: sodium chloride  3 mL Intravenous Q12H   Continuous Infusions:   . sodium chloride 20 mL/hr at 11/26/11 2356  . heparin 750 Units/hr (12/02/11 2015)   PRN Meds:.acetaminophen, alum & mag hydroxide-simeth, levalbuterol, ondansetron (ZOFRAN) IV  Labs:  CMP     Component Value Date/Time   NA 136 12/03/2011 0430   K 4.4 12/03/2011 0430   CL 102 12/03/2011 0430   CO2 31 12/03/2011 0430   GLUCOSE 93 12/03/2011 0430   BUN 12 12/03/2011 0430   CREATININE 0.40* 12/03/2011 0430   CREATININE 0.55 11/11/2011 1414   CALCIUM 8.0* 12/03/2011 0430   PROT 4.2* 11/29/2011 0424   ALBUMIN 1.6* 11/29/2011 0424   AST 21 12/02/2011 0400   ALT 9 12/02/2011 0400   ALKPHOS 97 11/29/2011 0424   BILITOT 0.3 11/29/2011 0424   GFRNONAA >90 12/03/2011 0430   GFRAA >90 12/03/2011 0430     Intake/Output Summary (Last 24 hours) at 12/03/11 1019 Last data filed at 12/03/11 0900  Gross per 24 hour  Intake 1502.5 ml  Output   2775 ml  Net -1272.5 ml    Weight Status:  105 lbs, trending down likely related to  negative fluid balance, remains above admission weight.   Re-estimated needs:  1350-1500 kcal, 65-75 gm protein  Nutrition Dx:  Inadequate oral intake, improving  Goal:  Meet >90% of estimated nutrition needs via oral vs nutrition support, unmet New Goal: PO intake of meals and supplements to meet >90% of estimated nutrition needs  Intervention:   1. Per pt preference, RD will add Boost shake BID 2. RD will continue to follow  Monitor:  PO intake, weight, labs, I/O's   Rudean Haskell Pager #:  [phone]

## 2011-12-03 NOTE — Progress Notes (Signed)
PT Cancellation Note  Treatment cancelled today due to patient having frequent stools per nursing and she would like to consult with MD prior to PT/OT treatment.  Will return as able.  Thanks.Barb Merino 12/03/2011, 9:32 AM  [facility] [phone] (pager)

## 2011-12-03 NOTE — Progress Notes (Signed)
Patient ID: Michaela [name], female   DOB: [birth year], 56 y.o.   MRN: [id number]     SUBJECTIVE: No abdominal discomfort.  No dyspnea.  Remains in NSR.     Marland Kitchen amiodarone  200 mg Oral BID  . famotidine  20 mg Oral Daily  . furosemide  40 mg Intravenous Q8H  . metroNIDAZOLE  500 mg Oral Q6H  . potassium chloride  40 mEq Oral TID  . SAILS Study - rosuvastatin / placebo (PI-Wright)  20 mg Oral Daily  . sodium chloride  10 mL Intravenous Q12H  . vancomycin  500 mg Oral Q6H  . warfarin  2 mg Oral ONCE-1800  . Warfarin - Pharmacist Dosing Inpatient   Does not apply q1800  . DISCONTD: furosemide  20 mg Intravenous Once  . DISCONTD: furosemide  40 mg Intravenous Q8H  . DISCONTD: potassium chloride  40 mEq Oral Once  . DISCONTD: potassium chloride  40 mEq Oral Once  . DISCONTD: sodium chloride  3 mL Intravenous Q12H  heparin gtt    Filed Vitals:   12/03/11 0200 12/03/11 0400 12/03/11 0500 12/03/11 0600  BP: 95/59 104/60  112/56  Pulse: 98 95  95  Temp:  97.1 F (36.2 C)    TempSrc:  Oral    Resp: 14 16  14   Height:      Weight:   105 lb 13.1 oz (48 kg)   SpO2: 100% 100%  100%    Intake/Output Summary (Last 24 hours) at 12/03/11 0749 Last data filed at 12/03/11 0700  Gross per 24 hour  Intake   1670 ml  Output   2775 ml  Net  -1105 ml    LABS: Basic Metabolic Panel:  Basename 12/03/11 0430 12/02/11 0400  NA 136 133*  K 4.4 3.5  CL 102 99  CO2 31 30  GLUCOSE 93 92  BUN 12 11  CREATININE 0.40* 0.39*  CALCIUM 8.0* 7.6*  MG 2.0 --  PHOS 2.8 --   Liver Function Tests:  Basename 12/02/11 0400  AST 21  ALT 9  ALKPHOS --  BILITOT --  PROT --  ALBUMIN --   No results found for this basename: LIPASE:2,AMYLASE:2 in the last 72 hours CBC:  Basename 12/03/11 0430 12/02/11 0400  WBC 22.0* 23.5*  NEUTROABS -- --  HGB 8.2* 8.3*  HCT 26.2* 25.9*  MCV 89.1 86.6  PLT 51* 55*   Cardiac Enzymes:  Basename 12/02/11 0400  CKTOTAL 20  CKMB --  CKMBINDEX --    TROPONINI --   RADIOLOGY: Ct Abdomen Pelvis Wo Contrast  11/25/2011  *RADIOLOGY REPORT*  Clinical Data: Elevated INR.  No fever.  Query C difficile sepsis. Query ischemic bowel.  CT ABDOMEN AND PELVIS WITHOUT CONTRAST  Technique:  Multidetector CT imaging of the abdomen and pelvis was performed following the standard protocol without intravenous contrast.  Comparison: 09/24/2011  Findings: Consolidation noted in both lower lobes, right greater than left, with air bronchograms and small bilateral pleural effusions.  Diffuse somewhat striking subcutaneous edema is present suggesting extensive third spacing of fluid.  No pericardial effusion noted.  Marked wall thickening of the colon extends from the mid ascending colon through the transverse, descending, and sigmoid colon to the rectum.  This certainly could represent C difficile colitis; IV contrast was not administered and accordingly assessment for enhancing pseudomembranes is not feasible.  The colon measures up to 6 cm in diameter.  No definite dilated small bowel is observed.  There appear to  be portions of the ventricular peritoneal shunt which may be discontinuous, for example on image 13 of series 2.  Dependent density in the gallbladder could represent vicarious excretion of contrast medium or layering gallstones.  A nasogastric or feeding tube extends into the transverse duodenum. Scattered ascites and mesenteric edema noted.  The extensive subcutaneous edema is particularly prominent in the pelvis, with swelling of the upper thigh regions and in the labia. A Foley catheter is present in the urinary bladder.  A left femoral line is noted.  IMPRESSION:  1.  Prominent wall thickening throughout most of the colon, which certainly could be a secondary finding from C. difficile colitis. 2.  Bilateral small pleural effusions with bibasilar consolidation. 3.  Extensive subcutaneous and mesenteric edema, with ascites. 4.  Portions of the VP shunt may be  discontinuous.  Original Report Authenticated By: Dellia Cloud, M.D.    PHYSICAL EXAM General: frail, NAD Neck: JVP not elevated, no thyromegaly or thyroid nodule.  Lungs: Clear to auscultation anteriorly with normal respiratory effort. CV: Nondisplaced PMI.  Heart regular S1/S2, no S3/S4, no murmur. 1+ edema hands/feet.  No carotid bruit.  Normal pedal pulses.  Abdomen: Soft, nontender, no hepatosplenomegaly, no distention.  Neurologic: Alert and oriented x 3.  Psych: Normal affect. Extremities: No clubbing or cyanosis.   TELEMETRY: Reviewed telemetry pt in NSR  ASSESSMENT AND PLAN:  56 yo with h/o astrocytoma s/p resection and recent mitral valve repair was admitted with severe C difficile colitis, SIRS, and atrial fibrillation with RVR.  Had probable aspiration PNA with respiratory failure/intubation.  1. Atrial fibrillation: Remains in NSR on amiodarone.  Continue amiodarone.  She has a large left atrium with significant risk for recurrent atrial fibrillation.  Will continue coumadin for a period post-hospitalization and then decide on long-term use based on overall stability.  However, she has no significant history of falls that I know of.  2. CHF: Diastolic CHF.  CVP is 2 this am.  She has some edema of hands and feet, suspect hypoalbuminemia is playing a large role with this.  Will not give Lasix this morning given low CVP.  3. C diff colitis: Improving, appetite is back. Continue tx per CCM.  4. PT/OT.  Potential LTAC placement.   [name] 12/03/2011 7:49 AM

## 2011-12-04 ENCOUNTER — Inpatient Hospital Stay
Admission: RE | Admit: 2011-12-04 | Discharge: 2011-12-24 | Disposition: A | Discharge status: Skilled Nursing Facility | Payer: Self-pay | Source: Ambulatory Visit | Attending: Internal Medicine | Admitting: Internal Medicine

## 2011-12-04 DIAGNOSIS — E441 Mild protein-calorie malnutrition: Secondary | ICD-10-CM | POA: Diagnosis not present

## 2011-12-04 DIAGNOSIS — I959 Hypotension, unspecified: Secondary | ICD-10-CM | POA: Diagnosis not present

## 2011-12-04 DIAGNOSIS — M1990 Unspecified osteoarthritis, unspecified site: Secondary | ICD-10-CM | POA: Diagnosis present

## 2011-12-04 DIAGNOSIS — G709 Myoneural disorder, unspecified: Secondary | ICD-10-CM | POA: Diagnosis present

## 2011-12-04 DIAGNOSIS — I059 Rheumatic mitral valve disease, unspecified: Secondary | ICD-10-CM | POA: Diagnosis present

## 2011-12-04 DIAGNOSIS — I509 Heart failure, unspecified: Secondary | ICD-10-CM | POA: Diagnosis present

## 2011-12-04 DIAGNOSIS — Z792 Long term (current) use of antibiotics: Secondary | ICD-10-CM | POA: Diagnosis not present

## 2011-12-04 DIAGNOSIS — E871 Hypo-osmolality and hyponatremia: Secondary | ICD-10-CM | POA: Diagnosis not present

## 2011-12-04 DIAGNOSIS — A0472 Enterocolitis due to Clostridium difficile, not specified as recurrent: Secondary | ICD-10-CM | POA: Diagnosis not present

## 2011-12-04 DIAGNOSIS — D72829 Elevated white blood cell count, unspecified: Secondary | ICD-10-CM | POA: Diagnosis present

## 2011-12-04 DIAGNOSIS — R05 Cough: Secondary | ICD-10-CM | POA: Diagnosis not present

## 2011-12-04 DIAGNOSIS — I10 Essential (primary) hypertension: Secondary | ICD-10-CM | POA: Diagnosis present

## 2011-12-04 DIAGNOSIS — R197 Diarrhea, unspecified: Secondary | ICD-10-CM | POA: Diagnosis not present

## 2011-12-04 DIAGNOSIS — IMO0002 Reserved for concepts with insufficient information to code with codable children: Secondary | ICD-10-CM | POA: Diagnosis not present

## 2011-12-04 DIAGNOSIS — E43 Unspecified severe protein-calorie malnutrition: Secondary | ICD-10-CM | POA: Diagnosis present

## 2011-12-04 DIAGNOSIS — I5032 Chronic diastolic (congestive) heart failure: Secondary | ICD-10-CM | POA: Diagnosis present

## 2011-12-04 DIAGNOSIS — J984 Other disorders of lung: Secondary | ICD-10-CM | POA: Diagnosis not present

## 2011-12-04 DIAGNOSIS — J189 Pneumonia, unspecified organism: Secondary | ICD-10-CM | POA: Diagnosis not present

## 2011-12-04 DIAGNOSIS — D509 Iron deficiency anemia, unspecified: Secondary | ICD-10-CM | POA: Diagnosis not present

## 2011-12-04 DIAGNOSIS — R5381 Other malaise: Secondary | ICD-10-CM | POA: Diagnosis not present

## 2011-12-04 DIAGNOSIS — Z954 Presence of other heart-valve replacement: Secondary | ICD-10-CM | POA: Diagnosis not present

## 2011-12-04 DIAGNOSIS — R1013 Epigastric pain: Secondary | ICD-10-CM | POA: Diagnosis not present

## 2011-12-04 DIAGNOSIS — D6959 Other secondary thrombocytopenia: Secondary | ICD-10-CM | POA: Diagnosis not present

## 2011-12-04 DIAGNOSIS — R5383 Other fatigue: Secondary | ICD-10-CM | POA: Diagnosis present

## 2011-12-04 DIAGNOSIS — Z5189 Encounter for other specified aftercare: Secondary | ICD-10-CM | POA: Diagnosis not present

## 2011-12-04 DIAGNOSIS — I4891 Unspecified atrial fibrillation: Secondary | ICD-10-CM | POA: Diagnosis present

## 2011-12-04 DIAGNOSIS — R1110 Vomiting, unspecified: Secondary | ICD-10-CM | POA: Diagnosis not present

## 2011-12-04 DIAGNOSIS — A419 Sepsis, unspecified organism: Secondary | ICD-10-CM | POA: Diagnosis present

## 2011-12-04 DIAGNOSIS — Z9889 Other specified postprocedural states: Secondary | ICD-10-CM | POA: Diagnosis not present

## 2011-12-04 DIAGNOSIS — R509 Fever, unspecified: Secondary | ICD-10-CM | POA: Diagnosis not present

## 2011-12-04 DIAGNOSIS — N318 Other neuromuscular dysfunction of bladder: Secondary | ICD-10-CM | POA: Diagnosis present

## 2011-12-04 DIAGNOSIS — R279 Unspecified lack of coordination: Secondary | ICD-10-CM | POA: Diagnosis not present

## 2011-12-04 DIAGNOSIS — R195 Other fecal abnormalities: Secondary | ICD-10-CM | POA: Diagnosis not present

## 2011-12-04 DIAGNOSIS — A414 Sepsis due to anaerobes: Secondary | ICD-10-CM | POA: Diagnosis not present

## 2011-12-04 DIAGNOSIS — J90 Pleural effusion, not elsewhere classified: Secondary | ICD-10-CM | POA: Diagnosis not present

## 2011-12-04 DIAGNOSIS — D649 Anemia, unspecified: Secondary | ICD-10-CM | POA: Diagnosis present

## 2011-12-04 DIAGNOSIS — M6281 Muscle weakness (generalized): Secondary | ICD-10-CM | POA: Diagnosis not present

## 2011-12-04 DIAGNOSIS — D696 Thrombocytopenia, unspecified: Secondary | ICD-10-CM | POA: Diagnosis not present

## 2011-12-04 LAB — BASIC METABOLIC PANEL
GFR calc Af Amer: >90 mL/min (ref >90)
GFR calc non Af Amer: >90 mL/min (ref >90)
Glucose, Bld: 92 mg/dL (ref 70–99)
Potassium: 4.2 mEq/L (ref 3.5–5.1)
Sodium: 136 mEq/L (ref 135–145)

## 2011-12-04 LAB — CBC
HCT: 23.6 % — ABNORMAL LOW (ref 36.0–46.0)
HCT: 24.9 % — ABNORMAL LOW (ref 36.0–46.0)
Hemoglobin: 7.4 g/dL — ABNORMAL LOW (ref 12.0–15.0)
MCH: 29.1 pg (ref 26.0–34.0)
MCH: 29.3 pg (ref 26.0–34.0)
MCV: 93.6 fL (ref 78.0–100.0)
Platelets: 77 10*3/uL — ABNORMAL LOW (ref 150–400)
RBC: 2.54 MIL/uL — ABNORMAL LOW (ref 3.87–5.11)
RBC: 2.66 MIL/uL — ABNORMAL LOW (ref 3.87–5.11)
WBC: 15.6 10*3/uL — ABNORMAL HIGH (ref 4.0–10.5)

## 2011-12-04 LAB — HEPARIN LEVEL (UNFRACTIONATED): Heparin Unfractionated: 0.6 IU/mL (ref 0.30–0.70)

## 2011-12-04 LAB — PHOSPHORUS: Phosphorus: 2.8 mg/dL (ref 2.3–4.6)

## 2011-12-04 MED ORDER — AMIODARONE HCL 200 MG PO TABS: 200.0000 mg | ORAL_TABLET | Freq: Two times a day (BID) | ORAL | Status: DC

## 2011-12-04 MED ORDER — VANCOMYCIN 50 MG/ML ORAL SOLUTION: 500.0000 mg | Freq: Four times a day (QID) | ORAL | Status: DC

## 2011-12-04 MED ORDER — WARFARIN SODIUM 3 MG PO TABS: 3.0000 mg | ORAL_TABLET | Freq: Once | ORAL | Status: DC

## 2011-12-04 MED ORDER — ONDANSETRON HCL 4 MG/2ML IJ SOLN: 4.0000 mg | Freq: Four times a day (QID) | INTRAMUSCULAR | Status: DC | PRN

## 2011-12-04 MED ORDER — FAMOTIDINE 20 MG PO TABS: 20.0000 mg | ORAL_TABLET | Freq: Every day | ORAL | Status: DC

## 2011-12-04 MED ORDER — LEVALBUTEROL HCL 0.63 MG/3ML IN NEBU: 0.6300 mg | INHALATION_SOLUTION | Freq: Four times a day (QID) | RESPIRATORY_TRACT | Status: DC | PRN

## 2011-12-04 MED ORDER — WARFARIN SODIUM 3 MG PO TABS
3.0000 mg | ORAL_TABLET | Freq: Once | ORAL | Status: AC
  Administered 2011-12-04: 3 mg via ORAL
  Filled 2011-12-04: qty 1

## 2011-12-04 MED ORDER — METRONIDAZOLE 500 MG PO TABS: 500.0000 mg | ORAL_TABLET | Freq: Four times a day (QID) | ORAL | Status: AC

## 2011-12-04 NOTE — Progress Notes (Signed)
Physical Therapy Treatment Patient Details Name: NEAH SPORRER MRN: [id number] DOB: 1956-06-14 Today's Date: 12/04/2011  PT Assessment/Plan  PT - Assessment/Plan Comments on Treatment Session: Patient with C-diff and nursing does not feel that it would be beneficial for patient to get OOB to chair with the frequent stools.  Educated patient and her mom as to UE exercises with theraband and LE exercises.  Continue PT as able.   PT Plan: Discharge plan remains appropriate;Frequency remains appropriate PT Frequency: Min 2X/week Recommendations for Other Services: OT consult Follow Up Recommendations: Skilled nursing facility;Supervision/Assistance - 24 hour Equipment Recommended: Defer to next venue PT Goals  Acute Rehab PT Goals Pt will Perform Home Exercise Program: with supervision, verbal cues required/provided PT Goal: Perform Home Exercise Program - Progress: Goal set today  PT Treatment Precautions/Restrictions  Precautions Precautions: Fall Restrictions Weight Bearing Restrictions: No RLE Weight Bearing: Non weight bearing LLE Weight Bearing: Non weight bearing Other Position/Activity Restrictions: pt doesn't have WBing Restrictions.  pt has been W/C bound for 53yrs per Mother.   Mobility (including Balance) Bed Mobility Sitting - Scoot to Edge of Bed: Not tested (comment) Transfers Stand Pivot Transfers: Not tested (comment)    Exercise  General Exercises - Upper Extremity Shoulder Flexion: AAROM;Theraband;Both;5 reps;Supine Theraband Level (Shoulder Flexion): Level 3 (Green) Shoulder Horizontal ABduction: AROM;Theraband;Both;5 reps;Supine Theraband Level (Shoulder Horizontal Abduction): Level 3 (Green) Shoulder Horizontal ADduction: AROM;Both;5 reps;Supine;Theraband Theraband Level (Shoulder Horizontal Adduction): Level 3 (Green) Elbow Flexion: AROM;Both;5 reps;Supine;Theraband Theraband Level (Elbow Flexion): Level 3 (Green) General Exercises - Lower  Extremity Ankle Circles/Pumps: AROM;Both;5 reps;Supine Heel Slides: AROM;Both;5 reps;Supine End of Session PT - End of Session Activity Tolerance: Patient tolerated treatment well Patient left: in bed;with call bell in reach;with family/visitor present Nurse Communication: Need for lift equipment General Behavior During Session: [facility] for tasks performed Cognition: [facility] for tasks performed  INGOLD,Nelda Luckey 12/04/2011, 12:14 PM [facility] [phone] (pager)

## 2011-12-04 NOTE — Evaluation (Signed)
Agree with student note below  Royce Macadamia M.Ed [employer] [phone]  12/04/2011

## 2011-12-04 NOTE — Progress Notes (Signed)
Patient ID: Michaela [name], female   DOB: 23-Aug-1956, 56 y.o.   MRN: [id number]     SUBJECTIVE: No abdominal discomfort.  No dyspnea.  Remains in NSR.  Still with profuse diarrhea.  Some streaks of blood in stool and hemoglobin is lower.     Marland Kitchen amiodarone  200 mg Oral BID  . famotidine  20 mg Oral Daily  . lactose free nutrition  237 mL Oral BID BM  . metroNIDAZOLE  500 mg Oral Q6H  . sodium chloride  10 mL Intravenous Q12H  . vancomycin  500 mg Oral Q6H  . warfarin  2 mg Oral ONCE-1800  . warfarin  3 mg Oral ONCE-1800  . Warfarin - Pharmacist Dosing Inpatient   Does not apply q1800  . DISCONTD: SAILS Study - rosuvastatin / placebo (PI-Wright)  20 mg Oral Daily  heparin gtt    Filed Vitals:   12/04/11 0400 12/04/11 0500 12/04/11 0757 12/04/11 0800  BP: 101/58  97/57 97/57  Pulse: 98 97  96  Temp: 97.8 F (36.6 C)   97.4 F (36.3 C)  TempSrc: Oral   Oral  Resp: 16 13  15   Height:      Weight:  102 lb 8.2 oz (46.5 kg)    SpO2: 100% 93%  93%    Intake/Output Summary (Last 24 hours) at 12/04/11 1024 Last data filed at 12/04/11 0904  Gross per 24 hour  Intake 1060.33 ml  Output   1255 ml  Net -194.67 ml    LABS: Basic Metabolic Panel:  Basename 12/04/11 0455 12/03/11 0430  NA 136 136  K 4.2 4.4  CL 102 102  CO2 31 31  GLUCOSE 92 93  BUN 15 12  CREATININE 0.39* 0.40*  CALCIUM 7.9* 8.0*  MG 2.1 2.0  PHOS 2.8 2.8   Liver Function Tests:  Basename 12/02/11 0400  AST 21  ALT 9  ALKPHOS --  BILITOT --  PROT --  ALBUMIN --   No results found for this basename: LIPASE:2,AMYLASE:2 in the last 72 hours CBC:  Basename 12/04/11 0455 12/03/11 0430  WBC 15.9* 22.0*  NEUTROABS -- --  HGB 7.4* 8.2*  HCT 23.6* 26.2*  MCV 92.9 89.1  PLT 57* 51*   Cardiac Enzymes:  Basename 12/02/11 0400  CKTOTAL 20  CKMB --  CKMBINDEX --  TROPONINI --   RADIOLOGY: Ct Abdomen Pelvis Wo Contrast  11/25/2011  *RADIOLOGY REPORT*  Clinical Data: Elevated INR.  No fever.   Query C difficile sepsis. Query ischemic bowel.  CT ABDOMEN AND PELVIS WITHOUT CONTRAST  Technique:  Multidetector CT imaging of the abdomen and pelvis was performed following the standard protocol without intravenous contrast.  Comparison: 09/24/2011  Findings: Consolidation noted in both lower lobes, right greater than left, with air bronchograms and small bilateral pleural effusions.  Diffuse somewhat striking subcutaneous edema is present suggesting extensive third spacing of fluid.  No pericardial effusion noted.  Marked wall thickening of the colon extends from the mid ascending colon through the transverse, descending, and sigmoid colon to the rectum.  This certainly could represent C difficile colitis; IV contrast was not administered and accordingly assessment for enhancing pseudomembranes is not feasible.  The colon measures up to 6 cm in diameter.  No definite dilated small bowel is observed.  There appear to be portions of the ventricular peritoneal shunt which may be discontinuous, for example on image 13 of series 2.  Dependent density in the gallbladder could represent vicarious excretion of  contrast medium or layering gallstones.  A nasogastric or feeding tube extends into the transverse duodenum. Scattered ascites and mesenteric edema noted.  The extensive subcutaneous edema is particularly prominent in the pelvis, with swelling of the upper thigh regions and in the labia. A Foley catheter is present in the urinary bladder.  A left femoral line is noted.  IMPRESSION:  1.  Prominent wall thickening throughout most of the colon, which certainly could be a secondary finding from C. difficile colitis. 2.  Bilateral small pleural effusions with bibasilar consolidation. 3.  Extensive subcutaneous and mesenteric edema, with ascites. 4.  Portions of the VP shunt may be discontinuous.  Original Report Authenticated By: Dellia Cloud, M.D.    PHYSICAL EXAM General: frail, NAD Neck: JVP not  elevated, no thyromegaly or thyroid nodule.  Lungs: Clear to auscultation anteriorly with normal respiratory effort. CV: Nondisplaced PMI.  Heart regular S1/S2, no S3/S4, no murmur. 1+ edema hands/feet.  No carotid bruit.  Normal pedal pulses.  Abdomen: Soft, nontender, no hepatosplenomegaly, no distention.  Neurologic: Alert and oriented x 3.  Psych: Normal affect. Extremities: No clubbing or cyanosis.   TELEMETRY: Reviewed telemetry pt in NSR  ASSESSMENT AND PLAN:  57 yo with h/o astrocytoma s/p resection and recent mitral valve repair was admitted with severe C difficile colitis, SIRS, and atrial fibrillation with RVR.  Had probable aspiration PNA with respiratory failure/intubation.  1. Atrial fibrillation: Remains in NSR on amiodarone.  Continue amiodarone.  She has a large left atrium with significant risk for recurrent atrial fibrillation.  Will continue coumadin for a period post-hospitalization and then decide on long-term use based on overall stability.  However, she has no significant history of falls that I know of.  2. CHF: Diastolic CHF.  CVP is 2 this am.  She has some edema of hands and feet, suspect hypoalbuminemia is playing a large role with this.  Will not give Lasix this morning given CVP 6.  3. C diff colitis: Improving, appetite is back. Still with diarrhea, on po vancomycin.  Continue tx per CCM.  4. PT/OT.  Potential LTAC placement.  5. Anemia: Fall in hemoglobin.  Some streaks of red in stool.  Discontinue heparin gtt.  Repeat CBC this pm, transfuse hemoglobin < 7.  Will continue coumadin for now (INR not therapeutic) but if hemoglobin continues to fall may need to stop.   [name] 12/04/2011 10:24 AM

## 2011-12-04 NOTE — Progress Notes (Addendum)
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  She was admitted 3/14 by [facility] with CDiff colitis and hypotension, PCCM was consulted. Hypotension initially improved with fluid resuscitation and PCCM signed off.  On 3/14 developed worsening hypotension, obtundation and resp failure and PCCM called back to assume ICU care.   Lines / Drains: ETT 3/18>>> 3/22 Left IJ 3/18>>> Rt fem line 3/18>>> 3/22  Cultures: Urine 3/14>>> neg BCx2 3/14>>>neg CDiff 3/14>>> POS Stool 3/14>>>neg BCx2 3/18>>> neg Urine 3/18>>> neg Sputum 3/19>>> neg  Antibiotics: Vanc (enema-Cdiff) 3/18>>> 3/22 Ceftaz (?asp PNA) 3/18>>>3/21 Ceftriaxone 3/21>> 3/23  Flagyl (CDiff) 3/14>>> Vanc (oral - CDiff) 3/14>>>  Tests / Events: 3/14 resp failure, hypotension, intubated 3.18 CT abdo/pelvis -Prominent wall thickening throughout most of the colon, which certainly could be a secondary finding from C. difficile colitis. Bilateral small pleural effusions with bibasilar consolidation. Extensive subcutaneous and mesenteric edema, with ascites. . 3/18 No acute intracranial findings. 3/18 - ARDS 3/19 -off pressors 3/20 afib rvr, neg 4 liters 3/21- neg 1 lit, improved vent 3/20 echo - 55%, mitral appears good 3/21- neg 3 liters  SUBJ: Alert and oriented, mild abdominal pain but distension improved.  Vital Signs: Temp:  [97.4 F (36.3 C)-98.1 F (36.7 C)] 97.4 F (36.3 C) (03/27 0800) Pulse Rate:  [96-105] 96  (03/27 0800) Resp:  [12-16] 15  (03/27 0800) BP: (93-101)/(46-58) 97/57 mmHg (03/27 0800) SpO2:  [93 %-100 %] 93 % (03/27 0800) Weight:  [46.5 kg (102 lb 8.2 oz)] 46.5 kg (102 lb 8.2 oz) (03/27 0500)   Intake/Output Summary (Last 24 hours) at 12/04/11 1110 Last data filed at 12/04/11 1100  Gross per 24 hour  Intake 1317.83 ml  Output   1255 ml  Net  62.83 ml   Physical Examination:  General: chronically ill appearing, NAD HEENT: mm dry, line clean Neuro: follows commands, rass 0, course tremor (chronic) Lungs: slight coarse Cards: RRR s M Abd: soft,  NABS Ext: warm and dry, no edema   Labs and Imaging:   CBC    Component Value Date/Time   WBC 15.9* 12/04/2011 0455   RBC 2.54* 12/04/2011 0455   HGB 7.4* 12/04/2011 0455   HCT 23.6* 12/04/2011 0455   PLT 57* 12/04/2011 0455   MCV 92.9 12/04/2011 0455   MCH 29.1 12/04/2011 0455   MCHC 31.4 12/04/2011 0455   RDW 25.8* 12/04/2011 0455   LYMPHSABS 1.9 11/30/2011 0420   MONOABS 1.6* 11/30/2011 0420   EOSABS 0.3 11/30/2011 0420   BASOSABS 0.3* 11/30/2011 0420   BMET    Component Value Date/Time   NA 136 12/04/2011 0455   K 4.2 12/04/2011 0455   CL 102 12/04/2011 0455   CO2 31 12/04/2011 0455   GLUCOSE 92 12/04/2011 0455   BUN 15 12/04/2011 0455   CREATININE 0.39* 12/04/2011 0455   CREATININE 0.55 11/11/2011 1414   CALCIUM 7.9* 12/04/2011 0455   GFRNONAA >90 12/04/2011 0455   GFRAA >90 12/04/2011 0455    Lab 12/04/11 0455  INR 1.32    No results found.  Assessment and Plan:  Acute Respiratory failure - large edema component, abd distension and likely aspiration PNA, ARDS. Tolerating extubation well.  - Cont supportive mgmt. - Hold to SDU overnight. - Titrate O2 for sat of 92-95%.  CDiff colitis -- improved. Continue oral vanc, flagyl, has responded to this. If clinically cdiff worsen change to fodaximicin.  Aspiration PNA--  PLAN -  Clinically improved. CXR lagging.  Cont to monitor clinically and radiographically. No indication for additional abx.  Hypotension/ septic shock - resolved  Encephalopathy -- resolved  Coagulopathy - in setting coumadin for Afib / sepsis Hep per pharmacy Continue coumadin per pharmacy.  Afib rvr - Cards following.  PAF 3/22 PM. NSR now - Amiodarone and Hep gtt per  Cards.  Chronic diastolic CHF/ mitral regurg - Cards following.  EF55-60% on echo 10/2011 - No further diuresis planned  Hyponatremia - resolved  Anemia --  No evidence of acute blood loss.  But Hg did drop, will hold heparin and send stool OB.  Malnourished, severe Diet ordered  Debilitation -Acute on chronic. Presently moderate to severe. PT ordered. Suspect will need Rehab after discharge.  [facility] and will d/c there upon bed availability.  Koren Bound, MD 12/04/2011  11:10 AM

## 2011-12-04 NOTE — Progress Notes (Signed)
ANTICOAGULATION CONSULT NOTE -follow up Consult  Pharmacy Consult for heparin, coumadin Indication: atrial fibrillation  Assessment: 56 year old female with severe mitral regurgitation s/p MVR in February. Patient presented with coagulapathy on warfarin as outpatient for afib, this has since been corrected. Full dose Heparin was started 3/21 and current heparin level is at goal (0.6).  Coumadin restarted 3/25 (home dose 2.5mg /day) and plts at 57K, INR=1.32, Hg=7.4 and patient on flagyl.  Goal of Therapy:  Heparin level 0.3-0.7 units/ml INR= 2.0-2.5   Plan:  -No heparin changes -Will increase coumadin to 3mg  po today -Daily PT/INRs  Harland German, Pharm D 12/04/2011 9:56 AM    Allergies  Allergen Reactions  . Compazine Other (See Comments)    "eyes get buggy"    Patient Measurements: Height: 4\' 11"  (149.9 cm) Weight: 102 lb 8.2 oz (46.5 kg) IBW/kg (Calculated) : 43.2  Heparin Dosing Weight: 50kg  Vital Signs: Temp: 97.4 F (36.3 C) (03/27 0800) Temp src: Oral (03/27 0800) BP: 97/57 mmHg (03/27 0800) Pulse Rate: 96  (03/27 0800)  Labs:  Basename 12/04/11 0455 12/03/11 0430 12/02/11 0400  HGB 7.4* 8.2* --  HCT 23.6* 26.2* 25.9*  PLT 57* 51* 55*  APTT -- -- --  LABPROT 16.6* 16.0* --  INR 1.32 1.25 --  HEPARINUNFRC 0.60 0.57 0.43  CREATININE 0.39* 0.40* 0.39*  CKTOTAL -- -- 20  CKMB -- -- --  TROPONINI -- -- --   Estimated Creatinine Clearance: 54.8 ml/min (by C-G formula based on Cr of 0.39).  Medical History: Past Medical History  Diagnosis Date  . 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 MVP (mitral valve prolapse)   . Mitral regurgitation   . Shortness of breath   . Heart murmur   . Neuromuscular disorder     numbness in hands and feet  . Arthritis   . Overactive bladder   . Pleural effusion, left 11/11/2011

## 2011-12-04 NOTE — Evaluation (Signed)
Occupational Therapy Evaluation Patient Details Name: Michaela [name] MRN: [id number] DOB: 1956-05-02 Today's Date: 12/04/2011  Problem List:  Patient Active Problem List  Diagnoses  . Mitral regurgitation due to cusp prolapse  . S/P mitral valve repair  . Brain tumor, astrocytoma  . Neuromuscular disorder  . Neurogenic bladder  . Arthritis  . Pleural effusion, left  . Diastolic CHF, chronic  . Pneumonia  . Diarrhea  . Dark stools  . SIRS (systemic inflammatory response syndrome)  . Hypotension  . Encephalopathy acute  . Hyponatremia  . Dehydration  . Leukocytosis  . Chest pain  . Anemia  . Atrial fibrillation with RVR  . C. difficile colitis  . Septic shock  . Acute respiratory failure    Past Medical History:  Past Medical History  Diagnosis Date  . 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 MVP (mitral valve prolapse)   . Mitral regurgitation   . Shortness of breath   . Heart murmur   . Neuromuscular disorder     numbness in hands and feet  . Arthritis   . Overactive bladder   . Pleural effusion, left 11/11/2011   Past Surgical History:  Past Surgical History  Procedure Date  . Ventriculoperitoneal shunt 1971  . Knee arthroscopy     right  . Cardiac catheterization     Jan 2013  . Transesophageal echocardiogram 7/12  . US echocardiography 6/12  . Mitral valve repair 10/15/2011    Procedure: MINIMALLY INVASIVE MITRAL VALVE REPAIR (MVR);  Surgeon: Purcell Nails, MD;  Location: [facility] OR;  Service: Open Heart Surgery;  Laterality: Right;    OT Assessment/Plan/Recommendation OT Assessment Clinical Impression Statement: 56 yr old female admitted with ongoing diarrhea and c-diff.  Currently with increased dependency for basic selfcare tasks at a max to toatl assist level currently.  Feel she will benefit from acute OT services to address these issues in order to increase her strength and overall independence.  Feel she will benefit from  follow-up LTAC rehab vs SNF prior to return home. OT Recommendation/Assessment: Patient will need skilled OT in the acute care venue OT Problem List: Decreased strength;Decreased activity tolerance;Impaired balance (sitting and/or standing);Decreased coordination;Decreased knowledge of use of DME or AE;Pain Barriers to Discharge: Decreased caregiver support OT Therapy Diagnosis : Generalized weakness OT Plan OT Frequency: Min 2X/week OT Treatment/Interventions: Self-care/ADL training;Balance training;Patient/family education;Therapeutic activities;DME and/or AE instruction;Therapeutic exercise OT Recommendation Follow Up Recommendations: LTACH Equipment Recommended: Defer to next venue Individuals Consulted Consulted and Agree with Results and Recommendations: Patient OT Goals Acute Rehab OT Goals OT Goal Formulation: With patient/family Time For Goal Achievement: 2 weeks ADL Goals Pt Will Perform Eating: with supervision;Supported;Sitting, chair;with adaptive utensils ADL Goal: Eating - Progress: Goal set today Pt Will Perform Grooming: with supervision;Sitting, chair;Supported;with adaptive equipment ADL Goal: Grooming - Progress: Goal set today Pt Will Perform Upper Body Bathing: with supervision;Sitting, chair;Supported ADL Goal: Product manager - Progress: Goal set today Pt Will Perform Lower Body Bathing: with max assist;Sit to stand from bed ADL Goal: Lower Body Bathing - Progress: Goal set today Miscellaneous OT Goals Miscellaneous OT Goal #1: Pt will transition supine to sit EOB with mod assist in preparation for selfcare and grooming tasks. OT Goal: Miscellaneous Goal #1 - Progress: Goal set today  OT Evaluation Precautions/Restrictions  Precautions Precautions: Fall Precaution Comments: flexiseal Required Braces or Orthoses: No Restrictions Weight Bearing Restrictions: No RLE Weight Bearing: Non weight bearing LLE Weight Bearing: Non weight  bearing Other  Position/Activity Restrictions: pt doesn't have WBing Restrictions.  pt has been W/C bound for 29yrs per Mother.   Prior Functioning Home Living Lives With: Alone Receives Help From: Family;Personal care attendant Type of Home: Apartment Home Layout: One level Home Access: Level entry Bathroom Shower/Tub: Engineer, manufacturing systems: Standard Bathroom Accessibility: Yes How Accessible: Accessible via wheelchair Home Adaptive Equipment: Wheelchair - manual;Bedside commode/3-in-1;Wheelchair - powered Additional Comments: Pt had been living alone until last hospitalization when she D/C'd to [facility] for Rehab.   Prior Function Level of Independence: Needs assistance with ADLs;Needs assistance with homemaking;Requires assistive device for independence;Independent with transfers Bath: Minimal Driving: No Vocation: On disability ADL ADL Eating/Feeding: Simulated;Moderate assistance Where Assessed - Eating/Feeding: Bed level Grooming: Simulated;Minimal assistance Where Assessed - Grooming: Supine, head of bed up Upper Body Bathing: Simulated;Supervision/safety Where Assessed - Upper Body Bathing: Supine, head of bed up Lower Body Bathing: Simulated;+1 Total assistance Where Assessed - Lower Body Bathing: Supine, head of bed up;Rolling right and/or left Upper Body Dressing: Simulated;Moderate assistance Where Assessed - Upper Body Dressing: Supine, head of bed up;Rolling right and/or left Lower Body Dressing: Simulated;+1 Total assistance Where Assessed - Lower Body Dressing: Supine, head of bed up;Rolling right and/or left Toilet Transfer: Not assessed Toileting - Clothing Manipulation: +1 Total assistance Where Assessed - Toileting Clothing Manipulation: Rolling right and/or left Toileting - Hygiene: +1 Total assistance;Performed Toileting - Hygiene Details (indicate cue type and reason): Pt leaking around flexiseal.  Performed toilet hygiene in sidelying. Where Assessed -  Toileting Hygiene: Rolling right and/or left Tub/Shower Transfer: Not assessed Ambulation Related to ADLs: Not tested this session. ADL Comments: OT evaluation limited to bed level with rolling for peri cleanup.  Did not attempt sitting secondary to flexiseal leaking.  Performed rolling with mod demonstrational cueing and max assist to both the left and right sides. Vision/Perception  Vision - History Baseline Vision: No visual deficits Patient Visual Report: No change from baseline Vision - Assessment Vision Assessment: Vision not tested Cognition Cognition Arousal/Alertness: Awake/alert Overall Cognitive Status: Appears within functional limits for tasks assessed Orientation Level: Oriented X4 Sensation/Coordination Sensation Light Touch: Appears Intact (intact in bilateral UEs) Coordination Gross Motor Movements are Fluid and Coordinated: No Fine Motor Movements are Fluid and Coordinated: No Coordination and Movement Description: Pt with moderate ataxia in bilateral UEs. Extremity Assessment RUE Assessment RUE Assessment: Exceptions to [facility] RUE AROM (degrees) RUE Overall AROM Comments: Pt with strength at 3+/4 in shoulders and elbow flexion.  Moderate ataxia noted in the arm with finger to nose testing. LUE Assessment LUE Assessment: Exceptions to [facility] LUE AROM (degrees) LUE Overall AROM Comments: Overall strength 3+/5 in the shoulders and 4/5 in the elbow and grip.  Moderate ataxia with finger to nose testing. Mobility  Bed Mobility Bed Mobility: Yes Rolling Right: 2: Max assist;With rail Rolling Right Details (indicate cue type and reason): Mod demonstrational cueing for technique to roll. Rolling Left: 2: Max assist;With rail Rolling Left Details (indicate cue type and reason): Mod demeonstrational cueing for technique to roll. Sitting - Scoot to Edge of Bed: Not tested (comment) Transfers Transfers: No  End of Session OT - End of Session Activity Tolerance: Patient  tolerated treatment well Patient left: in bed;with call bell in reach;with family/visitor present General Behavior During Session: [facility] for tasks performed Cognition: [facility] for tasks performed   Terrius Gentile OTR/L 12/04/2011, 2:59 PM  Pager number [phone]

## 2011-12-04 NOTE — Progress Notes (Signed)
Clinical Social Worker met with pt and mother.  CSW offered emotional support. CSW confirmed plan for SNF at dc.  Pt and mother were adamant for pt not to return to [facility].  CSW submitted information to [facility].  CSW to review list tomorrow.    Angelia Mould, MSW, [place] [phone]

## 2011-12-04 NOTE — Progress Notes (Signed)
   12/04/11 Latavius Capizzi,RN,BSN 1000 PT NOW HAS ORDER FOR LTAC C/S AND MD STATES PT MAY DC IF ELIGIBLE.  SPOKE WITH PT AND FAMILY--THEY WISH TO GO TO [facility] IF AT ALL POSSIBLE.  NOTIFIED JENNY WITH [facility], SHE WILL EVALUATE PT UPON  ELIGIBILITY AND NOTIFY ME IF WE CAN ADMIT TODAY.  WILL NOTIFY BEDSIDE NURSE AND DR Molli Knock WHEN INFORMATION IS AVAILABLE.   Phone #[phone]   12/03/11 Quinteria Chisum,RN,BSN  PT FROM [facility] ADM WITH SEPSIS, CDIFF, RESP FAILURE.  CSW STATES PT AND FAMILY DO NOT WISH TO RETURN TO SNF.  MD CONSIDERING POSSIBLE LTAC TRANSFER.  WILL FOLLOW.  Phone #[phone]   11/28/11- 1555- Donn Pierini RN, BSN [phone] UR review completed

## 2011-12-04 NOTE — Progress Notes (Signed)
Pt transferred to [facility].  RN called report to [facility], Paramedic.

## 2011-12-04 NOTE — Discharge Summary (Signed)
Physician Discharge Summary  Patient ID: Michaela [name] MRN: [id number] DOB/AGE: [birth year] 56 y.o.  Admit date: 11/21/2011 Discharge date: 12/04/2011    Discharge Diagnoses:  Principal Problem:  *SIRS (systemic inflammatory response syndrome) Active Problems:  Mitral regurgitation due to cusp prolapse  S/P mitral valve repair  Brain tumor, astrocytoma  Neuromuscular disorder  Diastolic CHF, chronic  Diarrhea  Hypotension  Encephalopathy acute  Hyponatremia  Dehydration  Leukocytosis  Chest pain  Anemia  Atrial fibrillation with RVR  C. difficile colitis  Septic shock  Acute respiratory failure    Brief Summary: Michaela [name] is a 56 y.o. female SNF resident with hx cerebral astrocytoma at age 52 s/p resection, severe mitral regurgitation s/p MVR in February with course c/b UTI. She was admitted 3/14 by [facility] with CDiff colitis and hypotension, PCCM was consulted. Hypotension initially improved with fluid resuscitation and PCCM signed off. On 3/14 developed worsening hypotension, obtundation and resp failure and PCCM called back to assume ICU care.   Lines / Drains:  ETT 3/18>>> 3/22  Left IJ 3/18>>>  Rt fem line 3/18>>> 3/22   Cultures:  Urine 3/14>>> neg  BCx2 3/14>>>neg  CDiff 3/14>>> POS  Stool 3/14>>>neg  BCx2 3/18>>> neg  Urine 3/18>>> neg  Sputum 3/19>>> neg   Antibiotics:  Vanc (enema-Cdiff) 3/18>>> 3/22  Ceftaz (?asp PNA) 3/18>>>3/21  Ceftriaxone 3/21>> 3/23  Flagyl (CDiff) 3/14>>>  Vanc (oral - CDiff) 3/14>>>   Tests / Events:  3/14 resp failure, hypotension, intubated  3.18 CT abdo/pelvis -Prominent wall thickening throughout most of the colon, which certainly could be a secondary finding from C. difficile colitis. Bilateral small pleural effusions with bibasilar consolidation. Extensive subcutaneous and mesenteric edema, with ascites. .  3/18 No acute intracranial findings.  3/18 - ARDS  3/19 -off pressors  3/20 afib rvr, neg 4 liters   3/21- neg 1 lit, improved vent  3/20 echo - 55%, mitral appears good  3/21- neg 3 liters  Hospital Course -- Acute Respiratory failure - large edema component, abd distension and likely aspiration PNA, ARDS. Tolerating extubation well.  - Cont supportive mgmt.  - Hold to SDU overnight.  - Titrate O2 for sat of 92-95%.   CDiff colitis -- improved.  Continue oral vanc, flagyl, has responded to this.  If clinically cdiff worsen change to fodaximicin.   Aspiration PNA--  PLAN -  Clinically improved. CXR lagging.  Cont to monitor clinically and radiographically.  No indication for additional abx.   Hypotension/ septic shock - resolved. Treated with sepsis protocol, aggressive fluids, abx.   Encephalopathy -- In setting shock. resolved   Coagulopathy - in setting coumadin for Afib / sepsis  Hep per pharmacy  Continue coumadin per pharmacy.   Afib rvr - Cards following. PAF 3/22 PM. NSR now  - Amiodarone and Hep gtt per Cards.   Chronic diastolic CHF/ mitral regurg - Cards following. EF55-60% on echo 10/2011  - No further diuresis planned   Hyponatremia - resolved   Anemia --  No evidence of acute blood loss. But Hg did drop, will hold heparin and send stool OB.   Malnourished, severe  Diet ordered  Would cont dietitian f/u  Debilitation  -Acute on chronic. Presently moderate to severe. PT ordered. Suspect will need Rehab after discharge. [facility] and will d/c there upon bed availability.    Discharge Exam: General: chronically ill appearing, NAD  HEENT: mm dry, line clean  Neuro: follows commands, rass 0, course tremor (chronic)  Lungs: slight coarse  Cards: RRR s M  Abd: soft, NABS  Ext: warm and dry, no edema    Discharge Labs  BMET  Lab 12/04/11 0455 12/03/11 0430 12/02/11 0400 12/01/11 0515 11/30/11 0420 11/29/11 2320  NA 136 136 133* 134* 134* --  K 4.2 4.4 -- -- -- --  CL 102 102 99 98 96 --  CO2 31 31 30  32 32 --  GLUCOSE 92 93 92 92 80  --  BUN 15 12 11 10 7  --  CREATININE 0.39* 0.40* 0.39* 0.38* 0.38* --  CALCIUM 7.9* 8.0* 7.6* 8.0* 7.9* --  MG 2.1 2.0 -- -- 2.1 2.2  PHOS 2.8 2.8 -- -- 2.9 2.7     CBC   Lab 12/04/11 0455 12/03/11 0430 12/02/11 0400  HGB 7.4* 8.2* 8.3*  HCT 23.6* 26.2* 25.9*  WBC 15.9* 22.0* 23.5*  PLT 57* 51* 55*   Anti-Coagulation  Lab 12/04/11 0455 12/03/11 0430 12/01/11 0515 11/30/11 0420 11/29/11 0424  INR 1.32 1.25 1.35 1.55* 1.56*      Discharge Orders    Future Appointments: Provider: Department: Dept Phone: Center:   12/13/2011 9:15 AM [name], MD [facility] [phone] [facility]       Aundra Millet A  Discharge Medication Instructions AVW:[id number]   Printed on:12/04/11 1641  Medication Information                    amiodarone (PACERONE) 200 MG tablet Take 1 tablet (200 mg total) by mouth 2 (two) times daily.           famotidine (PEPCID) 20 MG tablet Take 1 tablet (20 mg total) by mouth daily.           levalbuterol (XOPENEX) 0.63 MG/3ML nebulizer solution Take 3 mLs (0.63 mg total) by nebulization every 6 (six) hours as needed for wheezing or shortness of breath.           metroNIDAZOLE (FLAGYL) 500 MG tablet Take 1 tablet (500 mg total) by mouth every 6 (six) hours.           ondansetron (ZOFRAN) 4 MG/2ML SOLN injection Inject 2 mLs (4 mg total) into the vein every 6 (six) hours as needed for nausea.           vancomycin (VANCOCIN) 50 mg/mL oral solution Take 10 mLs (500 mg total) by mouth every 6 (six) hours.           warfarin (COUMADIN) 3 MG tablet Take 1 tablet (3 mg total) by mouth one time only at 6 PM.              Disposition:  [facility]  Discharged Condition: Michaela [name] has met maximum benefit of inpatient care and is medically stable and cleared for discharge.  Patient is pending follow up as above.      Time spent on disposition:  Greater than 35 minutes.   SignedDanford Bad, NP 12/04/2011  4:34  PM Pager: [phone]  *Care during the described time interval was provided by me and/or other providers on the critical care team. I have reviewed this patient's available data, including medical history, events of note, physical examination and test results as part of my evaluation.  Patient seen and examined, agree with above note.  I dictated the care and orders written for this patient under my direction.  Koren Bound, M.D. [phone]

## 2011-12-05 LAB — PREALBUMIN: Prealbumin: 19.5 mg/dL (ref 17.0–34.0)

## 2011-12-05 LAB — CBC
MCV: 94.4 fL (ref 78.0–100.0)
Platelets: 87 10*3/uL — ABNORMAL LOW (ref 150–400)
RBC: 2.68 MIL/uL — ABNORMAL LOW (ref 3.87–5.11)
RDW: 27.1 % — ABNORMAL HIGH (ref 11.5–15.5)
WBC: 12.2 10*3/uL — ABNORMAL HIGH (ref 4.0–10.5)

## 2011-12-05 LAB — RENAL FUNCTION PANEL
Albumin: 1.9 g/dL — ABNORMAL LOW (ref 3.5–5.2)
Calcium: 8.1 mg/dL — ABNORMAL LOW (ref 8.4–10.5)
GFR calc Af Amer: >90 mL/min (ref >90)
GFR calc non Af Amer: >90 mL/min (ref >90)
Phosphorus: 2.8 mg/dL (ref 2.3–4.6)
Sodium: 134 mEq/L — ABNORMAL LOW (ref 135–145)

## 2011-12-05 LAB — C-REACTIVE PROTEIN: CRP: 0.61 mg/dL — ABNORMAL HIGH (ref <0.60)

## 2011-12-05 LAB — PROCALCITONIN: Procalcitonin: 0.13 ng/mL

## 2011-12-05 LAB — PROTIME-INR: INR: 1.29 (ref 0.00–1.49)

## 2011-12-05 LAB — SEDIMENTATION RATE: Sed Rate: 41 mm/hr — ABNORMAL HIGH (ref 0–22)

## 2011-12-06 LAB — OCCULT BLOOD X 1 CARD TO LAB, STOOL: Fecal Occult Bld: POSITIVE

## 2011-12-06 NOTE — Progress Notes (Signed)
   CARE MANAGEMENT NOTE 12/06/2011  Patient:  [name],[name]   Account Number:  [id number]  Date Initiated:  11/28/2011  Documentation initiated by:  Michaela [name]  Subjective/Objective Assessment:   Pt admitted from SNF with hypotension and C-diff - currently on vent with ARDS , asp PNA, ?septic shock     Action/Plan:   ORDER GIVEN 3/26 FOR LTAC CONSULT.   Anticipated DC Date:  12/04/2011   Anticipated DC Plan:  LONG TERM ACUTE CARE (LTAC)  In-house referral  Clinical Social Worker      DC Planning Services  CM consult      Choice offered to / List presented to:             Status of service:  Completed, signed off Medicare Important Message given?   (If response is "NO", the following Medicare IM given date fields will be blank) Date Medicare IM given:   Date Additional Medicare IM given:    Discharge Disposition:  LONG TERM ACUTE CARE (LTAC)  Per UR Regulation:    If discussed at Long Length of Stay Meetings, dates discussed:    Comments:  12/04/11 Michaela Corona,RN,BSN 1600 EVALUATION COMPLETE AND PT ELIGIBLE FOR DC TO [facility].  PT AND MOTHER AGREEABLE TO TRANSFER; JENNY WITH [facility] TO ARRANGE TRANSFER FOR THIS EVENING.  MD/PA TO COMPLETE DC SUMMARY.   12/04/11 Michaela Sarvis,RN,BSN 1000 PT NOW HAS ORDER FOR LTAC C/S AND MD STATES PT MAY DC IF ELIGIBLE.  SPOKE WITH PT AND FAMILY--THEY WISH TO GO TO [facility] IF AT ALL POSSIBLE.  NOTIFIED JENNY WITH [facility], SHE WILL EVALUATE PT UPON  ELIGIBILITY AND NOTIFY ME IF WE CAN ADMIT TODAY.  WILL NOTIFY BEDSIDE NURSE AND DR Molli Knock WHEN INFORMATION IS AVAILABLE. Phone #[phone]  12/03/11 Michaela Venturino,RN,BSN PT FROM [facility] ADM WITH SEPSIS, CDIFF, RESP FAILURE.  CSW STATES PT AND FAMILY DO NOT WISH TO RETURN TO SNF.  MD CONSIDERING POSSIBLE LTAC TRANSFER. WILL FOLLOW. Phone #[phone]   11/28/11- 1555- Michaela Pierini RN, BSN [phone] UR review completed

## 2011-12-07 LAB — CBC
HCT: 26.6 % — ABNORMAL LOW (ref 36.0–46.0)
MCH: 30.4 pg (ref 26.0–34.0)
MCHC: 31.2 g/dL (ref 30.0–36.0)
RDW: 28.8 % — ABNORMAL HIGH (ref 11.5–15.5)

## 2011-12-07 LAB — OCCULT BLOOD X 1 CARD TO LAB, STOOL: Fecal Occult Bld: NEGATIVE

## 2011-12-07 LAB — DIFFERENTIAL
Basophils Absolute: 0.1 10*3/uL (ref 0.0–0.1)
Basophils Relative: 1 % (ref 0–1)
Eosinophils Absolute: 0.1 10*3/uL (ref 0.0–0.7)
Monocytes Absolute: 1 10*3/uL (ref 0.1–1.0)
Neutro Abs: 7.9 10*3/uL — ABNORMAL HIGH (ref 1.7–7.7)

## 2011-12-07 LAB — BASIC METABOLIC PANEL
BUN: 11 mg/dL (ref 6–23)
Calcium: 8 mg/dL — ABNORMAL LOW (ref 8.4–10.5)
Chloride: 103 mEq/L (ref 96–112)
Creatinine, Ser: 0.41 mg/dL — ABNORMAL LOW (ref 0.50–1.10)
GFR calc Af Amer: >90 mL/min (ref >90)
GFR calc non Af Amer: >90 mL/min (ref >90)

## 2011-12-08 LAB — CBC
Hemoglobin: 8.2 g/dL — ABNORMAL LOW (ref 12.0–15.0)
MCH: 30.4 pg (ref 26.0–34.0)
MCHC: 31.2 g/dL (ref 30.0–36.0)
Platelets: 110 10*3/uL — ABNORMAL LOW (ref 150–400)
RDW: 28.6 % — ABNORMAL HIGH (ref 11.5–15.5)

## 2011-12-09 LAB — CBC
Platelets: 130 10*3/uL — ABNORMAL LOW (ref 150–400)
RBC: 2.76 MIL/uL — ABNORMAL LOW (ref 3.87–5.11)
RDW: 27.9 % — ABNORMAL HIGH (ref 11.5–15.5)
WBC: 8.6 10*3/uL (ref 4.0–10.5)

## 2011-12-09 LAB — TYPE AND SCREEN
Donor AG Type: NEGATIVE
Donor AG Type: NEGATIVE
Unit division: 0

## 2011-12-09 LAB — BASIC METABOLIC PANEL
CO2: 26 mEq/L (ref 19–32)
Chloride: 105 mEq/L (ref 96–112)
Creatinine, Ser: 0.36 mg/dL — ABNORMAL LOW (ref 0.50–1.10)
GFR calc Af Amer: >90 mL/min (ref >90)
Potassium: 4.3 mEq/L (ref 3.5–5.1)
Sodium: 137 mEq/L (ref 135–145)

## 2011-12-11 ENCOUNTER — Other Ambulatory Visit (HOSPITAL_COMMUNITY): Payer: Self-pay

## 2011-12-11 DIAGNOSIS — R197 Diarrhea, unspecified: Secondary | ICD-10-CM | POA: Diagnosis not present

## 2011-12-11 DIAGNOSIS — R1110 Vomiting, unspecified: Secondary | ICD-10-CM | POA: Diagnosis not present

## 2011-12-11 DIAGNOSIS — J90 Pleural effusion, not elsewhere classified: Secondary | ICD-10-CM | POA: Diagnosis not present

## 2011-12-12 LAB — BASIC METABOLIC PANEL
BUN: 16 mg/dL (ref 6–23)
CO2: 29 mEq/L (ref 19–32)
Chloride: 102 mEq/L (ref 96–112)
GFR calc Af Amer: >90 mL/min (ref >90)
Potassium: 4.1 mEq/L (ref 3.5–5.1)

## 2011-12-12 LAB — CBC
HCT: 30.6 % — ABNORMAL LOW (ref 36.0–46.0)
MCV: 99 fL (ref 78.0–100.0)
RBC: 3.09 MIL/uL — ABNORMAL LOW (ref 3.87–5.11)
RDW: 25.7 % — ABNORMAL HIGH (ref 11.5–15.5)
WBC: 8 10*3/uL (ref 4.0–10.5)

## 2011-12-12 LAB — DIFFERENTIAL
Basophils Absolute: 0.1 10*3/uL (ref 0.0–0.1)
Eosinophils Relative: 2 % (ref 0–5)
Lymphocytes Relative: 16 % (ref 12–46)
Lymphs Abs: 1.3 10*3/uL (ref 0.7–4.0)
Monocytes Relative: 11 % (ref 3–12)
Neutro Abs: 5.5 10*3/uL (ref 1.7–7.7)

## 2011-12-13 ENCOUNTER — Ambulatory Visit: Payer: Medicare Other | Admitting: Cardiology

## 2011-12-15 LAB — BASIC METABOLIC PANEL
CO2: 29 mEq/L (ref 19–32)
Chloride: 103 mEq/L (ref 96–112)
Glucose, Bld: 97 mg/dL (ref 70–99)
Sodium: 139 mEq/L (ref 135–145)

## 2011-12-15 LAB — MAGNESIUM: Magnesium: 2 mg/dL (ref 1.5–2.5)

## 2011-12-17 LAB — DIGOXIN LEVEL: Digoxin Level: 1.7 ng/mL (ref 0.8–2.0)

## 2011-12-18 ENCOUNTER — Other Ambulatory Visit (HOSPITAL_COMMUNITY): Payer: Self-pay

## 2011-12-18 DIAGNOSIS — R509 Fever, unspecified: Secondary | ICD-10-CM | POA: Diagnosis not present

## 2011-12-18 DIAGNOSIS — R05 Cough: Secondary | ICD-10-CM | POA: Diagnosis not present

## 2011-12-18 LAB — URINALYSIS, ROUTINE W REFLEX MICROSCOPIC
Hgb urine dipstick: NEGATIVE
Nitrite: NEGATIVE
Specific Gravity, Urine: 1.031 — ABNORMAL HIGH (ref 1.005–1.030)
pH: 5.5 (ref 5.0–8.0)

## 2011-12-18 LAB — BASIC METABOLIC PANEL
BUN: 18 mg/dL (ref 6–23)
Chloride: 97 mEq/L (ref 96–112)
GFR calc Af Amer: >90 mL/min (ref >90)
Glucose, Bld: 102 mg/dL — ABNORMAL HIGH (ref 70–99)
Potassium: 3.4 mEq/L — ABNORMAL LOW (ref 3.5–5.1)

## 2011-12-18 LAB — CBC
HCT: 33.3 % — ABNORMAL LOW (ref 36.0–46.0)
Hemoglobin: 10.3 g/dL — ABNORMAL LOW (ref 12.0–15.0)
WBC: 16.2 10*3/uL — ABNORMAL HIGH (ref 4.0–10.5)

## 2011-12-18 LAB — URINE MICROSCOPIC-ADD ON

## 2011-12-18 LAB — CLOSTRIDIUM DIFFICILE BY PCR: Toxigenic C. Difficile by PCR: POSITIVE — AB

## 2011-12-19 DIAGNOSIS — A0472 Enterocolitis due to Clostridium difficile, not specified as recurrent: Secondary | ICD-10-CM

## 2011-12-19 LAB — DIFFERENTIAL
Basophils Relative: 0 % (ref 0–1)
Eosinophils Relative: 1 % (ref 0–5)
Lymphocytes Relative: 9 % — ABNORMAL LOW (ref 12–46)
Monocytes Absolute: 1.3 10*3/uL — ABNORMAL HIGH (ref 0.1–1.0)
Monocytes Relative: 12 % (ref 3–12)
Neutrophils Relative %: 78 % — ABNORMAL HIGH (ref 43–77)
WBC Morphology: INCREASED

## 2011-12-19 LAB — CBC
Hemoglobin: 9.9 g/dL — ABNORMAL LOW (ref 12.0–15.0)
MCH: 28.8 pg (ref 26.0–34.0)
Platelets: 126 10*3/uL — ABNORMAL LOW (ref 150–400)
RBC: 3.44 MIL/uL — ABNORMAL LOW (ref 3.87–5.11)
WBC: 11 10*3/uL — ABNORMAL HIGH (ref 4.0–10.5)

## 2011-12-19 LAB — PROCALCITONIN: Procalcitonin: 0.29 ng/mL

## 2011-12-19 LAB — DIGOXIN LEVEL: Digoxin Level: 1.5 ng/mL (ref 0.8–2.0)

## 2011-12-19 NOTE — Consult Note (Signed)
Date of Admission:  12/04/2011  Date of Consult:  12/19/2011  Reason for Consult: recurrent clostridium difficile colitis Referring Physician: Dr. Christella Hartigan   HPI: Michaela [name] is an 56 y.o. 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 Patient was subsequently discharged to [facility] postop she developed shortness of breath found to have a large pleural effusion underwent thoracentesis and diuretic treatment with resolution of her dyspnea. Patient was subsequently placed on Levaquin on 11/13/2011 due to a rise in the white count and right-sided infiltrate on chest x-ray. She developed profuse watery black diarrhea and was admitted [facility] with sepsis physiology transferred to the ICU. She was found to have clostridium difficile colitis. She was rx with oral vancomycin, vancomycin enemas as well as flagyl and also had been receiving antibacterial abx  (rocephin and ceftazidime) for pneumonia through the 23rd. She was dc from [facility] to [facility] on the 27th. She has since developed profuse diarrhea and has recurrence of her c difficile colitis now on vancomycin orally.    Past Medical History  Diagnosis Date  . 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 MVP (mitral valve prolapse)   . Mitral regurgitation   . Shortness of breath   . Heart murmur   . Neuromuscular disorder     numbness in hands and feet  . Arthritis   . Overactive bladder   . Pleural effusion, left 11/11/2011    Past Surgical History  Procedure Date  . Ventriculoperitoneal shunt 1971  . Knee arthroscopy     right  . Cardiac catheterization     Jan 2013  . Transesophageal echocardiogram 7/12  . US echocardiography 6/12  . Mitral valve repair 10/15/2011   Procedure: MINIMALLY INVASIVE MITRAL VALVE REPAIR (MVR);  Surgeon: Purcell Nails, MD;  Location: [facility] OR;  Service: Open Heart Surgery;  Laterality: Right;  ergies:   Allergies  Allergen Reactions  . Compazine Other (See Comments)    "eyes get buggy"     Medications: I have reviewed patients current medications as documented in [place] Anti-infectives    None      Social History:  reports that she quit smoking about 21 years ago. She does not have any smokeless tobacco history on file. She reports that she drinks alcohol. She reports that she does not use illicit drugs.  No family history on file.  As in HPI and primary teams notes otherwise 12 point review of systems is negative  There were no vitals taken for this visit. General: Alert and awake, oriented x3, frail, thin HEENT: anicteric sclera, pupils reactive to light and accommodation, EOMI, oropharynx clear and without exudate CVStachycardic rate, normal r,  no murmur rubs or gallops Chest: clear to auscultation bilaterally, no wheezing, rales or rhonchi Abdomen: tender, slightly distended ormal bowel sounds, Extremities: no  clubbing or edema noted bilaterally Skin: no rashes Neuro: nonfocal, strength and sensation intact   Results for orders placed during the hospital encounter of 12/04/11 (from the past 48 hour(s))  CLOSTRIDIUM DIFFICILE BY PCR     Status: Abnormal   Collection Time   12/17/11  8:46 PM      Component Value Range Comment   C difficile by pcr POSITIVE (*) NEGATIVE    CBC     Status:  Abnormal   Collection Time   12/18/11  6:30 AM      Component Value Range Comment   WBC 16.2 (*) 4.0 - 10.5 (K/uL)    RBC 3.46 (*) 3.87 - 5.11 (MIL/uL)    Hemoglobin 10.3 (*) 12.0 - 15.0 (g/dL)    HCT 16.1 (*) 09.6 - 46.0 (%)    MCV 96.2  78.0 - 100.0 (fL)    MCH 29.8  26.0 - 34.0 (pg)    MCHC 30.9  30.0 - 36.0 (g/dL)    RDW 04.5 (*) 40.9 - 15.5 (%)    Platelets 125 (*) 150 - 400 (K/uL)   BASIC METABOLIC PANEL      Status: Abnormal   Collection Time   12/18/11  6:30 AM      Component Value Range Comment   Sodium 134 (*) 135 - 145 (mEq/L)    Potassium 3.4 (*) 3.5 - 5.1 (mEq/L)    Chloride 97  96 - 112 (mEq/L)    CO2 27  19 - 32 (mEq/L)    Glucose, Bld 102 (*) 70 - 99 (mg/dL)    BUN 18  6 - 23 (mg/dL)    Creatinine, Ser 8.11 (*) 0.50 - 1.10 (mg/dL)    Calcium 8.6  8.4 - 10.5 (mg/dL)    GFR calc non Af Amer >90  >90 (mL/min)    GFR calc Af Amer >90  >90 (mL/min)   URINALYSIS, ROUTINE W REFLEX MICROSCOPIC     Status: Abnormal   Collection Time   12/18/11 11:25 AM      Component Value Range Comment   Color, Urine AMBER (*) YELLOW  BIOCHEMICALS MAY BE AFFECTED BY COLOR   APPearance CLOUDY (*) CLEAR     Specific Gravity, Urine 1.031 (*) 1.005 - 1.030     pH 5.5  5.0 - 8.0     Glucose, UA NEGATIVE  NEGATIVE (mg/dL)    Hgb urine dipstick NEGATIVE  NEGATIVE     Bilirubin Urine SMALL (*) NEGATIVE     Ketones, ur NEGATIVE  NEGATIVE (mg/dL)    Protein, ur 30 (*) NEGATIVE (mg/dL)    Urobilinogen, UA 0.2  0.0 - 1.0 (mg/dL)    Nitrite NEGATIVE  NEGATIVE     Leukocytes, UA SMALL (*) NEGATIVE    URINE CULTURE     Status: Normal (Preliminary result)   Collection Time   12/18/11 11:25 AM      Component Value Range Comment   Specimen Description URINE, RANDOM      Special Requests NONE      Culture  Setup Time [id number]      Colony Count PENDING      Culture Culture reincubated for better growth      Report Status PENDING     URINE MICROSCOPIC-ADD ON     Status: Abnormal   Collection Time   12/18/11 11:25 AM      Component Value Range Comment   WBC, UA 7-10  <3 (WBC/hpf)    Bacteria, UA FEW (*) RARE     Crystals CA OXALATE CRYSTALS (*) NEGATIVE    CBC     Status: Abnormal   Collection Time   12/19/11  6:55 AM      Component Value Range Comment   WBC 11.0 (*) 4.0 - 10.5 (K/uL)    RBC 3.44 (*) 3.87 - 5.11 (MIL/uL)    Hemoglobin 9.9 (*) 12.0 - 15.0 (g/dL)    HCT 08.6 (*) 57.8 - 46.0 (%)  MCV 94.5   78.0 - 100.0 (fL)    MCH 28.8  26.0 - 34.0 (pg)    MCHC 30.5  30.0 - 36.0 (g/dL)    RDW 54.0 (*) 98.1 - 15.5 (%)    Platelets 126 (*) 150 - 400 (K/uL)   DIFFERENTIAL     Status: Abnormal   Collection Time   12/19/11  6:55 AM      Component Value Range Comment   Neutrophils Relative 78 (*) 43 - 77 (%)    Lymphocytes Relative 9 (*) 12 - 46 (%)    Monocytes Relative 12  3 - 12 (%)    Eosinophils Relative 1  0 - 5 (%)    Basophils Relative 0  0 - 1 (%)    Neutro Abs 8.6 (*) 1.7 - 7.7 (K/uL)    Lymphs Abs 1.0  0.7 - 4.0 (K/uL)    Monocytes Absolute 1.3 (*) 0.1 - 1.0 (K/uL)    Eosinophils Absolute 0.1  0.0 - 0.7 (K/uL)    Basophils Absolute 0.0  0.0 - 0.1 (K/uL)    RBC Morphology POLYCHROMASIA PRESENT      WBC Morphology INCREASED BANDS (>20% BANDS)     PROCALCITONIN     Status: Normal   Collection Time   12/19/11  6:55 AM      Component Value Range Comment   Procalcitonin 0.29     DIGOXIN LEVEL     Status: Normal   Collection Time   12/19/11  6:55 AM      Component Value Range Comment   Digoxin Level 1.5  0.8 - 2.0 (ng/mL)       Component Value Date/Time   SDES URINE, RANDOM 12/18/2011 1125   SPECREQUEST NONE 12/18/2011 1125   CULT Culture reincubated for better growth 12/18/2011 1125   REPTSTATUS PENDING 12/18/2011 1125   Dg Chest Port 1 View  12/18/2011  *RADIOLOGY REPORT*  Clinical Data: Cough and low grade fever.  PORTABLE CHEST - 1 VIEW  Comparison: 12/01/2011.  Findings: The cardiac silhouette, mediastinal and hilar contours are within normal limits and stable.  Stable surgical changes from mitral valve replacement surgery.  The lungs are much better aerated and lung volumes are adequate.  No definite infiltrates or effusions.  No pneumothorax.  The ventriculoperitoneal catheter is stable.  IMPRESSION: Much improved lung aeration when compared to multiple prior chest films.  No definite infiltrates or effusions.  Original Report Authenticated By: P. Loralie Champagne, M.D.      Recent Results (from the past 720 hour(s))  CULTURE, BLOOD (ROUTINE X 2)     Status: Normal   Collection Time   11/21/11  1:00 PM      Component Value Range Status Comment   Specimen Description BLOOD HAND RIGHT   Final    Special Requests BOTTLES DRAWN AEROBIC AND ANAEROBIC 5CC   Final    Culture  Setup Time [id number]   Final    Culture NO GROWTH 5 DAYS   Final    Report Status 11/27/2011 FINAL   Final   CULTURE, BLOOD (ROUTINE X 2)     Status: Normal   Collection Time   11/21/11  1:09 PM      Component Value Range Status Comment   Specimen Description BLOOD ARM RIGHT   Final    Special Requests BOTTLES DRAWN AEROBIC ONLY 4CC   Final    Culture  Setup Time [id number]   Final    Culture NO GROWTH 5 DAYS  Final    Report Status 11/27/2011 FINAL   Final   STOOL CULTURE     Status: Normal   Collection Time   11/21/11  4:06 PM      Component Value Range Status Comment   Specimen Description STOOL   Final    Special Requests NONE   Final    Culture     Final    Value: NO SALMONELLA, SHIGELLA, CAMPYLOBACTER, OR YERSINIA ISOLATED     Note: REDUCED NORMAL FLORA PRESENT   Report Status 11/25/2011 FINAL   Final   CLOSTRIDIUM DIFFICILE BY PCR     Status: Abnormal   Collection Time   11/21/11  4:06 PM      Component Value Range Status Comment   C difficile by pcr POSITIVE (*) NEGATIVE  Final   URINE CULTURE     Status: Normal   Collection Time   11/21/11  4:50 PM      Component Value Range Status Comment   Specimen Description URINE, CATHETERIZED   Final    Special Requests NONE   Final    Culture  Setup Time [id number]   Final    Colony Count NO GROWTH   Final    Culture NO GROWTH   Final    Report Status 11/23/2011 FINAL   Final   MRSA PCR SCREENING     Status: Normal   Collection Time   11/21/11 10:27 PM      Component Value Range Status Comment   MRSA by PCR NEGATIVE  NEGATIVE  Final   URINE CULTURE     Status: Normal   Collection Time   11/25/11  1:06 PM       Component Value Range Status Comment   Specimen Description URINE, CATHETERIZED   Final    Special Requests NONE   Final    Culture  Setup Time [id number]   Final    Colony Count NO GROWTH   Final    Culture NO GROWTH   Final    Report Status 11/26/2011 FINAL   Final   CULTURE, BLOOD (ROUTINE X 2)     Status: Normal   Collection Time   11/25/11  3:20 PM      Component Value Range Status Comment   Specimen Description BLOOD   Final    Special Requests     Final    Value: BOTTLES DRAWN AEROBIC AND ANAEROBIC 10CC LEFT DISTAL IJ   Culture  Setup Time [id number]   Final    Culture NO GROWTH 5 DAYS   Final    Report Status 12/01/2011 FINAL   Final   CULTURE, RESPIRATORY     Status: Normal   Collection Time   11/26/11 11:14 AM      Component Value Range Status Comment   Specimen Description ENDOTRACHEAL   Final    Special Requests NONE   Final    Gram Stain     Final    Value: FEW WBC PRESENT,BOTH PMN AND MONONUCLEAR     RARE SQUAMOUS EPITHELIAL CELLS PRESENT     NO ORGANISMS SEEN   Culture NO GROWTH 2 DAYS   Final    Report Status 11/28/2011 FINAL   Final   CLOSTRIDIUM DIFFICILE BY PCR     Status: Abnormal   Collection Time   12/17/11  8:46 PM      Component Value Range Status Comment   C difficile by pcr POSITIVE (*) NEGATIVE  Final   URINE CULTURE  Status: Normal (Preliminary result)   Collection Time   12/18/11 11:25 AM      Component Value Range Status Comment   Specimen Description URINE, RANDOM   Final    Special Requests NONE   Final    Culture  Setup Time [id number]   Final    Colony Count PENDING   Incomplete    Culture Culture reincubated for better growth   Final    Report Status PENDING   Incomplete      Impression/Recommendation  56 year old with recurrent c difficile colitis:  --treat with 14 days of oral vancomycin as you are doing --avoid ALL other antibiotics --dc PPI and avoid acid suppression if possible (use pepcid if needed) --give  florastor   Thank you so much for this interesting consult,   I will sign off for now.   Please call with further questions  Acey Lav 12/19/2011, 2:42 PM   [phone] (pager) [phone] (office)

## 2011-12-20 LAB — BASIC METABOLIC PANEL
Calcium: 8.6 mg/dL (ref 8.4–10.5)
GFR calc Af Amer: >90 mL/min (ref >90)
GFR calc non Af Amer: >90 mL/min (ref >90)
Potassium: 3.7 mEq/L (ref 3.5–5.1)
Sodium: 138 mEq/L (ref 135–145)

## 2011-12-22 LAB — DIFFERENTIAL
Basophils Absolute: 0.1 10*3/uL (ref 0.0–0.1)
Basophils Relative: 1 % (ref 0–1)
Eosinophils Absolute: 0.1 10*3/uL (ref 0.0–0.7)
Lymphocytes Relative: 14 % (ref 12–46)
Monocytes Relative: 11 % (ref 3–12)
Neutro Abs: 8.1 10*3/uL — ABNORMAL HIGH (ref 1.7–7.7)
Neutrophils Relative %: 73 % (ref 43–77)
Smear Review: ADEQUATE

## 2011-12-22 LAB — CBC
HCT: 31.2 % — ABNORMAL LOW (ref 36.0–46.0)
Hemoglobin: 9.7 g/dL — ABNORMAL LOW (ref 12.0–15.0)
MCHC: 31.1 g/dL (ref 30.0–36.0)
RDW: 21.6 % — ABNORMAL HIGH (ref 11.5–15.5)
WBC: 11.1 10*3/uL — ABNORMAL HIGH (ref 4.0–10.5)

## 2011-12-22 LAB — BASIC METABOLIC PANEL
BUN: 11 mg/dL (ref 6–23)
Chloride: 102 mEq/L (ref 96–112)
GFR calc Af Amer: >90 mL/min (ref >90)
GFR calc non Af Amer: >90 mL/min (ref >90)
Potassium: 4.2 mEq/L (ref 3.5–5.1)
Sodium: 140 mEq/L (ref 135–145)

## 2011-12-23 LAB — URINE CULTURE

## 2011-12-24 DIAGNOSIS — A414 Sepsis due to anaerobes: Secondary | ICD-10-CM | POA: Diagnosis not present

## 2011-12-24 DIAGNOSIS — E860 Dehydration: Secondary | ICD-10-CM | POA: Diagnosis not present

## 2011-12-24 DIAGNOSIS — D432 Neoplasm of uncertain behavior of brain, unspecified: Secondary | ICD-10-CM | POA: Diagnosis not present

## 2011-12-24 DIAGNOSIS — A0472 Enterocolitis due to Clostridium difficile, not specified as recurrent: Secondary | ICD-10-CM | POA: Diagnosis not present

## 2011-12-24 DIAGNOSIS — D649 Anemia, unspecified: Secondary | ICD-10-CM | POA: Diagnosis not present

## 2011-12-24 DIAGNOSIS — R5381 Other malaise: Secondary | ICD-10-CM | POA: Diagnosis not present

## 2011-12-24 DIAGNOSIS — J189 Pneumonia, unspecified organism: Secondary | ICD-10-CM | POA: Diagnosis not present

## 2011-12-24 DIAGNOSIS — Z9889 Other specified postprocedural states: Secondary | ICD-10-CM | POA: Diagnosis not present

## 2011-12-24 DIAGNOSIS — E441 Mild protein-calorie malnutrition: Secondary | ICD-10-CM | POA: Diagnosis not present

## 2011-12-24 DIAGNOSIS — A09 Infectious gastroenteritis and colitis, unspecified: Secondary | ICD-10-CM | POA: Diagnosis not present

## 2011-12-24 DIAGNOSIS — D509 Iron deficiency anemia, unspecified: Secondary | ICD-10-CM | POA: Diagnosis not present

## 2011-12-24 DIAGNOSIS — D696 Thrombocytopenia, unspecified: Secondary | ICD-10-CM | POA: Diagnosis not present

## 2011-12-24 DIAGNOSIS — Z79899 Other long term (current) drug therapy: Secondary | ICD-10-CM | POA: Diagnosis not present

## 2011-12-24 DIAGNOSIS — Z792 Long term (current) use of antibiotics: Secondary | ICD-10-CM | POA: Diagnosis not present

## 2011-12-24 DIAGNOSIS — I4891 Unspecified atrial fibrillation: Secondary | ICD-10-CM | POA: Diagnosis not present

## 2011-12-24 DIAGNOSIS — M6281 Muscle weakness (generalized): Secondary | ICD-10-CM | POA: Diagnosis not present

## 2011-12-24 DIAGNOSIS — I10 Essential (primary) hypertension: Secondary | ICD-10-CM | POA: Diagnosis not present

## 2011-12-24 DIAGNOSIS — Z5189 Encounter for other specified aftercare: Secondary | ICD-10-CM | POA: Diagnosis not present

## 2011-12-24 DIAGNOSIS — R279 Unspecified lack of coordination: Secondary | ICD-10-CM | POA: Diagnosis not present

## 2011-12-24 DIAGNOSIS — I059 Rheumatic mitral valve disease, unspecified: Secondary | ICD-10-CM | POA: Diagnosis not present

## 2011-12-24 LAB — BASIC METABOLIC PANEL
BUN: 13 mg/dL (ref 6–23)
Creatinine, Ser: 0.45 mg/dL — ABNORMAL LOW (ref 0.50–1.10)
GFR calc Af Amer: >90 mL/min (ref >90)
GFR calc non Af Amer: >90 mL/min (ref >90)

## 2011-12-24 LAB — DIGOXIN LEVEL: Digoxin Level: 2.2 ng/mL — ABNORMAL HIGH (ref 0.8–2.0)

## 2011-12-30 ENCOUNTER — Telehealth: Payer: Self-pay | Admitting: Cardiology

## 2011-12-30 NOTE — Telephone Encounter (Signed)
Spoke with pt's mother. Appt made 01/09/12.

## 2011-12-30 NOTE — Telephone Encounter (Signed)
New Problem:     Patient's mother called in wanting to make an appointment for her daughter to see Dr. Shirlee Latch within the next two weeks and did not wish to see any of our PA's.  Please call back.

## 2012-01-02 ENCOUNTER — Telehealth: Payer: Self-pay | Admitting: *Deleted

## 2012-01-02 NOTE — Telephone Encounter (Signed)
[name], MD More Detail >>      [name], MD        Sent: Thu January 02, 2012 11:00 AM    To: [facility]    Cc: Jacqlyn Krauss, RN        Michaela [name]    MRN: [id number] DOB: [birth year]     Pt Home: [phone]               Message     I was contacted yesterday by this patient's sister Michaela [name]).  She is currently in a nursing facility but should be near ready to come home.  Could you please give Michaela [name] a call today at [phone] to see what help she needs?They have also been having a hard time getting a new patient appointment with Dr. Felicity Coyer at [facility]  Could you see if you can facilitate that? Ask her to see her sooner as a favor to me.  Thanks. For your help.  [place]   I called and spoke with Michaela [name] today. The issue is that the patient has multiple medical problems that need to be addressed/ managed by a PCP. She had CABG in February and went from [facility] to [facility] for rehab. She become volume overloaded and developed c-diff. She was admitted back to [facility] CCU then step down, the "inpatient" specialty hospital. She is now at [facility] and due to be released to home on Friday. She needs an appointment sooner rather than later to manage her PCP issues. Since she has dual Medicare/ Medicaid, they are having a hard time getting her in with someone. She lives closer to the [place] office. I explained I would call there to see if we could get her in. They are flexible with where she is seen as long as she has someone. Sherri Rad, RN, BSN   I Environmental consultant at [employer]. The only person taking Medicare is the Nurse Practitioner. They will not file her Medicaid at all. I called [place] at [facility] and the only person taking dual Medicare / Medicaid is Dr. Felicity Coyer, as long a Medicare is filed as primary. Unfortunately she has no openings for these patients until October. I was told by the schedulers Dr. Shirlee Latch would need to contact Dr. Felicity Coyer to see if a  sooner appointment can be facilitated. I will forward this message to him and his nurse, Thurston Hole, to see if he can speak with her. I have notified the patient's sister, Michaela [name], of what is going on. She is aware and very grateful for the help. I explained that hopefully Thurston Hole can call her back next week and let her know where things stand with this. She is agreeable. Sherri Rad, RN, BSN

## 2012-01-06 NOTE — Telephone Encounter (Signed)
Spoke with Michaela [name] at Dr [name] office. Per Dr Diamantina Monks office pt has [employer]. If she does have [place] [facility] pt is required to see her PCP assigned to her. This can only be changed by [facility].  Spoke with pt's sister Selena Batten. Selena Batten is going to talk with pt's social worker and call me back to let me know if she will be able to schedule an appt with Dr Felicity Coyer.

## 2012-01-07 ENCOUNTER — Telehealth: Payer: Self-pay | Admitting: Internal Medicine

## 2012-01-07 NOTE — Telephone Encounter (Signed)
Spoke with pt's sister, Selena Batten. Pt will have medicaid and not [place] [employer] on May 1,2013. [name] from Dr Diamantina Monks office will call Selena Batten and arrange an appt for pt with Dr Felicity Coyer.

## 2012-01-07 NOTE — Telephone Encounter (Signed)
Message copied by Etheleen Sia on Tue Jan 07, 2012  2:45 PM ------      Message from: Rene Paci A      Created: Mon Jan 06, 2012  2:44 PM      Regarding: RE: NEW PT       I will see her if she has new card at time of the appt verifying she is not [place] access            Ok to schedule for mid May            ----- Message -----         From: Etheleen Sia         Sent: 01/06/2012   1:19 PM           To: Newt Lukes, MD      Subject: NEW PT                                                   Margorie John, Dr. Alford Highland nurse called back.  She states that Aundra Millet sister called to tell them Ms. Dsouza will have [employer] Medicaid starting on May 1instead of [place] [employer].  [employer] is Primary.             Dr. Shirlee Latch wants to know if you will accept her as a patient and if you can work her in in the next 2-3 weeks.

## 2012-01-09 ENCOUNTER — Ambulatory Visit (INDEPENDENT_AMBULATORY_CARE_PROVIDER_SITE_OTHER): Payer: Medicare Other | Admitting: Cardiology

## 2012-01-09 ENCOUNTER — Encounter: Payer: Self-pay | Admitting: Cardiology

## 2012-01-09 VITALS — BP 110/66 | HR 79 | Resp 18 | Ht <= 58 in | Wt 81.0 lb

## 2012-01-09 DIAGNOSIS — A0472 Enterocolitis due to Clostridium difficile, not specified as recurrent: Secondary | ICD-10-CM | POA: Diagnosis not present

## 2012-01-09 DIAGNOSIS — I5032 Chronic diastolic (congestive) heart failure: Secondary | ICD-10-CM

## 2012-01-09 DIAGNOSIS — I340 Nonrheumatic mitral (valve) insufficiency: Secondary | ICD-10-CM

## 2012-01-09 DIAGNOSIS — I4891 Unspecified atrial fibrillation: Secondary | ICD-10-CM | POA: Diagnosis not present

## 2012-01-09 DIAGNOSIS — D432 Neoplasm of uncertain behavior of brain, unspecified: Secondary | ICD-10-CM | POA: Diagnosis not present

## 2012-01-09 DIAGNOSIS — I509 Heart failure, unspecified: Secondary | ICD-10-CM

## 2012-01-09 DIAGNOSIS — D649 Anemia, unspecified: Secondary | ICD-10-CM | POA: Diagnosis not present

## 2012-01-09 DIAGNOSIS — I059 Rheumatic mitral valve disease, unspecified: Secondary | ICD-10-CM

## 2012-01-09 MED ORDER — METOPROLOL SUCCINATE 12.5 MG HALF TABLET: 12.5000 mg | ORAL_TABLET | Freq: Every day | ORAL | Status: DC

## 2012-01-09 MED ORDER — AMIODARONE HCL 100 MG PO TABS: 100.0000 mg | ORAL_TABLET | Freq: Every day | ORAL | Status: DC

## 2012-01-09 MED ORDER — AMIODARONE HCL 100 MG PO TABS: 100.0000 mg | ORAL_TABLET | Freq: Two times a day (BID) | ORAL | Status: DC

## 2012-01-09 NOTE — Patient Instructions (Signed)
Your physician recommends that you return for lab work in: 1 month. Same day as appt with Dr. Shirlee Latch   BMP,TSH, LFT  Your physician recommends that you schedule a follow-up appointment in: 1 month with Dr. Shirlee Latch  Your physician has recommended you make the following change in your medication:  stop digoxin    Decrease Amiodarone to 100mg  daily Start Toprol XL 12.5 mg daily

## 2012-01-10 DIAGNOSIS — I4891 Unspecified atrial fibrillation: Secondary | ICD-10-CM | POA: Insufficient documentation

## 2012-01-10 NOTE — Assessment & Plan Note (Signed)
Volume status looks good today.  Do not think she needs to restart Lasix.  

## 2012-01-10 NOTE — Progress Notes (Signed)
PCP: Dr. Felicity Coyer  56 yo with history of cerebral astrocytoma s/p resection with residual ataxia and spasticity as well as mitral regurgitation returns for cardiology followup.  At baseline, patient has poor balance from ataxia and can walk limited distances with a walker but typically uses her wheelchair.  She had been living independently with help from family.  TTE in 2012 suggested significant mitral regurgitation.  I did a TEE to more closely assess the valve in 7/12.  This confirmed severe MR with bileaflet prolapse and EF 60%.  I started her on Lasix 20 mg daily (later increased to 20 mg bid), and she reports significant improvement in breathing.  She underwent minimally invasive mitral valve repair in 2/13 with Alfieri edge to edge repair and annuloplasty.  Echo post-op showed normal EF, no MR, and mild to moderate mitral stenosis.  She was discharged to [facility] nursing home.  She developed dyspnea after discharge and was found to have a large left pleural effusion.  She underwent thoracentesis and Lasix was increased, with resolution of dyspnea.  She then developed severe C difficile colitis and was admitted again to [facility] in 3/13.  She was hypotensive requiring pressors.  She had probable aspiration with intubation and development of an ARDS-like picture.  She also went into atrial fibrillation with RVR.  After a prolonged course in the hospital, she went to [facility] for a period and is now at [facility].    She is now in NSR on amiodarone.  She still has 1-2 loose stools a day.  She denies dyspnea or chest pain.  She is weak after her prolonged hospital course and is not able to transfer from wheelchair to chair or bed, so is not ready to go home yet.  I cannot tell from the nursing home records if she is still on digoxin or not, but she had a level of 2.2 not long ago and her ECG looks like digoxin effect.   ECG: NSR, 1 mm ST depression inferiorly and in V3-V6 (looks like  digoxin effect)  Labs (6/12): BNP 88 Labs (7/12): K 3.9, creatinine 0.6, BNP 86 Labs (10/12): K 3.7, creatinine 0.6 Labs (3/13): WBCs 15, HCT 40.5, plts 129, AST 55, ALT 64, BNP 6897 Labs (4/13): K 4.1, creatinine 0.45, digoxin 2.2  PMH:  1. Mitral valve prolapse with mitral regurgitation.  Echo (2/06): Moderate MR.  Echo (6/12): EF 60%, normal LV size, indeterminant diastolic function (suspect pseudonormal), bileaflet mitral valve prolapse with severe mitral regurgitation, moderate LAE, PA systolic pressure 28 mmHg.  TEE (7/12): EF 60%, normal LV size, severe MR with bileaflet prolapse, RV-RA gradient 30 mmHg.  Minimally invasive mitral valve repair with complex Alfieri edge-to-edge repair and annuloplasty ring.  Echo (2/13) showed EF 55-60% with septal bounce, MV repair with Alfieri stitch, no MR, possible mild to moderate mitral stenosis with normal RV and PASP 41 mmHg.  Echo (3/13) showed EF 55-60%, MV repair with no MR and mean gradient of 9 mmHg across the valve, moderate LAE.  2. Astrocytoma s/p resection and radiation in 1971.  She was left with residual spasticity and ataxia and is mainly limited to a wheelchair.  3. Hyperlipidemia 4. Arterial dopplers (2/09): normal 5. Diastolic CHF 6. Atrial fibrillation: Paroxysmal.  Now on coumadin and amiodarone.  7. C difficile colitis in 3/13 (severe with prolonged hospitalization).   SH: Uses wheelchair but can walk short distances with walker. Lives by herself in [place] but sister and mother help.  Nonsmoker.  Occasional ETOH.   FH: Father with MI at 60  ROS: all systems reviewed and negative except as per HPI.   Current Outpatient Prescriptions  Medication Sig Dispense Refill  . amiodarone (PACERONE) 100 MG tablet Take 1 tablet (100 mg total) by mouth daily.      . famotidine (PEPCID) 20 MG tablet Take 1 tablet (20 mg total) by mouth daily.      Marland Kitchen levalbuterol (XOPENEX) 0.63 MG/3ML nebulizer solution Take 3 mLs (0.63 mg total) by  nebulization every 6 (six) hours as needed for wheezing or shortness of breath.  3 mL    . ondansetron (ZOFRAN) 4 MG/2ML SOLN injection Inject 2 mLs (4 mg total) into the vein every 6 (six) hours as needed for nausea.  2 mL    . vancomycin (VANCOCIN) 50 mg/mL oral solution Take 10 mLs (500 mg total) by mouth every 6 (six) hours.      Marland Kitchen warfarin (COUMADIN) 3 MG tablet Take 1 tablet (3 mg total) by mouth one time only at 6 PM.      . metoprolol succinate (TOPROL-XL) 12.5 mg TB24 Take 0.5 tablets (12.5 mg total) by mouth daily.      Marland Kitchen DISCONTD: imipramine (TOFRANIL) 25 MG tablet Take 25 mg by mouth at bedtime.       Marland Kitchen DISCONTD: metoprolol tartrate (LOPRESSOR) 12.5 mg TABS Take 12.5 mg by mouth 2 (two) times daily.      Marland Kitchen DISCONTD: polysaccharide iron (NIFEREX) 150 MG CAPS capsule Take 1 capsule (150 mg total) by mouth daily.  30 each  0  . DISCONTD: pravastatin (PRAVACHOL) 80 MG tablet Take 80 mg by mouth daily.      Marland Kitchen DISCONTD: tolterodine (DETROL LA) 4 MG 24 hr capsule Take 4 mg by mouth daily.         BP 110/66  Pulse 79  Resp 18  Ht 4\' 10"  (1.473 m)  Wt 81 lb (36.741 kg)  BMI 16.93 kg/m2 General: thin, no distress Neck: JVP not elevated, no thyromegaly or thyroid nodule.  Lungs: Slight decreased breath sounds at left base.  CV: Nondisplaced PMI.  Heart regular S1/S2, no S3/S4, no murmur.  No peripheral edema.  No carotid bruit.  Normal pedal pulses.  Abdomen: Soft, nontender, no hepatosplenomegaly, no distention.  Neurologic: Alert and oriented x 3, gait ataxia, resting tremor noted in hands.  Psych: Normal affect. Extremities: No clubbing or cyanosis.

## 2012-01-10 NOTE — Assessment & Plan Note (Signed)
Mitral valve repair with Alfieri technique.  No MR on most recent echo.  Suspect mild mitral stenosis.   

## 2012-01-10 NOTE — Assessment & Plan Note (Signed)
Much improved.  Still with loose stools.  She is still on po vancomycin.

## 2012-01-10 NOTE — Assessment & Plan Note (Addendum)
She is in NSR today on amiodarone.  Decrease amiodarone to 100 mg daily and continue coumadin.  If she is still on digoxin, she needs to stop this medication.  I suspect she is still getting it based on her ECG.  Her digoxin level was quite high recently.  Given her size and gender, this is not a good medication for her.  I will have her start Toprol XL 12.5 mg daily since I am decreasing amiodarone and stopping digoxin.  Will get LFTs and TSH given amiodarone use when she follows up.

## 2012-01-21 DIAGNOSIS — M5430 Sciatica, unspecified side: Secondary | ICD-10-CM | POA: Diagnosis not present

## 2012-01-21 DIAGNOSIS — A0472 Enterocolitis due to Clostridium difficile, not specified as recurrent: Secondary | ICD-10-CM | POA: Diagnosis not present

## 2012-01-21 DIAGNOSIS — M6281 Muscle weakness (generalized): Secondary | ICD-10-CM | POA: Diagnosis not present

## 2012-01-21 DIAGNOSIS — H109 Unspecified conjunctivitis: Secondary | ICD-10-CM | POA: Diagnosis not present

## 2012-01-21 DIAGNOSIS — J069 Acute upper respiratory infection, unspecified: Secondary | ICD-10-CM | POA: Diagnosis not present

## 2012-01-21 DIAGNOSIS — M25559 Pain in unspecified hip: Secondary | ICD-10-CM | POA: Diagnosis not present

## 2012-01-21 DIAGNOSIS — R638 Other symptoms and signs concerning food and fluid intake: Secondary | ICD-10-CM | POA: Diagnosis not present

## 2012-01-21 DIAGNOSIS — K5090 Crohn's disease, unspecified, without complications: Secondary | ICD-10-CM | POA: Diagnosis not present

## 2012-01-21 DIAGNOSIS — C719 Malignant neoplasm of brain, unspecified: Secondary | ICD-10-CM | POA: Diagnosis not present

## 2012-01-21 DIAGNOSIS — Z954 Presence of other heart-valve replacement: Secondary | ICD-10-CM | POA: Diagnosis not present

## 2012-01-22 DIAGNOSIS — R638 Other symptoms and signs concerning food and fluid intake: Secondary | ICD-10-CM | POA: Diagnosis not present

## 2012-01-22 DIAGNOSIS — A0472 Enterocolitis due to Clostridium difficile, not specified as recurrent: Secondary | ICD-10-CM | POA: Diagnosis not present

## 2012-01-22 DIAGNOSIS — M25559 Pain in unspecified hip: Secondary | ICD-10-CM | POA: Diagnosis not present

## 2012-01-22 DIAGNOSIS — J069 Acute upper respiratory infection, unspecified: Secondary | ICD-10-CM | POA: Diagnosis not present

## 2012-01-22 DIAGNOSIS — Z954 Presence of other heart-valve replacement: Secondary | ICD-10-CM | POA: Diagnosis not present

## 2012-01-22 DIAGNOSIS — M6281 Muscle weakness (generalized): Secondary | ICD-10-CM | POA: Diagnosis not present

## 2012-01-24 DIAGNOSIS — M6281 Muscle weakness (generalized): Secondary | ICD-10-CM | POA: Diagnosis not present

## 2012-01-24 DIAGNOSIS — R638 Other symptoms and signs concerning food and fluid intake: Secondary | ICD-10-CM | POA: Diagnosis not present

## 2012-01-24 DIAGNOSIS — J069 Acute upper respiratory infection, unspecified: Secondary | ICD-10-CM | POA: Diagnosis not present

## 2012-01-24 DIAGNOSIS — A0472 Enterocolitis due to Clostridium difficile, not specified as recurrent: Secondary | ICD-10-CM | POA: Diagnosis not present

## 2012-01-24 DIAGNOSIS — Z954 Presence of other heart-valve replacement: Secondary | ICD-10-CM | POA: Diagnosis not present

## 2012-01-24 DIAGNOSIS — M25559 Pain in unspecified hip: Secondary | ICD-10-CM | POA: Diagnosis not present

## 2012-01-27 DIAGNOSIS — J069 Acute upper respiratory infection, unspecified: Secondary | ICD-10-CM | POA: Diagnosis not present

## 2012-01-27 DIAGNOSIS — Z954 Presence of other heart-valve replacement: Secondary | ICD-10-CM | POA: Diagnosis not present

## 2012-01-27 DIAGNOSIS — M25559 Pain in unspecified hip: Secondary | ICD-10-CM | POA: Diagnosis not present

## 2012-01-27 DIAGNOSIS — R638 Other symptoms and signs concerning food and fluid intake: Secondary | ICD-10-CM | POA: Diagnosis not present

## 2012-01-27 DIAGNOSIS — M6281 Muscle weakness (generalized): Secondary | ICD-10-CM | POA: Diagnosis not present

## 2012-01-27 DIAGNOSIS — A0472 Enterocolitis due to Clostridium difficile, not specified as recurrent: Secondary | ICD-10-CM | POA: Diagnosis not present

## 2012-01-28 DIAGNOSIS — Z954 Presence of other heart-valve replacement: Secondary | ICD-10-CM | POA: Diagnosis not present

## 2012-01-28 DIAGNOSIS — M25559 Pain in unspecified hip: Secondary | ICD-10-CM | POA: Diagnosis not present

## 2012-01-28 DIAGNOSIS — M6281 Muscle weakness (generalized): Secondary | ICD-10-CM | POA: Diagnosis not present

## 2012-01-28 DIAGNOSIS — R638 Other symptoms and signs concerning food and fluid intake: Secondary | ICD-10-CM | POA: Diagnosis not present

## 2012-01-28 DIAGNOSIS — A0472 Enterocolitis due to Clostridium difficile, not specified as recurrent: Secondary | ICD-10-CM | POA: Diagnosis not present

## 2012-01-28 DIAGNOSIS — J069 Acute upper respiratory infection, unspecified: Secondary | ICD-10-CM | POA: Diagnosis not present

## 2012-01-29 ENCOUNTER — Ambulatory Visit (INDEPENDENT_AMBULATORY_CARE_PROVIDER_SITE_OTHER): Payer: Medicare Other | Admitting: Internal Medicine

## 2012-01-29 ENCOUNTER — Encounter: Payer: Self-pay | Admitting: Internal Medicine

## 2012-01-29 VITALS — BP 110/58 | HR 80 | Temp 97.2°F | Wt 84.8 lb

## 2012-01-29 DIAGNOSIS — I4891 Unspecified atrial fibrillation: Secondary | ICD-10-CM

## 2012-01-29 DIAGNOSIS — A0472 Enterocolitis due to Clostridium difficile, not specified as recurrent: Secondary | ICD-10-CM | POA: Diagnosis not present

## 2012-01-29 DIAGNOSIS — N319 Neuromuscular dysfunction of bladder, unspecified: Secondary | ICD-10-CM | POA: Diagnosis not present

## 2012-01-29 DIAGNOSIS — G709 Myoneural disorder, unspecified: Secondary | ICD-10-CM

## 2012-01-29 DIAGNOSIS — R270 Ataxia, unspecified: Secondary | ICD-10-CM | POA: Insufficient documentation

## 2012-01-29 MED ORDER — TOLTERODINE TARTRATE ER 4 MG PO CP24: 4.0000 mg | ORAL_CAPSULE | Freq: Every day | ORAL | Status: DC

## 2012-01-29 NOTE — Assessment & Plan Note (Signed)
NSR on amio - onset precipitated by critical illness 11/2011 Planning taper off amio and maintain on beta-blocker - mgmt of same per cards

## 2012-01-29 NOTE — Patient Instructions (Signed)
It was good to see you today. We have reviewed your prior records including labs and tests today we will send to your prior provider(s) for "release of records" as discussed today -  Resume Detrol - Your prescription(s) have been submitted to your pharmacy. Please take as directed and contact our office if you believe you are having problem(s) with the medication(s). Other Medications reviewed, no changes at this time. we'll make referral to endocrine to determine if growth hormone still needed, but continue daily dose until that evaluation. Our office will contact you regarding appointment(s) once made. Please schedule followup in 3 months, call sooner if problems.

## 2012-01-29 NOTE — Assessment & Plan Note (Signed)
Chronic but progressive ataxia Baseline able to self transfer and take few steps with walker but debilitated by severe medical illness spring 2013 Pt/family deny "neuro dz" dx beyond remote astrocytoma resection and XRT in 1971 -  ?cervical myelopathy from prior XRT or other Will send for prior records and consider eval by neuro for same ?if related to use of growth hormone - again review records and refer to endo for opinion on need to continue [facility]

## 2012-01-29 NOTE — Assessment & Plan Note (Signed)
Infection associated with severe sepsis, ARDS 11/2011 Much improved - hx reviewed at length today Continue probiotics and will work to avoid antibiotics if possible

## 2012-01-29 NOTE — Progress Notes (Signed)
Subjective:    Patient ID: Michaela [name], female    DOB: 1955/12/06, 56 y.o.   MRN: [id number]  HPI  New pt to me and our PC division - but known to cards - here today to establish PC care Reviewed chronic medical issues:  Hx severe MR - s/p minimally invasive mitral valve repair in 10/2011 with Alfieri edge to edge repair and annuloplasty.  Echo post-op showed normal EF, no MR, and mild to moderate mitral stenosis. No edema or shortness of breath  severe C difficile colitis 11/2011.  septic shock, probable aspiration and ARDS-like picture.  denies recurrent shortness of breath, diarrhea but wants to avoid recrrent C diff "at any cost" - take probiotics, yogurt and Kefir regularly  atrial fibrillation with RVR during sepsis hosp 11/2011. 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 At baseline, patient has poor balance from ataxia and can walk limited distances with a walker but typically uses her wheelchair.  She had been living independently with help from family before 10/2011, [facility] aide 3x/week, currently 24/7 care since DC from SNF 01/17/12 [facility], aide and family)  On growth hormone since 2003 for "low sugar" - ?indication for same and family ?s need to continue - prev followed with endo Evlyn Kanner) for all medical needs  Past Medical History  Diagnosis Date  . Astrocytoma brain tumor 1971    astrocytoma treated with radiation  . Mitral regurgitation 10/2011    severe, s/p min invasive repair and annuloplasty  . Neuromuscular disorder     numbness in hands and feet  . Osteoarthritis   . Overactive bladder   . Pleural effusion, left 11/11/2011  . C. difficile colitis 11/2011    complicated by  sepsis/ARDS and prolonged hosp   History reviewed. No pertinent family history. History  Substance Use Topics  . Smoking status: Former Smoker    Quit date: 09/09/1990  . Smokeless tobacco: Not on file  . Alcohol Use: Yes     Review of Systems Constitutional: Negative for fever or weight change.  Respiratory: Negative for cough and shortness of breath.   Cardiovascular: Negative for chest pain or palpitations.  Gastrointestinal: Negative for abdominal pain, no bowel changes.  Musculoskeletal: Negative for gait problem or joint swelling.  Skin: Negative for rash.  GU - freq nocturia and urinary incontinence  - ?resume detrol? Neurological: Negative for dizziness or headache.  No other specific complaints in a complete review of systems (except as listed in HPI above).     Objective:   Physical Exam BP 110/58  Pulse 80  Temp(Src) 97.2 F (36.2 C) (Oral)  Wt 84 lb 12.8 oz (38.465 kg)  SpO2 95% Constitutional: She is small statured, in WC. No distress. mom and sister at side HENT: Head: Normocephalic and atraumatic. Ears: B TMs ok, no erythema or effusion; Nose: Nose normal. Mouth/Throat: Oropharynx is clear and moist. No oropharyngeal exudate.  Eyes: disconjugate gase with nystagmus beats -Conjunctivae and EOM are normal. Pupils are equal, round. No scleral icterus.  Neck: Normal range of motion. Neck supple. No JVD or LAD present. No thyromegaly present.  Cardiovascular: Normal rate, regular rhythm and normal heart sounds.  No murmur heard. No BLE edema. Pulmonary/Chest:  Effort normal and breath sounds normal. No respiratory distress. She has no wheezes.  Abdominal: Soft. Bowel sounds are normal. She exhibits no distension. There is no tenderness. no masses Musculoskeletal:  no joint effusions. No gross deformities. Mod muscle mass atrophy of BLE. Neurological: She is alert and oriented to person, place, and time. No cranial nerve deficit. Ataxic BUE, slow and deliberate  voice-pattern. Gait not tested  Skin: Skin is warm and dry. No rash noted. No erythema.  Psychiatric: She has a normal mood and affect. Her behavior is normal. Judgment and thought content normal.   Lab Results  Component Value Date   WBC 11.1* 12/22/2011   HGB 9.7* 12/22/2011   HCT 31.2* 12/22/2011   PLT 137* 12/22/2011   GLUCOSE 84 12/24/2011   ALT 9 12/02/2011   AST 21 12/02/2011   NA 139 12/24/2011   K 4.1 12/24/2011   CL 99 12/24/2011   CREATININE 0.45* 12/24/2011   BUN 13 12/24/2011   CO2 33* 12/24/2011   INR 1.29 12/05/2011   HGBA1C 5.5 10/11/2011      Assessment & Plan:  See problem list. Medications and labs reviewed today.  Time spent with pt/family today 45 minutes, greater than 50% time spent counseling patient on recurrent hospitalizations spring 2013, ataxia and neurologic hx, ?need for growth hormone and medication review. Also review of prior records and ROI from prior PCP [facility])

## 2012-01-29 NOTE — Assessment & Plan Note (Signed)
Resume detrol as prior to acute illness

## 2012-01-30 DIAGNOSIS — M6281 Muscle weakness (generalized): Secondary | ICD-10-CM | POA: Diagnosis not present

## 2012-01-30 DIAGNOSIS — A0472 Enterocolitis due to Clostridium difficile, not specified as recurrent: Secondary | ICD-10-CM | POA: Diagnosis not present

## 2012-01-30 DIAGNOSIS — M25559 Pain in unspecified hip: Secondary | ICD-10-CM | POA: Diagnosis not present

## 2012-01-30 DIAGNOSIS — R638 Other symptoms and signs concerning food and fluid intake: Secondary | ICD-10-CM | POA: Diagnosis not present

## 2012-01-30 DIAGNOSIS — J069 Acute upper respiratory infection, unspecified: Secondary | ICD-10-CM | POA: Diagnosis not present

## 2012-01-30 DIAGNOSIS — Z954 Presence of other heart-valve replacement: Secondary | ICD-10-CM | POA: Diagnosis not present

## 2012-01-31 DIAGNOSIS — Z954 Presence of other heart-valve replacement: Secondary | ICD-10-CM | POA: Diagnosis not present

## 2012-01-31 DIAGNOSIS — M25559 Pain in unspecified hip: Secondary | ICD-10-CM | POA: Diagnosis not present

## 2012-01-31 DIAGNOSIS — J069 Acute upper respiratory infection, unspecified: Secondary | ICD-10-CM | POA: Diagnosis not present

## 2012-01-31 DIAGNOSIS — A0472 Enterocolitis due to Clostridium difficile, not specified as recurrent: Secondary | ICD-10-CM | POA: Diagnosis not present

## 2012-01-31 DIAGNOSIS — M6281 Muscle weakness (generalized): Secondary | ICD-10-CM | POA: Diagnosis not present

## 2012-01-31 DIAGNOSIS — R638 Other symptoms and signs concerning food and fluid intake: Secondary | ICD-10-CM | POA: Diagnosis not present

## 2012-02-04 DIAGNOSIS — M25559 Pain in unspecified hip: Secondary | ICD-10-CM | POA: Diagnosis not present

## 2012-02-04 DIAGNOSIS — A0472 Enterocolitis due to Clostridium difficile, not specified as recurrent: Secondary | ICD-10-CM | POA: Diagnosis not present

## 2012-02-04 DIAGNOSIS — M6281 Muscle weakness (generalized): Secondary | ICD-10-CM | POA: Diagnosis not present

## 2012-02-04 DIAGNOSIS — Z954 Presence of other heart-valve replacement: Secondary | ICD-10-CM | POA: Diagnosis not present

## 2012-02-04 DIAGNOSIS — R638 Other symptoms and signs concerning food and fluid intake: Secondary | ICD-10-CM | POA: Diagnosis not present

## 2012-02-04 DIAGNOSIS — J069 Acute upper respiratory infection, unspecified: Secondary | ICD-10-CM | POA: Diagnosis not present

## 2012-02-05 DIAGNOSIS — M25559 Pain in unspecified hip: Secondary | ICD-10-CM | POA: Diagnosis not present

## 2012-02-05 DIAGNOSIS — J069 Acute upper respiratory infection, unspecified: Secondary | ICD-10-CM | POA: Diagnosis not present

## 2012-02-05 DIAGNOSIS — Z954 Presence of other heart-valve replacement: Secondary | ICD-10-CM | POA: Diagnosis not present

## 2012-02-05 DIAGNOSIS — A0472 Enterocolitis due to Clostridium difficile, not specified as recurrent: Secondary | ICD-10-CM | POA: Diagnosis not present

## 2012-02-05 DIAGNOSIS — M6281 Muscle weakness (generalized): Secondary | ICD-10-CM | POA: Diagnosis not present

## 2012-02-05 DIAGNOSIS — R638 Other symptoms and signs concerning food and fluid intake: Secondary | ICD-10-CM | POA: Diagnosis not present

## 2012-02-06 DIAGNOSIS — R638 Other symptoms and signs concerning food and fluid intake: Secondary | ICD-10-CM | POA: Diagnosis not present

## 2012-02-06 DIAGNOSIS — M25559 Pain in unspecified hip: Secondary | ICD-10-CM | POA: Diagnosis not present

## 2012-02-06 DIAGNOSIS — A0472 Enterocolitis due to Clostridium difficile, not specified as recurrent: Secondary | ICD-10-CM | POA: Diagnosis not present

## 2012-02-06 DIAGNOSIS — M6281 Muscle weakness (generalized): Secondary | ICD-10-CM | POA: Diagnosis not present

## 2012-02-06 DIAGNOSIS — Z954 Presence of other heart-valve replacement: Secondary | ICD-10-CM | POA: Diagnosis not present

## 2012-02-06 DIAGNOSIS — J069 Acute upper respiratory infection, unspecified: Secondary | ICD-10-CM | POA: Diagnosis not present

## 2012-02-07 DIAGNOSIS — A0472 Enterocolitis due to Clostridium difficile, not specified as recurrent: Secondary | ICD-10-CM | POA: Diagnosis not present

## 2012-02-07 DIAGNOSIS — M6281 Muscle weakness (generalized): Secondary | ICD-10-CM | POA: Diagnosis not present

## 2012-02-07 DIAGNOSIS — J069 Acute upper respiratory infection, unspecified: Secondary | ICD-10-CM | POA: Diagnosis not present

## 2012-02-07 DIAGNOSIS — R638 Other symptoms and signs concerning food and fluid intake: Secondary | ICD-10-CM | POA: Diagnosis not present

## 2012-02-07 DIAGNOSIS — Z954 Presence of other heart-valve replacement: Secondary | ICD-10-CM | POA: Diagnosis not present

## 2012-02-07 DIAGNOSIS — M25559 Pain in unspecified hip: Secondary | ICD-10-CM | POA: Diagnosis not present

## 2012-02-11 ENCOUNTER — Other Ambulatory Visit (INDEPENDENT_AMBULATORY_CARE_PROVIDER_SITE_OTHER): Payer: Medicare Other

## 2012-02-11 ENCOUNTER — Encounter: Payer: Self-pay | Admitting: Endocrinology

## 2012-02-11 ENCOUNTER — Ambulatory Visit (INDEPENDENT_AMBULATORY_CARE_PROVIDER_SITE_OTHER): Payer: Medicare Other | Admitting: Endocrinology

## 2012-02-11 ENCOUNTER — Telehealth: Payer: Self-pay | Admitting: *Deleted

## 2012-02-11 VITALS — BP 102/68 | HR 84 | Temp 97.3°F | Wt 90.0 lb

## 2012-02-11 DIAGNOSIS — R351 Nocturia: Secondary | ICD-10-CM

## 2012-02-11 DIAGNOSIS — E237 Disorder of pituitary gland, unspecified: Secondary | ICD-10-CM | POA: Diagnosis not present

## 2012-02-11 DIAGNOSIS — I4891 Unspecified atrial fibrillation: Secondary | ICD-10-CM

## 2012-02-11 DIAGNOSIS — G252 Other specified forms of tremor: Secondary | ICD-10-CM | POA: Diagnosis not present

## 2012-02-11 LAB — FOLLICLE STIMULATING HORMONE: FSH: 31.4 m[IU]/mL

## 2012-02-11 LAB — HEPATIC FUNCTION PANEL
ALT: 8 U/L (ref 0–35)
Albumin: 3.1 g/dL — ABNORMAL LOW (ref 3.5–5.2)
Total Protein: 6.3 g/dL (ref 6.0–8.3)

## 2012-02-11 LAB — BASIC METABOLIC PANEL
GFR: 127.68 mL/min (ref >60.00)
Potassium: 4.1 mEq/L (ref 3.5–5.1)
Sodium: 141 mEq/L (ref 135–145)

## 2012-02-11 NOTE — Patient Instructions (Signed)
blood tests are being requested for you today.  You will receive a letter with results. You can stop the growth hormone i would be happy to see you back here whenever you want.

## 2012-02-11 NOTE — Telephone Encounter (Signed)
Per Lab, they cannot release orders for Dr. Shirlee Latch for TSH, LFT and BMP. Orders placed into Epic.

## 2012-02-11 NOTE — Progress Notes (Signed)
Subjective:    Patient ID: Michaela [name], female    DOB: [birth year], 56 y.o.   MRN: [id number]  HPI Pt was rx'ed with XRT for astrocytoma as a child.  She says she was left with pituitary insufficiency, including short stature.  says she has been on injectable growth hormone x at least 10 years.  She says she had menses until menopause a few years ago.   She is now recovering from an episode of c. diff following mitral valve surgery.  She had a few mos of moderate cramps throughout the abdomen, and assoc diarrhea.   Past Medical History  Diagnosis Date  . Astrocytoma brain tumor 1971    astrocytoma treated with radiation  . Mitral regurgitation 10/2011    severe, s/p min invasive repair and annuloplasty  . Neuromuscular disorder     numbness in hands and feet  . Osteoarthritis   . Overactive bladder   . Pleural effusion, left 11/11/2011  . C. difficile colitis 11/2011    complicated by sepsis/ARDS and prolonged hosp    Past Surgical History  Procedure Date  . Ventriculoperitoneal shunt 1971  . Knee arthroscopy     right  . Cardiac catheterization     Jan 2013  . Transesophageal echocardiogram 7/12  . US echocardiography 6/12  . Mitral valve repair 10/15/2011    Procedure: MINIMALLY INVASIVE MITRAL VALVE REPAIR (MVR);  Surgeon: Purcell Nails, MD;  Location: [facility] OR;  Service: Open Heart Surgery;  Laterality: Right;    History   Social History  . Marital Status: Single    Spouse Name: N/A    Number of Children: N/A  . Years of Education: N/A   Occupational History  . Not on file.   Social History Main Topics  . Smoking status: Former Smoker    Quit date: 09/09/1990  . Smokeless tobacco: Not on file  . Alcohol Use: Yes  . Drug Use: No  . Sexually Active: Not on file   Other Topics Concern  . Not on file   Social History Narrative  . No narrative on file    Current Outpatient Prescriptions on File Prior to Visit  Medication Sig Dispense Refill  . albuterol  (PROAIR HFA) 108 (90 BASE) MCG/ACT inhaler Inhale 2 puffs into the lungs every 6 (six) hours as needed.      Marland Kitchen amiodarone (PACERONE) 200 MG tablet Take 100 mg by mouth daily.      Marland Kitchen guaiFENesin (MUCINEX) 600 MG 12 hr tablet Take 1,200 mg by mouth daily.      Marland Kitchen ibuprofen (ADVIL,MOTRIN) 200 MG tablet Take 200 mg by mouth as needed.      . metoprolol succinate (TOPROL-XL) 25 MG 24 hr tablet Take 12.5 mg by mouth daily.      Marland Kitchen saccharomyces boulardii (FLORASTOR) 250 MG capsule Take 250 mg by mouth daily.      . Somatropin (NORDITROPIN NORDIFLEX) 10 MG/1.5ML SOLN Inject 0.5 mLs (3.3333 mg total) into the skin every evening.  1.5 mL  1  . temazepam (RESTORIL) 15 MG capsule Take 15 mg by mouth at bedtime as needed.      . tolterodine (DETROL LA) 4 MG 24 hr capsule Take 1 capsule (4 mg total) by mouth daily.  30 capsule  5  . DISCONTD: imipramine (TOFRANIL) 25 MG tablet Take 25 mg by mouth at bedtime.       Marland Kitchen DISCONTD: metoprolol tartrate (LOPRESSOR) 12.5 mg TABS Take 12.5 mg by mouth 2 (  two) times daily.      Marland Kitchen DISCONTD: polysaccharide iron (NIFEREX) 150 MG CAPS capsule Take 1 capsule (150 mg total) by mouth daily.  30 each  0  . DISCONTD: pravastatin (PRAVACHOL) 80 MG tablet Take 80 mg by mouth daily.        Allergies  Allergen Reactions  . Compazine Other (See Comments)    "eyes get buggy"    No family history on file. Neg for short stature BP 102/68  Pulse 84  Temp(Src) 97.3 F (36.3 C) (Oral)  Wt 90 lb (40.824 kg)  SpO2 90%  Review of Systems denies syncope, rash, depression, headache, visual loss, galactorrhea, weight change, easy bruising, change in facial appearance, rhinorrhea, and n/v.  No menses.  She has chronic nocturia.     Objective:   Physical Exam VS: see vs page GEN: no distress.  Short stature.  In wheelchair.   HEAD: head: no deformity eyes: no periorbital swelling, no proptosis external nose and ears are normal mouth: no lesion seen NECK: supple, thyroid is not  enlarged CHEST WALL: no deformity LUNGS:  Clear to auscultation.   CV: reg rate and rhythm, no murmur.  MUSCULOSKELETAL: muscle bulk and strength are grossly and slightly decreased from normal.  no obvious joint swelling.  gait is not tested (pt says chronically poor).  EXTEMITIES: no edema.   PULSES: dorsalis pedis intact bilat.   NEURO:  cn 2-12 grossly intact.   readily moves all 4's.  sensation is intact to touch on the feet.  Slight coarse tremor.    SKIN:  Normal texture and temperature.  No rash or suspicious lesion is visible.   NODES:  None palpable at the neck.   PSYCH: alert, oriented x3.  Does not appear anxious nor depressed.   FSH and LH: menopausal levels    Assessment & Plan:  H/O short stature.  No evidence of pituitary insufficiency now. Recent c diff infection.  This limites interpretation of sxs. Nocturia.  Very unlikely to be pituitary-related Tremor.  Not pituitary-related

## 2012-02-12 ENCOUNTER — Telehealth: Payer: Self-pay | Admitting: *Deleted

## 2012-02-12 ENCOUNTER — Other Ambulatory Visit: Payer: Self-pay | Admitting: Cardiology

## 2012-02-12 DIAGNOSIS — M25559 Pain in unspecified hip: Secondary | ICD-10-CM | POA: Diagnosis not present

## 2012-02-12 DIAGNOSIS — R638 Other symptoms and signs concerning food and fluid intake: Secondary | ICD-10-CM | POA: Diagnosis not present

## 2012-02-12 DIAGNOSIS — Z954 Presence of other heart-valve replacement: Secondary | ICD-10-CM | POA: Diagnosis not present

## 2012-02-12 DIAGNOSIS — M6281 Muscle weakness (generalized): Secondary | ICD-10-CM | POA: Diagnosis not present

## 2012-02-12 DIAGNOSIS — A0472 Enterocolitis due to Clostridium difficile, not specified as recurrent: Secondary | ICD-10-CM | POA: Diagnosis not present

## 2012-02-12 DIAGNOSIS — J069 Acute upper respiratory infection, unspecified: Secondary | ICD-10-CM | POA: Diagnosis not present

## 2012-02-12 MED ORDER — AMIODARONE HCL 200 MG PO TABS: 100.0000 mg | ORAL_TABLET | Freq: Every day | ORAL | Status: DC

## 2012-02-12 MED ORDER — TEMAZEPAM 15 MG PO CAPS: 15.0000 mg | ORAL_CAPSULE | Freq: Every evening | ORAL | Status: DC | PRN

## 2012-02-12 MED ORDER — METOPROLOL SUCCINATE ER 25 MG PO TB24: 12.5000 mg | ORAL_TABLET | Freq: Every day | ORAL | Status: DC

## 2012-02-12 NOTE — Telephone Encounter (Signed)
Called pt to inform of lab results, left message for pt to callback office (letter also mailed to pt). 

## 2012-02-12 NOTE — Telephone Encounter (Signed)
Pt needs refill called in Pharm is correct

## 2012-02-12 NOTE — Telephone Encounter (Signed)
Called pt to inform of lab results, no answer/number busy.

## 2012-02-13 ENCOUNTER — Telehealth: Payer: Self-pay | Admitting: Internal Medicine

## 2012-02-13 DIAGNOSIS — A0472 Enterocolitis due to Clostridium difficile, not specified as recurrent: Secondary | ICD-10-CM | POA: Diagnosis not present

## 2012-02-13 DIAGNOSIS — Z954 Presence of other heart-valve replacement: Secondary | ICD-10-CM | POA: Diagnosis not present

## 2012-02-13 DIAGNOSIS — M25559 Pain in unspecified hip: Secondary | ICD-10-CM | POA: Diagnosis not present

## 2012-02-13 DIAGNOSIS — J069 Acute upper respiratory infection, unspecified: Secondary | ICD-10-CM | POA: Diagnosis not present

## 2012-02-13 DIAGNOSIS — M6281 Muscle weakness (generalized): Secondary | ICD-10-CM | POA: Diagnosis not present

## 2012-02-13 DIAGNOSIS — R638 Other symptoms and signs concerning food and fluid intake: Secondary | ICD-10-CM | POA: Diagnosis not present

## 2012-02-13 NOTE — Telephone Encounter (Signed)
Left message for pt to callback office.  

## 2012-02-13 NOTE — Telephone Encounter (Signed)
Pt's sister informed of lab results, letter also mailed to pt.

## 2012-02-13 NOTE — Telephone Encounter (Signed)
Pt advised of lab results via VM, per SAE.

## 2012-02-13 NOTE — Telephone Encounter (Signed)
Caller: [name]/Sibling; PCP: Rene Paci;   Returning Call To Office @ [phone] -02/13/12 To Find Out Lab Work Results; PLEASE CALL BACK AT CB#: [phone];

## 2012-02-14 DIAGNOSIS — M25559 Pain in unspecified hip: Secondary | ICD-10-CM | POA: Diagnosis not present

## 2012-02-14 DIAGNOSIS — J069 Acute upper respiratory infection, unspecified: Secondary | ICD-10-CM | POA: Diagnosis not present

## 2012-02-14 DIAGNOSIS — A0472 Enterocolitis due to Clostridium difficile, not specified as recurrent: Secondary | ICD-10-CM | POA: Diagnosis not present

## 2012-02-14 DIAGNOSIS — Z954 Presence of other heart-valve replacement: Secondary | ICD-10-CM | POA: Diagnosis not present

## 2012-02-14 DIAGNOSIS — M6281 Muscle weakness (generalized): Secondary | ICD-10-CM | POA: Diagnosis not present

## 2012-02-14 DIAGNOSIS — R638 Other symptoms and signs concerning food and fluid intake: Secondary | ICD-10-CM | POA: Diagnosis not present

## 2012-02-17 DIAGNOSIS — M6281 Muscle weakness (generalized): Secondary | ICD-10-CM | POA: Diagnosis not present

## 2012-02-17 DIAGNOSIS — J069 Acute upper respiratory infection, unspecified: Secondary | ICD-10-CM | POA: Diagnosis not present

## 2012-02-17 DIAGNOSIS — R638 Other symptoms and signs concerning food and fluid intake: Secondary | ICD-10-CM | POA: Diagnosis not present

## 2012-02-17 DIAGNOSIS — M25559 Pain in unspecified hip: Secondary | ICD-10-CM | POA: Diagnosis not present

## 2012-02-17 DIAGNOSIS — Z954 Presence of other heart-valve replacement: Secondary | ICD-10-CM | POA: Diagnosis not present

## 2012-02-17 DIAGNOSIS — A0472 Enterocolitis due to Clostridium difficile, not specified as recurrent: Secondary | ICD-10-CM | POA: Diagnosis not present

## 2012-02-18 ENCOUNTER — Telehealth: Payer: Self-pay | Admitting: Internal Medicine

## 2012-02-18 ENCOUNTER — Other Ambulatory Visit: Payer: Self-pay | Admitting: *Deleted

## 2012-02-18 DIAGNOSIS — J069 Acute upper respiratory infection, unspecified: Secondary | ICD-10-CM | POA: Diagnosis not present

## 2012-02-18 DIAGNOSIS — R638 Other symptoms and signs concerning food and fluid intake: Secondary | ICD-10-CM | POA: Diagnosis not present

## 2012-02-18 DIAGNOSIS — M25559 Pain in unspecified hip: Secondary | ICD-10-CM | POA: Diagnosis not present

## 2012-02-18 DIAGNOSIS — Z954 Presence of other heart-valve replacement: Secondary | ICD-10-CM | POA: Diagnosis not present

## 2012-02-18 DIAGNOSIS — M6281 Muscle weakness (generalized): Secondary | ICD-10-CM | POA: Diagnosis not present

## 2012-02-18 DIAGNOSIS — A0472 Enterocolitis due to Clostridium difficile, not specified as recurrent: Secondary | ICD-10-CM | POA: Diagnosis not present

## 2012-02-18 MED ORDER — TEMAZEPAM 15 MG PO CAPS: 15.0000 mg | ORAL_CAPSULE | Freq: Every evening | ORAL | Status: DC | PRN

## 2012-02-18 NOTE — Telephone Encounter (Signed)
Caller: [name]/Sibling; PCP: Rene Paci; CB#: [phone]; ; ; Call regarding renewal of restoril to help her rest.  States pharmacy/[facility] never got the Rx.  Per Epic, Rx written for restoril 15mg , 1 q hs #30, RF x 3 02/12/12.  RN advised this is a controlled medication and a printed Rx is available at the office.  Family will pick up Rx.

## 2012-02-18 NOTE — Telephone Encounter (Signed)
Sister spoke with CAN see previous encounter. Needing to pick up rx for restoril. Is this ok.Marland KitchenMarland Kitchen6/11/13@1 :21pm/LMB

## 2012-02-18 NOTE — Telephone Encounter (Signed)
noted 

## 2012-02-18 NOTE — Telephone Encounter (Signed)
Called pt sister (kim) LMOm md ok rx will fax rx to [facility]... 02/18/12@1 :52pm/LMB

## 2012-02-19 DIAGNOSIS — Z954 Presence of other heart-valve replacement: Secondary | ICD-10-CM

## 2012-02-19 DIAGNOSIS — M6281 Muscle weakness (generalized): Secondary | ICD-10-CM

## 2012-02-19 DIAGNOSIS — R638 Other symptoms and signs concerning food and fluid intake: Secondary | ICD-10-CM

## 2012-02-21 DIAGNOSIS — M25559 Pain in unspecified hip: Secondary | ICD-10-CM | POA: Diagnosis not present

## 2012-02-21 DIAGNOSIS — M6281 Muscle weakness (generalized): Secondary | ICD-10-CM | POA: Diagnosis not present

## 2012-02-21 DIAGNOSIS — Z954 Presence of other heart-valve replacement: Secondary | ICD-10-CM | POA: Diagnosis not present

## 2012-02-21 DIAGNOSIS — R638 Other symptoms and signs concerning food and fluid intake: Secondary | ICD-10-CM | POA: Diagnosis not present

## 2012-02-21 DIAGNOSIS — A0472 Enterocolitis due to Clostridium difficile, not specified as recurrent: Secondary | ICD-10-CM | POA: Diagnosis not present

## 2012-02-21 DIAGNOSIS — J069 Acute upper respiratory infection, unspecified: Secondary | ICD-10-CM | POA: Diagnosis not present

## 2012-02-24 DIAGNOSIS — Z954 Presence of other heart-valve replacement: Secondary | ICD-10-CM | POA: Diagnosis not present

## 2012-02-24 DIAGNOSIS — J069 Acute upper respiratory infection, unspecified: Secondary | ICD-10-CM | POA: Diagnosis not present

## 2012-02-24 DIAGNOSIS — A0472 Enterocolitis due to Clostridium difficile, not specified as recurrent: Secondary | ICD-10-CM | POA: Diagnosis not present

## 2012-02-24 DIAGNOSIS — M25559 Pain in unspecified hip: Secondary | ICD-10-CM | POA: Diagnosis not present

## 2012-02-24 DIAGNOSIS — R638 Other symptoms and signs concerning food and fluid intake: Secondary | ICD-10-CM | POA: Diagnosis not present

## 2012-02-24 DIAGNOSIS — M6281 Muscle weakness (generalized): Secondary | ICD-10-CM | POA: Diagnosis not present

## 2012-02-25 DIAGNOSIS — J069 Acute upper respiratory infection, unspecified: Secondary | ICD-10-CM | POA: Diagnosis not present

## 2012-02-25 DIAGNOSIS — M25559 Pain in unspecified hip: Secondary | ICD-10-CM | POA: Diagnosis not present

## 2012-02-25 DIAGNOSIS — Z954 Presence of other heart-valve replacement: Secondary | ICD-10-CM | POA: Diagnosis not present

## 2012-02-25 DIAGNOSIS — A0472 Enterocolitis due to Clostridium difficile, not specified as recurrent: Secondary | ICD-10-CM | POA: Diagnosis not present

## 2012-02-25 DIAGNOSIS — M6281 Muscle weakness (generalized): Secondary | ICD-10-CM | POA: Diagnosis not present

## 2012-02-25 DIAGNOSIS — R638 Other symptoms and signs concerning food and fluid intake: Secondary | ICD-10-CM | POA: Diagnosis not present

## 2012-02-27 DIAGNOSIS — M25559 Pain in unspecified hip: Secondary | ICD-10-CM | POA: Diagnosis not present

## 2012-02-27 DIAGNOSIS — J069 Acute upper respiratory infection, unspecified: Secondary | ICD-10-CM | POA: Diagnosis not present

## 2012-02-27 DIAGNOSIS — R638 Other symptoms and signs concerning food and fluid intake: Secondary | ICD-10-CM | POA: Diagnosis not present

## 2012-02-27 DIAGNOSIS — Z954 Presence of other heart-valve replacement: Secondary | ICD-10-CM | POA: Diagnosis not present

## 2012-02-27 DIAGNOSIS — A0472 Enterocolitis due to Clostridium difficile, not specified as recurrent: Secondary | ICD-10-CM | POA: Diagnosis not present

## 2012-02-27 DIAGNOSIS — M6281 Muscle weakness (generalized): Secondary | ICD-10-CM | POA: Diagnosis not present

## 2012-02-28 DIAGNOSIS — M6281 Muscle weakness (generalized): Secondary | ICD-10-CM | POA: Diagnosis not present

## 2012-02-28 DIAGNOSIS — Z954 Presence of other heart-valve replacement: Secondary | ICD-10-CM | POA: Diagnosis not present

## 2012-02-28 DIAGNOSIS — R638 Other symptoms and signs concerning food and fluid intake: Secondary | ICD-10-CM | POA: Diagnosis not present

## 2012-02-28 DIAGNOSIS — J069 Acute upper respiratory infection, unspecified: Secondary | ICD-10-CM | POA: Diagnosis not present

## 2012-02-28 DIAGNOSIS — A0472 Enterocolitis due to Clostridium difficile, not specified as recurrent: Secondary | ICD-10-CM | POA: Diagnosis not present

## 2012-02-28 DIAGNOSIS — M25559 Pain in unspecified hip: Secondary | ICD-10-CM | POA: Diagnosis not present

## 2012-03-02 DIAGNOSIS — M25559 Pain in unspecified hip: Secondary | ICD-10-CM | POA: Diagnosis not present

## 2012-03-02 DIAGNOSIS — Z954 Presence of other heart-valve replacement: Secondary | ICD-10-CM | POA: Diagnosis not present

## 2012-03-02 DIAGNOSIS — A0472 Enterocolitis due to Clostridium difficile, not specified as recurrent: Secondary | ICD-10-CM | POA: Diagnosis not present

## 2012-03-02 DIAGNOSIS — R638 Other symptoms and signs concerning food and fluid intake: Secondary | ICD-10-CM | POA: Diagnosis not present

## 2012-03-02 DIAGNOSIS — M6281 Muscle weakness (generalized): Secondary | ICD-10-CM | POA: Diagnosis not present

## 2012-03-02 DIAGNOSIS — J069 Acute upper respiratory infection, unspecified: Secondary | ICD-10-CM | POA: Diagnosis not present

## 2012-03-03 ENCOUNTER — Encounter: Payer: Self-pay | Admitting: Cardiology

## 2012-03-03 ENCOUNTER — Other Ambulatory Visit (INDEPENDENT_AMBULATORY_CARE_PROVIDER_SITE_OTHER): Payer: Medicare Other

## 2012-03-03 ENCOUNTER — Ambulatory Visit (INDEPENDENT_AMBULATORY_CARE_PROVIDER_SITE_OTHER): Payer: Medicare Other | Admitting: Cardiology

## 2012-03-03 VITALS — BP 98/60 | HR 76 | Ht <= 58 in | Wt 86.0 lb

## 2012-03-03 DIAGNOSIS — I4891 Unspecified atrial fibrillation: Secondary | ICD-10-CM

## 2012-03-03 DIAGNOSIS — R638 Other symptoms and signs concerning food and fluid intake: Secondary | ICD-10-CM | POA: Diagnosis not present

## 2012-03-03 DIAGNOSIS — Z954 Presence of other heart-valve replacement: Secondary | ICD-10-CM | POA: Diagnosis not present

## 2012-03-03 DIAGNOSIS — I059 Rheumatic mitral valve disease, unspecified: Secondary | ICD-10-CM | POA: Diagnosis not present

## 2012-03-03 DIAGNOSIS — I509 Heart failure, unspecified: Secondary | ICD-10-CM

## 2012-03-03 DIAGNOSIS — M6281 Muscle weakness (generalized): Secondary | ICD-10-CM | POA: Diagnosis not present

## 2012-03-03 DIAGNOSIS — I5032 Chronic diastolic (congestive) heart failure: Secondary | ICD-10-CM | POA: Diagnosis not present

## 2012-03-03 DIAGNOSIS — A0472 Enterocolitis due to Clostridium difficile, not specified as recurrent: Secondary | ICD-10-CM | POA: Diagnosis not present

## 2012-03-03 DIAGNOSIS — R0989 Other specified symptoms and signs involving the circulatory and respiratory systems: Secondary | ICD-10-CM

## 2012-03-03 DIAGNOSIS — J069 Acute upper respiratory infection, unspecified: Secondary | ICD-10-CM | POA: Diagnosis not present

## 2012-03-03 DIAGNOSIS — M25559 Pain in unspecified hip: Secondary | ICD-10-CM | POA: Diagnosis not present

## 2012-03-03 DIAGNOSIS — I480 Paroxysmal atrial fibrillation: Secondary | ICD-10-CM | POA: Insufficient documentation

## 2012-03-03 MED ORDER — ASPIRIN EC 81 MG PO TBEC: 81.0000 mg | DELAYED_RELEASE_TABLET | Freq: Every day | ORAL | Status: DC

## 2012-03-03 MED ORDER — ZOLPIDEM TARTRATE 5 MG PO TABS: 5.0000 mg | ORAL_TABLET | Freq: Every evening | ORAL | Status: DC | PRN

## 2012-03-03 NOTE — Assessment & Plan Note (Signed)
Volume status looks good today.  Do not think she needs to restart Lasix.

## 2012-03-03 NOTE — Assessment & Plan Note (Signed)
Mitral valve repair with Alfieri technique.  No MR on most recent echo.  Suspect mild mitral stenosis.

## 2012-03-03 NOTE — Progress Notes (Signed)
Patient ID: Michaela [name], female   DOB: Jan 22, 1956, 56 y.o.   MRN: [id number] PCP: Dr. Felicity Coyer  56 yo with history of cerebral astrocytoma s/p resection with residual ataxia and spasticity as well as mitral regurgitation returns for cardiology followup.  At baseline, patient has poor balance from ataxia and can walk limited distances with a walker but typically uses her wheelchair.  She had been living independently with help from family.  TTE in 2012 suggested significant mitral regurgitation.  I did a TEE to more closely assess the valve in 7/12.  This confirmed severe MR with bileaflet prolapse and EF 60%.  I started her on Lasix 20 mg daily (later increased to 20 mg bid), and she reports significant improvement in breathing.  She underwent minimally invasive mitral valve repair in 2/13 with Alfieri edge to edge repair and annuloplasty.  Echo post-op showed normal EF, no MR, and mild to moderate mitral stenosis.  She was discharged to [facility].  She developed dyspnea after discharge and was found to have a large left pleural effusion.  She underwent thoracentesis and Lasix was increased, with resolution of dyspnea.  She then developed severe C difficile colitis and was admitted again to [facility] in 3/13.  She was hypotensive requiring pressors.  She had probable aspiration with intubation and development of an ARDS-like picture.  She also went into atrial fibrillation with RVR.  After a prolonged course in the hospital, she went to SNF and is now back home again.  She is in NSR on amiodarone. Diarrhea has totally resolved.  She denies dyspnea or chest pain.  She is getting strong and actually feels better than before the surgery.    Labs (6/12): BNP 88 Labs (7/12): K 3.9, creatinine 0.6, BNP 86 Labs (10/12): K 3.7, creatinine 0.6 Labs (3/13): WBCs 15, HCT 40.5, plts 129, AST 55, ALT 64, BNP 6897 Labs (4/13): K 4.1, creatinine 0.45, digoxin 2.2 Labs (6/13): TSH normal, LFTs normal, K  4.1, creatinine 0.5  PMH:  1. Mitral valve prolapse with mitral regurgitation.  Echo (2/06): Moderate MR.  Echo (6/12): EF 60%, normal LV size, indeterminant diastolic function (suspect pseudonormal), bileaflet mitral valve prolapse with severe mitral regurgitation, moderate LAE, PA systolic pressure 28 mmHg.  TEE (7/12): EF 60%, normal LV size, severe MR with bileaflet prolapse, RV-RA gradient 30 mmHg.  Minimally invasive mitral valve repair with complex Alfieri edge-to-edge repair and annuloplasty ring.  Echo (2/13) showed EF 55-60% with septal bounce, MV repair with Alfieri stitch, no MR, possible mild to moderate mitral stenosis with normal RV and PASP 41 mmHg.  Echo (3/13) showed EF 55-60%, MV repair with no MR and mean gradient of 9 mmHg across the valve, moderate LAE.  2. Astrocytoma s/p resection and radiation in 1971.  She was left with residual spasticity and ataxia and is mainly limited to a wheelchair.  3. Hyperlipidemia 4. Arterial dopplers (2/09): normal 5. Diastolic CHF 6. Atrial fibrillation: Paroxysmal.  Now on coumadin and amiodarone.  7. C difficile colitis in 3/13 (severe with prolonged hospitalization).   SH: Uses wheelchair but can walk short distances with walker. Lives by herself in [place] but sister and mother help.  Nonsmoker.  Occasional ETOH.   FH: Father with MI at 55  Current Outpatient Prescriptions  Medication Sig Dispense Refill  . albuterol (PROAIR HFA) 108 (90 BASE) MCG/ACT inhaler Inhale 2 puffs into the lungs every 6 (six) hours as needed.      Marland Kitchen amiodarone (PACERONE)  200 MG tablet Take 0.5 tablets (100 mg total) by mouth daily.  15 tablet  3  . ibuprofen (ADVIL,MOTRIN) 200 MG tablet Take 200 mg by mouth as needed.      . metoprolol succinate (TOPROL-XL) 25 MG 24 hr tablet Take 0.5 tablets (12.5 mg total) by mouth daily.  15 tablet  3  . saccharomyces boulardii (FLORASTOR) 250 MG capsule Take 250 mg by mouth daily.      . temazepam (RESTORIL) 15 MG  capsule Take 1 capsule (15 mg total) by mouth at bedtime as needed.  30 capsule  3  . tolterodine (DETROL LA) 4 MG 24 hr capsule Take 1 capsule (4 mg total) by mouth daily.  30 capsule  5  . vitamin B-12 (CYANOCOBALAMIN) 1000 MCG tablet Take 1,000 mcg by mouth daily.      . vitamin E 400 UNIT capsule Take 400 Units by mouth daily.      Marland Kitchen aspirin EC 81 MG tablet Take 1 tablet (81 mg total) by mouth daily.  90 tablet  3  . zolpidem (AMBIEN) 5 MG tablet Take 1 tablet (5 mg total) by mouth at bedtime as needed for sleep.  30 tablet  0  . DISCONTD: imipramine (TOFRANIL) 25 MG tablet Take 25 mg by mouth at bedtime.       Marland Kitchen DISCONTD: metoprolol tartrate (LOPRESSOR) 12.5 mg TABS Take 12.5 mg by mouth 2 (two) times daily.      Marland Kitchen DISCONTD: polysaccharide iron (NIFEREX) 150 MG CAPS capsule Take 1 capsule (150 mg total) by mouth daily.  30 each  0  . DISCONTD: pravastatin (PRAVACHOL) 80 MG tablet Take 80 mg by mouth daily.        BP 98/60  Pulse 76  Ht 4\' 10"  (1.473 m)  Wt 39.009 kg (86 lb)  BMI 17.97 kg/m2  SpO2 98% General: thin, no distress Neck: JVP not elevated, no thyromegaly or thyroid nodule.  Lungs: Slight decreased breath sounds at left base.  CV: Nondisplaced PMI.  Heart regular S1/S2, no S3/S4, no murmur.  No peripheral edema.  No carotid bruit.  Normal pedal pulses.  Abdomen: Soft, nontender, no hepatosplenomegaly, no distention.  Neurologic: Alert and oriented x 3, gait ataxia, resting tremor noted in hands.  Psych: Normal affect. Extremities: No clubbing or cyanosis.

## 2012-03-03 NOTE — Assessment & Plan Note (Signed)
She is in NSR today on amiodarone.  TSH and LFTs recently were normal and she sees an eye doctor yearly.  No recent documented atrial fibrillation and she is off coumadin.  Hopefully this was post-op afib and afib related to a significant stressor (severe C difficile colitis) and will not recur.  She should start ASA 81 mg daily.  If atrial fibrillation is documented again, she should go on coumadin or one of the novel oral anticoagulants.

## 2012-03-03 NOTE — Patient Instructions (Addendum)
Please start ASA 81 mg a day May take Ambien 5 mg as needed for sleep Continue all other medications as listed  Follow up with Dr Shirlee Latch in 4 months.

## 2012-03-04 DIAGNOSIS — A0472 Enterocolitis due to Clostridium difficile, not specified as recurrent: Secondary | ICD-10-CM | POA: Diagnosis not present

## 2012-03-04 DIAGNOSIS — J069 Acute upper respiratory infection, unspecified: Secondary | ICD-10-CM | POA: Diagnosis not present

## 2012-03-04 DIAGNOSIS — M25559 Pain in unspecified hip: Secondary | ICD-10-CM | POA: Diagnosis not present

## 2012-03-04 DIAGNOSIS — Z954 Presence of other heart-valve replacement: Secondary | ICD-10-CM | POA: Diagnosis not present

## 2012-03-04 DIAGNOSIS — R638 Other symptoms and signs concerning food and fluid intake: Secondary | ICD-10-CM | POA: Diagnosis not present

## 2012-03-04 DIAGNOSIS — M6281 Muscle weakness (generalized): Secondary | ICD-10-CM | POA: Diagnosis not present

## 2012-03-06 DIAGNOSIS — M6281 Muscle weakness (generalized): Secondary | ICD-10-CM | POA: Diagnosis not present

## 2012-03-06 DIAGNOSIS — M25559 Pain in unspecified hip: Secondary | ICD-10-CM | POA: Diagnosis not present

## 2012-03-06 DIAGNOSIS — A0472 Enterocolitis due to Clostridium difficile, not specified as recurrent: Secondary | ICD-10-CM | POA: Diagnosis not present

## 2012-03-06 DIAGNOSIS — J069 Acute upper respiratory infection, unspecified: Secondary | ICD-10-CM | POA: Diagnosis not present

## 2012-03-06 DIAGNOSIS — Z954 Presence of other heart-valve replacement: Secondary | ICD-10-CM | POA: Diagnosis not present

## 2012-03-06 DIAGNOSIS — R638 Other symptoms and signs concerning food and fluid intake: Secondary | ICD-10-CM | POA: Diagnosis not present

## 2012-03-10 DIAGNOSIS — R638 Other symptoms and signs concerning food and fluid intake: Secondary | ICD-10-CM | POA: Diagnosis not present

## 2012-03-10 DIAGNOSIS — M6281 Muscle weakness (generalized): Secondary | ICD-10-CM | POA: Diagnosis not present

## 2012-03-10 DIAGNOSIS — J069 Acute upper respiratory infection, unspecified: Secondary | ICD-10-CM | POA: Diagnosis not present

## 2012-03-10 DIAGNOSIS — A0472 Enterocolitis due to Clostridium difficile, not specified as recurrent: Secondary | ICD-10-CM | POA: Diagnosis not present

## 2012-03-10 DIAGNOSIS — M25559 Pain in unspecified hip: Secondary | ICD-10-CM | POA: Diagnosis not present

## 2012-03-10 DIAGNOSIS — Z954 Presence of other heart-valve replacement: Secondary | ICD-10-CM | POA: Diagnosis not present

## 2012-03-17 DIAGNOSIS — M25559 Pain in unspecified hip: Secondary | ICD-10-CM | POA: Diagnosis not present

## 2012-03-17 DIAGNOSIS — M6281 Muscle weakness (generalized): Secondary | ICD-10-CM | POA: Diagnosis not present

## 2012-03-17 DIAGNOSIS — J069 Acute upper respiratory infection, unspecified: Secondary | ICD-10-CM | POA: Diagnosis not present

## 2012-03-17 DIAGNOSIS — Z954 Presence of other heart-valve replacement: Secondary | ICD-10-CM | POA: Diagnosis not present

## 2012-03-17 DIAGNOSIS — R638 Other symptoms and signs concerning food and fluid intake: Secondary | ICD-10-CM | POA: Diagnosis not present

## 2012-03-17 DIAGNOSIS — A0472 Enterocolitis due to Clostridium difficile, not specified as recurrent: Secondary | ICD-10-CM | POA: Diagnosis not present

## 2012-03-18 IMAGING — CR DG CHEST 1V PORT
1 series · 1 of 1 positions shown · non-contrast
Comparison: Earlier today.

CLINICAL DATA: Intubated.

PORTABLE CHEST - 1 VIEW

[view not recorded]
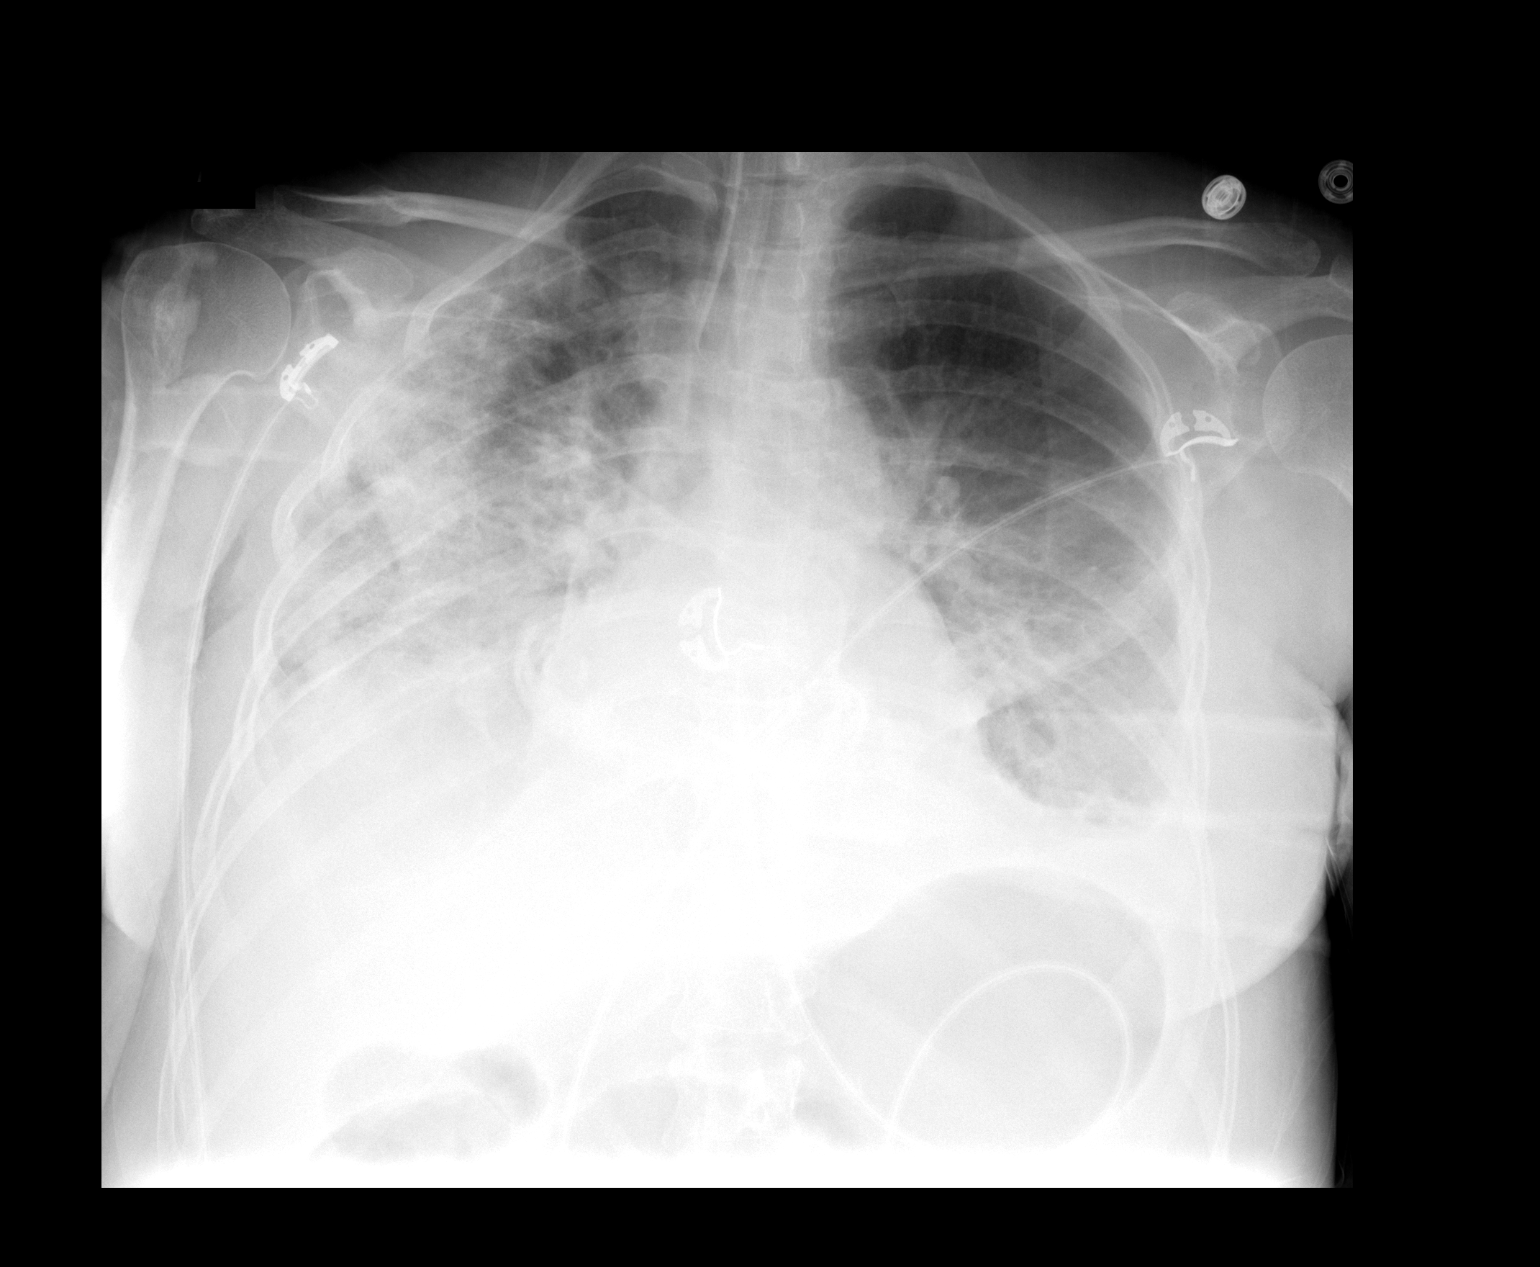

[1 of 1 positions shown; findings below may reference images not displayed]

FINDINGS: Interval endotracheal tube in satisfactory position.  The
heart remains normal in size.  Increased right lung airspace
opacity and mildly improved left lung airspace opacity.  A
prosthetic heart valve is again demonstrated.  Interval curvilinear
density at the medial right lung base.  A small left pleural
effusion is unchanged.
IMPRESSION: 1.  Satisfactory position of the endotracheal tube.
2.  Increased right lung pneumonia or asymmetrical alveolar edema.
3.  Improving left lung atelectasis and possible pneumonia.
4.  Small left pleural effusion.
5.  Interval probable atelectasis at the medial right lung base.

## 2012-03-18 IMAGING — CT CT HEAD W/O CM
1 of 2 series · 13 of 30 positions shown, 17 images · non-contrast
Comparison: 10/08/2005

CLINICAL DATA: Elevated INR, query intracranial bleeding. Remote
astrocytoma at age 14.

CT HEAD WITHOUT CONTRAST
TECHNIQUE: Contiguous axial images were obtained from the base of
the skull through the vertex without contrast.

[Series 2: brain · axial · 0.47mm/px · z∈[+96,+216]mm · 13 of 28 slices shown, 17 images]
[im 2/28  brain]
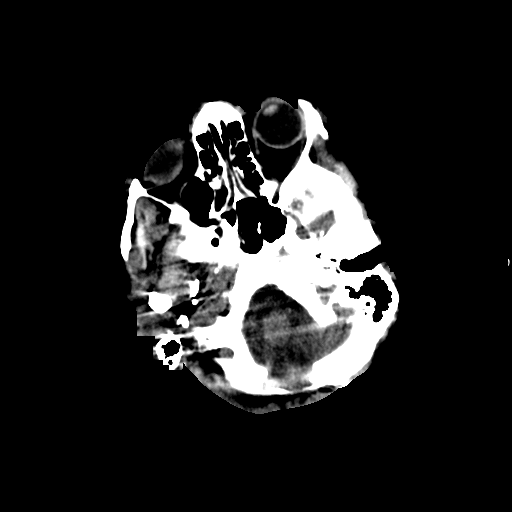
[im 2/28  bone]
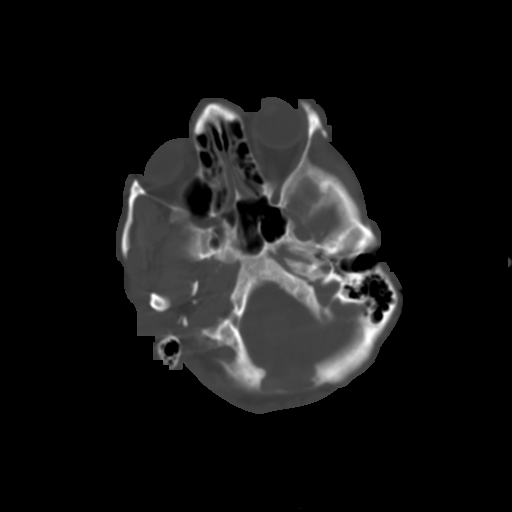
[im 4/28  brain]
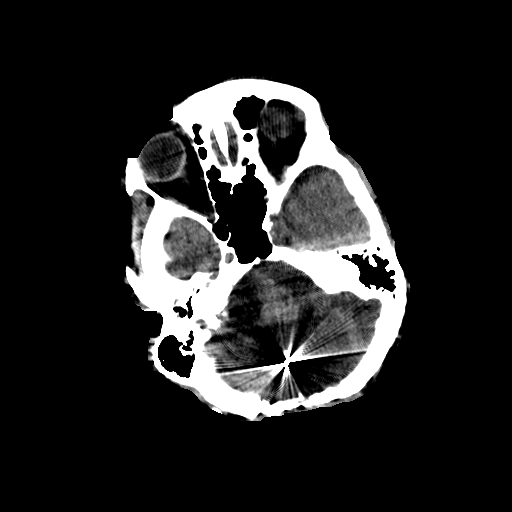
[im 6/28  brain]
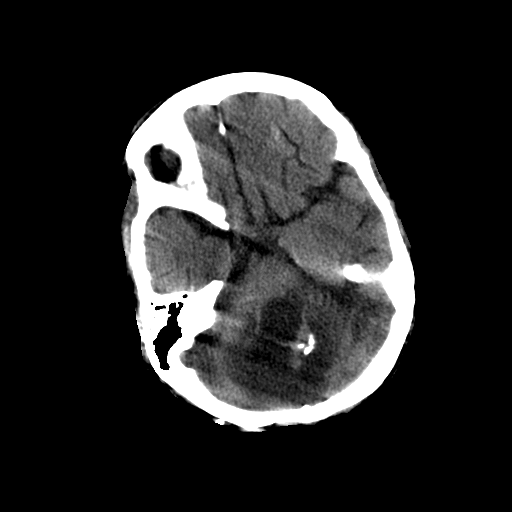
[im 8/28  brain]
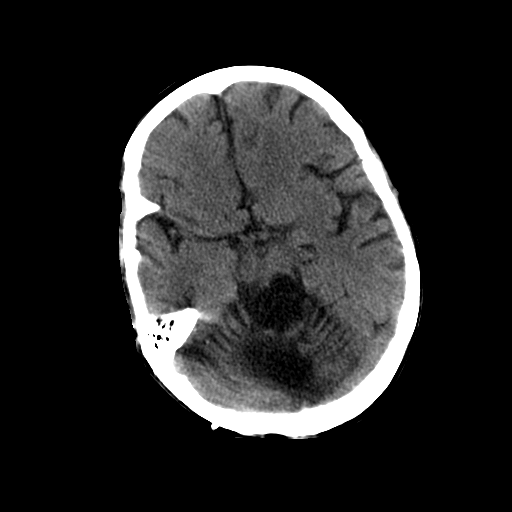
[im 10/28  brain]
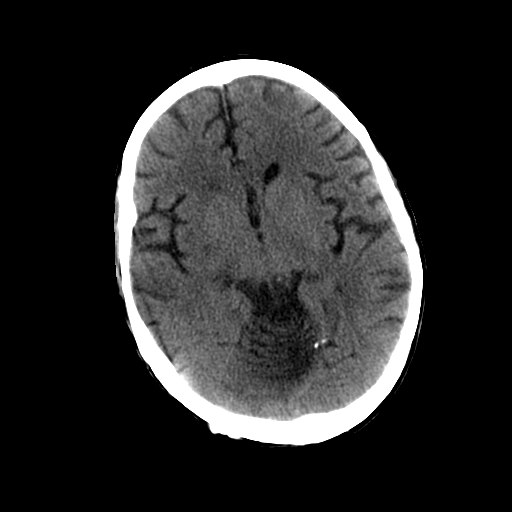
[im 10/28  bone]
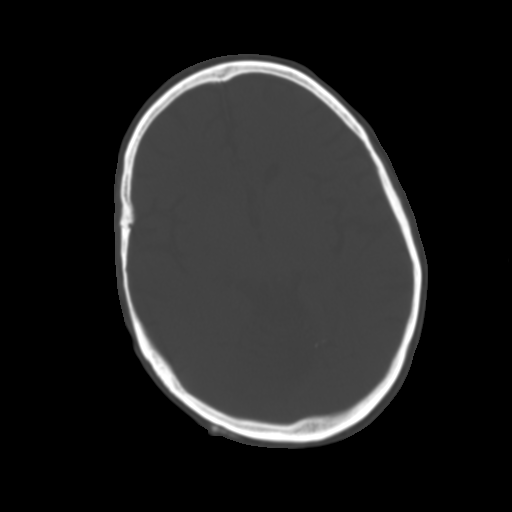
[im 12/28  brain]
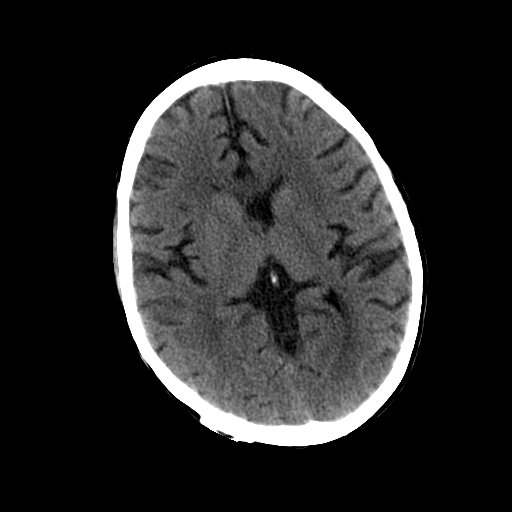
[im 14/28  brain]
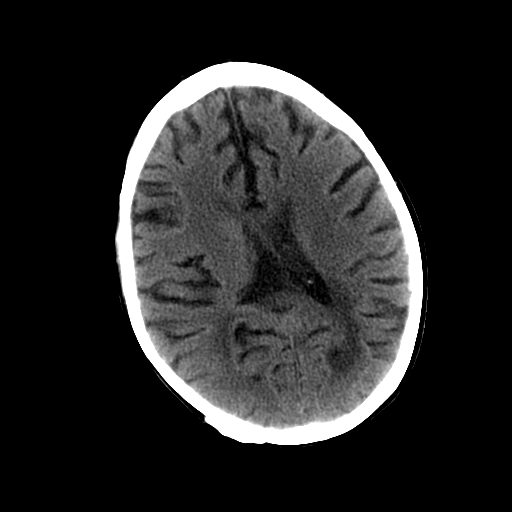
[im 16/28  brain]
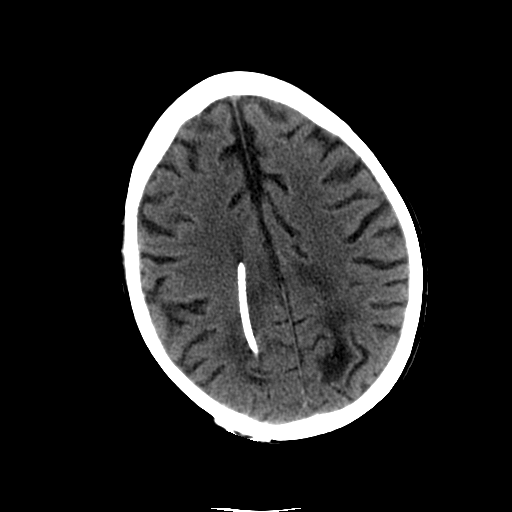
[im 18/28  brain]
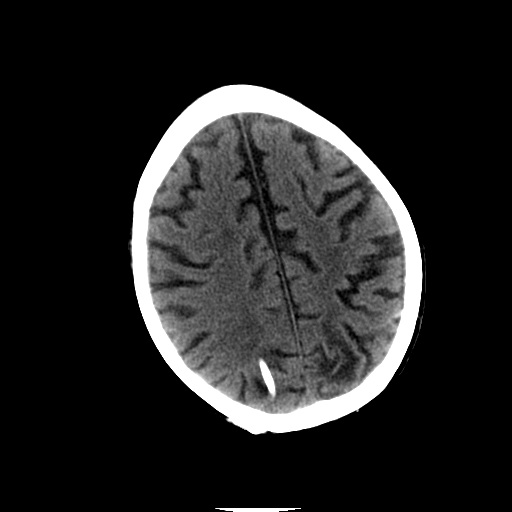
[im 18/28  bone]
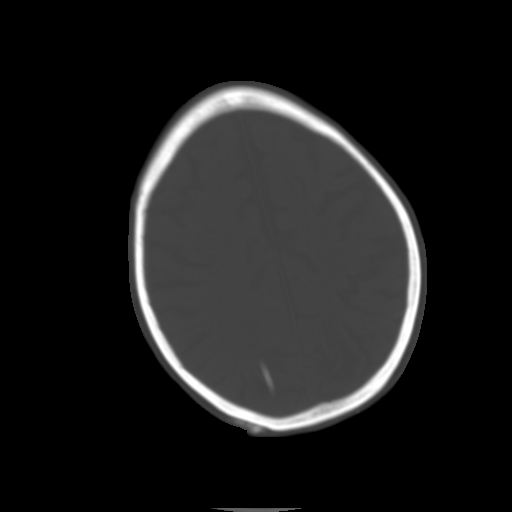
[im 20/28  brain]
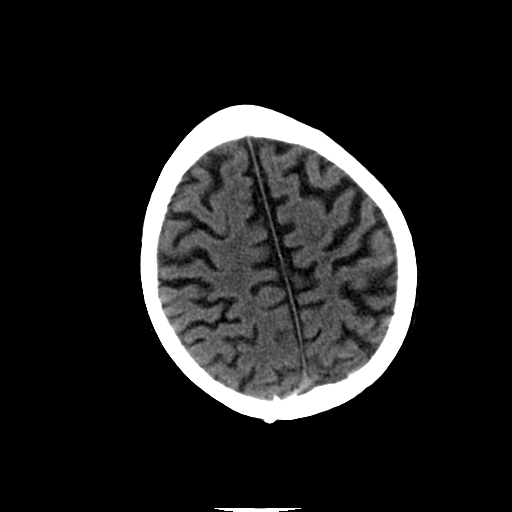
[im 22/28  brain]
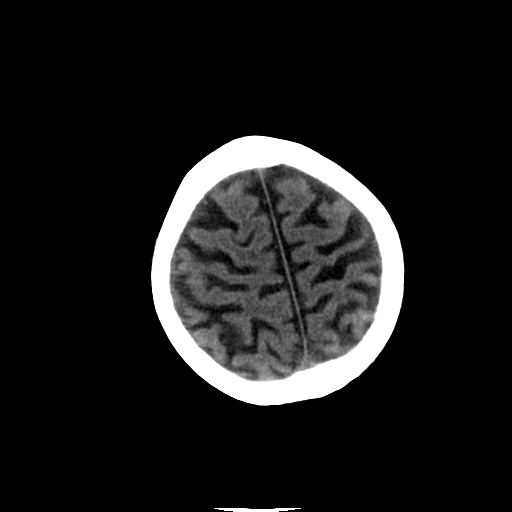
[im 24/28  brain]
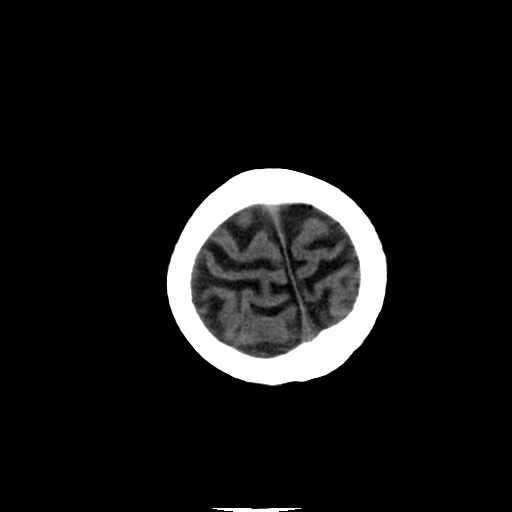
[im 26/28  brain]
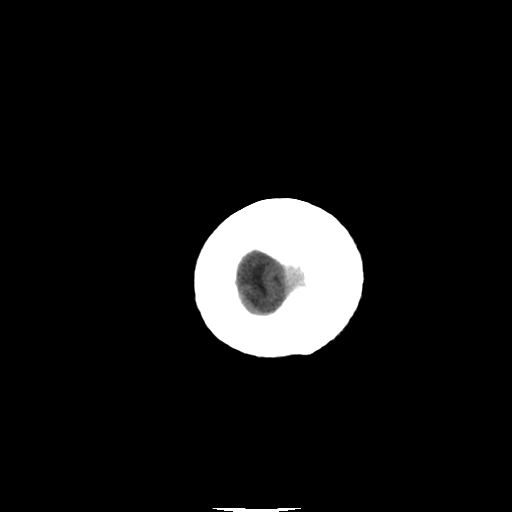
[im 26/28  bone]
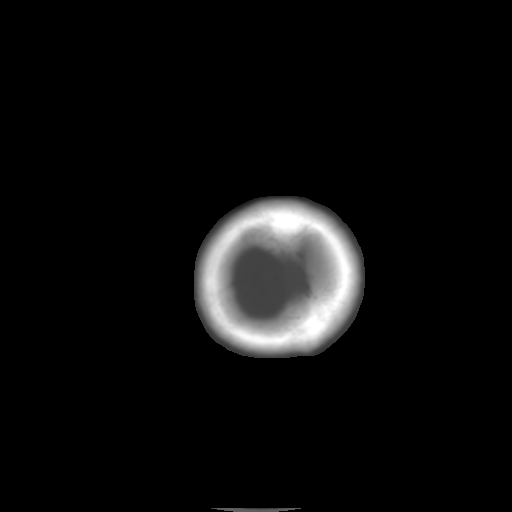

[13 of 30 positions shown; findings below may reference images not displayed]

FINDINGS: Postoperative findings in the postop posterior fossa
noted with cerebellar encephalomalacia.  Stable cystic appearance
along the fourth ventricle noted, with posterior can cavity of
portions of the pons and midbrain.  These findings appears similar
to the prior MRI from 8113.

Right sided shunt noted with tip in the right lateral ventricle.
Parietal white matter hypodensities are again noted.

No acute intracranial findings are observed.  No intracranial
hemorrhage, mass lesion, or acute CVA noted.
IMPRESSION: 1.  Stable appearance of the posterior fossa and of the lateral
ventricles, with continued white matter hypodensities in the
parietal lobes.
2.  Right lateral ventricular shunt noted.
3.  No acute intracranial findings.

## 2012-03-18 IMAGING — CR DG CHEST 1V PORT
1 series · 1 of 1 positions shown · non-contrast
Comparison: Same day

CLINICAL DATA: Left internal jugular central line

PORTABLE CHEST - 1 VIEW

[view not recorded]
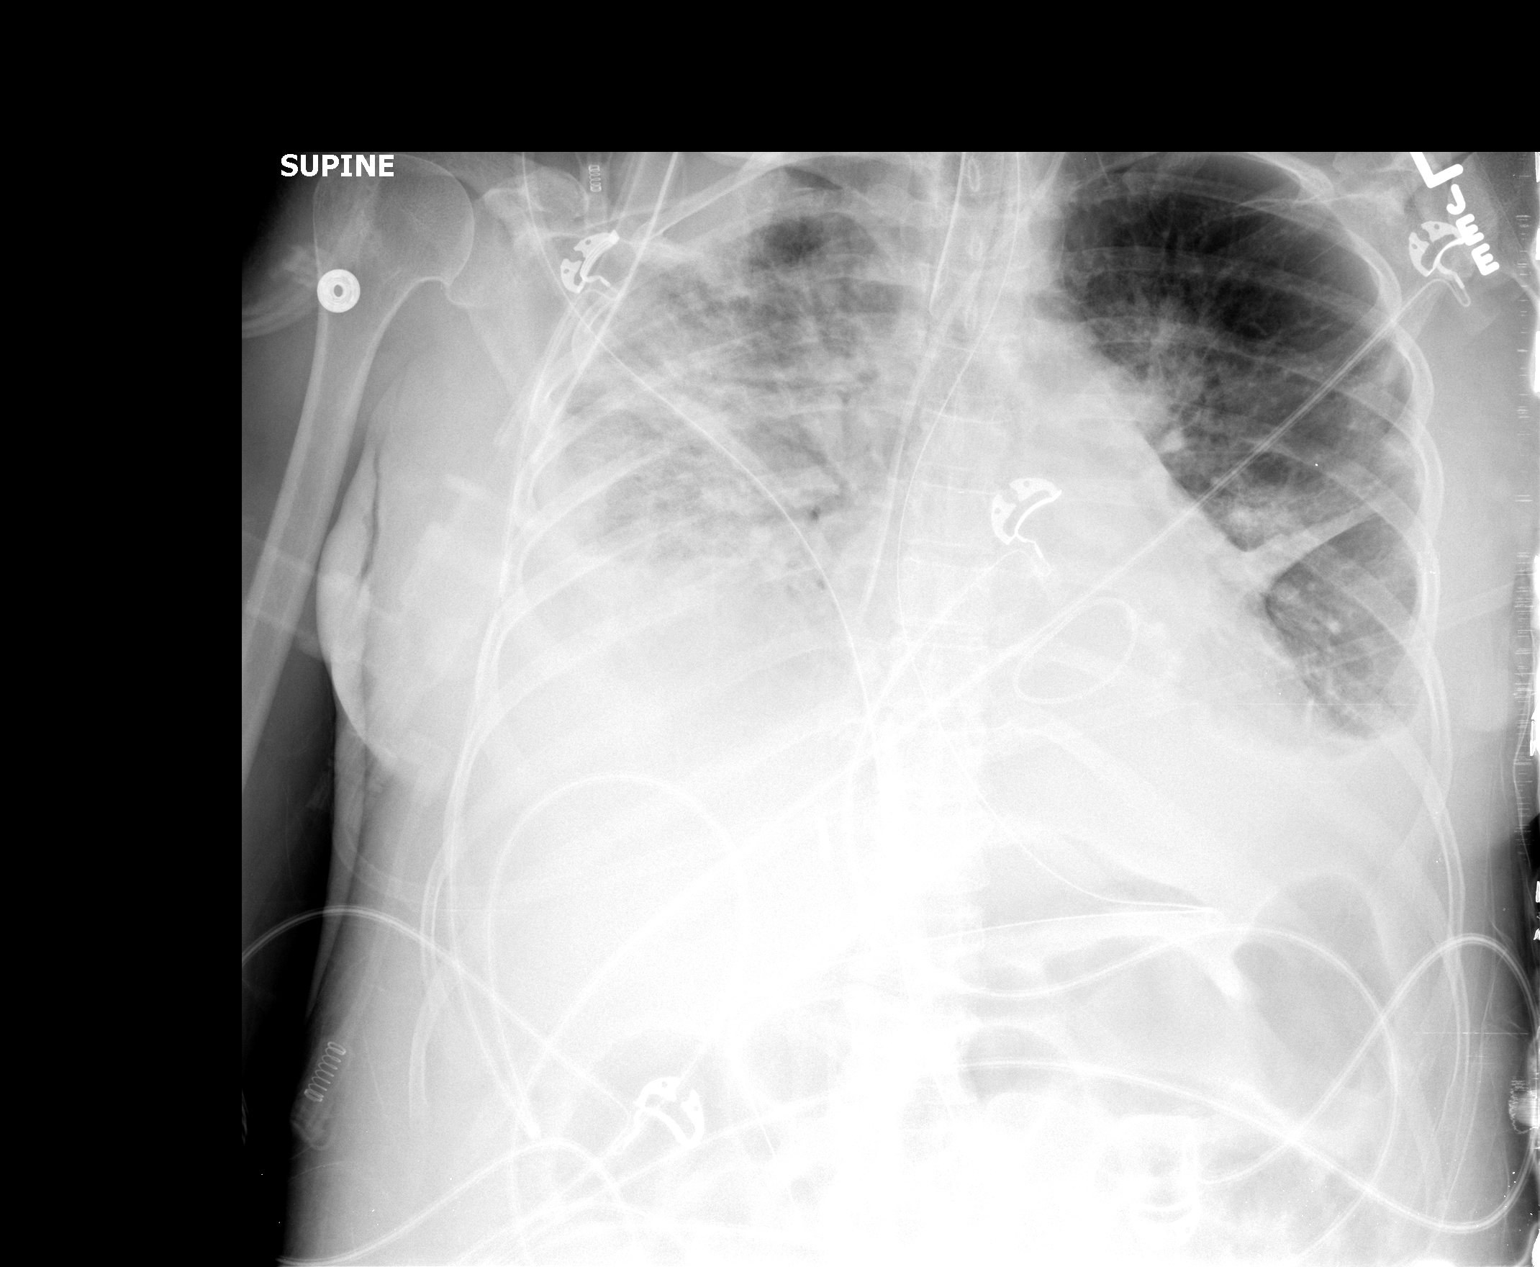

[1 of 1 positions shown; findings below may reference images not displayed]

FINDINGS: Endotracheal tube has its tip 3 cm above the carina.
Nasogastric tube enters the abdomen and probably extends into the
descending duodenum.  Left internal jugular central line has its
tip either at the SVC/RA junction or within the right atrium.
There has been previous valve replacement.  Bilateral pulmonary
density has worsened, more on the right than the left.  Findings
are most consistent with bronchopneumonia.  Bilateral effusions
persist.  An element of pulmonary edema is not excluded.
IMPRESSION: Left internal jugular central line has its tip at the SVC/RA
junction or within the right atrium.  No pneumothorax.

Persistent bilateral airspace density right worse than left with
bilateral effusions.  Pneumonia is suspected, though there could be
coexistent edema.

## 2012-03-19 DIAGNOSIS — R638 Other symptoms and signs concerning food and fluid intake: Secondary | ICD-10-CM | POA: Diagnosis not present

## 2012-03-19 DIAGNOSIS — Z954 Presence of other heart-valve replacement: Secondary | ICD-10-CM | POA: Diagnosis not present

## 2012-03-19 DIAGNOSIS — M6281 Muscle weakness (generalized): Secondary | ICD-10-CM | POA: Diagnosis not present

## 2012-03-19 DIAGNOSIS — M25559 Pain in unspecified hip: Secondary | ICD-10-CM | POA: Diagnosis not present

## 2012-03-19 DIAGNOSIS — J069 Acute upper respiratory infection, unspecified: Secondary | ICD-10-CM | POA: Diagnosis not present

## 2012-03-19 DIAGNOSIS — A0472 Enterocolitis due to Clostridium difficile, not specified as recurrent: Secondary | ICD-10-CM | POA: Diagnosis not present

## 2012-03-24 ENCOUNTER — Encounter: Payer: Self-pay | Admitting: Internal Medicine

## 2012-03-24 DIAGNOSIS — E785 Hyperlipidemia, unspecified: Secondary | ICD-10-CM | POA: Insufficient documentation

## 2012-03-24 DIAGNOSIS — E559 Vitamin D deficiency, unspecified: Secondary | ICD-10-CM | POA: Insufficient documentation

## 2012-03-24 DIAGNOSIS — M810 Age-related osteoporosis without current pathological fracture: Secondary | ICD-10-CM | POA: Insufficient documentation

## 2012-04-14 DIAGNOSIS — Z0279 Encounter for issue of other medical certificate: Secondary | ICD-10-CM

## 2012-05-06 ENCOUNTER — Ambulatory Visit (INDEPENDENT_AMBULATORY_CARE_PROVIDER_SITE_OTHER): Payer: Medicare Other | Admitting: Internal Medicine

## 2012-05-06 ENCOUNTER — Encounter: Payer: Self-pay | Admitting: Internal Medicine

## 2012-05-06 VITALS — BP 98/62 | HR 85 | Temp 97.9°F | Ht <= 58 in | Wt 88.4 lb

## 2012-05-06 DIAGNOSIS — I340 Nonrheumatic mitral (valve) insufficiency: Secondary | ICD-10-CM

## 2012-05-06 DIAGNOSIS — I480 Paroxysmal atrial fibrillation: Secondary | ICD-10-CM

## 2012-05-06 DIAGNOSIS — E237 Disorder of pituitary gland, unspecified: Secondary | ICD-10-CM

## 2012-05-06 DIAGNOSIS — I059 Rheumatic mitral valve disease, unspecified: Secondary | ICD-10-CM | POA: Diagnosis not present

## 2012-05-06 DIAGNOSIS — G4700 Insomnia, unspecified: Secondary | ICD-10-CM | POA: Diagnosis not present

## 2012-05-06 DIAGNOSIS — G709 Myoneural disorder, unspecified: Secondary | ICD-10-CM

## 2012-05-06 DIAGNOSIS — I4891 Unspecified atrial fibrillation: Secondary | ICD-10-CM

## 2012-05-06 MED ORDER — TEMAZEPAM 15 MG PO CAPS: 15.0000 mg | ORAL_CAPSULE | Freq: Every evening | ORAL | Status: DC | PRN

## 2012-05-06 NOTE — Assessment & Plan Note (Signed)
NSR on amio - onset precipitated by critical illness and MVR 11/2011 ? eventual taper off amio and maintain on beta-blocker - ASA 81 qd, no anticoag mgmt of same per cards  

## 2012-05-06 NOTE — Progress Notes (Signed)
Subjective:    Patient ID: Michaela [name], female    DOB: [birth year], 56 y.o.   MRN: [id number]  HPI  Here for follow up - reviewed chronic medical issues:  Hx severe MR - s/p minimally invasive mitral valve repair in 10/2011 with Alfieri edge to edge repair and annuloplasty.  Echo post-op showed normal EF, no MR, and mild to moderate mitral stenosis. No edema or shortness of breath  severe C difficile colitis 11/2011.  septic shock, probable aspiration and ARDS-like picture.  denies recurrent shortness of breath, diarrhea but wants to avoid recrrent C diff "at any cost" - take probiotics, yogurt and Kefir regularly  atrial fibrillation with RVR during sepsis hosp 11/2011. 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 At baseline, patient has poor balance from ataxia and can walk limited distances with a walker but typically uses her wheelchair.  She had been living independently with help from family before 10/2011, [facility] aide 3x/week, currently 24/7 care since DC from SNF 01/17/12 [facility], aide and family)   Past Medical History  Diagnosis Date  . Astrocytoma brain tumor 1971    astrocytoma treated with radiation  . Mitral regurgitation 10/2011    severe, s/p min invasive repair and annuloplasty  . Neuromuscular disorder     numbness in hands and feet  . Osteoarthritis   . Overactive bladder   . Pleural effusion, left 11/11/2011  . C. difficile colitis 11/2011    complicated by sepsis/ARDS and prolonged hosp  . Vitamin d deficiency   . Osteopenia   . Hyperlipidemia     Review of Systems  Constitutional: Negative for fever or weight change.  Respiratory: Negative for cough and shortness of  breath. GU - freq nocturia and urinary incontinence  - Neurological: Negative for dizziness or headache.      Objective:   Physical Exam  BP 98/62  Pulse 85  Temp 97.9 F (36.6 C) (Oral)  Ht 4\' 10"  (1.473 m)  Wt 88 lb 6.4 oz (40.098 kg)  BMI 18.48 kg/m2  SpO2 96% Constitutional: She is small statured, in WC. No distress. mom at side Neck: Normal range of motion. Neck supple. No JVD or LAD present. No thyromegaly present.  Cardiovascular: Normal rate, regular rhythm and normal heart sounds.  No murmur heard. No BLE edema. Pulmonary/Chest: Effort normal and breath sounds normal. No respiratory distress. She has no wheezes.  Musculoskeletal:  No gross deformities. Mod muscle mass atrophy of BLE. Neurological: She is alert and oriented to person, place, and time. No cranial nerve deficit. Ataxic BUE, slow and deliberate voice-pattern. Gait not tested  Psychiatric: She has a normal mood and affect. Her behavior is normal. Judgment and thought content normal.   Lab Results  Component Value Date   WBC 11.1* 12/22/2011   HGB 9.7* 12/22/2011   HCT 31.2* 12/22/2011   PLT 137* 12/22/2011   GLUCOSE 78 02/11/2012   ALT 8 02/11/2012   AST 13 02/11/2012   NA 141 02/11/2012   K 4.1 02/11/2012   CL 106 02/11/2012   CREATININE 0.5 02/11/2012   BUN 20 02/11/2012   CO2 29 02/11/2012   TSH 1.57 02/11/2012   INR 1.29 12/05/2011   HGBA1C  5.5 10/11/2011      Assessment & Plan:  See problem list. Medications and labs reviewed today.  Insomnia - hx night terrors - ineffective relief with Ambien - requests temazepam as good relief for same in past  Time spent with pt/family today 25 minutes, greater than 50% time spent counseling patient on AF, MVR, night terror hx and medication review. Also review of prior records

## 2012-05-06 NOTE — Assessment & Plan Note (Signed)
Off [facility] after endo eval 02/2012 follow up as needed

## 2012-05-06 NOTE — Patient Instructions (Signed)
It was good to see you today. We have reviewed your prior records including labs and tests today Resume temazepam for sleep -Other Medications reviewed, no changes at this time. Please schedule followup in 6 months, call sooner if problems.

## 2012-05-06 NOTE — Assessment & Plan Note (Signed)
Chronic but progressive ataxia Baseline able to self transfer and take few steps with walker but debilitated by severe medical illness spring 2013 Pt/family deny "neuro dz" dx beyond remote astrocytoma resection and XRT in 1971 -  ?cervical myelopathy from prior XRT or other consider eval by neuro for same if change in symptoms, but stable at this time 

## 2012-05-06 NOTE — Assessment & Plan Note (Signed)
MVR 11/2011 with Alfieri technique - Recent Echo without MR - follows with cards for same The current medical regimen is effective;  continue present plan and medications.

## 2012-06-11 ENCOUNTER — Other Ambulatory Visit: Payer: Self-pay | Admitting: Cardiology

## 2012-06-11 MED ORDER — AMIODARONE HCL 200 MG PO TABS: 100.0000 mg | ORAL_TABLET | Freq: Every day | ORAL | Status: DC

## 2012-06-11 MED ORDER — METOPROLOL SUCCINATE ER 25 MG PO TB24: 12.5000 mg | ORAL_TABLET | Freq: Every day | ORAL | Status: DC

## 2012-06-12 ENCOUNTER — Other Ambulatory Visit: Payer: Self-pay | Admitting: Cardiology

## 2012-06-12 MED ORDER — AMIODARONE HCL 200 MG PO TABS: 100.0000 mg | ORAL_TABLET | Freq: Every day | ORAL | Status: DC

## 2012-06-12 MED ORDER — METOPROLOL SUCCINATE ER 25 MG PO TB24: 12.5000 mg | ORAL_TABLET | Freq: Every day | ORAL | Status: DC

## 2012-07-22 ENCOUNTER — Ambulatory Visit: Payer: Medicare Other

## 2012-07-22 ENCOUNTER — Telehealth: Payer: Self-pay | Admitting: Internal Medicine

## 2012-07-22 NOTE — Telephone Encounter (Signed)
Caller: Kimberly/Sibling; Patient Name: Michaela [name]; PCP: Rene Paci; Best Callback Phone Number: [phone].  Sibling needs pneumonia shot and flu shot, and family is not sure how to go about get it done.  Caller transferred to office for scheduling assistance.

## 2012-07-22 NOTE — Telephone Encounter (Signed)
Called Michaela [name] back pt had pneumonia shot 11/23/11 while she was in hosp does not need another one until 5 years. Made appt for flu shot this afternoon...Raechel Chute

## 2012-08-11 ENCOUNTER — Ambulatory Visit (INDEPENDENT_AMBULATORY_CARE_PROVIDER_SITE_OTHER): Payer: Medicare Other

## 2012-08-11 DIAGNOSIS — Z23 Encounter for immunization: Secondary | ICD-10-CM

## 2012-08-24 ENCOUNTER — Telehealth: Payer: Self-pay | Admitting: Internal Medicine

## 2012-08-24 NOTE — Telephone Encounter (Signed)
Sister, Selena Batten and the POA calling.  She had a dental appointment today with Dr. Victoriano Lain at [facility].  They refused to clean her teeth without her being on prophylactic antibiotics. She rescheduled her appointment for 09/16/12.    Selena Batten can be reached Tuesday at [phone].

## 2012-08-25 DIAGNOSIS — H5000 Unspecified esotropia: Secondary | ICD-10-CM | POA: Diagnosis not present

## 2012-08-25 DIAGNOSIS — H5500 Unspecified nystagmus: Secondary | ICD-10-CM | POA: Diagnosis not present

## 2012-08-25 DIAGNOSIS — H2510 Age-related nuclear cataract, unspecified eye: Secondary | ICD-10-CM | POA: Diagnosis not present

## 2012-08-25 DIAGNOSIS — H524 Presbyopia: Secondary | ICD-10-CM | POA: Diagnosis not present

## 2012-08-25 MED ORDER — AMOXICILLIN 500 MG PO CAPS: 2000.0000 mg | ORAL_CAPSULE | Freq: Once | ORAL | Status: DC

## 2012-08-25 NOTE — Telephone Encounter (Signed)
Take Amoxicillin 2g (4-500mg  caps) at one time 1 hour before dental procedure - erx done + 1 refill to use as needed

## 2012-08-25 NOTE — Telephone Encounter (Signed)
Called sister (kim) no answer LMOM antibiotic sent to [facility]...Michaela [name]

## 2012-09-14 ENCOUNTER — Other Ambulatory Visit: Payer: Self-pay | Admitting: *Deleted

## 2012-09-14 ENCOUNTER — Telehealth: Payer: Self-pay | Admitting: *Deleted

## 2012-09-14 MED ORDER — TEMAZEPAM 15 MG PO CAPS: 15.0000 mg | ORAL_CAPSULE | Freq: Every evening | ORAL | Status: DC | PRN

## 2012-09-14 MED ORDER — TOLTERODINE TARTRATE ER 4 MG PO CP24: 4.0000 mg | ORAL_CAPSULE | Freq: Every day | ORAL | Status: DC

## 2012-09-14 NOTE — Telephone Encounter (Signed)
Faxed script back to [facility]...Raechel Chute

## 2012-09-14 NOTE — Telephone Encounter (Signed)
Sent refill on detrol LA/lmb

## 2012-11-04 ENCOUNTER — Ambulatory Visit: Payer: Medicare Other | Admitting: Internal Medicine

## 2012-11-07 LAB — HM DEXA SCAN

## 2012-11-11 ENCOUNTER — Encounter: Payer: Self-pay | Admitting: Internal Medicine

## 2012-11-11 ENCOUNTER — Ambulatory Visit (INDEPENDENT_AMBULATORY_CARE_PROVIDER_SITE_OTHER): Payer: Medicare Other | Admitting: Internal Medicine

## 2012-11-11 ENCOUNTER — Other Ambulatory Visit (INDEPENDENT_AMBULATORY_CARE_PROVIDER_SITE_OTHER): Payer: Medicare Other

## 2012-11-11 ENCOUNTER — Ambulatory Visit (INDEPENDENT_AMBULATORY_CARE_PROVIDER_SITE_OTHER)
Admission: RE | Admit: 2012-11-11 | Discharge: 2012-11-11 | Disposition: A | Discharge status: Home / Self Care | Payer: Medicare Other | Source: Ambulatory Visit | Attending: Internal Medicine | Admitting: Internal Medicine

## 2012-11-11 VITALS — BP 100/72 | HR 78 | Temp 96.9°F | Wt 89.0 lb

## 2012-11-11 DIAGNOSIS — I480 Paroxysmal atrial fibrillation: Secondary | ICD-10-CM

## 2012-11-11 DIAGNOSIS — D649 Anemia, unspecified: Secondary | ICD-10-CM

## 2012-11-11 DIAGNOSIS — N319 Neuromuscular dysfunction of bladder, unspecified: Secondary | ICD-10-CM

## 2012-11-11 DIAGNOSIS — Z13820 Encounter for screening for osteoporosis: Secondary | ICD-10-CM | POA: Diagnosis not present

## 2012-11-11 DIAGNOSIS — Z1211 Encounter for screening for malignant neoplasm of colon: Secondary | ICD-10-CM

## 2012-11-11 DIAGNOSIS — Z0000 Encounter for general adult medical examination without abnormal findings: Secondary | ICD-10-CM

## 2012-11-11 DIAGNOSIS — I4891 Unspecified atrial fibrillation: Secondary | ICD-10-CM

## 2012-11-11 DIAGNOSIS — Z1239 Encounter for other screening for malignant neoplasm of breast: Secondary | ICD-10-CM

## 2012-11-11 LAB — CBC WITH DIFFERENTIAL/PLATELET
Basophils Absolute: 0 10*3/uL (ref 0.0–0.1)
Basophils Relative: 0.1 % (ref 0.0–3.0)
Eosinophils Absolute: 0.1 10*3/uL (ref 0.0–0.7)
Lymphocytes Relative: 20.9 % (ref 12.0–46.0)
MCHC: 33.6 g/dL (ref 30.0–36.0)
MCV: 89.8 fl (ref 78.0–100.0)
Monocytes Absolute: 0.5 10*3/uL (ref 0.1–1.0)
Neutrophils Relative %: 69.9 % (ref 43.0–77.0)
RBC: 4.35 Mil/uL (ref 3.87–5.11)
RDW: 13.5 % (ref 11.5–14.6)

## 2012-11-11 LAB — URINALYSIS, ROUTINE W REFLEX MICROSCOPIC
Bilirubin Urine: NEGATIVE
Ketones, ur: NEGATIVE
Leukocytes, UA: NEGATIVE
Specific Gravity, Urine: 1.025 (ref 1.000–1.030)
Urine Glucose: NEGATIVE
pH: 6 (ref 5.0–8.0)

## 2012-11-11 LAB — FERRITIN: Ferritin: 239.1 ng/mL (ref 10.0–291.0)

## 2012-11-11 MED ORDER — SOLIFENACIN SUCCINATE 10 MG PO TABS: 10.0000 mg | ORAL_TABLET | Freq: Every day | ORAL | Status: DC

## 2012-11-11 MED ORDER — TEMAZEPAM 7.5 MG PO CAPS: 7.5000 mg | ORAL_CAPSULE | Freq: Every evening | ORAL | Status: DC | PRN

## 2012-11-11 NOTE — Assessment & Plan Note (Signed)
Resumed detrol as prior to acute illness 01/2012 Constant urge unrelieved by meds Remote urology evaluation by Dr. Wanda Plump unremarkable per patient report Check urine sample now Change Detrol to Vesicare Reconsider need for new urology referral depending on symptoms and response to medication

## 2012-11-11 NOTE — Progress Notes (Signed)
Subjective:    Patient ID: Michaela [name], female    DOB: 09-04-1956, 57 y.o.   MRN: [id number]  HPI   Here for medicare wellness  Diet: heart healthy Physical activity: sedentary Depression/mood screen: negative Hearing: intact to whispered voice Visual acuity: grossly normal ADLs: capable, baseline Fall risk: baseline, controlled Home safety: good Cognitive evaluation: intact to orientation, naming, recall and repetition EOL planning: adv directives, full code/ I agree  I have personally reviewed and have noted 1. The patient's medical and social history 2. Their use of alcohol, tobacco or illicit drugs 3. Their current medications and supplements 4. The patient's functional ability including ADL's, fall risks, home safety risks and hearing or visual impairment. 5. Diet and physical activities 6. Evidence for depression or mood disorders   also reviewed chronic medical issues Has questions re: meds for sleep & bladder  Past Medical History  Diagnosis Date  . Astrocytoma brain tumor 1971    astrocytoma treated with radiation  . Mitral regurgitation 10/2011    severe, s/p min invasive repair and annuloplasty  . Neuromuscular disorder     numbness in hands and feet  . Osteoarthritis   . Overactive bladder   . Pleural effusion, left 11/11/2011  . C. difficile colitis 11/2011    complicated by sepsis/ARDS and prolonged hosp  . Vitamin d deficiency   . Osteopenia   . Hyperlipidemia    Family History  Problem Relation Age of Onset  . Coronary artery disease Father 40    died MI   History  Substance Use Topics  . Smoking status: Former Smoker    Quit date: 09/09/1990  . Smokeless tobacco: Not on file  . Alcohol Use: Yes    Review of Systems  Constitutional: Negative for fever or weight change.  Respiratory: Negative for cough and shortness of breath. GU - freq nocturia and urinary incontinence, unimproved with detrol  - Neurological: Negative for  dizziness or headache.  Other Systems reviewed and negative except as listed above in history of present illness    Objective:   Physical Exam  BP 100/72  Pulse 78  Temp(Src) 96.9 F (36.1 C) (Oral)  Wt 89 lb (40.37 kg)  BMI 18.61 kg/m2  SpO2 93% Wt Readings from Last 3 Encounters:  11/11/12 89 lb (40.37 kg)  05/06/12 88 lb 6.4 oz (40.098 kg)  03/03/12 86 lb (39.009 kg)   Constitutional: She is small statured, in WC. No distress. family at side Neck: Normal range of motion. Neck supple. No JVD or LAD present. No thyromegaly present.  Cardiovascular: Normal rate, regular rhythm and normal heart sounds.  No murmur heard. No BLE edema. Pulmonary/Chest: Effort normal and breath sounds normal. No respiratory distress. She has no wheezes.  Musculoskeletal:  No gross deformities. Mod muscle mass atrophy of BLE. Neurological: She is alert and oriented to person, place, and time. No cranial nerve deficit. Ataxic BUE, slow and deliberate voice-pattern. Gait not tested  Psychiatric: She has a normal mood and affect. Her behavior is normal. Judgment and thought content normal.   Lab Results  Component Value Date   WBC 11.1* 12/22/2011   HGB 9.7* 12/22/2011   HCT 31.2* 12/22/2011   PLT 137* 12/22/2011   GLUCOSE 78 02/11/2012   ALT 8 02/11/2012   AST 13 02/11/2012   NA 141 02/11/2012   K 4.1 02/11/2012   CL 106 02/11/2012   CREATININE 0.5 02/11/2012   BUN 20 02/11/2012   CO2 29 02/11/2012  TSH 1.57 02/11/2012   INR 1.29 12/05/2011   HGBA1C 5.5 10/11/2011      Assessment & Plan:  See problem list. Medications and labs reviewed today.  Insomnia, chronic - hx night terrors - ineffective relief with Ambien, but now reports oversedation with temazepam - reduce dose now as good relief for same in past

## 2012-11-11 NOTE — Assessment & Plan Note (Signed)
Hx iron defic, exac by acute illness 12/2011 hosp Check cbc and ferritin now Refer to GI for eval of same (over due for screening colo age >27)

## 2012-11-11 NOTE — Patient Instructions (Signed)
It was good to see you today. We have reviewed your prior records including labs and tests today Test(s) ordered today -labs and bone density. Your results will be released to MyChart (or called to you) after review, usually within 72hours after test completion. If any changes need to be made, you will be notified at that same time. Change dose temazepam as needed for sleep - Stop Detrol and begin Vesicare for bladder symptoms Other Medications reviewed, no changes at this time. Your prescription(s) have been submitted to your pharmacy. Please take as directed and contact our office if you believe you are having problem(s) with the medication(s). we'll make referral to Colonoscopy screening and for mammogram. Our office will contact you regarding appointment(s) once made. Please schedule followup in 6 months, call sooner if problems.

## 2012-11-11 NOTE — Assessment & Plan Note (Signed)
NSR on amio - onset precipitated by critical illness and MVR 11/2011 ? eventual taper off amio and maintain on beta-blocker - ASA 81 qd, no anticoag mgmt of same per cards

## 2013-03-18 ENCOUNTER — Telehealth: Payer: Self-pay | Admitting: *Deleted

## 2013-03-18 NOTE — Telephone Encounter (Signed)
Left msg on vm stating she is sister POA someone called her and told her she has osteoporosis. Wanting to know what md want her to do. Called [name] back no answer LMOM sister need to make appt to see md to discuss treatment for the osteoporosis...Raechel Chute

## 2013-03-22 ENCOUNTER — Encounter: Payer: Self-pay | Admitting: Internal Medicine

## 2013-03-22 ENCOUNTER — Ambulatory Visit (INDEPENDENT_AMBULATORY_CARE_PROVIDER_SITE_OTHER): Payer: Medicare Other | Admitting: Internal Medicine

## 2013-03-22 VITALS — BP 90/62 | HR 74 | Temp 98.5°F | Wt 85.0 lb

## 2013-03-22 DIAGNOSIS — M810 Age-related osteoporosis without current pathological fracture: Secondary | ICD-10-CM | POA: Diagnosis not present

## 2013-03-22 DIAGNOSIS — G709 Myoneural disorder, unspecified: Secondary | ICD-10-CM | POA: Diagnosis not present

## 2013-03-22 DIAGNOSIS — E559 Vitamin D deficiency, unspecified: Secondary | ICD-10-CM

## 2013-03-22 MED ORDER — DENOSUMAB 60 MG/ML ~~LOC~~ SOLN: 60.0000 mg | SUBCUTANEOUS | Status: DC

## 2013-03-22 NOTE — Assessment & Plan Note (Signed)
Intolerant of rx Vit D in past Encouraged again to comply with OTC recommendations, esp in setting of progressive bone loss on DEXA

## 2013-03-22 NOTE — Assessment & Plan Note (Addendum)
Progression despite Reclast x 2 - last administration ?2011 recommended Vit D and Ca and start Prolia q 65mo No prior fracture on hx Recheck DEXA q29mo

## 2013-03-22 NOTE — Patient Instructions (Addendum)
It was good to see you today. We have reviewed your prior records including labs and tests today Will begin Prolia every 6 months - my office will call you once this is arranged for your 1st injection Calcium 1200-2000mg  daily and Vit D 1000-2000 units each day recommended Osteoporosis and Fractures Osteoporosis is a disease of the bones that makes them weaker and prone to break (fracture). By their mid-30s, most people begin to gradually lose bone strength. If this is severe enough, osteoporosis may occur. Bone fractures from osteoporosis (especially hip and spine fractures) are a major cause of hospitalization, loss of independence, and can lead to life-threatening complications. Your goals are to prevent falls and to prevent or treat weakening bones. ROLE OF FALLS Fractures occur in only a small percentage of older persons who fall, even though at least 30% to 40% of elderly people have osteoporosis. However, a fall of just a few feet may cause a fracture in a person with low bone density. FALL PREVENTION  If you are unsteady on your feet, use a cane, walker, or walk with someone's help.   Remove loose rugs or electrical cords from your home.   Keep your home well lit at night. Use glasses if you need them.   Avoid icy streets and wet or waxed floors.   Hold the railing when using stairs.   Watch out for your pets.   Install grab bars in your bathroom.   Exercise. Physical activity, especially weight-bearing exercise, helps strengthen bones. Strength and balance exercise, such as tai chi, helps prevent falls.   Alcohol and some medicines can make you more likely to fall. Discuss alcohol use with your caregiver. Ask your caregiver if any of your medicines might increase your risk for falling. Ask if safer alternatives are available.  HOME CARE INSTRUCTIONS   Try to prevent and avoid falls.   To pick up objects, bend at the knees. Do not bend with your back.   Do not smoke. If you  smoke, ask for help to stop.   Have adequate calcium and vitamin D in your diet. Talk with your caregiver about amounts.   Before exercising, ask your caregiver what exercises will be good for you.   Only take over-the-counter or prescription medicines for pain, discomfort, or fever as directed by your caregiver.   Some medical conditions (menopause, kidney disease, steroid treatment) can weaken your bones. Talk about bone health with your caregiver. Discuss any medicines you take and whether they can weaken your bones.   If you have osteoporosis or osteopenia, discuss treatment options with your caregiver.  SEEK MEDICAL CARE IF:   You have had a fracture and your pain is not controlled.   You have had a fracture and you are not able to return to activities as expected.   You are reinjured.   You develop side effects from medicines, especially stomach pain or trouble swallowing.   You develop new, unexplained problems.  SEEK IMMEDIATE MEDICAL CARE IF:   You develop sudden, severe pain in your back.   You develop pain after an injury or fall.  Document Released: 07/19/2004 Document Revised: 08/15/2011 Document Reviewed: 12/27/2009 [facility] Patient Information 2012 [place], [place].

## 2013-03-22 NOTE — Assessment & Plan Note (Signed)
Chronic but progressive ataxia Baseline able to self transfer and take few steps with walker but debilitated by severe medical illness spring 2013 Pt/family deny "neuro dz" dx beyond remote astrocytoma resection and XRT in 1971 -  ?cervical myelopathy from prior XRT or other consider eval by neuro for same if change in symptoms, but stable at this time 

## 2013-03-22 NOTE — Progress Notes (Signed)
  Subjective:    Patient ID: Michaela [name], female    DOB: [birth year], 57 y.o.   MRN: [id number]  HPI   Here to review DEXA and tx options for osteoporosis  Past Medical History  Diagnosis Date  . Astrocytoma brain tumor 1971    astrocytoma treated with radiation  . Mitral regurgitation 10/2011    severe, s/p min invasive repair and annuloplasty  . Neuromuscular disorder     numbness in hands and feet  . Osteoarthritis   . Overactive bladder   . Pleural effusion, left 11/11/2011  . C. difficile colitis 11/2011    complicated by sepsis/ARDS and prolonged hosp  . Vitamin D deficiency   . Osteoporosis     DEXA @ [facility] 11/2012: -3.5, prior tx Reclast, last in 2011, rec change to Prolia  . Hyperlipidemia     Review of Systems  Constitutional: Negative for fever or weight change.  Respiratory: Negative for cough and shortness of breath. Neurological: Negative for dizziness or headache.       Objective:   Physical Exam  BP 90/62  Pulse 74  Temp(Src) 98.5 F (36.9 C) (Oral)  Wt 85 lb (38.556 kg)  BMI 17.77 kg/m2  SpO2 95% Wt Readings from Last 3 Encounters:  03/22/13 85 lb (38.556 kg)  11/11/12 89 lb (40.37 kg)  05/06/12 88 lb 6.4 oz (40.098 kg)   Constitutional: She is small statured, in WC. No distress. family at side Neck: Normal range of motion. Neck supple. No JVD or LAD present. No thyromegaly present.  Cardiovascular: Normal rate, regular rhythm and normal heart sounds.  No murmur heard. No BLE edema. Pulmonary/Chest: Effort normal and breath sounds normal. No respiratory distress. She has no wheezes.  Musculoskeletal:  No gross deformities. Mod muscle mass atrophy of BLE. Neurological: She is alert and oriented to person, place, and time. No cranial nerve deficit. Ataxic BUE, slow and deliberate voice-pattern. Gait not tested  Psychiatric: She has a normal mood and affect. Her behavior is normal. Judgment and thought content normal.   Lab Results  Component  Value Date   WBC 6.6 11/11/2012   HGB 13.1 11/11/2012   HCT 39.0 11/11/2012   PLT 199.0 11/11/2012   GLUCOSE 78 02/11/2012   ALT 8 02/11/2012   AST 13 02/11/2012   NA 141 02/11/2012   K 4.1 02/11/2012   CL 106 02/11/2012   CREATININE 0.5 02/11/2012   BUN 20 02/11/2012   CO2 29 02/11/2012   TSH 1.57 02/11/2012   INR 1.29 12/05/2011   HGBA1C 5.5 10/11/2011      Assessment & Plan:  See problem list. Medications and labs reviewed today.

## 2013-03-25 ENCOUNTER — Telehealth: Payer: Self-pay | Admitting: *Deleted

## 2013-03-25 NOTE — Telephone Encounter (Signed)
Message copied by [name] on Thu Mar 25, 2013  9:01 AM ------      Message from: [name]      Created: Thu Mar 25, 2013  8:31 AM      Regarding: RE: [name],            Hope you're doing ok.  [name] told me [name] is leaving tomorrow.  I'm happy for her.        Michaela [name] h/b approved for her Prolia inj.  Can you contact her to set up the appt (give [name] about 3 days to order it please).  She h/Medicare & Medicaid & they both have indicated that they will pay on it.  For now, the patient can wait for them to pay, then if there's a balance she will be billed.        If she agrees to have the inj, please let me know when she'll be in.            Thanks so much,      [name]            ----- Message -----         From: [name], MA         Sent: 03/22/2013   4:45 PM           To: [name]      Subject: [name] Mrs. [name] hope you are doing well. Here is another pt that is wanting to get the prolia. Let me know if i need to do anything!            Thanks [name]       ------

## 2013-03-25 NOTE — Telephone Encounter (Signed)
Called pt sister kim [facility]) to set up prolia injection. No answer LMOM RTC...Raechel Chute

## 2013-03-26 NOTE — Telephone Encounter (Signed)
Called pt & sister (kim) again still no answer LMOM RTC...lmb

## 2013-03-30 NOTE — Telephone Encounter (Signed)
Sister Selena Batten) return call she states they have already pick up med (prolia) at the pharmacy already. Per chart md sent rx by mistake. Pt will bring med for nurse visit. Transferred to schedulers to make appt,,,,lmb

## 2013-03-30 NOTE — Telephone Encounter (Signed)
Pt sister Selena Batten) called back left msg for me to give her a call. Called pt back no answer LMOM RTC...Raechel Chute

## 2013-04-01 ENCOUNTER — Ambulatory Visit (INDEPENDENT_AMBULATORY_CARE_PROVIDER_SITE_OTHER): Payer: Medicare Other | Admitting: *Deleted

## 2013-04-01 DIAGNOSIS — G709 Myoneural disorder, unspecified: Secondary | ICD-10-CM

## 2013-04-01 DIAGNOSIS — M810 Age-related osteoporosis without current pathological fracture: Secondary | ICD-10-CM

## 2013-04-01 MED ORDER — DENOSUMAB 60 MG/ML ~~LOC~~ SOLN
60.0000 mg | Freq: Once | SUBCUTANEOUS | Status: AC
  Administered 2013-04-01: 60 mg via SUBCUTANEOUS

## 2013-05-14 ENCOUNTER — Encounter: Payer: Self-pay | Admitting: Internal Medicine

## 2013-05-14 ENCOUNTER — Other Ambulatory Visit (INDEPENDENT_AMBULATORY_CARE_PROVIDER_SITE_OTHER): Payer: Medicare Other

## 2013-05-14 ENCOUNTER — Ambulatory Visit (INDEPENDENT_AMBULATORY_CARE_PROVIDER_SITE_OTHER): Payer: Medicare Other | Admitting: Internal Medicine

## 2013-05-14 VITALS — BP 90/58 | HR 72 | Temp 98.1°F | Wt 84.0 lb

## 2013-05-14 DIAGNOSIS — E785 Hyperlipidemia, unspecified: Secondary | ICD-10-CM

## 2013-05-14 DIAGNOSIS — Z23 Encounter for immunization: Secondary | ICD-10-CM

## 2013-05-14 DIAGNOSIS — Z79899 Other long term (current) drug therapy: Secondary | ICD-10-CM | POA: Diagnosis not present

## 2013-05-14 DIAGNOSIS — I4891 Unspecified atrial fibrillation: Secondary | ICD-10-CM | POA: Diagnosis not present

## 2013-05-14 DIAGNOSIS — N319 Neuromuscular dysfunction of bladder, unspecified: Secondary | ICD-10-CM

## 2013-05-14 DIAGNOSIS — M810 Age-related osteoporosis without current pathological fracture: Secondary | ICD-10-CM

## 2013-05-14 DIAGNOSIS — I480 Paroxysmal atrial fibrillation: Secondary | ICD-10-CM

## 2013-05-14 LAB — CBC WITH DIFFERENTIAL/PLATELET
Basophils Absolute: 0 10*3/uL (ref 0.0–0.1)
Basophils Relative: 0.4 % (ref 0.0–3.0)
Eosinophils Absolute: 0.1 10*3/uL (ref 0.0–0.7)
Lymphocytes Relative: 28.5 % (ref 12.0–46.0)
MCHC: 33.6 g/dL (ref 30.0–36.0)
MCV: 89.9 fl (ref 78.0–100.0)
Monocytes Absolute: 0.5 10*3/uL (ref 0.1–1.0)
Neutrophils Relative %: 61.9 % (ref 43.0–77.0)
Platelets: 213 10*3/uL (ref 150.0–400.0)
RBC: 4.54 Mil/uL (ref 3.87–5.11)
RDW: 13.6 % (ref 11.5–14.6)

## 2013-05-14 LAB — TSH: TSH: 2.59 u[IU]/mL (ref 0.35–5.50)

## 2013-05-14 LAB — LIPID PANEL
Cholesterol: 211 mg/dL — ABNORMAL HIGH (ref 0–200)
Total CHOL/HDL Ratio: 5
VLDL: 9.4 mg/dL (ref 0.0–40.0)

## 2013-05-14 LAB — HEPATIC FUNCTION PANEL
AST: 21 U/L (ref 0–37)
Albumin: 4.1 g/dL (ref 3.5–5.2)
Alkaline Phosphatase: 44 U/L (ref 39–117)
Bilirubin, Direct: 0.1 mg/dL (ref 0.0–0.3)

## 2013-05-14 LAB — BASIC METABOLIC PANEL
BUN: 19 mg/dL (ref 6–23)
CO2: 31 mEq/L (ref 19–32)
Calcium: 9.4 mg/dL (ref 8.4–10.5)
Chloride: 100 mEq/L (ref 96–112)
Creatinine, Ser: 0.7 mg/dL (ref 0.4–1.2)

## 2013-05-14 LAB — LDL CHOLESTEROL, DIRECT: Direct LDL: 165 mg/dL

## 2013-05-14 MED ORDER — POLYETHYLENE GLYCOL 3350 17 GM/SCOOP PO POWD: 17.0000 g | Freq: Every day | ORAL | Status: DC

## 2013-05-14 NOTE — Assessment & Plan Note (Signed)
Progression despite Reclast x 2 - last administration 2011 started Prolia 04/2013 -repeat q 27mo No prior fracture on hx Continue calcium plus vitamin D Recheck DEXA q8mo

## 2013-05-14 NOTE — Assessment & Plan Note (Signed)
NSR on amio - onset precipitated by critical illness and MVR 11/2011 ? eventual taper off amio and maintain on beta-blocker - ASA 81 qd, no anticoag mgmt of same per cards We'll help arrange annual followup with cardiology, last office visit June 2013 reviewed

## 2013-05-14 NOTE — Patient Instructions (Signed)
It was good to see you today. We have reviewed your prior records including labs and tests today Medications reviewed and updated, no changes recommended at this time. Test(s) ordered today. Your results will be released to MyChart (or called to you) after review, usually within 72hours after test completion. If any changes need to be made, you will be notified at that same time. Please schedule followup in 6 months, call sooner if problems. we'll make referral to get appointment with Dr Shirlee Latch . Our office will contact you regarding appointment(s) once made.

## 2013-05-14 NOTE — Assessment & Plan Note (Signed)
Check lipids now consider statin given AF hx - defer to cards

## 2013-05-14 NOTE — Progress Notes (Signed)
  Subjective:    Patient ID: Michaela [name], female    DOB: 21-May-1956, 57 y.o.   MRN: [id number]  HPI  Here for follow up  - reviewed chronic medical issues interval medical events  Overall feels well  Continued symptoms of overactive bladder, improved with Vesicare Also questions over-the-counter options for treatment of chronic constipation  Past Medical History  Diagnosis Date  . Astrocytoma brain tumor 1971    astrocytoma treated with radiation  . Mitral regurgitation 10/2011    severe, s/p min invasive repair and annuloplasty  . Neuromuscular disorder     numbness in hands and feet  . Osteoarthritis   . Overactive bladder   . Pleural effusion, left 11/11/2011  . C. difficile colitis 11/2011    complicated by sepsis/ARDS and prolonged hosp  . Vitamin D deficiency   . Osteoporosis     DEXA @ [facility] 11/2012: -3.5, prior tx Reclast, last in 2011, rec change to Prolia  . Hyperlipidemia     Review of Systems  Constitutional: Negative for fever, fatigue and unexpected weight change.  Respiratory: Negative for cough and shortness of breath.   Cardiovascular: Negative for chest pain and palpitations.       Objective:   Physical Exam BP 90/58  Pulse 72  Temp(Src) 98.1 F (36.7 C) (Oral)  Wt 84 lb (38.102 kg)  BMI 17.56 kg/m2  SpO2 94% Wt Readings from Last 3 Encounters:  05/14/13 84 lb (38.102 kg)  03/22/13 85 lb (38.556 kg)  11/11/12 89 lb (40.37 kg)   Constitutional: She is small statured, in WC. No distress. family at side Neck: Normal range of motion. Neck supple. No JVD or LAD present. No thyromegaly present.  Cardiovascular: Normal rate, regular rhythm and normal heart sounds.  No murmur heard. No BLE edema. Pulmonary/Chest: Effort normal and breath sounds normal. No respiratory distress. She has no wheezes.  Musculoskeletal:  No gross deformities. Mod muscle mass atrophy of BLE. Neurological: She is alert and oriented to person, place, and time. No cranial nerve  deficit. Ataxic BUE, slow and deliberate voice-pattern. Gait not tested  Psychiatric: She has a normal mood and affect. Her behavior is normal. Judgment and thought content normal.   Lab Results  Component Value Date   WBC 6.6 11/11/2012   HGB 13.1 11/11/2012   HCT 39.0 11/11/2012   PLT 199.0 11/11/2012   GLUCOSE 78 02/11/2012   ALT 8 02/11/2012   AST 13 02/11/2012   NA 141 02/11/2012   K 4.1 02/11/2012   CL 106 02/11/2012   CREATININE 0.5 02/11/2012   BUN 20 02/11/2012   CO2 29 02/11/2012   TSH 1.57 02/11/2012   INR 1.29 12/05/2011   HGBA1C 5.5 10/11/2011      Assessment & Plan:   See problem list. Medications and labs reviewed today.

## 2013-05-14 NOTE — Assessment & Plan Note (Signed)
On detrol prior to acute illness and hosp 01/2012 Remote urology evaluation by Dr. Wanda Plump unremarkable per patient report Check urine sample now Changed Detrol to [facility] 11/2012 Improved but not resolved, continue same Reconsider need for new urology referral depending on future symptoms and response to medication

## 2013-06-07 ENCOUNTER — Other Ambulatory Visit: Payer: Self-pay | Admitting: Cardiology

## 2013-06-17 ENCOUNTER — Telehealth: Payer: Self-pay | Admitting: Cardiology

## 2013-06-17 NOTE — Telephone Encounter (Signed)
Pt has copy of pictures taken during valve surgery and would like for Dr Shirlee Latch to review when she comes for office visit 06/21/13.

## 2013-06-17 NOTE — Telephone Encounter (Signed)
New problem:  Pt states she wants the "pictures of her heart from her surgery" at her appt... So she can discuss them with the doctor. Please call back with any questions

## 2013-06-21 ENCOUNTER — Ambulatory Visit (INDEPENDENT_AMBULATORY_CARE_PROVIDER_SITE_OTHER): Payer: Medicare Other | Admitting: Cardiology

## 2013-06-21 ENCOUNTER — Encounter: Payer: Self-pay | Admitting: Cardiology

## 2013-06-21 ENCOUNTER — Other Ambulatory Visit: Payer: Self-pay | Admitting: Cardiology

## 2013-06-21 VITALS — BP 80/60 | HR 75 | Ht 59.0 in | Wt 80.0 lb

## 2013-06-21 DIAGNOSIS — I5032 Chronic diastolic (congestive) heart failure: Secondary | ICD-10-CM | POA: Diagnosis not present

## 2013-06-21 DIAGNOSIS — I480 Paroxysmal atrial fibrillation: Secondary | ICD-10-CM

## 2013-06-21 DIAGNOSIS — E785 Hyperlipidemia, unspecified: Secondary | ICD-10-CM

## 2013-06-21 DIAGNOSIS — I340 Nonrheumatic mitral (valve) insufficiency: Secondary | ICD-10-CM

## 2013-06-21 DIAGNOSIS — I059 Rheumatic mitral valve disease, unspecified: Secondary | ICD-10-CM | POA: Diagnosis not present

## 2013-06-21 DIAGNOSIS — I4891 Unspecified atrial fibrillation: Secondary | ICD-10-CM | POA: Diagnosis not present

## 2013-06-21 DIAGNOSIS — I509 Heart failure, unspecified: Secondary | ICD-10-CM

## 2013-06-21 MED ORDER — ATORVASTATIN CALCIUM 10 MG PO TABS: 10.0000 mg | ORAL_TABLET | Freq: Every day | ORAL | Status: DC

## 2013-06-21 NOTE — Patient Instructions (Signed)
Stop amiodarone.  Start atorvastatin 10mg  daily.   Your physician recommends that you return for a FASTING lipid profile /liver profile in 2 months.  Your physician wants you to follow-up in: 6 months with Dr Shirlee Latch. (April 2015).  You will receive a reminder letter in the mail two months in advance. If you don't receive a letter, please call our office to schedule the follow-up appointment.

## 2013-06-22 NOTE — Progress Notes (Signed)
Patient ID: Michaela [name], female   DOB: March 17, 1956, 57 y.o.   MRN: [id number] PCP: Dr. 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  At baseline, patient has poor balance from ataxia and can walk limited distances with a walker but typically uses her wheelchair.  She had been living independently with help from family.  TTE in 2012 suggested significant mitral regurgitation.  I did a TEE to more closely assess the valve in 7/12.  This confirmed severe MR with bileaflet prolapse and EF 60%.  I started her on Lasix 20 mg daily (later increased to 20 mg bid), and she reports significant improvement in breathing.  She underwent minimally invasive mitral valve repair in 2/13 with Alfieri edge to edge repair and annuloplasty.  Echo post-op showed normal EF, no MR, and mild to moderate mitral stenosis.  She was discharged to [facility].  She developed dyspnea after discharge and was found to have a large left pleural effusion.  She underwent thoracentesis and Lasix was increased, with resolution of dyspnea.  She then developed severe C difficile colitis and was admitted again to [facility] in 3/13.  She was hypotensive requiring pressors.  She had probable aspiration with intubation and development of an ARDS-like picture.  She also went into atrial fibrillation with RVR.  After a prolonged course in the hospital, she went to SNF and is now back home again.  She is in NSR on amiodarone with no tachypalpitations.  BP is low today, and she reports occasional lightheadedness.  No chest pain.  She still "smothers" if she does not use 2 pillows in bed.  However, she does not note dyspnea during the day.   Labs (6/12): BNP 88 Labs (7/12): K 3.9, creatinine 0.6, BNP 86 Labs (10/12): K 3.7, creatinine 0.6 Labs (3/13): WBCs 15, HCT 40.5, plts 129, AST 55, ALT 64, BNP 6897 Labs (4/13): K  4.1, creatinine 0.45, digoxin 2.2 Labs (6/13): TSH normal, LFTs normal, K 4.1, creatinine 0.5 Labs (9/14): K 4, creatinine 0.7, TSH normal, LFTs normal  PMH:  1. Mitral valve prolapse with mitral regurgitation.  Echo (2/06): Moderate MR.  Echo (6/12): EF 60%, normal LV size, indeterminant diastolic function (suspect pseudonormal), bileaflet mitral valve prolapse with severe mitral regurgitation, moderate LAE, PA systolic pressure 28 mmHg.  TEE (7/12): EF 60%, normal LV size, severe MR with bileaflet prolapse, RV-RA gradient 30 mmHg.  Minimally invasive mitral valve repair with complex Alfieri edge-to-edge repair and annuloplasty ring.  Echo (2/13) showed EF 55-60% with septal bounce, MV repair with Alfieri stitch, no MR, possible mild to moderate mitral stenosis with normal RV and PASP 41 mmHg.  Echo (3/13) showed EF 55-60%, MV repair with no MR and mean gradient of 9 mmHg across the valve, moderate LAE.  2. Astrocytoma s/p resection and radiation in 1971.  She was left with residual spasticity and ataxia and is mainly limited to a wheelchair.  3. Hyperlipidemia 4. Arterial dopplers (2/09): normal 5. Diastolic CHF 6. Atrial fibrillation: Paroxysmal.  Now on coumadin and amiodarone.  7. C difficile colitis in 3/13 (severe with prolonged hospitalization).   SH: Uses wheelchair but can walk short distances with walker. Lives by herself in [place] but sister and mother help.  Nonsmoker.  Occasional ETOH.   FH: Father with MI at 45  ROS: All systems reviewed and negative except as per HPI.   Current  Outpatient Prescriptions  Medication Sig Dispense Refill  . aspirin EC 81 MG tablet Take 1 tablet (81 mg total) by mouth daily.  90 tablet  3  . denosumab (PROLIA) 60 MG/ML SOLN injection Inject 60 mg into the skin every 6 (six) months. Administer in upper arm, thigh, or abdomen  2 Syringe  1  . metoprolol succinate (TOPROL-XL) 25 MG 24 hr tablet TAKE 1/2 TABLET  (12.5 MG TOTAL) BY MOUTH DAILY.  15  tablet  0  . polyethylene glycol powder (GLYCOLAX/MIRALAX) powder Take 17 g by mouth daily.  3350 g  1  . Probiotic Product (PHILLIPS COLON HEALTH) CAPS Take by mouth daily.      . solifenacin (VESICARE) 10 MG tablet Take 1 tablet (10 mg total) by mouth daily.  30 tablet  11  . temazepam (RESTORIL) 7.5 MG capsule Take 1-2 capsules (7.5-15 mg total) by mouth at bedtime as needed.  60 capsule  3  . vitamin B-12 (CYANOCOBALAMIN) 1000 MCG tablet Take 1,000 mcg by mouth daily.      Marland Kitchen atorvastatin (LIPITOR) 10 MG tablet Take 1 tablet (10 mg total) by mouth daily.  30 tablet  3  . [DISCONTINUED] imipramine (TOFRANIL) 25 MG tablet Take 25 mg by mouth at bedtime.       . [DISCONTINUED] metoprolol tartrate (LOPRESSOR) 12.5 mg TABS Take 12.5 mg by mouth 2 (two) times daily.      . [DISCONTINUED] polysaccharide iron (NIFEREX) 150 MG CAPS capsule Take 1 capsule (150 mg total) by mouth daily.  30 each  0  . [DISCONTINUED] pravastatin (PRAVACHOL) 80 MG tablet Take 80 mg by mouth daily.      . [DISCONTINUED] tolterodine (DETROL LA) 4 MG 24 hr capsule Take 1 capsule (4 mg total) by mouth daily.  30 capsule  5   No current facility-administered medications for this visit.    BP 80/60  Pulse 75  Ht 4\' 11"  (1.499 m)  Wt 80 lb (36.288 kg)  BMI 16.15 kg/m2 General: thin, no distress Neck: JVP not elevated, no thyromegaly or thyroid nodule.  Lungs: Slight decreased breath sounds at left base.  CV: Nondisplaced PMI.  Heart regular S1/S2, no S3/S4, no murmur.  No peripheral edema.  No carotid bruit.  Normal pedal pulses.  Abdomen: Soft, nontender, no hepatosplenomegaly, no distention.  Neurologic: Alert and oriented x 3, gait ataxia, resting tremor noted in hands.  Psych: Normal affect. Extremities: No clubbing or cyanosis.   Assessment/Plan:  1. Mitral valve repair: Stable s/p MV repair.  Mean gradient across mitral valve was elevated on last echo.  I will repeat an echo to follow this after next appointment.    2. Chronic diastolic CHF: She is no longer on Lasix and looks euvolemic.  3. Atrial fibrillation: Paroxysmal, not documented recently.  No recent tachypalpitations.  Her BP is running low.  I will have her stop amiodarone to see if this helps with her BP.  4. Hyperlipidemia: She ought to be on a statin (LDL is very high).  Will add atorvastatin 10 mg daily to her regimen with lipids/LFTs in 2 months.   [name] 06/22/2013

## 2013-08-16 ENCOUNTER — Telehealth: Payer: Self-pay | Admitting: Cardiology

## 2013-08-16 NOTE — Telephone Encounter (Signed)
Spoke with patient about lab appt

## 2013-08-16 NOTE — Telephone Encounter (Signed)
New Message  Pt called has labs in the morning.. Requests a call back to discuss if she should take her medications in the morning.. Please assist

## 2013-08-17 ENCOUNTER — Other Ambulatory Visit (INDEPENDENT_AMBULATORY_CARE_PROVIDER_SITE_OTHER): Payer: Medicare Other

## 2013-08-17 DIAGNOSIS — I059 Rheumatic mitral valve disease, unspecified: Secondary | ICD-10-CM | POA: Diagnosis not present

## 2013-08-17 DIAGNOSIS — I340 Nonrheumatic mitral (valve) insufficiency: Secondary | ICD-10-CM

## 2013-08-17 DIAGNOSIS — I509 Heart failure, unspecified: Secondary | ICD-10-CM

## 2013-08-17 DIAGNOSIS — I4891 Unspecified atrial fibrillation: Secondary | ICD-10-CM

## 2013-08-17 DIAGNOSIS — I5032 Chronic diastolic (congestive) heart failure: Secondary | ICD-10-CM

## 2013-08-17 DIAGNOSIS — I480 Paroxysmal atrial fibrillation: Secondary | ICD-10-CM

## 2013-08-17 LAB — HEPATIC FUNCTION PANEL
ALT: 17 U/L (ref 0–35)
AST: 17 U/L (ref 0–37)
Albumin: 3.9 g/dL (ref 3.5–5.2)
Bilirubin, Direct: 0.1 mg/dL (ref 0.0–0.3)
Total Bilirubin: 0.7 mg/dL (ref 0.3–1.2)

## 2013-08-17 LAB — LIPID PANEL
HDL: 40.8 mg/dL (ref >39.00)
Triglycerides: 44 mg/dL (ref 0.0–149.0)

## 2013-09-15 ENCOUNTER — Other Ambulatory Visit: Payer: Self-pay | Admitting: Cardiology

## 2013-09-23 ENCOUNTER — Telehealth: Payer: Self-pay | Admitting: Internal Medicine

## 2013-09-23 NOTE — Telephone Encounter (Signed)
Called pt sister (kim) inform her sent mrs. [name] a msg to verify insurance coverage. Once we here back from insurance status we will call her to get pt set-up for prolia...Johny Chess

## 2013-09-23 NOTE — Telephone Encounter (Signed)
Patients sister called stating the patient was seen on 05/14/13 and was told to call back in in 6 months For a 2 yr Prolia - she stated she didn't know if that's something that is called in and they pick up or  If she needs to schedule an appointment to have that done here  Please advise would like A call back at [phone]

## 2013-09-27 ENCOUNTER — Encounter: Payer: Self-pay | Admitting: *Deleted

## 2013-09-27 ENCOUNTER — Telehealth: Payer: Self-pay | Admitting: *Deleted

## 2013-09-27 DIAGNOSIS — G709 Myoneural disorder, unspecified: Secondary | ICD-10-CM

## 2013-09-27 DIAGNOSIS — C719 Malignant neoplasm of brain, unspecified: Secondary | ICD-10-CM

## 2013-09-27 NOTE — Telephone Encounter (Signed)
Sister phoned requesting a letter of necessity for insurance company [facility]) so that they would [facility]  Please advise.   Carolyne Littles (sister) CB# [phone]

## 2013-09-27 NOTE — Telephone Encounter (Signed)
Notified sister that letter had been composed & awaiting MD signature that I would mail to patient's address for them to forward to insurance company.

## 2013-09-27 NOTE — Telephone Encounter (Signed)
Okay to generate a letter stating I feel it is medically necessary to continue to [facility] to treat her neuromuscular disorder and spasticity which is residual from treatment of her childhood brain astrocytoma.  Thank you. I will sign when complete

## 2013-09-28 ENCOUNTER — Telehealth: Payer: Self-pay | Admitting: *Deleted

## 2013-09-28 NOTE — Telephone Encounter (Signed)
Noted! Her last injection she brought medication in herself. Will probably have to do the same this times...Michaela [name]

## 2013-09-28 NOTE — Telephone Encounter (Signed)
Message copied by Earnstine Regal on Tue Sep 28, 2013 11:47 AM ------      Message from: Kathreen Cosier      Created: Tue Sep 28, 2013 11:02 AM      Regarding: RE: Leandrew Koyanagi,            Mrs. Dawkin's Medicare h/approved her for the Prolia inj, but she also has Medicaid & they will only cover the inj under Pharmacy benefits.  It is no longer covered under major medical benefits.  She will probably have to pay around $180 out of pocket if she does not pick up the Prolia from her Pharmacy.   I just spoke w/Mrs. Wertenberger so I could explain how Medicaid will pay.  She said she will have her sister call me one day this week so I can explain it to her.             The papers from last year shows she had the inj on 04/01/13.  She should not come in to get the inj before 04/02/14.              Thanks      [name]             ----- Message -----         From: Earnstine Regal, MA         Sent: 09/23/2013  11:32 AM           To: Carollee Sires McClinton      Subject: prolia                                                   Hey Mrs. Bertram Millard, Mrs. Rosch is due for her 6 month prolia. Let me know status & i will call her to set-up appt!            Thanks Lucy       ------

## 2013-09-30 ENCOUNTER — Telehealth: Payer: Self-pay | Admitting: *Deleted

## 2013-09-30 DIAGNOSIS — M810 Age-related osteoporosis without current pathological fracture: Secondary | ICD-10-CM

## 2013-09-30 MED ORDER — DENOSUMAB 60 MG/ML ~~LOC~~ SOLN: 60.0000 mg | SUBCUTANEOUS | Status: DC

## 2013-09-30 NOTE — Telephone Encounter (Signed)
Left msg on triage stating wanting to see can pt get her prolia injection next week when she see md. Her Medicaid nurse contact them & stated prolia is covered. Will need prolia sent to her pharmacy. Called sister back [name]) inform her yes that will bo ok will send script to pharmacy...Johny Chess

## 2013-10-06 ENCOUNTER — Ambulatory Visit: Payer: Medicare Other | Admitting: Internal Medicine

## 2013-11-10 ENCOUNTER — Other Ambulatory Visit (INDEPENDENT_AMBULATORY_CARE_PROVIDER_SITE_OTHER): Payer: Medicare Other

## 2013-11-10 ENCOUNTER — Ambulatory Visit (INDEPENDENT_AMBULATORY_CARE_PROVIDER_SITE_OTHER): Payer: Medicare Other | Admitting: *Deleted

## 2013-11-10 DIAGNOSIS — M948X9 Other specified disorders of cartilage, unspecified sites: Secondary | ICD-10-CM | POA: Diagnosis not present

## 2013-11-10 DIAGNOSIS — M810 Age-related osteoporosis without current pathological fracture: Secondary | ICD-10-CM | POA: Diagnosis not present

## 2013-11-10 LAB — COMPREHENSIVE METABOLIC PANEL
ALBUMIN: 3.9 g/dL (ref 3.5–5.2)
ALK PHOS: 41 U/L (ref 39–117)
ALT: 14 U/L (ref 0–35)
AST: 15 U/L (ref 0–37)
BUN: 21 mg/dL (ref 6–23)
CO2: 33 mEq/L — ABNORMAL HIGH (ref 19–32)
Calcium: 9.5 mg/dL (ref 8.4–10.5)
Chloride: 101 mEq/L (ref 96–112)
Creatinine, Ser: 0.7 mg/dL (ref 0.4–1.2)
GFR: 96.79 mL/min (ref >60.00)
Glucose, Bld: 65 mg/dL — ABNORMAL LOW (ref 70–99)
POTASSIUM: 3.7 meq/L (ref 3.5–5.1)
SODIUM: 138 meq/L (ref 135–145)
TOTAL PROTEIN: 6.4 g/dL (ref 6.0–8.3)
Total Bilirubin: 1 mg/dL (ref 0.3–1.2)

## 2013-11-10 MED ORDER — DENOSUMAB 60 MG/ML ~~LOC~~ SOLN
60.0000 mg | Freq: Once | SUBCUTANEOUS | Status: AC
  Administered 2013-11-10: 60 mg via SUBCUTANEOUS

## 2013-11-10 NOTE — Progress Notes (Signed)
   Subjective:    Patient ID: Michaela [name], female    DOB: 1956/03/26, 58 y.o.   MRN: [id number]  HPI    Review of Systems     Objective:   Physical Exam        Assessment & Plan:

## 2013-11-10 NOTE — Patient Instructions (Signed)
Consulted with Dr. Alona Bene, patient's last BMP 05/14/13 with a calcium of 9.4; Per Dr. Ronnald Ramp, have pt get CMP drawn prior to Prolia administration. Family member & pt notified and are en route downstairs to the lab.  Have other appts & will call back to reschedule Prolia injection

## 2013-11-12 ENCOUNTER — Telehealth: Payer: Self-pay | Admitting: *Deleted

## 2013-11-12 NOTE — Telephone Encounter (Signed)
Received fax pt needing PA on her vesicare. Completed on cover-my-meds. Waiting on approval status...Michaela [name]

## 2013-11-15 NOTE — Telephone Encounter (Signed)
Received PA back med was denied. It states pt must try at least 2 alternative. Place PA on md desk to review...Michaela [name]

## 2013-11-16 MED ORDER — TROSPIUM CHLORIDE ER 60 MG PO CP24: 60.0000 mg | ORAL_CAPSULE | Freq: Every day | ORAL | Status: DC

## 2013-11-16 NOTE — Telephone Encounter (Signed)
Notified pt with md response. Med has been sent to [facility]...Michaela [name]

## 2013-11-16 NOTE — Telephone Encounter (Signed)
Please let pt/family know same Pt prev on generic detrol LA (changed to [facility] 11/2012 due to ineffective symptom control with Detrol) Will change now to generic [facility] xr for bladder symptoms - erx done thanks

## 2013-12-09 ENCOUNTER — Ambulatory Visit (INDEPENDENT_AMBULATORY_CARE_PROVIDER_SITE_OTHER): Payer: Medicare Other | Admitting: Internal Medicine

## 2013-12-09 ENCOUNTER — Encounter: Payer: Self-pay | Admitting: Internal Medicine

## 2013-12-09 VITALS — BP 100/60 | HR 100 | Temp 97.9°F | Wt 75.0 lb

## 2013-12-09 DIAGNOSIS — R300 Dysuria: Secondary | ICD-10-CM

## 2013-12-09 DIAGNOSIS — Z1239 Encounter for other screening for malignant neoplasm of breast: Secondary | ICD-10-CM | POA: Diagnosis not present

## 2013-12-09 DIAGNOSIS — R279 Unspecified lack of coordination: Secondary | ICD-10-CM | POA: Diagnosis not present

## 2013-12-09 DIAGNOSIS — N319 Neuromuscular dysfunction of bladder, unspecified: Secondary | ICD-10-CM

## 2013-12-09 DIAGNOSIS — R270 Ataxia, unspecified: Secondary | ICD-10-CM

## 2013-12-09 DIAGNOSIS — R634 Abnormal weight loss: Secondary | ICD-10-CM | POA: Insufficient documentation

## 2013-12-09 NOTE — Assessment & Plan Note (Signed)
Unintentional per pt and sister (caregiver) Reports "good" intake, but VERY conscience of intake - Check screening labs encouraged follow up for age approp screening (mammo!) Refer to nutrition to review recommended intake

## 2013-12-09 NOTE — Progress Notes (Signed)
Pre visit review using our clinic review tool, if applicable. No additional management support is needed unless otherwise documented below in the visit note. 

## 2013-12-09 NOTE — Assessment & Plan Note (Signed)
On detrol prior to acute illness and hosp 01/2012 Remote urology evaluation by Dr. Reece Agar unremarkable per patient report Check urine sample now Changed Detrol to [facility] 11/2012, then changed to [facility] 11/2013 because of formulary change Improved, continue same Reconsider need for new urology referral depending on future symptoms and response to medication

## 2013-12-09 NOTE — Progress Notes (Signed)
Subjective:    Patient ID: Michaela [name], female    DOB: 1955-10-15, 57 y.o.   MRN: [id number]  HPI  Patient is here for follow up  Reviewed chronic medical issues and interval medical events  Past Medical History  Diagnosis Date  . Astrocytoma brain tumor 1971    astrocytoma treated with radiation  . Mitral regurgitation 10/2011    severe, s/p min invasive repair and annuloplasty  . Neuromuscular disorder     numbness in hands and feet  . Osteoarthritis   . Overactive bladder   . Pleural effusion, left 11/11/2011  . C. difficile colitis 0/2585    complicated by sepsis/ARDS and prolonged hosp  . Vitamin D deficiency   . Osteoporosis     DEXA @ [facility] 11/2012: -3.5, prior tx Reclast, last in 2011, rec change to Prolia  . Hyperlipidemia     Review of Systems  Constitutional: Positive for unexpected weight change (loss despite "good" intake). Negative for fever and fatigue.  Respiratory: Negative for cough and shortness of breath.   Cardiovascular: Negative for chest pain and leg swelling.       Objective:   Physical Exam  BP 100/60  Pulse 100  Temp(Src) 97.9 F (36.6 C) (Oral)  Wt 75 lb (34.02 kg)  SpO2 94% Wt Readings from Last 3 Encounters:  12/09/13 75 lb (34.02 kg)  06/21/13 80 lb (36.288 kg)  05/14/13 84 lb (38.102 kg)   Constitutional: She is petite and small statured, in WC. No distress. sister at side Neck: Normal range of motion. Neck supple. No JVD or LAD present. No thyromegaly present.  Cardiovascular: Normal rate, regular rhythm and normal heart sounds.  No murmur heard. No BLE edema. Pulmonary/Chest: Effort normal and breath sounds normal. No respiratory distress. She has no wheezes.  Musculoskeletal:  No gross deformities. Mod muscle mass atrophy of BLE. Neurological: She is alert and oriented to person, place, and time. No cranial nerve deficit. Ataxic BUE, slow and deliberate voice-pattern. Gait not tested  Psychiatric: She has a normal mood and  affect. Her behavior is normal. Judgment and thought content normal.   Lab Results  Component Value Date   WBC 6.6 05/14/2013   HGB 13.7 05/14/2013   HCT 40.8 05/14/2013   PLT 213.0 05/14/2013   GLUCOSE 65* 11/10/2013   CHOL 155 08/17/2013   TRIG 44.0 08/17/2013   HDL 40.80 08/17/2013   LDLDIRECT 165.0 05/14/2013   LDLCALC 105* 08/17/2013   ALT 14 11/10/2013   AST 15 11/10/2013   NA 138 11/10/2013   K 3.7 11/10/2013   CL 101 11/10/2013   CREATININE 0.7 11/10/2013   BUN 21 11/10/2013   CO2 33* 11/10/2013   TSH 2.59 05/14/2013   INR 1.29 12/05/2011   HGBA1C 5.5 10/11/2011    Dg Bone Density  02/22/2013   Bone density scan is consistent with osteoporosis at all sites evaluated.      Assessment & Plan:   Dysuria - check UA  Problem List Items Addressed This Visit   Ataxia     Chronic but progressive ataxia Baseline able to self transfer and take few steps with walker but debilitated by severe medical illness spring 2013 Pt/family deny "neuro dz" dx beyond remote astrocytoma resection and XRT in 1971 -  ?cervical myelopathy from prior XRT or other consider eval by neuro for same if change in symptoms, but stable at this time    Loss of weight - Primary     Unintentional  per pt and sister (caregiver) Reports "good" intake, but VERY conscience of intake - Check screening labs encouraged follow up for age approp screening (mammo!) Refer to nutrition to review recommended intake     Relevant Orders      CBC with Differential      Hepatic function panel      TSH      Amb ref to Medical Nutrition Therapy-MNT   Neurogenic bladder     On detrol prior to acute illness and hosp 01/2012 Remote urology evaluation by Dr. Reece Agar unremarkable per patient report Check urine sample now Changed Detrol to [facility] 11/2012, then changed to [facility] 11/2013 because of formulary change Improved, continue same Reconsider need for new urology referral depending on future symptoms and response to medication     Other  Visit Diagnoses   Dysuria        Relevant Orders       Urinalysis, Routine w reflex microscopic    Other screening breast examination        Relevant Orders       MM DIGITAL SCREENING BILATERAL

## 2013-12-09 NOTE — Assessment & Plan Note (Signed)
Chronic but progressive ataxia Baseline able to self transfer and take few steps with walker but debilitated by severe medical illness spring 2013 Pt/family deny "neuro dz" dx beyond remote astrocytoma resection and XRT in 1971 -  ?cervical myelopathy from prior XRT or other consider eval by neuro for same if change in symptoms, but stable at this time

## 2013-12-09 NOTE — Patient Instructions (Signed)
It was good to see you today.  We have reviewed your prior records including labs and tests today  Test(s) ordered today. Your results will be released to [place] (or called to you) after review, usually within 72hours after test completion. If any changes need to be made, you will be notified at that same time.  Medications reviewed and updated, no changes recommended at this time.  we'll make referral to nutrition and for mammogram . Our office will contact you regarding appointment(s) once made.  Please schedule followup in 3-4 months, call sooner if problems.

## 2014-01-10 ENCOUNTER — Other Ambulatory Visit (INDEPENDENT_AMBULATORY_CARE_PROVIDER_SITE_OTHER): Payer: Medicare Other

## 2014-01-10 DIAGNOSIS — R634 Abnormal weight loss: Secondary | ICD-10-CM | POA: Diagnosis not present

## 2014-01-10 DIAGNOSIS — R300 Dysuria: Secondary | ICD-10-CM

## 2014-01-10 LAB — CBC WITH DIFFERENTIAL/PLATELET
BASOS PCT: 0.3 % (ref 0.0–3.0)
Basophils Absolute: 0 10*3/uL (ref 0.0–0.1)
EOS ABS: 0.1 10*3/uL (ref 0.0–0.7)
Eosinophils Relative: 1.5 % (ref 0.0–5.0)
HCT: 40.3 % (ref 36.0–46.0)
Hemoglobin: 13.3 g/dL (ref 12.0–15.0)
LYMPHS ABS: 1.6 10*3/uL (ref 0.7–4.0)
Lymphocytes Relative: 26.5 % (ref 12.0–46.0)
MCHC: 32.9 g/dL (ref 30.0–36.0)
MCV: 90.4 fl (ref 78.0–100.0)
MONO ABS: 0.5 10*3/uL (ref 0.1–1.0)
Monocytes Relative: 8.1 % (ref 3.0–12.0)
NEUTROS PCT: 63.6 % (ref 43.0–77.0)
Neutro Abs: 3.9 10*3/uL (ref 1.4–7.7)
Platelets: 192 10*3/uL (ref 150.0–400.0)
RBC: 4.46 Mil/uL (ref 3.87–5.11)
RDW: 13.6 % (ref 11.5–14.6)
WBC: 6.1 10*3/uL (ref 4.5–10.5)

## 2014-01-10 LAB — URINALYSIS, ROUTINE W REFLEX MICROSCOPIC
Bilirubin Urine: NEGATIVE
Hgb urine dipstick: NEGATIVE
Ketones, ur: NEGATIVE
Leukocytes, UA: NEGATIVE
NITRITE: NEGATIVE
PH: 6 (ref 5.0–8.0)
RBC / HPF: NONE SEEN (ref 0–?)
SPECIFIC GRAVITY, URINE: 1.025 (ref 1.000–1.030)
TOTAL PROTEIN, URINE-UPE24: NEGATIVE
URINE GLUCOSE: NEGATIVE
Urobilinogen, UA: 0.2 (ref 0.0–1.0)

## 2014-01-10 LAB — HEPATIC FUNCTION PANEL
ALK PHOS: 47 U/L (ref 39–117)
ALT: 18 U/L (ref 0–35)
AST: 20 U/L (ref 0–37)
Albumin: 4 g/dL (ref 3.5–5.2)
BILIRUBIN TOTAL: 0.8 mg/dL (ref 0.3–1.2)
Bilirubin, Direct: 0.1 mg/dL (ref 0.0–0.3)
Total Protein: 6.7 g/dL (ref 6.0–8.3)

## 2014-01-10 LAB — TSH: TSH: 2.85 u[IU]/mL (ref 0.35–5.50)

## 2014-01-11 ENCOUNTER — Other Ambulatory Visit: Payer: Self-pay

## 2014-01-11 MED ORDER — METOPROLOL SUCCINATE ER 25 MG PO TB24: ORAL_TABLET | ORAL | Status: DC

## 2014-01-27 ENCOUNTER — Encounter: Payer: Self-pay | Admitting: Dietician

## 2014-01-27 ENCOUNTER — Ambulatory Visit (INDEPENDENT_AMBULATORY_CARE_PROVIDER_SITE_OTHER): Payer: Medicare Other | Admitting: Physician Assistant

## 2014-01-27 ENCOUNTER — Encounter: Payer: Medicare Other | Attending: Internal Medicine | Admitting: Dietician

## 2014-01-27 ENCOUNTER — Encounter: Payer: Self-pay | Admitting: Physician Assistant

## 2014-01-27 VITALS — Ht <= 58 in | Wt 77.7 lb

## 2014-01-27 VITALS — BP 97/60 | HR 81 | Ht <= 58 in | Wt 77.0 lb

## 2014-01-27 DIAGNOSIS — Z9889 Other specified postprocedural states: Secondary | ICD-10-CM | POA: Diagnosis not present

## 2014-01-27 DIAGNOSIS — I480 Paroxysmal atrial fibrillation: Secondary | ICD-10-CM

## 2014-01-27 DIAGNOSIS — R0602 Shortness of breath: Secondary | ICD-10-CM

## 2014-01-27 DIAGNOSIS — I5032 Chronic diastolic (congestive) heart failure: Secondary | ICD-10-CM | POA: Diagnosis not present

## 2014-01-27 DIAGNOSIS — Z713 Dietary counseling and surveillance: Secondary | ICD-10-CM | POA: Insufficient documentation

## 2014-01-27 DIAGNOSIS — I4891 Unspecified atrial fibrillation: Secondary | ICD-10-CM

## 2014-01-27 DIAGNOSIS — I519 Heart disease, unspecified: Secondary | ICD-10-CM | POA: Diagnosis not present

## 2014-01-27 DIAGNOSIS — R636 Underweight: Secondary | ICD-10-CM | POA: Insufficient documentation

## 2014-01-27 DIAGNOSIS — I509 Heart failure, unspecified: Secondary | ICD-10-CM | POA: Diagnosis not present

## 2014-01-27 DIAGNOSIS — I059 Rheumatic mitral valve disease, unspecified: Secondary | ICD-10-CM | POA: Diagnosis not present

## 2014-01-27 DIAGNOSIS — Z85841 Personal history of malignant neoplasm of brain: Secondary | ICD-10-CM | POA: Insufficient documentation

## 2014-01-27 DIAGNOSIS — I340 Nonrheumatic mitral (valve) insufficiency: Secondary | ICD-10-CM

## 2014-01-27 DIAGNOSIS — Z681 Body mass index (BMI) 19 or less, adult: Secondary | ICD-10-CM | POA: Diagnosis not present

## 2014-01-27 DIAGNOSIS — R634 Abnormal weight loss: Secondary | ICD-10-CM

## 2014-01-27 NOTE — Patient Instructions (Addendum)
Your physician wants you to follow-up in: 6 MONTHS WITH DR. MCLEAN You will receive a reminder letter in the mail two months in advance. If you don't receive a letter, please call our office to schedule the follow-up appointment.  Your physician recommends that you continue on your current medications as directed. Please refer to the Current Medication list given to you today.   

## 2014-01-27 NOTE — Progress Notes (Signed)
Cardiology Office Note   Date:  01/27/2014   ID:  Michaela [name], DOB [birth year], MRN [id number]  PCP:  Gwendolyn Grant, MD  Cardiologist:  Dr. Loralie Champagne      History of Present Illness: Michaela [name] is a 58 y.o. 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  TEE in 2012 demonstrated severe mitral regurgitation with bileaflet prolapse. She ultimately underwent minimally invasive mitral valve repair in 10/2011 with Alfieri edge to edge repair and annuloplasty.  She had a complicated course after being discharged to rehabilitation. This included large left pleural effusion requiring thoracentesis, C. Difficile colitis resulting in re-admission in 02/5783 complicated by aspiration requiring intubation and development of ARDS-like picture and atrial fibrillation with RVR. She converted to NSR on amiodarone. She was last seen by Dr. Aundra Dubin 06/2013. Amiodarone was discontinued at that time.  She returns for routine follow up. She is essentially wheelchair-bound. She does do some upper body exercises. She goes to the [facility] twice a week. She has noted some mild dyspnea with activity in the last several months. She denies vomiting, PND or edema. She denies chest discomfort or syncope. She does get lightheaded in the shower at times.   Studies:  - LHC (09/2011):  No CAD.  - Echo (11/2011):  EF 55-60%, MV repair ok, mod LAE.    - Carotid US (10/2011):  Normal    Recent Labs: 05/14/2013: Direct LDL 165.0  08/17/2013: HDL Cholesterol by NMR 40.80; LDL (calc) 105*  11/10/2013: Creatinine 0.7; Potassium 3.7  01/10/2014: ALT 18; Hemoglobin 13.3; TSH 2.85   Wt Readings from Last 3 Encounters:  01/27/14 77 lb (34.927 kg)  01/27/14 77 lb 11.2 oz (35.244 kg)  12/09/13 75 lb (34.02 kg)     Past Medical History: 1. Mitral valve prolapse with mitral regurgitation. Echo (2/06): Moderate MR. Echo (6/12): EF 60%, normal LV size,  indeterminant diastolic function (suspect pseudonormal), bileaflet mitral valve prolapse with severe mitral regurgitation, moderate LAE, PA systolic pressure 28 mmHg. TEE (7/12): EF 60%, normal LV size, severe MR with bileaflet prolapse, RV-RA gradient 30 mmHg. Minimally invasive mitral valve repair with complex Alfieri edge-to-edge repair and annuloplasty ring. Echo (2/13) showed EF 55-60% with septal bounce, MV repair with Alfieri stitch, no MR, possible mild to moderate mitral stenosis with normal RV and PASP 41 mmHg. Echo (3/13) showed EF 55-60%, MV repair with no MR and mean gradient of 9 mmHg across the valve, moderate LAE.  2. Astrocytoma s/p resection and radiation in 1971. She was left with residual spasticity and ataxia and is mainly limited to a wheelchair.  3. Hyperlipidemia  4. Arterial dopplers (2/09): normal  5. Diastolic CHF  6. Atrial fibrillation: Paroxysmal. Now on coumadin and amiodarone.  7. C difficile colitis in 3/13 (severe with prolonged hospitalization).    Past Medical History  Diagnosis Date  . Astrocytoma brain tumor 1971    astrocytoma treated with radiation  . Mitral regurgitation 10/2011    severe, s/p min invasive repair and annuloplasty  . Neuromuscular disorder     numbness in hands and feet  . Osteoarthritis   . Overactive bladder   . Pleural effusion, left 11/11/2011  . C. difficile colitis 02/9628    complicated by sepsis/ARDS and prolonged hosp  . Vitamin D deficiency   . Osteoporosis     DEXA @ [facility] 11/2012: -3.5, prior tx Reclast, last in 2011, rec change to Prolia  . Hyperlipidemia  Current Outpatient Prescriptions  Medication Sig Dispense Refill  . aspirin EC 81 MG tablet Take 1 tablet (81 mg total) by mouth daily.  90 tablet  3  . atorvastatin (LIPITOR) 10 MG tablet TAKE 1 TABLET (10 MG TOTAL) BY MOUTH DAILY.  30 tablet  2  . denosumab (PROLIA) 60 MG/ML SOLN injection Inject 60 mg into the skin every 6 (six) months. Administer in upper arm,  thigh, or abdomen  1 Syringe  0  . metoprolol succinate (TOPROL-XL) 25 MG 24 hr tablet TAKE 1/2 TABLET BY MOUTH DAILY  15 tablet  0  . polyethylene glycol powder (GLYCOLAX/MIRALAX) powder Take 17 g by mouth daily.  3350 g  1  . Probiotic Product ([place]) CAPS Take by mouth daily.      . Trospium Chloride 60 MG CP24 Take 1 capsule (60 mg total) by mouth daily.  90 capsule  3  . vitamin B-12 (CYANOCOBALAMIN) 1000 MCG tablet Take 1,000 mcg by mouth daily.      . [DISCONTINUED] imipramine (TOFRANIL) 25 MG tablet Take 25 mg by mouth at bedtime.       . [DISCONTINUED] metoprolol tartrate (LOPRESSOR) 12.5 mg TABS Take 12.5 mg by mouth 2 (two) times daily.      . [DISCONTINUED] polysaccharide iron (NIFEREX) 150 MG CAPS capsule Take 1 capsule (150 mg total) by mouth daily.  30 each  0  . [DISCONTINUED] pravastatin (PRAVACHOL) 80 MG tablet Take 80 mg by mouth daily.      . [DISCONTINUED] tolterodine (DETROL LA) 4 MG 24 hr capsule Take 1 capsule (4 mg total) by mouth daily.  30 capsule  5   No current facility-administered medications for this visit.    Allergies:   Compazine   Social History:  The patient  reports that she quit smoking about 23 years ago. She does not have any smokeless tobacco history on file. She reports that she drinks alcohol. She reports that she does not use illicit drugs.   Family History:  The patient's family history includes Coronary artery disease (age of onset: 69) in her father.   ROS:  Please see the history of present illness.      All other systems reviewed and negative.   PHYSICAL EXAM: VS:  BP 97/60  Pulse 81  Ht 4\' 10"  (1.473 m)  Wt 77 lb (34.927 kg)  BMI 16.10 kg/m2 Well nourished, well developed, in no acute distress HEENT: normal Neck: no JVDat 90 Cardiac:  normal S1, S2; RRR; no murmur Lungs:  clear to auscultation bilaterally, no wheezing, rhonchi or rales Abd: soft, nontender, no hepatomegaly Ext: no edema Skin: warm and dry Neuro:   CNs 2-12 intact, no focal abnormalities noted  EKG:  NSR, HR 81, normal axis, no ST changes     ASSESSMENT AND PLAN:  1. SOB (shortness of breath):  She does not appear to be volume overloaded on exam.  Weights are stable.  She had no CAD on cath in 2013.  I recommended obtaining an echocardiogram to reassess her MV repair.  After discussing this more with her sister who is here with her today, she felt like her noticeable dyspnea overall is not that bad.  She declined to schedule the echocardiogram today.  She will call back to schedule if her dyspnea becomes more noticeable.   2. Diastolic CHF:  This was in the setting of severe MR.  Current volume is stable.  3. Mitral regurgitation due to cusp prolapse S/P mitral  valve repair:  As noted, we discussed obtaining a f/u echo.  She declines for now but will call back if she changes her mind. Continue SBE prophylaxis. 4. Paroxysmal atrial fibrillation:  No recurrence.  5. Disposition:  F/u with Dr. Loralie Champagne in 6 mos.    Signed, Versie Starks, MHS 01/27/2014 12:47 PM    [facility] [place], [place], [place]  [address] Phone: [phone]; Fax: [phone]

## 2014-01-27 NOTE — Progress Notes (Signed)
Medical Nutrition Therapy:  Appt start time: 1000 end time:  1030.   Assessment:  Primary concerns today: Michaela [name] is here today since she has lost weight. Has a hx of heart disease, c.diff, and cancer. Had a brain tumor when she was 58 and had radiation on her cerebellum which affected some of the nerves which affect her bladder. Sister is at the appointment with her today. Uses a wheelchair and has a very unsteady gait.  Lowest weight was 75 lbs and is now up to a little overnight 77 lbs. Normal adult weight is about 85 lbs. Has been drinking Ensure 1 per day in the past month. Thinks that she "has to pee a lot" which is cumbersome.    Does not have a great appetite and has to remind herself to eat, sister says she will eat if someone else makes food for her. Lives by herself and "picks" during the day for food. Mother and sister bring 3-4 meals per week. Uses the microwave and does not use the stove.   Would like to get over 80 lbs again.  Wt Readings from Last 3 Encounters:  01/27/14 77 lb 11.2 oz (35.244 kg)  12/09/13 75 lb (34.02 kg)  06/21/13 80 lb (36.288 kg)   Ht Readings from Last 3 Encounters:  01/27/14 4\' 10"  (1.473 m)  06/21/13 4\' 11"  (1.499 m)  05/06/12 4\' 10"  (1.473 m)   Body mass index is 16.24 kg/(m^2). @BMIFA @ Normalized weight-for-age data available only for age 14 to 50 years. Normalized stature-for-age data available only for age 14 to 78 years.   Preferred Learning Style:   No preference indicated   Learning Readiness:   Ready  MEDICATIONS: see list   DIETARY INTAKE:  Usual eating pattern includes 3 meals and 0 snacks per day.  Avoided foods include none  24-hr recall:  B ( AM): oatmeal and half to a whole bagel with butter with 2% milk or Ensure  Snk ( AM): none  L ( PM): sandwich with peanut butter or flatbread roll ups with peanut butter and raisins and carrots with milk Snk ( PM): none D ( PM): same as lunch or meal fixed by mother such as  potatoes, chicken, and vegetables or burrito or [employer] packaged foods with milk Snk ( PM): none Beverages: 2-3 glasses of milk per day, water   Usual physical activity: goes to the [facility] and does chair yoga and uses machines 2 x week   Estimated energy needs: 1600 calories 180 g carbohydrates 120 g protein 44 g fat  Progress Towards Goal(s):  In progress.   Nutritional Diagnosis:  Sayre-3.1 Underweight and -3.2 Unintentional weight loss As related to hx of chronic disese.  As evidenced by BMI of 16.2 and recent 5-10 lb weight loss.    Intervention:  Nutrition counseling provided. Plan: Add a boiled egg to breakfast or add peanut butter to your bagel. Switch to whole milk instead of 2% milk. Try adding [employer] to milk. Have your last drink of the day by 7 PM (to help prevent having to go to the bathroom overnight). Continue eating 3 meals per day.  Continue going to the [facility] 2 x a week and start using resistance band at home a few times a week.   Teaching Method Utilized:  Visual Auditory Hands on  Barriers to learning/adherence to lifestyle change: lives by herself and limited ability to prepare meals  Demonstrated degree of understanding via:  Teach Back   Monitoring/Evaluation:  Dietary intake, exercise, and body weight prn.

## 2014-01-27 NOTE — Patient Instructions (Addendum)
Add a boiled egg to breakfast or add peanut butter to your bagel. Switch to whole milk instead of 2% milk. Try adding [employer] to milk. Have your last drink of the day by 7 PM (to help prevent having to go to the bathroom overnight). Continue eating 3 meals per day.  Continue going to the [facility] 2 x a week and start using resistance band at home a few times a week.

## 2014-02-01 ENCOUNTER — Other Ambulatory Visit: Payer: Self-pay | Admitting: Cardiology

## 2014-04-06 ENCOUNTER — Telehealth: Payer: Self-pay | Admitting: Internal Medicine

## 2014-04-06 NOTE — Telephone Encounter (Signed)
Patient's healthcare POA, Carolyne Littles, called and states that she thinks the patient may have a bladder infection. She wants someone to call her back to discuss.

## 2014-04-08 ENCOUNTER — Encounter: Payer: Self-pay | Admitting: Family

## 2014-04-08 ENCOUNTER — Ambulatory Visit (INDEPENDENT_AMBULATORY_CARE_PROVIDER_SITE_OTHER): Payer: Medicare Other | Admitting: Family

## 2014-04-08 VITALS — BP 98/62 | HR 98 | Wt 77.0 lb

## 2014-04-08 DIAGNOSIS — I10 Essential (primary) hypertension: Secondary | ICD-10-CM | POA: Diagnosis not present

## 2014-04-08 DIAGNOSIS — N3000 Acute cystitis without hematuria: Secondary | ICD-10-CM | POA: Diagnosis not present

## 2014-04-08 DIAGNOSIS — G709 Myoneural disorder, unspecified: Secondary | ICD-10-CM | POA: Diagnosis not present

## 2014-04-08 DIAGNOSIS — R300 Dysuria: Secondary | ICD-10-CM

## 2014-04-08 LAB — POCT URINALYSIS DIPSTICK
Bilirubin, UA: NEGATIVE
Glucose, UA: NEGATIVE
Nitrite, UA: POSITIVE
Spec Grav, UA: 1.02
UROBILINOGEN UA: 0.2

## 2014-04-08 MED ORDER — NITROFURANTOIN MONOHYD MACRO 100 MG PO CAPS: 100.0000 mg | ORAL_CAPSULE | Freq: Two times a day (BID) | ORAL | Status: DC

## 2014-04-08 NOTE — Progress Notes (Signed)
Pre visit review using our clinic review tool, if applicable. No additional management support is needed unless otherwise documented below in the visit note. 

## 2014-04-08 NOTE — Telephone Encounter (Signed)
Called POA Carolyne Littles left a detailed message to call back our office with symptoms the patient is having.

## 2014-04-08 NOTE — Patient Instructions (Signed)

## 2014-04-08 NOTE — Progress Notes (Signed)
Subjective:    Patient ID: Michaela [name], female    DOB: Nov 11, 1955, 58 y.o.   MRN: [id number]  Urinary Tract Infection  Associated symptoms include frequency, hematuria and urgency.   58 year old white female, nonsmoker, with a history of neuromuscular disorder, wheelchair-bound it in today with complaints of urinary frequency, urgency, burning with urination x1 week. Has a history of urinary tract infections in the past. Also has a history of C. Difficile colitis with hospitalization in the past. Has not taken any medication over-the-counter.   Review of Systems  Constitutional: Negative.   Respiratory: Negative.   Cardiovascular: Negative.   Gastrointestinal: Negative for abdominal pain and abdominal distention.  Genitourinary: Positive for dysuria, urgency, frequency and hematuria.  Musculoskeletal: Negative.   Skin: Negative.   Allergic/Immunologic: Negative.   Psychiatric/Behavioral: Negative.    Past Medical History  Diagnosis Date  . Astrocytoma brain tumor 1971    astrocytoma treated with radiation  . Mitral regurgitation 10/2011    severe, s/p min invasive repair and annuloplasty  . Neuromuscular disorder     numbness in hands and feet  . Osteoarthritis   . Overactive bladder   . Pleural effusion, left 11/11/2011  . C. difficile colitis 11/7167    complicated by sepsis/ARDS and prolonged hosp  . Vitamin D deficiency   . Osteoporosis     DEXA @ [facility] 11/2012: -3.5, prior tx Reclast, last in 2011, rec change to Prolia  . Hyperlipidemia     History   Social History  . Marital Status: Single    Spouse Name: N/A    Number of Children: N/A  . Years of Education: N/A   Occupational History  . Not on file.   Social History Main Topics  . Smoking status: Former Smoker    Quit date: 09/09/1990  . Smokeless tobacco: Not on file  . Alcohol Use: Yes  . Drug Use: No  . Sexual Activity: Not on file   Other Topics Concern  . Not on file   Social History  Narrative  . No narrative on file    Past Surgical History  Procedure Laterality Date  . Ventriculoperitoneal shunt  1971  . Knee arthroscopy      right  . Cardiac catheterization      Jan 2013  . Transesophageal echocardiogram  7/12  . US echocardiography  6/12  . Mitral valve repair  10/15/2011    Procedure: MINIMALLY INVASIVE MITRAL VALVE REPAIR (MVR);  Surgeon: Rexene Alberts, MD;  Location: [facility];  Service: Open Heart Surgery;  Laterality: Right;    Family History  Problem Relation Age of Onset  . Coronary artery disease Father 34    died MI    Allergies  Allergen Reactions  . Compazine Other (See Comments)    "eyes get buggy"    Current Outpatient Prescriptions on File Prior to Visit  Medication Sig Dispense Refill  . atorvastatin (LIPITOR) 10 MG tablet TAKE 1 TABLET (10 MG TOTAL) BY MOUTH DAILY.  30 tablet  2  . atorvastatin (LIPITOR) 10 MG tablet TAKE 1 TABLET (10 MG TOTAL) BY MOUTH DAILY.  30 tablet  5  . denosumab (PROLIA) 60 MG/ML SOLN injection Inject 60 mg into the skin every 6 (six) months. Administer in upper arm, thigh, or abdomen  1 Syringe  0  . metoprolol succinate (TOPROL-XL) 25 MG 24 hr tablet TAKE 1/2 TABLET BY MOUTH DAILY  15 tablet  0  . metoprolol succinate (TOPROL-XL) 25 MG 24  hr tablet TAKE 1/2 TABLET BY MOUTH DAILY  15 tablet  5  . polyethylene glycol powder (GLYCOLAX/MIRALAX) powder Take 17 g by mouth daily.  3350 g  1  . Probiotic Product ([place]) CAPS Take by mouth daily.      . Trospium Chloride 60 MG CP24 Take 1 capsule (60 mg total) by mouth daily.  90 capsule  3  . vitamin B-12 (CYANOCOBALAMIN) 1000 MCG tablet Take 1,000 mcg by mouth daily.      Marland Kitchen aspirin EC 81 MG tablet Take 1 tablet (81 mg total) by mouth daily.  90 tablet  3  . [DISCONTINUED] imipramine (TOFRANIL) 25 MG tablet Take 25 mg by mouth at bedtime.       . [DISCONTINUED] metoprolol tartrate (LOPRESSOR) 12.5 mg TABS Take 12.5 mg by mouth 2 (two) times daily.        . [DISCONTINUED] polysaccharide iron (NIFEREX) 150 MG CAPS capsule Take 1 capsule (150 mg total) by mouth daily.  30 each  0  . [DISCONTINUED] pravastatin (PRAVACHOL) 80 MG tablet Take 80 mg by mouth daily.      . [DISCONTINUED] tolterodine (DETROL LA) 4 MG 24 hr capsule Take 1 capsule (4 mg total) by mouth daily.  30 capsule  5   No current facility-administered medications on file prior to visit.    BP 98/62  Pulse 98  Wt 77 lb (34.927 kg)chart    Objective:   Physical Exam  Constitutional: She is oriented to person, place, and time. She appears well-developed and well-nourished.  Neck: Normal range of motion.  Cardiovascular: Normal rate, regular rhythm and normal heart sounds.   Pulmonary/Chest: Effort normal and breath sounds normal.  Abdominal: Soft. Bowel sounds are normal.  Musculoskeletal: Normal range of motion.  Neurological: She is alert and oriented to person, place, and time.  Skin: Skin is warm and dry.  Psychiatric: She has a normal mood and affect.          Assessment & Plan:  Tally was seen today for urinary tract infection.  Diagnoses and associated orders for this visit:  Acute cystitis without hematuria - Culture, Urine  Dysuria - POCT Urine Dip  Unspecified essential hypertension  Neuromuscular weakness  Other Orders - nitrofurantoin, macrocrystal-monohydrate, (MACROBID) 100 MG capsule; Take 1 capsule (100 mg total) by mouth 2 (two) times daily.   Call the office with any questions or concerns. Recheck in schedule and as needed.

## 2014-04-11 ENCOUNTER — Telehealth: Payer: Self-pay | Admitting: Internal Medicine

## 2014-04-11 NOTE — Telephone Encounter (Signed)
Relevant patient education assigned to patient using Emmi. ° °

## 2014-06-16 DIAGNOSIS — Z0279 Encounter for issue of other medical certificate: Secondary | ICD-10-CM

## 2014-07-11 ENCOUNTER — Ambulatory Visit: Payer: Medicare Other | Admitting: Physician Assistant

## 2014-07-19 ENCOUNTER — Ambulatory Visit (INDEPENDENT_AMBULATORY_CARE_PROVIDER_SITE_OTHER): Payer: Medicare Other | Admitting: Physician Assistant

## 2014-07-19 ENCOUNTER — Encounter: Payer: Self-pay | Admitting: Physician Assistant

## 2014-07-19 VITALS — BP 90/60 | HR 84 | Ht <= 58 in | Wt 79.0 lb

## 2014-07-19 DIAGNOSIS — I5032 Chronic diastolic (congestive) heart failure: Secondary | ICD-10-CM

## 2014-07-19 DIAGNOSIS — Z9889 Other specified postprocedural states: Secondary | ICD-10-CM | POA: Diagnosis not present

## 2014-07-19 DIAGNOSIS — I480 Paroxysmal atrial fibrillation: Secondary | ICD-10-CM

## 2014-07-19 DIAGNOSIS — Z23 Encounter for immunization: Secondary | ICD-10-CM

## 2014-07-19 NOTE — Patient Instructions (Signed)
Your physician recommends that you continue on your current medications as directed. Please refer to the Current Medication list given to you today.  Your physician wants you to follow-up in: 6 months with Dr. McLean. You will receive a reminder letter in the mail two months in advance. If you don't receive a letter, please call our office to schedule the follow-up appointment.  

## 2014-07-19 NOTE — Assessment & Plan Note (Signed)
No evidence of heart failure on exam 

## 2014-07-19 NOTE — Assessment & Plan Note (Signed)
Maintaining normal sinus rhythm 

## 2014-07-19 NOTE — Progress Notes (Signed)
PCP: Gwendolyn Grant, MD Cardiologist: Dr. Loralie Champagne    KGY:[id number] Michaela [name] is Michaela 58 y.o. female patient of Dr. 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 TEE in 2012 demonstrated severe mitral regurgitation with bileaflet prolapse. She ultimately underwent minimally invasive mitral valve repair in 10/2011 with Alfieri edge to edge repair and annuloplasty. She had Michaela complicated course after being discharged to rehabilitation. This included large left pleural effusion requiring thoracentesis, C. Difficile colitis resulting in re-admission in 0/2637 complicated by aspiration requiring intubation and development of ARDS-like picture and atrial fibrillation with RVR. She converted to NSR on amiodarone. She was last seen by Dr. Aundra Dubin 06/2013. Amiodarone was discontinued at that time.  She last saw Richardson Dopp, PA-C in may 2015 at which time she was short of breath but did not appear to have volume overload. Echocardiogram was recommended but she declined.  Patient comes in today feeling quite well. She says she was short of breath in May because she wasn't used to the humidity. She has had no further dyspnea, chest pain, dizziness, or presyncope. Her main complaint is related to her ataxia and her body shaking. It's worse in the one month and it makes it difficult for her to cook for herself because she is shaking so much. She hasn't seen Michaela neurologist in many years.  Studies: - LHC (09/2011): No CAD. - Echo (11/2011): EF 55-60%, MV repair ok, mod LAE.  - Carotid US (10/2011): Normal    Allergies  Allergen Reactions  . Compazine Other (See Comments)    "eyes get buggy"     Current Outpatient Prescriptions  Medication Sig Dispense Refill  . aspirin EC 81 MG tablet Take 1 tablet (81 mg total) by mouth daily. 90 tablet 3  . atorvastatin (LIPITOR) 10 MG tablet TAKE 1 TABLET (10 MG TOTAL) BY  MOUTH DAILY. 30 tablet 5  . denosumab (PROLIA) 60 MG/ML SOLN injection Inject 60 mg into the skin every 6 (six) months. Administer in upper arm, thigh, or abdomen 1 Syringe 0  . metoprolol succinate (TOPROL-XL) 25 MG 24 hr tablet TAKE 1/2 TABLET BY MOUTH DAILY 15 tablet 5  . polyethylene glycol powder (GLYCOLAX/MIRALAX) powder Take 17 g by mouth daily. 3350 g 1  . Probiotic Product ([place]) CAPS Take by mouth daily.    . Trospium Chloride 60 MG CP24 Take 1 capsule (60 mg total) by mouth daily. 90 capsule 3  . vitamin B-12 (CYANOCOBALAMIN) 1000 MCG tablet Take 1,000 mcg by mouth daily.    . [DISCONTINUED] imipramine (TOFRANIL) 25 MG tablet Take 25 mg by mouth at bedtime.     . [DISCONTINUED] metoprolol tartrate (LOPRESSOR) 12.5 mg TABS Take 12.5 mg by mouth 2 (two) times daily.    . [DISCONTINUED] polysaccharide iron (NIFEREX) 150 MG CAPS capsule Take 1 capsule (150 mg total) by mouth daily. 30 each 0  . [DISCONTINUED] pravastatin (PRAVACHOL) 80 MG tablet Take 80 mg by mouth daily.    . [DISCONTINUED] tolterodine (DETROL LA) 4 MG 24 hr capsule Take 1 capsule (4 mg total) by mouth daily. 30 capsule 5   No current facility-administered medications for this visit.    Past Medical History  Diagnosis Date  . Astrocytoma brain tumor 1971    astrocytoma treated with radiation  . Mitral regurgitation 10/2011    severe, s/p min invasive repair and annuloplasty  . Neuromuscular disorder     numbness in  hands and feet  . Osteoarthritis   . Overactive bladder   . Pleural effusion, left 11/11/2011  . C. difficile colitis 11/7167    complicated by sepsis/ARDS and prolonged hosp  . Vitamin D deficiency   . Osteoporosis     DEXA @ [facility] 11/2012: -3.5, prior tx Reclast, last in 2011, rec change to Prolia  . Hyperlipidemia     Past Surgical History  Procedure Laterality Date  . Ventriculoperitoneal shunt  1971  . Knee arthroscopy      right  . Cardiac catheterization      Jan 2013  .  Transesophageal echocardiogram  7/12  . US echocardiography  6/12  . Mitral valve repair  10/15/2011    Procedure: MINIMALLY INVASIVE MITRAL VALVE REPAIR (MVR);  Surgeon: Rexene Alberts, MD;  Location: [facility];  Service: Open Heart Surgery;  Laterality: Right;    Family History  Problem Relation Age of Onset  . Coronary artery disease Father 50    died MI    History   Social History  . Marital Status: Single    Spouse Name: N/Michaela    Number of Children: N/Michaela  . Years of Education: N/Michaela   Occupational History  . Not on file.   Social History Main Topics  . Smoking status: Former Smoker    Quit date: 09/09/1990  . Smokeless tobacco: Not on file  . Alcohol Use: Yes  . Drug Use: No  . Sexual Activity: Not on file   Other Topics Concern  . Not on file   Social History Narrative    ROS:see history of present illness otherwise negative  BP 90/60 mmHg  Pulse 84  Ht 4\' 10"  (1.473 m)  Wt 79 lb (35.834 kg)  BMI 16.52 kg/m2  PHYSICAL EXAM: Well-nournished, in no acute distress. Neck: No JVD, HJR, Bruit, or thyroid enlargement  Lungs: No tachypnea, clear without wheezing, rales, or rhonchi  Cardiovascular: RRR, PMI not displaced, Normal S1 and S2, 1/6 systolic murmur at the apex, no gallops, bruit, thrill, or heave.  Abdomen: BS normal. Soft without organomegaly, masses, lesions or tenderness.  Extremities: without cyanosis, clubbing or edema. Good distal pulses bilateral  SKin: Warm, no lesions or rashes   Musculoskeletal: No deformities  Neuro: no focal signs   Wt Readings from Last 3 Encounters:  07/19/14 79 lb (35.834 kg)  04/08/14 77 lb (34.927 kg)  01/27/14 77 lb (34.927 kg)    Lab Results  Component Value Date   WBC 6.1 01/10/2014   HGB 13.3 01/10/2014   HCT 40.3 01/10/2014   PLT 192.0 01/10/2014   GLUCOSE 65* 11/10/2013   CHOL 155 08/17/2013   TRIG 44.0 08/17/2013   HDL 40.80 08/17/2013   LDLDIRECT 165.0 05/14/2013   LDLCALC 105* 08/17/2013   ALT 18  01/10/2014   AST 20 01/10/2014   NA 138 11/10/2013   K 3.7 11/10/2013   CL 101 11/10/2013   CREATININE 0.7 11/10/2013   BUN 21 11/10/2013   CO2 33* 11/10/2013   TSH 2.85 01/10/2014   INR 1.29 12/05/2011   HGBA1C 5.5 10/11/2011    CVE:LFYBOF sinus rhythm, normal EKG   2-D echo 11/28/11  Study Conclusions  - Left ventricle: The cavity size was normal. Wall thickness   was normal. Systolic function was normal. The estimated   ejection fraction was in the range of 55% to 60%. - Mitral valve: S/P mitral valve repair with ring and   alfieri procedure (anterior leaflet sewn to posterior)  No   significant MR and only mild diastolic gradient - Left atrium: The atrium was moderately dilated. - Atrial septum: No defect or patent foramen ovale was   identified.

## 2014-07-19 NOTE — Assessment & Plan Note (Signed)
The patient is doing well status post mitral valve repair. Last 2-D echo was in 2013. She's had no change in her symptoms and she does not have any significant MR on exam. Followup with Dr. Marigene Ehlers in 6 months.

## 2014-07-26 ENCOUNTER — Other Ambulatory Visit: Payer: Self-pay | Admitting: Internal Medicine

## 2014-08-08 ENCOUNTER — Other Ambulatory Visit: Payer: Self-pay | Admitting: Cardiology

## 2014-08-30 ENCOUNTER — Telehealth: Payer: Self-pay

## 2014-08-30 NOTE — Telephone Encounter (Signed)
Faxed [employer] information back to [employer].   [employer] called and stated that they needed question number 4 completed.   Given to MD for completion.

## 2015-01-07 ENCOUNTER — Other Ambulatory Visit: Payer: Self-pay | Admitting: Cardiology

## 2015-01-09 DIAGNOSIS — Z0279 Encounter for issue of other medical certificate: Secondary | ICD-10-CM

## 2015-01-17 ENCOUNTER — Encounter: Payer: Self-pay | Admitting: Internal Medicine

## 2015-01-25 ENCOUNTER — Telehealth: Payer: Self-pay | Admitting: Internal Medicine

## 2015-01-25 NOTE — Telephone Encounter (Signed)
Patient is requesting prolia injection. I will be happy to schedule once we have verified covg. Thanks so much Rose.

## 2015-01-30 ENCOUNTER — Ambulatory Visit: Payer: Medicare Other | Admitting: Cardiology

## 2015-01-31 NOTE — Telephone Encounter (Signed)
I have electronically sent pt's info for [place] verification and will notify you once I have a response. Thank you Dustin.

## 2015-02-07 ENCOUNTER — Telehealth: Payer: Self-pay

## 2015-02-07 NOTE — Telephone Encounter (Signed)
Michaela [name] from [employer] called requesting the [facility] for pt to be sent back.  I do not have this form.   Michaela [name] can be reached at [phone]

## 2015-02-09 NOTE — Telephone Encounter (Signed)
I have pt's insurance verification for Prolia but I am trying to clarify her possible estimated responsibility w/Medicaid. I have contacted our Prolia rep and she is supposed to get back w/me later today.  I will notify you as soon as she lets me know something. Thank you.

## 2015-02-09 NOTE — Telephone Encounter (Signed)
Our Prolia rep has gotten back in w/me and has advised that Medicaid will pick up after Medicare has paid and that patient should have an estimated responsibility of $0.  Please advise her this is an estimate and we won't know an exact amt until both insurances have paid. I have sent a copy of the summary of benefits to be scanned into chart.  If you have any questions, please let me know. Thank you.

## 2015-02-10 NOTE — Telephone Encounter (Signed)
Called Michaela [name] left vm to call and schedule

## 2015-02-11 ENCOUNTER — Other Ambulatory Visit: Payer: Self-pay | Admitting: Cardiology

## 2015-02-27 ENCOUNTER — Telehealth: Payer: Self-pay | Admitting: Internal Medicine

## 2015-02-27 NOTE — Telephone Encounter (Signed)
Pt has been approved for prolia , appt setup for next Thurs at 10:30.  Please order   Thank you!!

## 2015-02-27 NOTE — Telephone Encounter (Signed)
done

## 2015-03-02 ENCOUNTER — Encounter: Payer: Self-pay | Admitting: Cardiology

## 2015-03-02 ENCOUNTER — Ambulatory Visit (INDEPENDENT_AMBULATORY_CARE_PROVIDER_SITE_OTHER): Payer: Medicare Other | Admitting: Cardiology

## 2015-03-02 ENCOUNTER — Encounter: Payer: Self-pay | Admitting: *Deleted

## 2015-03-02 VITALS — BP 90/50 | HR 88 | Ht <= 58 in | Wt 74.0 lb

## 2015-03-02 DIAGNOSIS — Z9889 Other specified postprocedural states: Secondary | ICD-10-CM | POA: Diagnosis not present

## 2015-03-02 DIAGNOSIS — I340 Nonrheumatic mitral (valve) insufficiency: Secondary | ICD-10-CM | POA: Diagnosis not present

## 2015-03-02 DIAGNOSIS — E785 Hyperlipidemia, unspecified: Secondary | ICD-10-CM | POA: Diagnosis not present

## 2015-03-02 DIAGNOSIS — I480 Paroxysmal atrial fibrillation: Secondary | ICD-10-CM

## 2015-03-02 LAB — CBC WITH DIFFERENTIAL/PLATELET
Basophils Absolute: 0 10*3/uL (ref 0.0–0.1)
Basophils Relative: 0.5 % (ref 0.0–3.0)
Eosinophils Absolute: 0.1 10*3/uL (ref 0.0–0.7)
Eosinophils Relative: 1.1 % (ref 0.0–5.0)
HCT: 38.1 % (ref 36.0–46.0)
Hemoglobin: 12.5 g/dL (ref 12.0–15.0)
Lymphocytes Relative: 20.5 % (ref 12.0–46.0)
Lymphs Abs: 1.2 10*3/uL (ref 0.7–4.0)
MCHC: 32.8 g/dL (ref 30.0–36.0)
MCV: 89.6 fl (ref 78.0–100.0)
Monocytes Absolute: 0.6 10*3/uL (ref 0.1–1.0)
Monocytes Relative: 10.5 % (ref 3.0–12.0)
Neutro Abs: 3.8 10*3/uL (ref 1.4–7.7)
Neutrophils Relative %: 67.4 % (ref 43.0–77.0)
Platelets: 182 10*3/uL (ref 150.0–400.0)
RBC: 4.26 Mil/uL (ref 3.87–5.11)
RDW: 13.7 % (ref 11.5–15.5)
WBC: 5.7 10*3/uL (ref 4.0–10.5)

## 2015-03-02 LAB — LIPID PANEL
CHOL/HDL RATIO: 4
CHOLESTEROL: 155 mg/dL (ref 0–200)
HDL: 39.9 mg/dL (ref >39.00)
LDL Cholesterol: 106 mg/dL — ABNORMAL HIGH (ref 0–99)
NonHDL: 115.1
TRIGLYCERIDES: 48 mg/dL (ref 0.0–149.0)
VLDL: 9.6 mg/dL (ref 0.0–40.0)

## 2015-03-02 LAB — BASIC METABOLIC PANEL
BUN: 26 mg/dL — ABNORMAL HIGH (ref 6–23)
CALCIUM: 9.4 mg/dL (ref 8.4–10.5)
CO2: 31 meq/L (ref 19–32)
Chloride: 105 mEq/L (ref 96–112)
Creatinine, Ser: 0.68 mg/dL (ref 0.40–1.20)
GFR: 94.71 mL/min (ref >60.00)
Glucose, Bld: 84 mg/dL (ref 70–99)
POTASSIUM: 4 meq/L (ref 3.5–5.1)
SODIUM: 141 meq/L (ref 135–145)

## 2015-03-02 LAB — TSH: TSH: 1.15 u[IU]/mL (ref 0.35–4.50)

## 2015-03-02 NOTE — Patient Instructions (Signed)
Medication Instructions:  No changes today  Labwork: BMET/Lipid profile/CBCd/TSH today  Testing/Procedures: Your physician has requested that you have an echocardiogram. Echocardiography is a painless test that uses sound waves to create images of your heart. It provides your doctor with information about the size and shape of your heart and how well your heart's chambers and valves are working. This procedure takes approximately one hour. There are no restrictions for this procedure.    Follow-Up: Your physician wants you to follow-up in: 1 year with Dr Aundra Dubin. (May 2017). You will receive a reminder letter in the mail two months in advance. If you don't receive a letter, please call our office to schedule the follow-up appointment.   Thank you for choosing [facility]!!

## 2015-03-03 NOTE — Progress Notes (Signed)
Patient ID: Michaela [name], female   DOB: March 24, 1956, 59 y.o.   MRN: [id number] PCP: Dr. 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  At baseline, patient has poor balance from ataxia and can walk limited distances with a walker but typically uses her wheelchair.  She had been living independently with help from family.  TTE in 2012 suggested significant mitral regurgitation.  I did a TEE to more closely assess the valve in 7/12.  This confirmed severe MR with bileaflet prolapse and EF 60%.  I started her on Lasix 20 mg daily (later increased to 20 mg bid), and she reports significant improvement in breathing.  She underwent minimally invasive mitral valve repair in 2/13 with Alfieri edge to edge repair and annuloplasty.  Echo post-op showed normal EF, no MR, and mild to moderate mitral stenosis.  She was discharged to [facility].  She developed dyspnea after discharge and was found to have a large left pleural effusion.  She underwent thoracentesis and Lasix was increased, with resolution of dyspnea.  She then developed severe C difficile colitis and was admitted again to [facility] in 3/13.  She was hypotensive requiring pressors.  She had probable aspiration with intubation and development of an ARDS-like picture.  She also went into atrial fibrillation with RVR.  After a prolonged course in the hospital, she went to SNF and is now back home again.  She is in NSR today.  BP soft but this is typical for her. No lightheadedness.  She lives alone.  No chest pain.  She still "smothers" if she does not use 2 pillows in bed.  However, she does not note dyspnea during the day.  She goes to the [facility] twice a week to exercise.  No palpitations.   Labs (6/12): BNP 88 Labs (7/12): K 3.9, creatinine 0.6, BNP 86 Labs (10/12): K 3.7, creatinine 0.6 Labs (3/13): WBCs 15, HCT 40.5, plts  129, AST 55, ALT 64, BNP 6897 Labs (4/13): K 4.1, creatinine 0.45, digoxin 2.2 Labs (6/13): TSH normal, LFTs normal, K 4.1, creatinine 0.5 Labs (9/14): K 4, creatinine 0.7, TSH normal, LFTs normal Labs (3/15): K 3.7, creatinine 0.7 Labs (5/15): HCT 40.3  ECG: NSR, normal  PMH:  1. Mitral valve prolapse with mitral regurgitation.  Echo (2/06): Moderate MR.  Echo (6/12): EF 60%, normal LV size, indeterminant diastolic function (suspect pseudonormal), bileaflet mitral valve prolapse with severe mitral regurgitation, moderate LAE, PA systolic pressure 28 mmHg.  TEE (7/12): EF 60%, normal LV size, severe MR with bileaflet prolapse, RV-RA gradient 30 mmHg.  Minimally invasive mitral valve repair with complex Alfieri edge-to-edge repair and annuloplasty ring.  Echo (2/13) showed EF 55-60% with septal bounce, MV repair with Alfieri stitch, no MR, possible mild to moderate mitral stenosis with normal RV and PASP 41 mmHg.  Echo (3/13) showed EF 55-60%, MV repair with no MR and mean gradient of 9 mmHg across the valve, moderate LAE.  2. Astrocytoma s/p resection and radiation in 1971.  She was left with residual spasticity and ataxia and is mainly limited to a wheelchair.  3. Hyperlipidemia 4. Arterial dopplers (2/09): normal 5. Diastolic CHF 6. Atrial fibrillation: Paroxysmal.  Now on coumadin and amiodarone.  7. C difficile colitis in 3/13 (severe with prolonged hospitalization).   SH: Uses wheelchair but can walk short distances with walker. Lives by herself in [place] but sister and  mother help.  Nonsmoker.  Occasional ETOH.   FH: Father with MI at 39  ROS: All systems reviewed and negative except as per HPI.   Current Outpatient Prescriptions  Medication Sig Dispense Refill  . aspirin EC 81 MG tablet Take 1 tablet (81 mg total) by mouth daily. 90 tablet 3  . atorvastatin (LIPITOR) 10 MG tablet TAKE 1 TABLET (10 MG TOTAL) BY MOUTH DAILY. 30 tablet 2  . denosumab (PROLIA) 60 MG/ML SOLN  injection Inject 60 mg into the skin every 6 (six) months. Administer in upper arm, thigh, or abdomen 1 Syringe 0  . metoprolol succinate (TOPROL-XL) 25 MG 24 hr tablet TAKE 1/2 TABLET BY MOUTH DAILY 15 tablet 4  . polyethylene glycol powder (GLYCOLAX/MIRALAX) powder Take 17 g by mouth daily. 3350 g 1  . Probiotic Product (Petrey) CAPS Take by mouth daily.    . Trospium Chloride 60 MG CP24 TAKE 1 CAPSULE (60 MG TOTAL) BY MOUTH DAILY. 90 capsule 2  . vitamin B-12 (CYANOCOBALAMIN) 1000 MCG tablet Take 1,000 mcg by mouth daily.    . [DISCONTINUED] imipramine (TOFRANIL) 25 MG tablet Take 25 mg by mouth at bedtime.     . [DISCONTINUED] metoprolol tartrate (LOPRESSOR) 12.5 mg TABS Take 12.5 mg by mouth 2 (two) times daily.    . [DISCONTINUED] polysaccharide iron (NIFEREX) 150 MG CAPS capsule Take 1 capsule (150 mg total) by mouth daily. 30 each 0  . [DISCONTINUED] pravastatin (PRAVACHOL) 80 MG tablet Take 80 mg by mouth daily.     No current facility-administered medications for this visit.    BP 90/50 mmHg  Pulse 88  Ht 4\' 10"  (1.473 m)  Wt 74 lb (33.566 kg)  BMI 15.47 kg/m2 General: thin, no distress Neck: JVP not elevated, no thyromegaly or thyroid nodule.  Lungs: Slight decreased breath sounds at left base.  CV: Nondisplaced PMI.  Heart regular S1/S2, no S3/S4, no murmur.  No peripheral edema.  No carotid bruit.  Normal pedal pulses.  Abdomen: Soft, nontender, no hepatosplenomegaly, no distention.  Neurologic: Alert and oriented x 3, gait ataxia, resting tremor noted in hands.  Psych: Normal affect. Extremities: No clubbing or cyanosis.   Assessment/Plan:  1. Mitral valve repair: Stable s/p MV repair.  Mean gradient across mitral valve was elevated on last echo.  I will repeat an echo to reassess the mitral valve.  2. Chronic diastolic CHF: She is no longer on Lasix and looks euvolemic.  3. Atrial fibrillation: Paroxysmal, not documented recently.  No recent  tachypalpitations.  Her BP is running low.  She is off amiodarone and is not on coumadin.  4. Hyperlipidemia: Check lipids on atorvastatin.   Loralie Champagne 03/03/2015

## 2015-03-08 ENCOUNTER — Ambulatory Visit (HOSPITAL_COMMUNITY): Payer: Medicare Other | Attending: Cardiovascular Disease

## 2015-03-08 ENCOUNTER — Telehealth: Payer: Self-pay | Admitting: Internal Medicine

## 2015-03-08 ENCOUNTER — Other Ambulatory Visit: Payer: Self-pay

## 2015-03-08 DIAGNOSIS — I509 Heart failure, unspecified: Secondary | ICD-10-CM | POA: Diagnosis not present

## 2015-03-08 DIAGNOSIS — I4891 Unspecified atrial fibrillation: Secondary | ICD-10-CM | POA: Diagnosis not present

## 2015-03-08 DIAGNOSIS — Z9889 Other specified postprocedural states: Secondary | ICD-10-CM

## 2015-03-08 DIAGNOSIS — E785 Hyperlipidemia, unspecified: Secondary | ICD-10-CM | POA: Diagnosis not present

## 2015-03-08 DIAGNOSIS — I480 Paroxysmal atrial fibrillation: Secondary | ICD-10-CM | POA: Diagnosis not present

## 2015-03-08 NOTE — Telephone Encounter (Signed)
Patient is coming in for a prolia injection tomorrow. She is calling in to be sure we have the medicine for it. Please advise

## 2015-03-08 NOTE — Telephone Encounter (Signed)
Left msg on  vm

## 2015-03-09 ENCOUNTER — Ambulatory Visit (INDEPENDENT_AMBULATORY_CARE_PROVIDER_SITE_OTHER): Payer: Medicare Other

## 2015-03-09 DIAGNOSIS — M810 Age-related osteoporosis without current pathological fracture: Secondary | ICD-10-CM

## 2015-03-09 MED ORDER — DENOSUMAB 60 MG/ML ~~LOC~~ SOLN
60.0000 mg | Freq: Once | SUBCUTANEOUS | Status: AC
  Administered 2015-03-09: 60 mg via SUBCUTANEOUS

## 2015-04-23 ENCOUNTER — Other Ambulatory Visit: Payer: Self-pay | Admitting: Internal Medicine

## 2015-04-28 ENCOUNTER — Telehealth: Payer: Self-pay | Admitting: Emergency Medicine

## 2015-04-28 NOTE — Telephone Encounter (Signed)
LVM for pt to call back about last Mammogram date.

## 2015-05-19 ENCOUNTER — Other Ambulatory Visit: Payer: Self-pay | Admitting: Cardiology

## 2015-05-29 ENCOUNTER — Other Ambulatory Visit: Payer: Self-pay | Admitting: Cardiology

## 2015-06-29 ENCOUNTER — Ambulatory Visit (INDEPENDENT_AMBULATORY_CARE_PROVIDER_SITE_OTHER): Payer: Medicare Other | Admitting: Internal Medicine

## 2015-06-29 ENCOUNTER — Encounter: Payer: Self-pay | Admitting: Internal Medicine

## 2015-06-29 ENCOUNTER — Other Ambulatory Visit (INDEPENDENT_AMBULATORY_CARE_PROVIDER_SITE_OTHER): Payer: Medicare Other

## 2015-06-29 VITALS — BP 90/64 | HR 90 | Temp 98.4°F | Resp 16 | Wt 75.0 lb

## 2015-06-29 DIAGNOSIS — R350 Frequency of micturition: Secondary | ICD-10-CM

## 2015-06-29 DIAGNOSIS — IMO0001 Reserved for inherently not codable concepts without codable children: Secondary | ICD-10-CM

## 2015-06-29 DIAGNOSIS — K2100 Gastro-esophageal reflux disease with esophagitis, without bleeding: Secondary | ICD-10-CM

## 2015-06-29 DIAGNOSIS — J310 Chronic rhinitis: Secondary | ICD-10-CM

## 2015-06-29 DIAGNOSIS — Z23 Encounter for immunization: Secondary | ICD-10-CM

## 2015-06-29 DIAGNOSIS — I9589 Other hypotension: Secondary | ICD-10-CM | POA: Diagnosis not present

## 2015-06-29 DIAGNOSIS — R351 Nocturia: Secondary | ICD-10-CM

## 2015-06-29 LAB — URINALYSIS
BILIRUBIN URINE: NEGATIVE
Hgb urine dipstick: NEGATIVE
KETONES UR: NEGATIVE
Leukocytes, UA: NEGATIVE
Nitrite: NEGATIVE
SPECIFIC GRAVITY, URINE: 1.025 (ref 1.000–1.030)
Total Protein, Urine: NEGATIVE
UROBILINOGEN UA: 0.2 (ref 0.0–1.0)
Urine Glucose: NEGATIVE
pH: 6 (ref 5.0–8.0)

## 2015-06-29 NOTE — Patient Instructions (Signed)
Plain Mucinex (NOT D) for thick secretions ;force NON dairy fluids .   Nasal cleansing in the shower as discussed with lather of mild shampoo.After 10 seconds wash off lather while  exhaling through nostrils. Make sure that all residual soap is removed to prevent irritation.  Flonase OR Nasacort AQ 1 spray in each nostril twice a day as needed. Use the "crossover" technique into opposite nostril spraying toward opposite ear @ 45 degree angle, not straight up into nostril.  Plain Allegra (NOT D )  160 daily , Loratidine 10 mg , OR Zyrtec 10 mg @ bedtime  as needed for itchy eyes & sneezing.  Reflux of gastric acid may be asymptomatic as this may occur mainly during sleep.The triggers for reflux  include stress; the "aspirin family" ; alcohol; peppermint; and caffeine (coffee, tea, cola, and chocolate). The aspirin family would include aspirin and the nonsteroidal agents such as ibuprofen &  Naproxen. Tylenol would not cause reflux. If having symptoms ; food & drink should be avoided for @ least 2 hours before going to bed.  Take the ranitidine 30 minutes before breakfast and evening meal if food is sticking. If this does not control symptoms;GI referral indicated.  Minimal Blood Pressure Goal= AVERAGE < 140/90;  Ideal is an AVERAGE < 135/85. Stop the one half dose of metoprolol 25 mg and return in 7-10 days for blood pressure check. It can also be checked at your local pharmacy. The [facility] usually has a blood pressure cuff as well.  The  Urology referral will be scheduled and you'll be notified of the time.Please call the Referral Co-Ordinator @ [phone] if you have not been notified of appointment time within 7-10 days.

## 2015-06-29 NOTE — Progress Notes (Signed)
   Subjective:    Patient ID: Michaela [name], female    DOB: [birth year], 57 y.o.   MRN: [id number]  HPI The patient is here to assess status of active health conditions.  PMH, FH, & Social History reviewed & updated.No change in [place] as recorded.  She has been compliant with her medicines without adverse effects. She states she only takes a half a metoprolol 25 mg daily.  She is on a heart healthy diet. She goes to the [facility] 2 times a week. She is on the elliptical for 30 minutes one day and does yoga for 60 minutes the second day. She denies any associated symptoms. She does have a history of mitral valve prolapse surgery. Since that surgery 3 years ago she's lost 10 pounds . She's not monitoring blood pressure at home.  She awakens with congestion in her throat at night. She has occasional dysphagia.  She feels she is having postnasal drainage as well. She denies other extrinsic symptoms  She has frequency and nocturia; the latter is at least 2-3 times per night. Her present medication is not of benefit.  She had extensive labs 03/02/15. BUN was 26 and LDL was 106. The other lab studies were normal.    Review of Systems  Chest pain, palpitations, tachycardia, exertional dyspnea, paroxysmal nocturnal dyspnea, claudication or edema are absent. No abdominal pain, significant dyspepsia,  melena, rectal bleeding, or persistently small caliber stools. Dysuria, pyuria, hematuria, or polyuria are denied. Change in hair, skin, nails denied. No bowel changes of constipation or diarrhea. No intolerance to heat or cold.     Objective:   Physical Exam  Pertinent or positive findings include: She is very thin; there is dramatic muscle loss of the extremities. She's in a wheelchair and appears to be slightly unsteady in reference to position. She also appears to have a blank stare with difficulty focusing. She states that she can see but cannot read small print. She has increased wax in otic  canals. There is significant erythema of the nasal mucosa. Her speech is somewhat halting, slow, & deliberate. Pedal pulses are decreased.  General appearance :adequately nourished; in no distress.  Eyes: No conjunctival inflammation or scleral icterus is present.  Oral exam:  Lips and gums are healthy appearing.There is no oropharyngeal erythema or exudate noted. Dental hygiene is good.  Heart:  Normal rate and regular rhythm. S1 and S2 normal without gallop, murmur, click, rub or other extra sounds    Lungs:Chest clear to auscultation; no wheezes, rhonchi,rales ,or rubs present.No increased work of breathing.   Abdomen: bowel sounds normal, soft and non-tender without masses, organomegaly or hernias noted.  No guarding or rebound.   Vascular : all pulses equal ; no bruits present.  Skin:Warm & dry.  Intact without suspicious lesions or rashes ; no tenting or jaundice   Lymphatic: No lymphadenopathy is noted about the head, neck, axilla.   Neuro: Strength, tone decreased.      Assessment & Plan:  #1 hypotension on low-dose beta blocker. She'll be asked to hold this for one week and have her blood pressure checked here. She has no way to check it at home.  #2 frequency and nocturia; Urology referral will be made  #3 rhinitis; see after visit summary  #4 probable GERD with intermittent dysphagia. See after visit summary.

## 2015-06-29 NOTE — Progress Notes (Signed)
Pre visit review using our clinic review tool, if applicable. No additional management support is needed unless otherwise documented below in the visit note. 

## 2015-07-13 ENCOUNTER — Telehealth: Payer: Self-pay | Admitting: Internal Medicine

## 2015-07-13 NOTE — Telephone Encounter (Signed)
Michaela [name], mother, called want to speak to the assistant concern about Michaela [name] BP report. Dr.Hopper ask her to hold 1 of the BP pill last ov because he BP was low and call back in a week to report. Please give her a call back  Phone # [phone]

## 2015-07-18 NOTE — Telephone Encounter (Signed)
LVM for pt's mother to call back.

## 2015-07-18 NOTE — Telephone Encounter (Signed)
Patient mother called to report BP reading today--- 82/64. She started taking the medicine again last Sunday.

## 2015-07-19 NOTE — Telephone Encounter (Signed)
Tried to contact and schedule and appt for pt to come in.

## 2015-07-19 NOTE — Telephone Encounter (Signed)
Please advise 

## 2015-07-19 NOTE — Telephone Encounter (Signed)
BP very low on small dose of medication; I recommend OV with BP diary. May need somelabs She is Val's patient

## 2015-07-27 ENCOUNTER — Other Ambulatory Visit: Payer: Self-pay | Admitting: Internal Medicine

## 2015-07-27 ENCOUNTER — Telehealth: Payer: Self-pay | Admitting: Cardiology

## 2015-07-27 NOTE — Telephone Encounter (Signed)
error 

## 2015-10-04 ENCOUNTER — Telehealth: Payer: Self-pay

## 2015-10-04 NOTE — Telephone Encounter (Signed)
Michaela [name] [phone]) left message requesting medication list for patient.   Verified that patient agrees for [place] to give information to [facility]. Media is scanned with patient approval.   Printed med list and faxed to [facility] at fax number [phone]

## 2015-11-06 ENCOUNTER — Other Ambulatory Visit (INDEPENDENT_AMBULATORY_CARE_PROVIDER_SITE_OTHER): Payer: Medicare Other

## 2015-11-06 ENCOUNTER — Encounter: Payer: Self-pay | Admitting: Internal Medicine

## 2015-11-06 ENCOUNTER — Ambulatory Visit (INDEPENDENT_AMBULATORY_CARE_PROVIDER_SITE_OTHER): Payer: Medicare Other | Admitting: Internal Medicine

## 2015-11-06 VITALS — BP 96/58 | HR 84 | Temp 98.3°F | Resp 16 | Ht <= 58 in | Wt 83.8 lb

## 2015-11-06 DIAGNOSIS — Z0000 Encounter for general adult medical examination without abnormal findings: Secondary | ICD-10-CM

## 2015-11-06 DIAGNOSIS — E785 Hyperlipidemia, unspecified: Secondary | ICD-10-CM | POA: Diagnosis not present

## 2015-11-06 LAB — LIPID PANEL
Cholesterol: 151 mg/dL (ref 0–200)
HDL: 38.1 mg/dL — ABNORMAL LOW (ref >39.00)
LDL Cholesterol: 93 mg/dL (ref 0–99)
NONHDL: 112.51
Total CHOL/HDL Ratio: 4
Triglycerides: 99 mg/dL (ref 0.0–149.0)
VLDL: 19.8 mg/dL (ref 0.0–40.0)

## 2015-11-06 LAB — COMPREHENSIVE METABOLIC PANEL
ALK PHOS: 45 U/L (ref 39–117)
ALT: 14 U/L (ref 0–35)
AST: 16 U/L (ref 0–37)
Albumin: 4.1 g/dL (ref 3.5–5.2)
BILIRUBIN TOTAL: 0.4 mg/dL (ref 0.2–1.2)
BUN: 21 mg/dL (ref 6–23)
CO2: 32 mEq/L (ref 19–32)
Calcium: 9.2 mg/dL (ref 8.4–10.5)
Chloride: 103 mEq/L (ref 96–112)
Creatinine, Ser: 0.61 mg/dL (ref 0.40–1.20)
GFR: 107.1 mL/min (ref >60.00)
GLUCOSE: 82 mg/dL (ref 70–99)
POTASSIUM: 4.1 meq/L (ref 3.5–5.1)
SODIUM: 139 meq/L (ref 135–145)
TOTAL PROTEIN: 6.3 g/dL (ref 6.0–8.3)

## 2015-11-06 LAB — CBC
HCT: 35.6 % — ABNORMAL LOW (ref 36.0–46.0)
HEMOGLOBIN: 11.9 g/dL — AB (ref 12.0–15.0)
MCHC: 33.5 g/dL (ref 30.0–36.0)
MCV: 89.4 fl (ref 78.0–100.0)
PLATELETS: 200 10*3/uL (ref 150.0–400.0)
RBC: 3.99 Mil/uL (ref 3.87–5.11)
RDW: 13.5 % (ref 11.5–15.5)
WBC: 6 10*3/uL (ref 4.0–10.5)

## 2015-11-06 MED ORDER — ATORVASTATIN CALCIUM 10 MG PO TABS: ORAL_TABLET | ORAL | Status: DC

## 2015-11-06 MED ORDER — METOPROLOL SUCCINATE ER 25 MG PO TB24: 12.5000 mg | ORAL_TABLET | Freq: Every day | ORAL | Status: DC

## 2015-11-06 MED ORDER — TROSPIUM CHLORIDE ER 60 MG PO CP24: ORAL_CAPSULE | ORAL | Status: DC

## 2015-11-06 NOTE — Progress Notes (Signed)
   Subjective:    Patient ID: Michaela [name], female    DOB: 1955-12-14, 60 y.o.   MRN: [id number]  HPI Here for medicare wellness, no new complaints. Please see A/P for status and treatment of chronic medical problems.   Diet: heart healthy Physical activity: sedentary Depression/mood screen: negative Hearing: intact to whispered voice Visual acuity: grossly normal, performs annual eye exam  ADLs: capable Fall risk: none Home safety: good Cognitive evaluation: intact to orientation, naming, recall and repetition EOL planning: adv directives discussed  I have personally reviewed and have noted 1. The patient's medical and social history - reviewed today no changes 2. Their use of alcohol, tobacco or illicit drugs 3. Their current medications and supplements 4. The patient's functional ability including ADL's, fall risks, home safety risks and hearing or visual impairment. 5. Diet and physical activities 6. Evidence for depression or mood disorders 7. Care team reviewed and updated (available in snapshot)  Review of Systems  Constitutional: Negative for fever, activity change, appetite change, fatigue and unexpected weight change.  HENT: Negative.   Eyes: Negative.   Respiratory: Negative.   Cardiovascular: Negative.   Gastrointestinal: Negative.   Musculoskeletal: Negative.   Psychiatric/Behavioral: Negative.       Objective:   Physical Exam  Constitutional: She is oriented to person, place, and time. She appears well-developed and well-nourished.  HENT:  Head: Normocephalic and atraumatic.  Eyes: EOM are normal.  Neck: Normal range of motion.  Cardiovascular: Normal rate and regular rhythm.   Pulmonary/Chest: Effort normal and breath sounds normal. No respiratory distress. She has no wheezes. She has no rales.  Abdominal: Soft. Bowel sounds are normal. She exhibits no distension. There is no tenderness. There is no rebound.  Neurological: She is alert and oriented  to person, place, and time. Coordination abnormal.  Skin: Skin is warm and dry.   Filed Vitals:   11/06/15 1609  BP: 96/58  Pulse: 84  Temp: 98.3 F (36.8 C)  TempSrc: Oral  Resp: 16  Height: 4\' 10"  (1.473 m)  Weight: 83 lb 12.8 oz (38.011 kg)  SpO2: 98%      Assessment & Plan:

## 2015-11-06 NOTE — Patient Instructions (Signed)
We will check the labs today and call you back with the results.  Health Maintenance, Female Adopting a healthy lifestyle and getting preventive care can go a long way to promote health and wellness. Talk with your health care provider about what schedule of regular examinations is right for you. This is a good chance for you to check in with your provider about disease prevention and staying healthy. In between checkups, there are plenty of things you can do on your own. Experts have done a lot of research about which lifestyle changes and preventive measures are most likely to keep you healthy. Ask your health care provider for more information. WEIGHT AND DIET  Eat a healthy diet  Be sure to include plenty of vegetables, fruits, low-fat dairy products, and lean protein.  Do not eat a lot of foods high in solid fats, added sugars, or salt.  Get regular exercise. This is one of the most important things you can do for your health.  Most adults should exercise for at least 150 minutes each week. The exercise should increase your heart rate and make you sweat (moderate-intensity exercise).  Most adults should also do strengthening exercises at least twice a week. This is in addition to the moderate-intensity exercise.  Maintain a healthy weight  Body mass index (BMI) is a measurement that can be used to identify possible weight problems. It estimates body fat based on height and weight. Your health care provider can help determine your BMI and help you achieve or maintain a healthy weight.  For females 20 years of age and older:   A BMI below 18.5 is considered underweight.  A BMI of 18.5 to 24.9 is normal.  A BMI of 25 to 29.9 is considered overweight.  A BMI of 30 and above is considered obese.  Watch levels of cholesterol and blood lipids  You should start having your blood tested for lipids and cholesterol at 58 years of age, then have this test every 5 years.  You may need to  have your cholesterol levels checked more often if:  Your lipid or cholesterol levels are high.  You are older than 58 years of age.  You are at high risk for heart disease.  CANCER SCREENING   Lung Cancer  Lung cancer screening is recommended for adults 55-80 years old who are at high risk for lung cancer because of a history of smoking.  A yearly low-dose CT scan of the lungs is recommended for people who:  Currently smoke.  Have quit within the past 15 years.  Have at least a 30-pack-year history of smoking. A pack year is smoking an average of one pack of cigarettes a day for 1 year.  Yearly screening should continue until it has been 15 years since you quit.  Yearly screening should stop if you develop a health problem that would prevent you from having lung cancer treatment.  Breast Cancer  Practice breast self-awareness. This means understanding how your breasts normally appear and feel.  It also means doing regular breast self-exams. Let your health care provider know about any changes, no matter how small.  If you are in your 20s or 30s, you should have a clinical breast exam (CBE) by a health care provider every 1-3 years as part of a regular health exam.  If you are 40 or older, have a CBE every year. Also consider having a breast X-ray (mammogram) every year.  If you have a family history of   of breast cancer, talk to your health care provider about genetic screening.  If you are at high risk for breast cancer, talk to your health care provider about having an MRI and a mammogram every year.  Breast cancer gene (BRCA) assessment is recommended for women who have family members with BRCA-related cancers. BRCA-related cancers include:  Breast.  Ovarian.  Tubal.  Peritoneal cancers.  Results of the assessment will determine the need for genetic counseling and BRCA1 and BRCA2 testing. Cervical Cancer Your health care provider may recommend that you be  screened regularly for cancer of the pelvic organs (ovaries, uterus, and vagina). This screening involves a pelvic examination, including checking for microscopic changes to the surface of your cervix (Pap test). You may be encouraged to have this screening done every 3 years, beginning at age 39.  For women ages 39-65, health care providers may recommend pelvic exams and Pap testing every 3 years, or they may recommend the Pap and pelvic exam, combined with testing for human papilloma virus (HPV), every 5 years. Some types of HPV increase your risk of cervical cancer. Testing for HPV may also be done on women of any age with unclear Pap test results.  Other health care providers may not recommend any screening for nonpregnant women who are considered low risk for pelvic cancer and who do not have symptoms. Ask your health care provider if a screening pelvic exam is right for you.  If you have had past treatment for cervical cancer or a condition that could lead to cancer, you need Pap tests and screening for cancer for at least 20 years after your treatment. If Pap tests have been discontinued, your risk factors (such as having a new sexual partner) need to be reassessed to determine if screening should resume. Some women have medical problems that increase the chance of getting cervical cancer. In these cases, your health care provider may recommend more frequent screening and Pap tests. Colorectal Cancer  This type of cancer can be detected and often prevented.  Routine colorectal cancer screening usually begins at 58 years of age and continues through 58 years of age.  Your health care provider may recommend screening at an earlier age if you have risk factors for colon cancer.  Your health care provider may also recommend using home test kits to check for hidden blood in the stool.  A small camera at the end of a tube can be used to examine your colon directly (sigmoidoscopy or colonoscopy).  This is done to check for the earliest forms of colorectal cancer.  Routine screening usually begins at age 70.  Direct examination of the colon should be repeated every 5-10 years through 58 years of age. However, you may need to be screened more often if early forms of precancerous polyps or small growths are found. Skin Cancer  Check your skin from head to toe regularly.  Tell your health care provider about any new moles or changes in moles, especially if there is a change in a mole's shape or color.  Also tell your health care provider if you have a mole that is larger than the size of a pencil eraser.  Always use sunscreen. Apply sunscreen liberally and repeatedly throughout the day.  Protect yourself by wearing long sleeves, pants, a wide-brimmed hat, and sunglasses whenever you are outside. HEART DISEASE, DIABETES, AND HIGH BLOOD PRESSURE   High blood pressure causes heart disease and increases the risk of stroke. High blood pressure is  more likely to develop in:  People who have blood pressure in the high end of the normal range (130-139/85-89 mm Hg).  People who are overweight or obese.  People who are African American.  If you are 11-85 years of age, have your blood pressure checked every 3-5 years. If you are 84 years of age or older, have your blood pressure checked every year. You should have your blood pressure measured twice--once when you are at a hospital or clinic, and once when you are not at a hospital or clinic. Record the average of the two measurements. To check your blood pressure when you are not at a hospital or clinic, you can use:  An automated blood pressure machine at a pharmacy.  A home blood pressure monitor.  If you are between 23 years and 12 years old, ask your health care provider if you should take aspirin to prevent strokes.  Have regular diabetes screenings. This involves taking a blood sample to check your fasting blood sugar level.  If you  are at a normal weight and have a low risk for diabetes, have this test once every three years after 58 years of age.  If you are overweight and have a high risk for diabetes, consider being tested at a younger age or more often. PREVENTING INFECTION  Hepatitis B  If you have a higher risk for hepatitis B, you should be screened for this virus. You are considered at high risk for hepatitis B if:  You were born in a country where hepatitis B is common. Ask your health care provider which countries are considered high risk.  Your parents were born in a high-risk country, and you have not been immunized against hepatitis B (hepatitis B vaccine).  You have HIV or AIDS.  You use needles to inject street drugs.  You live with someone who has hepatitis B.  You have had sex with someone who has hepatitis B.  You get hemodialysis treatment.  You take certain medicines for conditions, including cancer, organ transplantation, and autoimmune conditions. Hepatitis C  Blood testing is recommended for:  Everyone born from [birth year] through 1963.  Anyone with known risk factors for hepatitis C. Sexually transmitted infections (STIs)  You should be screened for sexually transmitted infections (STIs) including gonorrhea and chlamydia if:  You are sexually active and are younger than 58 years of age.  You are older than 58 years of age and your health care provider tells you that you are at risk for this type of infection.  Your sexual activity has changed since you were last screened and you are at an increased risk for chlamydia or gonorrhea. Ask your health care provider if you are at risk.  If you do not have HIV, but are at risk, it may be recommended that you take a prescription medicine daily to prevent HIV infection. This is called pre-exposure prophylaxis (PrEP). You are considered at risk if:  You are sexually active and do not regularly use condoms or know the HIV status of your  partner(s).  You take drugs by injection.  You are sexually active with a partner who has HIV. Talk with your health care provider about whether you are at high risk of being infected with HIV. If you choose to begin PrEP, you should first be tested for HIV. You should then be tested every 3 months for as long as you are taking PrEP.  PREGNANCY   If you are premenopausal and you  may become pregnant, ask your health care provider about preconception counseling.  If you may become pregnant, take 400 to 800 micrograms (mcg) of folic acid every day.  If you want to prevent pregnancy, talk to your health care provider about birth control (contraception). OSTEOPOROSIS AND MENOPAUSE   Osteoporosis is a disease in which the bones lose minerals and strength with aging. This can result in serious bone fractures. Your risk for osteoporosis can be identified using a bone density scan.  If you are 75 years of age or older, or if you are at risk for osteoporosis and fractures, ask your health care provider if you should be screened.  Ask your health care provider whether you should take a calcium or vitamin D supplement to lower your risk for osteoporosis.  Menopause may have certain physical symptoms and risks.  Hormone replacement therapy may reduce some of these symptoms and risks. Talk to your health care provider about whether hormone replacement therapy is right for you.  HOME CARE INSTRUCTIONS   Schedule regular health, dental, and eye exams.  Stay current with your immunizations.   Do not use any tobacco products including cigarettes, chewing tobacco, or electronic cigarettes.  If you are pregnant, do not drink alcohol.  If you are breastfeeding, limit how much and how often you drink alcohol.  Limit alcohol intake to no more than 1 drink per day for nonpregnant women. One drink equals 12 ounces of beer, 5 ounces of wine, or 1 ounces of hard liquor.  Do not use street drugs.  Do  not share needles.  Ask your health care provider for help if you need support or information about quitting drugs.  Tell your health care provider if you often feel depressed.  Tell your health care provider if you have ever been abused or do not feel safe at home.   This information is not intended to replace advice given to you by your health care provider. Make sure you discuss any questions you have with your health care provider.   Document Released: 03/11/2011 Document Revised: 09/16/2014 Document Reviewed: 07/28/2013 Elsevier Interactive Patient Education [employer].

## 2015-11-06 NOTE — Progress Notes (Signed)
Pre visit review using our clinic review tool, if applicable. No additional management support is needed unless otherwise documented below in the visit note. 

## 2015-11-09 DIAGNOSIS — Z0000 Encounter for general adult medical examination without abnormal findings: Secondary | ICD-10-CM | POA: Insufficient documentation

## 2015-11-09 NOTE — Assessment & Plan Note (Signed)
Mammogram and colonoscopy and pap smear all due. She defers those for today. Flu shot done this season but other immunizations not up to date. Counseled on the need for health screening especially as she ages to avoid serious health problems. Given 10 year screening recommendations.

## 2015-12-19 DIAGNOSIS — H04123 Dry eye syndrome of bilateral lacrimal glands: Secondary | ICD-10-CM | POA: Diagnosis not present

## 2015-12-22 DIAGNOSIS — R21 Rash and other nonspecific skin eruption: Secondary | ICD-10-CM | POA: Diagnosis not present

## 2015-12-22 DIAGNOSIS — W57XXXA Bitten or stung by nonvenomous insect and other nonvenomous arthropods, initial encounter: Secondary | ICD-10-CM | POA: Diagnosis not present

## 2015-12-25 ENCOUNTER — Ambulatory Visit (INDEPENDENT_AMBULATORY_CARE_PROVIDER_SITE_OTHER): Payer: Medicare Other

## 2015-12-25 DIAGNOSIS — M810 Age-related osteoporosis without current pathological fracture: Secondary | ICD-10-CM | POA: Diagnosis not present

## 2015-12-25 MED ORDER — DENOSUMAB 60 MG/ML ~~LOC~~ SOLN
60.0000 mg | Freq: Once | SUBCUTANEOUS | Status: AC
  Administered 2015-12-25: 60 mg via SUBCUTANEOUS

## 2016-01-05 ENCOUNTER — Encounter: Payer: Self-pay | Admitting: Family Medicine

## 2016-01-05 ENCOUNTER — Ambulatory Visit (INDEPENDENT_AMBULATORY_CARE_PROVIDER_SITE_OTHER): Payer: Medicare Other | Admitting: Family Medicine

## 2016-01-05 VITALS — BP 96/61 | HR 83 | Temp 98.4°F

## 2016-01-05 DIAGNOSIS — R21 Rash and other nonspecific skin eruption: Secondary | ICD-10-CM | POA: Diagnosis not present

## 2016-01-05 NOTE — Progress Notes (Signed)
   Subjective:    Patient ID: Michaela [name], female    DOB: 12/23/1955, 60 y.o.   MRN: [id number]  HPI Here for an itchy rash on her back that appeared about 10 days ago. She had a tick removed from the left armpit about 4 days before the rash appeared, and she is wooried that they may be related. Several days ago se went to Urgent Care and was given Triamcinolone cream for the rash, and it has helped. The rash is less red and less irritated now. No fevers or myalgias or headaches.    Review of Systems  Constitutional: Negative.   Respiratory: Negative.   Cardiovascular: Negative.   Musculoskeletal: Negative.   Skin: Positive for rash.  Neurological: Negative.        Objective:   Physical Exam  Constitutional: No distress.  In a wheelchair   Cardiovascular: Normal rate, regular rhythm, normal heart sounds and intact distal pulses.   Pulmonary/Chest: Effort normal and breath sounds normal.  Skin:  Widespread macular scaly pink rash over the back          Assessment & Plan:  Eczema. I reassured her this is not related to the tick bite in any way.  She may use Triamcinolone prn. [name], MD

## 2016-01-05 NOTE — Progress Notes (Signed)
Pre visit review using our clinic review tool, if applicable. No additional management support is needed unless otherwise documented below in the visit note. Pt unable to stand and weigh.   

## 2016-01-09 ENCOUNTER — Ambulatory Visit: Payer: Medicare Other | Admitting: Internal Medicine

## 2016-01-09 DIAGNOSIS — Z0289 Encounter for other administrative examinations: Secondary | ICD-10-CM

## 2016-01-11 ENCOUNTER — Telehealth: Payer: Self-pay | Admitting: Internal Medicine

## 2016-01-11 NOTE — Telephone Encounter (Signed)
Patient no sowhed for acute visit on 5/2.  Please advise.

## 2016-01-26 NOTE — Telephone Encounter (Signed)
Fine

## 2016-03-21 ENCOUNTER — Encounter: Payer: Self-pay | Admitting: Family Medicine

## 2016-03-21 ENCOUNTER — Ambulatory Visit (INDEPENDENT_AMBULATORY_CARE_PROVIDER_SITE_OTHER): Payer: Medicare Other | Admitting: Family Medicine

## 2016-03-21 VITALS — BP 80/50 | HR 89 | Temp 97.8°F | Resp 12 | Ht <= 58 in

## 2016-03-21 DIAGNOSIS — N898 Other specified noninflammatory disorders of vagina: Secondary | ICD-10-CM

## 2016-03-21 DIAGNOSIS — R300 Dysuria: Secondary | ICD-10-CM

## 2016-03-21 DIAGNOSIS — K5909 Other constipation: Secondary | ICD-10-CM | POA: Insufficient documentation

## 2016-03-21 DIAGNOSIS — K5900 Constipation, unspecified: Secondary | ICD-10-CM

## 2016-03-21 DIAGNOSIS — E559 Vitamin D deficiency, unspecified: Secondary | ICD-10-CM | POA: Diagnosis not present

## 2016-03-21 DIAGNOSIS — G709 Myoneural disorder, unspecified: Secondary | ICD-10-CM

## 2016-03-21 DIAGNOSIS — I9589 Other hypotension: Secondary | ICD-10-CM

## 2016-03-21 LAB — BASIC METABOLIC PANEL
BUN: 19 mg/dL (ref 6–23)
CHLORIDE: 104 meq/L (ref 96–112)
CO2: 33 mEq/L — ABNORMAL HIGH (ref 19–32)
Calcium: 9.4 mg/dL (ref 8.4–10.5)
Creatinine, Ser: 0.63 mg/dL (ref 0.40–1.20)
GFR: 103.05 mL/min (ref >60.00)
Glucose, Bld: 77 mg/dL (ref 70–99)
POTASSIUM: 4.2 meq/L (ref 3.5–5.1)
Sodium: 141 mEq/L (ref 135–145)

## 2016-03-21 LAB — VITAMIN D 25 HYDROXY (VIT D DEFICIENCY, FRACTURES): VITD: 43.95 ng/mL (ref 30.00–100.00)

## 2016-03-21 MED ORDER — TERCONAZOLE 0.4 % VA CREA: 1.0000 | TOPICAL_CREAM | Freq: Every day | VAGINAL | Status: AC

## 2016-03-21 NOTE — Progress Notes (Signed)
HPI:   Michaela.Michaela [name] is a 60 y.o. female, who is here today to establish care with me. She is here today with her mother and her sister.  Former PCP: Dr Michaela [name]. Last preventive routine visit: 11/2015.  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 She also underwent radiation, which according to patient, complications including neurogenic bladder and cerebellar dysfunction.   Concerns today: Constipation, sister is concerned about posture, and she is concerned about burning sensation with urination.   Constipation: This is chronic, she is on MiraLAX but states that she doesn't take it often. Tries to eat fiber in her diet but mother states that she doesn't drink enough water.    -She has residual spasticity and ataxia, most of the time she is in her wheel chair, she can walk short distance with a walker. Sister states and she has been leaning more towards her right while she is siting in her wheel chair; Michaela [name] has not noted any difference but her mother has done so for the past few months. Sister wonders if she can have PT arranged.  Hx of vit D deficiency, currently she is not on vit D supplementation.   She lives alone, sister and mother live close by. She has assistance 5 times per week, CMA; and during weekends her family helps her with her ADL's and IADL's.  -Dysuria: Burning sensation, "sometimes" for a month or so. She denies changes in urine frequency, gross hematuria, or abdominal pain. + "Creamy" vaginal discharge, no vaginal bleeding or pruritus. She thinks she may have a yeast infection.   Today her blood pressure is low, she reports history of hypertension. Currently she is on Metoprolol succinate 25 mg half tablet daily. She has history of MR, status post mitral valve repair. History of paroxistic atrial fibrillation and diastolic dysfunction, she follows with cardiologists, last seen in  02/2015, Dr  Aundra Dubin.  03/2011 Echo severe MR with bileaflet prolapse and EF 60%  She is not longer following with urologists for neurogenic bladder, she uses pull ups and pads.She is on Trospium Cl 60 mg daily.   Lab Results  Component Value Date   CHOL 151 11/06/2015   HDL 38.10* 11/06/2015   LDLCALC 93 11/06/2015   LDLDIRECT 165.0 05/14/2013   TRIG 99.0 11/06/2015   CHOLHDL 4 11/06/2015   Hx of osteoporosis, current;y she is on Prolia. [facility]erc[facility] per [facility] at the [facility], elliptical and yoga.   Fell in her bathroom 2-3 months ago, no residual symptom.   Review of Systems  Constitutional: Negative for fever, activity change, appetite change, fatigue and unexpected weight change.  HENT: Negative for mouth sores, nosebleeds, sore throat and trouble swallowing.   Eyes: Negative for redness and visual disturbance.  Respiratory: Negative for cough, shortness of breath and wheezing.   Cardiovascular: Negative for chest pain, palpitations and leg swelling.  Gastrointestinal: Negative for nausea, vomiting and abdominal pain.       Negative for changes in bowel habits.  Genitourinary: Positive for dysuria (occasional) and vaginal discharge. Negative for hematuria, decreased urine volume, vaginal bleeding and pelvic pain.  Musculoskeletal: Positive for arthralgias and gait problem. Negative for joint swelling and neck pain.  Skin: Negative for color change and rash.  Neurological: Positive for tremors and numbness. Negative for seizures, syncope, weakness and headaches.  Psychiatric/Behavioral: Negative for confusion and sleep disturbance. The patient is not nervous/anxious.  Current Outpatient Prescriptions on File Prior to Visit  Medication Sig Dispense Refill  . aspirin EC 81 MG tablet Take 1 tablet (81 mg total) by mouth daily. 90 tablet 3  . atorvastatin (LIPITOR) 10 MG tablet TAKE 1 TABLET (10 MG TOTAL) BY MOUTH DAILY. 90 tablet 3  . denosumab (PROLIA) 60 MG/ML SOLN injection  Inject 60 mg into the skin every 6 (six) months. Administer in upper arm, thigh, or abdomen 1 Syringe 0  . metoprolol succinate (TOPROL-XL) 25 MG 24 hr tablet Take 0.5 tablets (12.5 mg total) by mouth daily. 45 tablet 3  . polyethylene glycol powder (GLYCOLAX/MIRALAX) powder Take 17 g by mouth daily. 3350 g 1  . Probiotic Product ([place]) CAPS Take by mouth daily.    . Trospium Chloride 60 MG CP24 TAKE 1 CAPSULE (60 MG TOTAL) BY MOUTH DAILY. 90 capsule 2  . Trospium Chloride 60 MG CP24 TAKE 1 CAPSULE (60 MG TOTAL) BY MOUTH DAILY. 90 capsule 3  . vitamin B-12 (CYANOCOBALAMIN) 1000 MCG tablet Take 1,000 mcg by mouth daily.    . [DISCONTINUED] imipramine (TOFRANIL) 25 MG tablet Take 25 mg by mouth at bedtime.     . [DISCONTINUED] metoprolol tartrate (LOPRESSOR) 12.5 mg TABS Take 12.5 mg by mouth 2 (two) times daily.    . [DISCONTINUED] polysaccharide iron (NIFEREX) 150 MG CAPS capsule Take 1 capsule (150 mg total) by mouth daily. 30 each 0  . [DISCONTINUED] pravastatin (PRAVACHOL) 80 MG tablet Take 80 mg by mouth daily.     No current facility-administered medications on file prior to visit.     Past Medical History  Diagnosis Date  . Astrocytoma brain tumor ([place]) 1971    astrocytoma treated with radiation  . Mitral regurgitation 10/2011    severe, s/p min invasive repair and annuloplasty  . Neuromuscular disorder (HCC)     numbness in hands and feet  . Osteoarthritis   . Overactive bladder   . Pleural effusion, left 11/11/2011  . C. difficile colitis [id number]    complicated by sepsis/ARDS and prolonged hosp  . Vitamin D deficiency   . Osteoporosis     DEXA @ [facility] 11/2012: -3.5, prior tx Reclast, last in 2011, rec change to Prolia  . Hyperlipidemia    Allergies  Allergen Reactions  . Compazine Other (See Comments)    "eyes get buggy"    Family History  Problem Relation Age of Onset  . Coronary artery disease Father 50    died MI    Social History   Social History    . Marital Status: Single    Spouse Name: N/A  . Number of Children: N/A  . Years of Education: N/A   Social History Main Topics  . Smoking status: Former Smoker    Quit date: 09/09/1990  . Smokeless tobacco: None  . Alcohol Use: 0.0 oz/week    0 Standard drinks or equivalent per week  . Drug Use: No  . Sexual Activity: Not Asked   Other Topics Concern  . None   Social History Narrative    Filed Vitals:   03/21/16 1002  BP: 80/50  Pulse: 89  Temp: 97.8 F (36.6 C)  Resp: 12    There is no weight on file to calculate BMI.  SpO2 Readings from Last 3 Encounters:  03/21/16 96%  11/06/15 98%  06/29/15 97%     Physical Exam  Nursing note and vitals reviewed. Constitutional: She is oriented to person, place, and time. She  appears well-developed. No distress.  Underwt  HENT:  Head: Atraumatic.  Mouth/Throat: Oropharynx is clear and moist and mucous membranes are normal.  Eyes: Conjunctivae and EOM are normal. Pupils are equal, round, and reactive to light.  Cardiovascular: Normal rate and regular rhythm.   No murmur heard. Respiratory: Effort normal and breath sounds normal. No respiratory distress.  GI: Soft. There is no tenderness.  Musculoskeletal: She exhibits no edema.  Muscle atrophy on extremities, shoulder ROM otherwise normal. + Scoliosis. No tenderness upon palpation of paraspinal muscles, thoracic and lumbar.  Lymphadenopathy:    She has no cervical adenopathy.  Neurological: She is alert and oriented to person, place, and time. She has normal strength. She displays atrophy and tremor (with intension).  Spasticity and ataxia, wheel chair  Skin: Skin is warm. No erythema.  Psychiatric: She has a normal mood and affect.  Well groomed, good eye contact.      ASSESSMENT AND PLAN:     Michaela [name] was seen today for transfer from [facility].  Diagnoses and all orders for this visit:  Vitamin D deficiency   Vit D 1000 U daily for now. Further  recommendations will be given accordingly.  -     Basic metabolic panel -     VITAMIN D 25 Hydroxy (Vit-D Deficiency, Fractures)  Neuromuscular disorder (HCC)  With ataxia and spasticity, poor balance. PT evaluation placed.   -     Ambulatory referral to Physical Therapy  Other specified hypotension  Re-checked and slightly better but still hypotense. Will start weaning off Metoprolol. Recommend checking BP and HR at home daily. F/U in 4 weeks.  Dysuria  Intermittently + vaginal discharge, will treat empirically for vaginitis.  -     terconazole (TERAZOL 7) 0.4 % vaginal cream; Place 1 applicator vaginally at bedtime.  Vaginal discharge  General recommendations for prevention given. If not better, she may need a pelvic examination.   -     terconazole (TERAZOL 7) 0.4 % vaginal cream; Place 1 applicator vaginally at bedtime.  Chronic constipation  Chronic. Miralax q 2 days. Benefiber tid. Increase fluid intake mildly (Hx of CHF).          Elvira Langston G. [place], MD  [facility]. [place] office.

## 2016-03-21 NOTE — Progress Notes (Signed)
Pre visit review using our clinic review tool, if applicable. No additional management support is needed unless otherwise documented below in the visit note. 

## 2016-03-21 NOTE — Patient Instructions (Addendum)
A few things to remember from today's visit:   Vitamin D deficiency  Neuromuscular disorder ([place])  Other specified hypotension  Dysuria  Vaginal cream to try. Fall precautions, PT.  Decrease frequency of Metoprolol and check BP and pulse daily.   Please be sure medication list is accurate. If a new problem present, please set up appointment sooner than planned today.

## 2016-04-01 ENCOUNTER — Emergency Department (HOSPITAL_COMMUNITY): Payer: Medicare Other

## 2016-04-01 ENCOUNTER — Emergency Department (HOSPITAL_COMMUNITY)
Admission: EM | Admit: 2016-04-01 | Discharge: 2016-04-02 | Disposition: A | Discharge status: Home / Self Care | Payer: Medicare Other | Attending: Emergency Medicine | Admitting: Emergency Medicine

## 2016-04-01 ENCOUNTER — Telehealth: Payer: Self-pay | Admitting: Cardiology

## 2016-04-01 ENCOUNTER — Encounter (HOSPITAL_COMMUNITY): Payer: Self-pay | Admitting: Emergency Medicine

## 2016-04-01 DIAGNOSIS — R109 Unspecified abdominal pain: Secondary | ICD-10-CM | POA: Diagnosis not present

## 2016-04-01 DIAGNOSIS — R079 Chest pain, unspecified: Secondary | ICD-10-CM | POA: Diagnosis not present

## 2016-04-01 DIAGNOSIS — Z5321 Procedure and treatment not carried out due to patient leaving prior to being seen by health care provider: Secondary | ICD-10-CM | POA: Diagnosis not present

## 2016-04-01 DIAGNOSIS — Z87891 Personal history of nicotine dependence: Secondary | ICD-10-CM | POA: Insufficient documentation

## 2016-04-01 LAB — CBC
HEMATOCRIT: 37.7 % (ref 36.0–46.0)
HEMOGLOBIN: 12.2 g/dL (ref 12.0–15.0)
MCH: 29.3 pg (ref 26.0–34.0)
MCHC: 32.4 g/dL (ref 30.0–36.0)
MCV: 90.6 fL (ref 78.0–100.0)
Platelets: 205 10*3/uL (ref 150–400)
RBC: 4.16 MIL/uL (ref 3.87–5.11)
RDW: 13.2 % (ref 11.5–15.5)
WBC: 10.9 10*3/uL — ABNORMAL HIGH (ref 4.0–10.5)

## 2016-04-01 LAB — BASIC METABOLIC PANEL
ANION GAP: 8 (ref 5–15)
BUN: 20 mg/dL (ref 6–20)
CO2: 31 mmol/L (ref 22–32)
Calcium: 9.2 mg/dL (ref 8.9–10.3)
Chloride: 97 mmol/L — ABNORMAL LOW (ref 101–111)
Creatinine, Ser: 0.54 mg/dL (ref 0.44–1.00)
GFR calc Af Amer: >60 mL/min (ref >60)
GLUCOSE: 120 mg/dL — AB (ref 65–99)
POTASSIUM: 3.4 mmol/L — AB (ref 3.5–5.1)
Sodium: 136 mmol/L (ref 135–145)

## 2016-04-01 LAB — I-STAT TROPONIN, ED: Troponin i, poc: 0.01 ng/mL (ref 0.00–0.08)

## 2016-04-01 MED ORDER — ONDANSETRON 4 MG PO TBDP
ORAL_TABLET | ORAL | Status: AC
  Filled 2016-04-01: qty 1

## 2016-04-01 NOTE — ED Triage Notes (Signed)
ptsts chest pain and abd pain starting this am

## 2016-04-01 NOTE — Telephone Encounter (Signed)
I spoke with pt's sister, Carolyne Littles.  [name] states pt woke up this morning with a heaviness in her chest, feeling clammy. [name] states pt felt is was heartburn and took heartburn medication without relief.  [name] states pt continues to have heaviness in her chest and is clammy.  [name] advised to call 911 to transport pt to [facility] ED.

## 2016-04-18 ENCOUNTER — Ambulatory Visit (INDEPENDENT_AMBULATORY_CARE_PROVIDER_SITE_OTHER): Payer: Medicare Other | Admitting: Family Medicine

## 2016-04-18 ENCOUNTER — Encounter: Payer: Self-pay | Admitting: Family Medicine

## 2016-04-18 VITALS — BP 82/60 | HR 80 | Resp 12 | Ht <= 58 in

## 2016-04-18 DIAGNOSIS — N319 Neuromuscular dysfunction of bladder, unspecified: Secondary | ICD-10-CM | POA: Diagnosis not present

## 2016-04-18 DIAGNOSIS — Z1211 Encounter for screening for malignant neoplasm of colon: Secondary | ICD-10-CM | POA: Diagnosis not present

## 2016-04-18 DIAGNOSIS — I959 Hypotension, unspecified: Secondary | ICD-10-CM

## 2016-04-18 DIAGNOSIS — K5909 Other constipation: Secondary | ICD-10-CM

## 2016-04-18 DIAGNOSIS — K5900 Constipation, unspecified: Secondary | ICD-10-CM | POA: Diagnosis not present

## 2016-04-18 MED ORDER — LINACLOTIDE 145 MCG PO CAPS: 145.0000 ug | ORAL_CAPSULE | Freq: Every day | ORAL | 1 refills | Status: DC

## 2016-04-18 NOTE — Patient Instructions (Addendum)
A few things to remember from today's visit:   Neurogenic bladder  Chronic constipation - Plan: linaclotide (LINZESS) 145 MCG CAPS capsule, Ambulatory referral to Gastroenterology  Hypotension, unspecified hypotension type  Colon cancer screening - Plan: Ambulatory referral to Gastroenterology  Scheduled urination every 2 hours, avoid caffeine in the afternoon, avoid fluids 3-4 hours before bedtime.  Appointment with gastroenterology will be arrange.  Constipation seems to be chronic, try Benefiber daily, Linzess daily. Wearing off metoprolol, continue every 3 days for a week then every 4 days, then every 5 days, then every 6 days, then every 7 days and discontinue. If that is a concern please schedule appointment with heart doctor.  Please be sure medication list is accurate. If a new problem present, please set up appointment sooner than planned today.

## 2016-04-18 NOTE — Progress Notes (Signed)
HPI:   [name] is a 60 y.o. female, who is here today with sister and mother to follow on a few medical problems and last OV concerns.    Since I last saw her, she was last seen 03/23/2016 c/o upper abdominal pain and anterior chest pain, constant, no radiated, and at rest. After EKG,CXR, and lab work she was discharged home with diagnosis of GERD symptoms. Chest pain has resolved.    Concerns today: constipation, urinary urgency,chest pain.   I saw her last on 03/21/2016, when family and patient were concerned about constipation, which is chronic but seemed to be getting worse. She was instructed to take MiraLAX every 2 days and to increase her fiber intake, Benefiber was recommended. She still having constipation, bulky and hard stool every 3 days. Intermittent diffuse abdominal cramps, which improved after defecation. No blood in the stool or melena. Sister would like to have another colonoscopy done.   Lab Results  Component Value Date   TSH 1.15 03/02/2015    -Last office visit we also decided to start weaning off Metoprolol because low BP. She brought some BP readings, improved but still low. Currently she is taking Metoprolol Succinate 25 mg half tablet every other day.  Remote hx of atrial fibrillation, she has not noted any palpitation or tachycardia. Hx of MR, s/p mitral valve repair in 10/2011. Hx of diastolic CHF, no orthopnea or PND. She is not on longer on Lasix.   She is also concerned about urinary urgency and incontinence, she has history of neurogenic bladder, she has not followed up with urologist in years. She is currently on Trospium Chloride 60 mg daily. She wears diapers,no urine cath.  Symptoms are otherwise stable but she wonders if there is a procedure she can have done to help with urgency. No dysuria or gross hematuria. 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 She also underwent  radiation, which according to patient, caused neurogenic bladder and cerebellar dysfunction.    Review of Systems  Constitutional: Negative for activity change, appetite change, fatigue, fever and unexpected weight change.  HENT: Negative for mouth sores, nosebleeds, sore throat and trouble swallowing.   Eyes: Negative for redness and visual disturbance.  Respiratory: Negative for cough, shortness of breath and wheezing.   Cardiovascular: Negative for chest pain, palpitations and leg swelling.  Gastrointestinal: Positive for abdominal pain and constipation. Negative for abdominal distention, blood in stool, nausea and vomiting.       Negative for changes in bowel habits.  Genitourinary: Positive for urgency. Negative for decreased urine volume, dysuria, hematuria and pelvic pain.  Musculoskeletal: Positive for arthralgias and gait problem. Negative for joint swelling and neck pain.  Skin: Negative for color change and rash.  Neurological: Positive for tremors and numbness. Negative for seizures, syncope, weakness and headaches.  Psychiatric/Behavioral: Negative for confusion. The patient is nervous/anxious.       Current Outpatient Prescriptions on File Prior to Visit  Medication Sig Dispense Refill  . aspirin EC 81 MG tablet Take 1 tablet (81 mg total) by mouth daily. 90 tablet 3  . atorvastatin (LIPITOR) 10 MG tablet TAKE 1 TABLET (10 MG TOTAL) BY MOUTH DAILY. 90 tablet 3  . denosumab (PROLIA) 60 MG/ML SOLN injection Inject 60 mg into the skin every 6 (six) months. Administer in upper arm, thigh, or abdomen 1 Syringe 0  . metoprolol succinate (TOPROL-XL) 25 MG 24 hr tablet Take 0.5  tablets (12.5 mg total) by mouth daily. 45 tablet 3  . polyethylene glycol powder (GLYCOLAX/MIRALAX) powder Take 17 g by mouth daily. 3350 g 1  . Probiotic Product ([place]) CAPS Take by mouth daily.    . Trospium Chloride 60 MG CP24 TAKE 1 CAPSULE (60 MG TOTAL) BY MOUTH DAILY. 90 capsule 2  .  Trospium Chloride 60 MG CP24 TAKE 1 CAPSULE (60 MG TOTAL) BY MOUTH DAILY. 90 capsule 3  . vitamin B-12 (CYANOCOBALAMIN) 1000 MCG tablet Take 1,000 mcg by mouth daily.    . [DISCONTINUED] imipramine (TOFRANIL) 25 MG tablet Take 25 mg by mouth at bedtime.     . [DISCONTINUED] metoprolol tartrate (LOPRESSOR) 12.5 mg TABS Take 12.5 mg by mouth 2 (two) times daily.    . [DISCONTINUED] polysaccharide iron (NIFEREX) 150 MG CAPS capsule Take 1 capsule (150 mg total) by mouth daily. 30 each 0  . [DISCONTINUED] pravastatin (PRAVACHOL) 80 MG tablet Take 80 mg by mouth daily.     No current facility-administered medications on file prior to visit.      Past Medical History:  Diagnosis Date  . Astrocytoma brain tumor ([place]) 1971   astrocytoma treated with radiation  . C. difficile colitis [id number]   complicated by sepsis/ARDS and prolonged hosp  . Hyperlipidemia   . Mitral regurgitation 10/2011   severe, s/p min invasive repair and annuloplasty  . Neuromuscular disorder ([facility])    numbness in hands and feet  . Osteoarthritis   . Osteoporosis    DEXA @ [facility] 11/2012: -3.5, prior tx Reclast, last in 2011, rec change to Prolia  . Overactive bladder   . Pleural effusion, left 11/11/2011  . Vitamin D deficiency    Allergies  Allergen Reactions  . Compazine Other (See Comments)    "eyes get buggy"    Social History   Social History  . Marital status: Single    Spouse name: N/A  . Number of children: N/A  . Years of education: N/A   Social History Main Topics  . Smoking status: Former Smoker    Quit date: 09/09/1990  . Smokeless tobacco: None  . Alcohol use 0.0 oz/week  . Drug use: No  . Sexual activity: Not Asked   Other Topics Concern  . None   Social History Narrative  . None    Vitals:   04/18/16 0927  BP: (!) 82/60  Pulse: 80  Resp: 12   There is no height or weight on file to calculate BMI.  O2 sat 96% at [facility].    Physical Exam  Nursing note and vitals  reviewed. Constitutional: She is oriented to person, place, and time. She appears well-developed. No distress.  Underwt  HENT:  Head: Atraumatic.  Mouth/Throat: Oropharynx is clear and moist and mucous membranes are normal.  Eyes: Conjunctivae are normal. Pupils are equal, round, and reactive to light.  Cardiovascular: Normal rate and regular rhythm.   No murmur heard. Pulses:      Posterior tibial pulses are 2+ on the right side, and 2+ on the left side.  Respiratory: Effort normal and breath sounds normal. No respiratory distress.  GI: Soft. There is no tenderness.  Musculoskeletal: She exhibits no edema.  Lymphadenopathy:    She has no cervical adenopathy.  Neurological: She is alert and oriented to person, place, and time. She has normal strength. She displays atrophy and tremor (with intension). Coordination abnormal.  Mild dysarthria. Spasticity and ataxia, she is in a wheel chair  Skin:  Skin is warm. No erythema.  Psychiatric: Her mood appears anxious.  Well groomed, good eye contact.      ASSESSMENT AND PLAN:     Yerin was seen today for follow-up.  Diagnoses and all orders for this visit:  Neurogenic bladder  Stable. We discussed possibility of cystostomy, which I'm not sure if she is a candidate for this procedure. I offered  appointment with urologist, so she can discussed possible treatment options but  family does not think she needs to pursue other treatments at this point since problem has been stable. She decided to hold on referral.  I recommended scheduling urination (ex: every 2 hours), avoid caffeine in the afternoon, and avoiding fluid intake 3-4 hours before bedtime.  Chronic constipation  After discussion of a few treatment options, she would like to try Linzess. Some side effects discussed. Adequate fiber intake. Follow-up in 4 months.  -     linaclotide (LINZESS) 145 MCG CAPS capsule; Take 1 capsule (145 mcg total) by mouth daily before  breakfast. -     Ambulatory referral to Gastroenterology  Hypotension, unspecified hypotension type  Still low BP. She will continue weaning off metoprolol. Sister is concerned about stopping this medication, I explained that hypotension can be dangerous and even if he had a clear indication for BB, I will still recommend weaning medication off.  Recommend arranging an appointment with Dr. Benjamine Mola, which she sees annually and last seen 02/2015.   Colon cancer screening -     Ambulatory referral to Gastroenterology      -Last office visit she was referred to PT, it seems like she missed the phone call and erased the messages by accident. Today information was given so family can contact agency and arrange PT as convenient for her.     -Ms. TINA HERRINGSHAW was advised to return sooner than planned today if new concerns arise.       Betty G. Martinique, MD  [facility]. [place] office.

## 2016-05-09 ENCOUNTER — Ambulatory Visit: Payer: Medicare Other | Attending: Family Medicine

## 2016-05-09 DIAGNOSIS — R260 Ataxic gait: Secondary | ICD-10-CM | POA: Diagnosis not present

## 2016-05-09 DIAGNOSIS — M6281 Muscle weakness (generalized): Secondary | ICD-10-CM | POA: Diagnosis not present

## 2016-05-09 DIAGNOSIS — R278 Other lack of coordination: Secondary | ICD-10-CM | POA: Insufficient documentation

## 2016-05-09 DIAGNOSIS — R2689 Other abnormalities of gait and mobility: Secondary | ICD-10-CM | POA: Insufficient documentation

## 2016-05-09 NOTE — Therapy (Signed)
[facility] [address] [place], [place], [address] Phone: [phone]   Fax:  [phone]  Physical Therapy Evaluation  Patient Details  Name: Michaela [name] MRN: [id number] Date of Birth: Aug 06, 1956 Referring Provider: Dr. Martinique  Encounter Date: 05/09/2016      PT End of Session - 05/09/16 1034    Visit Number 1   Number of Visits 9   Date for PT Re-Evaluation 07/08/16   Authorization Type Medicare and Medicaid-G-CODE EVERY 10TH VISIT AND PROGRESS NOTE   PT Start Time 0931   PT Stop Time 1015   PT Time Calculation (min) 44 min   Equipment Utilized During Treatment Gait belt   Activity Tolerance Patient tolerated treatment well   Behavior During Therapy WFL for tasks assessed/performed      Past Medical History:  Diagnosis Date  . Astrocytoma brain tumor ([place]) 1971   astrocytoma treated with radiation  . C. difficile colitis [id number]   complicated by sepsis/ARDS and prolonged hosp  . Hyperlipidemia   . Mitral regurgitation 10/2011   severe, s/p min invasive repair and annuloplasty  . Neuromuscular disorder ([facility])    numbness in hands and feet  . Osteoarthritis   . Osteoporosis    DEXA @ [facility] 11/2012: -3.5, prior tx Reclast, last in 2011, rec change to Prolia  . Overactive bladder   . Pleural effusion, left 11/11/2011  . Vitamin D deficiency     Past Surgical History:  Procedure Laterality Date  . CARDIAC CATHETERIZATION     Jan 2013  . KNEE ARTHROSCOPY     right  . MITRAL VALVE REPAIR  10/15/2011   Procedure: MINIMALLY INVASIVE MITRAL VALVE REPAIR (MVR);  Surgeon: Rexene Alberts, MD;  Location: [facility];  Service: Open Heart Surgery;  Laterality: Right;  . TRANSESOPHAGEAL ECHOCARDIOGRAM  7/12  . US ECHOCARDIOGRAPHY  6/12  . VENTRICULOPERITONEAL SHUNT  1971    There were no vitals filed for this visit.       Subjective Assessment - 05/09/16 0939    Subjective Pt reports she is losing her balance  has become really bad and leans to the R side (in w/c). Pt with hx of brain tumor (astrocytoma at age 60). Pt reports ataxia has gotten progressively worse over time since surgery for astrocytoma. Pt reports she does not walk (since age 60) but stand at kitchen sink and perform txfs. Pt goes to the [facility] 2x/week (recumbent bike and chair yoga). Pt reports R shoulder pain and tightness in R UE. Pt experiences hip and back pain when in positions for prolonged periods of time.   Patient is accompained by: Family member  [name]-sister   Pertinent History A-fib (mitral valve sx in 2013), hx of astrocytoma (sx at age 60), osteoporosis, arthritis, neurogenic bladder, ataxia, CHF   Patient Stated Goals "I would like to be able to steady myself and reduce the shaking" Pt would also like to know what else she can do at the [facility].   Currently in Pain? No/denies            [facility] PT Assessment - 05/09/16 0945      Assessment   Medical Diagnosis Neuromuscular disorder   Referring Provider Dr. Martinique   Onset Date/Surgical Date 05/09/14   Hand Dominance Right   Prior Therapy Inpt rehab years ago     Precautions   Precautions Fall   Precaution Comments osteoporosis     Restrictions   Weight Bearing Restrictions No  Balance Screen   Has the patient fallen in the past 6 months Yes   How many times? 1  fell in the bathroom   Has the patient had a decrease in activity level because of a fear of falling?  No   Is the patient reluctant to leave their home because of a fear of falling?  No     Home Social worker Private residence   Living Arrangements Alone   Available Help at Discharge Family   Type of [place] - manual;Grab bars - tub/shower;Shower seat  grab bar at [employer] sink     Prior Function   Level of Independence Independent with household mobility with device  pt has CNA 5 days/week,  3 hrs/day, this is new   Public house manager On disability   Leisure Go to [employer], paint, and she has a Water engineer Status Impaired/Different from baseline   Memory Impaired   Memory Impairment Decreased short term memory  per pt report     Sensation   Light Touch Appears Intact   Additional Comments Pt reported intermittent N/T in B hands.     Coordination   Gross Motor Movements are Fluid and Coordinated No   Fine Motor Movements are Fluid and Coordinated No   Coordination and Movement Description Ataxia noted in B UE/LE.     Posture/Postural Control   Posture/Postural Control Postural limitations   Postural Limitations Rounded Shoulders;Decreased lumbar lordosis;Posterior pelvic tilt     Tone   Assessment Location Other (comment)  pt reported spasticity in BUE/LE     ROM / Strength   AROM / PROM / Strength AROM;Strength     AROM   Overall AROM  Deficits   Overall AROM Comments B shoulder IR limited, R knee ext AROM limited 2/2 R hamstring tightness, otherwise LE ROM WFL     Strength   Overall Strength Deficits   Overall Strength Comments RLE: hip flex: 3+/5, knee ext: 4/5, knee flex: 3+/5, ankle DF: 3+/5. LLE: groslly 4/5. Seated B hip abd/add: 3+/5     Transfers   Transfers Sit to Stand;Stand to Sit;Squat Pivot Transfers;Anterior-Posterior Transfer   Sit to Stand 4: Min assist;With upper extremity assist;From chair/3-in-1   Sit to Stand Details Tactile cues for initiation;Tactile cues for sequencing;Tactile cues for placement;Verbal cues for sequencing;Verbal cues for technique;Verbal cues for precautions/safety   Sit to Stand Details (indicate cue type and reason) Cues to improve ant. wt. shifting.   Stand to Sit 4: Min guard;With upper extremity assist   Squat Pivot Transfers 4: Min guard;With upper extremity assistance;With armrests  mat to w/c   Anterior-Posterior Transfer 4: Min guard;To level surface  w/c to mat     Ambulation/Gait    Ambulation/Gait No  unable to amb.     Balance   Balance Assessed Yes     Static Standing Balance   Static Standing - Balance Support Bilateral upper extremity supported   Static Standing - Level of Assistance Other (comment)  min guard   Static Standing - Comment/# of Minutes Pt performed static standing with feet apart and BUE support for 2 minutes, cues to decr. trunk flexion.                            PT Education - 05/09/16  [id number]    Education provided Yes   Education Details PT discussed frequency/duration and exam findings.   Person(s) Educated Patient;Other (comment)  sister-[name]   Methods Explanation   Comprehension Verbalized understanding          PT Short Term Goals - 05/09/16 1045      PT SHORT TERM GOAL #1   Title Pt will be IND in initial HEP to improve strength and flexibility. TARGET DATE FOR ALL STGS: 06/06/16   Status New     PT SHORT TERM GOAL #2   Title Pt will perform STS txf with B UE support and S to improve functional mobility.    Status New     PT SHORT TERM GOAL #3   Title Pt will stand at kitchen counter for 5 minutes, at MOD I level, in order to perform ADLs.    Status New           PT Long Term Goals - 05/09/16 1047      PT LONG TERM GOAL #1   Title Pt will be IND in progressed HEP to improve strength, balance, and flexilbility. TARGET DATE FOR ALL LTGS: 07/04/16   Status New     PT LONG TERM GOAL #2   Title Pt will perform static standing with 1 UE support, MOD I level, at kitchen sink to perform ADLs without rest break.    Status New     PT LONG TERM GOAL #3   Title Pt will perform STS txf at MOD I level to improve functional mobility.   Status New     PT LONG TERM GOAL #4   Title Pt will perform ant/post w/c to mat txf at MOD I level to improve functional mobility.    Status New     PT LONG TERM GOAL #5   Title Attempt amb. if appropriate and write goal.   Status New               Plan -  05/09/16 1034    Clinical Impression Statement Pt is a pleasant 60y/o female presenting to OPPT neuro with history of astrocytoma (brain tumor, resected at age 62). Pt reports balance and strength has become progressively worse over the last year. Pt's PMH significant for the following: A-fib (mitral valve sx in 2013), hx of astrocytoma (sx at age 76), osteoporosis, arthritis, neurogenic bladder, ataxia, CHF. The following deficits were noted during exam: impaired balance, decreased strength, decreased endurance, limited ROM, decreased flexibility, impaired posture, impaired coordination 2/2 ataxia, and pt unable to amb. Pt would benefit from skilled PT to improve safety during fucntional mobility.    Rehab Potential Good   Clinical Impairments Affecting Rehab Potential co-morbidities and hx of deficts after surgery for brain tumor at age 11   PT Frequency 1x / week   PT Duration 8 weeks   PT Treatment/Interventions ADLs/Self Care Home Management;Biofeedback;Electrical Stimulation;Neuromuscular re-education;Balance training;Therapeutic exercise;Passive range of motion;Therapeutic activities;Functional mobility training;Gait training;DME Instruction;Patient/family education;Orthotic Fit/Training;Wheelchair mobility training;Manual techniques   PT Next Visit Plan Fall prevention ed. Provide pt with stretches and strengthening HEP. Begin to balance train.   Consulted and Agree with Plan of Care Patient;Family member/caregiver   Family Member Consulted pt's sister-[name]      Patient will benefit from skilled therapeutic intervention in order to improve the following deficits and impairments:  Decreased endurance, Impaired tone, Decreased knowledge of use of DME, Decreased strength, Difficulty walking, Decreased coordination, Impaired flexibility, Postural dysfunction, Decreased balance, Decreased mobility  Visit Diagnosis: Muscle weakness (generalized) - Plan: PT plan of care cert/re-cert  Ataxic gait -  Plan: PT plan of care cert/re-cert  Other lack of coordination - Plan: PT plan of care cert/re-cert  Other abnormalities of gait and mobility - Plan: PT plan of care cert/re-cert      G-Codes - [id number]    Functional Assessment Tool Used min guard to min A during STS and ant/post and squat pivot txfs, pt with R lateral lean after seated 5 minutes in w/c.   Functional Limitation Changing and maintaining body position   Changing and Maintaining Body Position Goal Status [phone]) At least 60 percent but less than 80 percent impaired, limited or restricted   Changing and Maintaining Body Position Discharge Status [id number]) At least 20 percent but less than 40 percent impaired, limited or restricted       Problem List Patient Active Problem List   Diagnosis Date Noted  . Chronic constipation 03/21/2016  . Routine general medical examination at a health care facility 11/09/2015  . Loss of weight   . Vitamin D deficiency   . Osteoporosis, unspecified   . Hyperlipidemia   . Paroxysmal atrial fibrillation ([place]) 03/03/2012  . Pituitary disorder ([place]) 02/11/2012  . Ataxia 01/29/2012  . Anemia 11/21/2011  . Diastolic CHF, chronic ([place]) 11/13/2011  . Pleural effusion, left 11/11/2011  . S/P mitral valve repair 10/15/2011  . Brain tumor, astrocytoma ([place]) 10/15/2011  . Neuromuscular disorder ([place]) 10/15/2011  . Neurogenic bladder 10/15/2011  . Arthritis 10/15/2011  . Mitral regurgitation due to cusp prolapse 07/08/2011    Miller,Jennifer L 05/09/2016, 10:51 AM  [facility] [address] [place], [place], [address] Phone: [phone]   Fax:  [phone]  Name: KAHLANI GEORGI MRN: [id number] Date of Birth: August 20, 1956  Geoffry Paradise, PT,DPT 05/09/16 10:51 AM Phone: [phone] Fax: [phone]

## 2016-05-23 ENCOUNTER — Ambulatory Visit: Payer: Medicare Other | Attending: Family Medicine

## 2016-05-23 DIAGNOSIS — R2689 Other abnormalities of gait and mobility: Secondary | ICD-10-CM | POA: Diagnosis not present

## 2016-05-23 DIAGNOSIS — M6281 Muscle weakness (generalized): Secondary | ICD-10-CM | POA: Insufficient documentation

## 2016-05-23 DIAGNOSIS — R278 Other lack of coordination: Secondary | ICD-10-CM | POA: Diagnosis not present

## 2016-05-23 DIAGNOSIS — R260 Ataxic gait: Secondary | ICD-10-CM | POA: Insufficient documentation

## 2016-05-23 NOTE — Patient Instructions (Addendum)
HIP: Hamstrings - Short Sitting    Rest leg on raised surface. Keep knee straight. Lift chest. Hold _30__ seconds. Repeat with other leg. _3__ reps per set, _2-3__ sets per day, _7__ days per week  Copyright  VHI. All rights reserved.    Piriformis Stretch, Supine    Lie supine, one ankle crossed onto opposite knee. Holding bottom leg behind knee, gently pull legs toward chest until stretch is felt in buttock of top leg. Hold _30__ seconds. For deeper stretch gently push top knee away from body.  Repeat with other leg. Repeat _3__ times per session. Do _2-3__ sessions per day. Every day.  Copyright  VHI. All rights reserved.   Lower Trunk Rotation Stretch    Keeping back flat and feet together, rotate knees to left side. Hold __30__ seconds. Then rotate knees to the right side. Repeat __3__ times per set. Do __1__ sets per session. Do __2-3__ sessions per day. Perform every day.  http://orth.exer.us/123   Copyright  VHI. All rights reserved.    Fall Prevention in the Home  Falls can cause injuries and can affect people from all age groups. There are many simple things that you can do to make your home safe and to help prevent falls. WHAT CAN I DO ON THE OUTSIDE OF MY HOME?  Regularly repair the edges of walkways and driveways and fix any cracks.  Remove high doorway thresholds.  Trim any shrubbery on the main path into your home.  Use bright outdoor lighting.  Clear walkways of debris and clutter, including tools and rocks.  Regularly check that handrails are securely fastened and in good repair. Both sides of any steps should have handrails.  Install guardrails along the edges of any raised decks or porches.  Have leaves, snow, and ice cleared regularly.  Use sand or salt on walkways during winter months.  In the garage, clean up any spills right away, including grease or oil spills. WHAT CAN I DO IN THE BATHROOM?  Use night lights.  Install grab bars by  the toilet and in the tub and shower. Do not use towel bars as grab bars.  Use non-skid mats or decals on the floor of the tub or shower.  If you need to sit down while you are in the shower, use a plastic, non-slip stool.Marland Kitchen  Keep the floor dry. Immediately clean up any water that spills on the floor.  Remove soap buildup in the tub or shower on a regular basis.  Attach bath mats securely with double-sided non-slip rug tape.  Remove throw rugs and other tripping hazards from the floor. WHAT CAN I DO IN THE BEDROOM?  Use night lights.  Make sure that a bedside light is easy to reach.  Do not use oversized bedding that drapes onto the floor.  Have a firm chair that has side arms to use for getting dressed.  Remove throw rugs and other tripping hazards from the floor. WHAT CAN I DO IN THE KITCHEN?   Clean up any spills right away.  Avoid walking on wet floors.  Place frequently used items in easy-to-reach places.  If you need to reach for something above you, use a sturdy step stool that has a grab bar.  Keep electrical cables out of the way.  Do not use floor polish or wax that makes floors slippery. If you have to use wax, make sure that it is non-skid floor wax.  Remove throw rugs and other tripping hazards from the  floor. WHAT CAN I DO IN THE STAIRWAYS?  Do not leave any items on the stairs.  Make sure that there are handrails on both sides of the stairs. Fix handrails that are broken or loose. Make sure that handrails are as long as the stairways.  Check any carpeting to make sure that it is firmly attached to the stairs. Fix any carpet that is loose or worn.  Avoid having throw rugs at the top or bottom of stairways, or secure the rugs with carpet tape to prevent them from moving.  Make sure that you have a light switch at the top of the stairs and the bottom of the stairs. If you do not have them, have them installed. WHAT ARE SOME OTHER FALL PREVENTION  TIPS?  Wear closed-toe shoes that fit well and support your feet. Wear shoes that have rubber soles or low heels.  When you use a stepladder, make sure that it is completely opened and that the sides are firmly locked. Have someone hold the ladder while you are using it. Do not climb a closed stepladder.  Add color or contrast paint or tape to grab bars and handrails in your home. Place contrasting color strips on the first and last steps.  Use mobility aids as needed, such as canes, walkers, scooters, and crutches.  Turn on lights if it is dark. Replace any light bulbs that burn out.  Set up furniture so that there are clear paths. Keep the furniture in the same spot.  Fix any uneven floor surfaces.  Choose a carpet design that does not hide the edge of steps of a stairway.  Be aware of any and all pets.  Review your medicines with your healthcare provider. Some medicines can cause dizziness or changes in blood pressure, which increase your risk of falling. Talk with your health care provider about other ways that you can decrease your risk of falls. This may include working with a physical therapist or trainer to improve your strength, balance, and endurance.   This information is not intended to replace advice given to you by your health care provider. Make sure you discuss any questions you have with your health care provider.   Document Released: 08/16/2002 Document Revised: 01/10/2015 Document Reviewed: 09/30/2014 Elsevier Interactive Patient Education [employer].

## 2016-05-23 NOTE — Therapy (Signed)
[facility] [address] [place], [place], [address] Phone: [phone]   Fax:  [phone]  Physical Therapy Treatment  Patient Details  Name: Michaela [name] MRN: [id number] Date of Birth: 10-Mar-1956 Referring Provider: Dr. Martinique  Encounter Date: 05/23/2016      PT End of Session - 05/23/16 1217    Visit Number 2   Number of Visits 9   Date for PT Re-Evaluation 07/08/16   Authorization Type Medicare and Medicaid-G-CODE EVERY 10TH VISIT AND PROGRESS NOTE   PT Start Time [phone]  pt arrived late   PT Stop Time 1015   PT Time Calculation (min) 31 min   Activity Tolerance Patient tolerated treatment well   Behavior During Therapy [facility] for tasks assessed/performed      Past Medical History:  Diagnosis Date  . 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 C. difficile colitis [id number]   complicated by sepsis/ARDS and prolonged hosp  . Hyperlipidemia   . Mitral regurgitation 10/2011   severe, s/p min invasive repair and annuloplasty  . Neuromuscular disorder ([facility])    numbness in hands and feet  . Osteoarthritis   . Osteoporosis    DEXA @ [facility] 11/2012: -3.5, prior tx Reclast, last in 2011, rec change to Prolia  . Overactive bladder   . Pleural effusion, left 11/11/2011  . Vitamin D deficiency     Past Surgical History:  Procedure Laterality Date  . CARDIAC CATHETERIZATION     Jan 2013  . KNEE ARTHROSCOPY     right  . MITRAL VALVE REPAIR  10/15/2011   Procedure: MINIMALLY INVASIVE MITRAL VALVE REPAIR (MVR);  Surgeon: Rexene Alberts, MD;  Location: [facility];  Service: Open Heart Surgery;  Laterality: Right;  . TRANSESOPHAGEAL ECHOCARDIOGRAM  7/12  . US ECHOCARDIOGRAPHY  6/12  . VENTRICULOPERITONEAL SHUNT  1971    There were no vitals filed for this visit.      Subjective Assessment - 05/23/16 0947    Subjective Pt denied falls or changes since last visit. Pt reported difficulty  getting back to upright position when bending down.    Patient is accompained by: Family member  [name]-sister   Pertinent History A-fib (mitral valve sx in 2013), hx of astrocytoma (sx at age 37), osteoporosis, arthritis, neurogenic bladder, ataxia, CHF   Patient Stated Goals "I would like to be able to steady myself and reduce the shaking" Pt would also like to know what else she can do at the [facility].   Currently in Pain? No/denies               Therex: Pt performed stretching HEP with cues and demo for technique. Please see pt instructions for details. No c/o pain.             Self Care:     PT Education - 05/23/16 1216    Education provided Yes   Education Details PT provided pt with fall prevention education and stretching HEP.   Person(s) Educated Patient;Other (comment)  sister   Methods Explanation;Demonstration;Tactile cues;Verbal cues;Handout   Comprehension Returned demonstration;Verbalized understanding          PT Short Term Goals - 05/23/16 1219      PT SHORT TERM GOAL #1   Title Pt will be IND in initial HEP to improve strength and flexibility. TARGET DATE FOR ALL STGS: 06/06/16   Status On-going     PT SHORT TERM GOAL #2  Title Pt will perform STS txf with B UE support and S to improve functional mobility.    Status On-going     PT SHORT TERM GOAL #3   Title Pt will stand at kitchen counter for 5 minutes, at MOD I level, in order to perform ADLs.    Status On-going           PT Long Term Goals - 05/23/16 1219      PT LONG TERM GOAL #1   Title Pt will be IND in progressed HEP to improve strength, balance, and flexilbility. TARGET DATE FOR ALL LTGS: 07/04/16   Status On-going     PT LONG TERM GOAL #2   Title Pt will perform static standing with 1 UE support, MOD I level, at kitchen sink to perform ADLs without rest break.    Status On-going     PT LONG TERM GOAL #3   Title Pt will perform STS txf at MOD I level to improve  functional mobility.   Status On-going     PT LONG TERM GOAL #4   Title Pt will perform ant/post w/c to mat txf at MOD I level to improve functional mobility.    Status On-going     PT LONG TERM GOAL #5   Title Attempt amb. if appropriate and write goal.   Status On-going               Plan - 05/23/16 1217    Clinical Impression Statement Pt tolerated stretching HEP well but did require cues for technique and to sustain hold for full 30 sec. Pt continues to require min guard to S during txfs 2/2 impaired balance and ataxia. Continue with POC.    Rehab Potential Good   Clinical Impairments Affecting Rehab Potential co-morbidities and hx of deficts after surgery for brain tumor at age 54   PT Frequency 1x / week   PT Duration 8 weeks   PT Treatment/Interventions ADLs/Self Care Home Management;Biofeedback;Electrical Stimulation;Neuromuscular re-education;Balance training;Therapeutic exercise;Passive range of motion;Therapeutic activities;Functional mobility training;Gait training;DME Instruction;Patient/family education;Orthotic Fit/Training;Wheelchair mobility training;Manual techniques   PT Next Visit Plan Initiate strengthening and balance HEP.    PT Home Exercise Plan Stretching HEP.   Consulted and Agree with Plan of Care Patient;Family member/caregiver   Family Member Consulted pt's sister-[name]      Patient will benefit from skilled therapeutic intervention in order to improve the following deficits and impairments:  Decreased endurance, Impaired tone, Decreased knowledge of use of DME, Decreased strength, Difficulty walking, Decreased coordination, Impaired flexibility, Postural dysfunction, Decreased balance, Decreased mobility  Visit Diagnosis: Other abnormalities of gait and mobility  Other lack of coordination     Problem List Patient Active Problem List   Diagnosis Date Noted  . Chronic constipation 03/21/2016  . Routine general medical examination at a health  care facility 11/09/2015  . Loss of weight   . Vitamin D deficiency   . Osteoporosis, unspecified   . Hyperlipidemia   . Paroxysmal atrial fibrillation ([place]) 03/03/2012  . Pituitary disorder ([place]) 02/11/2012  . Ataxia 01/29/2012  . Anemia 11/21/2011  . Diastolic CHF, chronic ([place]) 11/13/2011  . Pleural effusion, left 11/11/2011  . S/P mitral valve repair 10/15/2011  . Brain tumor, astrocytoma ([place]) 10/15/2011  . Neuromuscular disorder ([place]) 10/15/2011  . Neurogenic bladder 10/15/2011  . Arthritis 10/15/2011  . Mitral regurgitation due to cusp prolapse 07/08/2011    Miller,Jennifer L 05/23/2016, 12:20 PM  [facility] [address]  [place], [place], [address] Phone: [phone]   Fax:  [phone]  Name: Michaela [name] MRN: [id number] Date of Birth: 11-03-1955  Geoffry Paradise, PT,DPT 05/23/16 12:21 PM Phone: [phone] Fax: [phone]

## 2016-05-31 ENCOUNTER — Ambulatory Visit: Payer: Medicare Other

## 2016-06-05 ENCOUNTER — Ambulatory Visit: Payer: Medicare Other

## 2016-06-05 DIAGNOSIS — R2689 Other abnormalities of gait and mobility: Secondary | ICD-10-CM | POA: Diagnosis not present

## 2016-06-05 DIAGNOSIS — R260 Ataxic gait: Secondary | ICD-10-CM | POA: Diagnosis not present

## 2016-06-05 DIAGNOSIS — R278 Other lack of coordination: Secondary | ICD-10-CM | POA: Diagnosis not present

## 2016-06-05 DIAGNOSIS — M6281 Muscle weakness (generalized): Secondary | ICD-10-CM

## 2016-06-05 NOTE — Patient Instructions (Addendum)
Functional Quadriceps: Sit to Stand    Perform at kitchen, holding bar once you stand up, placing hands on wheelchair armrest in order to stand, then reaching back to armrests to sit back down. Sit on edge of chair, feet flat on floor. Stand upright, extending knees fully. Hold for 10 seconds. Repeat __5__ times per set. Do __2__ sets per session. Do __1__ sessions per day.  http://orth.exer.us/735   Copyright  VHI. All rights reserved.    Weight Shift: Lateral (Limits of Stability)    Hold onto the kitchen bar at your sink. Slowly shift weight (move hips back and forth) to right as far as possible, without taking a step. Return to starting position. Shift to opposite side. Hold each position __2__ seconds. Repeat __10__ times per session. Do __1__ sessions per day.  Copyright  VHI. All rights reserved.     Weight Shift: Anterior / Posterior (Limits of Stability)    Hold onto bar at kitchen sink. Slowly shift weight backwards (using hips only), while keeping feet flat on the floor. Return to starting position. Shift weight slowly forward (using hips), while keeping feet flat.. Hold each position __2__ seconds. Repeat __10__ times per session. Do __1__ sessions per day.   Copyright  VHI. All rights reserved.

## 2016-06-05 NOTE — Therapy (Signed)
[name] [address] [place], [place], [address] Phone: [phone]   Fax:  [phone]  Physical Therapy Treatment  Patient Details  Name: Michaela [name] MRN: [id number] Date of Birth: 01-24-1956 Referring Provider: Dr. Martinique  Encounter Date: 06/05/2016      PT End of Session - 06/05/16 1025    Visit Number 3   Number of Visits 9   Date for PT Re-Evaluation 07/08/16   Authorization Type Medicare and Medicaid-G-CODE EVERY 10TH VISIT AND PROGRESS NOTE   PT Start Time 62  pt arrived late   PT Stop Time 1015   PT Time Calculation (min) 40 min   Equipment Utilized During Treatment Gait belt   Activity Tolerance Patient tolerated treatment well   Behavior During Therapy WFL for tasks assessed/performed      Past Medical History:  Diagnosis Date  . Astrocytoma brain tumor ([name]) 1971   astrocytoma treated with radiation  . C. difficile colitis 09/7406   complicated by sepsis/ARDS and prolonged hosp  . Hyperlipidemia   . Mitral regurgitation 10/2011   severe, s/p min invasive repair and annuloplasty  . Neuromuscular disorder (H[facility])    numbness in hands and feet  . Osteoarthritis   . Osteoporosis    DEXA @ [facility] 11/2012: -3.5, prior tx Reclast, last in 2011, rec change to Prolia  . Overactive bladder   . Pleural effusion, left 11/11/2011  . Vitamin D deficiency     Past Surgical History:  Procedure Laterality Date  . CARDIAC CATHETERIZATION     Jan 2013  . KNEE ARTHROSCOPY     right  . MITRAL VALVE REPAIR  10/15/2011   Procedure: MINIMALLY INVASIVE MITRAL VALVE REPAIR (MVR);  Surgeon: Rexene Alberts, MD;  Location: [facility];  Service: Open Heart Surgery;  Laterality: Right;  . TRANSESOPHAGEAL ECHOCARDIOGRAM  7/12  . US ECHOCARDIOGRAPHY  6/12  . VENTRICULOPERITONEAL SHUNT  1971    There were no vitals filed for this visit.      Subjective Assessment - 06/05/16 0941    Subjective Pt reported she has  been performing hip stretches and it has reduced hip pain. Pt denied falls or changes since last visit. Pt reported she is now able to stand approx. 20 minutes at kitchen sink to perform ADLs vs. < 5 minutes prior to PT.   Patient is accompained by: Family member  [name]-sister   Pertinent History A-fib (mitral valve sx in 2013), hx of astrocytoma (sx at age 60), osteoporosis, arthritis, neurogenic bladder, ataxia, CHF   Patient Stated Goals "I would like to be able to steady myself and reduce the shaking" Pt would also like to know what else she can do at the [facility].   Currently in Pain? No/denies          Neuro re-ed: Pt performed balance/strengthening HEP with min guard to S for safety. Chair placed in front of pt. Multi-modal cues and demo for technique. Pt required cues to improve ant. Weight shifting during STS txfs. Pt required rest breaks to allow ataxia to subside, as it increased during prolonged standing and pt experienced difficulty maintaining balance.  Incr. Time required to allow time for processing.                       PT Education - 06/05/16 1025    Education provided Yes   Education Details PT provided pt with balance and functional strengthening HEP. PT discussed goal progress,  and the importance of continuing HEP.   Person(s) Educated Patient;Other (comment)  sister-[name]   Methods Explanation;Demonstration;Tactile cues;Verbal cues;Handout   Comprehension Verbalized understanding;Returned demonstration          PT Short Term Goals - 06/05/16 1030      PT SHORT TERM GOAL #1   Title Pt will be IND in initial HEP to improve strength and flexibility. TARGET DATE FOR ALL STGS: 06/06/16   Status Partially Met     PT SHORT TERM GOAL #2   Title Pt will perform STS txf with B UE support and S to improve functional mobility.    Status Partially Met     PT SHORT TERM GOAL #3   Title Pt will stand at kitchen counter for 5 minutes, at MOD I level, in order  to perform ADLs.    Status Achieved           PT Long Term Goals - 05/23/16 1219      PT LONG TERM GOAL #1   Title Pt will be IND in progressed HEP to improve strength, balance, and flexilbility. TARGET DATE FOR ALL LTGS: 07/04/16   Status On-going     PT LONG TERM GOAL #2   Title Pt will perform static standing with 1 UE support, MOD I level, at kitchen sink to perform ADLs without rest break.    Status On-going     PT LONG TERM GOAL #3   Title Pt will perform STS txf at MOD I level to improve functional mobility.   Status On-going     PT LONG TERM GOAL #4   Title Pt will perform ant/post w/c to mat txf at MOD I level to improve functional mobility.    Status On-going     PT LONG TERM GOAL #5   Title Attempt amb. if appropriate and write goal.   Status On-going               Plan - 06/05/16 1026    Clinical Impression Statement Pt demonstrated progress as she partially met STGs 1 and 2 and met STG 3. Pt tolerated functional strengthening and balance HEP well, as she denied pain. Pt did require frequent multi-modal cues for technique. Pt also required seated rest breaks 2/2 fatigue and to allow ataxia to decr, as ataxia incr. with prolonged standing. Pt would continue to benefit from skilled PT to improve safety during functional mobility and IND.    Rehab Potential Good   Clinical Impairments Affecting Rehab Potential co-morbidities and hx of deficts after surgery for brain tumor at age 60   PT Frequency 1x / week   PT Duration 8 weeks   PT Treatment/Interventions ADLs/Self Care Home Management;Biofeedback;Electrical Stimulation;Neuromuscular re-education;Balance training;Therapeutic exercise;Passive range of motion;Therapeutic activities;Functional mobility training;Gait training;DME Instruction;Patient/family education;Orthotic Fit/Training;Wheelchair mobility training;Manual techniques   PT Next Visit Plan Continue LE strengthening and add core strengthening. Add to  HEP if tolerated and pt able to perform safely.   PT Home Exercise Plan Stretching , strengthening and balance HEP.   Consulted and Agree with Plan of Care Patient;Family member/caregiver   Family Member Consulted pt's sister-[name]      Patient will benefit from skilled therapeutic intervention in order to improve the following deficits and impairments:  Decreased endurance, Impaired tone, Decreased knowledge of use of DME, Decreased strength, Difficulty walking, Decreased coordination, Impaired flexibility, Postural dysfunction, Decreased balance, Decreased mobility  Visit Diagnosis: Other lack of coordination  Muscle weakness (generalized)  Other abnormalities of gait and  mobility  Ataxic gait     Problem List Patient Active Problem List   Diagnosis Date Noted  . Chronic constipation 03/21/2016  . Routine general medical examination at a health care facility 11/09/2015  . Loss of weight   . Vitamin D deficiency   . Osteoporosis, unspecified   . Hyperlipidemia   . Paroxysmal atrial fibrillation ([place]) 03/03/2012  . Pituitary disorder ([place]) 02/11/2012  . Ataxia 01/29/2012  . Anemia 11/21/2011  . Diastolic CHF, chronic ([place]) 11/13/2011  . Pleural effusion, left 11/11/2011  . S/P mitral valve repair 10/15/2011  . Brain tumor, astrocytoma ([place]) 10/15/2011  . Neuromuscular disorder ([place]) 10/15/2011  . Neurogenic bladder 10/15/2011  . Arthritis 10/15/2011  . Mitral regurgitation due to cusp prolapse 07/08/2011    Miller,Jennifer L 06/05/2016, 10:32 AM  [facility] [address] [place], [place], [address] Phone: [phone]   Fax:  [phone]  Name: THEORA VANKIRK MRN: [id number] Date of Birth: August 01, 1956  Geoffry Paradise, PT,DPT 06/05/16 10:34 AM Phone: [phone] Fax: [phone]

## 2016-06-13 ENCOUNTER — Ambulatory Visit: Payer: Medicare Other | Attending: Family Medicine

## 2016-06-13 DIAGNOSIS — M6281 Muscle weakness (generalized): Secondary | ICD-10-CM | POA: Insufficient documentation

## 2016-06-13 DIAGNOSIS — R2689 Other abnormalities of gait and mobility: Secondary | ICD-10-CM | POA: Insufficient documentation

## 2016-06-13 DIAGNOSIS — R278 Other lack of coordination: Secondary | ICD-10-CM | POA: Insufficient documentation

## 2016-06-13 NOTE — Therapy (Signed)
[facility] [address] [place], [place], [address] Phone: [phone]   Fax:  [phone]  Physical Therapy Treatment  Patient Details  Name: Michaela [name] MRN: [id number] Date of Birth: 05/23/1956 Referring Provider: Dr. Martinique  Encounter Date: 06/13/2016      PT End of Session - 06/13/16 0946    Visit Number 4   Number of Visits 9   Date for PT Re-Evaluation 07/08/16   Authorization Type Medicare and Medicaid-G-CODE EVERY 10TH VISIT AND PROGRESS NOTE   PT Start Time [phone]  pt arrived late   PT Stop Time 1015   PT Time Calculation (min) 39 min   Activity Tolerance Patient tolerated treatment well   Behavior During Therapy [facility] for tasks assessed/performed      Past Medical History:  Diagnosis Date  . Astrocytoma brain tumor ([place]) 1971   astrocytoma treated with radiation  . C. difficile colitis 04/9380   complicated by sepsis/ARDS and prolonged hosp  . Hyperlipidemia   . Mitral regurgitation 10/2011   severe, s/p min invasive repair and annuloplasty  . Neuromuscular disorder (H[facility])    numbness in hands and feet  . Osteoarthritis   . Osteoporosis    DEXA @ [facility] 11/2012: -3.5, prior tx Reclast, last in 2011, rec change to Prolia  . Overactive bladder   . Pleural effusion, left 11/11/2011  . Vitamin D deficiency     Past Surgical History:  Procedure Laterality Date  . CARDIAC CATHETERIZATION     Jan 2013  . KNEE ARTHROSCOPY     right  . MITRAL VALVE REPAIR  10/15/2011   Procedure: MINIMALLY INVASIVE MITRAL VALVE REPAIR (MVR);  Surgeon: Rexene Alberts, MD;  Location: [facility];  Service: Open Heart Surgery;  Laterality: Right;  . TRANSESOPHAGEAL ECHOCARDIOGRAM  7/12  . US ECHOCARDIOGRAPHY  6/12  . VENTRICULOPERITONEAL SHUNT  1971    There were no vitals filed for this visit.      Subjective Assessment - 06/13/16 0938    Subjective Pt denied falls or changes since last visist. Pt reports she has been  performing balance activities at counter and had a few questions.    Patient is accompained by: Family member  [name]-sister, in lobby   Pertinent History A-fib (mitral valve sx in 2013), hx of astrocytoma (sx at age 34), osteoporosis, arthritis, neurogenic bladder, ataxia, CHF   Patient Stated Goals "I would like to be able to steady myself and reduce the shaking" Pt would also like to know what else she can do at the [facility].   Currently in Pain? No/denies                         [facility] Adult PT Treatment/Exercise - 06/13/16 0949      Exercises   Exercises Knee/Hip     Knee/Hip Exercises: Stretches   Active Hamstring Stretch Both;2 reps;60 seconds     Knee/Hip Exercises: Supine   Bridges Strengthening;Both;3 sets;10 reps   Bridges Limitations Cues to for technique.   Other Supine Knee/Hip Exercises DKTC with BLEs on physioball, 3x10 with cues for technique.     Knee/Hip Exercises: Sidelying   Clams Cues for technique, 3x10 reps.        PT added bridges to HEP, please see pt instructions for details.          PT Short Term Goals - 06/05/16 1030      PT SHORT TERM GOAL #1  Title Pt will be IND in initial HEP to improve strength and flexibility. TARGET DATE FOR ALL STGS: 06/06/16   Status Partially Met     PT SHORT TERM GOAL #2   Title Pt will perform STS txf with B UE support and S to improve functional mobility.    Status Partially Met     PT SHORT TERM GOAL #3   Title Pt will stand at kitchen counter for 5 minutes, at MOD I level, in order to perform ADLs.    Status Achieved           PT Long Term Goals - 05/23/16 1219      PT LONG TERM GOAL #1   Title Pt will be IND in progressed HEP to improve strength, balance, and flexilbility. TARGET DATE FOR ALL LTGS: 07/04/16   Status On-going     PT LONG TERM GOAL #2   Title Pt will perform static standing with 1 UE support, MOD I level, at kitchen sink to perform ADLs without rest break.    Status  On-going     PT LONG TERM GOAL #3   Title Pt will perform STS txf at MOD I level to improve functional mobility.   Status On-going     PT LONG TERM GOAL #4   Title Pt will perform ant/post w/c to mat txf at MOD I level to improve functional mobility.    Status On-going     PT LONG TERM GOAL #5   Title Attempt amb. if appropriate and write goal.   Status On-going               Plan - 06/13/16 0947    Clinical Impression Statement Pt tolerated strengthening HEP well. Pt did require B hamstring stretches after bridges to decr. hamstring cramping. Pt required incr. time to allow for processing. Pt would continue to benefit from skilled PT to improve safety during functional mobility.    Rehab Potential Good   Clinical Impairments Affecting Rehab Potential co-morbidities and hx of deficts after surgery for brain tumor at age 61   PT Frequency 1x / week   PT Duration 8 weeks   PT Next Visit Plan Continue LE strengthening and core strengthening and standing balance activities.    PT Home Exercise Plan Stretching , strengthening and balance HEP.   Consulted and Agree with Plan of Care Patient      Patient will benefit from skilled therapeutic intervention in order to improve the following deficits and impairments:  Decreased endurance, Impaired tone, Decreased knowledge of use of DME, Decreased strength, Difficulty walking, Decreased coordination, Impaired flexibility, Postural dysfunction, Decreased balance, Decreased mobility  Visit Diagnosis: Muscle weakness (generalized)  Other lack of coordination  Other abnormalities of gait and mobility     Problem List Patient Active Problem List   Diagnosis Date Noted  . Chronic constipation 03/21/2016  . Routine general medical examination at a health care facility 11/09/2015  . Loss of weight   . Vitamin D deficiency   . Osteoporosis, unspecified   . Hyperlipidemia   . Paroxysmal atrial fibrillation ([place]) 03/03/2012  .  Pituitary disorder ([place]) 02/11/2012  . Ataxia 01/29/2012  . Anemia 11/21/2011  . Diastolic CHF, chronic ([place]) 11/13/2011  . Pleural effusion, left 11/11/2011  . S/P mitral valve repair 10/15/2011  . Brain tumor, astrocytoma ([place]) 10/15/2011  . Neuromuscular disorder ([place]) 10/15/2011  . Neurogenic bladder 10/15/2011  . Arthritis 10/15/2011  . Mitral regurgitation due to cusp prolapse 07/08/2011  Miller,Jennifer L 06/13/2016, 10:14 AM  [facility] [address] [place], [place], [address] Phone: [phone]   Fax:  [phone]  Name: JACLYNNE BALDO MRN: [id number] Date of Birth: 1956/08/25  Geoffry Paradise, PT,DPT 06/13/16 10:14 AM Phone: [phone] Fax: [phone]

## 2016-06-13 NOTE — Patient Instructions (Signed)
Bridge    Lie back, legs bent. Tuck in stomach and then press hips up towards ceiling. Hold for 2 seconds. Then slowly lower hips back down.  Repeat __10__ times. Perform 3 sets of 10 reps. Do __3-4__ sessions per week.  http://pm.exer.us/55   Copyright  VHI. All rights reserved.

## 2016-06-18 ENCOUNTER — Ambulatory Visit: Payer: Medicare Other

## 2016-06-18 ENCOUNTER — Other Ambulatory Visit: Payer: Self-pay | Admitting: Internal Medicine

## 2016-06-18 ENCOUNTER — Other Ambulatory Visit: Payer: Self-pay | Admitting: Family Medicine

## 2016-06-18 DIAGNOSIS — K5909 Other constipation: Secondary | ICD-10-CM

## 2016-06-18 NOTE — Telephone Encounter (Signed)
Okay to refill? 

## 2016-06-19 ENCOUNTER — Encounter: Payer: Self-pay | Admitting: Internal Medicine

## 2016-06-20 ENCOUNTER — Ambulatory Visit: Payer: Medicare Other

## 2016-06-20 DIAGNOSIS — R2689 Other abnormalities of gait and mobility: Secondary | ICD-10-CM | POA: Diagnosis not present

## 2016-06-20 DIAGNOSIS — R278 Other lack of coordination: Secondary | ICD-10-CM

## 2016-06-20 DIAGNOSIS — M6281 Muscle weakness (generalized): Secondary | ICD-10-CM | POA: Diagnosis not present

## 2016-06-20 NOTE — Patient Instructions (Signed)
Hip (Front)    Begin sitting tall, both feet flat on floor. Tuck in your stomach, but still be able to breathe comfortably. Keeping upper body straight and still, march right knee and then march the left knee (one knee at a time). Slowly return to starting position. Repeat __10__ times each leg. Do __2__ sets per session. Do __3-4__ sessions per week.  Copyright  VHI. All rights reserved.

## 2016-06-20 NOTE — Therapy (Signed)
[facility] [address] [place], [place], [address] Phone: [phone]   Fax:  [phone]  Physical Therapy Treatment  Patient Details  Name: Michaela [name] MRN: [id number] Date of Birth: Oct 02, 1955 Referring Provider: Dr. Martinique  Encounter Date: 06/20/2016      PT End of Session - 06/20/16 1303    Visit Number 5   Number of Visits 9   Date for PT Re-Evaluation 07/08/16   Authorization Type Medicare and Medicaid-G-CODE EVERY 10TH VISIT AND PROGRESS NOTE   PT Start Time [phone]  pt late   PT Stop Time 1014   PT Time Calculation (min) 40 min   Activity Tolerance Patient tolerated treatment well   Behavior During Therapy [facility] for tasks assessed/performed      Past Medical History:  Diagnosis Date  . Astrocytoma brain tumor ([place]) 1971   astrocytoma treated with radiation  . C. difficile colitis 04/7680   complicated by sepsis/ARDS and prolonged hosp  . Hyperlipidemia   . Mitral regurgitation 10/2011   severe, s/p min invasive repair and annuloplasty  . Neuromuscular disorder (HCC)    numbness in hands and feet  . Osteoarthritis   . Osteoporosis    DEXA @ [facility] 11/2012: -3.5, prior tx Reclast, last in 2011, rec change to Prolia  . Overactive bladder   . Pleural effusion, left 11/11/2011  . Vitamin D deficiency     Past Surgical History:  Procedure Laterality Date  . CARDIAC CATHETERIZATION     Jan 2013  . KNEE ARTHROSCOPY     right  . MITRAL VALVE REPAIR  10/15/2011   Procedure: MINIMALLY INVASIVE MITRAL VALVE REPAIR (MVR);  Surgeon: Rexene Alberts, MD;  Location: [facility];  Service: Open Heart Surgery;  Laterality: Right;  . TRANSESOPHAGEAL ECHOCARDIOGRAM  7/12  . US ECHOCARDIOGRAPHY  6/12  . VENTRICULOPERITONEAL SHUNT  1971    There were no vitals filed for this visit.      Subjective Assessment - 06/20/16 0939    Subjective Pt denied falls or changes since last visit.    Pertinent History A-fib  (mitral valve sx in 2013), hx of astrocytoma (sx at age 20), osteoporosis, arthritis, neurogenic bladder, ataxia, CHF   Patient Stated Goals "I would like to be able to steady myself and reduce the shaking" Pt would also like to know what else she can do at the [facility].   Currently in Pain? No/denies                         [facility] Adult PT Treatment/Exercise - 06/20/16 1300      Exercises   Exercises Knee/Hip     Knee/Hip Exercises: Seated   Marching Both;Strengthening;2 sets;10 reps   Marching Limitations Cues for upright posture and to engage TrA to improve core strength. See HEP for details.     Knee/Hip Exercises: Supine   Bridges Strengthening;3 sets;10 reps   Bridges Limitations Cues to engage TrA to improve core strength and to improve glute clearance.   Other Supine Knee/Hip Exercises DKTC with BLEs on physioball, 3x10 with cues for technique.   Other Supine Knee/Hip Exercises In quadraped: pt performed 2x10reps of alternating B shoulder flexion with TrA engaged to improve core strength. Cues for technique and min A to min guard to ensure safety and to remain in quadruped.                PT Education - 06/20/16 1303  Education provided Yes   Education Details PT reviewed HEP with TrA engaged to improve core strength and added seated marches with TrA activation.    Person(s) Educated Patient   Methods Explanation;Demonstration;Verbal cues;Tactile cues;Handout   Comprehension Verbalized understanding;Returned demonstration;Need further instruction          PT Short Term Goals - 06/05/16 1030      PT SHORT TERM GOAL #1   Title Pt will be IND in initial HEP to improve strength and flexibility. TARGET DATE FOR ALL STGS: 06/06/16   Status Partially Met     PT SHORT TERM GOAL #2   Title Pt will perform STS txf with B UE support and S to improve functional mobility.    Status Partially Met     PT SHORT TERM GOAL #3   Title Pt will stand at kitchen  counter for 5 minutes, at MOD I level, in order to perform ADLs.    Status Achieved           PT Long Term Goals - 05/23/16 1219      PT LONG TERM GOAL #1   Title Pt will be IND in progressed HEP to improve strength, balance, and flexilbility. TARGET DATE FOR ALL LTGS: 07/04/16   Status On-going     PT LONG TERM GOAL #2   Title Pt will perform static standing with 1 UE support, MOD I level, at kitchen sink to perform ADLs without rest break.    Status On-going     PT LONG TERM GOAL #3   Title Pt will perform STS txf at MOD I level to improve functional mobility.   Status On-going     PT LONG TERM GOAL #4   Title Pt will perform ant/post w/c to mat txf at MOD I level to improve functional mobility.    Status On-going     PT LONG TERM GOAL #5   Title Attempt amb. if appropriate and write goal.   Status On-going               Plan - 06/20/16 1304    Clinical Impression Statement Pt demonstrated progress as she was able to progress to quadruped exercises, but did require min A to maintain balance. Pt continues to required rest breaks to allow time for processing and 2/2 fatigue. PT will attempt core strengtheing exercises on physioball next session. Continue with POC.    Rehab Potential Good   Clinical Impairments Affecting Rehab Potential co-morbidities and hx of deficts after surgery for brain tumor at age 27   PT Frequency 1x / week   PT Duration 8 weeks   PT Next Visit Plan Continue LE strengthening and core strengthening on physioball and standing balance activities.    PT Home Exercise Plan Stretching , strengthening and balance HEP.   Consulted and Agree with Plan of Care Patient      Patient will benefit from skilled therapeutic intervention in order to improve the following deficits and impairments:  Decreased endurance, Impaired tone, Decreased knowledge of use of DME, Decreased strength, Difficulty walking, Decreased coordination, Impaired flexibility,  Postural dysfunction, Decreased balance, Decreased mobility  Visit Diagnosis: Muscle weakness (generalized)  Other lack of coordination     Problem List Patient Active Problem List   Diagnosis Date Noted  . Chronic constipation 03/21/2016  . Routine general medical examination at a health care facility 11/09/2015  . Loss of weight   . Vitamin D deficiency   . Osteoporosis, unspecified   .  Hyperlipidemia   . Paroxysmal atrial fibrillation ([place]) 03/03/2012  . Pituitary disorder ([place]) 02/11/2012  . Ataxia 01/29/2012  . Anemia 11/21/2011  . Diastolic CHF, chronic ([place]) 11/13/2011  . Pleural effusion, left 11/11/2011  . S/P mitral valve repair 10/15/2011  . Brain tumor, astrocytoma ([place]) 10/15/2011  . Neuromuscular disorder ([place]) 10/15/2011  . Neurogenic bladder 10/15/2011  . Arthritis 10/15/2011  . Mitral regurgitation due to cusp prolapse 07/08/2011    Anquinette Pierro L 06/20/2016, 1:05 PM  [facility] [address] [place], [place], [address] Phone: [phone]   Fax:  [phone]  Name: SHAGUANA LOVE MRN: [id number] Date of Birth: 1956/06/08  Geoffry Paradise, PT,DPT 06/20/16 1:06 PM Phone: [phone] Fax: [phone]

## 2016-06-25 ENCOUNTER — Ambulatory Visit: Payer: Medicare Other

## 2016-06-27 ENCOUNTER — Ambulatory Visit: Payer: Medicare Other

## 2016-06-27 DIAGNOSIS — R278 Other lack of coordination: Secondary | ICD-10-CM

## 2016-06-27 DIAGNOSIS — R2689 Other abnormalities of gait and mobility: Secondary | ICD-10-CM

## 2016-06-27 DIAGNOSIS — M6281 Muscle weakness (generalized): Secondary | ICD-10-CM

## 2016-06-27 NOTE — Therapy (Signed)
[facility] [address] [place], [place], [address] Phone: [phone]   Fax:  [phone]  Physical Therapy Treatment  Patient Details  Name: Michaela [name] MRN: [id number] Date of Birth: 04/25/1956 Referring Provider: Dr. Martinique  Encounter Date: 06/27/2016      PT End of Session - 06/27/16 1214    Visit Number 6   Number of Visits 9   Date for PT Re-Evaluation 07/08/16   Authorization Type Medicare and Medicaid-G-CODE EVERY 10TH VISIT AND PROGRESS NOTE   PT Start Time 12  pt arrived late   PT Stop Time 1014  pt had to use bathroom   PT Time Calculation (min) 34 min   Equipment Utilized During Treatment Gait belt   Activity Tolerance Patient tolerated treatment well   Behavior During Therapy W[facility] for tasks assessed/performed      Past Medical History:  Diagnosis Date  . Astrocytoma brain tumor ([place]) 1971   astrocytoma treated with radiation  . C. difficile colitis 10/6832   complicated by sepsis/ARDS and prolonged hosp  . Hyperlipidemia   . Mitral regurgitation 10/2011   severe, s/p min invasive repair and annuloplasty  . Neuromuscular disorder ([facility])    numbness in hands and feet  . Osteoarthritis   . Osteoporosis    DEXA @ [facility] 11/2012: -3.5, prior tx Reclast, last in 2011, rec change to Prolia  . Overactive bladder   . Pleural effusion, left 11/11/2011  . Vitamin D deficiency     Past Surgical History:  Procedure Laterality Date  . CARDIAC CATHETERIZATION     Jan 2013  . KNEE ARTHROSCOPY     right  . MITRAL VALVE REPAIR  10/15/2011   Procedure: MINIMALLY INVASIVE MITRAL VALVE REPAIR (MVR);  Surgeon: Rexene Alberts, MD;  Location: [facility];  Service: Open Heart Surgery;  Laterality: Right;  . TRANSESOPHAGEAL ECHOCARDIOGRAM  7/12  . US ECHOCARDIOGRAPHY  6/12  . VENTRICULOPERITONEAL SHUNT  1971    There were no vitals filed for this visit.      Subjective Assessment - 06/27/16 0942    Subjective Pt denied falls or changes since last visit.    Patient is accompained by: --  sister in lobby and then came back to gym    Pertinent History A-fib (mitral valve sx in 2013), hx of astrocytoma (sx at age 67), osteoporosis, arthritis, neurogenic bladder, ataxia, CHF   Patient Stated Goals "I would like to be able to steady myself and reduce the shaking" Pt would also like to know what else she can do at the [facility].   Currently in Pain? No/denies                         [facility] Adult PT Treatment/Exercise - 06/27/16 0944      Exercises   Exercises Knee/Hip     Knee/Hip Exercises: Seated   Other Seated Knee/Hip Exercises While seated on red physioball with min-mod A to maintain balance: pt performed alternating B shoulder flex. x10 reps and ball toss to rehab tech x30 reps. Cues for technique.    Marching Both;Strengthening;10 reps;1 set   Marching Limitations Performed on physioball with cues for upright posture and min-mod A to maintain balance. Pt              Balance Exercises - 06/27/16 1213      Balance Exercises: Standing   Other Standing Exercises Performed with min-mod A to maintain balance: pt  performed reaching with cones across body and in ant. directions x12 reps/direction. Cues for improved upright posture and technique.           PT Education - 06/27/16 1214    Education provided Yes   Education Details Education on performing only current therex at home and to not sit on physioball to ensure safety. PT discussed potential d/c from therapy in 2 weeks, based on goal progress.   Person(s) Educated Patient;Other (comment)  pt's sister-[name]   Methods Explanation   Comprehension Verbalized understanding          PT Short Term Goals - 06/05/16 1030      PT SHORT TERM GOAL #1   Title Pt will be IND in initial HEP to improve strength and flexibility. TARGET DATE FOR ALL STGS: 06/06/16   Status Partially Met     PT SHORT TERM GOAL #2    Title Pt will perform STS txf with B UE support and S to improve functional mobility.    Status Partially Met     PT SHORT TERM GOAL #3   Title Pt will stand at kitchen counter for 5 minutes, at MOD I level, in order to perform ADLs.    Status Achieved           PT Long Term Goals - 05/23/16 1219      PT LONG TERM GOAL #1   Title Pt will be IND in progressed HEP to improve strength, balance, and flexilbility. TARGET DATE FOR ALL LTGS: 07/04/16   Status On-going     PT LONG TERM GOAL #2   Title Pt will perform static standing with 1 UE support, MOD I level, at kitchen sink to perform ADLs without rest break.    Status On-going     PT LONG TERM GOAL #3   Title Pt will perform STS txf at MOD I level to improve functional mobility.   Status On-going     PT LONG TERM GOAL #4   Title Pt will perform ant/post w/c to mat txf at MOD I level to improve functional mobility.    Status On-going     PT LONG TERM GOAL #5   Title Attempt amb. if appropriate and write goal.   Status On-going               Plan - 06/27/16 1215    Clinical Impression Statement Pt demonstrated progress as she was able to tolerate progressed seated therex and standing balance activities. Pt did require min-mod A to maintain balance during today's session 2/2 ataxia and weakness. Pt would continue to benefit from skilled PT to improve safety during functional mobility.    Rehab Potential Good   Clinical Impairments Affecting Rehab Potential co-morbidities and hx of deficts after surgery for brain tumor at age 28   PT Frequency 1x / week   PT Duration 8 weeks   PT Next Visit Plan Review HEP and renew for 2 visits. Check goals.    PT Home Exercise Plan Stretching , strengthening and balance HEP.   Consulted and Agree with Plan of Care Patient      Patient will benefit from skilled therapeutic intervention in order to improve the following deficits and impairments:  Decreased endurance, Impaired tone,  Decreased knowledge of use of DME, Decreased strength, Difficulty walking, Decreased coordination, Impaired flexibility, Postural dysfunction, Decreased balance, Decreased mobility  Visit Diagnosis: Muscle weakness (generalized)  Other lack of coordination  Other abnormalities of gait and mobility  Problem List Patient Active Problem List   Diagnosis Date Noted  . Chronic constipation 03/21/2016  . Routine general medical examination at a health care facility 11/09/2015  . Loss of weight   . Vitamin D deficiency   . Osteoporosis, unspecified   . Hyperlipidemia   . Paroxysmal atrial fibrillation ([place]) 03/03/2012  . Pituitary disorder ([place]) 02/11/2012  . Ataxia 01/29/2012  . Anemia 11/21/2011  . Diastolic CHF, chronic ([place]) 11/13/2011  . Pleural effusion, left 11/11/2011  . S/P mitral valve repair 10/15/2011  . Brain tumor, astrocytoma ([place]) 10/15/2011  . Neuromuscular disorder ([place]) 10/15/2011  . Neurogenic bladder 10/15/2011  . Arthritis 10/15/2011  . Mitral regurgitation due to cusp prolapse 07/08/2011    Bona Hubbard L 06/27/2016, 3:16 PM  [facility] [address] [place], [place], [address] Phone: [phone]   Fax:  [phone]  Name: Michaela [name] MRN: [id number] Date of Birth: [birth year]   Geoffry Paradise, PT,DPT 06/27/16 3:17 PM Phone: [phone] Fax: [phone]

## 2016-06-28 ENCOUNTER — Ambulatory Visit: Payer: Medicare Other

## 2016-07-01 ENCOUNTER — Ambulatory Visit (INDEPENDENT_AMBULATORY_CARE_PROVIDER_SITE_OTHER): Payer: Medicare Other

## 2016-07-01 DIAGNOSIS — M810 Age-related osteoporosis without current pathological fracture: Secondary | ICD-10-CM

## 2016-07-01 DIAGNOSIS — Z23 Encounter for immunization: Secondary | ICD-10-CM | POA: Diagnosis not present

## 2016-07-01 MED ORDER — DENOSUMAB 60 MG/ML ~~LOC~~ SOLN
60.0000 mg | Freq: Once | SUBCUTANEOUS | Status: AC
  Administered 2016-07-01: 60 mg via SUBCUTANEOUS

## 2016-07-01 NOTE — Progress Notes (Signed)
Flu and prolia injection given today.

## 2016-07-02 ENCOUNTER — Ambulatory Visit: Payer: Medicare Other

## 2016-07-04 ENCOUNTER — Ambulatory Visit: Payer: Medicare Other

## 2016-07-04 DIAGNOSIS — M6281 Muscle weakness (generalized): Secondary | ICD-10-CM

## 2016-07-04 DIAGNOSIS — R278 Other lack of coordination: Secondary | ICD-10-CM | POA: Diagnosis not present

## 2016-07-04 DIAGNOSIS — R2689 Other abnormalities of gait and mobility: Secondary | ICD-10-CM

## 2016-07-04 NOTE — Patient Instructions (Signed)
HIP: Hamstrings - Short Sitting    Rest leg on raised surface. Keep knee straight. Lift chest. Hold _30__ seconds. Repeat with other leg. _3__ reps per set, _2-3__ sets per day, _7__ days per week  Copyright  VHI. All rights reserved.    Piriformis Stretch, Supine    Lie supine, one ankle crossed onto opposite knee. Keep foot on mat and gently push the top knee away from body. Hold _30__ seconds. Repeat with other leg. Repeat _3__ times per session. Do _2-3__ sessions per day. Every day. PROGRESSION: Holding bottom leg behind knee, gently pull legs toward chest until stretch is felt in buttock of top leg.  Copyright  VHI. All rights reserved.   Lower Trunk Rotation Stretch    Keeping back flat and feet together, rotate knees to left side. Hold __30__ seconds. Then rotate knees to the right side. Repeat __3__ times per set. Do __1__ sets per session. Do __2-3__ sessions per day. Perform every day.  http://orth.exer.us/123   Copyright  VHI. All rights reserved.   Functional Quadriceps: Sit to Stand    Perform at kitchen, holding bar once you stand up, placing hands on wheelchair armrest in order to stand, then reaching back to armrests to sit back down. Sit on edge of chair, feet flat on floor. Stand upright, extending knees fully. Hold for 10 seconds. Repeat __10__ times per set. Do __2__ sets per session. Do __1__ sessions per day.  http://orth.exer.us/735   Copyright  VHI. All rights reserved.    Weight Shift: Lateral (Limits of Stability)    Hold onto the kitchen bar at your sink. Slowly shift weight (move hips back and forth) to right as far as possible, without taking a step. Return to starting position. Shift to opposite side. Hold each position __2__ seconds. Repeat __10__ times per session. Do __1__ sessions per day.  PROGRESSION: hover one hand above kitchen bar, but keep it close for safety. Copyright  VHI. All rights reserved.     Weight  Shift: Anterior / Posterior (Limits of Stability)    Hold onto bar at kitchen sink. Slowly shift weight backwards (using hips only), while keeping feet flat on the floor. Return to starting position. Shift weight slowly forward (using hips), while keeping feet flat.. Hold each position __2__ seconds. Repeat __10__ times per session. Do __1__ sessions per day.  PROGRESSION: hover one hand above kitchen bar, but keep it close for safety. Copyright  VHI. All rights reserved.  Bridge    Lie back, legs bent. Tuck in stomach and then press hips up towards ceiling. Hold for 2 seconds. Then slowly lower hips back down.  Repeat __10__ times. Perform 3 sets. Do __3-4__ sessions per week.  http://pm.exer.us/55   Copyright  VHI. All rights reserved.   Hip (Front)    Begin sitting tall, both feet flat on floor. Tie red band above knees.Tuck in your stomach, but still be able to breathe comfortably. Keeping upper body straight and still, march right knee and then march the left knee (one knee at a time). Slowly return to starting position. Repeat __10__ times each leg. Do __2__ sets per session. Do __3-4__ sessions per week.  Copyright  VHI. All rights reserved.

## 2016-07-04 NOTE — Therapy (Signed)
[facility] [address] [place], [place], [address] Phone: [phone]   Fax:  [phone]  Physical Therapy Treatment  Patient Details  Name: Michaela [name] MRN: [id number] Date of Birth: 09/16/1955 Referring Provider: Dr. Martinique  Encounter Date: 07/04/2016      PT End of Session - 07/04/16 1221    Visit Number 7   Number of Visits 9   Date for PT Re-Evaluation 07/08/16   Authorization Type Medicare and Medicaid-G-CODE EVERY 10TH VISIT AND PROGRESS NOTE   PT Start Time [id number]  pt late   PT Stop Time 1016   PT Time Calculation (min) 38 min   Equipment Utilized During Treatment --  S for safety   Activity Tolerance Patient tolerated treatment well   Behavior During Therapy WFL for tasks assessed/performed      Past Medical History:  Diagnosis Date  . 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 C. difficile colitis 02/2262   complicated by sepsis/ARDS and prolonged hosp  . Hyperlipidemia   . Mitral regurgitation 10/2011   severe, s/p min invasive repair and annuloplasty  . Neuromuscular disorder ([facility])    numbness in hands and feet  . Osteoarthritis   . Osteoporosis    DEXA @ [facility] 11/2012: -3.5, prior tx Reclast, last in 2011, rec change to Prolia  . Overactive bladder   . Pleural effusion, left 11/11/2011  . Vitamin D deficiency     Past Surgical History:  Procedure Laterality Date  . CARDIAC CATHETERIZATION     Jan 2013  . KNEE ARTHROSCOPY     right  . MITRAL VALVE REPAIR  10/15/2011   Procedure: MINIMALLY INVASIVE MITRAL VALVE REPAIR (MVR);  Surgeon: Rexene Alberts, MD;  Location: [facility];  Service: Open Heart Surgery;  Laterality: Right;  . TRANSESOPHAGEAL ECHOCARDIOGRAM  7/12  . US ECHOCARDIOGRAPHY  6/12  . VENTRICULOPERITONEAL SHUNT  1971    There were no vitals filed for this visit.      Subjective Assessment - 07/04/16 0941    Subjective Pt denied falls since  last visit. Pt reported she took a Xanax last night and this evening to reduce "shaking".    Patient is accompained by: Family member  [name]-sister is in the lobby   Pertinent History A-fib (mitral valve sx in 2013), hx of astrocytoma (sx at age 48), osteoporosis, arthritis, neurogenic bladder, ataxia, CHF   Patient Stated Goals "I would like to be able to steady myself and reduce the shaking" Pt would also like to know what else she can do at the [facility].   Currently in Pain? No/denies         Neuro re-ed: Pt performed balance HEP with cues and demo for technique. Please see pt instructions for details. S for safety.  Therex: Pt performed strengthening and stretching HEP with cues and demo for technique. Please see pt instructions for details. S for safety.                         PT Education - 07/04/16 1011    Education provided Yes   Education Details PT progressed HEP as tolerated. PT reiterated the importance of performing HEP as prescribed in handout. PT also educated pt on the importance          PT Short Term Goals - 06/05/16 1030      PT SHORT TERM GOAL #1   Title Pt  will be IND in initial HEP to improve strength and flexibility. TARGET DATE FOR ALL STGS: 06/06/16   Status Partially Met     PT SHORT TERM GOAL #2   Title Pt will perform STS txf with B UE support and S to improve functional mobility.    Status Partially Met     PT SHORT TERM GOAL #3   Title Pt will stand at kitchen counter for 5 minutes, at MOD I level, in order to perform ADLs.    Status Achieved           PT Long Term Goals - 05/23/16 1219      PT LONG TERM GOAL #1   Title Pt will be IND in progressed HEP to improve strength, balance, and flexilbility. TARGET DATE FOR ALL LTGS: 07/04/16   Status On-going     PT LONG TERM GOAL #2   Title Pt will perform static standing with 1 UE support, MOD I level, at kitchen sink to perform ADLs without rest break.    Status On-going      PT LONG TERM GOAL #3   Title Pt will perform STS txf at MOD I level to improve functional mobility.   Status On-going     PT LONG TERM GOAL #4   Title Pt will perform ant/post w/c to mat txf at MOD I level to improve functional mobility.    Status On-going     PT LONG TERM GOAL #5   Title Attempt amb. if appropriate and write goal.   Status On-going               Plan - 07/04/16 1222    Clinical Impression Statement Pt demonstrated progress, as PT was able to progress HEP as appropriate. Pt continues to require cues for technique and incr. time for processing. PT will renew for 1 visit and discharge pt next session based on pt's progress, as pt missed one week of therapy eariler in POC.   Rehab Potential Good   Clinical Impairments Affecting Rehab Potential co-morbidities and hx of deficts after surgery for brain tumor at age 38   PT Frequency 1x / week   PT Duration 8 weeks   PT Treatment/Interventions ADLs/Self Care Home Management;Biofeedback;Electrical Stimulation;Neuromuscular re-education;Balance training;Therapeutic exercise;Passive range of motion;Therapeutic activities;Functional mobility training;Gait training;DME Instruction;Patient/family education;Orthotic Fit/Training;Wheelchair mobility training;Manual techniques   PT Next Visit Plan Check goals and d/c.   PT Home Exercise Plan Stretching , strengthening and balance HEP.   Consulted and Agree with Plan of Care Patient      Patient will benefit from skilled therapeutic intervention in order to improve the following deficits and impairments:  Decreased endurance, Impaired tone, Decreased knowledge of use of DME, Decreased strength, Difficulty walking, Decreased coordination, Impaired flexibility, Postural dysfunction, Decreased balance, Decreased mobility  Visit Diagnosis: Muscle weakness (generalized)  Other lack of coordination  Other abnormalities of gait and mobility     Problem List Patient Active  Problem List   Diagnosis Date Noted  . Chronic constipation 03/21/2016  . Routine general medical examination at a health care facility 11/09/2015  . Loss of weight   . Vitamin D deficiency   . Osteoporosis, unspecified   . Hyperlipidemia   . Paroxysmal atrial fibrillation ([facility]) 03/03/2012  . Pituitary disorder ([facility]) 02/11/2012  . Ataxia 01/29/2012  . Anemia 11/21/2011  . Diastolic CHF, chronic ([facility]) 11/13/2011  . Pleural effusion, left 11/11/2011  . S/P mitral valve repair 10/15/2011  . Brain tumor, astrocytoma ([place])  10/15/2011  . Neuromuscular disorder ([facility]) 10/15/2011  . Neurogenic bladder 10/15/2011  . Arthritis 10/15/2011  . Mitral regurgitation due to cusp prolapse 07/08/2011    Miller,Jennifer L 07/04/2016, 12:24 PM  [facility] [address] [place], [place], [address] Phone: [phone]   Fax:  [phone]  Name: TEGAN BRITAIN MRN: [id number] Date of Birth: 1955/09/18   Geoffry Paradise, PT,DPT 07/04/16 12:25 PM Phone: [phone] Fax: [phone]

## 2016-07-08 ENCOUNTER — Encounter: Payer: Self-pay | Admitting: Family Medicine

## 2016-07-08 ENCOUNTER — Ambulatory Visit (INDEPENDENT_AMBULATORY_CARE_PROVIDER_SITE_OTHER): Payer: Medicare Other | Admitting: Family Medicine

## 2016-07-08 VITALS — BP 120/78 | HR 88 | Resp 12 | Ht <= 58 in

## 2016-07-08 DIAGNOSIS — K5909 Other constipation: Secondary | ICD-10-CM | POA: Diagnosis not present

## 2016-07-08 DIAGNOSIS — R270 Ataxia, unspecified: Secondary | ICD-10-CM | POA: Diagnosis not present

## 2016-07-08 DIAGNOSIS — N319 Neuromuscular dysfunction of bladder, unspecified: Secondary | ICD-10-CM

## 2016-07-08 DIAGNOSIS — F419 Anxiety disorder, unspecified: Secondary | ICD-10-CM | POA: Diagnosis not present

## 2016-07-08 MED ORDER — SERTRALINE HCL 25 MG PO TABS: 25.0000 mg | ORAL_TABLET | Freq: Every day | ORAL | 1 refills | Status: DC

## 2016-07-08 NOTE — Progress Notes (Signed)
HPI:  ACUTE VISIT:  Chief Complaint  Patient presents with  . Shaking    Michaela [name] is a 60 y.o. female, who is here today with her sister complaining of "shaking", "ataxic." According to pt, she has had this for many years but she feels like it is getting worse. She denies any headache, seizure like activity, or LOC.   She followed with neurologists in the past but apparently he left the practice. Hx of cerebral astrocytoma at age 3 s/p resection with residual ataxia and spasticity. Post treatment complications including neurogenic bladder and cerebellar dysfunction.   -Sister thinks this is mainly related to anxiety. She has noted symptoms are worse when she is frustrated.  Sister thinks she needs a "antidepresant." She would like to have the "little happy pill", apparently her sister gave her some of her Xanax and it seems to help with anxiety. She denies any Hx of depression or anxiety, she denies crying spells or lost of interest, or suicidal thoughts. She is not sleeping well. Sister describes her mood as "irritable/angry" when she gets frustrated. She has not been on medications in the past.   -She already discontinue Metoprolol Succinate and has  Not had any lightheadedness. BP check 2 times weekly bu CNA and usually 120's/60's, HR 80-90's. She would like to decrease BP check frequency to once weekly. She has an aid 5 times per week.  -Constipation has improved greatly with Linzess 145 mcg daily. She denies any sie effects including diarrhea or abdominal cramps.  -Hx of neurogenic bladder, still very frustrated with episodes of incontinence. She denies dysuria or hematuria. She wears pads or pull ups. Frequently she tries to rush to the bathroom when she feels the urge to void.  She has followed with urologists in the past and refused new referral last OV. Currently she is on Trospium Chloride 60 mg daily but does not feels like it is helping  much.    Review of Systems  Constitutional: Negative for activity change, appetite change, fatigue, fever and unexpected weight change.  HENT: Negative for mouth sores, nosebleeds and trouble swallowing.   Respiratory: Negative for cough, shortness of breath and wheezing.   Cardiovascular: Negative for chest pain, palpitations and leg swelling.  Gastrointestinal: Negative for abdominal pain, constipation, nausea and vomiting.  Genitourinary: Negative for decreased urine volume, dysuria, hematuria and pelvic pain.  Neurological: Positive for tremors. Negative for seizures, syncope, weakness, light-headedness and headaches.  Psychiatric/Behavioral: Positive for sleep disturbance. Negative for confusion, hallucinations, self-injury and suicidal ideas. The patient is nervous/anxious.       Current Outpatient Prescriptions on File Prior to Visit  Medication Sig Dispense Refill  . aspirin EC 81 MG tablet Take 1 tablet (81 mg total) by mouth daily. 90 tablet 3  . atorvastatin (LIPITOR) 10 MG tablet TAKE 1 TABLET (10 MG TOTAL) BY MOUTH DAILY. 90 tablet 3  . denosumab (PROLIA) 60 MG/ML SOLN injection Inject 60 mg into the skin every 6 (six) months. Administer in upper arm, thigh, or abdomen 1 Syringe 0  . LINZESS 145 MCG CAPS capsule TAKE ONE CAPSULE BY MOUTH DAILY BEFORE BREAKFAST 30 capsule 3  . polyethylene glycol powder (GLYCOLAX/MIRALAX) powder Take 17 g by mouth daily. 3350 g 1  . Probiotic Product ([place]) CAPS Take by mouth daily.    . Trospium Chloride 60 MG CP24 TAKE 1 CAPSULE (60 MG TOTAL) BY MOUTH DAILY. 90 capsule 2  . vitamin B-12 (CYANOCOBALAMIN) 1000 MCG  tablet Take 1,000 mcg by mouth daily.    . [DISCONTINUED] imipramine (TOFRANIL) 25 MG tablet Take 25 mg by mouth at bedtime.     . [DISCONTINUED] metoprolol tartrate (LOPRESSOR) 12.5 mg TABS Take 12.5 mg by mouth 2 (two) times daily.    . [DISCONTINUED] polysaccharide iron (NIFEREX) 150 MG CAPS capsule Take 1  capsule (150 mg total) by mouth daily. 30 each 0  . [DISCONTINUED] pravastatin (PRAVACHOL) 80 MG tablet Take 80 mg by mouth daily.     No current facility-administered medications on file prior to visit.      Past Medical History:  Diagnosis Date  . Astrocytoma brain tumor ([facility]) 1971   astrocytoma treated with radiation  . C. difficile colitis [id number]   complicated by sepsis/ARDS and prolonged hosp  . Hyperlipidemia   . Mitral regurgitation 10/2011   severe, s/p min invasive repair and annuloplasty  . Neuromuscular disorder ([facility])    numbness in hands and feet  . Osteoarthritis   . Osteoporosis    DEXA @ [facility] 11/2012: -3.5, prior tx Reclast, last in 2011, rec change to Prolia  . Overactive bladder   . Pleural effusion, left 11/11/2011  . Vitamin D deficiency    Allergies  Allergen Reactions  . Compazine Other (See Comments)    "eyes get buggy"    Social History   Social History  . Marital status: Single    Spouse name: N/A  . Number of children: N/A  . Years of education: N/A   Social History Main Topics  . Smoking status: Former Smoker    Quit date: 09/09/1990  . Smokeless tobacco: Never Used  . Alcohol use 0.0 oz/week  . Drug use: No  . Sexual activity: Not Asked   Other Topics Concern  . None   Social History Narrative  . None    Vitals:   07/08/16 1333  BP: 120/78  Pulse: 88  Resp: 12   There is no height or weight on file to calculate BMI.    Physical Exam  Nursing note and vitals reviewed. Constitutional: She is oriented to person, place, and time. She appears well-developed. No distress.  Underwt  HENT:  Head: Atraumatic.  Mouth/Throat: Oropharynx is clear and moist and mucous membranes are normal.  Eyes: Conjunctivae are normal.  Cardiovascular: Normal rate and regular rhythm.   No murmur heard. Respiratory: Effort normal and breath sounds normal. No respiratory distress.  GI: Soft. There is no tenderness.  Musculoskeletal: She exhibits no  edema or tenderness.  Neurological: She is alert and oriented to person, place, and time. She displays atrophy and tremor. She exhibits abnormal muscle tone.  Spasticity and ataxia, she is in a wheel chair  Skin: Skin is warm. No erythema.  Psychiatric: Her mood appears anxious.  Well groomed, good eye contact.      ASSESSMENT AND PLAN:     Annay was seen today for shaking.  Diagnoses and all orders for this visit:    Ataxia  And spasticity. Baclofen may help, she prefers to hold on it. Residual effect of radiation treatment after astrocytoma removal, per pt report. I am not sure about treatment options other than PT. Continue PT. Neurology referral placed.  -     Ambulatory referral to Neurology  Neurogenic bladder  She is not interested for now in urologists evaluation, she is not willing to undergo procedures or invasive treatments. I recommended scheduling urination, q 2-3 hours, taking time trying to empty bladder. Fall precautions, usually  at risk when she tries to rush to bathroom.  Anxiety disorder, unspecified type  We discussed some side effects of medications, including benzodiazepines, this meds are not supposed to be shared. Sertraline 25 mg daily recommended. F/U in 3 weeks. Instructed about warning signs.   -     sertraline (ZOLOFT) 25 MG tablet; Take 1 tablet (25 mg total) by mouth daily.   Chronic constipation  Improved. No changes in Linzess. Adequate fiber and fluid intake.       Return in about 3 weeks (around 07/29/2016) for anxiety.     -Ms.JUDIT REBER was advised to return or notify a doctor immediately if symptoms worsen or new concerns arise, she voices understanding.       Betty G. Martinique, MD  [facility], [place]. [place] office.

## 2016-07-08 NOTE — Patient Instructions (Signed)
A few things to remember from today's visit:   Ataxia - Plan: Ambulatory referral to Neurology  Neurogenic bladder  Anxiety disorder, unspecified type - Plan: sertraline (ZOLOFT) 25 MG tablet  Sertraline start with 1/2 tab x 3-4 days then one tab.  Fall precautions.  If interested we can arrange appointment with urologists. Try schedule voiding.   Please be sure medication list is accurate. If a new problem present, please set up appointment sooner than planned today.

## 2016-07-08 NOTE — Progress Notes (Signed)
Pre visit review using our clinic review tool, if applicable. No additional management support is needed unless otherwise documented below in the visit note. 

## 2016-07-09 ENCOUNTER — Ambulatory Visit: Payer: Medicare Other

## 2016-07-11 ENCOUNTER — Ambulatory Visit: Payer: Medicare Other | Attending: Family Medicine

## 2016-07-11 DIAGNOSIS — M6281 Muscle weakness (generalized): Secondary | ICD-10-CM | POA: Insufficient documentation

## 2016-07-11 DIAGNOSIS — R2689 Other abnormalities of gait and mobility: Secondary | ICD-10-CM | POA: Diagnosis not present

## 2016-07-11 DIAGNOSIS — R278 Other lack of coordination: Secondary | ICD-10-CM

## 2016-07-11 NOTE — Therapy (Signed)
[facility] [address] [place], [place], [address] Phone: [phone]   Fax:  [phone]  Physical Therapy Treatment  Patient Details  Name: Michaela [name] MRN: [id number] Date of Birth: 08-26-1956 Referring Provider: Dr. Martinique  Encounter Date: 07/11/2016      PT End of Session - 07/11/16 1008    Visit Number 8   Number of Visits 9   Date for PT Re-Evaluation 07/08/16   Authorization Type Medicare and Medicaid-G-CODE EVERY 10TH VISIT AND PROGRESS NOTE   PT Start Time 0939   PT Stop Time 1003   PT Time Calculation (min) 24 min   Equipment Utilized During Treatment --  S prn   Activity Tolerance Patient tolerated treatment well   Behavior During Therapy [facility] for tasks assessed/performed      Past Medical History:  Diagnosis Date  . 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 C. difficile colitis 05/7988   complicated by sepsis/ARDS and prolonged hosp  . Hyperlipidemia   . Mitral regurgitation 10/2011   severe, s/p min invasive repair and annuloplasty  . Neuromuscular disorder (HCC)    numbness in hands and feet  . Osteoarthritis   . Osteoporosis    DEXA @ [facility] 11/2012: -3.5, prior tx Reclast, last in 2011, rec change to Prolia  . Overactive bladder   . Pleural effusion, left 11/11/2011  . Vitamin D deficiency     Past Surgical History:  Procedure Laterality Date  . CARDIAC CATHETERIZATION     Jan 2013  . KNEE ARTHROSCOPY     right  . MITRAL VALVE REPAIR  10/15/2011   Procedure: MINIMALLY INVASIVE MITRAL VALVE REPAIR (MVR);  Surgeon: Rexene Alberts, MD;  Location: [place];  Service: Open Heart Surgery;  Laterality: Right;  . TRANSESOPHAGEAL ECHOCARDIOGRAM  7/12  . US ECHOCARDIOGRAPHY  6/12  . VENTRICULOPERITONEAL SHUNT  1971    There were no vitals filed for this visit.      Subjective Assessment - 07/11/16 0941    Subjective Pt reported she fell back against the  wall/cedar chest and hurt her back, but she caught herself and denied hitting head.    Pertinent History A-fib (mitral valve sx in 2013), hx of astrocytoma (sx at age 28), osteoporosis, arthritis, neurogenic bladder, ataxia, CHF   Patient Stated Goals "I would like to be able to steady myself and reduce the shaking" Pt would also like to know what else she can do at the [facility].   Currently in Pain? Yes   Pain Score 5    Pain Location Back   Pain Orientation Lower   Pain Descriptors / Indicators Aching  bruised   Pain Type Acute pain   Pain Onset Yesterday   Pain Frequency Constant   Aggravating Factors  nothing   Pain Relieving Factors nothing                          [facility] Adult PT Treatment/Exercise - 07/11/16 1004      Transfers   Transfers Sit to Stand;Stand to Sit;Squat Pivot Transfers;Anterior-Posterior Transfer   Sit to Stand 6: Modified independent (Device/Increase time)   Sit to Stand Details (indicate cue type and reason) Pt demonstrated safe and proper technique. Pt able to stand with B UE support on sink and with one UE support on sink and one UE support on w/c. x5 reps.   Stand to Sit 6: Modified  independent (Device/Increase time)   Anterior-Posterior Transfer 5: Supervision;6: Modified independent (Device/Increase time)   Anterior-Posterior Transfer Details (indicate cue type and reason) S during w/c to mat and MOD I level during mat to w/c, as pt experienced LOB during w/c to mat, due to ataxia but self corrected with UE support.             Balance Exercises - 07/11/16 1007      Balance Exercises: Standing   Other Standing Exercises Performed at sink with 1-2 UE support, pt performed ant/post/lat. wt. shifting x10reps and reaching overhead to open/close cabinets with L UE x3 reps. Performed at MOD I level.            PT Education - 07/11/16 1008    Education provided Yes   Education Details PT reviewed HEP and pt verbalized understanding and  agreement. PT educated pt on using ice for LBP for 15-20 minutes several times a day vs. heating pad to decr. inflammation and to inform MD if pain persists.    Person(s) Educated Patient;Caregiver(s)   Methods Explanation   Comprehension Verbalized understanding          PT Short Term Goals - 06/05/16 1030      PT SHORT TERM GOAL #1   Title Pt will be IND in initial HEP to improve strength and flexibility. TARGET DATE FOR ALL STGS: 06/06/16   Status Partially Met     PT SHORT TERM GOAL #2   Title Pt will perform STS txf with B UE support and S to improve functional mobility.    Status Partially Met     PT SHORT TERM GOAL #3   Title Pt will stand at kitchen counter for 5 minutes, at MOD I level, in order to perform ADLs.    Status Achieved           PT Long Term Goals - 07/11/16 1010      PT LONG TERM GOAL #1   Title Pt will be IND in progressed HEP to improve strength, balance, and flexilbility. TARGET DATE FOR ALL LTGS: 07/04/16   Status Partially Met     PT LONG TERM GOAL #2   Title Pt will perform static standing with 1 UE support, MOD I level, at kitchen sink to perform ADLs without rest break.    Status Achieved     PT LONG TERM GOAL #3   Title Pt will perform STS txf at MOD I level to improve functional mobility.   Status Achieved     PT LONG TERM GOAL #4   Title Pt will perform ant/post w/c to mat txf at MOD I level to improve functional mobility.    Status Partially Met     PT LONG TERM GOAL #5   Title Attempt amb. if appropriate and write goal.   Status Deferred               Plan - 07/11/16 1009    Clinical Impression Statement Pt met LTGs 2 and 3, and partially met LTGs 1 and 4. Pt pleased with incr. strength and functional, please see pt instructions for details. Sending MD renewal for today's visit along with d/c.    Rehab Potential Good   Clinical Impairments Affecting Rehab Potential co-morbidities and hx of deficts after surgery for brain  tumor at age 49   PT Frequency 1x / week   PT Duration 8 weeks   PT Treatment/Interventions ADLs/Self Care Home Management;Biofeedback;Electrical Stimulation;Neuromuscular re-education;Balance training;Therapeutic exercise;Passive  range of motion;Therapeutic activities;Functional mobility training;Gait training;DME Instruction;Patient/family education;Orthotic Fit/Training;Wheelchair mobility training;Manual techniques   PT Home Exercise Plan Stretching , strengthening and balance HEP.   Consulted and Agree with Plan of Care Patient      Patient will benefit from skilled therapeutic intervention in order to improve the following deficits and impairments:  Decreased endurance, Impaired tone, Decreased knowledge of use of DME, Decreased strength, Difficulty walking, Decreased coordination, Impaired flexibility, Postural dysfunction, Decreased balance, Decreased mobility  Visit Diagnosis: Muscle weakness (generalized)  Other lack of coordination  Other abnormalities of gait and mobility       G-Codes - 2016-08-09 1010    Functional Assessment Tool Used MOD I Level during STS and ant/post and S to MOD I level ant/post txfs, MOD I STS and standing balance with 1 UE support.   Functional Limitation Changing and maintaining body position   Changing and Maintaining Body Position Goal Status [phone]) At least 20 percent but less than 40 percent impaired, limited or restricted   Changing and Maintaining Body Position Discharge Status ([id number]) At least 20 percent but less than 40 percent impaired, limited or restricted      Problem List Patient Active Problem List   Diagnosis Date Noted  . Anxiety disorder 07/08/2016  . Chronic constipation 03/21/2016  . Routine general medical examination at a health care facility 11/09/2015  . Loss of weight   . Vitamin D deficiency   . Osteoporosis, unspecified   . Hyperlipidemia   . Paroxysmal atrial fibrillation ([place]) 03/03/2012  . Pituitary disorder  ([place]) 02/11/2012  . Ataxia 01/29/2012  . Anemia 11/21/2011  . Diastolic CHF, chronic ([place]) 11/13/2011  . Pleural effusion, left 11/11/2011  . S/P mitral valve repair 10/15/2011  . Brain tumor, astrocytoma ([place]) 10/15/2011  . Neuromuscular disorder ([place]) 10/15/2011  . Neurogenic bladder 10/15/2011  . Arthritis 10/15/2011  . Mitral regurgitation due to cusp prolapse 07/08/2011    Karim Aiello L 2016/08/09, 10:11 AM  [facility] [address] [place], [place], [address] Phone: [phone]   Fax:  [phone]  Name: Michaela [name] MRN: [id number] Date of Birth: [birth year]  PHYSICAL THERAPY DISCHARGE SUMMARY  Visits from Start of Care: 8  Current functional level related to goals / functional outcomes:     PT Long Term Goals - 2016/08/09 1010      PT LONG TERM GOAL #1   Title Pt will be IND in progressed HEP to improve strength, balance, and flexilbility. TARGET DATE FOR ALL LTGS: 07/04/16   Status Partially Met     PT LONG TERM GOAL #2   Title Pt will perform static standing with 1 UE support, MOD I level, at kitchen sink to perform ADLs without rest break.    Status Achieved     PT LONG TERM GOAL #3   Title Pt will perform STS txf at MOD I level to improve functional mobility.   Status Achieved     PT LONG TERM GOAL #4   Title Pt will perform ant/post w/c to mat txf at MOD I level to improve functional mobility.    Status Partially Met     PT LONG TERM GOAL #5   Title Attempt amb. if appropriate and write goal.   Status Deferred        Remaining deficits: Ataxia   Education / Equipment: HEP  Plan: Patient agrees to discharge.  Patient goals were met. Patient is being discharged due to meeting the stated rehab  goals.  ?????        Geoffry Paradise, PT,DPT 07/11/16 10:12 AM Phone: [phone] Fax: [phone]

## 2016-07-29 ENCOUNTER — Telehealth: Payer: Self-pay | Admitting: Family Medicine

## 2016-07-29 NOTE — Telephone Encounter (Signed)
It could be viral and self limited. OTC Rhinocort may help with nasal congestion. Plain Mucinex may help with cough. Adequate hydration.  Thanks, BJ

## 2016-07-29 NOTE — Telephone Encounter (Signed)
See below

## 2016-07-29 NOTE — Telephone Encounter (Signed)
Pts mother is calling to see if Dr. Martinique will call her something for her congestion and mother has given her Alka [place] Cold/Mucinex/and Ziacam without fever and coughing up mucus.   Pharm:  [facility] on [employer].

## 2016-07-29 NOTE — Telephone Encounter (Signed)
Called and spoke with patient's mother. Relayed message below and she verbalized understanding. She said she would pick up the Rhinocort & Mucinex tomorrow.

## 2016-08-05 ENCOUNTER — Ambulatory Visit (INDEPENDENT_AMBULATORY_CARE_PROVIDER_SITE_OTHER): Payer: Medicare Other | Admitting: Family Medicine

## 2016-08-05 ENCOUNTER — Encounter: Payer: Self-pay | Admitting: Family Medicine

## 2016-08-05 VITALS — BP 90/45 | HR 83 | Temp 97.9°F | Resp 12 | Ht <= 58 in | Wt 70.4 lb

## 2016-08-05 DIAGNOSIS — J069 Acute upper respiratory infection, unspecified: Secondary | ICD-10-CM | POA: Diagnosis not present

## 2016-08-05 DIAGNOSIS — G709 Myoneural disorder, unspecified: Secondary | ICD-10-CM

## 2016-08-05 DIAGNOSIS — F419 Anxiety disorder, unspecified: Secondary | ICD-10-CM | POA: Diagnosis not present

## 2016-08-05 MED ORDER — ESCITALOPRAM OXALATE 5 MG PO TABS: 5.0000 mg | ORAL_TABLET | Freq: Every day | ORAL | 2 refills | Status: DC

## 2016-08-05 NOTE — Patient Instructions (Addendum)
   A few things to remember from today's visit:   Upper respiratory tract infection, unspecified type  Anxiety disorder, unspecified type - Plan: escitalopram (LEXAPRO) 5 MG tablet  Let's try Lexapro low dose.  Please arrange neurology appointment.  viral infections are self-limited and we treat each symptom depending of severity.   Honey helps with cough. Steam inhalations helps with runny nose, nasal congestion, and may prevent sinus infections. Cough and nasal congestion could last a few days and sometimes weeks.          [facility]  Neurologist  Address: [place], [place], [place] [address]  [phone]

## 2016-08-05 NOTE — Progress Notes (Signed)
Pre visit review using our clinic review tool, if applicable. No additional management support is needed unless otherwise documented below in the visit note. 

## 2016-08-05 NOTE — Progress Notes (Signed)
HPI:   [name] is a 60 y.o. female, who is here today with her sister to follow on her last office visit.   I last saw her 07/08/2016 for, when she was complaining of worsening anxiety and sister thought she might benefit from pharmacologic treatment. Initially Xanax was requested but given her medical Hx, I did not think it was appropriate.  She agreed to try sertraline 25 mg daily, she is telling me that she discontinued after a few days because it was causing tiredness and she did not feel like it was helping.   Initially she states that she was taking medication in the morning but later during the visit she tells me that she also tried medication at bedtime and next day she was still feeling fatigued. She denies any other side effects. She denies depressed mood or suicidal thoughts.   She lives alone and mother and sister live close by and check on her daily.   -Hx of cerebral astrocytoma Dx at age 81 and s/p resection with residual ataxia and spasticity. Last office visit she was also requesting referral to neurologist because of worsening ataxia/spasticity.  It seems like she missed call from neurologists' office, she has not called them back. She wonders if there is any medication she can try to help with spasticity. She completed PT treatment and she is now doing some exercises at home, sister states that PT has helped greatly, she is doing PT exercises at home.  Problem otherwise stable.   -She also has hx of neurogenic bladder, still not interested in urology consultation. She tells me that since her last OV symptoms are not bad. She denies dysuria or gross hematuria.    Concerns today: cough.   For about 7-8 days she has had non-productive cough, occasionally she has some sputum, she denies hemoptysis. She is reporting subjective fever when she first started with acute illness.  Currently she has not had  fever, chill, or myalgias. Some nasal  congestion, rhinorrhea, and post nasal drainage.  Denies chest pain, dyspnea, or wheezing.   No Hx of recent travel. Sick contact: Sister was sick. No known insect bite. + Hx of mild seasonal allergies.  OTC medications for this problem: OTC cold meds Symptoms otherwise stable.    Review of Systems  Constitutional: Positive for fatigue. Negative for activity change, appetite change and fever.  HENT: Positive for congestion and postnasal drip. Negative for ear pain, mouth sores, sinus pain, sore throat, trouble swallowing and voice change.   Eyes: Negative for discharge and redness.  Respiratory: Positive for cough. Negative for shortness of breath and wheezing.   Gastrointestinal: Negative for abdominal pain, nausea and vomiting.       No changes in bowel habits.  Musculoskeletal: Positive for gait problem. Negative for myalgias.  Skin: Negative for rash.  Allergic/Immunologic: Positive for environmental allergies.  Neurological: Positive for tremors. Negative for syncope, weakness and headaches.  Psychiatric/Behavioral: Negative for confusion and suicidal ideas. The patient is nervous/anxious.       Current Outpatient Prescriptions on File Prior to Visit  Medication Sig Dispense Refill  . aspirin EC 81 MG tablet Take 1 tablet (81 mg total) by mouth daily. 90 tablet 3  . atorvastatin (LIPITOR) 10 MG tablet TAKE 1 TABLET (10 MG TOTAL) BY MOUTH DAILY. 90 tablet 3  . denosumab (PROLIA) 60 MG/ML SOLN injection Inject 60 mg into the skin every 6 (six) months. Administer in upper arm, thigh, or  abdomen 1 Syringe 0  . LINZESS 145 MCG CAPS capsule TAKE ONE CAPSULE BY MOUTH DAILY BEFORE BREAKFAST 30 capsule 3  . polyethylene glycol powder (GLYCOLAX/MIRALAX) powder Take 17 g by mouth daily. 3350 g 1  . Probiotic Product ([place]) CAPS Take by mouth daily.    . Trospium Chloride 60 MG CP24 TAKE 1 CAPSULE (60 MG TOTAL) BY MOUTH DAILY. 90 capsule 2  . vitamin B-12  (CYANOCOBALAMIN) 1000 MCG tablet Take 1,000 mcg by mouth daily.       Past Medical History:  Diagnosis Date  . 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 C. difficile colitis [id number]   complicated by sepsis/ARDS and prolonged hosp  . Hyperlipidemia   . Mitral regurgitation 10/2011   severe, s/p min invasive repair and annuloplasty  . Neuromuscular disorder (HCC)    numbness in hands and feet  . Osteoarthritis   . Osteoporosis    DEXA @ [facility] 11/2012: -3.5, prior tx Reclast, last in 2011, rec change to Prolia  . Overactive bladder   . Pleural effusion, left 11/11/2011  . Vitamin D deficiency    Allergies  Allergen Reactions  . Compazine Other (See Comments)    "eyes get buggy"    Social History   Social History  . Marital status: Single    Spouse name: N/A  . Number of children: N/A  . Years of education: N/A   Social History Main Topics  . Smoking status: Former Smoker    Quit date: 09/09/1990  . Smokeless tobacco: Never Used  . Alcohol use 0.0 oz/week  . Drug use: No  . Sexual activity: Not Asked   Other Topics Concern  . None   Social History Narrative  . None    Vitals:   08/05/16 1048  BP: (!) 90/45  Pulse: 83  Resp: 12  Temp: 97.9 F (36.6 C)    O2 sat at RA 95%  Body mass index is 14.71 kg/m.    Physical Exam  Nursing note and vitals reviewed. Constitutional: She is oriented to person, place, and time. She appears well-developed. She does not appear ill. No distress.  Underwt  HENT:  Head: Atraumatic.  Mouth/Throat: Oropharynx is clear and moist and mucous membranes are normal.  Post nasal drainage.  Eyes: Conjunctivae are normal.  Cardiovascular: Normal rate and regular rhythm.   No murmur heard. Respiratory: Effort normal and breath sounds normal. No respiratory distress. She has no wheezes. She has no rales.  Musculoskeletal: She exhibits no edema.  Lymphadenopathy:       Head (right side): No  submandibular adenopathy present.       Head (left side): No submandibular adenopathy present.    She has no cervical adenopathy.  Neurological: She is alert and oriented to person, place, and time. She displays atrophy and tremor. She exhibits abnormal muscle tone. Coordination and gait abnormal.  She is in a wheel chair  Skin: Skin is warm. No erythema.  Psychiatric: Her mood appears anxious. She expresses no suicidal ideation.  Well groomed, good eye contact.      ASSESSMENT AND PLAN:     Diagnoses and all orders for this visit:    Upper respiratory tract infection, unspecified type  Symptoms suggests a viral etiology, most symptoms resolved and now with residual cough. I do not think abx is needed at this time nor imaging. Instructed to monitor for signs of complications, including new onset of fever among some,  clearly instructed about warning signs. Plain Mucinex and Honey may help. I also explained that cough and nasal congestion can last a few days and sometimes weeks. F/U as needed.   Anxiety disorder, unspecified type  She would like to try another medication, Lexapro 5 mg recommended. We discussed some side effects, explained that medication may take up to 6-8 weeks to notice max benefit. Instructed about warning signs. Follow-up in 2-3 months.  -     escitalopram (LEXAPRO) 5 MG tablet; Take 1 tablet (5 mg total) by mouth daily.  Neuromuscular disorder ([place])  I provided contact information about neurologists office, her sister is going to call and arrange appointment. We discussed some treatment options for ataxia/spasticity, explained that Baclofen is one of those but due to potential side effects I will prefer to hold on pharmacologic treatment. Continue PT exercises at home. Fall precautions. Continue following with neurologists.        -Ms. TAHYA RAAK was advised to return sooner than planned today if new concerns arise, she and her sister  voice understanding..       Betty G. Martinique, MD  [facility]. [place] office.

## 2016-08-21 ENCOUNTER — Encounter: Payer: Self-pay | Admitting: Internal Medicine

## 2016-08-21 ENCOUNTER — Ambulatory Visit (INDEPENDENT_AMBULATORY_CARE_PROVIDER_SITE_OTHER): Payer: Medicare Other | Admitting: Internal Medicine

## 2016-08-21 VITALS — BP 90/50 | HR 80 | Ht <= 58 in | Wt 79.0 lb

## 2016-08-21 DIAGNOSIS — K5900 Constipation, unspecified: Secondary | ICD-10-CM | POA: Diagnosis not present

## 2016-08-21 DIAGNOSIS — Z1211 Encounter for screening for malignant neoplasm of colon: Secondary | ICD-10-CM

## 2016-08-21 NOTE — Progress Notes (Signed)
HISTORY OF PRESENT ILLNESS:  Michaela [name] is a 59 y.o. female with remote history of astrocytoma brain status post surgery, chronic ataxia with central tremor, and chronic constipation. The patient is sent today regarding chronic constipation and colon cancer screening. She is accompanied by her sister. She reports long-standing problems with constipation. I performed complete colonoscopy in November 2005 for complaints of constipation and abdominal bloating discomfort. Examination was entirely normal. Treatment with MiraLAX recommended. She tells that she was placed on linzess 145 g daily proximally 4 months ago. She states this helps her constipation significantly. Over the past few weeks may be less effective. Has 3 bowel movements per week. No significant abdominal pain. No bleeding. No family history of colon cancer.  REVIEW OF SYSTEMS:  All non-GI ROS negative except for arthritis, hearing problems, fatigue, tremor  Past Medical History:  Diagnosis Date  . 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 C. difficile colitis [id number]   complicated by sepsis/ARDS and prolonged hosp  . Depression   . Hyperlipidemia   . Mitral regurgitation 10/2011   severe, s/p min invasive repair and annuloplasty  . Mitral valve prolapse   . Neuromuscular disorder (HCC)    numbness in hands and feet  . Osteoarthritis   . Osteoporosis    DEXA @ [facility] 11/2012: -3.5, prior tx Reclast, last in 2011, rec change to Prolia  . Overactive bladder   . Pleural effusion, left 11/11/2011  . Vitamin D deficiency     Past Surgical History:  Procedure Laterality Date  . CARDIAC CATHETERIZATION     Jan 2013  . KNEE ARTHROSCOPY Right   . MITRAL VALVE REPAIR  10/15/2011   Procedure: MINIMALLY INVASIVE MITRAL VALVE REPAIR (MVR);  Surgeon: Rexene Alberts, MD;  Location: [facility];  Service: Open Heart Surgery;  Laterality: Right;  . TRANSESOPHAGEAL ECHOCARDIOGRAM  7/12  . US ECHOCARDIOGRAPHY   6/12  . VENTRICULOPERITONEAL SHUNT  1971    Social History Michaela [name]  reports that she quit smoking about 25 years ago. She has never used smokeless tobacco. She reports that she drinks alcohol. She reports that she does not use drugs.  family history includes Coronary artery disease (age of onset: 74) in her father; Heart attack in her maternal grandfather; Hyperlipidemia in her mother.  Allergies  Allergen Reactions  . Compazine Other (See Comments)    "eyes get buggy"       PHYSICAL EXAMINATION: Vital signs: BP (!) 90/50 (BP Location: Left Arm, Patient Position: Sitting, Cuff Size: Normal)   Pulse 80   Ht 4' 8.5" (1.435 m) Comment: height measured without shoes  Wt 79 lb (35.8 kg)   BMI 17.40 kg/m   Constitutional: Thin, chronically ill-appearing, no acute distress Psychiatric: alert and oriented x3, cooperative Eyes: extraocular movements intact, anicteric, conjunctiva pink Mouth: oral pharynx moist, no lesions Neck: supple no lymphadenopathy Cardiovascular: heart regular rate and rhythm, no murmur Lungs: clear to auscultation bilaterally Abdomen: soft, nontender, nondistended, no obvious ascites, no peritoneal signs, normal bowel sounds, no organomegaly Rectal: Omitted Extremities: no clubbing cyanosis or lower extremity edema bilaterally Skin: no lesions on visible extremities Neuro: Central tremor.  ASSESSMENT:  #1. Chronic constipation. Functional. Improved linzess #2. Colon cancer screening. Normal colonoscopy 2005. Multiple options discussed  PLAN:  #1. Continue linzess 145 g daily #2. Add MiraLAX. Titrate to need #3. Cologuard. If positive colonoscopy at the hospital. If negative repeat in 3 years #4. Resume care with PCP,  otherwise.

## 2016-08-21 NOTE — Patient Instructions (Signed)
As discussed with Dr. Henrene Pastor, you may add Miralax to your Linzess as needed  You will be contacted by [employer] to initiate the Cologuard process.

## 2016-10-04 ENCOUNTER — Encounter: Payer: Self-pay | Admitting: Family Medicine

## 2016-10-04 ENCOUNTER — Ambulatory Visit (INDEPENDENT_AMBULATORY_CARE_PROVIDER_SITE_OTHER): Payer: Medicare Other | Admitting: Family Medicine

## 2016-10-04 VITALS — BP 100/60 | HR 68 | Resp 12 | Ht <= 58 in | Wt 77.2 lb

## 2016-10-04 DIAGNOSIS — R399 Unspecified symptoms and signs involving the genitourinary system: Secondary | ICD-10-CM

## 2016-10-04 DIAGNOSIS — F419 Anxiety disorder, unspecified: Secondary | ICD-10-CM

## 2016-10-04 DIAGNOSIS — N319 Neuromuscular dysfunction of bladder, unspecified: Secondary | ICD-10-CM | POA: Diagnosis not present

## 2016-10-04 DIAGNOSIS — N390 Urinary tract infection, site not specified: Secondary | ICD-10-CM

## 2016-10-04 DIAGNOSIS — K5909 Other constipation: Secondary | ICD-10-CM | POA: Diagnosis not present

## 2016-10-04 LAB — POCT URINALYSIS DIPSTICK
Bilirubin, UA: NEGATIVE
GLUCOSE UA: NEGATIVE
KETONES UA: NEGATIVE
Leukocytes, UA: NEGATIVE
Nitrite, UA: NEGATIVE
Protein, UA: NEGATIVE
RBC UA: NEGATIVE
UROBILINOGEN UA: 0.2
pH, UA: 6

## 2016-10-04 MED ORDER — LINACLOTIDE 145 MCG PO CAPS: ORAL_CAPSULE | ORAL | 2 refills | Status: DC

## 2016-10-04 MED ORDER — NITROFURANTOIN MONOHYD MACRO 100 MG PO CAPS: 100.0000 mg | ORAL_CAPSULE | Freq: Two times a day (BID) | ORAL | 0 refills | Status: AC

## 2016-10-04 MED ORDER — ESCITALOPRAM OXALATE 10 MG PO TABS: 10.0000 mg | ORAL_TABLET | Freq: Every day | ORAL | 1 refills | Status: DC

## 2016-10-04 NOTE — Patient Instructions (Addendum)
  [name] I have seen you today for an acute visit.  A few things to remember from today's visit:   UTI symptoms - Plan: POCT urinalysis dipstick, Urine culture  Urinary tract infection without hematuria, site unspecified - Plan: Urine culture, nitrofurantoin, macrocrystal-monohydrate, (MACROBID) 100 MG capsule  Chronic constipation - Plan: linaclotide (LINZESS) 145 MCG CAPS capsule  Anxiety disorder, unspecified type  Neurogenic bladder     Adequate fluid intake, avoid holding urine for long hours, and over the counter Vit C OR cranberry capsules might help.  Today we will treat empirically with antibiotic, which we might need to change when urine culture comes back depending of bacteria susceptibility.  Seek immediate medical attention if severe abdominal pain, vomiting, fever/chills, or worsening symptoms. F/U if symptomatic are not any better after 2-3 days of antibiotic treatment.       Medications prescribed today will not be refill upon request.     In general please monitor for signs of worsening symptoms and seek immediate medical attention if any concerning/warning symptom.  Today Lexapro increased and Linzess to continue. Urology appointment will be arranged.

## 2016-10-04 NOTE — Progress Notes (Signed)
Pre visit review using our clinic review tool, if applicable. No additional management support is needed unless otherwise documented below in the visit note. 

## 2016-10-04 NOTE — Progress Notes (Signed)
HPI:  ACUTE VISIT: Chief Complaint  Patient presents with  . Urinary Tract Infection    [name] is a 61 y.o. female, who is here today with her sister complaining of 4 weeks of intermittent urinary symptoms:Dysuria and odorous urine. She feels like symptoms are getting worse. She has not identified exacerbating or alleviating factors. She has Hx of neurogenic bladder, problem seems to be stable. She wears pull ups, has not noted perineal rash. She has refused urology evaluation despite the fact she has frequent complaints about this problems during OV's. She is on Trospium Chloride but does not seem to be helping.  She denies fever, decreased urine output,or gross hematuria.  She denies associated abdominal pain,nausea or vomiting. She has not noted vaginal pruritus,bleeding, or discharge  Hx of recurrent UTI.  OTC medications for this problem: None  -She also have other concerns she would like to address today.  Anxiety: She was started on Lexapro 5 mg daily on 08/05/16 after she tried Sertraline and did not tolerated well. Lexapro, is helping, her sister has noted that she does not get overly stress about things, she can deal better with certain situations. [name] reports mild improvement. She would lie to try higher dose. She has tolerated well, denies side effects. She denies depressed mood or suicidal thoughts.  Constipation: She is also very glad with Linzess 145 mcg, which she is taking daily. She is also taking Miralax just when needed, which is not very often. Requesting a refill.  Hearing loss  Many years of hearing loss. Her sister would like audiologists evaluation. She denies earache, dizziness, or tinnitus.   Review of Systems  Constitutional: Negative for activity change, appetite change, fatigue and fever.  HENT: Positive for hearing loss. Negative for ear discharge, ear pain, mouth sores, sore throat, trouble swallowing and  voice change.   Respiratory: Negative for cough and shortness of breath.   Gastrointestinal: Negative for abdominal pain, blood in stool, nausea and vomiting.  Genitourinary: Positive for dysuria. Negative for decreased urine volume, hematuria, vaginal bleeding and vaginal discharge.  Musculoskeletal: Positive for gait problem. Negative for joint swelling.  Skin: Negative for rash.  Neurological: Negative for syncope and headaches.  Psychiatric/Behavioral: Negative for confusion, sleep disturbance and suicidal ideas. The patient is nervous/anxious.       Current Outpatient Prescriptions on File Prior to Visit  Medication Sig Dispense Refill  . aspirin EC 81 MG tablet Take 1 tablet (81 mg total) by mouth daily. 90 tablet 3  . atorvastatin (LIPITOR) 10 MG tablet TAKE 1 TABLET (10 MG TOTAL) BY MOUTH DAILY. 90 tablet 3  . denosumab (PROLIA) 60 MG/ML SOLN injection Inject 60 mg into the skin every 6 (six) months. Administer in upper arm, thigh, or abdomen 1 Syringe 0  . polyethylene glycol powder (GLYCOLAX/MIRALAX) powder Take 17 g by mouth daily. 3350 g 1  . Probiotic Product ([place]) CAPS Take by mouth daily.    . Trospium Chloride 60 MG CP24 TAKE 1 CAPSULE (60 MG TOTAL) BY MOUTH DAILY. 90 capsule 2  . vitamin B-12 (CYANOCOBALAMIN) 1000 MCG tablet Take 1,000 mcg by mouth daily.    . [DISCONTINUED] imipramine (TOFRANIL) 25 MG tablet Take 25 mg by mouth at bedtime.     . [DISCONTINUED] metoprolol tartrate (LOPRESSOR) 12.5 mg TABS Take 12.5 mg by mouth 2 (two) times daily.    . [DISCONTINUED] polysaccharide iron (NIFEREX) 150 MG CAPS capsule Take 1 capsule (150 mg  total) by mouth daily. 30 each 0  . [DISCONTINUED] pravastatin (PRAVACHOL) 80 MG tablet Take 80 mg by mouth daily.     No current facility-administered medications on file prior to visit.      Past Medical History:  Diagnosis Date  . 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 C.  difficile colitis [id number]   complicated by sepsis/ARDS and prolonged hosp  . Depression   . Hyperlipidemia   . Mitral regurgitation 10/2011   severe, s/p min invasive repair and annuloplasty  . Mitral valve prolapse   . Neuromuscular disorder (HCC)    numbness in hands and feet  . Osteoarthritis   . Osteoporosis    DEXA @ [facility] 11/2012: -3.5, prior tx Reclast, last in 2011, rec change to Prolia  . Overactive bladder   . Pleural effusion, left 11/11/2011  . Vitamin D deficiency    Allergies  Allergen Reactions  . Compazine Other (See Comments)    "eyes get buggy"    Social History   Social History  . Marital status: Single    Spouse name: N/A  . Number of children: 0  . Years of education: N/A   Occupational History  . disabled    Social History Main Topics  . Smoking status: Former Smoker    Quit date: 09/09/1990  . Smokeless tobacco: Never Used  . Alcohol use 0.0 oz/week     Comment: rarely  . Drug use: No  . Sexual activity: Not Asked   Other Topics Concern  . None   Social History Narrative  . None    Vitals:   10/04/16 1043  BP: 100/60  Pulse: 68  Resp: 12   O2 sat at RA 96%.  Body mass index is 16.15 kg/m.  Physical Exam  Nursing note and vitals reviewed. Constitutional: She is oriented to person, place, and time. She appears well-developed. No distress.  Underweight   HENT:  Head: Atraumatic.  Eyes: Conjunctivae are normal.  Cardiovascular: Normal rate and regular rhythm.   No murmur heard. Respiratory: Effort normal and breath sounds normal. No respiratory distress.  GI: Soft. She exhibits no mass. There is no tenderness. There is no CVA tenderness.  Musculoskeletal: She exhibits no edema.  Neurological: She is alert and oriented to person, place, and time. She displays atrophy and tremor. She exhibits abnormal muscle tone.  Spasticity, she is in her wheel chair.  Skin: Skin is warm. No erythema.  Psychiatric: Her mood appears anxious.  Cognition and memory are normal. She expresses no suicidal ideation.  Well groomed, good eye contact.      ASSESSMENT AND PLAN:     Michaela [name] was seen today for urinary tract infection.  Diagnoses and all orders for this visit:   UTI symptoms -     POCT urinalysis dipstick -     Urine culture  Urinary tract infection without hematuria, site unspecified   Possible causes of reported urinary symptoms discussed. Because she has had worsening dysuria, I will treat as UTI. Ucx ordered.   Empiric abx treatment started today and will be tailored according to Ucx results and susceptibility report.  Clearly instructed about warning signs. F/U if symptoms persist.  -     Urine culture -     nitrofurantoin, macrocrystal-monohydrate, (MACROBID) 100 MG capsule; Take 1 capsule (100 mg total) by mouth 2 (two) times daily.  Chronic constipation  Improved. No changes in Linzess dose. Adequate fiber and oral fluid intake.  F/U in 6-12 months.  -     linaclotide (LINZESS) 145 MCG CAPS capsule; TAKE ONE CAPSULE BY MOUTH DAILY BEFORE BREAKFAST  Anxiety disorder, unspecified type  Improved. Lexapro increased from 5 mg to 10 mg. Some side effects discussed. F/U in 3-4 months.  -     escitalopram (LEXAPRO) 10 MG tablet; Take 1 tablet (10 mg total) by mouth daily.  Neurogenic bladder  She would like appt with urologists to discuss treatment options. Referral placed, Dr Linna Darner.     -Ms.KARN BARONA was advised to return or notify a doctor immediately if symptoms worsen or persist or new concerns arise.       Betty G. Martinique, MD  [facility]. [place] office.

## 2016-10-06 ENCOUNTER — Encounter: Payer: Self-pay | Admitting: Family Medicine

## 2016-10-06 LAB — URINE CULTURE

## 2016-10-14 ENCOUNTER — Ambulatory Visit: Payer: Medicare Other | Admitting: Family Medicine

## 2016-11-06 ENCOUNTER — Telehealth: Payer: Self-pay

## 2016-11-06 NOTE — Telephone Encounter (Signed)
A cologuard was ordered at 08-21-2016 office visit. This has been suspended due to inactivity.

## 2016-11-10 NOTE — Telephone Encounter (Signed)
Please contact patient (Patient's sister) to let them know. See what they want to do. Let me know and document. Thanks

## 2016-11-11 NOTE — Telephone Encounter (Signed)
Left message for [name], the pts sister, to call back.

## 2016-11-13 DIAGNOSIS — N3941 Urge incontinence: Secondary | ICD-10-CM | POA: Diagnosis not present

## 2016-11-13 DIAGNOSIS — R350 Frequency of micturition: Secondary | ICD-10-CM | POA: Diagnosis not present

## 2016-11-13 NOTE — Telephone Encounter (Signed)
Spoke with pts sister and they had a difficult time collecting the specimen. They do want to try and complete the test again. She requested another kit be sent out to them to complete. Message left for her that the company cologuard will send them another kit if they call the customer service number on their paperwork.

## 2016-11-15 ENCOUNTER — Telehealth: Payer: Self-pay | Admitting: Family Medicine

## 2016-11-15 ENCOUNTER — Other Ambulatory Visit: Payer: Self-pay

## 2016-11-15 DIAGNOSIS — Z0110 Encounter for examination of ears and hearing without abnormal findings: Secondary | ICD-10-CM

## 2016-11-15 NOTE — Telephone Encounter (Signed)
Referral has been placed. 

## 2016-11-15 NOTE — Telephone Encounter (Signed)
Called and left voicemail for Mrs. Michaela [name]. Let her know that paper has been faxed over to [facility], and needed to know if she needed to come pick up the original copies due to the POA being in the folder. Advised I'd place it up front in case she needs to come get it.

## 2016-11-15 NOTE — Telephone Encounter (Addendum)
I signed a form to authorize hearing evaluation but I have not received any other form recently.  Thanks, BJ

## 2016-11-15 NOTE — Telephone Encounter (Signed)
Pts sister is calling to check on the status of the pw that was dropped off on 3/518 and would like to know if it will be ready before March 15 when the pt has to take it to the cardiologist.

## 2016-11-15 NOTE — Telephone Encounter (Signed)
Michaela [name] with [facility] needs a formal referral from Dr Martinique stating why pt needs to be seen there. The paper they gave to you was just to inform dr  guideline on what they need which medicare requires for them to have a valid referral  It could even state for hearing eval.  Michaela [name] will fax back the form  Fax;  [phone]

## 2016-11-18 ENCOUNTER — Telehealth: Payer: Self-pay | Admitting: Neurology

## 2016-11-18 ENCOUNTER — Ambulatory Visit: Payer: Medicare Other | Admitting: Neurology

## 2016-11-18 NOTE — Telephone Encounter (Signed)
Informed patient office was closing due to weather.

## 2016-11-21 ENCOUNTER — Ambulatory Visit: Payer: Medicare Other | Admitting: Neurology

## 2016-12-04 DIAGNOSIS — H903 Sensorineural hearing loss, bilateral: Secondary | ICD-10-CM | POA: Diagnosis not present

## 2016-12-04 DIAGNOSIS — H838X3 Other specified diseases of inner ear, bilateral: Secondary | ICD-10-CM | POA: Diagnosis not present

## 2016-12-04 DIAGNOSIS — H6123 Impacted cerumen, bilateral: Secondary | ICD-10-CM | POA: Diagnosis not present

## 2016-12-13 ENCOUNTER — Other Ambulatory Visit: Payer: Self-pay | Admitting: Internal Medicine

## 2016-12-17 ENCOUNTER — Telehealth: Payer: Self-pay

## 2016-12-17 NOTE — Telephone Encounter (Signed)
Advised patient that prolia injection is due either on/after 12/31/16----estimated copay is $250---insurance has been verified, ok to give per Davanee Klinkner---patient will call back to schedule nurse visit at later date

## 2016-12-20 ENCOUNTER — Other Ambulatory Visit: Payer: Self-pay

## 2016-12-20 MED ORDER — ATORVASTATIN CALCIUM 10 MG PO TABS: ORAL_TABLET | ORAL | 0 refills | Status: DC

## 2017-01-01 ENCOUNTER — Encounter: Payer: Self-pay | Admitting: Neurology

## 2017-01-01 ENCOUNTER — Ambulatory Visit (INDEPENDENT_AMBULATORY_CARE_PROVIDER_SITE_OTHER): Payer: Medicare Other | Admitting: Neurology

## 2017-01-01 VITALS — BP 88/58 | HR 83 | Temp 97.6°F | Resp 16 | Ht <= 58 in | Wt 77.4 lb

## 2017-01-01 DIAGNOSIS — G252 Other specified forms of tremor: Secondary | ICD-10-CM | POA: Diagnosis not present

## 2017-01-01 DIAGNOSIS — R270 Ataxia, unspecified: Secondary | ICD-10-CM

## 2017-01-01 MED ORDER — CLONAZEPAM 0.25 MG PO TBDP: 0.2500 mg | ORAL_TABLET | Freq: Three times a day (TID) | ORAL | 0 refills | Status: DC | PRN

## 2017-01-01 NOTE — Progress Notes (Signed)
NEUROLOGY CONSULTATION NOTE  Michaela [name] MRN: [id number] DOB: Aug 06, 1956  Referring provider: Dr. Martinique Primary care provider: Dr. Martinique  Reason for consult:  ataxia  HISTORY OF PRESENT ILLNESS: Michaela [name] is a 61 year old  female with depression, anxiety, neurogenic bladder and history of cerebral astrocytoma who presents for ataxia.  She is accompanied by her sister who supplements history.  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  She has residual spasticity, cerebellar dysfunction and neurogenic bladder.  She is nonambulatory at baseline.  However, she is able to transition herself from the wheelchair to the bathroom.  In 2013, she underwent mitral valve repair.  Since then, she reports that her shaking has gotten worse.  The shaking is episodic and usually occurs in the morning when she gets up.  Otherwise, she is unaware of any triggers.  Nothing seems to calm it, it just resolves spontaneously.  It caused her anxiety and she was started on Lexapro, which has helped the anxiety but not the shaking.  When it occurs getting out of bed, she has had falls.  Otherwise, she reports no new symptoms.  Most recent brain image available is a head CT from 11/25/11, which was personally reviewed and revealed white matter hyperintensities in the bilateral parietal lobes and right lateral ventricular shunt but no acute findings.  PAST MEDICAL HISTORY: Past Medical History:  Diagnosis Date  . 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 C. difficile colitis 03/7823   complicated by sepsis/ARDS and prolonged hosp  . Depression   . Hyperlipidemia   . Mitral regurgitation 10/2011   severe, s/p min invasive repair and annuloplasty  . Mitral valve prolapse   . Neuromuscular disorder (HCC)    numbness in hands and feet  . Osteoarthritis   . Osteoporosis    DEXA @ [facility] 11/2012:  -3.5, prior tx Reclast, last in 2011, rec change to Prolia  . Overactive bladder   . Pleural effusion, left 11/11/2011  . Vitamin D deficiency     PAST SURGICAL HISTORY: Past Surgical History:  Procedure Laterality Date  . CARDIAC CATHETERIZATION     Jan 2013  . KNEE ARTHROSCOPY Right   . MITRAL VALVE REPAIR  10/15/2011   Procedure: MINIMALLY INVASIVE MITRAL VALVE REPAIR (MVR);  Surgeon: Rexene Alberts, MD;  Location: [place];  Service: Open Heart Surgery;  Laterality: Right;  . TRANSESOPHAGEAL ECHOCARDIOGRAM  7/12  . US ECHOCARDIOGRAPHY  6/12  . VENTRICULOPERITONEAL SHUNT  1971    MEDICATIONS: Current Outpatient Prescriptions on File Prior to Visit  Medication Sig Dispense Refill  . aspirin EC 81 MG tablet Take 1 tablet (81 mg total) by mouth daily. 90 tablet 3  . atorvastatin (LIPITOR) 10 MG tablet TAKE 1 TABLET (10 MG TOTAL) BY MOUTH DAILY. 90 tablet 0  . denosumab (PROLIA) 60 MG/ML SOLN injection Inject 60 mg into the skin every 6 (six) months. Administer in upper arm, thigh, or abdomen 1 Syringe 0  . escitalopram (LEXAPRO) 10 MG tablet Take 1 tablet (10 mg total) by mouth daily. 90 tablet 1  . linaclotide (LINZESS) 145 MCG CAPS capsule TAKE ONE CAPSULE BY MOUTH DAILY BEFORE BREAKFAST 90 capsule 2  . polyethylene glycol powder (GLYCOLAX/MIRALAX) powder Take 17 g by mouth daily. 3350 g 1  . vitamin B-12 (CYANOCOBALAMIN) 1000 MCG tablet Take 1,000 mcg by mouth daily.    . [DISCONTINUED]  imipramine (TOFRANIL) 25 MG tablet Take 25 mg by mouth at bedtime.     . [DISCONTINUED] metoprolol tartrate (LOPRESSOR) 12.5 mg TABS Take 12.5 mg by mouth 2 (two) times daily.    . [DISCONTINUED] polysaccharide iron (NIFEREX) 150 MG CAPS capsule Take 1 capsule (150 mg total) by mouth daily. 30 each 0  . [DISCONTINUED] pravastatin (PRAVACHOL) 80 MG tablet Take 80 mg by mouth daily.     No current facility-administered medications on file prior to visit.     ALLERGIES: Allergies  Allergen Reactions    . Compazine Other (See Comments)    "eyes get buggy"    FAMILY HISTORY: Family History  Problem Relation Age of Onset  . Coronary artery disease Father 16    died MI  . Hyperlipidemia Mother   . Heart attack Maternal Grandfather     SOCIAL HISTORY: Social History   Social History  . Marital status: Single    Spouse name: N/A  . Number of children: 0  . Years of education: N/A   Occupational History  . disabled    Social History Main Topics  . Smoking status: Former Smoker    Quit date: 09/09/1990  . Smokeless tobacco: Never Used  . Alcohol use 0.0 oz/week     Comment: rarely  . Drug use: No  . Sexual activity: Not on file   Other Topics Concern  . Not on file   Social History Narrative  . No narrative on file    REVIEW OF SYSTEMS: Constitutional: No fevers, chills, or sweats, no generalized fatigue, change in appetite Eyes: No visual changes, double vision, eye pain Ear, nose and throat: No hearing loss, ear pain, nasal congestion, sore throat Cardiovascular: No chest pain, palpitations Respiratory:  No shortness of breath at rest or with exertion, wheezes GastrointestinaI: No nausea, vomiting, diarrhea, abdominal pain, fecal incontinence Genitourinary:  No dysuria, urinary retention or frequency Musculoskeletal:  Right shoulder pain Integumentary: No rash, pruritus, skin lesions Neurological: as above Psychiatric: No depression, insomnia, anxiety Endocrine: No palpitations, fatigue, diaphoresis, mood swings, change in appetite, change in weight, increased thirst Hematologic/Lymphatic:  No purpura, petechiae. Allergic/Immunologic: no itchy/runny eyes, nasal congestion, recent allergic reactions, rashes  PHYSICAL EXAM: Vitals:   01/01/17 1315  BP: (!) 88/58  Pulse: 83  Resp: 16  Temp: 97.6 F (36.4 C)   General: No acute distress.  Patient appears well-groomed.  Head:  Normocephalic/atraumatic Eyes:  fundi examined but not visualized Neck: supple,  no paraspinal tenderness, full range of motion Back: No paraspinal tenderness Heart: regular rate and rhythm Lungs: Clear to auscultation bilaterally. Vascular: No carotid bruits. Neurological Exam: Mental status: alert and oriented to person, place, and time, recent and remote memory intact, fund of knowledge intact, attention and concentration intact, speech fluent and not dysarthric, language intact. Cranial nerves: CN I: not tested CN II: pupils equal, round and reactive to light, visual fields intact CN III, IV, VI:  Left eye adducted on primary gaze, unable to abduct left eye, saccadic eye movements with tracking nystagmus, no ptosis CN V: facial sensation intact CN VII: upper and lower face symmetric CN VIII: hearing intact CN IX, X: gag intact, uvula midline CN XI: sternocleidomastoid and trapezius muscles intact CN XII: tongue midline Bulk & Tone: decreased bulk, increased tone, no fasciculations. Motor:  5/5 throughout  Sensation: temperature and vibration sensation intact. Deep Tendon Reflexes:  2+ throughout, bilateral Babinski Finger to nose testing:  Dysmetria bilaterally Heel to shin:  Dysmetria bilaterally Gait:  Nonambulatory.  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 Low blood pressure, baseline  PLAN: Treatment of cerebellar tremor is very difficult.  Typical medications such as primidone are not effective.  We will try low-dose clonazepam 0.25mg .  She may take up to 3 times daily as needed when she feels a shaking spell occur.  I advised to have somebody with her when she first tries it, to make sure she tolerates it and doesn't feel faint, dizzy or drowsy.  I advised that they contact her PCP regarding right shoulder pain.  Follow up in 3 months.  Thank you for allowing me to take part in the care of this patient.  Metta Clines, DO  CC:  Michaela G Martinique, MD

## 2017-01-01 NOTE — Patient Instructions (Signed)
1.  We will try a low dose of clonazepam (0.25mg ), which is a benzodiazepine.  It may cause drowsiness or dizziness, so make sure somebody is with you when you take it the first time to prevent fall.  May take up to 3 times daily as needed.   2.  Follow up in 3 months.

## 2017-01-06 ENCOUNTER — Ambulatory Visit (INDEPENDENT_AMBULATORY_CARE_PROVIDER_SITE_OTHER): Payer: Medicare Other | Admitting: Family Medicine

## 2017-01-06 ENCOUNTER — Encounter: Payer: Self-pay | Admitting: Family Medicine

## 2017-01-06 VITALS — BP 110/70 | HR 87 | Resp 12 | Ht <= 58 in

## 2017-01-06 DIAGNOSIS — F419 Anxiety disorder, unspecified: Secondary | ICD-10-CM

## 2017-01-06 DIAGNOSIS — M25512 Pain in left shoulder: Secondary | ICD-10-CM

## 2017-01-06 DIAGNOSIS — G8929 Other chronic pain: Secondary | ICD-10-CM

## 2017-01-06 DIAGNOSIS — W19XXXA Unspecified fall, initial encounter: Secondary | ICD-10-CM | POA: Diagnosis not present

## 2017-01-06 DIAGNOSIS — K5909 Other constipation: Secondary | ICD-10-CM | POA: Diagnosis not present

## 2017-01-06 DIAGNOSIS — M25511 Pain in right shoulder: Secondary | ICD-10-CM | POA: Diagnosis not present

## 2017-01-06 MED ORDER — DICLOFENAC SODIUM 1 % TD GEL: 4.0000 g | Freq: Four times a day (QID) | TRANSDERMAL | 3 refills | Status: DC

## 2017-01-06 MED ORDER — LINACLOTIDE 145 MCG PO CAPS: ORAL_CAPSULE | ORAL | 2 refills | Status: DC

## 2017-01-06 MED ORDER — ESCITALOPRAM OXALATE 10 MG PO TABS: 10.0000 mg | ORAL_TABLET | Freq: Every day | ORAL | 1 refills | Status: DC

## 2017-01-06 NOTE — Progress Notes (Signed)
Pre visit review using our clinic review tool, if applicable. No additional management support is needed unless otherwise documented below in the visit note. 

## 2017-01-06 NOTE — Patient Instructions (Signed)
A few things to remember from today's visit:   Chronic constipation - Plan: linaclotide (LINZESS) 145 MCG CAPS capsule  Anxiety disorder, unspecified type - Plan: escitalopram (LEXAPRO) 10 MG tablet  Chronic pain of both shoulders - Plan: diclofenac sodium (VOLTAREN) 1 % GEL  Continue PT at home.  Osteoarthritis is a chronic condition and gets worse with age.  The following may help:  Over the counter topical medications: Icy Hot or Asper cream with Lidocaine. Tai Chi or PT. Fall prevention.      Please be sure medication list is accurate. If a new problem present, please set up appointment sooner than planned today.

## 2017-01-06 NOTE — Progress Notes (Signed)
HPI:   [name] is a 61 y.o. female, who is here today with her sister to follow on some chronic medical problems.  Sine her last OV, 10/04/16, she has seen urologists. She started a new medication for neurogenic bladder and urine incontinence, Myrbetriq 50 mg. She is glad with the results, helping with urinary symptoms and tolerating well.  Also evaluated by ENT and audiologist, underwent ear lavage and hearing loss resolved.  She completed PT sessions but she is still doing the PT exercises at home. This exercises have helped with posture and flexibility.  On 01/01/17 she saw Dr Michaela [name], neuro. Clonazepam 0.25 mg tid prn recommended as needed for cerebellar tremor.   Anxiety: She is taking Lexapro, which was increased from 5 mg to 10 mg last OV. Medication has helped but she decreased it back to 5 mg because medication is causing drowsiness. She takes medication around 9 pm. According to sister,she gets in bed around 6-7 pm,watches TV until 11-12 midnight; get up next day around 11 Am Next day she feels sleepy and tired. She has not try to take medication a different time.   Constipation: She is on Linzess 145 mg daily, which has helped. She denies side effects, having bowel movements daily.  Denies abdominal pain, nausea, vomiting, blood in stool or melena.    -Today she is c/o bilateral shoulder pain R>L that started about a year ago, getting worse. Pain is exacerbated by movement. She would like X ray. No recent fall or trauma. Limitation of ROM.  Right shoulder sharp/achy ,intermittent pain, "very bad." It is aggravated by flexion of elbow. No edema or erythema. No numbness or tingling. She has not taken medication for pain.  Sine her last OV she had a fall, slipped when she was transferring from toilet to chair at midnight. Her sister was out of town and her mother could not come to help her. She was able to get up, did not seek medical attention,  denies residual problem after fall.    Review of Systems  Constitutional: Positive for fatigue. Negative for appetite change and fever.  HENT: Negative for mouth sores, nosebleeds and trouble swallowing.   Respiratory: Negative for cough, shortness of breath and wheezing.   Cardiovascular: Negative for chest pain, palpitations and leg swelling.  Gastrointestinal: Negative for abdominal pain, blood in stool, nausea and vomiting.  Genitourinary: Negative for decreased urine volume and hematuria.  Musculoskeletal: Positive for arthralgias and gait problem.  Skin: Negative for pallor and rash.  Neurological: Negative for syncope, weakness, numbness and headaches.  Psychiatric/Behavioral: Negative for confusion, hallucinations and suicidal ideas. The patient is nervous/anxious.       Current Outpatient Prescriptions on File Prior to Visit  Medication Sig Dispense Refill  . aspirin EC 81 MG tablet Take 1 tablet (81 mg total) by mouth daily. 90 tablet 3  . atorvastatin (LIPITOR) 10 MG tablet TAKE 1 TABLET (10 MG TOTAL) BY MOUTH DAILY. 90 tablet 0  . clonazePAM (KLONOPIN) 0.25 MG disintegrating tablet Take 1 tablet (0.25 mg total) by mouth 3 (three) times daily as needed for seizure. 30 tablet 0  . denosumab (PROLIA) 60 MG/ML SOLN injection Inject 60 mg into the skin every 6 (six) months. Administer in upper arm, thigh, or abdomen 1 Syringe 0  . MYRBETRIQ 50 MG TB24 tablet Take 50 mg by mouth daily.    . polyethylene glycol powder (GLYCOLAX/MIRALAX) powder Take 17 g by mouth daily. 3350 g 1  .  vitamin B-12 (CYANOCOBALAMIN) 1000 MCG tablet Take 1,000 mcg by mouth daily.    . [DISCONTINUED] imipramine (TOFRANIL) 25 MG tablet Take 25 mg by mouth at bedtime.     . [DISCONTINUED] metoprolol tartrate (LOPRESSOR) 12.5 mg TABS Take 12.5 mg by mouth 2 (two) times daily.    . [DISCONTINUED] polysaccharide iron (NIFEREX) 150 MG CAPS capsule Take 1 capsule (150 mg total) by mouth daily. 30 each 0  .  [DISCONTINUED] pravastatin (PRAVACHOL) 80 MG tablet Take 80 mg by mouth daily.     No current facility-administered medications on file prior to visit.      Past Medical History:  Diagnosis Date  . Astrocytoma brain tumor ([place]) 1971   astrocytoma treated with radiation  . C. difficile colitis 04/1190   complicated by sepsis/ARDS and prolonged hosp  . Depression   . Hyperlipidemia   . Mitral regurgitation 10/2011   severe, s/p min invasive repair and annuloplasty  . Mitral valve prolapse   . Neuromuscular disorder ([facility])    numbness in hands and feet  . Osteoarthritis   . Osteoporosis    DEXA @ [facility] 11/2012: -3.5, prior tx Reclast, last in 2011, rec change to Prolia  . Overactive bladder   . Pleural effusion, left 11/11/2011  . Vitamin D deficiency    Allergies  Allergen Reactions  . Compazine Other (See Comments)    "eyes get buggy"    Social History   Social History  . Marital status: Single    Spouse name: N/A  . Number of children: 0  . Years of education: N/A   Occupational History  . disabled    Social History Main Topics  . Smoking status: Former Smoker    Quit date: 09/09/1990  . Smokeless tobacco: Never Used  . Alcohol use 0.0 oz/week     Comment: rarely  . Drug use: No  . Sexual activity: Not Asked   Other Topics Concern  . None   Social History Narrative  . None    Vitals:   01/06/17 1039  BP: 110/70  Pulse: 87  Resp: 12  O2 sat at RA 99% There is no height or weight on file to calculate BMI.   Physical Exam  Nursing note and vitals reviewed. Constitutional: She is oriented to person, place, and time. She appears well-developed. No distress.  HENT:  Head: Atraumatic.  Mouth/Throat: Oropharynx is clear and moist and mucous membranes are normal.  Eyes: Conjunctivae are normal.  Cardiovascular: Normal rate and regular rhythm.   No murmur heard. Respiratory: Effort normal and breath sounds normal. No respiratory distress.  GI: Soft. She  exhibits no mass. There is no tenderness.  Musculoskeletal: She exhibits no edema.       Right elbow: She exhibits no deformity. No tenderness found.  Shoulder: No major deformity appreciated,no edema or erythema appreciated. + Muscle atrophy. Luan Pulling' test neg, empty can supraspinatus test negative, cross body adduction test neg, lift-Off Subscapularis test neg. Shoulder ROM otherwise normal,pain elicited with flexion of elbow,pain upon pressing right bicep's tendon.   Lymphadenopathy:    She has no cervical adenopathy.  Neurological: She is alert and oriented to person, place, and time. She displays atrophy and tremor. She exhibits abnormal muscle tone. Coordination abnormal.  She is in a wheel chair  Skin: Skin is warm. No erythema.  Psychiatric: Her mood appears anxious. She expresses no suicidal ideation.  Well groomed, good eye contact.    ASSESSMENT AND PLAN:   Kanani was seen  today for follow-up.  Diagnoses and all orders for this visit:  Chronic pain of both shoulders  Right shoulder getting worse. ? Biceps tendinitis and OA We discussed treatment options.  PT may be beneficial, she prefers to hold on PT referral, she will continue ROM exercsies. Topical Voltaren may also help. F/U as needed.  -     diclofenac sodium (VOLTAREN) 1 % GEL; Apply 4 g topically 4 (four) times daily.  Chronic constipation  Well controlled. Adequate fluid intake as well as fiber.  No changes in current management. F/U in 6-12 months.  -     linaclotide (LINZESS) 145 MCG CAPS capsule; TAKE ONE CAPSULE BY MOUTH DAILY BEFORE BREAKFAST  Anxiety disorder, unspecified type  Improved. I recommend continuing Lexapro 10 mg, it was just increased a few months ago and Lexapro 5 mg did not help much. She can try taking medication in the morning. Good sleep hygiene. F/U in 4 months.  -     escitalopram (LEXAPRO) 10 MG tablet; Take 1 tablet (10 mg total) by mouth daily.  Accidental fall,  initial encounter  We discussed fall prevention. Some medication she is taking as well as her Hx of neuromuscular disease can increase the risk of falls. PT was completed.     -Ms. Michaela [name] was advised to return sooner than planned today if new concerns arise.       Demetris Capell G. [place], MD  [facility]. [place] office.

## 2017-01-07 ENCOUNTER — Encounter: Payer: Self-pay | Admitting: Family Medicine

## 2017-01-09 ENCOUNTER — Other Ambulatory Visit: Payer: Self-pay | Admitting: Neurology

## 2017-01-10 ENCOUNTER — Telehealth: Payer: Self-pay | Admitting: Family Medicine

## 2017-01-10 NOTE — Telephone Encounter (Signed)
Pt states sister before pt never Paid a copay for the injection. Would like to know why it has changed. Pt is on fixed income and cannot afford it.

## 2017-01-10 NOTE — Telephone Encounter (Signed)
Left message with lisa/sister's voicemail, patient's plan rules for medications could have changed since last year or patient may have chosen another plan which made changes to medication coverage---if patient would like to try to qualify for patient assistance funds that would cover copay costs, she can call back and request phone number to patient assistance funding to see if she qualifies----can talk with tamara to get that number or if any other questions

## 2017-01-10 NOTE — Telephone Encounter (Signed)
I will be checking different programs to see who has available funding right now and call patient back with phone number to call

## 2017-01-10 NOTE — Telephone Encounter (Signed)
Pt would like to try to get qualified for patient assistance. Please LVM with phone number on Carolyne Littles phone.

## 2017-02-12 NOTE — Telephone Encounter (Signed)
Advised patient that [employer] currently has funding available, patient to call to request assistance and call Teliyah Royal back if she qualifies---can talk with Jaleah Lefevre when patient calls back

## 2017-03-02 ENCOUNTER — Emergency Department (HOSPITAL_COMMUNITY): Payer: Medicare Other

## 2017-03-02 ENCOUNTER — Encounter (HOSPITAL_COMMUNITY): Payer: Self-pay | Admitting: Emergency Medicine

## 2017-03-02 ENCOUNTER — Emergency Department (HOSPITAL_COMMUNITY)
Admission: EM | Admit: 2017-03-02 | Discharge: 2017-03-03 | Disposition: A | Discharge status: Home / Self Care | Payer: Medicare Other | Attending: Emergency Medicine | Admitting: Emergency Medicine

## 2017-03-02 DIAGNOSIS — Z87891 Personal history of nicotine dependence: Secondary | ICD-10-CM | POA: Insufficient documentation

## 2017-03-02 DIAGNOSIS — I5032 Chronic diastolic (congestive) heart failure: Secondary | ICD-10-CM | POA: Insufficient documentation

## 2017-03-02 DIAGNOSIS — W050XXA Fall from non-moving wheelchair, initial encounter: Secondary | ICD-10-CM | POA: Insufficient documentation

## 2017-03-02 DIAGNOSIS — S0990XA Unspecified injury of head, initial encounter: Secondary | ICD-10-CM | POA: Diagnosis not present

## 2017-03-02 DIAGNOSIS — Y999 Unspecified external cause status: Secondary | ICD-10-CM | POA: Insufficient documentation

## 2017-03-02 DIAGNOSIS — Y939 Activity, unspecified: Secondary | ICD-10-CM | POA: Insufficient documentation

## 2017-03-02 DIAGNOSIS — S0592XA Unspecified injury of left eye and orbit, initial encounter: Secondary | ICD-10-CM | POA: Diagnosis not present

## 2017-03-02 DIAGNOSIS — Y929 Unspecified place or not applicable: Secondary | ICD-10-CM | POA: Insufficient documentation

## 2017-03-02 DIAGNOSIS — R220 Localized swelling, mass and lump, head: Secondary | ICD-10-CM | POA: Insufficient documentation

## 2017-03-02 DIAGNOSIS — S01112A Laceration without foreign body of left eyelid and periocular area, initial encounter: Secondary | ICD-10-CM | POA: Diagnosis not present

## 2017-03-02 DIAGNOSIS — S0181XA Laceration without foreign body of other part of head, initial encounter: Secondary | ICD-10-CM

## 2017-03-02 DIAGNOSIS — Z7982 Long term (current) use of aspirin: Secondary | ICD-10-CM | POA: Diagnosis not present

## 2017-03-02 DIAGNOSIS — S0083XA Contusion of other part of head, initial encounter: Secondary | ICD-10-CM

## 2017-03-02 DIAGNOSIS — W19XXXA Unspecified fall, initial encounter: Secondary | ICD-10-CM

## 2017-03-02 MED ORDER — LIDOCAINE-EPINEPHRINE (PF) 2 %-1:200000 IJ SOLN
20.0000 mL | Freq: Once | INTRAMUSCULAR | Status: AC
  Administered 2017-03-02: 20 mL
  Filled 2017-03-02: qty 20

## 2017-03-02 NOTE — ED Triage Notes (Signed)
Patient does not walk. Patient is in a wheelchair. Patient was trying to transfer from chair to wheelchair and fell on the floor and hit her right eye. Patient has a laceration on the left eyebrow.

## 2017-03-02 NOTE — ED Provider Notes (Signed)
[facility] DEPT Provider Note   CSN: [id number] Arrival date & time: 03/02/17  2145     History   Chief Complaint Chief Complaint  Patient presents with  . Eye Injury  . Fall    HPI Michaela [name] is a 61 y.o. female with a hx of brain tumor with radiation and resulting lower extremity weakness presents to the Emergency Department complaining of acute, persistent laceration to the left eye after a mechanical fall several hours PTA.  Pt reports she was attempting to transfer from a standard chair to her wheelchair alone when she lost her balance and struck her face on the ground.  She reports no LOC, neck pain, dental pain, numbness, or new weakness.  She reports she came because she is concerned the laceration needs stitches.       The history is provided by the patient and medical records. No language interpreter was used.    Past Medical History:  Diagnosis Date  . 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 C. difficile colitis 03/5169   complicated by sepsis/ARDS and prolonged hosp  . Depression   . Hyperlipidemia   . Mitral regurgitation 10/2011   severe, s/p min invasive repair and annuloplasty  . Mitral valve prolapse   . Neuromuscular disorder ([facility])    numbness in hands and feet  . Osteoarthritis   . Osteoporosis    DEXA @ [facility] 11/2012: -3.5, prior tx Reclast, last in 2011, rec change to Prolia  . Overactive bladder   . Pleural effusion, left 11/11/2011  . Vitamin D deficiency     Patient Active Problem List   Diagnosis Date Noted  . Bilateral shoulder pain 01/06/2017  . Anxiety disorder 07/08/2016  . Chronic constipation 03/21/2016  . Routine general medical examination at a health care facility 11/09/2015  . Loss of weight   . Vitamin D deficiency   . Osteoporosis, unspecified   . Hyperlipidemia   . Paroxysmal atrial fibrillation ([place]) 03/03/2012  . Pituitary disorder ([place]) 02/11/2012  . Ataxia 01/29/2012  . Anemia  11/21/2011  . Diastolic CHF, chronic ([place]) 11/13/2011  . Pleural effusion, left 11/11/2011  . S/P mitral valve repair 10/15/2011  . Brain tumor, astrocytoma ([place]) 10/15/2011  . Neuromuscular disorder ([place]) 10/15/2011  . Neurogenic bladder 10/15/2011  . Arthritis 10/15/2011  . Mitral regurgitation due to cusp prolapse 07/08/2011    Past Surgical History:  Procedure Laterality Date  . CARDIAC CATHETERIZATION     Jan 2013  . KNEE ARTHROSCOPY Right   . MITRAL VALVE REPAIR  10/15/2011   Procedure: MINIMALLY INVASIVE MITRAL VALVE REPAIR (MVR);  Surgeon: Rexene Alberts, MD;  Location: [facility];  Service: Open Heart Surgery;  Laterality: Right;  . TRANSESOPHAGEAL ECHOCARDIOGRAM  7/12  . US ECHOCARDIOGRAPHY  6/12  . VENTRICULOPERITONEAL SHUNT  1971    OB History    No data available       Home Medications    Prior to Admission medications   Medication Sig Start Date End Date Taking? Authorizing Provider  aspirin EC 81 MG tablet Take 1 tablet (81 mg total) by mouth daily. 03/03/12   Larey Dresser, MD  atorvastatin (LIPITOR) 10 MG tablet TAKE 1 TABLET (10 MG TOTAL) BY MOUTH DAILY. 12/20/16   Martinique, Betty G, MD  clonazePAM (KLONOPIN) 0.25 MG disintegrating tablet TAKE 1 TABLET BY MOUTH THREE TIMES A DAY AS NEEDED FOR SEIZURE 01/09/17   Tomi Likens, Adam R, DO  denosumab (PROLIA)  60 MG/ML SOLN injection Inject 60 mg into the skin every 6 (six) months. Administer in upper arm, thigh, or abdomen 09/30/13   Rowe Clack, MD  diclofenac sodium (VOLTAREN) 1 % GEL Apply 4 g topically 4 (four) times daily. 01/06/17   Martinique, Betty G, MD  escitalopram (LEXAPRO) 10 MG tablet Take 1 tablet (10 mg total) by mouth daily. 01/06/17   Martinique, Betty G, MD  linaclotide [facility]) 145 MCG CAPS capsule TAKE ONE CAPSULE BY MOUTH DAILY BEFORE BREAKFAST 01/06/17   Martinique, Betty G, MD  MYRBETRIQ 50 MG TB24 tablet Take 50 mg by mouth daily. 12/27/16   [provider]  polyethylene glycol powder  (GLYCOLAX/MIRALAX) powder Take 17 g by mouth daily. 05/14/13   Rowe Clack, MD  vitamin B-12 (CYANOCOBALAMIN) 1000 MCG tablet Take 1,000 mcg by mouth daily.    [provider]    Family History Family History  Problem Relation Age of Onset  . Coronary artery disease Father 55       died MI  . Hyperlipidemia Mother   . Heart attack Maternal Grandfather     Social History Social History  Substance Use Topics  . Smoking status: Former Smoker    Quit date: 09/09/1990  . Smokeless tobacco: Never Used  . Alcohol use 0.0 oz/week     Comment: rarely     Allergies   Compazine   Review of Systems Review of Systems  Constitutional: Negative for appetite change, diaphoresis, fatigue, fever and unexpected weight change.  HENT: Positive for facial swelling. Negative for mouth sores.   Eyes: Negative for visual disturbance.  Respiratory: Negative for cough, chest tightness, shortness of breath and wheezing.   Cardiovascular: Negative for chest pain.  Gastrointestinal: Negative for abdominal pain, constipation, diarrhea, nausea and vomiting.  Endocrine: Negative for polydipsia, polyphagia and polyuria.  Genitourinary: Negative for dysuria, frequency, hematuria and urgency.  Musculoskeletal: Negative for back pain and neck stiffness.  Skin: Positive for wound. Negative for rash.  Allergic/Immunologic: Negative for immunocompromised state.  Neurological: Negative for syncope, light-headedness and headaches.  Hematological: Does not bruise/bleed easily.  Psychiatric/Behavioral: Negative for sleep disturbance. The patient is not nervous/anxious.   All other systems reviewed and are negative.    Physical Exam Updated Vital Signs BP 102/74 (BP Location: Right Arm)   Pulse 84   Temp 98.9 F (37.2 C) (Oral)   Resp 14   Ht 4\' 8"  (1.422 m)   Wt 34.9 kg (77 lb)   SpO2 98%   BMI 17.26 kg/m   Physical Exam  Constitutional: She appears well-developed and well-nourished.  No distress.  Awake, alert, nontoxic appearance  HENT:  Head: Normocephalic. Head is with contusion.  Mouth/Throat: Oropharynx is clear and moist. No oropharyngeal exudate.  Contusion over the left eye 3.1cm laceration to the left eyebrow TTP along the upper lateral orbital rim, no deformity palpated  Eyes: Conjunctivae are normal. Pupils are equal, round, and reactive to light. No scleral icterus. Right eye exhibits normal extraocular motion. Left eye exhibits abnormal extraocular motion.  Decreased EOM of the left eye.  Pt and family state this is baseline.  Pt denies diplopia with EOMs.  Neck: Normal range of motion. Neck supple.  Cardiovascular: Normal rate, regular rhythm and intact distal pulses.   Pulmonary/Chest: Effort normal and breath sounds normal. No respiratory distress. She has no wheezes.  Equal chest expansion  Abdominal: Soft. Bowel sounds are normal. She exhibits no mass. There is no tenderness. There is no  rebound and no guarding.  Musculoskeletal: Normal range of motion. She exhibits no edema.  Neurological: She is alert. GCS eye subscore is 4. GCS verbal subscore is 5. GCS motor subscore is 6.  Speech is clear and goal oriented 5/5 strength in the BUE including grip strength 4/5 strength in the BLE at baseline for patient Sensation to gross normal touch intact in all extremities  Skin: Skin is warm and dry. She is not diaphoretic.  Psychiatric: She has a normal mood and affect.  Nursing note and vitals reviewed.    ED Treatments / Results  Labs (all labs ordered are listed, but only abnormal results are displayed) Labs Reviewed - No data to display   Radiology Ct Head Wo Contrast  Result Date: 03/03/2017 CLINICAL DATA:  [place] from wheelchair, LEFT eye injury. Remote history of astrocytoma and ventriculoperitoneal shunt. EXAM: CT HEAD WITHOUT CONTRAST CT MAXILLOFACIAL WITHOUT CONTRAST TECHNIQUE: Multidetector CT imaging of the head and maxillofacial structures  were performed using the standard protocol without intravenous contrast. Multiplanar CT image reconstructions of the maxillofacial structures were also generated. COMPARISON:  CT HEAD November 25, 2011 FINDINGS: CT HEAD FINDINGS BRAIN: No intraparenchymal hemorrhage, mass effect nor midline shift. Severe cerebellar encephalomalacia, midline posterior fossa metallic clip. 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 Ventriculoperitoneal shunt via RIGHT parietal burr hole, distal tip in RIGHT lateral ventricle which is similarly decompressed. No hydrocephalus. LEFT parietal encephalomalacia. Moderate similar frontoparietal symmetric atrophy most compatible with post treatment change. No acute large vascular territory infarct. No abnormal extra-axial fluid collections. Basal cisterns are patent. VASCULAR: Mild calcific atherosclerosis of the carotid siphons. SKULL: No skull fracture. Status post suboccipital decompressive craniectomy. No significant scalp soft tissue swelling. OTHER: None. CT MAXILLOFACIAL FINDINGS OSSEOUS: The mandible is intact, the condyles are located. No acute facial fracture. No destructive bony lesions. ORBITS: Ocular globes intact. Lenses are located. No postseptal hematoma. Normal appearance the extraocular muscles and optic nerve sheath complex. SINUSES: Paranasal sinuses are well aerated. Nasal septum deviated to the LEFT. Included mastoid aircells are well aerated. SOFT TISSUES: LEFT facial and periorbital soft tissue swelling with subcutaneous gas. Tiny bubble of potentially postseptal gas superficial to anterior chamber of the globe. No radiopaque foreign bodies. IMPRESSION: CT HEAD: No acute intracranial process. Stable examination including post treatment changes of the cerebellum with severe atrophy. Stable ventriculoperitoneal shunt without hydrocephalus . CT MAXILLOFACIAL: No acute facial fracture. LEFT facial and periorbital soft tissue swelling/contusion with laceration.  Possible LEFT postseptal gas concerning for orbital injury. Recommend direct inspection. Electronically Signed   By: Elon Alas M.D.   On: 03/03/2017 01:00   Ct Maxillofacial Wo Contrast  Result Date: 03/03/2017 CLINICAL DATA:  [place] from wheelchair, LEFT eye injury. Remote history of astrocytoma and ventriculoperitoneal shunt. EXAM: CT HEAD WITHOUT CONTRAST CT MAXILLOFACIAL WITHOUT CONTRAST TECHNIQUE: Multidetector CT imaging of the head and maxillofacial structures were performed using the standard protocol without intravenous contrast. Multiplanar CT image reconstructions of the maxillofacial structures were also generated. COMPARISON:  CT HEAD November 25, 2011 FINDINGS: CT HEAD FINDINGS BRAIN: No intraparenchymal hemorrhage, mass effect nor midline shift. Severe cerebellar encephalomalacia, midline posterior fossa metallic clip. 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 Ventriculoperitoneal shunt via RIGHT parietal burr hole, distal tip in RIGHT lateral ventricle which is similarly decompressed. No hydrocephalus. LEFT parietal encephalomalacia. Moderate similar frontoparietal symmetric atrophy most compatible with post treatment change. No acute large vascular territory infarct. No abnormal extra-axial fluid collections. Basal cisterns are patent. VASCULAR: Mild calcific atherosclerosis  of the carotid siphons. SKULL: No skull fracture. Status post suboccipital decompressive craniectomy. No significant scalp soft tissue swelling. OTHER: None. CT MAXILLOFACIAL FINDINGS OSSEOUS: The mandible is intact, the condyles are located. No acute facial fracture. No destructive bony lesions. ORBITS: Ocular globes intact. Lenses are located. No postseptal hematoma. Normal appearance the extraocular muscles and optic nerve sheath complex. SINUSES: Paranasal sinuses are well aerated. Nasal septum deviated to the LEFT. Included mastoid aircells are well aerated. SOFT TISSUES: LEFT facial and periorbital  soft tissue swelling with subcutaneous gas. Tiny bubble of potentially postseptal gas superficial to anterior chamber of the globe. No radiopaque foreign bodies. IMPRESSION: CT HEAD: No acute intracranial process. Stable examination including post treatment changes of the cerebellum with severe atrophy. Stable ventriculoperitoneal shunt without hydrocephalus . CT MAXILLOFACIAL: No acute facial fracture. LEFT facial and periorbital soft tissue swelling/contusion with laceration. Possible LEFT postseptal gas concerning for orbital injury. Recommend direct inspection. Electronically Signed   By: Elon Alas M.D.   On: 03/03/2017 01:00    Procedures .[place]land KitchenLaceration Repair Date/Time: 03/03/2017 12:27 AM Performed by: Abigail Butts Authorized by: Abigail Butts   Consent:    Consent obtained:  Verbal   Consent given by:  Patient   Risks discussed:  Infection, pain, poor cosmetic result and poor wound healing   Alternatives discussed:  No treatment Anesthesia (see MAR for exact dosages):    Anesthesia method:  Local infiltration   Local anesthetic:  Lidocaine 2% WITH epi Laceration details:    Location:  Face   Face location:  L eyebrow   Length (cm):  3.1 Repair type:    Repair type:  Simple Pre-procedure details:    Preparation:  Patient was prepped and draped in usual sterile fashion Exploration:    Hemostasis achieved with:  Epinephrine and direct pressure   Wound exploration: entire depth of wound probed and visualized   Treatment:    Area cleansed with:  Saline   Amount of cleaning:  Standard   Irrigation solution:  Sterile water   Irrigation volume:  250   Irrigation method:  Syringe Skin repair:    Repair method:  Sutures   Suture size:  6-0   Suture material:  Prolene   Suture technique:  Simple interrupted   Number of sutures:  4 Approximation:    Approximation:  Close   [place] border: well-aligned   Post-procedure details:    Dressing:  Open (no  dressing)   Patient tolerance of procedure:  Tolerated well, no immediate complications    (including critical care time)  Medications Ordered in ED Medications  lidocaine-EPINEPHrine (XYLOCAINE W/EPI) 2 %-1:200000 (PF) injection 20 mL (20 mLs Infiltration Given by Other 03/02/17 2317)     Initial Impression / Assessment and Plan / ED Course  I have reviewed the triage vital signs and the nursing notes.  Pertinent labs & imaging results that were available during my care of the patient were reviewed by me and considered in my medical decision making (see chart for details).     Patient remained alert and oriented throughout her time here in the emergency department. No confusion. No loss of consciousness. She is not taking a blood thinner.  CT scan shows possible left post septal gas concerning for potential orbital injury.  Difficult EOM exam as patient's EOMs are decreased at baseline however without diplopia and doubt entrapment. No depressed orbital fracture. Patient will follow with her ophthalmologist Dr. Nicki Reaper.    Pressure irrigation performed. Wound explored and base  of wound visualized in a bloodless field without evidence of foreign body.  Laceration occurred < 8 hours prior to repair which was well tolerated. Tdap UTD.  Pt has no comorbidities to effect normal wound healing. Pt discharged without antibiotics.  Discussed suture home care with patient and answered questions. Pt to follow-up for wound check and suture removal in 5-7 days; they are to return to the ED sooner for signs of infection. Pt is hemodynamically stable with no complaints prior to dc.   Imaging and wound care discussed with patient and her sister at bedside. They state understanding and are in agreement with the plan. Also discussed reasons to return to the emergency department.  Final Clinical Impressions(s) / ED Diagnoses   Final diagnoses:  Contusion of face, initial encounter  Facial laceration, initial  encounter  Fall, initial encounter    New Prescriptions Discharge Medication List as of 03/03/2017  1:15 AM       Elliotte Marsalis, Jarrett Soho, PA-C 03/03/17 1540    Lacretia Leigh, MD 03/04/17 1338

## 2017-03-03 DIAGNOSIS — S0592XA Unspecified injury of left eye and orbit, initial encounter: Secondary | ICD-10-CM | POA: Diagnosis not present

## 2017-03-03 DIAGNOSIS — S0990XA Unspecified injury of head, initial encounter: Secondary | ICD-10-CM | POA: Diagnosis not present

## 2017-03-03 NOTE — Discharge Instructions (Signed)

## 2017-03-10 ENCOUNTER — Encounter: Payer: Self-pay | Admitting: Family Medicine

## 2017-03-10 ENCOUNTER — Ambulatory Visit (INDEPENDENT_AMBULATORY_CARE_PROVIDER_SITE_OTHER): Payer: Medicare Other | Admitting: Family Medicine

## 2017-03-10 VITALS — BP 120/80 | HR 78 | Resp 12 | Ht <= 58 in

## 2017-03-10 DIAGNOSIS — S01112D Laceration without foreign body of left eyelid and periocular area, subsequent encounter: Secondary | ICD-10-CM | POA: Diagnosis not present

## 2017-03-10 DIAGNOSIS — W07XXXD Fall from chair, subsequent encounter: Secondary | ICD-10-CM

## 2017-03-10 DIAGNOSIS — S0512XD Contusion of eyeball and orbital tissues, left eye, subsequent encounter: Secondary | ICD-10-CM | POA: Diagnosis not present

## 2017-03-10 MED ORDER — MUPIROCIN 2 % EX OINT: 1.0000 "application " | TOPICAL_OINTMENT | Freq: Two times a day (BID) | CUTANEOUS | 0 refills | Status: AC

## 2017-03-10 NOTE — Progress Notes (Signed)
HPI:   Michaela [name] is a 61 y.o. female, who is here today with her sister to follow on recent ER visit.   She was seen on 03/02/17 in the ER a few hours after accidental fall, laceration to left eyebrow, repair with 4 stitches.  She was recommended to follow with ophthalmologist, Dr. Nicki Reaper, because some abnormalities seen on head CT but She has not arranged appt. She was at her sister's house, felt when she was trying to transfer from her wheel chair to another chair (roller chair), landed with face on stone floor. She denies LOC or MS changes.   On 03/03/17:  CT HEAD: No acute intracranial process. Stable examination including post treatment changes of the cerebellum with severe atrophy. Stable ventriculoperitoneal shunt without hydrocephalus .   CT MAXILLOFACIAL: No acute facial fracture.Left facial and periorbital soft tissue swelling/contusion with laceration. Possible LEFT postseptal gas concerning for orbital injury. Recommend direct inspection.   She took Ibuprofen the first night after injury but has not had pain since then. She denies visual changes,nausea,vomiting, or headache.  This is her second fall in 3 months. She completed PT and still doing exercises.   Review of Systems  Constitutional: Positive for fatigue (no ore than usual). Negative for activity change, appetite change and fever.  HENT: Positive for congestion. Negative for ear pain, facial swelling, nosebleeds, sinus pain, sneezing, sore throat and trouble swallowing.   Eyes: Negative for photophobia, pain, redness and visual disturbance.  Respiratory: Negative for shortness of breath and wheezing.   Gastrointestinal: Negative for abdominal pain, nausea and vomiting.  Neurological: Negative for weakness and headaches.      Current Outpatient Prescriptions on File Prior to Visit  Medication Sig Dispense Refill  . aspirin EC 81 MG tablet Take 1 tablet (81 mg total) by mouth daily. 90  tablet 3  . atorvastatin (LIPITOR) 10 MG tablet TAKE 1 TABLET (10 MG TOTAL) BY MOUTH DAILY. 90 tablet 0  . clonazePAM (KLONOPIN) 0.25 MG disintegrating tablet TAKE 1 TABLET BY MOUTH THREE TIMES A DAY AS NEEDED FOR SEIZURE 30 tablet 2  . denosumab (PROLIA) 60 MG/ML SOLN injection Inject 60 mg into the skin every 6 (six) months. Administer in upper arm, thigh, or abdomen 1 Syringe 0  . diclofenac sodium (VOLTAREN) 1 % GEL Apply 4 g topically 4 (four) times daily. 4 Tube 3  . escitalopram (LEXAPRO) 10 MG tablet Take 1 tablet (10 mg total) by mouth daily. 90 tablet 1  . linaclotide (LINZESS) 145 MCG CAPS capsule TAKE ONE CAPSULE BY MOUTH DAILY BEFORE BREAKFAST 90 capsule 2  . MYRBETRIQ 50 MG TB24 tablet Take 50 mg by mouth daily.    . polyethylene glycol powder (GLYCOLAX/MIRALAX) powder Take 17 g by mouth daily. 3350 g 1  . vitamin B-12 (CYANOCOBALAMIN) 1000 MCG tablet Take 1,000 mcg by mouth daily.    . [DISCONTINUED] imipramine (TOFRANIL) 25 MG tablet Take 25 mg by mouth at bedtime.     . [DISCONTINUED] metoprolol tartrate (LOPRESSOR) 12.5 mg TABS Take 12.5 mg by mouth 2 (two) times daily.    . [DISCONTINUED] polysaccharide iron (NIFEREX) 150 MG CAPS capsule Take 1 capsule (150 mg total) by mouth daily. 30 each 0  . [DISCONTINUED] pravastatin (PRAVACHOL) 80 MG tablet Take 80 mg by mouth daily.     No current facility-administered medications on file prior to visit.      Past Medical History:  Diagnosis Date  . Astrocytoma brain tumor ([place])  1971   astrocytoma treated with radiation  . C. difficile colitis 03/8937   complicated by sepsis/ARDS and prolonged hosp  . Depression   . Hyperlipidemia   . Mitral regurgitation 10/2011   severe, s/p min invasive repair and annuloplasty  . Mitral valve prolapse   . Neuromuscular disorder ([facility])    numbness in hands and feet  . Osteoarthritis   . Osteoporosis    DEXA @ [facility] 11/2012: -3.5, prior tx Reclast, last in 2011, rec change to Prolia  .  Overactive bladder   . Pleural effusion, left 11/11/2011  . Vitamin D deficiency    Allergies  Allergen Reactions  . Compazine Other (See Comments)    "eyes get buggy"    Social History   Social History  . Marital status: Single    Spouse name: N/A  . Number of children: 0  . Years of education: N/A   Occupational History  . disabled    Social History Main Topics  . Smoking status: Former Smoker    Quit date: 09/09/1990  . Smokeless tobacco: Never Used  . Alcohol use 0.0 oz/week     Comment: rarely  . Drug use: No  . Sexual activity: Not Asked   Other Topics Concern  . None   Social History Narrative  . None    Vitals:   03/10/17 1200  BP: 120/80  Pulse: 78  Resp: 12   There is no height or weight on file to calculate BMI.   Physical Exam  Nursing note and vitals reviewed. Constitutional: She is oriented to person, place, and time. She appears well-developed. No distress.  HENT:  Head: Atraumatic.    Mouth/Throat: Oropharynx is clear and moist and mucous membranes are normal.  No tenderness upon palpation of periorbital bone structures, except for tenderness upon palpation of laceration. No crepitus or bone deformities.  Eyes: Conjunctivae are normal. Left eye exhibits nystagmus.  EOM at her baseline.  Cardiovascular: Normal rate and regular rhythm.   Respiratory: Effort normal and breath sounds normal. No respiratory distress.  Lymphadenopathy:    She has no cervical adenopathy.  Neurological: She is alert and oriented to person, place, and time. She displays tremor.  In her wheel chair.  Skin: Skin is warm. Ecchymosis noted. No erythema.  Linear laceration, healing well, parallel to left eyebrow. No erythema or edema. Sutures under clear crust. Periocular ecchymotic lesions at different stages. See head graphic for detail.  Psychiatric: Her speech is normal. Her mood appears anxious.  Well groomed, good eye contact.    ASSESSMENT AND  PLAN:   Michaela [name] was seen today for suture / staple removal.  Diagnoses and all orders for this visit:  Accidental fall from chair, subsequent encounter  We discussed fall risks and how to prevent them. She has completed PT, some of her meds could increase risk of falls.  Eyebrow laceration, left, subsequent encounter  4 stitches removed, she tolerated procedure well. Recommend avoid direct sun light on ecchymotic areas. Gentile massage on laceration with mupirocin oint combine with sun screening. F/U as needed.  Traumatic contusion of left periorbital region, subsequent encounter  We discussed CT findings and the concern about left orbital injury. She denies pain or visual changes, ocular movement at her baseline. Strongly recommended arranging appt with Dr Nicki Reaper as instructed at ER discharge. She an her sister voice understanding.  -     mupirocin ointment (BACTROBAN) 2 %; Place 1 application into the nose 2 (two) times daily.  18 min face to face OV. > 50% was dedicated to counseling about fall prevention, wound care,and discussion of CT findings. She still would like to be independent, recommend avoiding risky activities that could increase the risk of injuries/falls.     Betty G. Martinique, MD  [facility]. [place] office.

## 2017-03-10 NOTE — Patient Instructions (Signed)
A few things to remember from today's visit:   Accidental fall from chair, subsequent encounter  Eyebrow laceration, left, subsequent encounter  Fall Prevention in the Home Falls can cause injuries and can affect people from all age groups. There are many simple things that you can do to make your home safe and to help prevent falls. What can I do on the outside of my home?  Regularly repair the edges of walkways and driveways and fix any cracks.  Remove high doorway thresholds.  Trim any shrubbery on the main path into your home.  Use bright outdoor lighting.  Clear walkways of debris and clutter, including tools and rocks.  Regularly check that handrails are securely fastened and in good repair. Both sides of any steps should have handrails.  Install guardrails along the edges of any raised decks or porches.  Have leaves, snow, and ice cleared regularly.  Use sand or salt on walkways during winter months.  In the garage, clean up any spills right away, including grease or oil spills. What can I do in the bathroom?  Use night lights.  Install grab bars by the toilet and in the tub and shower. Do not use towel bars as grab bars.  Use non-skid mats or decals on the floor of the tub or shower.  If you need to sit down while you are in the shower, use a plastic, non-slip stool.  Keep the floor dry. Immediately clean up any water that spills on the floor.  Remove soap buildup in the tub or shower on a regular basis.  Attach bath mats securely with double-sided non-slip rug tape.  Remove throw rugs and other tripping hazards from the floor. What can I do in the bedroom?  Use night lights.  Make sure that a bedside light is easy to reach.  Do not use oversized bedding that drapes onto the floor.  Have a firm chair that has side arms to use for getting dressed.  Remove throw rugs and other tripping hazards from the floor. What can I do in the kitchen?  Clean up  any spills right away.  Avoid walking on wet floors.  Place frequently used items in easy-to-reach places.  If you need to reach for something above you, use a sturdy step stool that has a grab bar.  Keep electrical cables out of the way.  Do not use floor polish or wax that makes floors slippery. If you have to use wax, make sure that it is non-skid floor wax.  Remove throw rugs and other tripping hazards from the floor. What can I do in the stairways?  Do not leave any items on the stairs.  Make sure that there are handrails on both sides of the stairs. Fix handrails that are broken or loose. Make sure that handrails are as long as the stairways.  Check any carpeting to make sure that it is firmly attached to the stairs. Fix any carpet that is loose or worn.  Avoid having throw rugs at the top or bottom of stairways, or secure the rugs with carpet tape to prevent them from moving.  Make sure that you have a light switch at the top of the stairs and the bottom of the stairs. If you do not have them, have them installed. What are some other fall prevention tips?  Wear closed-toe shoes that fit well and support your feet. Wear shoes that have rubber soles or low heels.  When you use a stepladder,  make sure that it is completely opened and that the sides are firmly locked. Have someone hold the ladder while you are using it. Do not climb a closed stepladder.  Add color or contrast paint or tape to grab bars and handrails in your home. Place contrasting color strips on the first and last steps.  Use mobility aids as needed, such as canes, walkers, scooters, and crutches.  Turn on lights if it is dark. Replace any light bulbs that burn out.  Set up furniture so that there are clear paths. Keep the furniture in the same spot.  Fix any uneven floor surfaces.  Choose a carpet design that does not hide the edge of steps of a stairway.  Be aware of any and all pets.  Review your  medicines with your healthcare provider. Some medicines can cause dizziness or changes in blood pressure, which increase your risk of falling. Talk with your health care provider about other ways that you can decrease your risk of falls. This may include working with a physical therapist or trainer to improve your strength, balance, and endurance. This information is not intended to replace advice given to you by your health care provider. Make sure you discuss any questions you have with your health care provider. Document Released: 08/16/2002 Document Revised: 01/23/2016 Document Reviewed: 09/30/2014 Elsevier Interactive Patient Education  2017 [place].  Please be sure medication list is accurate. If a new problem present, please set up appointment sooner than planned today.

## 2017-04-10 ENCOUNTER — Telehealth: Payer: Self-pay | Admitting: General Practice

## 2017-04-10 NOTE — Telephone Encounter (Signed)
LMOVM to call the office.  Prolia injection is due.  Reaching out to patient to find out if she wants to proceed.  Pt has a $250.00 Co-pay and pt was going to reach out to the [employer] for co-pay assistance. Going to archive for now.

## 2017-04-16 ENCOUNTER — Ambulatory Visit: Payer: Medicare Other | Admitting: Neurology

## 2017-04-28 ENCOUNTER — Other Ambulatory Visit: Payer: Self-pay | Admitting: Neurology

## 2017-05-19 ENCOUNTER — Other Ambulatory Visit: Payer: Self-pay | Admitting: Family Medicine

## 2017-05-19 ENCOUNTER — Other Ambulatory Visit: Payer: Self-pay | Admitting: Neurology

## 2017-05-29 ENCOUNTER — Encounter: Payer: Self-pay | Admitting: Family Medicine

## 2017-06-02 ENCOUNTER — Ambulatory Visit: Payer: Medicare Other | Admitting: Family Medicine

## 2017-06-05 ENCOUNTER — Other Ambulatory Visit: Payer: Self-pay | Admitting: Family Medicine

## 2017-06-05 DIAGNOSIS — D499 Neoplasm of unspecified behavior of unspecified site: Secondary | ICD-10-CM

## 2017-06-23 ENCOUNTER — Ambulatory Visit
Admission: RE | Admit: 2017-06-23 | Discharge: 2017-06-23 | Disposition: A | Discharge status: Home / Self Care | Payer: No Typology Code available for payment source | Source: Ambulatory Visit | Attending: Family Medicine | Admitting: Family Medicine

## 2017-06-23 DIAGNOSIS — D499 Neoplasm of unspecified behavior of unspecified site: Secondary | ICD-10-CM

## 2017-07-07 ENCOUNTER — Ambulatory Visit: Payer: Medicare Other | Admitting: Neurology

## 2017-08-12 NOTE — Telephone Encounter (Signed)
error 

## 2017-10-01 ENCOUNTER — Ambulatory Visit: Payer: Medicare Other | Admitting: Neurology

## 2017-11-24 ENCOUNTER — Ambulatory Visit (INDEPENDENT_AMBULATORY_CARE_PROVIDER_SITE_OTHER): Payer: Medicare (Managed Care) | Admitting: Neurology

## 2017-11-24 ENCOUNTER — Encounter: Payer: Self-pay | Admitting: Neurology

## 2017-11-24 VITALS — BP 94/56 | HR 92

## 2017-11-24 DIAGNOSIS — G252 Other specified forms of tremor: Secondary | ICD-10-CM

## 2017-11-24 DIAGNOSIS — R270 Ataxia, unspecified: Secondary | ICD-10-CM | POA: Diagnosis not present

## 2017-11-24 MED ORDER — CLONAZEPAM 0.25 MG PO TBDP: ORAL_TABLET | ORAL | 3 refills | Status: DC

## 2017-11-24 NOTE — Patient Instructions (Signed)
1.  Take clonazepam 0.5mg  tablet.  Take 1/2 tablet in morning and 1 tablet at bedtime. 2.  Follow up in 3 months.

## 2017-11-24 NOTE — Progress Notes (Signed)
NEUROLOGY FOLLOW UP OFFICE NOTE  Michaela [name] [id number]  HISTORY OF PRESENT ILLNESS: Michaela [name] is a 62 year old  female with depression, anxiety, neurogenic bladder and history of cerebral astrocytoma who follows up for tremor and ataxia.  She is accompanied by her daughter who supplements history.   UPDATE: For tremor, she was started on clonazepam 0.25mg .  She is taking 0.125mg  at bedtime and occasionally 0.125mg  during the day.  At first, she was experiencing hallucinations but this subsided.  Her tremor seem to be worse in the mornings.  A couple of months ago, she was experiencing an occipital headache, which has since resolved.  HISTORY: She was diagnosed with a cerebellar astrocytoma as a child and underwent radiation and shunt placement at age 50.  She has residual spasticity, cerebellar dysfunction and neurogenic bladder.  She is nonambulatory at baseline.  However, she is able to transition herself from the wheelchair to the bathroom.  In 2013, she underwent mitral valve repair.  Since then, she reports that her shaking has gotten worse.  The shaking is episodic and usually occurs in the morning when she gets up.  Otherwise, she is unaware of any triggers.  Nothing seems to calm it, it just resolves spontaneously.  It caused her anxiety and she was started on Lexapro, which has helped the anxiety but not the shaking.  When it occurs getting out of bed, she has had falls.  Otherwise, she reports no new symptoms.   Most recent brain image available is a head CT from 11/25/11, which revealed white matter hyperintensities in the bilateral parietal lobes and right lateral ventricular shunt but no acute findings.  PAST MEDICAL HISTORY: Past Medical History:  Diagnosis Date  . Astrocytoma brain tumor ([place]) 1971   astrocytoma treated with radiation  . C. difficile colitis 10/6946   complicated by sepsis/ARDS and prolonged hosp  . Depression   . Hyperlipidemia   . Mitral  regurgitation 10/2011   severe, s/p min invasive repair and annuloplasty  . Mitral valve prolapse   . Neuromuscular disorder ([facility])    numbness in hands and feet  . Osteoarthritis   . Osteoporosis    DEXA @ [facility] 11/2012: -3.5, prior tx Reclast, last in 2011, rec change to Prolia  . Overactive bladder   . Pleural effusion, left 11/11/2011  . Vitamin D deficiency     MEDICATIONS: Current Outpatient Medications on File Prior to Visit  Medication Sig Dispense Refill  . aspirin EC 81 MG tablet Take 1 tablet (81 mg total) by mouth daily. 90 tablet 3  . atorvastatin (LIPITOR) 10 MG tablet TAKE ONE TABLET BY MOUTH DAILY 90 tablet 1  . denosumab (PROLIA) 60 MG/ML SOLN injection Inject 60 mg into the skin every 6 (six) months. Administer in upper arm, thigh, or abdomen 1 Syringe 0  . diclofenac sodium (VOLTAREN) 1 % GEL Apply 4 g topically 4 (four) times daily. 4 Tube 3  . escitalopram (LEXAPRO) 10 MG tablet Take 1 tablet (10 mg total) by mouth daily. 90 tablet 1  . linaclotide (LINZESS) 145 MCG CAPS capsule TAKE ONE CAPSULE BY MOUTH DAILY BEFORE BREAKFAST 90 capsule 2  . MYRBETRIQ 50 MG TB24 tablet Take 50 mg by mouth daily.    . polyethylene glycol powder (GLYCOLAX/MIRALAX) powder Take 17 g by mouth daily. 3350 g 1  . vitamin B-12 (CYANOCOBALAMIN) 1000 MCG tablet Take 1,000 mcg by mouth daily.    . [DISCONTINUED] imipramine (TOFRANIL) 25 MG tablet Take  25 mg by mouth at bedtime.     . [DISCONTINUED] metoprolol tartrate (LOPRESSOR) 12.5 mg TABS Take 12.5 mg by mouth 2 (two) times daily.    . [DISCONTINUED] polysaccharide iron (NIFEREX) 150 MG CAPS capsule Take 1 capsule (150 mg total) by mouth daily. 30 each 0  . [DISCONTINUED] pravastatin (PRAVACHOL) 80 MG tablet Take 80 mg by mouth daily.     No current facility-administered medications on file prior to visit.     ALLERGIES: Allergies  Allergen Reactions  . Compazine Other (See Comments)    "eyes get buggy"    FAMILY HISTORY: Family  History  Problem Relation Age of Onset  . Coronary artery disease Father 63       died MI  . Hyperlipidemia Mother   . Heart attack Maternal Grandfather     SOCIAL HISTORY: Social History   Socioeconomic History  . Marital status: Single    Spouse name: Not on file  . Number of children: 0  . Years of education: Not on file  . Highest education level: Not on file  Social Needs  . Financial resource strain: Not on file  . Food insecurity - worry: Not on file  . Food insecurity - inability: Not on file  . Transportation needs - medical: Not on file  . Transportation needs - non-medical: Not on file  Occupational History  . Occupation: disabled  Tobacco Use  . Smoking status: Former Smoker    Last attempt to quit: 09/09/1990    Years since quitting: 27.2  . Smokeless tobacco: Never Used  Substance and Sexual Activity  . Alcohol use: Yes    Alcohol/week: 0.0 oz    Comment: rarely  . Drug use: No  . Sexual activity: Not on file  Other Topics Concern  . Not on file  Social History Narrative  . Not on file    REVIEW OF SYSTEMS: Constitutional: No fevers, chills, or sweats, no generalized fatigue, change in appetite Eyes: No visual changes, double vision, eye pain Ear, nose and throat: No hearing loss, ear pain, nasal congestion, sore throat Cardiovascular: No chest pain, palpitations Respiratory:  No shortness of breath at rest or with exertion, wheezes GastrointestinaI: No nausea, vomiting, diarrhea, abdominal pain, fecal incontinence Genitourinary:  No dysuria, urinary retention or frequency Musculoskeletal:  No neck pain, back pain Integumentary: No rash, pruritus, skin lesions Neurological: as above Psychiatric: No depression, insomnia, anxiety Endocrine: No palpitations, fatigue, diaphoresis, mood swings, change in appetite, change in weight, increased thirst Hematologic/Lymphatic:  No purpura, petechiae. Allergic/Immunologic: no itchy/runny eyes, nasal congestion,  recent allergic reactions, rashes  PHYSICAL EXAM: Vitals:   11/24/17 0940  BP: (!) 94/56  Pulse: 92  SpO2: 96%   General: No acute distress.   Head:  Normocephalic/atraumatic Eyes:  Fundi examined but not visualized Neck: supple, no paraspinal tenderness, full range of motion Heart:  Regular rate and rhythm Lungs:  Clear to auscultation bilaterally Back: No paraspinal tenderness Neurological Exam: Mental status: alert and oriented to person, place, and time, recent and remote memory intact, fund of knowledge intact, attention and concentration intact, speech fluent and not dysarthric, language intact. Cranial nerves:  Left eye adducted on primary gaze, unable to abduct left eye, saccadic eye movements with tracking nystagmus.  Otherwise, CN II-XII intact.  Bulk & Tone: decreased bulk, increased tone, no fasciculations.  Motor:  5/5 throughout.  Sensation: Light touch sensation intact.  Deep Tendon Reflexes:  2+ throughout. Finger to nose testing:  Dysmetria bilaterally.  Gait:  Nonambulatory.  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  PLAN: 1.  We will increase clonazepam to 0.125mg  in AM and 0.25mg  at bedtime.  We can further increase dose as needed and tolerated. 2.  Follow up in 3 months.  25 minutes spent face to face with patient, over 50% spent discussing management.  Metta Clines, DO  CC:  Betty Martinique, MD

## 2017-12-09 ENCOUNTER — Ambulatory Visit: Payer: Medicare Other | Admitting: Neurology

## 2017-12-09 DIAGNOSIS — R2681 Unsteadiness on feet: Secondary | ICD-10-CM | POA: Diagnosis not present

## 2017-12-09 DIAGNOSIS — R488 Other symbolic dysfunctions: Secondary | ICD-10-CM | POA: Diagnosis not present

## 2017-12-09 DIAGNOSIS — G252 Other specified forms of tremor: Secondary | ICD-10-CM | POA: Diagnosis not present

## 2017-12-09 DIAGNOSIS — Z9181 History of falling: Secondary | ICD-10-CM | POA: Diagnosis not present

## 2017-12-09 DIAGNOSIS — Z85841 Personal history of malignant neoplasm of brain: Secondary | ICD-10-CM | POA: Diagnosis not present

## 2017-12-09 DIAGNOSIS — R278 Other lack of coordination: Secondary | ICD-10-CM | POA: Diagnosis not present

## 2017-12-10 DIAGNOSIS — G252 Other specified forms of tremor: Secondary | ICD-10-CM | POA: Diagnosis not present

## 2017-12-10 DIAGNOSIS — R488 Other symbolic dysfunctions: Secondary | ICD-10-CM | POA: Diagnosis not present

## 2017-12-10 DIAGNOSIS — Z85841 Personal history of malignant neoplasm of brain: Secondary | ICD-10-CM | POA: Diagnosis not present

## 2017-12-10 DIAGNOSIS — R2681 Unsteadiness on feet: Secondary | ICD-10-CM | POA: Diagnosis not present

## 2017-12-10 DIAGNOSIS — R278 Other lack of coordination: Secondary | ICD-10-CM | POA: Diagnosis not present

## 2017-12-10 DIAGNOSIS — Z9181 History of falling: Secondary | ICD-10-CM | POA: Diagnosis not present

## 2017-12-11 ENCOUNTER — Non-Acute Institutional Stay (SKILLED_NURSING_FACILITY): Payer: Medicare Other | Admitting: Adult Health

## 2017-12-11 ENCOUNTER — Encounter: Payer: Self-pay | Admitting: Neurology

## 2017-12-11 ENCOUNTER — Encounter: Payer: Self-pay | Admitting: Adult Health

## 2017-12-11 DIAGNOSIS — F339 Major depressive disorder, recurrent, unspecified: Secondary | ICD-10-CM

## 2017-12-11 DIAGNOSIS — N3281 Overactive bladder: Secondary | ICD-10-CM | POA: Diagnosis not present

## 2017-12-11 DIAGNOSIS — R488 Other symbolic dysfunctions: Secondary | ICD-10-CM | POA: Diagnosis not present

## 2017-12-11 DIAGNOSIS — F419 Anxiety disorder, unspecified: Secondary | ICD-10-CM

## 2017-12-11 DIAGNOSIS — R05 Cough: Secondary | ICD-10-CM | POA: Diagnosis not present

## 2017-12-11 DIAGNOSIS — K5909 Other constipation: Secondary | ICD-10-CM | POA: Diagnosis not present

## 2017-12-11 DIAGNOSIS — E785 Hyperlipidemia, unspecified: Secondary | ICD-10-CM

## 2017-12-11 DIAGNOSIS — G252 Other specified forms of tremor: Secondary | ICD-10-CM | POA: Diagnosis not present

## 2017-12-11 DIAGNOSIS — R2681 Unsteadiness on feet: Secondary | ICD-10-CM | POA: Diagnosis not present

## 2017-12-11 DIAGNOSIS — R278 Other lack of coordination: Secondary | ICD-10-CM | POA: Diagnosis not present

## 2017-12-11 DIAGNOSIS — R059 Cough, unspecified: Secondary | ICD-10-CM

## 2017-12-11 DIAGNOSIS — Z85841 Personal history of malignant neoplasm of brain: Secondary | ICD-10-CM | POA: Diagnosis not present

## 2017-12-11 DIAGNOSIS — Z9181 History of falling: Secondary | ICD-10-CM | POA: Diagnosis not present

## 2017-12-11 DIAGNOSIS — M810 Age-related osteoporosis without current pathological fracture: Secondary | ICD-10-CM | POA: Diagnosis not present

## 2017-12-11 NOTE — Progress Notes (Signed)
Location:  [facility] Room Number: 104-A Place of Service:  SNF (31) Provider:  Durenda Age, NP  Patient Care Team: Martinique, Betty G, MD as PCP - General (Family Medicine) Larey Dresser, MD as Referring Physician (Cardiology) Larey Dresser, MD (Cardiology)  Extended Emergency Contact Information Primary Emergency Contact: [name], [place] [address] [place] Phone: [phone] Relation: Sister Secondary Emergency Contact: Moore,Mickey Address: [address]          [place], [place] [address] [name] Phone: [phone] Mobile Phone: [phone] Relation: Mother  Code Status:  Full Code  Goals of care: Advanced Directive information Advanced Directives 05/09/2016  Does Patient Have a Medical Advance Directive? Yes  Type of Advance Directive [place]  Would patient like information on creating a medical advance directive? -  Pre-existing out of facility DNR order (yellow form or pink MOST form) -     Chief Complaint  Patient presents with  . Medical Management of Chronic Issues    Routine [facility] visit    HPI:  Pt is a 62 y.o. female seen today for medical management of chronic diseases.  She is a long-term care resident of [facility].  She has a PMH of astrocytoma brain tumor, mitral regurgitation, mitral valve prolapse, OA, and osteoporosis. She was seen in her room today. She was complaining of productive cough with whitish phlegm. Breath sounds clear. No wheezing. She said that she has difficulty spitting out phlegm.    Past Medical History:  Diagnosis Date  . Astrocytoma brain tumor ([place]) 1971   astrocytoma treated with radiation  . C. difficile colitis 03/3219   complicated by sepsis/ARDS and prolonged hosp  . Depression   . Hyperlipidemia   . Mitral regurgitation 10/2011   severe, s/p min invasive repair and annuloplasty  . Mitral  valve prolapse   . Neuromuscular disorder (HCC)    numbness in hands and feet  . Osteoarthritis   . Osteoporosis    DEXA @ [facility] 11/2012: -3.5, prior tx Reclast, last in 2011, rec change to Prolia  . Overactive bladder   . Pleural effusion, left 11/11/2011  . Vitamin D deficiency    Past Surgical History:  Procedure Laterality Date  . CARDIAC CATHETERIZATION     Jan 2013  . KNEE ARTHROSCOPY Right   . MITRAL VALVE REPAIR  10/15/2011   Procedure: MINIMALLY INVASIVE MITRAL VALVE REPAIR (MVR);  Surgeon: Rexene Alberts, MD;  Location: [facility];  Service: Open Heart Surgery;  Laterality: Right;  . TRANSESOPHAGEAL ECHOCARDIOGRAM  7/12  . US ECHOCARDIOGRAPHY  6/12  . VENTRICULOPERITONEAL SHUNT  1971    Allergies  Allergen Reactions  . Compazine Other (See Comments)    "eyes get buggy"    Outpatient Encounter Medications as of 12/11/2017  Medication Sig  . atorvastatin (LIPITOR) 10 MG tablet TAKE ONE TABLET BY MOUTH DAILY  . Calcium Carbonate-Vitamin D (CALCIUM 600/VITAMIN D PO) Take 1 tablet by mouth daily. Calcium 600 - vitamin D3 200  . clonazePAM (KLONOPIN) 0.25 MG disintegrating tablet Take 1/2 tablet in AM and 1 tablet at bedtime.  Marland Kitchen denosumab (PROLIA) 60 MG/ML SOLN injection Inject 60 mg into the skin every 6 (six) months. Administer in upper arm, thigh, or abdomen  . escitalopram (LEXAPRO) 10 MG tablet Take 1 tablet (10 mg total) by mouth daily.  Marland Kitchen linaclotide (LINZESS) 145 MCG CAPS capsule  TAKE ONE CAPSULE BY MOUTH DAILY BEFORE BREAKFAST  . Menthol, Topical Analgesic, (BIOFREEZE) 4 % GEL Apply 1 application topically 2 (two) times daily. Apply topically BID to sore areas  . mirabegron ER (MYRBETRIQ) 25 MG TB24 tablet Take 25 mg by mouth 2 (two) times daily.  Marland Kitchen NUTRITIONAL SUPPLEMENT LIQD Take 120 mLs by mouth daily. [employer]  . polyvinyl alcohol (LIQUIFILM TEARS) 1.4 % ophthalmic solution Place 1 drop into both eyes 2 (two) times daily.  . White Petrolatum-Mineral Oil (LUBRIFRESH P.M. OP)  Apply 1 application to eye at bedtime. Apply to right eye  . [DISCONTINUED] aspirin EC 81 MG tablet Take 1 tablet (81 mg total) by mouth daily.  . [DISCONTINUED] diclofenac sodium (VOLTAREN) 1 % GEL Apply 4 g topically 4 (four) times daily.  . [DISCONTINUED] imipramine (TOFRANIL) 25 MG tablet Take 25 mg by mouth at bedtime.   . [DISCONTINUED] metoprolol tartrate (LOPRESSOR) 12.5 mg TABS Take 12.5 mg by mouth 2 (two) times daily.  . [DISCONTINUED] MYRBETRIQ 50 MG TB24 tablet Take 50 mg by mouth daily.  . [DISCONTINUED] polyethylene glycol powder (GLYCOLAX/MIRALAX) powder Take 17 g by mouth daily.  . [DISCONTINUED] polysaccharide iron (NIFEREX) 150 MG CAPS capsule Take 1 capsule (150 mg total) by mouth daily.  . [DISCONTINUED] pravastatin (PRAVACHOL) 80 MG tablet Take 80 mg by mouth daily.  . [DISCONTINUED] vitamin B-12 (CYANOCOBALAMIN) 1000 MCG tablet Take 1,000 mcg by mouth daily.   No facility-administered encounter medications on file as of 12/11/2017.     Review of Systems  GENERAL: No change in appetite, no fatigue MOUTH and THROAT: Denies oral discomfort, gingival pain or bleeding RESPIRATORY: no cough, SOB, DOE, wheezing, hemoptysis CARDIAC: No chest pain, edema or palpitations GI: No abdominal pain, diarrhea, constipation, heart burn, nausea or vomiting GU: Denies dysuria, frequency, hematuria or discharge PSYCHIATRIC: Denies feelings of depression or anxiety. No report of hallucinations, insomnia, paranoia, or agitation    Immunization History  Administered Date(s) Administered  . Influenza Split 08/11/2012  . Influenza,inj,Quad PF,6+ Mos 05/14/2013, 07/19/2014, 06/29/2015, 07/01/2016  . Influenza-Unspecified 06/03/2011  . Pneumococcal Polysaccharide-23 11/23/2011   Pertinent  Health Maintenance Due  Topic Date Due  . PAP SMEAR  12/02/1978  . MAMMOGRAM  06/20/2013  . COLONOSCOPY  07/27/2014  . INFLUENZA VACCINE  04/09/2018   Fall Risk  11/24/2017 06/29/2015 11/11/2012    Falls in the past year? Yes Yes No  Number falls in past yr: 2 or more 2 or more -  Injury with Fall? Yes Yes -  Comment - bruising  -  Risk Factor Category  High Fall Risk High Fall Risk -  Risk for fall due to : - Impaired balance/gait;Impaired mobility;History of fall(s) Impaired balance/gait;Impaired mobility  Follow up Falls evaluation completed - -      Vitals:   12/11/17 1536  BP: 103/64  Pulse: 81  Resp: 19  Temp: (!) 97.3 F (36.3 C)  TempSrc: Oral  SpO2: 98%  Weight: 90 lb 9.6 oz (41.1 kg)  Height: 4\' 11"  (1.499 m)   Body mass index is 18.3 kg/m.  Physical Exam  GENERAL APPEARANCE: In no acute distress.  MOUTH and THROAT: Lips are without lesions. Oral mucosa is moist and without lesions.  RESPIRATORY: Breathing is even & unlabored, BS CTAB CARDIAC: RRR, no murmur,no extra heart sounds, no edema GI: Abdomen soft, normal BS, no masses, no tenderness EXTREMITIES:  Able to move X 4 extremities, BLE weakmess PSYCHIATRIC: Alert and oriented X 3. Affect and behavior are  appropriate   Lab Results  Component Value Date   TSH 1.15 03/02/2015   Lab Results  Component Value Date   HGBA1C 5.5 10/11/2011   Lab Results  Component Value Date   CHOL 151 11/06/2015   HDL 38.10 (L) 11/06/2015   LDLCALC 93 11/06/2015   LDLDIRECT 165.0 05/14/2013   TRIG 99.0 11/06/2015   CHOLHDL 4 11/06/2015    Assessment/Plan  1. Cough - start Robitussin 100 mg/22ml give 10 ml Q 6AM, 2PM, and 10PM X 2 weeks, Albuterol 2.5 mg/5ml 1 neb Q 6AM, 2PM, and 10PM X 5 days, encourage use of incentive spirometer   2. Chronic constipation - continue Linzess 145 mcg 1 capsule daily   3. Hyperlipidemia, unspecified hyperlipidemia type - continue Atorvastatin 10 mg  daily   4. Osteoporosis, unspecified osteoporosis type, unspecified pathological fracture presence - continue Prolia 60 mg/ml SQ (next due date 02/20/18   5. Anxiety disorder, unspecified type - mood is stable, continue  Clonazepam 0.25 mg 1 tab Q HS   6. Depression, recurrent ([place]) - continue Escitalopram 10 mg 1 tab daily   7. OAB (overactive bladder) - continue Myrbetriq ER 25 mg 1 tab BID      Family/ staff Communication: Discussed plan of care with the resident.  Labs/tests ordered:  CBC, CMP, Lipid panel  Goals of care:   Long-term care  Durenda Age, NP [facility] [phone] (Monday-Friday 8:00 a.m. - 5:00 p.m.) [phone] (after hours)

## 2017-12-12 DIAGNOSIS — R2681 Unsteadiness on feet: Secondary | ICD-10-CM | POA: Diagnosis not present

## 2017-12-12 DIAGNOSIS — Z9181 History of falling: Secondary | ICD-10-CM | POA: Diagnosis not present

## 2017-12-12 DIAGNOSIS — D649 Anemia, unspecified: Secondary | ICD-10-CM | POA: Diagnosis not present

## 2017-12-12 DIAGNOSIS — G252 Other specified forms of tremor: Secondary | ICD-10-CM | POA: Diagnosis not present

## 2017-12-12 DIAGNOSIS — I10 Essential (primary) hypertension: Secondary | ICD-10-CM | POA: Diagnosis not present

## 2017-12-12 DIAGNOSIS — Z85841 Personal history of malignant neoplasm of brain: Secondary | ICD-10-CM | POA: Diagnosis not present

## 2017-12-12 DIAGNOSIS — R278 Other lack of coordination: Secondary | ICD-10-CM | POA: Diagnosis not present

## 2017-12-12 DIAGNOSIS — E785 Hyperlipidemia, unspecified: Secondary | ICD-10-CM | POA: Diagnosis not present

## 2017-12-12 DIAGNOSIS — R488 Other symbolic dysfunctions: Secondary | ICD-10-CM | POA: Diagnosis not present

## 2017-12-12 LAB — HEPATIC FUNCTION PANEL
ALT: 9 (ref 7–35)
AST: 15 (ref 13–35)
Alkaline Phosphatase: 56 (ref 25–125)
Bilirubin, Total: 0.5

## 2017-12-12 LAB — CBC AND DIFFERENTIAL
HEMATOCRIT: 39 (ref 36–46)
Hemoglobin: 12.5 (ref 12.0–16.0)
NEUTROS ABS: 4
Platelets: 185 (ref 150–399)
WBC: 6.4

## 2017-12-12 LAB — BASIC METABOLIC PANEL
BUN: 18 (ref 4–21)
Creatinine: 0.5 (ref 0.5–1.1)
GLUCOSE: 81
Potassium: 4.3 (ref 3.4–5.3)
SODIUM: 141 (ref 137–147)

## 2017-12-12 LAB — LIPID PANEL
CHOLESTEROL: 150 (ref 0–200)
HDL: 38 (ref 35–70)
LDL Cholesterol: 101
LDl/HDL Ratio: 4
TRIGLYCERIDES: 54 (ref 40–160)

## 2017-12-15 DIAGNOSIS — R2681 Unsteadiness on feet: Secondary | ICD-10-CM | POA: Diagnosis not present

## 2017-12-15 DIAGNOSIS — G252 Other specified forms of tremor: Secondary | ICD-10-CM | POA: Diagnosis not present

## 2017-12-15 DIAGNOSIS — Z9181 History of falling: Secondary | ICD-10-CM | POA: Diagnosis not present

## 2017-12-15 DIAGNOSIS — Z85841 Personal history of malignant neoplasm of brain: Secondary | ICD-10-CM | POA: Diagnosis not present

## 2017-12-15 DIAGNOSIS — R488 Other symbolic dysfunctions: Secondary | ICD-10-CM | POA: Diagnosis not present

## 2017-12-15 DIAGNOSIS — R278 Other lack of coordination: Secondary | ICD-10-CM | POA: Diagnosis not present

## 2017-12-16 DIAGNOSIS — Z9181 History of falling: Secondary | ICD-10-CM | POA: Diagnosis not present

## 2017-12-16 DIAGNOSIS — G252 Other specified forms of tremor: Secondary | ICD-10-CM | POA: Diagnosis not present

## 2017-12-16 DIAGNOSIS — Z85841 Personal history of malignant neoplasm of brain: Secondary | ICD-10-CM | POA: Diagnosis not present

## 2017-12-16 DIAGNOSIS — F329 Major depressive disorder, single episode, unspecified: Secondary | ICD-10-CM | POA: Diagnosis not present

## 2017-12-16 DIAGNOSIS — R2681 Unsteadiness on feet: Secondary | ICD-10-CM | POA: Diagnosis not present

## 2017-12-16 DIAGNOSIS — R488 Other symbolic dysfunctions: Secondary | ICD-10-CM | POA: Diagnosis not present

## 2017-12-16 DIAGNOSIS — R278 Other lack of coordination: Secondary | ICD-10-CM | POA: Diagnosis not present

## 2017-12-17 DIAGNOSIS — R278 Other lack of coordination: Secondary | ICD-10-CM | POA: Diagnosis not present

## 2017-12-17 DIAGNOSIS — R488 Other symbolic dysfunctions: Secondary | ICD-10-CM | POA: Diagnosis not present

## 2017-12-17 DIAGNOSIS — R2681 Unsteadiness on feet: Secondary | ICD-10-CM | POA: Diagnosis not present

## 2017-12-17 DIAGNOSIS — Z85841 Personal history of malignant neoplasm of brain: Secondary | ICD-10-CM | POA: Diagnosis not present

## 2017-12-17 DIAGNOSIS — Z9181 History of falling: Secondary | ICD-10-CM | POA: Diagnosis not present

## 2017-12-17 DIAGNOSIS — G252 Other specified forms of tremor: Secondary | ICD-10-CM | POA: Diagnosis not present

## 2017-12-17 NOTE — Progress Notes (Signed)
12/12/17 

## 2017-12-18 DIAGNOSIS — Z85841 Personal history of malignant neoplasm of brain: Secondary | ICD-10-CM | POA: Diagnosis not present

## 2017-12-18 DIAGNOSIS — R278 Other lack of coordination: Secondary | ICD-10-CM | POA: Diagnosis not present

## 2017-12-18 DIAGNOSIS — Z9181 History of falling: Secondary | ICD-10-CM | POA: Diagnosis not present

## 2017-12-18 DIAGNOSIS — R488 Other symbolic dysfunctions: Secondary | ICD-10-CM | POA: Diagnosis not present

## 2017-12-18 DIAGNOSIS — G252 Other specified forms of tremor: Secondary | ICD-10-CM | POA: Diagnosis not present

## 2017-12-18 DIAGNOSIS — R2681 Unsteadiness on feet: Secondary | ICD-10-CM | POA: Diagnosis not present

## 2017-12-19 DIAGNOSIS — Z9181 History of falling: Secondary | ICD-10-CM | POA: Diagnosis not present

## 2017-12-19 DIAGNOSIS — G252 Other specified forms of tremor: Secondary | ICD-10-CM | POA: Diagnosis not present

## 2017-12-19 DIAGNOSIS — R278 Other lack of coordination: Secondary | ICD-10-CM | POA: Diagnosis not present

## 2017-12-19 DIAGNOSIS — R2681 Unsteadiness on feet: Secondary | ICD-10-CM | POA: Diagnosis not present

## 2017-12-19 DIAGNOSIS — Z85841 Personal history of malignant neoplasm of brain: Secondary | ICD-10-CM | POA: Diagnosis not present

## 2017-12-19 DIAGNOSIS — R488 Other symbolic dysfunctions: Secondary | ICD-10-CM | POA: Diagnosis not present

## 2017-12-22 DIAGNOSIS — Z85841 Personal history of malignant neoplasm of brain: Secondary | ICD-10-CM | POA: Diagnosis not present

## 2017-12-22 DIAGNOSIS — R278 Other lack of coordination: Secondary | ICD-10-CM | POA: Diagnosis not present

## 2017-12-22 DIAGNOSIS — R2681 Unsteadiness on feet: Secondary | ICD-10-CM | POA: Diagnosis not present

## 2017-12-22 DIAGNOSIS — R488 Other symbolic dysfunctions: Secondary | ICD-10-CM | POA: Diagnosis not present

## 2017-12-22 DIAGNOSIS — G252 Other specified forms of tremor: Secondary | ICD-10-CM | POA: Diagnosis not present

## 2017-12-22 DIAGNOSIS — Z9181 History of falling: Secondary | ICD-10-CM | POA: Diagnosis not present

## 2017-12-23 DIAGNOSIS — Z85841 Personal history of malignant neoplasm of brain: Secondary | ICD-10-CM | POA: Diagnosis not present

## 2017-12-23 DIAGNOSIS — R2681 Unsteadiness on feet: Secondary | ICD-10-CM | POA: Diagnosis not present

## 2017-12-23 DIAGNOSIS — R488 Other symbolic dysfunctions: Secondary | ICD-10-CM | POA: Diagnosis not present

## 2017-12-23 DIAGNOSIS — G252 Other specified forms of tremor: Secondary | ICD-10-CM | POA: Diagnosis not present

## 2017-12-23 DIAGNOSIS — Z9181 History of falling: Secondary | ICD-10-CM | POA: Diagnosis not present

## 2017-12-23 DIAGNOSIS — R278 Other lack of coordination: Secondary | ICD-10-CM | POA: Diagnosis not present

## 2017-12-24 DIAGNOSIS — G252 Other specified forms of tremor: Secondary | ICD-10-CM | POA: Diagnosis not present

## 2017-12-24 DIAGNOSIS — R488 Other symbolic dysfunctions: Secondary | ICD-10-CM | POA: Diagnosis not present

## 2017-12-24 DIAGNOSIS — Z9181 History of falling: Secondary | ICD-10-CM | POA: Diagnosis not present

## 2017-12-24 DIAGNOSIS — R278 Other lack of coordination: Secondary | ICD-10-CM | POA: Diagnosis not present

## 2017-12-24 DIAGNOSIS — Z85841 Personal history of malignant neoplasm of brain: Secondary | ICD-10-CM | POA: Diagnosis not present

## 2017-12-24 DIAGNOSIS — R2681 Unsteadiness on feet: Secondary | ICD-10-CM | POA: Diagnosis not present

## 2017-12-25 DIAGNOSIS — R2681 Unsteadiness on feet: Secondary | ICD-10-CM | POA: Diagnosis not present

## 2017-12-25 DIAGNOSIS — G252 Other specified forms of tremor: Secondary | ICD-10-CM | POA: Diagnosis not present

## 2017-12-25 DIAGNOSIS — R278 Other lack of coordination: Secondary | ICD-10-CM | POA: Diagnosis not present

## 2017-12-25 DIAGNOSIS — R488 Other symbolic dysfunctions: Secondary | ICD-10-CM | POA: Diagnosis not present

## 2017-12-25 DIAGNOSIS — Z85841 Personal history of malignant neoplasm of brain: Secondary | ICD-10-CM | POA: Diagnosis not present

## 2017-12-25 DIAGNOSIS — Z9181 History of falling: Secondary | ICD-10-CM | POA: Diagnosis not present

## 2017-12-26 DIAGNOSIS — Z9181 History of falling: Secondary | ICD-10-CM | POA: Diagnosis not present

## 2017-12-26 DIAGNOSIS — R2681 Unsteadiness on feet: Secondary | ICD-10-CM | POA: Diagnosis not present

## 2017-12-26 DIAGNOSIS — G252 Other specified forms of tremor: Secondary | ICD-10-CM | POA: Diagnosis not present

## 2017-12-26 DIAGNOSIS — R278 Other lack of coordination: Secondary | ICD-10-CM | POA: Diagnosis not present

## 2017-12-26 DIAGNOSIS — Z85841 Personal history of malignant neoplasm of brain: Secondary | ICD-10-CM | POA: Diagnosis not present

## 2017-12-26 DIAGNOSIS — R488 Other symbolic dysfunctions: Secondary | ICD-10-CM | POA: Diagnosis not present

## 2017-12-29 DIAGNOSIS — G252 Other specified forms of tremor: Secondary | ICD-10-CM | POA: Diagnosis not present

## 2017-12-29 DIAGNOSIS — R2681 Unsteadiness on feet: Secondary | ICD-10-CM | POA: Diagnosis not present

## 2017-12-29 DIAGNOSIS — R488 Other symbolic dysfunctions: Secondary | ICD-10-CM | POA: Diagnosis not present

## 2017-12-29 DIAGNOSIS — Z85841 Personal history of malignant neoplasm of brain: Secondary | ICD-10-CM | POA: Diagnosis not present

## 2017-12-29 DIAGNOSIS — R278 Other lack of coordination: Secondary | ICD-10-CM | POA: Diagnosis not present

## 2017-12-29 DIAGNOSIS — Z9181 History of falling: Secondary | ICD-10-CM | POA: Diagnosis not present

## 2017-12-30 DIAGNOSIS — G252 Other specified forms of tremor: Secondary | ICD-10-CM | POA: Diagnosis not present

## 2017-12-30 DIAGNOSIS — R278 Other lack of coordination: Secondary | ICD-10-CM | POA: Diagnosis not present

## 2017-12-30 DIAGNOSIS — Z9181 History of falling: Secondary | ICD-10-CM | POA: Diagnosis not present

## 2017-12-30 DIAGNOSIS — R488 Other symbolic dysfunctions: Secondary | ICD-10-CM | POA: Diagnosis not present

## 2017-12-30 DIAGNOSIS — Z85841 Personal history of malignant neoplasm of brain: Secondary | ICD-10-CM | POA: Diagnosis not present

## 2017-12-30 DIAGNOSIS — R2681 Unsteadiness on feet: Secondary | ICD-10-CM | POA: Diagnosis not present

## 2017-12-31 ENCOUNTER — Other Ambulatory Visit: Payer: Self-pay

## 2017-12-31 DIAGNOSIS — R2681 Unsteadiness on feet: Secondary | ICD-10-CM | POA: Diagnosis not present

## 2017-12-31 DIAGNOSIS — R488 Other symbolic dysfunctions: Secondary | ICD-10-CM | POA: Diagnosis not present

## 2017-12-31 DIAGNOSIS — Z85841 Personal history of malignant neoplasm of brain: Secondary | ICD-10-CM | POA: Diagnosis not present

## 2017-12-31 DIAGNOSIS — R278 Other lack of coordination: Secondary | ICD-10-CM | POA: Diagnosis not present

## 2017-12-31 DIAGNOSIS — G252 Other specified forms of tremor: Secondary | ICD-10-CM | POA: Diagnosis not present

## 2017-12-31 DIAGNOSIS — Z9181 History of falling: Secondary | ICD-10-CM | POA: Diagnosis not present

## 2017-12-31 MED ORDER — CLONAZEPAM 0.25 MG PO TBDP: ORAL_TABLET | ORAL | 0 refills | Status: DC

## 2017-12-31 NOTE — Telephone Encounter (Signed)
Hard script given to the nurse by NP.

## 2018-01-07 ENCOUNTER — Encounter: Payer: Self-pay | Admitting: Neurology

## 2018-01-07 DIAGNOSIS — G252 Other specified forms of tremor: Secondary | ICD-10-CM | POA: Diagnosis not present

## 2018-01-07 DIAGNOSIS — R488 Other symbolic dysfunctions: Secondary | ICD-10-CM | POA: Diagnosis not present

## 2018-01-08 ENCOUNTER — Non-Acute Institutional Stay (SKILLED_NURSING_FACILITY): Payer: Medicare Other | Admitting: Internal Medicine

## 2018-01-08 ENCOUNTER — Encounter: Payer: Self-pay | Admitting: Internal Medicine

## 2018-01-08 DIAGNOSIS — M25511 Pain in right shoulder: Secondary | ICD-10-CM | POA: Diagnosis not present

## 2018-01-08 DIAGNOSIS — Z0189 Encounter for other specified special examinations: Secondary | ICD-10-CM

## 2018-01-08 DIAGNOSIS — F419 Anxiety disorder, unspecified: Secondary | ICD-10-CM

## 2018-01-08 DIAGNOSIS — M25512 Pain in left shoulder: Secondary | ICD-10-CM

## 2018-01-08 DIAGNOSIS — Z0279 Encounter for issue of other medical certificate: Secondary | ICD-10-CM | POA: Diagnosis not present

## 2018-01-08 DIAGNOSIS — G8929 Other chronic pain: Secondary | ICD-10-CM

## 2018-01-08 NOTE — Patient Instructions (Addendum)
See assessment and plan under each diagnosis in the problem list and acutely for this visit Total time 41 minutes; greater than 50% of the visit spent counseling patient and coordinating care for problems addressed at this encounter  

## 2018-01-08 NOTE — Progress Notes (Signed)
NURSING HOME LOCATION:  [facility] ROOM NUMBER:  [id number]  CODE STATUS:  Full Code  PCP:  Hendricks Limes, MD  [place] [address]  This is a nursing facility follow up of chronic medical diagnoses and competency evaluation.    Interim medical record and care since last [place] visit was updated with review of diagnostic studies and change in clinical status since last visit were documented.  HPI: The patient is a permanent resident of SNF with multiple comorbidities. These include a history of astrocytoma brain tumor, mitral regurgitation, and mitral valve prolapse. Other diagnoses include osteoarthritis, osteoporosis, chronic diastolic heart failure, overactive bladder, dyslipidemia, history C. difficile colitis, peripheral artery disease, vitamin D deficiency, anemia, depression, anxiety disorder, paroxysmal A. fib, and pituitary disorder. Dr. Tomi Likens follows the patient for tremor and ataxia. He last saw the patient 3/18. The tremor progressed after she had mitral valve repair in 2013. The tremor typically is worse in the morning. The tremor has been a source of anxiety for which Lexapro was prescribed. This helped the anxiety but not the shaking. Clonazepam initially was associated with hallucinations but this subsided. Dr. Tomi Likens adjusted the clonazepam dose to 0.125 mg in the morning and 0.25 mg at night He stated that the astrocytoma and its therapy was complicated by residual spasticity, cerebellar dysfunction, and neurogenic bladder. She remains nonambulatory but can transfer from bed to wheelchair to use the bathroom. Procedures and surgeries include radiation therapy and ventriculoperitoneal shunting for astrocytoma. Also the patient has had mitral valve repair in 2013. Labs 12/12/17 revealed normal chemistries, renal function, lipids, and CBC. Assessment of patient's capability to manage her benefits has been requested by [employer].  Mental status testing was completed on 12/09/17 with a score 22/30. This would suggest mild neurocognitive defect. Repeat testing on 01/07/18 revealed a normal value of 28 out of 30.  Review of systems: Patient is fully oriented and able to give an excellent history. She stated that she formerly lived with her sister. She formerly lived independently but apparently the home was damaged by falling trees. She with the help of her sister has managed her finances.  She is noncommittal as to any significant response of the tremor to the Clonazepam. Her only complaint is some pain in the right shoulder with elevation which she relates to "overuse" over the years.   Constitutional: No fever, significant weight change  Eyes: No redness, discharge, pain, vision change ENT/mouth: No nasal congestion,  purulent discharge, earache, change in hearing, sore throat  Cardiovascular: No chest pain, palpitations, paroxysmal nocturnal dyspnea, claudication, edema  Respiratory: No cough, sputum production, hemoptysis, DOE , significant snoring, apnea   Gastrointestinal: No heartburn, dysphagia, abdominal pain, nausea /vomiting, rectal bleeding, melena, change in bowels Genitourinary: No dysuria, hematuria, pyuria, incontinence, nocturia Dermatologic: No rash, pruritus, change in appearance of skin Neurologic: No dizziness, headache, syncope, seizures Psychiatric: No insomnia, anorexia Endocrine: No change in hair/skin/ nails, excessive thirst, excessive hunger, excessive urination  Hematologic/lymphatic: No significant bruising, lymphadenopathy, abnormal bleeding Allergy/immunology: No itchy/watery eyes, significant sneezing, urticaria, angioedema  Physical exam:  Pertinent or positive findings: She is diminutive in stature. She has a constant bobbing head tremor. The tremor of the head is accentuated & accompaniued by trunk tremor when she moves her torso. There is decreased left nasolabial fold. Esotropia is present  on the left. She has a gallop tight cadence. Second heart sound is increased. She has athetoid movements of the extremities. She  is weak but more so in the right upper extremity.  She has decreased range of motion of the right upper extremity with pain with elevation.  Pedal pulses are decreased. She exhibits some halting speech pattern.  General appearance:  no acute distress, increased work of breathing is present.   Lymphatic: No lymphadenopathy about the head, neck, axilla. Eyes: No conjunctival inflammation or lid edema is present. There is no scleral icterus. Ears:  External ear exam shows no significant lesions or deformities.   Nose:  External nasal examination shows no deformity or inflammation. Nasal mucosa are pink and moist without lesions, exudates Oral exam:  Lips and gums are healthy appearing. There is no oropharyngeal erythema or exudate. Neck:  No thyromegaly, masses, tenderness noted.    Heart:  Normal rate and regular rhythm. S1 normal without gallop, murmur, click, rub .  Lungs:Chest clear to auscultation without wheezes, rhonchi,rales , rubs. Abdomen:Bowel sounds are normal. Abdomen is soft and nontender with no organomegaly, hernias,masses. GU: deferred  Extremities:  No cyanosis, clubbing,edema  Neurologic exam : Balance,Rhomberg,finger to nose testing could not be completed due to clinical state Skin: Warm & dry w/o tenting. No significant lesions or rash.  See summary under each active problem in the Problem List with associated updated therapeutic plan

## 2018-01-08 NOTE — Assessment & Plan Note (Signed)
01/08/18 based on my interview and exam and the results of the [facility] testing the patient is competent. She may wish to have her sister help her with some financial issues as she has done in the past.

## 2018-01-08 NOTE — Assessment & Plan Note (Signed)
01/08/18 right worse than left; exam suggests shoulder impingement syndrome related to repetitive use disorder Orthopedic consult if indicated after PT/OT evaluation

## 2018-01-20 DIAGNOSIS — Z79899 Other long term (current) drug therapy: Secondary | ICD-10-CM | POA: Diagnosis not present

## 2018-01-20 DIAGNOSIS — I4891 Unspecified atrial fibrillation: Secondary | ICD-10-CM | POA: Diagnosis not present

## 2018-01-20 DIAGNOSIS — D649 Anemia, unspecified: Secondary | ICD-10-CM | POA: Diagnosis not present

## 2018-01-20 LAB — BASIC METABOLIC PANEL
BUN: 20 (ref 4–21)
CREATININE: 0.5 (ref 0.5–1.1)
GLUCOSE: 90
Potassium: 3.9 (ref 3.4–5.3)
Sodium: 144 (ref 137–147)

## 2018-01-21 DIAGNOSIS — Z79899 Other long term (current) drug therapy: Secondary | ICD-10-CM | POA: Diagnosis not present

## 2018-01-22 ENCOUNTER — Non-Acute Institutional Stay (SKILLED_NURSING_FACILITY): Payer: Medicare Other

## 2018-01-22 DIAGNOSIS — Z0000 Encounter for general adult medical examination without abnormal findings: Secondary | ICD-10-CM | POA: Diagnosis not present

## 2018-01-22 NOTE — Patient Instructions (Signed)
Michaela [name] , Thank you for taking time to come for your Medicare Wellness Visit. I appreciate your ongoing commitment to your health goals. Please review the following plan we discussed and let me know if I can assist you in the future.   Screening recommendations/referrals: Colonoscopy due, fecal occult ordered Mammogram due, ordered Bone Density due, ordered Recommended yearly ophthalmology/optometry visit for glaucoma screening and checkup Recommended yearly dental visit for hygiene and checkup  Vaccinations: Influenza vaccine up to date, due 2019 fall season Pneumococcal vaccine due at age 36 Tdap vaccine due-ordered Shingles vaccine not in past records    Advanced directives: Need a copy for chart  Conditions/risks identified: none  Next appointment: Dr. Linna Darner makes rounds  Preventive Care 40-64 Years, Female Preventive care refers to lifestyle choices and visits with your health care provider that can promote health and wellness. What does preventive care include?  A yearly physical exam. This is also called an annual well check.  Dental exams once or twice a year.  Routine eye exams. Ask your health care provider how often you should have your eyes checked.  Personal lifestyle choices, including:  Daily care of your teeth and gums.  Regular physical activity.  Eating a healthy diet.  Avoiding tobacco and drug use.  Limiting alcohol use.  Practicing safe sex.  Taking low-dose aspirin daily starting at age 47.  Taking vitamin and mineral supplements as recommended by your health care provider. What happens during an annual well check? The services and screenings done by your health care provider during your annual well check will depend on your age, overall health, lifestyle risk factors, and family history of disease. Counseling  Your health care provider may ask you questions about your:  Alcohol use.  Tobacco use.  Drug use.  Emotional  well-being.  Home and relationship well-being.  Sexual activity.  Eating habits.  Work and work Statistician.  Method of birth control.  Menstrual cycle.  Pregnancy history. Screening  You may have the following tests or measurements:  Height, weight, and BMI.  Blood pressure.  Lipid and cholesterol levels. These may be checked every 5 years, or more frequently if you are over 17 years old.  Skin check.  Lung cancer screening. You may have this screening every year starting at age 89 if you have a 30-pack-year history of smoking and currently smoke or have quit within the past 15 years.  Fecal occult blood test (FOBT) of the stool. You may have this test every year starting at age 43.  Flexible sigmoidoscopy or colonoscopy. You may have a sigmoidoscopy every 5 years or a colonoscopy every 10 years starting at age 33.  Hepatitis C blood test.  Hepatitis B blood test.  Sexually transmitted disease (STD) testing.  Diabetes screening. This is done by checking your blood sugar (glucose) after you have not eaten for a while (fasting). You may have this done every 1-3 years.  Mammogram. This may be done every 1-2 years. Talk to your health care provider about when you should start having regular mammograms. This may depend on whether you have a family history of breast cancer.  BRCA-related cancer screening. This may be done if you have a family history of breast, ovarian, tubal, or peritoneal cancers.  Pelvic exam and Pap test. This may be done every 3 years starting at age 85. Starting at age 54, this may be done every 5 years if you have a Pap test in combination with an HPV test.  Bone density scan. This is done to screen for osteoporosis. You may have this scan if you are at high risk for osteoporosis. Discuss your test results, treatment options, and if necessary, the need for more tests with your health care provider. Vaccines  Your health care provider may recommend  certain vaccines, such as:  Influenza vaccine. This is recommended every year.  Tetanus, diphtheria, and acellular pertussis (Tdap, Td) vaccine. You may need a Td booster every 10 years.  Zoster vaccine. You may need this after age 48.  Pneumococcal 13-valent conjugate (PCV13) vaccine. You may need this if you have certain conditions and were not previously vaccinated.  Pneumococcal polysaccharide (PPSV23) vaccine. You may need one or two doses if you smoke cigarettes or if you have certain conditions. Talk to your health care provider about which screenings and vaccines you need and how often you need them. This information is not intended to replace advice given to you by your health care provider. Make sure you discuss any questions you have with your health care provider. Document Released: 09/22/2015 Document Revised: 05/15/2016 Document Reviewed: 06/27/2015 Elsevier Interactive Patient Education  2017 [place] Prevention in the Home Falls can cause injuries. They can happen to people of all ages. There are many things you can do to make your home safe and to help prevent falls. What can I do on the outside of my home?  Regularly fix the edges of walkways and driveways and fix any cracks.  Remove anything that might make you trip as you walk through a door, such as a raised step or threshold.  Trim any bushes or trees on the path to your home.  Use bright outdoor lighting.  Clear any walking paths of anything that might make someone trip, such as rocks or tools.  Regularly check to see if handrails are loose or broken. Make sure that both sides of any steps have handrails.  Any raised decks and porches should have guardrails on the edges.  Have any leaves, snow, or ice cleared regularly.  Use sand or salt on walking paths during winter.  Clean up any spills in your garage right away. This includes oil or grease spills. What can I do in the bathroom?  Use  night lights.  Install grab bars by the toilet and in the tub and shower. Do not use towel bars as grab bars.  Use non-skid mats or decals in the tub or shower.  If you need to sit down in the shower, use a plastic, non-slip stool.  Keep the floor dry. Clean up any water that spills on the floor as soon as it happens.  Remove soap buildup in the tub or shower regularly.  Attach bath mats securely with double-sided non-slip rug tape.  Do not have throw rugs and other things on the floor that can make you trip. What can I do in the bedroom?  Use night lights.  Make sure that you have a light by your bed that is easy to reach.  Do not use any sheets or blankets that are too big for your bed. They should not hang down onto the floor.  Have a firm chair that has side arms. You can use this for support while you get dressed.  Do not have throw rugs and other things on the floor that can make you trip. What can I do in the kitchen?  Clean up any spills right away.  Avoid walking on  wet floors.  Keep items that you use a lot in easy-to-reach places.  If you need to reach something above you, use a strong step stool that has a grab bar.  Keep electrical cords out of the way.  Do not use floor polish or wax that makes floors slippery. If you must use wax, use non-skid floor wax.  Do not have throw rugs and other things on the floor that can make you trip. What can I do with my stairs?  Do not leave any items on the stairs.  Make sure that there are handrails on both sides of the stairs and use them. Fix handrails that are broken or loose. Make sure that handrails are as long as the stairways.  Check any carpeting to make sure that it is firmly attached to the stairs. Fix any carpet that is loose or worn.  Avoid having throw rugs at the top or bottom of the stairs. If you do have throw rugs, attach them to the floor with carpet tape.  Make sure that you have a light switch at  the top of the stairs and the bottom of the stairs. If you do not have them, ask someone to add them for you. What else can I do to help prevent falls?  Wear shoes that:  Do not have high heels.  Have rubber bottoms.  Are comfortable and fit you well.  Are closed at the toe. Do not wear sandals.  If you use a stepladder:  Make sure that it is fully opened. Do not climb a closed stepladder.  Make sure that both sides of the stepladder are locked into place.  Ask someone to hold it for you, if possible.  Clearly mark and make sure that you can see:  Any grab bars or handrails.  First and last steps.  Where the edge of each step is.  Use tools that help you move around (mobility aids) if they are needed. These include:  Canes.  Walkers.  Scooters.  Crutches.  Turn on the lights when you go into a dark area. Replace any light bulbs as soon as they burn out.  Set up your furniture so you have a clear path. Avoid moving your furniture around.  If any of your floors are uneven, fix them.  If there are any pets around you, be aware of where they are.  Review your medicines with your doctor. Some medicines can make you feel dizzy. This can increase your chance of falling. Ask your doctor what other things that you can do to help prevent falls. This information is not intended to replace advice given to you by your health care provider. Make sure you discuss any questions you have with your health care provider. Document Released: 06/22/2009 Document Revised: 02/01/2016 Document Reviewed: 09/30/2014 Elsevier Interactive Patient Education  2017 [employer].

## 2018-01-22 NOTE — Progress Notes (Addendum)
Subjective:   Michaela [name] is a 62 y.o. female who presents for Medicare Annual (Subsequent) preventive examination at [facility]  Last AWV-11/06/2015    Objective:     Vitals: BP 118/61 (BP Location: Right Arm, Patient Position: Sitting)   Pulse 87   Temp 97.9 F (36.6 C) (Oral)   Ht 4\' 11"  (1.499 m)   Wt 89 lb (40.4 kg)   SpO2 97%   BMI 17.98 kg/m   Body mass index is 17.98 kg/m.  Advanced Directives 01/22/2018 05/09/2016 04/01/2016 11/21/2011 10/21/2011 10/11/2011  Does Patient Have a Medical Advance Directive? No Yes No Patient does not have advance directive Patient does not have advance directive;Patient would not like information Patient does not have advance directive;Patient would like information  Type of Advance Directive - [place]  Would patient like information on creating a medical advance directive? No - Patient declined - No - patient declined information - - Advance directive packet given  Pre-existing out of facility DNR order (yellow form or pink MOST form) - - - No No No    Tobacco Social History   Tobacco Use  Smoking Status Former Smoker  . Last attempt to quit: 09/09/1990  . Years since quitting: 27.3  Smokeless Tobacco Never Used     Counseling given: Not Answered   Clinical Intake:  Pre-visit preparation completed: No  Pain : No/denies pain     Nutritional Risks: None Diabetes: No  How often do you need to have someone help you when you read instructions, pamphlets, or other written materials from your doctor or pharmacy?: 3 - Sometimes  Interpreter Needed?: No  Information entered by :: Tyson Dense, RN  Past Medical History:  Diagnosis Date  . 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 C. difficile colitis 11/8464   complicated by sepsis/ARDS and prolonged hosp  . Depression   . Hyperlipidemia   . Mitral regurgitation 10/2011   severe, s/p min invasive  repair and annuloplasty  . Mitral valve prolapse   . Neuromuscular disorder (HCC)    numbness in hands and feet  . Osteoarthritis   . Osteoporosis    DEXA @ [facility] 11/2012: -3.5, prior tx Reclast, last in 2011, rec change to Prolia  . Overactive bladder   . Pleural effusion, left 11/11/2011  . Vitamin D deficiency    Past Surgical History:  Procedure Laterality Date  . CARDIAC CATHETERIZATION     Jan 2013  . KNEE ARTHROSCOPY Right   . MITRAL VALVE REPAIR  10/15/2011   Procedure: MINIMALLY INVASIVE MITRAL VALVE REPAIR (MVR);  Surgeon: Rexene Alberts, MD;  Location: [facility];  Service: Open Heart Surgery;  Laterality: Right;  . TRANSESOPHAGEAL ECHOCARDIOGRAM  7/12  . US ECHOCARDIOGRAPHY  6/12  . VENTRICULOPERITONEAL SHUNT  1971   Family History  Problem Relation Age of Onset  . Coronary artery disease Father 32       died MI  . Hyperlipidemia Mother   . Heart attack Maternal Grandfather    Social History   Socioeconomic History  . Marital status: Single    Spouse name: Not on file  . Number of children: 0  . Years of education: Not on file  . Highest education level: Not on file  Occupational History  . Occupation: disabled  Social Needs  . Financial resource strain: Not hard at all  . Food insecurity:    Worry:  Never true    Inability: Never true  . Transportation needs:    Medical: No    Non-medical: No  Tobacco Use  . Smoking status: Former Smoker    Last attempt to quit: 09/09/1990    Years since quitting: 27.3  . Smokeless tobacco: Never Used  Substance and Sexual Activity  . Alcohol use: Yes    Alcohol/week: 0.0 oz    Comment: rarely  . Drug use: No  . Sexual activity: Not on file  Lifestyle  . Physical activity:    Days per week: 0 days    Minutes per session: 0 min  . Stress: Only a little  Relationships  . Social connections:    Talks on phone: Three times a week    Gets together: Three times a week    Attends religious service: Never    Active member of  club or organization: Not on file    Attends meetings of clubs or organizations: Never    Relationship status: Never married  Other Topics Concern  . Not on file  Social History Narrative   [facility].    Outpatient Encounter Medications as of 01/22/2018  Medication Sig  . atorvastatin (LIPITOR) 10 MG tablet TAKE ONE TABLET BY MOUTH DAILY  . Calcium Carbonate-Vitamin D (CALCIUM 600/VITAMIN D PO) Take 1 tablet by mouth daily. Calcium 600 - vitamin D3 200  . clonazePAM (KLONOPIN) 0.25 MG disintegrating tablet Take 1/2 tablet in AM and 1 tablet at bedtime.  Marland Kitchen denosumab (PROLIA) 60 MG/ML SOLN injection Inject 60 mg into the skin every 6 (six) months. Administer in upper arm, thigh, or abdomen  . escitalopram (LEXAPRO) 10 MG tablet Take 1 tablet (10 mg total) by mouth daily.  Marland Kitchen linaclotide (LINZESS) 145 MCG CAPS capsule TAKE ONE CAPSULE BY MOUTH DAILY BEFORE BREAKFAST  . Menthol, Topical Analgesic, (BIOFREEZE) 4 % GEL Apply 1 application topically 2 (two) times daily. Apply topically BID to sore areas  . mirabegron ER (MYRBETRIQ) 25 MG TB24 tablet Take 25 mg by mouth 2 (two) times daily.  Marland Kitchen NUTRITIONAL SUPPLEMENT LIQD Take 120 mLs by mouth 2 (two) times daily. MedPass   . polyvinyl alcohol (LIQUIFILM TEARS) 1.4 % ophthalmic solution Place 1 drop into both eyes 2 (two) times daily.  . White Petrolatum-Mineral Oil (LUBRIFRESH P.M. OP) Apply 1 application to eye at bedtime. Apply to right eye  . [DISCONTINUED] imipramine (TOFRANIL) 25 MG tablet Take 25 mg by mouth at bedtime.   . [DISCONTINUED] metoprolol tartrate (LOPRESSOR) 12.5 mg TABS Take 12.5 mg by mouth 2 (two) times daily.  . [DISCONTINUED] polysaccharide iron (NIFEREX) 150 MG CAPS capsule Take 1 capsule (150 mg total) by mouth daily.  . [DISCONTINUED] pravastatin (PRAVACHOL) 80 MG tablet Take 80 mg by mouth daily.   No facility-administered encounter medications on file as of 01/22/2018.     Activities of  Daily Living In your present state of health, do you have any difficulty performing the following activities: 01/22/2018  Hearing? N  Vision? N  Difficulty concentrating or making decisions? Y  Walking or climbing stairs? Y  Dressing or bathing? Y  Doing errands, shopping? Y  Preparing Food and eating ? Y  Using the Toilet? Y  In the past six months, have you accidently leaked urine? Y  Do you have problems with loss of bowel control? N  Managing your Medications? Y  Managing your Finances? Y  Housekeeping or managing your Housekeeping? Darreld Mclean  Some recent data might be hidden    Patient Care Team: Hendricks Limes, MD as PCP - General (Internal Medicine) Larey Dresser, MD as Referring Physician (Cardiology) Larey Dresser, MD (Cardiology) Medina-Vargas, Senaida Lange, NP as Nurse Practitioner (Internal Medicine)    Assessment:   This is a routine wellness examination for [place].  Exercise Activities and Dietary recommendations Current Exercise Habits: The patient does not participate in regular exercise at present, Exercise limited by: orthopedic condition(s)  Goals    None      Fall Risk Fall Risk  01/22/2018 11/24/2017 06/29/2015 11/11/2012  Falls in the past year? Yes Yes Yes No  Number falls in past yr: 1 2 or more 2 or more -  Injury with Fall? Yes Yes Yes -  Comment - - bruising  -  Risk Factor Category  - High Fall Risk High Fall Risk -  Risk for fall due to : - - Impaired balance/gait;Impaired mobility;History of fall(s) Impaired balance/gait;Impaired mobility  Follow up - Falls evaluation completed - -   Is the patient's home free of loose throw rugs in walkways, pet beds, electrical cords, etc?   yes      Grab bars in the bathroom? yes      Handrails on the stairs?   yes      Adequate lighting?   yes  Timed Get Up and Go performed: unable to perform, patient is unambulaory  Depression Screen PHQ 2/9 Scores 01/22/2018 11/24/2017 11/11/2012  PHQ - 2 Score 0 0 0      Cognitive Function     6CIT Screen 01/22/2018  What Year? 0 points  What month? 0 points  What time? 0 points  Count back from 20 0 points  Months in reverse 4 points  Repeat phrase 2 points  Total Score 6    Immunization History  Administered Date(s) Administered  . Influenza Split 08/11/2012  . Influenza,inj,Quad PF,6+ Mos 05/14/2013, 07/19/2014, 06/29/2015, 07/01/2016  . Influenza-Unspecified 06/03/2011  . Pneumococcal Polysaccharide-23 11/23/2011    Qualifies for Shingles Vaccine? Not in past records  Screening Tests Health Maintenance  Topic Date Due  . PAP SMEAR  12/02/1978  . MAMMOGRAM  06/20/2013  . COLONOSCOPY  07/27/2014  . TETANUS/TDAP  12/12/2018 (Originally 12/01/1976)  . Hepatitis C Screening  01/09/2019 (Originally 12[birth year]07/1957)  . HIV Screening  01/09/2019 (Originally 12/01/1972)  . INFLUENZA VACCINE  04/09/2018    Cancer Screenings: Lung: Low Dose CT Chest recommended if Age 59-80 years, 30 pack-year currently smoking OR have quit w/in 15years. Patient does not qualify. Breast:  Up to date on Mammogram? No, ordered Up to date of Bone Density/Dexa? No, ordered Colorectal: Not up to date, fecal occult ordered  Additional Screenings:  Hepatitis C Screening: declined TDAP due-ordered    Plan:    I have personally reviewed and addressed the Medicare Annual Wellness questionnaire and have noted the following in the patient's chart:  A. Medical and social history B. Use of alcohol, tobacco or illicit drugs  C. Current medications and supplements D. Functional ability and status E.  Nutritional status F.  Physical activity G. Advance directives H. List of other physicians I.  Hospitalizations, surgeries, and ER visits in previous 12 months J.  [place] to include hearing, vision, cognitive, depression L. Referrals and appointments - none  In addition, I have reviewed and discussed with patient certain preventive protocols, quality  metrics, and best practice recommendations. A written personalized care plan for preventive services  as well as general preventive health recommendations were provided to patient.  See attached scanned questionnaire for additional information.   Signed,   Tyson Dense, RN Nurse Health Advisor  Patient Concerns: Urinary frequency I have personally reviewed the health advisor's clinical note, was available for consultation, and agree with the assessment and plan as written. Hendricks Limes M.D., FACP, [facility]

## 2018-01-26 DIAGNOSIS — R319 Hematuria, unspecified: Secondary | ICD-10-CM | POA: Diagnosis not present

## 2018-01-26 DIAGNOSIS — Z79899 Other long term (current) drug therapy: Secondary | ICD-10-CM | POA: Diagnosis not present

## 2018-01-27 ENCOUNTER — Other Ambulatory Visit: Payer: Self-pay | Admitting: Internal Medicine

## 2018-01-27 DIAGNOSIS — Z1231 Encounter for screening mammogram for malignant neoplasm of breast: Secondary | ICD-10-CM

## 2018-01-28 ENCOUNTER — Other Ambulatory Visit: Payer: Self-pay | Admitting: Internal Medicine

## 2018-01-28 DIAGNOSIS — M25511 Pain in right shoulder: Secondary | ICD-10-CM | POA: Diagnosis not present

## 2018-01-28 DIAGNOSIS — M25512 Pain in left shoulder: Secondary | ICD-10-CM | POA: Diagnosis not present

## 2018-01-28 DIAGNOSIS — R278 Other lack of coordination: Secondary | ICD-10-CM | POA: Diagnosis not present

## 2018-01-28 DIAGNOSIS — E2839 Other primary ovarian failure: Secondary | ICD-10-CM

## 2018-01-29 DIAGNOSIS — M25512 Pain in left shoulder: Secondary | ICD-10-CM | POA: Diagnosis not present

## 2018-01-29 DIAGNOSIS — R278 Other lack of coordination: Secondary | ICD-10-CM | POA: Diagnosis not present

## 2018-01-29 DIAGNOSIS — M25511 Pain in right shoulder: Secondary | ICD-10-CM | POA: Diagnosis not present

## 2018-01-30 DIAGNOSIS — M25511 Pain in right shoulder: Secondary | ICD-10-CM | POA: Diagnosis not present

## 2018-01-30 DIAGNOSIS — M25512 Pain in left shoulder: Secondary | ICD-10-CM | POA: Diagnosis not present

## 2018-01-30 DIAGNOSIS — R278 Other lack of coordination: Secondary | ICD-10-CM | POA: Diagnosis not present

## 2018-01-31 DIAGNOSIS — M25511 Pain in right shoulder: Secondary | ICD-10-CM | POA: Diagnosis not present

## 2018-01-31 DIAGNOSIS — M25512 Pain in left shoulder: Secondary | ICD-10-CM | POA: Diagnosis not present

## 2018-01-31 DIAGNOSIS — R278 Other lack of coordination: Secondary | ICD-10-CM | POA: Diagnosis not present

## 2018-02-03 DIAGNOSIS — M25512 Pain in left shoulder: Secondary | ICD-10-CM | POA: Diagnosis not present

## 2018-02-03 DIAGNOSIS — R278 Other lack of coordination: Secondary | ICD-10-CM | POA: Diagnosis not present

## 2018-02-03 DIAGNOSIS — M25511 Pain in right shoulder: Secondary | ICD-10-CM | POA: Diagnosis not present

## 2018-02-04 DIAGNOSIS — R278 Other lack of coordination: Secondary | ICD-10-CM | POA: Diagnosis not present

## 2018-02-04 DIAGNOSIS — M25512 Pain in left shoulder: Secondary | ICD-10-CM | POA: Diagnosis not present

## 2018-02-04 DIAGNOSIS — M25511 Pain in right shoulder: Secondary | ICD-10-CM | POA: Diagnosis not present

## 2018-02-05 DIAGNOSIS — R278 Other lack of coordination: Secondary | ICD-10-CM | POA: Diagnosis not present

## 2018-02-05 DIAGNOSIS — M25511 Pain in right shoulder: Secondary | ICD-10-CM | POA: Diagnosis not present

## 2018-02-05 DIAGNOSIS — M25512 Pain in left shoulder: Secondary | ICD-10-CM | POA: Diagnosis not present

## 2018-02-06 DIAGNOSIS — M25512 Pain in left shoulder: Secondary | ICD-10-CM | POA: Diagnosis not present

## 2018-02-06 DIAGNOSIS — M25511 Pain in right shoulder: Secondary | ICD-10-CM | POA: Diagnosis not present

## 2018-02-06 DIAGNOSIS — R278 Other lack of coordination: Secondary | ICD-10-CM | POA: Diagnosis not present

## 2018-02-09 DIAGNOSIS — M25511 Pain in right shoulder: Secondary | ICD-10-CM | POA: Diagnosis not present

## 2018-02-09 DIAGNOSIS — G252 Other specified forms of tremor: Secondary | ICD-10-CM | POA: Diagnosis not present

## 2018-02-09 DIAGNOSIS — Z85841 Personal history of malignant neoplasm of brain: Secondary | ICD-10-CM | POA: Diagnosis not present

## 2018-02-09 DIAGNOSIS — R278 Other lack of coordination: Secondary | ICD-10-CM | POA: Diagnosis not present

## 2018-02-09 DIAGNOSIS — M25512 Pain in left shoulder: Secondary | ICD-10-CM | POA: Diagnosis not present

## 2018-02-10 ENCOUNTER — Encounter: Payer: Self-pay | Admitting: Adult Health

## 2018-02-10 ENCOUNTER — Non-Acute Institutional Stay (SKILLED_NURSING_FACILITY): Payer: Medicare Other | Admitting: Adult Health

## 2018-02-10 DIAGNOSIS — M25512 Pain in left shoulder: Secondary | ICD-10-CM | POA: Diagnosis not present

## 2018-02-10 DIAGNOSIS — F339 Major depressive disorder, recurrent, unspecified: Secondary | ICD-10-CM

## 2018-02-10 DIAGNOSIS — R278 Other lack of coordination: Secondary | ICD-10-CM | POA: Diagnosis not present

## 2018-02-10 DIAGNOSIS — F419 Anxiety disorder, unspecified: Secondary | ICD-10-CM

## 2018-02-10 DIAGNOSIS — M25511 Pain in right shoulder: Secondary | ICD-10-CM | POA: Diagnosis not present

## 2018-02-10 DIAGNOSIS — Z85841 Personal history of malignant neoplasm of brain: Secondary | ICD-10-CM | POA: Diagnosis not present

## 2018-02-10 DIAGNOSIS — E785 Hyperlipidemia, unspecified: Secondary | ICD-10-CM

## 2018-02-10 DIAGNOSIS — G252 Other specified forms of tremor: Secondary | ICD-10-CM | POA: Diagnosis not present

## 2018-02-10 DIAGNOSIS — M810 Age-related osteoporosis without current pathological fracture: Secondary | ICD-10-CM

## 2018-02-10 DIAGNOSIS — H04123 Dry eye syndrome of bilateral lacrimal glands: Secondary | ICD-10-CM | POA: Diagnosis not present

## 2018-02-10 DIAGNOSIS — N3281 Overactive bladder: Secondary | ICD-10-CM

## 2018-02-10 DIAGNOSIS — K5909 Other constipation: Secondary | ICD-10-CM | POA: Diagnosis not present

## 2018-02-10 NOTE — Progress Notes (Addendum)
Location:  [name] Room Number: 104-A Place of Service:  SNF (31) Provider:  Durenda Age, NP  Patient Care Team: Hendricks Limes, MD as PCP - General (Internal Medicine) Larey Dresser, MD as Referring Physician (Cardiology) Larey Dresser, MD (Cardiology) Medina-Vargas, Senaida Lange, NP as Nurse Practitioner (Internal Medicine)  Extended Emergency Contact Information Primary Emergency Contact: Michaela [name], [place] [address] [name] Phone: [phone] Relation: Sister Secondary Emergency Contact: [name],Michaela Address: [address]          [place], [place] [address] [name] of [place] Phone: [phone] Mobile Phone: [phone] Relation: Mother  Code Status:  Full Code  Goals of care: Advanced Directive information Advanced Directives 01/22/2018  Does Patient Have a Medical Advance Directive? No  Type of Advance Directive -  Would patient like information on creating a medical advance directive? No - Patient declined  Pre-existing out of facility DNR order (yellow form or pink MOST form) -     Chief Complaint  Patient presents with  . Medical Management of Chronic Issues    Patient is seen for a routine [facility] visit    HPI:  Pt is a 62 y.o. female seen today for medical management of chronic diseases.  She is a long-term care resident of [facility].  She has a PMH of astrocytoma brain tumor, mitral regurgitation, mitral valve prolapse, OA, and osteoporosis. She was seen in the room today. She complained of slight constipation and requested to have 2 prunes on breakfast trays. She is currently taking Linzess for chronic constipation.    Past Medical History:  Diagnosis Date  . 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 C. difficile colitis 0/2585   complicated by sepsis/ARDS and prolonged hosp  . Depression   . Hyperlipidemia   .  Mitral regurgitation 10/2011   severe, s/p min invasive repair and annuloplasty  . Mitral valve prolapse   . Neuromuscular disorder (HCC)    numbness in hands and feet  . Osteoarthritis   . Osteoporosis    DEXA @ [facility] 11/2012: -3.5, prior tx Reclast, last in 2011, rec change to Prolia  . Overactive bladder   . Pleural effusion, left 11/11/2011  . Vitamin D deficiency    Past Surgical History:  Procedure Laterality Date  . CARDIAC CATHETERIZATION     Jan 2013  . KNEE ARTHROSCOPY Right   . MITRAL VALVE REPAIR  10/15/2011   Procedure: MINIMALLY INVASIVE MITRAL VALVE REPAIR (MVR);  Surgeon: Rexene Alberts, MD;  Location: [facility];  Service: Open Heart Surgery;  Laterality: Right;  . TRANSESOPHAGEAL ECHOCARDIOGRAM  7/12  . US ECHOCARDIOGRAPHY  6/12  . VENTRICULOPERITONEAL SHUNT  1971    Allergies  Allergen Reactions  . Compazine Other (See Comments)    "eyes get buggy"    Outpatient Encounter Medications as of 02/10/2018  Medication Sig  . atorvastatin (LIPITOR) 10 MG tablet TAKE ONE TABLET BY MOUTH DAILY  . Calcium Carbonate-Vitamin D (CALCIUM 600/VITAMIN D PO) Take 1 tablet by mouth daily. Calcium 600 - vitamin D3 200  . clonazePAM (KLONOPIN) 0.25 MG disintegrating tablet Take 1/2 tablet in AM and 1 tablet at bedtime.  Marland Kitchen denosumab (PROLIA) 60 MG/ML SOLN injection Inject 60 mg into the skin every 6 (six) months. Administer in upper arm, thigh, or abdomen  . escitalopram (LEXAPRO) 10 MG tablet Take 1 tablet (10  mg total) by mouth daily.  Marland Kitchen linaclotide (LINZESS) 145 MCG CAPS capsule TAKE ONE CAPSULE BY MOUTH DAILY BEFORE BREAKFAST  . Menthol, Topical Analgesic, (BIOFREEZE) 4 % GEL Apply 1 application topically 2 (two) times daily. Apply topically BID to sore areas  . mirabegron ER (MYRBETRIQ) 50 MG TB24 tablet Take 50 mg by mouth daily.  Marland Kitchen NUTRITIONAL SUPPLEMENT LIQD Take 120 mLs by mouth 2 (two) times daily. MedPass   . polyvinyl alcohol (LIQUIFILM TEARS) 1.4 % ophthalmic solution Place 1  drop into both eyes 2 (two) times daily.  . White Petrolatum-Mineral Oil (LUBRIFRESH P.M. OP) Apply 1 application to eye at bedtime. Apply to right eye  . [DISCONTINUED] imipramine (TOFRANIL) 25 MG tablet Take 25 mg by mouth at bedtime.   . [DISCONTINUED] metoprolol tartrate (LOPRESSOR) 12.5 mg TABS Take 12.5 mg by mouth 2 (two) times daily.  . [DISCONTINUED] mirabegron ER (MYRBETRIQ) 25 MG TB24 tablet Take 25 mg by mouth 2 (two) times daily.  . [DISCONTINUED] polysaccharide iron (NIFEREX) 150 MG CAPS capsule Take 1 capsule (150 mg total) by mouth daily.  . [DISCONTINUED] pravastatin (PRAVACHOL) 80 MG tablet Take 80 mg by mouth daily.   No facility-administered encounter medications on file as of 02/10/2018.     Review of Systems  GENERAL: No change in appetite, no fatigue, no weight changes, no fever, chills or weakness MOUTH and THROAT: Denies oral discomfort, gingival pain or bleeding, pain from teeth or hoarseness   RESPIRATORY: no cough, SOB, DOE, wheezing, hemoptysis CARDIAC: No chest pain, edema or palpitations GI: No abdominal pain, +constipation GU: + incontinence of bladder PSYCHIATRIC: Denies feelings of depression or anxiety. No report of hallucinations, insomnia, paranoia, or agitation   Immunization History  Administered Date(s) Administered  . Influenza Split 08/11/2012  . Influenza,inj,Quad PF,6+ Mos 05/14/2013, 07/19/2014, 06/29/2015, 07/01/2016  . Influenza-Unspecified 06/03/2011, 06/09/2017  . PPD Test 01/23/2018  . Pneumococcal Polysaccharide-23 11/23/2011   Pertinent  Health Maintenance Due  Topic Date Due  . PAP SMEAR  12/02/1978  . MAMMOGRAM  06/20/2013  . COLONOSCOPY  07/27/2014  . INFLUENZA VACCINE  04/09/2018   Fall Risk  01/22/2018 11/24/2017 06/29/2015 11/11/2012  Falls in the past year? Yes Yes Yes No  Number falls in past yr: 1 2 or more 2 or more -  Injury with Fall? Yes Yes Yes -  Comment - - bruising  -  Risk Factor Category  - High Fall Risk High  Fall Risk -  Risk for fall due to : - - Impaired balance/gait;Impaired mobility;History of fall(s) Impaired balance/gait;Impaired mobility  Follow up - Falls evaluation completed - -      Vitals:   02/10/18 0937  BP: 102/75  Pulse: 62  Resp: 17  Temp: 98.1 F (36.7 C)  TempSrc: Oral  SpO2: 96%  Weight: 89 lb 3.2 oz (40.5 kg)  Height: 4\' 11"  (1.499 m)   Body mass index is 18.02 kg/m.  Physical Exam  GENERAL APPEARANCE:  In no acute distress. Thin SKIN:  Skin is warm and dry.  MOUTH and THROAT: Lips are without lesions. Oral mucosa is moist and without lesions. Tongue is normal in shape, size, and color and without lesions RESPIRATORY: Breathing is even & unlabored, BS CTAB CARDIAC: RRR, no murmur,no extra heart sounds, no edema GI: Abdomen soft, normal BS, no masses, no tenderness EXTREMITIES:  Able to move X 4 extremities PSYCHIATRIC: Alert and oriented X 3. Affect and behavior are appropriate   Labs reviewed: Recent Labs  12/12/17 01/20/18  NA 141 144  K 4.3 3.9  BUN 18 20  CREATININE 0.5 0.5   Recent Labs    12/12/17  AST 15  ALT 9  ALKPHOS 56   Recent Labs    12/12/17  WBC 6.4  NEUTROABS 4  HGB 12.5  HCT 39  PLT 185   Lab Results  Component Value Date   TSH 1.15 03/02/2015   Lab Results  Component Value Date   HGBA1C 5.5 10/11/2011   Lab Results  Component Value Date   CHOL 150 12/12/2017   HDL 38 12/12/2017   LDLCALC 101 12/12/2017   LDLDIRECT 165.0 05/14/2013   TRIG 54 12/12/2017   CHOLHDL 4 11/06/2015    Assessment/Plan   1. Hyperlipidemia, unspecified hyperlipidemia type - continue Atorvastatin 10 mg daily Lab Results  Component Value Date   CHOL 150 12/12/2017   HDL 38 12/12/2017   LDLCALC 101 12/12/2017   LDLDIRECT 165.0 05/14/2013   TRIG 54 12/12/2017   CHOLHDL 4 11/06/2015     2. OAB (overactive bladder) - continue Myrbetriq ER 50 mg daily   3. Dry eyes - continue Lubrifresh ointment @ HS and Liquitears 1 gtt to  both eyes BID   4. Osteoporosis, unspecified osteoporosis type, unspecified pathological fracture presence - for Prolia injection on 02/20/18, continue Calcium 600-Vitamin D3 200  1 tab daily, fall precautions   5. Depression, recurrent ([place]) - continue Escitalopram 10 mg daily, followed up by [facility] NP   6. Anxiety - mood is stable, continue Clonazepam 0.125 mg Q AM and 0.25 mg Q HS, followed up by [facility] NP  7. Chronic constipation - will add prunes 2 pieces on breakfast trays per request, continue Linzess 145 mcg 1 capsule daily     Family/ staff Communication: Discussed plan of care with resident.  Labs/tests ordered:  None  Goals of care:   Long-term care   Durenda Age, NP [facility] [phone] (Monday-Friday 8:00 a.m. - 5:00 p.m.) [phone] (after hours)

## 2018-02-12 ENCOUNTER — Other Ambulatory Visit: Payer: Self-pay

## 2018-02-12 MED ORDER — CLONAZEPAM 0.25 MG PO TBDP: ORAL_TABLET | ORAL | 0 refills | Status: DC

## 2018-02-12 NOTE — Telephone Encounter (Signed)
Nurse requested refill for clonazepam.  Durenda Age, NP wrote Rx and gave to nurse.

## 2018-02-13 DIAGNOSIS — M25511 Pain in right shoulder: Secondary | ICD-10-CM | POA: Diagnosis not present

## 2018-02-13 DIAGNOSIS — M25512 Pain in left shoulder: Secondary | ICD-10-CM | POA: Diagnosis not present

## 2018-02-13 DIAGNOSIS — G252 Other specified forms of tremor: Secondary | ICD-10-CM | POA: Diagnosis not present

## 2018-02-13 DIAGNOSIS — Z85841 Personal history of malignant neoplasm of brain: Secondary | ICD-10-CM | POA: Diagnosis not present

## 2018-02-13 DIAGNOSIS — R278 Other lack of coordination: Secondary | ICD-10-CM | POA: Diagnosis not present

## 2018-02-16 DIAGNOSIS — M25511 Pain in right shoulder: Secondary | ICD-10-CM | POA: Diagnosis not present

## 2018-02-16 DIAGNOSIS — M25512 Pain in left shoulder: Secondary | ICD-10-CM | POA: Diagnosis not present

## 2018-02-16 DIAGNOSIS — G252 Other specified forms of tremor: Secondary | ICD-10-CM | POA: Diagnosis not present

## 2018-02-16 DIAGNOSIS — Z85841 Personal history of malignant neoplasm of brain: Secondary | ICD-10-CM | POA: Diagnosis not present

## 2018-02-16 DIAGNOSIS — R278 Other lack of coordination: Secondary | ICD-10-CM | POA: Diagnosis not present

## 2018-02-17 DIAGNOSIS — M25512 Pain in left shoulder: Secondary | ICD-10-CM | POA: Diagnosis not present

## 2018-02-17 DIAGNOSIS — G252 Other specified forms of tremor: Secondary | ICD-10-CM | POA: Diagnosis not present

## 2018-02-17 DIAGNOSIS — R278 Other lack of coordination: Secondary | ICD-10-CM | POA: Diagnosis not present

## 2018-02-17 DIAGNOSIS — M25511 Pain in right shoulder: Secondary | ICD-10-CM | POA: Diagnosis not present

## 2018-02-17 DIAGNOSIS — Z85841 Personal history of malignant neoplasm of brain: Secondary | ICD-10-CM | POA: Diagnosis not present

## 2018-02-18 DIAGNOSIS — M25512 Pain in left shoulder: Secondary | ICD-10-CM | POA: Diagnosis not present

## 2018-02-18 DIAGNOSIS — M25511 Pain in right shoulder: Secondary | ICD-10-CM | POA: Diagnosis not present

## 2018-02-18 DIAGNOSIS — Z85841 Personal history of malignant neoplasm of brain: Secondary | ICD-10-CM | POA: Diagnosis not present

## 2018-02-18 DIAGNOSIS — G252 Other specified forms of tremor: Secondary | ICD-10-CM | POA: Diagnosis not present

## 2018-02-18 DIAGNOSIS — R278 Other lack of coordination: Secondary | ICD-10-CM | POA: Diagnosis not present

## 2018-02-19 DIAGNOSIS — M25512 Pain in left shoulder: Secondary | ICD-10-CM | POA: Diagnosis not present

## 2018-02-19 DIAGNOSIS — R278 Other lack of coordination: Secondary | ICD-10-CM | POA: Diagnosis not present

## 2018-02-19 DIAGNOSIS — M25511 Pain in right shoulder: Secondary | ICD-10-CM | POA: Diagnosis not present

## 2018-02-19 DIAGNOSIS — Z85841 Personal history of malignant neoplasm of brain: Secondary | ICD-10-CM | POA: Diagnosis not present

## 2018-02-19 DIAGNOSIS — G252 Other specified forms of tremor: Secondary | ICD-10-CM | POA: Diagnosis not present

## 2018-02-24 ENCOUNTER — Ambulatory Visit: Payer: Medicare Other

## 2018-03-03 DIAGNOSIS — F329 Major depressive disorder, single episode, unspecified: Secondary | ICD-10-CM | POA: Diagnosis not present

## 2018-03-03 DIAGNOSIS — F419 Anxiety disorder, unspecified: Secondary | ICD-10-CM | POA: Diagnosis not present

## 2018-03-04 ENCOUNTER — Other Ambulatory Visit: Payer: Self-pay

## 2018-03-04 ENCOUNTER — Ambulatory Visit: Payer: Medicare Other | Admitting: Neurology

## 2018-03-04 MED ORDER — CLONAZEPAM 0.25 MG PO TBDP: ORAL_TABLET | ORAL | 0 refills | Status: DC

## 2018-03-04 NOTE — Telephone Encounter (Signed)
Hard script was written by Durenda Age, NP.  Bonney Leitz, [place] faxed script to [facility], drew a diagonal line through the original Rx and put in pharmacy tote.  Nurse was informed.

## 2018-03-10 ENCOUNTER — Inpatient Hospital Stay: Admission: RE | Admit: 2018-03-10 | Payer: Medicare Other | Source: Ambulatory Visit

## 2018-03-16 ENCOUNTER — Other Ambulatory Visit: Payer: Self-pay

## 2018-03-16 MED ORDER — CLONAZEPAM 0.125 MG PO TBDP: 0.1250 mg | ORAL_TABLET | ORAL | 0 refills | Status: DC

## 2018-03-16 NOTE — Telephone Encounter (Signed)
Durenda Age, NP wrote a hard script and gave to [place], the [employer].

## 2018-03-24 ENCOUNTER — Inpatient Hospital Stay: Admission: RE | Admit: 2018-03-24 | Payer: Medicare Other | Source: Ambulatory Visit

## 2018-03-31 ENCOUNTER — Encounter: Payer: Self-pay | Admitting: Neurology

## 2018-03-31 ENCOUNTER — Ambulatory Visit (INDEPENDENT_AMBULATORY_CARE_PROVIDER_SITE_OTHER): Payer: Medicare Other | Admitting: Neurology

## 2018-03-31 VITALS — BP 88/56 | HR 88 | Ht <= 58 in | Wt 86.0 lb

## 2018-03-31 DIAGNOSIS — R270 Ataxia, unspecified: Secondary | ICD-10-CM

## 2018-03-31 DIAGNOSIS — G252 Other specified forms of tremor: Secondary | ICD-10-CM

## 2018-03-31 NOTE — Progress Notes (Signed)
NEUROLOGY FOLLOW UP OFFICE NOTE  MELENIE MINNIEAR [id number]  HISTORY OF PRESENT ILLNESS: Michaela [name] is a 62 year old  female with depression, anxiety, neurogenic bladder and history of cerebral astrocytoma who follows up for tremor and ataxia.  She is accompanied by her sister who supplements history.   UPDATE: She is now living at [facility].  For tremor, she is taking clonazepam 0.125mg  in AM and 0.25mg  at bedtime. However, the staff is often late in giving her the morning dose.  She reports on a couple of occasions that she had hallucinations on higher dosing.  Tremor is unchanged.    HISTORY: She was diagnosed with a cerebellar astrocytoma as a child and underwent radiation and shunt placement at age 84.  She has residual spasticity, cerebellar dysfunction and neurogenic bladder.  She is nonambulatory at baseline.  However, she is able to transition herself from the wheelchair to the bathroom.  In 2013, she underwent mitral valve repair.  Since then, she reports that her shaking has gotten worse.  The shaking is episodic and usually occurs in the morning when she gets up.  Otherwise, she is unaware of any triggers.  Nothing seems to calm it, it just resolves spontaneously.  It caused her anxiety and she was started on Lexapro, which has helped the anxiety but not the shaking.  When it occurs getting out of bed, she has had falls.  Otherwise, she reports no new symptoms.   Most recent brain image available is a head CT from 11/25/11, which revealed white matter hyperintensities in the bilateral parietal lobes and right lateral ventricular shunt but no acute findings.  Mental status: alert and oriented to person, place, and time, recent and remote memory intact, fund of knowledge intact, attention and concentration intact, speech fluent and not dysarthric, language intact. Cranial nerves:  Left eye adducted on primary gaze, unable to abduct left eye, saccadic eye  movements with tracking nystagmus.  Otherwise, CN II-XII intact.  Bulk & Tone: decreased bulk, increased tone, no fasciculations.  Motor:  5/5 throughout.  Sensation: Light touch sensation intact.  Deep Tendon Reflexes:  2+ throughout. Finger to nose testing:  Dysmetria bilaterally.  Gait:  Nonambulatory.  PAST MEDICAL HISTORY: Past Medical History:  Diagnosis Date  . Astrocytoma brain tumor ([place]) 1971   astrocytoma treated with radiation  . C. difficile colitis 02/6293   complicated by sepsis/ARDS and prolonged hosp  . Depression   . Hyperlipidemia   . Mitral regurgitation 10/2011   severe, s/p min invasive repair and annuloplasty  . Mitral valve prolapse   . Neuromuscular disorder (H[facility])    numbness in hands and feet  . Osteoarthritis   . Osteoporosis    DEXA @ [facility] 11/2012: -3.5, prior tx Reclast, last in 2011, rec change to Prolia  . Overactive bladder   . Pleural effusion, left 11/11/2011  . Vitamin D deficiency     MEDICATIONS: Current Outpatient Medications on File Prior to Visit  Medication Sig Dispense Refill  . atorvastatin (LIPITOR) 10 MG tablet TAKE ONE TABLET BY MOUTH DAILY 90 tablet 1  . Calcium Carbonate-Vitamin D (CALCIUM 600/VITAMIN D PO) Take 1 tablet by mouth daily. Calcium 600 - vitamin D3 200    . clonazepam (KLONOPIN) 0.125 MG disintegrating tablet Take 1-2 tablets (0.125-0.25 mg total) by mouth See admin instructions. Take 1 tablet to = 0.125 mg QAM, take 2 tablets to = 0.25 mg QPM. 90 tablet 0  . denosumab (PROLIA) 60 MG/ML  SOLN injection Inject 60 mg into the skin every 6 (six) months. Administer in upper arm, thigh, or abdomen 1 Syringe 0  . escitalopram (LEXAPRO) 10 MG tablet Take 1 tablet (10 mg total) by mouth daily. 90 tablet 1  . linaclotide (LINZESS) 145 MCG CAPS capsule TAKE ONE CAPSULE BY MOUTH DAILY BEFORE BREAKFAST 90 capsule 2  . Menthol, Topical Analgesic, (BIOFREEZE) 4 % GEL Apply 1 application topically 2 (two) times daily. Apply topically BID to sore  areas    . mirabegron ER (MYRBETRIQ) 50 MG TB24 tablet Take 50 mg by mouth daily.    Marland Kitchen NUTRITIONAL SUPPLEMENT LIQD Take 120 mLs by mouth 2 (two) times daily. MedPass     . polyvinyl alcohol (LIQUIFILM TEARS) 1.4 % ophthalmic solution Place 1 drop into both eyes 2 (two) times daily.    . White Petrolatum-Mineral Oil (LUBRIFRESH P.M. OP) Apply 1 application to eye at bedtime. Apply to right eye    . [DISCONTINUED] imipramine (TOFRANIL) 25 MG tablet Take 25 mg by mouth at bedtime.     . [DISCONTINUED] metoprolol tartrate (LOPRESSOR) 12.5 mg TABS Take 12.5 mg by mouth 2 (two) times daily.    . [DISCONTINUED] polysaccharide iron (NIFEREX) 150 MG CAPS capsule Take 1 capsule (150 mg total) by mouth daily. 30 each 0  . [DISCONTINUED] pravastatin (PRAVACHOL) 80 MG tablet Take 80 mg by mouth daily.     No current facility-administered medications on file prior to visit.     ALLERGIES: Allergies  Allergen Reactions  . Compazine Other (See Comments)    "eyes get buggy"    FAMILY HISTORY: Family History  Problem Relation Age of Onset  . Coronary artery disease Father 21       died MI  . Hyperlipidemia Mother   . Heart attack Maternal Grandfather     SOCIAL HISTORY: Social History   Socioeconomic History  . Marital status: Single    Spouse name: Not on file  . Number of children: 0  . Years of education: Not on file  . Highest education level: Not on file  Occupational History  . Occupation: disabled  Social Needs  . Financial resource strain: Not hard at all  . Food insecurity:    Worry: Never true    Inability: Never true  . Transportation needs:    Medical: No    Non-medical: No  Tobacco Use  . Smoking status: Former Smoker    Last attempt to quit: 09/09/1990    Years since quitting: 27.5  . Smokeless tobacco: Never Used  Substance and Sexual Activity  . Alcohol use: Not Currently    Alcohol/week: 0.0 oz    Comment: rarely  . Drug use: No  . Sexual activity: Not on file    Lifestyle  . Physical activity:    Days per week: 0 days    Minutes per session: 0 min  . Stress: Only a little  Relationships  . Social connections:    Talks on phone: Three times a week    Gets together: Three times a week    Attends religious service: Never    Active member of club or organization: Not on file    Attends meetings of clubs or organizations: Never    Relationship status: Never married  . Intimate partner violence:    Fear of current or ex partner: No    Emotionally abused: No    Physically abused: No    Forced sexual activity: No  Other Topics Concern  .  Not on file  Social History Narrative   [facility] in SNF.      Patient is right-handed. She is living at [facility] now.    REVIEW OF SYSTEMS: Constitutional: No fevers, chills, or sweats, no generalized fatigue, change in appetite Eyes: No visual changes, double vision, eye pain Ear, nose and throat: No hearing loss, ear pain, nasal congestion, sore throat Cardiovascular: No chest pain, palpitations Respiratory:  No shortness of breath at rest or with exertion, wheezes GastrointestinaI: No nausea, vomiting, diarrhea, abdominal pain, fecal incontinence Genitourinary:  incontinence Musculoskeletal:  No neck pain, back pain Integumentary: No rash, pruritus, skin lesions Neurological: as above Psychiatric: No depression, insomnia, anxiety Endocrine: No palpitations, fatigue, diaphoresis, mood swings, change in appetite, change in weight, increased thirst Hematologic/Lymphatic:  No purpura, petechiae. Allergic/Immunologic: no itchy/runny eyes, nasal congestion, recent allergic reactions, rashes  PHYSICAL EXAM: Vitals:   03/31/18 1100  BP: (!) 88/56  Pulse: 88  SpO2: 94%   General: No acute distress.  Patient appears well-groomed.  Head:  Normocephalic/atraumatic Eyes:  Fundi examined but not visualized Neck: supple, no paraspinal tenderness, full range of motion Heart:   Regular rate and rhythm Lungs:  Clear to auscultation bilaterally Back: No paraspinal tenderness Neurological Exam: Mental status: alert and oriented to person, place, and time, recent and remote memory intact, fund of knowledge intact, attention and concentration intact, speech fluent and not dysarthric, language intact. Cranial nerves:  Left eye adducted on primary gaze, unable to abduct left eye, saccadic eye movements with tracking nystagmus.  Otherwise, CN II-XII intact.  Bulk & Tone: decreased bulk, increased tone, no fasciculations.  Motor:  5/5 throughout.  Sensation: Light touch sensation intact.  Deep Tendon Reflexes:  2+ throughout. Finger to nose testing:  Dysmetria bilaterally.  Gait:  Nonambulatory.  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  I explained that cerebellar tremors are extremely difficult to treat and most medications are ineffective.  She has had some benefit with clonazepam, so we will try to optimize dosing.   PLAN: 1.  I have recommended that the 0.125mg  dose be given at 8 AM and continue 0.25mg  at bedtime.  If this is not effective, we will try increasing the morning dose to 0.25mg .  If she has hallucinations, then I would rather that she return to the previous dosing as side effects outweigh the benefits. 2.  Even though tremor has been stable, I would like to get an MRI of the brain with and without contrast as there was a dramatic increase in the tremor in 2013 following her heart valve surgery.  She will contact me later about scheduling. 3. Follow up in 6 months or sooner if needed.  25 minutes spent face to face with patient, over 50% spent discussing management.  Metta Clines, DO  CC:  Unice Cobble, MD

## 2018-03-31 NOTE — Patient Instructions (Signed)
1.  Take clonazepam 0.125mg  at 8 AM and 0.25mg  at bedtime.  If ineffective, contact me and we can try increasing the morning dose to 0.25mg  2.  When ready, contact me and we can get an MRI of the brain 3.  Follow up in 6 months or sooner if needed

## 2018-04-01 DIAGNOSIS — F329 Major depressive disorder, single episode, unspecified: Secondary | ICD-10-CM | POA: Diagnosis not present

## 2018-04-01 DIAGNOSIS — F419 Anxiety disorder, unspecified: Secondary | ICD-10-CM | POA: Diagnosis not present

## 2018-04-08 ENCOUNTER — Encounter: Payer: Self-pay | Admitting: Adult Health

## 2018-04-08 ENCOUNTER — Non-Acute Institutional Stay (SKILLED_NURSING_FACILITY): Payer: Medicare Other | Admitting: Adult Health

## 2018-04-08 DIAGNOSIS — F329 Major depressive disorder, single episode, unspecified: Secondary | ICD-10-CM

## 2018-04-08 DIAGNOSIS — F419 Anxiety disorder, unspecified: Secondary | ICD-10-CM | POA: Diagnosis not present

## 2018-04-08 DIAGNOSIS — K5909 Other constipation: Secondary | ICD-10-CM

## 2018-04-08 DIAGNOSIS — M810 Age-related osteoporosis without current pathological fracture: Secondary | ICD-10-CM | POA: Diagnosis not present

## 2018-04-08 DIAGNOSIS — F32A Depression, unspecified: Secondary | ICD-10-CM

## 2018-04-08 DIAGNOSIS — N3281 Overactive bladder: Secondary | ICD-10-CM

## 2018-04-08 NOTE — Progress Notes (Signed)
Location:  [facility] Room Number: 304-B Place of Service:  SNF (31) Provider:  Durenda Age, NP  Patient Care Team: Hendricks Limes, MD as PCP - General (Internal Medicine) Larey Dresser, MD as Referring Physician (Cardiology) Larey Dresser, MD (Cardiology) Medina-Vargas, Senaida Lange, NP as Nurse Practitioner (Internal Medicine)  Extended Emergency Contact Information Primary Emergency Contact: [name], [place] [address] [place] Phone: [phone] Relation: Sister Secondary Emergency Contact: Moore,Mickey Address: [address]          [place], [place] [address] [name] Phone: [phone] Mobile Phone: [phone] Relation: Mother  Code Status:  Full Code  Goals of care: Advanced Directive information Advanced Directives 01/22/2018  Does Patient Have a Medical Advance Directive? No  Type of Advance Directive -  Would patient like information on creating a medical advance directive? No - Patient declined  Pre-existing out of facility DNR order (yellow form or pink MOST form) -     Chief Complaint  Patient presents with  . Medical Management of Chronic Issues    The patient is seen for a routine [facility] visit    HPI:  Pt is a 62 y.o. female seen today for medical management of chronic diseases.  She is a long-term care resident of [facility].  She has a PMH of astrocytoma, mitral regurgitation, mitral valve prolapse, OA, and osteoporosis.      Past Medical History:  Diagnosis Date  . Astrocytoma brain tumor ([place]) 1971   astrocytoma treated with radiation  . C. difficile colitis 03/1695   complicated by sepsis/ARDS and prolonged hosp  . Depression   . Hyperlipidemia   . Mitral regurgitation 10/2011   severe, s/p min invasive repair and annuloplasty  . Mitral valve prolapse   . Neuromuscular disorder (HCC)    numbness in hands and feet  . Osteoarthritis    . Osteoporosis    DEXA @ [facility] 11/2012: -3.5, prior tx Reclast, last in 2011, rec change to Prolia  . Overactive bladder   . Pleural effusion, left 11/11/2011  . Vitamin D deficiency    Past Surgical History:  Procedure Laterality Date  . CARDIAC CATHETERIZATION     Jan 2013  . KNEE ARTHROSCOPY Right   . MITRAL VALVE REPAIR  10/15/2011   Procedure: MINIMALLY INVASIVE MITRAL VALVE REPAIR (MVR);  Surgeon: Rexene Alberts, MD;  Location: [facility];  Service: Open Heart Surgery;  Laterality: Right;  . TRANSESOPHAGEAL ECHOCARDIOGRAM  7/12  . US ECHOCARDIOGRAPHY  6/12  . VENTRICULOPERITONEAL SHUNT  1971    Allergies  Allergen Reactions  . Compazine Other (See Comments)    "eyes get buggy"    Outpatient Encounter Medications as of 04/08/2018  Medication Sig  . atorvastatin (LIPITOR) 10 MG tablet TAKE ONE TABLET BY MOUTH DAILY  . clonazepam (KLONOPIN) 0.125 MG disintegrating tablet Take 1-2 tablets (0.125-0.25 mg total) by mouth See admin instructions. Take 1 tablet to = 0.125 mg QAM, take 2 tablets to = 0.25 mg QPM.  . escitalopram (LEXAPRO) 10 MG tablet Take 1 tablet (10 mg total) by mouth daily.  Marland Kitchen linaclotide (LINZESS) 145 MCG CAPS capsule TAKE ONE CAPSULE BY MOUTH DAILY BEFORE BREAKFAST  . Menthol, Topical Analgesic, (BIOFREEZE) 4 % GEL Apply 1 application topically 2 (two) times daily. Apply topically BID to sore areas  . mirabegron ER (MYRBETRIQ) 50 MG TB24 tablet Take 50 mg by  mouth daily.  Marland Kitchen NUTRITIONAL SUPPLEMENT LIQD Take 120 mLs by mouth 2 (two) times daily. MedPass   . polyvinyl alcohol (LIQUIFILM TEARS) 1.4 % ophthalmic solution Place 1 drop into both eyes 2 (two) times daily.  . White Petrolatum-Mineral Oil (LUBRIFRESH P.M. OP) Apply 1 application to eye at bedtime. Apply to right eye  . denosumab (PROLIA) 60 MG/ML SOLN injection Inject 60 mg into the skin every 6 (six) months. Administer in upper arm, thigh, or abdomen  . [DISCONTINUED] Calcium Carbonate-Vitamin D (CALCIUM  600/VITAMIN D PO) Take 1 tablet by mouth daily. Calcium 600 - vitamin D3 200  . [DISCONTINUED] imipramine (TOFRANIL) 25 MG tablet Take 25 mg by mouth at bedtime.   . [DISCONTINUED] metoprolol tartrate (LOPRESSOR) 12.5 mg TABS Take 12.5 mg by mouth 2 (two) times daily.  . [DISCONTINUED] polysaccharide iron (NIFEREX) 150 MG CAPS capsule Take 1 capsule (150 mg total) by mouth daily.  . [DISCONTINUED] pravastatin (PRAVACHOL) 80 MG tablet Take 80 mg by mouth daily.   No facility-administered encounter medications on file as of 04/08/2018.     Review of Systems  GENERAL: No change in appetite, no fatigue, no weight changes, no fever, chills or weakness MOUTH and THROAT: Denies oral discomfort, gingival pain or bleeding, pain from teeth or hoarseness   RESPIRATORY: no cough, SOB, DOE, wheezing, hemoptysis CARDIAC: No chest pain, edema or palpitations GI: +constipation PSYCHIATRIC: Denies feelings of depression or anxiety. No report of hallucinations, insomnia, paranoia, or agitation   Immunization History  Administered Date(s) Administered  . Influenza Split 08/11/2012  . Influenza,inj,Quad PF,6+ Mos 05/14/2013, 07/19/2014, 06/29/2015, 07/01/2016  . Influenza-Unspecified 06/03/2011, 06/09/2017  . PPD Test 01/23/2018  . Pneumococcal Polysaccharide-23 11/23/2011  . Tdap 01/23/2018   Pertinent  Health Maintenance Due  Topic Date Due  . PAP SMEAR  12/02/1978  . MAMMOGRAM  06/20/2013  . COLONOSCOPY  07/27/2014  . INFLUENZA VACCINE  04/09/2018   Fall Risk  03/31/2018 01/22/2018 11/24/2017 06/29/2015 11/11/2012  Falls in the past year? Yes Yes Yes Yes No  Number falls in past yr: 2 or more 1 2 or more 2 or more -  Injury with Fall? Yes Yes Yes Yes -  Comment - - - bruising  -  Risk Factor Category  High Fall Risk - High Fall Risk High Fall Risk -  Risk for fall due to : - - - Impaired balance/gait;Impaired mobility;History of fall(s) Impaired balance/gait;Impaired mobility  Follow up - - Falls  evaluation completed - -      Vitals:   04/08/18 0806  BP: 132/85  Pulse: (!) 56  Resp: 16  Temp: 98 F (36.7 C)  TempSrc: Oral  SpO2: 99%  Weight: 87 lb 6.4 oz (39.6 kg)  Height: 4\' 8"  (1.422 m)   Body mass index is 19.59 kg/m.  Physical Exam  GENERAL APPEARANCE:  In no acute distress. SKIN:  Skin is warm and dry.  MOUTH and THROAT: Lips are without lesions. Oral mucosa is moist and without lesions. Tongue is normal in shape, size, and color and without lesions RESPIRATORY: Breathing is even & unlabored, BS CTAB CARDIAC: RRR, no murmur,no extra heart sounds, no edema GI: Abdomen soft, normal BS, no masses, no tenderness EXTREMITIES:  Able to move X 4 extremities PSYCHIATRIC: Alert and oriented X 3. Affect and behavior are appropriate   Labs reviewed: Recent Labs    12/12/17 01/20/18  NA 141 144  K 4.3 3.9  BUN 18 20  CREATININE 0.5 0.5  Recent Labs    12/12/17  AST 15  ALT 9  ALKPHOS 56   Recent Labs    12/12/17  WBC 6.4  NEUTROABS 4  HGB 12.5  HCT 39  PLT 185   Lab Results  Component Value Date   TSH 1.15 03/02/2015   Lab Results  Component Value Date   HGBA1C 5.5 10/11/2011   Lab Results  Component Value Date   CHOL 150 12/12/2017   HDL 38 12/12/2017   LDLCALC 101 12/12/2017   LDLDIRECT 165.0 05/14/2013   TRIG 54 12/12/2017   CHOLHDL 4 11/06/2015    Assessment/Plan  1. OAB (overactive bladder) - continue Myrbetriq ER 50 mg daily   2. Osteoporosis, unspecified osteoporosis type, unspecified pathological fracture presence - requested to be started on Prolia 60 mg/ml SQ Q 6 months   3. Chronic depression - mood is stable, continue Escitalopram 10 mg 1 tab daily   4. Chronic constipation - verbalized being constipated, will start Miralax 17 gm PO Q D PRN,  Continue Linzess 145 mcg 1 capsule daily   5. Anxiety disorder, unspecified type - mood is stable, continue Clonazepam 0.125 mg 1 tab Q AM and 2 tabs = 0.25 mg Q HS    Family/  staff Communication: Discussed plan of care with the resident.  Labs/tests ordered:  None  Goals of care:   Long-term care.   Durenda Age, NP [facility] [phone] (Monday-Friday 8:00 a.m. - 5:00 p.m.) [phone] (after hours)

## 2018-04-13 ENCOUNTER — Ambulatory Visit
Admission: RE | Admit: 2018-04-13 | Discharge: 2018-04-13 | Disposition: A | Discharge status: Home / Self Care | Payer: Medicare Other | Source: Ambulatory Visit | Attending: Internal Medicine | Admitting: Internal Medicine

## 2018-04-13 DIAGNOSIS — Z1231 Encounter for screening mammogram for malignant neoplasm of breast: Secondary | ICD-10-CM | POA: Diagnosis not present

## 2018-04-22 ENCOUNTER — Telehealth: Payer: Self-pay | Admitting: Neurology

## 2018-04-22 NOTE — Telephone Encounter (Signed)
Patient POA called and states that she is back in town and wants to move forward with increasing the klonopin. She is at the [facility] on [address]

## 2018-04-23 DIAGNOSIS — R319 Hematuria, unspecified: Secondary | ICD-10-CM | POA: Diagnosis not present

## 2018-04-23 DIAGNOSIS — N390 Urinary tract infection, site not specified: Secondary | ICD-10-CM | POA: Diagnosis not present

## 2018-04-23 DIAGNOSIS — Z79899 Other long term (current) drug therapy: Secondary | ICD-10-CM | POA: Diagnosis not present

## 2018-04-23 NOTE — Telephone Encounter (Signed)
Called and LMOVM for [name] to return my call

## 2018-04-28 DIAGNOSIS — G252 Other specified forms of tremor: Secondary | ICD-10-CM | POA: Diagnosis not present

## 2018-04-28 DIAGNOSIS — H906 Mixed conductive and sensorineural hearing loss, bilateral: Secondary | ICD-10-CM | POA: Diagnosis not present

## 2018-04-29 DIAGNOSIS — G252 Other specified forms of tremor: Secondary | ICD-10-CM | POA: Diagnosis not present

## 2018-04-29 DIAGNOSIS — H906 Mixed conductive and sensorineural hearing loss, bilateral: Secondary | ICD-10-CM | POA: Diagnosis not present

## 2018-04-30 DIAGNOSIS — G252 Other specified forms of tremor: Secondary | ICD-10-CM | POA: Diagnosis not present

## 2018-04-30 DIAGNOSIS — H906 Mixed conductive and sensorineural hearing loss, bilateral: Secondary | ICD-10-CM | POA: Diagnosis not present

## 2018-05-01 ENCOUNTER — Telehealth: Payer: Self-pay | Admitting: Neurology

## 2018-05-01 NOTE — Telephone Encounter (Signed)
Patient's sister/ POA called and is needing to have her medication Clonazepam adjusted? She is needing an order sent to [facility]. Please Call. Thanks

## 2018-05-01 NOTE — Telephone Encounter (Signed)
Called and LMOVM for [name] that we will send increase to [employer]. Called [facility], spoke with [place]. She gave me fax# [phone]

## 2018-05-03 DIAGNOSIS — G252 Other specified forms of tremor: Secondary | ICD-10-CM | POA: Diagnosis not present

## 2018-05-03 DIAGNOSIS — H906 Mixed conductive and sensorineural hearing loss, bilateral: Secondary | ICD-10-CM | POA: Diagnosis not present

## 2018-05-04 DIAGNOSIS — H906 Mixed conductive and sensorineural hearing loss, bilateral: Secondary | ICD-10-CM | POA: Diagnosis not present

## 2018-05-04 DIAGNOSIS — G252 Other specified forms of tremor: Secondary | ICD-10-CM | POA: Diagnosis not present

## 2018-05-05 DIAGNOSIS — H906 Mixed conductive and sensorineural hearing loss, bilateral: Secondary | ICD-10-CM | POA: Diagnosis not present

## 2018-05-05 DIAGNOSIS — G252 Other specified forms of tremor: Secondary | ICD-10-CM | POA: Diagnosis not present

## 2018-05-06 DIAGNOSIS — H906 Mixed conductive and sensorineural hearing loss, bilateral: Secondary | ICD-10-CM | POA: Diagnosis not present

## 2018-05-06 DIAGNOSIS — G252 Other specified forms of tremor: Secondary | ICD-10-CM | POA: Diagnosis not present

## 2018-05-07 ENCOUNTER — Encounter: Payer: Self-pay | Admitting: Internal Medicine

## 2018-05-07 ENCOUNTER — Non-Acute Institutional Stay (SKILLED_NURSING_FACILITY): Payer: Medicare Other | Admitting: Internal Medicine

## 2018-05-07 DIAGNOSIS — K5909 Other constipation: Secondary | ICD-10-CM

## 2018-05-07 DIAGNOSIS — R1310 Dysphagia, unspecified: Secondary | ICD-10-CM | POA: Insufficient documentation

## 2018-05-07 DIAGNOSIS — H906 Mixed conductive and sensorineural hearing loss, bilateral: Secondary | ICD-10-CM | POA: Diagnosis not present

## 2018-05-07 DIAGNOSIS — G252 Other specified forms of tremor: Secondary | ICD-10-CM | POA: Diagnosis not present

## 2018-05-07 DIAGNOSIS — M810 Age-related osteoporosis without current pathological fracture: Secondary | ICD-10-CM | POA: Diagnosis not present

## 2018-05-07 NOTE — Assessment & Plan Note (Signed)
Speech Therapy evaluation 

## 2018-05-07 NOTE — Progress Notes (Signed)
NURSING HOME LOCATION:  [facility] ROOM NUMBER:  [id number]  CODE STATUS:  Full Code  PCP:  Hendricks Limes, MD  [place] [address]  This is a nursing facility follow up of chronic medical diagnoses.  Interim medical record and care since last [facility] visit was updated with review of diagnostic studies and change in clinical status since last visit were documented.  HPI: The patient is a permanent resident of the facility with diagnoses of cns astrocytoma , PAF, diastolic congestive heart failure, pituitary disorder, osteoporosis, neurogenic bladder,& anxiety disorder. Surgeries include mitral valve repair for cusp prolapse, ventriculoperitoneal shunting and knee arthroscopy. Labs are sufficiently current except for vitamin D level and TSH.  Review of systems: Constipation is a chronic issue.  She states her stools tend to be small but not pencil thin.  She is anxious to have a colonoscopy.  She states the last one was in 2007.  There were no abnormal findings.  Family history is negative for colon issues. She states that sometimes she "forgets how to swallow".  She states this will occur as often as once a week or as infrequently as once a month.   Does have occasional morning sweats.  She also describes occasional numbness in her fingers.  She is unable to clarify the laterality.  Constitutional: No fever, significant weight change, fatigue  Eyes: No redness, discharge, pain, new vision change ENT/mouth: No nasal congestion,  purulent discharge, earache, change in hearing, sore throat  Cardiovascular: No chest pain, palpitations, paroxysmal nocturnal dyspnea, claudication, edema  Respiratory: No cough, sputum production, hemoptysis, DOE, significant snoring, apnea   Gastrointestinal: No heartburn,definite dysphagia (see comments on swallowing above), abdominal pain, nausea /vomiting, rectal bleeding, melena Genitourinary: No dysuria,  hematuria, pyuria, incontinence, nocturia Musculoskeletal: No joint stiffness, joint swelling, weakness, pain Dermatologic: No rash, pruritus, change in appearance of skin Neurologic: No dizziness, headache, syncope, seizures Endocrine: No change in hair/skin/nails, excessive thirst, excessive hunger, excessive urination  Hematologic/lymphatic: No significant bruising, lymphadenopathy, abnormal bleeding Allergy/immunology: No itchy/watery eyes, significant sneezing, urticaria, angioedema  Physical exam:  Pertinent or positive findings: She is thin but appears adequately nourished.  She exhibits an almost constant rocking type tremor of the head, torso, and limbs.  She keeps the left eye closed for the most part.  She states that this is because she is "cross eyed".  She has no other visual symptoms.  Her speech pattern is choppy.  The sternocleidomastoid muscles are thin but tense.  The left nasolabial fold is decreased. The pedal pulses are difficult to discern because of the tremor of the extremities, particularly in the left lower extremity.  The right upper extremity is weaker than the left.  General appearance:  no acute distress, increased work of breathing is present.   Lymphatic: No lymphadenopathy about the head, neck, axilla. Eyes: No conjunctival inflammation or lid edema is present. There is no scleral icterus. Ears:  External ear exam shows no significant lesions or deformities.   Nose:  External nasal examination shows no deformity or inflammation. Nasal mucosa are pink and moist without lesions, exudates Oral exam:  Lips and gums are healthy appearing. There is no oropharyngeal erythema or exudate. Neck:  No thyromegaly, masses, tenderness noted.    Heart:  Normal rate and regular rhythm. S1 and S2 normal without gallop, murmur, click, rub .  Lungs: Chest clear to auscultation without wheezes, rhonchi, rales, rubs. Abdomen: Bowel sounds are normal. Abdomen is soft  and nontender  with no organomegaly, hernias, masses. GU: Deferred  Extremities:  No cyanosis, clubbing, edema  Neurologic exam : Balance, Rhomberg, finger to nose testing could not be completed due to clinical state Skin: Warm & dry w/o tenting. No significant lesions or rash.  See summary under each active problem in the Problem List with associated updated therapeutic plan

## 2018-05-07 NOTE — Assessment & Plan Note (Addendum)
She also describes somewhat chronic decreased caliber of stools without definite pencil thin stools. She is requesting colonoscopy follow-up TSH

## 2018-05-07 NOTE — Patient Instructions (Signed)
See assessment and plan under each diagnosis in the problem list and acutely for this visit 

## 2018-05-07 NOTE — Assessment & Plan Note (Addendum)
Update vitamin D level TSH Apparently Prolia not an option at this facility Speech Therapy to evaluate swallowing issues.  If no significant problems identified, consider Fosamax weekly

## 2018-05-08 DIAGNOSIS — F419 Anxiety disorder, unspecified: Secondary | ICD-10-CM | POA: Diagnosis not present

## 2018-05-08 DIAGNOSIS — G252 Other specified forms of tremor: Secondary | ICD-10-CM | POA: Diagnosis not present

## 2018-05-08 DIAGNOSIS — I4891 Unspecified atrial fibrillation: Secondary | ICD-10-CM | POA: Diagnosis not present

## 2018-05-08 DIAGNOSIS — N390 Urinary tract infection, site not specified: Secondary | ICD-10-CM | POA: Diagnosis not present

## 2018-05-08 DIAGNOSIS — H906 Mixed conductive and sensorineural hearing loss, bilateral: Secondary | ICD-10-CM | POA: Diagnosis not present

## 2018-05-08 DIAGNOSIS — I10 Essential (primary) hypertension: Secondary | ICD-10-CM | POA: Diagnosis not present

## 2018-05-08 DIAGNOSIS — F329 Major depressive disorder, single episode, unspecified: Secondary | ICD-10-CM | POA: Diagnosis not present

## 2018-05-08 DIAGNOSIS — E559 Vitamin D deficiency, unspecified: Secondary | ICD-10-CM | POA: Diagnosis not present

## 2018-05-08 LAB — VITAMIN D 25 HYDROXY (VIT D DEFICIENCY, FRACTURES): VIT D 25 HYDROXY: 27.28

## 2018-05-12 DIAGNOSIS — H16211 Exposure keratoconjunctivitis, right eye: Secondary | ICD-10-CM | POA: Diagnosis not present

## 2018-05-12 DIAGNOSIS — H04123 Dry eye syndrome of bilateral lacrimal glands: Secondary | ICD-10-CM | POA: Diagnosis not present

## 2018-05-13 ENCOUNTER — Telehealth: Payer: Self-pay | Admitting: Neurology

## 2018-05-13 NOTE — Telephone Encounter (Signed)
Patient's sister called and wanting to see if Dr. Tomi Likens was going to change her medication. She is at [facility] on [facility]. with Dr. Unice Cobble. She said that this was discussed at the last appt. Patient is shaking really bad. Please call her at [phone]. Thanks!

## 2018-05-14 NOTE — Telephone Encounter (Signed)
Called [facility], spoke with Hassan Rowan. I advised her I had faxed the increase of clonazepam on 8/24 and again on 05/04/18. She said she rcvd it and delivered it to the nursing staff. She will talk with the staff member she delivered it to. Lynnae Sandhoff, LMOVM advising her Helene Kelp has the increase, and she can call and speak with Hassan Rowan

## 2018-05-15 ENCOUNTER — Other Ambulatory Visit: Payer: Self-pay | Admitting: *Deleted

## 2018-05-15 ENCOUNTER — Telehealth: Payer: Self-pay | Admitting: Neurology

## 2018-05-15 MED ORDER — CLONAZEPAM 0.125 MG PO TBDP: 0.1250 mg | ORAL_TABLET | ORAL | 0 refills | Status: DC

## 2018-05-15 NOTE — Telephone Encounter (Signed)
I will fax the Rx.

## 2018-05-15 NOTE — Telephone Encounter (Signed)
Dylan from [facility] called and left a message about this patient stating that there was a change in medication for this patient and they needed the hard script copy for that. If you would please call them back at [phone] and ask for the [facility]. They did not leave their fax number. Thanks!

## 2018-05-15 NOTE — Telephone Encounter (Signed)
Both the patient's daughter Carolyne Littles [phone]) and Arrie Aran from [facility] have called and left voicemails stating they need a hard copy of the Klonopin prescription. Or you can send it over to the [facility] at fax: [phone]. Thanks!

## 2018-05-21 ENCOUNTER — Telehealth: Payer: Self-pay

## 2018-05-21 NOTE — Telephone Encounter (Signed)
Rcvd VM from [place] from [place]. She said Pt's sister had inquired about increasing Klonopin.  I called back to [phone], the return number she left, and LMOVM advising her of the conversation I had with Hassan Rowan on 05/14/18.

## 2018-05-22 ENCOUNTER — Other Ambulatory Visit: Payer: Self-pay

## 2018-05-22 MED ORDER — CLONAZEPAM 0.125 MG PO TBDP: 0.1250 mg | ORAL_TABLET | ORAL | 0 refills | Status: DC

## 2018-05-22 NOTE — Telephone Encounter (Signed)
Durenda Age, NP gave a hard script to Ward Chatters, the nurse for the patient.

## 2018-05-27 DIAGNOSIS — R278 Other lack of coordination: Secondary | ICD-10-CM | POA: Diagnosis not present

## 2018-05-27 DIAGNOSIS — Z85841 Personal history of malignant neoplasm of brain: Secondary | ICD-10-CM | POA: Diagnosis not present

## 2018-05-28 DIAGNOSIS — R278 Other lack of coordination: Secondary | ICD-10-CM | POA: Diagnosis not present

## 2018-05-28 DIAGNOSIS — Z85841 Personal history of malignant neoplasm of brain: Secondary | ICD-10-CM | POA: Diagnosis not present

## 2018-05-29 DIAGNOSIS — R278 Other lack of coordination: Secondary | ICD-10-CM | POA: Diagnosis not present

## 2018-05-29 DIAGNOSIS — Z85841 Personal history of malignant neoplasm of brain: Secondary | ICD-10-CM | POA: Diagnosis not present

## 2018-06-01 ENCOUNTER — Non-Acute Institutional Stay (SKILLED_NURSING_FACILITY): Payer: Medicare Other | Admitting: Adult Health

## 2018-06-01 ENCOUNTER — Encounter: Payer: Self-pay | Admitting: Adult Health

## 2018-06-01 DIAGNOSIS — Z124 Encounter for screening for malignant neoplasm of cervix: Secondary | ICD-10-CM | POA: Diagnosis not present

## 2018-06-01 DIAGNOSIS — Z1231 Encounter for screening mammogram for malignant neoplasm of breast: Secondary | ICD-10-CM | POA: Diagnosis not present

## 2018-06-01 DIAGNOSIS — Z1239 Encounter for other screening for malignant neoplasm of breast: Secondary | ICD-10-CM

## 2018-06-01 DIAGNOSIS — R278 Other lack of coordination: Secondary | ICD-10-CM | POA: Diagnosis not present

## 2018-06-01 DIAGNOSIS — N3281 Overactive bladder: Secondary | ICD-10-CM | POA: Diagnosis not present

## 2018-06-01 DIAGNOSIS — Z1211 Encounter for screening for malignant neoplasm of colon: Secondary | ICD-10-CM

## 2018-06-01 DIAGNOSIS — R634 Abnormal weight loss: Secondary | ICD-10-CM | POA: Diagnosis not present

## 2018-06-01 DIAGNOSIS — Z13820 Encounter for screening for osteoporosis: Secondary | ICD-10-CM

## 2018-06-01 DIAGNOSIS — Z85841 Personal history of malignant neoplasm of brain: Secondary | ICD-10-CM | POA: Diagnosis not present

## 2018-06-01 NOTE — Progress Notes (Signed)
Location:  [place] Room Number: 304-B Place of Service:  SNF (31) Provider:  Durenda Age, NP  Patient Care Team: Hendricks Limes, MD as PCP - General (Internal Medicine) Larey Dresser, MD as Referring Physician (Cardiology) Larey Dresser, MD (Cardiology) Medina-Vargas, Senaida Lange, NP as Nurse Practitioner (Internal Medicine)  Extended Emergency Contact Information Primary Emergency Contact: [name], [place] [address] [place] Phone: [phone] Relation: Sister  Code Status:  Full Code  Goals of care: Advanced Directive information Advanced Directives 01/22/2018  Does Patient Have a Medical Advance Directive? No  Type of Advance Directive -  Would patient like information on creating a medical advance directive? No - Patient declined  Pre-existing out of facility DNR order (yellow form or pink MOST form) -     Chief Complaint  Patient presents with  . Medical Management of Chronic Issues    The patient is seen for a routine [facility] visit    HPI:  Pt is a 62 y.o. female seen today for medical management of chronic diseases.  She is a long-term care resident of [facility].  She has a PMH of astrocytoma, mitral regurgitation, mitral valve prolapse, OA, and osteoporosis. She was seen today. She requested to have colonoscopy scheduled. Review of orders revealed that pap smear, colonoscopy, DEXA scan and mammogram were ordered on 4/19. She said that she has stomachache whenever she takes medpass. Latest weight is 86.4 lbs Body mass index is 19.37 kg/m.   Past Medical History:  Diagnosis Date  . 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 C. difficile colitis 11/7900   complicated by sepsis/ARDS and prolonged hosp  . Depression   . Hyperlipidemia   . Mitral regurgitation 10/2011   severe, s/p min invasive repair and annuloplasty  . Mitral  valve prolapse   . Neuromuscular disorder (HCC)    numbness in hands and feet  . Osteoarthritis   . Osteoporosis    DEXA @ [facility] 11/2012: -3.5, prior tx Reclast, last in 2011, rec change to Prolia  . Overactive bladder   . Pleural effusion, left 11/11/2011  . Vitamin D deficiency    Past Surgical History:  Procedure Laterality Date  . CARDIAC CATHETERIZATION     Jan 2013  . KNEE ARTHROSCOPY Right   . MITRAL VALVE REPAIR  10/15/2011   Procedure: MINIMALLY INVASIVE MITRAL VALVE REPAIR (MVR);  Surgeon: Rexene Alberts, MD;  Location: [facility];  Service: Open Heart Surgery;  Laterality: Right;  . TRANSESOPHAGEAL ECHOCARDIOGRAM  7/12  . US ECHOCARDIOGRAPHY  6/12  . VENTRICULOPERITONEAL SHUNT  1971    Allergies  Allergen Reactions  . Compazine Other (See Comments)    "eyes get buggy"    Outpatient Encounter Medications as of 06/01/2018  Medication Sig  . atorvastatin (LIPITOR) 10 MG tablet TAKE ONE TABLET BY MOUTH DAILY  . cholecalciferol (VITAMIN D) 1000 units tablet Take 1,000 Units by mouth daily.  . clonazepam (KLONOPIN) 0.125 MG disintegrating tablet Take 1-2 tablets (0.125-0.25 mg total) by mouth See admin instructions. Take 1 tablet to = 0.125 mg QAM, take 2 tablets to = 0.25 mg QPM.  . denosumab (PROLIA) 60 MG/ML SOLN injection Inject 60 mg into the skin every 6 (six) months. Administer in upper arm, thigh, or abdomen  . escitalopram (LEXAPRO) 10 MG tablet Take 1 tablet (10 mg total) by mouth daily.  Marland Kitchen  linaclotide (LINZESS) 145 MCG CAPS capsule TAKE ONE CAPSULE BY MOUTH DAILY BEFORE BREAKFAST  . Menthol, Topical Analgesic, (BIOFREEZE) 4 % GEL Apply 1 application topically 2 (two) times daily. Apply topically BID to sore areas  . mirabegron ER (MYRBETRIQ) 50 MG TB24 tablet Take 50 mg by mouth daily.  Marland Kitchen NUTRITIONAL SUPPLEMENT LIQD Take 120 mLs by mouth 2 (two) times daily. MedPass   . polyethylene glycol (MIRALAX / GLYCOLAX) packet Take 17 g by mouth daily as needed for mild constipation  or moderate constipation.  . polyvinyl alcohol (LIQUIFILM TEARS) 1.4 % ophthalmic solution Place 1 drop into both eyes 4 (four) times daily.   . White Petrolatum-Mineral Oil (LUBRIFRESH P.M. OP) Apply 1 application to eye at bedtime. Apply to right eye   No facility-administered encounter medications on file as of 06/01/2018.     Review of Systems  GENERAL: No change in appetite, no fatigue, no weight changes, no fever, chills or weakness MOUTH and THROAT: Denies oral discomfort, gingival pain or bleeding, pain from teeth or hoarseness   RESPIRATORY: no cough, SOB, DOE, wheezing, hemoptysis CARDIAC: No chest pain, edema or palpitations GI: No abdominal pain, diarrhea, constipation, heart burn, nausea or vomiting GU: Denies dysuria, frequency, hematuria, incontinence, or discharge PSYCHIATRIC: Denies feelings of depression or anxiety. No report of hallucinations, insomnia, paranoia, or agitation    Immunization History  Administered Date(s) Administered  . Influenza Split 08/11/2012  . Influenza,inj,Quad PF,6+ Mos 05/14/2013, 07/19/2014, 06/29/2015, 07/01/2016  . Influenza-Unspecified 06/03/2011, 06/09/2017  . PPD Test 01/23/2018  . Pneumococcal Polysaccharide-23 11/23/2011  . Tdap 01/23/2018   Pertinent  Health Maintenance Due  Topic Date Due  . PAP SMEAR  12/02/1978  . COLONOSCOPY  07/27/2014  . INFLUENZA VACCINE  04/09/2018  . MAMMOGRAM  04/13/2020   Fall Risk  03/31/2018 01/22/2018 11/24/2017 06/29/2015 11/11/2012  Falls in the past year? Yes Yes Yes Yes No  Number falls in past yr: 2 or more 1 2 or more 2 or more -  Injury with Fall? Yes Yes Yes Yes -  Comment - - - bruising  -  Risk Factor Category  High Fall Risk - High Fall Risk High Fall Risk -  Risk for fall due to : - - - Impaired balance/gait;Impaired mobility;History of fall(s) Impaired balance/gait;Impaired mobility  Follow up - - Falls evaluation completed - -     Vitals:   06/01/18 1001  BP: 95/61  Pulse: 93    Resp: 17  Temp: (!) 97 F (36.1 C)  TempSrc: Oral  Weight: 86 lb 6.4 oz (39.2 kg)  Height: 4\' 8"  (1.422 m)   Body mass index is 19.37 kg/m.  Physical Exam  GENERAL APPEARANCE:  In no acute distress.  SKIN:  Skin is warm and dry. MOUTH and THROAT: Lips are without lesions. Oral mucosa is moist and without lesions. Tongue is normal in shape, size, and color and without lesions RESPIRATORY: Breathing is even & unlabored, BS CTAB CARDIAC: RRR, no murmur,no extra heart sounds, no edema GI: Abdomen soft, normal BS, no masses, no tenderness EXTREMITIES:  Able to move X 4 extremities NEUROLOGICAL: + tremors. Speech is clear PSYCHIATRIC: Alert and oriented X 3. Affect and behavior are appropriate  Labs reviewed: Recent Labs    12/12/17 01/20/18  NA 141 144  K 4.3 3.9  BUN 18 20  CREATININE 0.5 0.5   Recent Labs    12/12/17  AST 15  ALT 9  ALKPHOS 56   Recent Labs  12/12/17  WBC 6.4  NEUTROABS 4  HGB 12.5  HCT 39  PLT 185   Lab Results  Component Value Date   TSH 1.15 03/02/2015   Lab Results  Component Value Date   HGBA1C 5.5 10/11/2011   Lab Results  Component Value Date   CHOL 150 12/12/2017   HDL 38 12/12/2017   LDLCALC 101 12/12/2017   LDLDIRECT 165.0 05/14/2013   TRIG 54 12/12/2017   CHOLHDL 4 11/06/2015    Assessment/Plan  1. Loss of weight - Body mass index is 19.37 kg/m. will discontinue Medpass due to intolerance, start Magic cup BID Last Weight  Most recent update: 06/01/2018 10:20 AM   Weight  39.2 kg (86 lb 6.4 oz)            2. Colon cancer screening - schedule for colonoscopy   3. Breast cancer screening - schedule for mammogram   4. Osteoporosis screening - schedule for DEXA scan   5. Cervical cancer screening - schedule for pap smear   6. OAB (overactive bladder) - continue Myrbetriq ER 50 mg 1 tab daily    Family/ staff Communication:  Discussed plan of care with resident.  Labs/tests ordered:  Colonoscopy,  mammogram, DEXA scan, Pap smear  Goals of care:   Long-term care.   Durenda Age, NP [facility] [phone] (Monday-Friday 8:00 a.m. - 5:00 p.m.) [phone] (after hours)

## 2018-06-02 DIAGNOSIS — R278 Other lack of coordination: Secondary | ICD-10-CM | POA: Diagnosis not present

## 2018-06-02 DIAGNOSIS — Z85841 Personal history of malignant neoplasm of brain: Secondary | ICD-10-CM | POA: Diagnosis not present

## 2018-06-05 ENCOUNTER — Telehealth: Payer: Self-pay | Admitting: Neurology

## 2018-06-05 NOTE — Telephone Encounter (Signed)
Patient sister wants to know if she can come and pick up a RX for the Klonopin where she takes two in the AM and two in the PM. She states that the nursing home is not able to get in touch with [place]. Please call Carolyne Littles (patient sister ) at [phone]   She also states that they use the [employer]

## 2018-06-05 NOTE — Telephone Encounter (Signed)
I called [facility] earlier today and spoke with Dawn. She gave me another fax# of [phone], since the previous faxes have not been rcvd. We did receive a call in August indicating that Helene Kelp was only receiving the first page of the fax, that is when I called and spoke with [place]. I have attempted to fax the Rx on many occasions, and was told at one time by Guerry Minors, it was rcvd and she personally handed it to the clinical staff. I call and LMOVM advising [name] of all this, and that I am personally taking the document to [employer].

## 2018-06-09 DIAGNOSIS — F329 Major depressive disorder, single episode, unspecified: Secondary | ICD-10-CM | POA: Diagnosis not present

## 2018-06-09 DIAGNOSIS — F419 Anxiety disorder, unspecified: Secondary | ICD-10-CM | POA: Diagnosis not present

## 2018-06-10 NOTE — Progress Notes (Signed)
Since we have been unable to confirm the receipt of the faxed information of the increase of Klonopin, I went to [facility] yesterday after work and hand delivered the signed order from Dr Tomi Likens as follows:  Klonopin 0.125 mg disintegrating tablet Take two tablets PO QAM Take two tablets PO QPM #120 6 refills  It was given to [employer], LPN

## 2018-06-18 DIAGNOSIS — R2681 Unsteadiness on feet: Secondary | ICD-10-CM | POA: Diagnosis not present

## 2018-06-18 DIAGNOSIS — Z9181 History of falling: Secondary | ICD-10-CM | POA: Diagnosis not present

## 2018-06-18 DIAGNOSIS — G252 Other specified forms of tremor: Secondary | ICD-10-CM | POA: Diagnosis not present

## 2018-06-18 DIAGNOSIS — R278 Other lack of coordination: Secondary | ICD-10-CM | POA: Diagnosis not present

## 2018-06-19 DIAGNOSIS — G252 Other specified forms of tremor: Secondary | ICD-10-CM | POA: Diagnosis not present

## 2018-06-19 DIAGNOSIS — R2681 Unsteadiness on feet: Secondary | ICD-10-CM | POA: Diagnosis not present

## 2018-06-19 DIAGNOSIS — Z9181 History of falling: Secondary | ICD-10-CM | POA: Diagnosis not present

## 2018-06-19 DIAGNOSIS — R278 Other lack of coordination: Secondary | ICD-10-CM | POA: Diagnosis not present

## 2018-06-22 DIAGNOSIS — G252 Other specified forms of tremor: Secondary | ICD-10-CM | POA: Diagnosis not present

## 2018-06-22 DIAGNOSIS — R2681 Unsteadiness on feet: Secondary | ICD-10-CM | POA: Diagnosis not present

## 2018-06-22 DIAGNOSIS — R278 Other lack of coordination: Secondary | ICD-10-CM | POA: Diagnosis not present

## 2018-06-22 DIAGNOSIS — Z9181 History of falling: Secondary | ICD-10-CM | POA: Diagnosis not present

## 2018-06-23 DIAGNOSIS — R2681 Unsteadiness on feet: Secondary | ICD-10-CM | POA: Diagnosis not present

## 2018-06-23 DIAGNOSIS — G252 Other specified forms of tremor: Secondary | ICD-10-CM | POA: Diagnosis not present

## 2018-06-23 DIAGNOSIS — R278 Other lack of coordination: Secondary | ICD-10-CM | POA: Diagnosis not present

## 2018-06-23 DIAGNOSIS — Z9181 History of falling: Secondary | ICD-10-CM | POA: Diagnosis not present

## 2018-06-24 DIAGNOSIS — R2681 Unsteadiness on feet: Secondary | ICD-10-CM | POA: Diagnosis not present

## 2018-06-24 DIAGNOSIS — G252 Other specified forms of tremor: Secondary | ICD-10-CM | POA: Diagnosis not present

## 2018-06-24 DIAGNOSIS — Z9181 History of falling: Secondary | ICD-10-CM | POA: Diagnosis not present

## 2018-06-24 DIAGNOSIS — R278 Other lack of coordination: Secondary | ICD-10-CM | POA: Diagnosis not present

## 2018-06-25 DIAGNOSIS — Z9181 History of falling: Secondary | ICD-10-CM | POA: Diagnosis not present

## 2018-06-25 DIAGNOSIS — R2681 Unsteadiness on feet: Secondary | ICD-10-CM | POA: Diagnosis not present

## 2018-06-25 DIAGNOSIS — G252 Other specified forms of tremor: Secondary | ICD-10-CM | POA: Diagnosis not present

## 2018-06-25 DIAGNOSIS — R278 Other lack of coordination: Secondary | ICD-10-CM | POA: Diagnosis not present

## 2018-06-26 DIAGNOSIS — R278 Other lack of coordination: Secondary | ICD-10-CM | POA: Diagnosis not present

## 2018-06-26 DIAGNOSIS — R2681 Unsteadiness on feet: Secondary | ICD-10-CM | POA: Diagnosis not present

## 2018-06-26 DIAGNOSIS — G252 Other specified forms of tremor: Secondary | ICD-10-CM | POA: Diagnosis not present

## 2018-06-26 DIAGNOSIS — Z9181 History of falling: Secondary | ICD-10-CM | POA: Diagnosis not present

## 2018-07-03 ENCOUNTER — Encounter: Payer: Self-pay | Admitting: Adult Health

## 2018-07-03 ENCOUNTER — Non-Acute Institutional Stay (SKILLED_NURSING_FACILITY): Payer: Medicare Other | Admitting: Adult Health

## 2018-07-03 DIAGNOSIS — F419 Anxiety disorder, unspecified: Secondary | ICD-10-CM | POA: Diagnosis not present

## 2018-07-03 DIAGNOSIS — N3281 Overactive bladder: Secondary | ICD-10-CM | POA: Diagnosis not present

## 2018-07-03 DIAGNOSIS — K5909 Other constipation: Secondary | ICD-10-CM | POA: Diagnosis not present

## 2018-07-03 DIAGNOSIS — M810 Age-related osteoporosis without current pathological fracture: Secondary | ICD-10-CM

## 2018-07-03 DIAGNOSIS — I340 Nonrheumatic mitral (valve) insufficiency: Secondary | ICD-10-CM

## 2018-07-03 NOTE — Progress Notes (Signed)
Location:  [place] Room Number: 304-B Place of Service:  SNF (31) Provider:  Durenda Age, NP  Patient Care Team: Hendricks Limes, MD as PCP - General (Internal Medicine) Larey Dresser, MD as Referring Physician (Cardiology) Larey Dresser, MD (Cardiology) Medina-Vargas, Senaida Lange, NP as Nurse Practitioner (Internal Medicine)  Extended Emergency Contact Information Primary Emergency Contact: [name], [place] [address] [place] Phone: [phone] Relation: Sister  Code Status:  Full Code  Goals of care: Advanced Directive information Advanced Directives 01/22/2018  Does Patient Have a Medical Advance Directive? No  Type of Advance Directive -  Would patient like information on creating a medical advance directive? No - Patient declined  Pre-existing out of facility DNR order (yellow form or pink MOST form) -     Chief Complaint  Patient presents with  . Medical Management of Chronic Issues    Routine [facility] visit    HPI:  Pt is a 62 y.o. female seen today for medical management of chronic diseases.  She is a long-term care resident of [facility].  She has a PMH of astrocytoma, mitral regurgitation, mitral valve prolapse, OA, and osteoporosis. She was seen waiting in line to the dentist. She was asking about antibiotic prophylaxis since she has mitral valve prolapse. She said that she always take ATB prophylaxis whenever she has dental procedures.   Past Medical History:  Diagnosis Date  . 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 C. difficile colitis 10/9526   complicated by sepsis/ARDS and prolonged hosp  . Depression   . Hyperlipidemia   . Mitral regurgitation 10/2011   severe, s/p min invasive repair and annuloplasty  . Mitral valve prolapse   . Neuromuscular disorder (HCC)    numbness in hands and feet  . Osteoarthritis   .  Osteoporosis    DEXA @ [facility] 11/2012: -3.5, prior tx Reclast, last in 2011, rec change to Prolia  . Overactive bladder   . Pleural effusion, left 11/11/2011  . Vitamin D deficiency    Past Surgical History:  Procedure Laterality Date  . CARDIAC CATHETERIZATION     Jan 2013  . KNEE ARTHROSCOPY Right   . MITRAL VALVE REPAIR  10/15/2011   Procedure: MINIMALLY INVASIVE MITRAL VALVE REPAIR (MVR);  Surgeon: Rexene Alberts, MD;  Location: [place];  Service: Open Heart Surgery;  Laterality: Right;  . TRANSESOPHAGEAL ECHOCARDIOGRAM  7/12  . US ECHOCARDIOGRAPHY  6/12  . VENTRICULOPERITONEAL SHUNT  1971    Allergies  Allergen Reactions  . Compazine Other (See Comments)    "eyes get buggy"    Outpatient Encounter Medications as of 07/03/2018  Medication Sig  . atorvastatin (LIPITOR) 10 MG tablet TAKE ONE TABLET BY MOUTH DAILY  . cholecalciferol (VITAMIN D) 1000 units tablet Take 1,000 Units by mouth daily.  . clonazepam (KLONOPIN) 0.125 MG disintegrating tablet Take 0.125 mg by mouth 2 (two) times daily. Take 2 tablets BID  . denosumab (PROLIA) 60 MG/ML SOLN injection Inject 60 mg into the skin every 6 (six) months. Administer in upper arm, thigh, or abdomen  . escitalopram (LEXAPRO) 10 MG tablet Take 1 tablet (10 mg total) by mouth daily.  Marland Kitchen linaclotide (LINZESS) 145 MCG CAPS capsule TAKE ONE CAPSULE BY MOUTH DAILY BEFORE BREAKFAST  . loratadine (CLARITIN) 10 MG tablet Take 10 mg by mouth daily.  . Menthol, Topical Analgesic, (  BIOFREEZE) 4 % GEL Apply 1 application topically 2 (two) times daily. Apply topically BID to sore areas  . mirabegron ER (MYRBETRIQ) 50 MG TB24 tablet Take 50 mg by mouth daily.  . polyethylene glycol (MIRALAX / GLYCOLAX) packet Take 17 g by mouth daily as needed for mild constipation or moderate constipation.  . polyvinyl alcohol (LIQUIFILM TEARS) 1.4 % ophthalmic solution Place 1 drop into both eyes 4 (four) times daily.   . White Petrolatum-Mineral Oil (LUBRIFRESH P.M. OP)  Apply 1 application to eye at bedtime. Apply to right eye  . [DISCONTINUED] clonazepam (KLONOPIN) 0.125 MG disintegrating tablet Take 1-2 tablets (0.125-0.25 mg total) by mouth See admin instructions. Take 1 tablet to = 0.125 mg QAM, take 2 tablets to = 0.25 mg QPM.  . [DISCONTINUED] NUTRITIONAL SUPPLEMENT LIQD Take 120 mLs by mouth 2 (two) times daily. MedPass    No facility-administered encounter medications on file as of 07/03/2018.     Review of Systems  GENERAL: No change in appetite, no fatigue, no weight changes, no fever, chills or weakness MOUTH and THROAT: Denies oral discomfort, gingival pain or bleeding RESPIRATORY: no cough, SOB, DOE, wheezing, hemoptysis CARDIAC: No chest pain, edema or palpitations GI: No abdominal pain, diarrhea, constipation, heart burn, nausea or vomiting GU: Denies dysuria, frequency, hematuria, incontinence, or discharge PSYCHIATRIC: Denies feelings of depression or anxiety. No report of hallucinations, insomnia, paranoia, or agitation   Immunization History  Administered Date(s) Administered  . Influenza Split 08/11/2012  . Influenza,inj,Quad PF,6+ Mos 05/14/2013, 07/19/2014, 06/29/2015, 07/01/2016  . Influenza-Unspecified 06/03/2011, 06/09/2017  . PPD Test 01/23/2018  . Pneumococcal Polysaccharide-23 11/23/2011  . Tdap 01/23/2018   Pertinent  Health Maintenance Due  Topic Date Due  . PAP SMEAR  12/02/1978  . COLONOSCOPY  07/27/2014  . INFLUENZA VACCINE  04/09/2018  . MAMMOGRAM  04/13/2020   Fall Risk  03/31/2018 01/22/2018 11/24/2017 06/29/2015 11/11/2012  Falls in the past year? Yes Yes Yes Yes No  Number falls in past yr: 2 or more 1 2 or more 2 or more -  Injury with Fall? Yes Yes Yes Yes -  Comment - - - bruising  -  Risk Factor Category  High Fall Risk - High Fall Risk High Fall Risk -  Risk for fall due to : - - - Impaired balance/gait;Impaired mobility;History of fall(s) Impaired balance/gait;Impaired mobility  Follow up - - Falls  evaluation completed - -      Vitals:   07/03/18 0924  BP: (!) 96/56  Pulse: 95  Resp: 19  Temp: 97.7 F (36.5 C)  TempSrc: Oral  Weight: 85 lb (38.6 kg)  Height: 4\' 8"  (1.422 m)   Body mass index is 19.06 kg/m.  Physical Exam  GENERAL APPEARANCE: Well nourished. In no acute distress. Normal body habitus SKIN:  Skin is warm and dry. MOUTH and THROAT: Lips are without lesions. Oral mucosa is moist and without lesions. Tongue is normal in shape, size, and color and without lesions RESPIRATORY: Breathing is even & unlabored, BS CTAB CARDIAC: RRR, no murmur,no extra heart sounds, no edema GI: Abdomen soft, normal BS, no masses, no tenderness EXTREMITIES:  Able to move X 4 extremities NEUROLOGICAL: + tremors. Speech is clear PSYCHIATRIC: Alert and oriented X 3. Affect and behavior are appropriate   Labs reviewed: Recent Labs    12/12/17 01/20/18  NA 141 144  K 4.3 3.9  BUN 18 20  CREATININE 0.5 0.5   Recent Labs    12/12/17  AST 15  ALT 9  ALKPHOS 56   Recent Labs    12/12/17  WBC 6.4  NEUTROABS 4  HGB 12.5  HCT 39  PLT 185   Lab Results  Component Value Date   TSH 1.15 03/02/2015   Lab Results  Component Value Date   HGBA1C 5.5 10/11/2011   Lab Results  Component Value Date   CHOL 150 12/12/2017   HDL 38 12/12/2017   LDLCALC 101 12/12/2017   LDLDIRECT 165.0 05/14/2013   TRIG 54 12/12/2017   CHOLHDL 4 11/06/2015    Assessment/Plan  1. Mitral valve insufficiency, unspecified etiology -  Ordered Cephalexin 500 mg 4 tabs= 2 gm PO X 1 30 minutes prior to dental cleaning   2. Chronic constipation - verbalized having regular bowel movement, continue MiraLAX 17 g daily as needed, and Linzess 145 mcg 1 capsule daily   3. OAB (overactive bladder) -continue Myrbetriq ER 50 mg 1 tab daily   4. Anxiety disorder, unspecified type -mood this is stable, continue clonazepam 0.125 mg 2 tabs = 0.25 mg BID   5. Osteoporosis, unspecified osteoporosis type,  unspecified pathological fracture presence - recently started on Prolia 60 mg/mL subcutaneous q. 6 months and to continue vitamin D3 1000 units 1 tab daily    Family/ staff Communication: Discussed plan of care with resident.  Labs/tests ordered:  None  Goals of care:   Long-term care.   Durenda Age, NP [facility] [phone] (Monday-Friday 8:00 a.m. - 5:00 p.m.) [phone] (after hours)

## 2018-07-07 DIAGNOSIS — Z9181 History of falling: Secondary | ICD-10-CM | POA: Diagnosis not present

## 2018-07-07 DIAGNOSIS — G252 Other specified forms of tremor: Secondary | ICD-10-CM | POA: Diagnosis not present

## 2018-07-07 DIAGNOSIS — R278 Other lack of coordination: Secondary | ICD-10-CM | POA: Diagnosis not present

## 2018-07-07 DIAGNOSIS — R2681 Unsteadiness on feet: Secondary | ICD-10-CM | POA: Diagnosis not present

## 2018-07-09 DIAGNOSIS — G252 Other specified forms of tremor: Secondary | ICD-10-CM | POA: Diagnosis not present

## 2018-07-09 DIAGNOSIS — R2681 Unsteadiness on feet: Secondary | ICD-10-CM | POA: Diagnosis not present

## 2018-07-09 DIAGNOSIS — Z9181 History of falling: Secondary | ICD-10-CM | POA: Diagnosis not present

## 2018-07-09 DIAGNOSIS — R278 Other lack of coordination: Secondary | ICD-10-CM | POA: Diagnosis not present

## 2018-07-10 DIAGNOSIS — Z9181 History of falling: Secondary | ICD-10-CM | POA: Diagnosis not present

## 2018-07-10 DIAGNOSIS — R278 Other lack of coordination: Secondary | ICD-10-CM | POA: Diagnosis not present

## 2018-07-11 DIAGNOSIS — Z9181 History of falling: Secondary | ICD-10-CM | POA: Diagnosis not present

## 2018-07-11 DIAGNOSIS — R278 Other lack of coordination: Secondary | ICD-10-CM | POA: Diagnosis not present

## 2018-07-13 DIAGNOSIS — R278 Other lack of coordination: Secondary | ICD-10-CM | POA: Diagnosis not present

## 2018-07-13 DIAGNOSIS — Z9181 History of falling: Secondary | ICD-10-CM | POA: Diagnosis not present

## 2018-07-14 DIAGNOSIS — Z9181 History of falling: Secondary | ICD-10-CM | POA: Diagnosis not present

## 2018-07-14 DIAGNOSIS — R278 Other lack of coordination: Secondary | ICD-10-CM | POA: Diagnosis not present

## 2018-07-15 DIAGNOSIS — Z9181 History of falling: Secondary | ICD-10-CM | POA: Diagnosis not present

## 2018-07-15 DIAGNOSIS — R278 Other lack of coordination: Secondary | ICD-10-CM | POA: Diagnosis not present

## 2018-07-16 DIAGNOSIS — R278 Other lack of coordination: Secondary | ICD-10-CM | POA: Diagnosis not present

## 2018-07-16 DIAGNOSIS — Z9181 History of falling: Secondary | ICD-10-CM | POA: Diagnosis not present

## 2018-07-18 DIAGNOSIS — R278 Other lack of coordination: Secondary | ICD-10-CM | POA: Diagnosis not present

## 2018-07-18 DIAGNOSIS — Z9181 History of falling: Secondary | ICD-10-CM | POA: Diagnosis not present

## 2018-07-20 DIAGNOSIS — R278 Other lack of coordination: Secondary | ICD-10-CM | POA: Diagnosis not present

## 2018-07-20 DIAGNOSIS — Z9181 History of falling: Secondary | ICD-10-CM | POA: Diagnosis not present

## 2018-07-22 DIAGNOSIS — R278 Other lack of coordination: Secondary | ICD-10-CM | POA: Diagnosis not present

## 2018-07-22 DIAGNOSIS — Z9181 History of falling: Secondary | ICD-10-CM | POA: Diagnosis not present

## 2018-07-25 DIAGNOSIS — R278 Other lack of coordination: Secondary | ICD-10-CM | POA: Diagnosis not present

## 2018-07-25 DIAGNOSIS — Z9181 History of falling: Secondary | ICD-10-CM | POA: Diagnosis not present

## 2018-07-27 ENCOUNTER — Other Ambulatory Visit: Payer: Self-pay

## 2018-07-27 DIAGNOSIS — R278 Other lack of coordination: Secondary | ICD-10-CM | POA: Diagnosis not present

## 2018-07-27 DIAGNOSIS — Z9181 History of falling: Secondary | ICD-10-CM | POA: Diagnosis not present

## 2018-07-27 MED ORDER — CLONAZEPAM 0.125 MG PO TBDP: 0.2500 mg | ORAL_TABLET | Freq: Two times a day (BID) | ORAL | 0 refills | Status: DC

## 2018-07-27 NOTE — Telephone Encounter (Signed)
Hard script written by Durenda Age, NP and given to nurse, Kennyth Lose.

## 2018-07-28 DIAGNOSIS — Z9181 History of falling: Secondary | ICD-10-CM | POA: Diagnosis not present

## 2018-07-28 DIAGNOSIS — R278 Other lack of coordination: Secondary | ICD-10-CM | POA: Diagnosis not present

## 2018-07-29 DIAGNOSIS — Z9181 History of falling: Secondary | ICD-10-CM | POA: Diagnosis not present

## 2018-07-29 DIAGNOSIS — R278 Other lack of coordination: Secondary | ICD-10-CM | POA: Diagnosis not present

## 2018-07-30 DIAGNOSIS — Z9181 History of falling: Secondary | ICD-10-CM | POA: Diagnosis not present

## 2018-07-30 DIAGNOSIS — R278 Other lack of coordination: Secondary | ICD-10-CM | POA: Diagnosis not present

## 2018-07-31 DIAGNOSIS — F329 Major depressive disorder, single episode, unspecified: Secondary | ICD-10-CM | POA: Diagnosis not present

## 2018-07-31 DIAGNOSIS — F419 Anxiety disorder, unspecified: Secondary | ICD-10-CM | POA: Diagnosis not present

## 2018-07-31 DIAGNOSIS — Z9181 History of falling: Secondary | ICD-10-CM | POA: Diagnosis not present

## 2018-07-31 DIAGNOSIS — R278 Other lack of coordination: Secondary | ICD-10-CM | POA: Diagnosis not present

## 2018-08-04 ENCOUNTER — Encounter: Payer: Self-pay | Admitting: Internal Medicine

## 2018-08-04 ENCOUNTER — Non-Acute Institutional Stay (SKILLED_NURSING_FACILITY): Payer: Medicare Other | Admitting: Internal Medicine

## 2018-08-04 DIAGNOSIS — I480 Paroxysmal atrial fibrillation: Secondary | ICD-10-CM | POA: Diagnosis not present

## 2018-08-04 DIAGNOSIS — M810 Age-related osteoporosis without current pathological fracture: Secondary | ICD-10-CM | POA: Diagnosis not present

## 2018-08-04 DIAGNOSIS — G709 Myoneural disorder, unspecified: Secondary | ICD-10-CM

## 2018-08-04 NOTE — Assessment & Plan Note (Signed)
Verify if Dr. Tomi Likens wants to pursue MRI as mentioned in his July 2019 note.

## 2018-08-04 NOTE — Assessment & Plan Note (Addendum)
10/04/2017 clinically rhythm is regular Reassess thyroid function with TSH if not current in [place]

## 2018-08-04 NOTE — Progress Notes (Signed)
NURSING HOME LOCATION:  [facility] ROOM NUMBER:  304-B  CODE STATUS:  Full Code  PCP:  Hendricks Limes, MD  [place] [address]  This is a nursing facility follow up of chronic medical diagnoses.  Interim medical record and care since last [facility] visit was updated with review of diagnostic studies and change in clinical status since last visit were documented.  HPI: She is a permanent resident of the facility with diagnoses of PMH of central nervous system astrocytoma, diastolic congestive heart failure, anxiety disorder, PAF, osteoporosis, and neurogenic bladder. She has had mitral valve repair for the cusp prolapse and ventriculoperitoneal shunting.  Neurology follow-up was last completed 03/31/2018 for the cerebellar tremor.  She did report some hallucinations on higher dose clonazepam at 0.25 mg.  Dr. Tomi Likens explained that cerebellar tremors were extremely difficult to treat and most medications were ineffective.  Clonazepam was to be continued with titration of the dose based on response.  Dose reduction was to be pursued were she to have additional hallucinations. Dr Tomi Likens mentioned pursuing MRI, but there is no record of this having been completed.  Review of systems: She denies any significant symptoms except for the persistent, troublesome tremors. She does validate she has had some blurred vision.  She states she has occasional gasping-like respirations but this is not persistent.  Does have numbness and tingling in her hands.  She also has occasional numbness of the right side of her tongue.  Constitutional: No fever, significant weight change, fatigue  Eyes: No redness, discharge, pain ENT/mouth: No nasal congestion,  purulent discharge, earache, change in hearing, sore throat  Cardiovascular: No chest pain, palpitations, paroxysmal nocturnal dyspnea, claudication, edema  Respiratory: No cough, sputum production,  hemoptysis,significant snoring, apnea   Gastrointestinal: No heartburn, dysphagia, abdominal pain, nausea /vomiting, rectal bleeding, melena, change in bowels Genitourinary: No dysuria, hematuria, pyuria, incontinence, nocturia Musculoskeletal: No joint stiffness, joint swelling, weakness, pain Dermatologic: No rash, pruritus, change in appearance of skin Neurologic: No dizziness, headache, syncope, seizures Psychiatric: No significant anxiety, depression, insomnia, anorexia Endocrine: No change in hair/skin/nails, excessive thirst, excessive hunger, excessive urination  Hematologic/lymphatic: No significant bruising, lymphadenopathy, abnormal bleeding Allergy/immunology: No itchy/watery eyes, significant sneezing, urticaria, angioedema  Physical exam:  Pertinent or positive findings: She is alert and oriented.  There is slight esotropia of the left eye.  There may be minimal anisocoria with the right pupil minimally larger than the left.  She exhibits grossly jerking of her trunk and a bobbing tremor of her head.  She has delay in following instructions such as strength testing to opposition; but she is surprisingly strong in the extremities.  General appearance: Thin but adequately nourished; no acute distress, increased work of breathing is present.   Lymphatic: No lymphadenopathy about the head, neck, axilla. Eyes: No conjunctival inflammation or lid edema is present. There is no scleral icterus. Ears:  External ear exam shows no significant lesions or deformities.   Nose:  External nasal examination shows no deformity or inflammation. Nasal mucosa are pink and moist without lesions, exudates Oral exam:  Lips and gums are healthy appearing. There is no oropharyngeal erythema or exudate. Neck:  No thyromegaly, masses, tenderness noted.    Heart:  Normal rate and regular rhythm. S1 and S2 normal without gallop, murmur, click, rub .  Lungs: Chest clear to auscultation without wheezes, rhonchi,  rales, rubs. Abdomen: Bowel sounds are normal. Abdomen is soft and nontender with no organomegaly, hernias,  masses. GU: Deferred  Extremities:  No cyanosis, clubbing, edema  Neurologic exam : Balance, Rhomberg, finger to nose testing could not be completed due to clinical state Skin: Warm & dry w/o tenting. No significant lesions or rash.  See summary under each active problem in the Problem List with associated updated therapeutic plan

## 2018-08-05 ENCOUNTER — Encounter: Payer: Self-pay | Admitting: Internal Medicine

## 2018-08-05 NOTE — Patient Instructions (Signed)
See assessment and plan under each diagnosis in the problem list and acutely for this visit 

## 2018-08-05 NOTE — Assessment & Plan Note (Signed)
Continue vitamin D & Prolia

## 2018-08-10 ENCOUNTER — Other Ambulatory Visit: Payer: Self-pay

## 2018-08-10 ENCOUNTER — Telehealth: Payer: Self-pay

## 2018-08-10 DIAGNOSIS — G252 Other specified forms of tremor: Secondary | ICD-10-CM

## 2018-08-10 NOTE — Telephone Encounter (Signed)
Called and LMOVM for [name] to advise orders have been placed and [facility] will call to set up MRI

## 2018-08-10 NOTE — Telephone Encounter (Signed)
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  She has had increased tremors and last time I saw her, I wanted to get MRI of brain with and without contrast, which apparently has not been done. Her PCP sent a note to me indicating that it has not yet been done.  Could we get this set up?  THanks

## 2018-08-10 NOTE — Progress Notes (Signed)
Mri

## 2018-08-13 ENCOUNTER — Other Ambulatory Visit: Payer: Self-pay

## 2018-08-13 MED ORDER — CLONAZEPAM 0.125 MG PO TBDP: 0.2500 mg | ORAL_TABLET | Freq: Two times a day (BID) | ORAL | 0 refills | Status: DC

## 2018-08-13 NOTE — Telephone Encounter (Signed)
Hard script written by Durenda Age, NP and given to Kennyth Lose, nurse.

## 2018-08-26 ENCOUNTER — Non-Acute Institutional Stay (SKILLED_NURSING_FACILITY): Payer: Medicare Other | Admitting: Adult Health

## 2018-08-26 ENCOUNTER — Encounter: Payer: Self-pay | Admitting: Adult Health

## 2018-08-26 DIAGNOSIS — F339 Major depressive disorder, recurrent, unspecified: Secondary | ICD-10-CM | POA: Diagnosis not present

## 2018-08-26 DIAGNOSIS — E785 Hyperlipidemia, unspecified: Secondary | ICD-10-CM

## 2018-08-26 DIAGNOSIS — K5909 Other constipation: Secondary | ICD-10-CM | POA: Diagnosis not present

## 2018-08-26 DIAGNOSIS — G709 Myoneural disorder, unspecified: Secondary | ICD-10-CM | POA: Diagnosis not present

## 2018-08-26 DIAGNOSIS — Z7189 Other specified counseling: Secondary | ICD-10-CM | POA: Diagnosis not present

## 2018-08-26 DIAGNOSIS — Z1211 Encounter for screening for malignant neoplasm of colon: Secondary | ICD-10-CM

## 2018-08-26 DIAGNOSIS — F418 Other specified anxiety disorders: Secondary | ICD-10-CM

## 2018-08-26 NOTE — Progress Notes (Signed)
Location:  [place] Room Number: 304-B Place of Service:  SNF (31) Provider:  Durenda Age, NP  Patient Care Team: Hendricks Limes, MD as PCP - General (Internal Medicine) Larey Dresser, MD as Referring Physician (Cardiology) Larey Dresser, MD (Cardiology) Medina-Vargas, Senaida Lange, NP as Nurse Practitioner (Internal Medicine)  Extended Emergency Contact Information Primary Emergency Contact: [name], [place] [address] [place] Phone: [phone] Relation: Sister  Code Status:  Full Code  Goals of care: Advanced Directive information Advanced Directives 01/22/2018  Does Patient Have a Medical Advance Directive? No  Type of Advance Directive -  Would patient like information on creating a medical advance directive? No - Patient declined  Pre-existing out of facility DNR order (yellow form or pink MOST form) -     Chief Complaint  Patient presents with  . Medical Management of Chronic Issues    Routine [facility] visit  . Advanced Directive    Care Plan Meeting    HPI:  Pt is a 62 y.o. female seen today for medical management of chronic diseases, as well as a care plan meeting.  She is a long-term care resident of [facility].  She has a PMH of astrocytoma, mitral regurgitation, mitral valve prolapse, OA, and osteoporosis.    Care plan meeting attended by resident, NP, social worker and MDS coordinator. The resident's sister attended via conference call. She continues to be full code. She is having restorative program for dressing and grooming. Discussed current medications. Care plan meeting lasted for 20 minutes.    Past Medical History:  Diagnosis Date  . 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 C. difficile colitis 11/8180   complicated by sepsis/ARDS and prolonged hosp  . Depression   . Hyperlipidemia   . Mitral regurgitation 10/2011     severe, s/p min invasive repair and annuloplasty  . Mitral valve prolapse   . Neuromuscular disorder (HCC)    numbness in hands and feet  . Osteoarthritis   . Osteoporosis    DEXA @ [facility] 11/2012: -3.5, prior tx Reclast, last in 2011, rec change to Prolia  . Overactive bladder   . Pleural effusion, left 11/11/2011  . Vitamin D deficiency    Past Surgical History:  Procedure Laterality Date  . CARDIAC CATHETERIZATION     Jan 2013  . KNEE ARTHROSCOPY Right   . MITRAL VALVE REPAIR  10/15/2011   Procedure: MINIMALLY INVASIVE MITRAL VALVE REPAIR (MVR);  Surgeon: Rexene Alberts, MD;  Location: [place];  Service: Open Heart Surgery;  Laterality: Right;  . TRANSESOPHAGEAL ECHOCARDIOGRAM  7/12  . US ECHOCARDIOGRAPHY  6/12  . VENTRICULOPERITONEAL SHUNT  1971    Allergies  Allergen Reactions  . Compazine Other (See Comments)    "eyes get buggy"    Outpatient Encounter Medications as of 08/26/2018  Medication Sig  . amoxicillin (AMOXIL) 500 MG capsule Take 2,000 mg by mouth once. Take prior to dental procedure  . atorvastatin (LIPITOR) 10 MG tablet TAKE ONE TABLET BY MOUTH DAILY  . cholecalciferol (VITAMIN D) 1000 units tablet Take 1,000 Units by mouth daily.  . clonazePAM (KLONOPIN) 0.25 MG disintegrating tablet Take 0.25 mg by mouth 2 (two) times daily.  Marland Kitchen denosumab (PROLIA) 60 MG/ML SOLN injection Inject 60 mg into the skin every 6 (six) months. Administer in upper arm, thigh, or abdomen  . escitalopram (LEXAPRO)  10 MG tablet Take 1 tablet (10 mg total) by mouth daily.  Marland Kitchen linaclotide (LINZESS) 145 MCG CAPS capsule TAKE ONE CAPSULE BY MOUTH DAILY BEFORE BREAKFAST  . loratadine (CLARITIN) 10 MG tablet Take 10 mg by mouth daily.  . Menthol, Topical Analgesic, (BIOFREEZE) 4 % GEL Apply 1 application topically 2 (two) times daily. Apply topically BID to sore areas  . mirabegron ER (MYRBETRIQ) 50 MG TB24 tablet Take 50 mg by mouth daily.  . polyethylene glycol (MIRALAX / GLYCOLAX) packet Take 17  g by mouth daily as needed for mild constipation or moderate constipation.  . polyvinyl alcohol (LIQUIFILM TEARS) 1.4 % ophthalmic solution Place 1 drop into both eyes 4 (four) times daily.   . psyllium (METAMUCIL) 58.6 % packet Take 1 packet by mouth every Monday, Wednesday, and Friday.  . White Petrolatum-Mineral Oil (LUBRIFRESH P.M. OP) Apply 1 application to eye at bedtime. Apply to right eye  . [DISCONTINUED] clonazepam (KLONOPIN) 0.125 MG disintegrating tablet Take 2 tablets (0.25 mg total) by mouth 2 (two) times daily. Take 2 tablets BID   No facility-administered encounter medications on file as of 08/26/2018.     Review of Systems  GENERAL: No change in appetite, no fatigue, no weight changes, no fever, chills or weakness MOUTH and THROAT: Denies oral discomfort, gingival pain  RESPIRATORY: no cough, SOB, DOE, wheezing, hemoptysis CARDIAC: No chest pain, edema or palpitations GI: No abdominal pain, diarrhea, constipation, heart burn, nausea or vomiting GU: Denies dysuria, frequency, hematuria, incontinence, or discharge PSYCHIATRIC: Denies feelings of depression or anxiety. No report of hallucinations, insomnia, paranoia, or agitation   Immunization History  Administered Date(s) Administered  . Influenza Split 08/11/2012  . Influenza,inj,Quad PF,6+ Mos 05/14/2013, 07/19/2014, 06/29/2015, 07/01/2016  . Influenza-Unspecified 06/09/2017, 07/07/2018  . PPD Test 01/23/2018  . Pneumococcal Polysaccharide-23 11/23/2011  . Tdap 01/23/2018   Pertinent  Health Maintenance Due  Topic Date Due  . PAP SMEAR-Modifier  12/02/1978  . COLONOSCOPY  07/27/2014  . MAMMOGRAM  04/13/2020  . INFLUENZA VACCINE  Completed   Fall Risk  03/31/2018 01/22/2018 11/24/2017 06/29/2015 11/11/2012  Falls in the past year? Yes Yes Yes Yes No  Number falls in past yr: 2 or more 1 2 or more 2 or more -  Injury with Fall? Yes Yes Yes Yes -  Comment - - - bruising  -  Risk Factor Category  High Fall Risk -  High Fall Risk High Fall Risk -  Risk for fall due to : - - - Impaired balance/gait;Impaired mobility;History of fall(s) Impaired balance/gait;Impaired mobility  Follow up - - Falls evaluation completed - -      Vitals:   08/26/18 1028  BP: (!) 101/59  Pulse: 92  Resp: 18  Temp: (!) 97.1 F (36.2 C)  TempSrc: Oral  Weight: 87 lb 9.6 oz (39.7 kg)  Height: 4\' 8"  (1.422 m)   Body mass index is 19.64 kg/m.  Physical Exam  GENERAL APPEARANCE:  In no acute distress. SKIN:  Skin is warm and dry.  MOUTH and THROAT: Lips are without lesions. Oral mucosa is moist and without lesions.  RESPIRATORY: Breathing is even & unlabored, BS CTAB CARDIAC: RRR, no murmur,no extra heart sounds, no edema GI: Abdomen soft, normal BS, no masses, no tenderness NEUROLOGICAL: There is no tremor. Speech is clear. Alert and oriented X 3. PSYCHIATRIC:  Affect and behavior are appropriate   Labs reviewed: Recent Labs    12/12/17 01/20/18  NA 141 144  K 4.3 3.9  BUN 18 20  CREATININE 0.5 0.5   Recent Labs    12/12/17  AST 15  ALT 9  ALKPHOS 56   Recent Labs    12/12/17  WBC 6.4  NEUTROABS 4  HGB 12.5  HCT 39  PLT 185   Lab Results  Component Value Date   TSH 1.15 03/02/2015   Lab Results  Component Value Date   HGBA1C 5.5 10/11/2011   Lab Results  Component Value Date   CHOL 150 12/12/2017   HDL 38 12/12/2017   LDLCALC 101 12/12/2017   LDLDIRECT 165.0 05/14/2013   TRIG 54 12/12/2017   CHOLHDL 4 11/06/2015     Assessment/Plan  1. Chronic constipation -continue Linzess 145 mcg 1 capsule daily   2. Neuromuscular disorder [facility])  -sister verbalized that resident is for brain MRI tomorrow at [facility] imaging, follows up with neurology   3. Hyperlipidemia, unspecified hyperlipidemia type -   4. Other specified anxiety disorders -mood is  stable, continue clonazepam 0.25 mg 1 tab twice a day   5. Colon cancer screening -stool occult blood X 3   6. Depression,  recurrent -continue escitalopram 10 mg 1 tab daily   7. Advanced care planning/counseling -  She remains to be full code, discussed current medications, verbalized no concerns     Family/ staff Communication: Discussed plan of care with IDT.  Labs/tests ordered:  None  Goals of care:   Long-term care.   Durenda Age, NP [facility] and [facility] [phone] (Monday-Friday 8:00 a.m. - 5:00 p.m.) [phone] (after hours)

## 2018-08-27 ENCOUNTER — Ambulatory Visit
Admission: RE | Admit: 2018-08-27 | Discharge: 2018-08-27 | Disposition: A | Discharge status: Home / Self Care | Payer: Medicare Other | Source: Ambulatory Visit | Attending: Neurology | Admitting: Neurology

## 2018-08-27 DIAGNOSIS — R251 Tremor, unspecified: Secondary | ICD-10-CM | POA: Diagnosis not present

## 2018-08-27 DIAGNOSIS — G252 Other specified forms of tremor: Secondary | ICD-10-CM

## 2018-08-27 MED ORDER — GADOBENATE DIMEGLUMINE 529 MG/ML IV SOLN
7.0000 mL | Freq: Once | INTRAVENOUS | Status: AC | PRN
  Administered 2018-08-27: 7 mL via INTRAVENOUS

## 2018-08-28 ENCOUNTER — Telehealth: Payer: Self-pay

## 2018-08-28 DIAGNOSIS — Z85841 Personal history of malignant neoplasm of brain: Secondary | ICD-10-CM | POA: Diagnosis not present

## 2018-08-28 DIAGNOSIS — M6281 Muscle weakness (generalized): Secondary | ICD-10-CM | POA: Diagnosis not present

## 2018-08-28 NOTE — Telephone Encounter (Signed)
-----   Message from Pieter Partridge, DO sent at 08/28/2018  6:46 AM EST ----- MRI seems largely unchanged compared to prior imaging going back to 2003.

## 2018-08-28 NOTE — Telephone Encounter (Signed)
Called and LMOVM for sister [name] advising of MRI results and to call with any questions

## 2018-09-01 DIAGNOSIS — R41841 Cognitive communication deficit: Secondary | ICD-10-CM | POA: Diagnosis not present

## 2018-09-01 DIAGNOSIS — R2681 Unsteadiness on feet: Secondary | ICD-10-CM | POA: Diagnosis not present

## 2018-09-03 ENCOUNTER — Encounter: Payer: Self-pay | Admitting: Internal Medicine

## 2018-09-03 DIAGNOSIS — R29818 Other symptoms and signs involving the nervous system: Secondary | ICD-10-CM | POA: Insufficient documentation

## 2018-09-03 DIAGNOSIS — R4189 Other symptoms and signs involving cognitive functions and awareness: Secondary | ICD-10-CM

## 2018-09-04 DIAGNOSIS — R41841 Cognitive communication deficit: Secondary | ICD-10-CM | POA: Diagnosis not present

## 2018-09-04 DIAGNOSIS — R2681 Unsteadiness on feet: Secondary | ICD-10-CM | POA: Diagnosis not present

## 2018-09-07 DIAGNOSIS — R2681 Unsteadiness on feet: Secondary | ICD-10-CM | POA: Diagnosis not present

## 2018-09-07 DIAGNOSIS — R41841 Cognitive communication deficit: Secondary | ICD-10-CM | POA: Diagnosis not present

## 2018-09-08 ENCOUNTER — Other Ambulatory Visit: Payer: Self-pay

## 2018-09-08 DIAGNOSIS — R2681 Unsteadiness on feet: Secondary | ICD-10-CM | POA: Diagnosis not present

## 2018-09-08 DIAGNOSIS — R41841 Cognitive communication deficit: Secondary | ICD-10-CM | POA: Diagnosis not present

## 2018-09-08 LAB — FECAL OCCULT BLOOD, GUAIAC: Fecal Occult Blood: POSITIVE

## 2018-09-08 MED ORDER — CLONAZEPAM 0.25 MG PO TBDP: 0.2500 mg | ORAL_TABLET | Freq: Two times a day (BID) | ORAL | 0 refills | Status: DC

## 2018-09-08 NOTE — Telephone Encounter (Signed)
Hard script written by Durenda Age, NP and given to Kennyth Lose, nurse.

## 2018-09-09 DIAGNOSIS — R41841 Cognitive communication deficit: Secondary | ICD-10-CM | POA: Diagnosis not present

## 2018-09-09 DIAGNOSIS — R2681 Unsteadiness on feet: Secondary | ICD-10-CM | POA: Diagnosis not present

## 2018-09-10 DIAGNOSIS — R41841 Cognitive communication deficit: Secondary | ICD-10-CM | POA: Diagnosis not present

## 2018-09-10 DIAGNOSIS — R2681 Unsteadiness on feet: Secondary | ICD-10-CM | POA: Diagnosis not present

## 2018-09-11 DIAGNOSIS — R2681 Unsteadiness on feet: Secondary | ICD-10-CM | POA: Diagnosis not present

## 2018-09-11 DIAGNOSIS — D649 Anemia, unspecified: Secondary | ICD-10-CM | POA: Diagnosis not present

## 2018-09-11 DIAGNOSIS — R41841 Cognitive communication deficit: Secondary | ICD-10-CM | POA: Diagnosis not present

## 2018-09-11 LAB — CBC AND DIFFERENTIAL
HCT: 35 — AB (ref 36–46)
Hemoglobin: 11.6 — AB (ref 12.0–16.0)
Neutrophils Absolute: 3
Platelets: 170 (ref 150–399)
WBC: 5.4

## 2018-09-13 DIAGNOSIS — R2681 Unsteadiness on feet: Secondary | ICD-10-CM | POA: Diagnosis not present

## 2018-09-13 DIAGNOSIS — R41841 Cognitive communication deficit: Secondary | ICD-10-CM | POA: Diagnosis not present

## 2018-09-15 DIAGNOSIS — R2681 Unsteadiness on feet: Secondary | ICD-10-CM | POA: Diagnosis not present

## 2018-09-15 DIAGNOSIS — R41841 Cognitive communication deficit: Secondary | ICD-10-CM | POA: Diagnosis not present

## 2018-09-22 ENCOUNTER — Other Ambulatory Visit: Payer: Self-pay | Admitting: Adult Health

## 2018-09-22 MED ORDER — CLONAZEPAM 0.25 MG PO TBDP: 0.2500 mg | ORAL_TABLET | Freq: Two times a day (BID) | ORAL | 0 refills | Status: DC

## 2018-09-22 NOTE — Telephone Encounter (Signed)
Pended medication and forwarded to Durenda Age, NP for approval.  To be sent to [facility].

## 2018-09-22 NOTE — Telephone Encounter (Signed)
Refill request pended and sent to Durenda Age, NP to approve and send to [facility].

## 2018-09-23 DIAGNOSIS — F329 Major depressive disorder, single episode, unspecified: Secondary | ICD-10-CM | POA: Diagnosis not present

## 2018-09-23 DIAGNOSIS — F419 Anxiety disorder, unspecified: Secondary | ICD-10-CM | POA: Diagnosis not present

## 2018-09-29 ENCOUNTER — Encounter: Payer: Self-pay | Admitting: Adult Health

## 2018-09-29 ENCOUNTER — Non-Acute Institutional Stay (SKILLED_NURSING_FACILITY): Payer: Medicare Other | Admitting: Adult Health

## 2018-09-29 DIAGNOSIS — E7849 Other hyperlipidemia: Secondary | ICD-10-CM

## 2018-09-29 DIAGNOSIS — K5909 Other constipation: Secondary | ICD-10-CM | POA: Diagnosis not present

## 2018-09-29 DIAGNOSIS — F339 Major depressive disorder, recurrent, unspecified: Secondary | ICD-10-CM | POA: Diagnosis not present

## 2018-09-29 DIAGNOSIS — F418 Other specified anxiety disorders: Secondary | ICD-10-CM | POA: Diagnosis not present

## 2018-09-29 DIAGNOSIS — N3281 Overactive bladder: Secondary | ICD-10-CM | POA: Diagnosis not present

## 2018-09-29 DIAGNOSIS — M818 Other osteoporosis without current pathological fracture: Secondary | ICD-10-CM

## 2018-09-29 NOTE — Progress Notes (Signed)
Location:  [place] Room Number: 304-B Place of Service:  SNF (31) Provider:  Durenda Age, NP  Patient Care Team: Hendricks Limes, MD as PCP - General (Internal Medicine) Larey Dresser, MD as Referring Physician (Cardiology) Larey Dresser, MD (Cardiology) Medina-Vargas, Senaida Lange, NP as Nurse Practitioner (Internal Medicine)  Extended Emergency Contact Information Primary Emergency Contact: [name], [place] [address] [place] Phone: [phone] Relation: Sister  Code Status:  Full Code  Goals of care: Advanced Directive information Advanced Directives 01/22/2018  Does Patient Have a Medical Advance Directive? No  Type of Advance Directive -  Would patient like information on creating a medical advance directive? No - Patient declined  Pre-existing out of facility DNR order (yellow form or pink MOST form) -     Chief Complaint  Patient presents with  . Medical Management of Chronic Issues    Routine [facility] visit    HPI:  [name] seen today for medical management of chronic diseases.  She is a long-term care resident of [facility].  She has a PMH of astrocytoma, mitral regurgitation, mitral valve prolapse, OA, and osteoporosis. Sister has now agreed for Michaela [name] to have colonoscopy. She was hesitant to have colonoscopy at first because she thought that Michaela [name] cannot drink the prep solution day before colonoscopy. Colonoscopy is being scheduled by scheduler. She verbalized being constipated.       Past Medical History:  Diagnosis Date  . 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 C. difficile colitis 05/3234   complicated by sepsis/ARDS and prolonged hosp  . Depression   . Hyperlipidemia   . Mitral regurgitation 10/2011   severe, s/p min invasive repair and annuloplasty  . Mitral valve prolapse   . Neuromuscular  disorder (HCC)    numbness in hands and feet  . Osteoarthritis   . Osteoporosis    DEXA @ [facility] 11/2012: -3.5, prior tx Reclast, last in 2011, rec change to Prolia  . Overactive bladder   . Pleural effusion, left 11/11/2011  . Vitamin D deficiency    Past Surgical History:  Procedure Laterality Date  . CARDIAC CATHETERIZATION     Jan 2013  . KNEE ARTHROSCOPY Right   . MITRAL VALVE REPAIR  10/15/2011   Procedure: MINIMALLY INVASIVE MITRAL VALVE REPAIR (MVR);  Surgeon: Rexene Alberts, MD;  Location: [place];  Service: Open Heart Surgery;  Laterality: Right;  . TRANSESOPHAGEAL ECHOCARDIOGRAM  7/12  . US ECHOCARDIOGRAPHY  6/12  . VENTRICULOPERITONEAL SHUNT  1971    Allergies  Allergen Reactions  . Compazine Other (See Comments)    "eyes get buggy"    Outpatient Encounter Medications as of 09/29/2018  Medication Sig  . atorvastatin (LIPITOR) 10 MG tablet TAKE ONE TABLET BY MOUTH DAILY  . cholecalciferol (VITAMIN D) 1000 units tablet Take 1,000 Units by mouth daily.  . clonazePAM (KLONOPIN) 0.25 MG disintegrating tablet Take 1 tablet (0.25 mg total) by mouth 2 (two) times daily.  Marland Kitchen denosumab (PROLIA) 60 MG/ML SOLN injection Inject 60 mg into the skin every 6 (six) months. Administer in upper arm, thigh, or abdomen  . escitalopram (LEXAPRO) 10 MG tablet Take 1 tablet (10 mg total) by mouth daily.  Marland Kitchen linaclotide (LINZESS) 145 MCG CAPS capsule TAKE ONE CAPSULE BY MOUTH DAILY BEFORE BREAKFAST  . loratadine (CLARITIN) 10 MG tablet  Take 10 mg by mouth daily.  . Menthol, Topical Analgesic, (BIOFREEZE) 4 % GEL Apply 1 application topically 2 (two) times daily. Apply topically BID to sore areas  . mirabegron ER (MYRBETRIQ) 50 MG TB24 tablet Take 50 mg by mouth daily.  . polyethylene glycol (MIRALAX / GLYCOLAX) packet Take 17 g by mouth daily as needed for mild constipation or moderate constipation.  . polyvinyl alcohol (LIQUIFILM TEARS) 1.4 % ophthalmic solution Place 1 drop into both eyes 4 (four)  times daily.   . psyllium (METAMUCIL) 58.6 % packet Take 1 packet by mouth every Monday, Wednesday, and Friday.  . White Petrolatum-Mineral Oil (LUBRIFRESH P.M. OP) Apply 1 application to eye at bedtime. Apply to right eye   No facility-administered encounter medications on file as of 09/29/2018.     Review of Systems  GENERAL: No change in appetite, no fatigue, no weight changes, no fever, chills or weakness MOUTH and THROAT: Denies oral discomfort, gingival pain or bleeding, pain from teeth or hoarseness   RESPIRATORY: no cough, SOB, DOE, wheezing, hemoptysis CARDIAC: No chest pain, edema or palpitations GI: No abdominal pain, diarrhea, constipation, heart burn, nausea or vomiting GU: Denies dysuria, frequency, hematuria, incontinence, or discharge NEUROLOGICAL: Denies dizziness, syncope, numbness, or headache PSYCHIATRIC: Denies feelings of depression or anxiety. No report of hallucinations, insomnia, paranoia, or agitation    Immunization History  Administered Date(s) Administered  . Influenza Split 08/11/2012  . Influenza,inj,Quad PF,6+ Mos 05/14/2013, 07/19/2014, 06/29/2015, 07/01/2016  . Influenza-Unspecified 06/09/2017, 07/07/2018  . PPD Test 01/23/2018  . Pneumococcal Polysaccharide-23 11/23/2011  . Tdap 01/23/2018   Pertinent  Health Maintenance Due  Topic Date Due  . PAP SMEAR-Modifier  12/02/1978  . COLONOSCOPY  07/27/2014  . MAMMOGRAM  04/13/2020  . INFLUENZA VACCINE  Completed   Fall Risk  03/31/2018 01/22/2018 11/24/2017 06/29/2015 11/11/2012  Falls in the past year? Yes Yes Yes Yes No  Number falls in past yr: 2 or more 1 2 or more 2 or more -  Injury with Fall? Yes Yes Yes Yes -  Comment - - - bruising  -  Risk Factor Category  High Fall Risk - High Fall Risk High Fall Risk -  Risk for fall due to : - - - Impaired balance/gait;Impaired mobility;History of fall(s) Impaired balance/gait;Impaired mobility  Follow up - - Falls evaluation completed - -    Vitals:     09/29/18 0814  BP: (!) 94/58  Pulse: 88  Resp: 18  Temp: 97.8 F (36.6 C)  TempSrc: Oral  Weight: 86 lb (39 kg)  Height: 4\' 11"  (1.499 m)   Body mass index is 17.37 kg/m.  Physical Exam  GENERAL APPEARANCE:   In no acute distress.  SKIN:  Skin is warm and dry.  MOUTH and THROAT: Lips are without lesions. Oral mucosa is moist and without lesions.  RESPIRATORY: Breathing is even & unlabored, BS CTAB CARDIAC: RRR, no murmur,no extra heart sounds, no edema GI: Abdomen soft, normal BS, no masses, no tenderness EXTREMITIES:  Able to move X 4 extremities NEUROLOGICAL: +tremor. Speech is clear. Alert and oriented X 3. PSYCHIATRIC:  Affect and behavior are appropriate   Labs reviewed: Recent Labs    12/12/17 01/20/18  NA 141 144  K 4.3 3.9  BUN 18 20  CREATININE 0.5 0.5   Recent Labs    12/12/17  AST 15  ALT 9  ALKPHOS 56   Recent Labs    12/12/17  WBC 6.4  NEUTROABS 4  HGB  12.5  HCT 39  PLT 185   Lab Results  Component Value Date   TSH 1.15 03/02/2015   Lab Results  Component Value Date   HGBA1C 5.5 10/11/2011   Lab Results  Component Value Date   CHOL 150 12/12/2017   HDL 38 12/12/2017   LDLCALC 101 12/12/2017   LDLDIRECT 165.0 05/14/2013   TRIG 54 12/12/2017   CHOLHDL 4 11/06/2015    Assessment/Plan  1. Other osteoporosis without current pathological fracture - will continue Prolia Q 6 months  2. Other hyperlipidemia -continue Atorvastatin 10 mg 1 tab Q HS  3. Chronic constipation -  Will start Senna-S 8.6-50 mg 2 tabs Q HS, continue Metamucil 1 packet daily  4. Depression, recurrent ([place]) - she denies being depressed, continue Escitalopram 10 mg 1 tab daily  5. Other anxiety disorder  - mood is stable, continue Clonazepam 0.25 mg 1 tab BID    6.  Overactive bladder - continue Myrbetriq ER 50 mg 1 tab daily    Family/ staff Communication: Discussed plan of care with resident.  Labs/tests ordered:  None  Goals of care:    Long-term care.   Durenda Age, NP [facility] and [facility] [phone] (Monday-Friday 8:00 a.m. - 5:00 p.m.) [phone] (after hours)

## 2018-10-02 ENCOUNTER — Telehealth: Payer: Self-pay | Admitting: Internal Medicine

## 2018-10-02 NOTE — Telephone Encounter (Signed)
If her sister is having such issues it may be best to forego the procedure.  She would be at high risk for procedural complications given her issues.  All preps involve similar volumes of liquids.

## 2018-10-02 NOTE — Telephone Encounter (Signed)
Pt was supposed to complete cologuard test and did not. Do you want her scheduled for an OV? Please advise.

## 2018-10-02 NOTE — Telephone Encounter (Signed)
Hemoccult positive stool the appropriate work-up would be colonoscopy.  She has significant neurologic deficits and this would need to be done at the hospital.  She would need to come in with her sister who cares for her regarding pre-visit.  Please contact her sister and see what they wish to do.  Thanks

## 2018-10-02 NOTE — Telephone Encounter (Signed)
Pt's last colon in 2012.  Pt has positive hemoccult 12.31.20 in Epic.  I did not see a recall or any letters sent out.  Could you please check when pt is due for colonoscopy?

## 2018-10-02 NOTE — Telephone Encounter (Signed)
Spoke with pts sister and she states they know she needs to have a colonoscopy and want to get it done, she is just concerned that her sister will not be able to do the prep. She is now in a facility and she is in a wheelchair. Reports she is down to 83 pounds and she cannot drink the volume of fluid required for the prep. She states she only drinks several tablespoons of fluid to take her medications. Pts sister wants to know if there is a different option for her for the colon prep.

## 2018-10-05 ENCOUNTER — Other Ambulatory Visit: Payer: Self-pay | Admitting: Adult Health

## 2018-10-05 MED ORDER — CLONAZEPAM 0.25 MG PO TBDP: 0.2500 mg | ORAL_TABLET | Freq: Two times a day (BID) | ORAL | 0 refills | Status: DC

## 2018-10-05 NOTE — Telephone Encounter (Signed)
Spoke with pts sister and she is aware. Would like to bring her sister in to see Dr. Henrene Pastor and discuss things and decide based on that visit. Pt scheduled to see Dr. Henrene Pastor 10/27/18 @2 :15pm. She is aware of appt.

## 2018-10-05 NOTE — Telephone Encounter (Signed)
Refill request pended and forwarded to Durenda Age, NP to approve.  Request sent to [facility].

## 2018-10-12 NOTE — Progress Notes (Signed)
NEUROLOGY FOLLOW UP OFFICE NOTE  LAEKYN [name] [id number]  HISTORY OF PRESENT ILLNESS: Michaela [name] is a 63 year old woman with depression, anxiety, neurogenic bladder and history of cerebral astrocytoma who follows up for tremor and ataxia.  She is accompanied by her sister who supplements history.  UPDATE:  Since last visit, clonazepam has been increased.  Current medication for tremor:  Clonazepam 0.25mg  in AM and 0.25mg  at bedtime.  Tremors are stable, slightly improved.  She notes feeling tired.  MRI of the brain with and without contrast from 08/27/2018 was personally reviewed and demonstrated suboccipital craniectomy with surgical encephalomalacia, gliosis, and chronic blood products related to surgery and previous XRT, severe atrophy of the cerebellum and brainstem, and balloon fourth ventricle but not significantly increased compared to MRI from 2018 and other imaging going back to 2003.  No hydrocephalus following VP shunt decompression of lateral and third ventricles.  HISTORY: She was diagnosed with a cerebellar astrocytoma as a child and underwent radiation and shunt placement at age 33.  She has residual spasticity, cerebellar dysfunction and neurogenic bladder.  She is nonambulatory at baseline.  However, she is able to transition herself from the wheelchair to the bathroom.  In 2013, she underwent mitral valve repair.  Since then, she reports that her shaking has gotten worse.  The shaking is episodic and usually occurs in the morning when she gets up.  Otherwise, she is unaware of any triggers.  Nothing seems to calm it, it just resolves spontaneously.  It caused her anxiety and she was started on Lexapro, which has helped the anxiety but not the shaking.  When it occurs getting out of bed, she has had falls.  Otherwise, she reports no new symptoms.  PAST MEDICAL HISTORY: Past Medical History:  Diagnosis Date  . Astrocytoma brain tumor ([place]) 1971   astrocytoma  treated with radiation  . C. difficile colitis 03/1244   complicated by sepsis/ARDS and prolonged hosp  . Depression   . Hyperlipidemia   . Mitral regurgitation 10/2011   severe, s/p min invasive repair and annuloplasty  . Mitral valve prolapse   . Neuromuscular disorder (HCC)    numbness in hands and feet  . Osteoarthritis   . Osteoporosis    DEXA @ [facility] 11/2012: -3.5, prior tx Reclast, last in 2011, rec change to Prolia  . Overactive bladder   . Pleural effusion, left 11/11/2011  . Vitamin D deficiency     MEDICATIONS: Current Outpatient Medications on File Prior to Visit  Medication Sig Dispense Refill  . atorvastatin (LIPITOR) 10 MG tablet TAKE ONE TABLET BY MOUTH DAILY 90 tablet 1  . cholecalciferol (VITAMIN D) 1000 units tablet Take 1,000 Units by mouth daily.    . clonazePAM (KLONOPIN) 0.25 MG disintegrating tablet Take 1 tablet (0.25 mg total) by mouth 2 (two) times daily. 28 tablet 0  . denosumab (PROLIA) 60 MG/ML SOLN injection Inject 60 mg into the skin every 6 (six) months. Administer in upper arm, thigh, or abdomen 1 Syringe 0  . escitalopram (LEXAPRO) 10 MG tablet Take 1 tablet (10 mg total) by mouth daily. 90 tablet 1  . linaclotide (LINZESS) 145 MCG CAPS capsule TAKE ONE CAPSULE BY MOUTH DAILY BEFORE BREAKFAST 90 capsule 2  . loratadine (CLARITIN) 10 MG tablet Take 10 mg by mouth daily.    . Menthol, Topical Analgesic, (BIOFREEZE) 4 % GEL Apply 1 application topically 2 (two) times daily. Apply topically BID to sore areas    . mirabegron ER (  MYRBETRIQ) 50 MG TB24 tablet Take 50 mg by mouth daily.    . polyethylene glycol (MIRALAX / GLYCOLAX) packet Take 17 g by mouth daily as needed for mild constipation or moderate constipation.    . polyvinyl alcohol (LIQUIFILM TEARS) 1.4 % ophthalmic solution Place 1 drop into both eyes 4 (four) times daily.     . psyllium (METAMUCIL) 58.6 % packet Take 1 packet by mouth every Monday, Wednesday, and Friday.    . White Petrolatum-Mineral  Oil (LUBRIFRESH P.M. OP) Apply 1 application to eye at bedtime. Apply to right eye     No current facility-administered medications on file prior to visit.     ALLERGIES: Allergies  Allergen Reactions  . Compazine Other (See Comments)    "eyes get buggy"    FAMILY HISTORY: Family History  Problem Relation Age of Onset  . Coronary artery disease Father 48       died MI  . Hyperlipidemia Mother   . Heart attack Maternal Grandfather    SOCIAL HISTORY: Social History   Socioeconomic History  . Marital status: Single    Spouse name: Not on file  . Number of children: 0  . Years of education: Not on file  . Highest education level: Not on file  Occupational History  . Occupation: disabled  Social Needs  . Financial resource strain: Not hard at all  . Food insecurity:    Worry: Never true    Inability: Never true  . Transportation needs:    Medical: No    Non-medical: No  Tobacco Use  . Smoking status: Former Smoker    Last attempt to quit: 09/09/1990    Years since quitting: 28.1  . Smokeless tobacco: Never Used  Substance and Sexual Activity  . Alcohol use: Not Currently    Alcohol/week: 0.0 standard drinks    Comment: rarely  . Drug use: No  . Sexual activity: Not on file  Lifestyle  . Physical activity:    Days per week: 0 days    Minutes per session: 0 min  . Stress: Only a little  Relationships  . Social connections:    Talks on phone: Three times a week    Gets together: Three times a week    Attends religious service: Never    Active member of club or organization: Not on file    Attends meetings of clubs or organizations: Never    Relationship status: Never married  . Intimate partner violence:    Fear of current or ex partner: No    Emotionally abused: No    Physically abused: No    Forced sexual activity: No  Other Topics Concern  . Not on file  Social History Narrative   [facility] in SNF.      Patient is  right-handed. She is a permanent resident of [facility].      REVIEW OF SYSTEMS: Constitutional: No fevers, chills, or sweats, no generalized fatigue, change in appetite Eyes: No visual changes, double vision, eye pain Ear, nose and throat: No hearing loss, ear pain, nasal congestion, sore throat Cardiovascular: No chest pain, palpitations Respiratory:  No shortness of breath at rest or with exertion, wheezes GastrointestinaI: No nausea, vomiting, diarrhea, abdominal pain, fecal incontinence Genitourinary:  No dysuria, urinary retention or frequency Musculoskeletal:  No neck pain, back pain Integumentary: No rash, pruritus, skin lesions Neurological: as above Psychiatric: depression Endocrine: No palpitations, fatigue, diaphoresis, mood swings, change in appetite,  change in weight, increased thirst Hematologic/Lymphatic:  No purpura, petechiae. Allergic/Immunologic: no itchy/runny eyes, nasal congestion, recent allergic reactions, rashes  PHYSICAL EXAM: Blood pressure (!) 88/56, pulse 90, height 4\' 8"  (1.422 m), weight 85 lb (38.6 kg), SpO2 97 %. General: No acute distress.  Patient appears well-groomed.   Head:  Normocephalic/atraumatic Eyes:  Fundi examined but not visualized Neck: supple, no paraspinal tenderness, full range of motion Heart:  Regular rate and rhythm Lungs:  Clear to auscultation bilaterally Back: No paraspinal tenderness Neurological Exam: Mental status: Alert and oriented to person, place, and time; recent and remote history intact, fund of knowledge intact; attention and concentration intact; speech fluent and not dysarthric, language intact.  Cranial nerves: Left eye adducted on primary gaze, unable to abduct left eye, saccadic eye movements with tracking nystagmus.  Otherwise, CN II-XII intact.  Bulk and tone: Decreased bulk, increased tone, no fasciculations.  Motor: 5/5 throughout.  Sensation: Light touch sensation intact.  Deep tendon  reflexes: 2+ throughout.  Finger-to-nose testing: Dysmetria bilaterally.  Gait: Nonambulatory.  IMPRESSION: Ataxia and cerebellar tremor, chronic and secondary to a prior cerebellar astrocytoma and treatment.  Tolerating clonazepam at current dose and tremor is manageable.  PLAN: 1.  Clonazepam 0.25mg  in morning and 0.25mg  at bedtime 2.  Follow up in 6 months  18 minutes spent face to face with patient, over 50% spent discussing management.  Metta Clines, DO  CC: Hendricks Limes, MD

## 2018-10-13 ENCOUNTER — Encounter: Payer: Self-pay | Admitting: Neurology

## 2018-10-13 ENCOUNTER — Ambulatory Visit (INDEPENDENT_AMBULATORY_CARE_PROVIDER_SITE_OTHER): Payer: Medicare Other | Admitting: Neurology

## 2018-10-13 VITALS — BP 88/56 | HR 90 | Ht <= 58 in | Wt 85.0 lb

## 2018-10-13 DIAGNOSIS — R270 Ataxia, unspecified: Secondary | ICD-10-CM

## 2018-10-13 DIAGNOSIS — G252 Other specified forms of tremor: Secondary | ICD-10-CM

## 2018-10-13 DIAGNOSIS — C719 Malignant neoplasm of brain, unspecified: Secondary | ICD-10-CM | POA: Diagnosis not present

## 2018-10-13 NOTE — Patient Instructions (Signed)
1.  Continue clonazepam 0.25mg  twice daily 2.  Follow up in 6 months.

## 2018-10-20 ENCOUNTER — Non-Acute Institutional Stay (SKILLED_NURSING_FACILITY): Payer: Medicare Other | Admitting: Internal Medicine

## 2018-10-20 ENCOUNTER — Encounter: Payer: Self-pay | Admitting: Internal Medicine

## 2018-10-20 ENCOUNTER — Other Ambulatory Visit: Payer: Self-pay | Admitting: Adult Health

## 2018-10-20 DIAGNOSIS — E7849 Other hyperlipidemia: Secondary | ICD-10-CM

## 2018-10-20 DIAGNOSIS — D649 Anemia, unspecified: Secondary | ICD-10-CM | POA: Diagnosis not present

## 2018-10-20 DIAGNOSIS — C719 Malignant neoplasm of brain, unspecified: Secondary | ICD-10-CM | POA: Diagnosis not present

## 2018-10-20 MED ORDER — CLONAZEPAM 0.25 MG PO TBDP: 0.2500 mg | ORAL_TABLET | Freq: Two times a day (BID) | ORAL | 0 refills | Status: DC

## 2018-10-20 NOTE — Assessment & Plan Note (Signed)
Neurologic follow-up as per Dr. Tomi Likens in 04/2019; continue clonazepam at present dose twice daily

## 2018-10-20 NOTE — Progress Notes (Signed)
NURSING HOME LOCATION:  [facility] ROOM NUMBER:  304-B  CODE STATUS:  Full Code  PCP:  Hendricks Limes, MD  [place] [address]  This is a nursing facility follow up of chronic medical diagnoses.  Interim medical record and care since last [facility] visit was updated with review of diagnostic studies and change in clinical status since last visit were documented.  HPI: Michaela [name] is a 63 year old permanent resident of facility with diagnoses of PMH of CNS astrocytoma complicated by neurogenic bladder, diastolic congestive heart failure, PAF, osteoporosis, and anxiety disorder.  Surgeries include surgical resection & radiation of the astrocytoma , VP shunting as well as mitral valve repair for cusp prolapse. Neurology follow-up is current; she saw Dr. Tomi Likens 10/13/2018.  He had prescribed clonazepam for tremor.  He had ordered the MRI 08/27/2018; findings included suboccipital craniectomy with surgical encephalomalacia, gliosis, and chronic blood products related to previous surgery and previous radiation therapy.  There was severe atrophy of the cerebellum and brainstem.  Ballooning of the fourth ventricle was not felt to be significantly different than that found on the MRI in 2018.  Dr. Tomi Likens actually compared the current images to the original films in 2003.  No hydrocephalus following the VP shunting decompression of the lateral and third ventricles was present.  He diagnosed ataxia and cerebellar tremor, chronic and secondary to the prior cerebellar astrocytoma and treatment.  Tremor was felt to be manageable at the at the current doses of clonazepam, 0.25 mg in the morning and 0.25 mg nightly. Her tremor is intermittently exacerbated.  The exact trigger is unknown.  She does validate that the medication causes fatigue.  At this point she feels that the response to the medication is adequate although not ideal. In December 2 of 3 FOBT's were positive.   In April 2019 she exhibited no anemia.  She has developed a slight anemia which is normochromic, normocytic.  Hemoglobin is 11.6 and hematocrit 34.6.  She has a GI appointment 2/18 with Dr. Henrene Pastor as preop evaluation for possible colonoscopy.  She and her sister are concerned that she cannot drink the liquid prep. Her last lipids and liver function tests were performed in April 2019.  Review of systems: She specifically denies any bleeding dyscrasias.  She has no active GI symptoms except she states "I forget to swallow sometimes" & has constipation.  She denies any choking or dysphagia.   She does have urinary incontinence as she has difficulty getting to the bathroom in time intermittently. She has no other GU symptoms.  She has pain in the left neck which she relates to the tremor.  She states that the left eye turns internally intermittently.  Constitutional: No fever, significant weight change Eyes: No redness, discharge, pain, vision change ENT/mouth: No nasal congestion,  purulent discharge, earache, change in hearing, sore throat  Cardiovascular: No chest pain, palpitations, paroxysmal nocturnal dyspnea, claudication, edema  Respiratory: No cough, sputum production, hemoptysis, DOE, significant snoring, apnea   Gastrointestinal: No heartburn, abdominal pain, nausea /vomiting, rectal bleeding, melena, new change in bowels Genitourinary: No dysuria, hematuria, pyuria, nocturia Musculoskeletal: No joint stiffness, joint swelling, weakness, pain Dermatologic: No rash, pruritus, change in appearance of skin Neurologic: No dizziness, headache, syncope, seizures Psychiatric: No significant anxiety, depression, insomnia, anorexia Endocrine: No change in hair/skin/nails, excessive thirst, excessive hunger, excessive urination  Hematologic/lymphatic: No significant bruising, lymphadenopathy, abnormal bleeding Allergy/immunology: No itchy/watery eyes, significant sneezing, urticaria,  angioedema  Physical exam:  Pertinent  or positive findings: She is thin but does not appear cachectic.  She exhibits intermittent left esotropia.  Pedal pulses are decreased.  Limbs are atrophic.  The atrophy is most pronounced in the lower extremities.  She is generally weak to opposition, especially in the left upper extremity.  She has a slight flexion contracture of the fifth right finger. There is an intermittent side to side head tremor.  General appearance:  no acute distress, increased work of breathing is present.   Lymphatic: No lymphadenopathy about the head, neck, axilla. Eyes: No conjunctival inflammation or lid edema is present. There is no scleral icterus. Ears:  External ear exam shows no significant lesions or deformities.   Nose:  External nasal examination shows no deformity or inflammation. Nasal mucosa are pink and moist without lesions, exudates Oral exam:  Lips and gums are healthy appearing. There is no oropharyngeal erythema or exudate. Neck:  No thyromegaly, masses, tenderness noted.    Heart:  Normal rate and regular rhythm. S1 and S2 normal without gallop, murmur, click, rub .  Lungs: Chest clear to auscultation without wheezes, rhonchi, rales, rubs. Abdomen: Bowel sounds are normal. Abdomen is soft and nontender with no organomegaly, hernias, masses. GU: Deferred  Extremities:  No cyanosis, clubbing, edema  Neurologic exam : Balance, Rhomberg, finger to nose testing could not be completed due to clinical state Skin: Warm & dry w/o tenting. No significant lesions or rash.  See summary under each active problem in the Problem List with associated updated therapeutic plan

## 2018-10-20 NOTE — Assessment & Plan Note (Signed)
Fasting lipids and liver function tests indicated

## 2018-10-20 NOTE — Telephone Encounter (Signed)
Refill request pended and forwarded to Durenda Age, NP for approval and transmittal to [facility].

## 2018-10-20 NOTE — Assessment & Plan Note (Addendum)
No bleeding dyscrasia reported Repeat CBC with iron panel She will share her concerns about possible colonoscopy prep with Dr Henrene Pastor

## 2018-10-20 NOTE — Patient Instructions (Signed)
See assessment and plan under each diagnosis in the problem list and acutely for this visit 

## 2018-10-22 ENCOUNTER — Non-Acute Institutional Stay (SKILLED_NURSING_FACILITY): Payer: Medicare Other | Admitting: Adult Health

## 2018-10-22 ENCOUNTER — Encounter: Payer: Self-pay | Admitting: Adult Health

## 2018-10-22 DIAGNOSIS — F419 Anxiety disorder, unspecified: Secondary | ICD-10-CM

## 2018-10-22 DIAGNOSIS — G252 Other specified forms of tremor: Secondary | ICD-10-CM

## 2018-10-22 NOTE — Progress Notes (Signed)
Location:  [facility] Room Number: 304-B Place of Service:  SNF (31) Provider:  Durenda Age, NP  Patient Care Team: Hendricks Limes, MD as PCP - General (Internal Medicine) Larey Dresser, MD as Referring Physician (Cardiology) Larey Dresser, MD (Cardiology) Medina-Vargas, Senaida Lange, NP as Nurse Practitioner (Internal Medicine) Pieter Partridge, DO as Consulting Physician (Neurology)  Extended Emergency Contact Information Primary Emergency Contact: [name], [place] [address] [place] Phone: [phone] Relation: Sister  Code Status:  Full Code  Goals of care: Advanced Directive information Advanced Directives 01/22/2018  Does Patient Have a Medical Advance Directive? No  Type of Advance Directive -  Would patient like information on creating a medical advance directive? No - Patient declined  Pre-existing out of facility DNR order (yellow form or pink MOST form) -     Chief Complaint  Patient presents with  . Acute Visit    Patient is seen to assess the need for continued use of clonazepam.   HPI:  [name] seen today for an acute visit to assess the need for continued clonazepam use. She was seen in the room today. She said that Clonazepam eases her shaking. She was noted shaking from waist up. She is alert and oriented and not sleepy. She is a long-term care resident of [facility].  She has a PMH of astrocytoma, mitral regurgitation, mitral valve prolapse, OA, and osteoporosis.    Past Medical History:  Diagnosis Date  . Astrocytoma brain tumor ([place]) 1971   astrocytoma treated with radiation  . C. difficile colitis 04/222   complicated by sepsis/ARDS and prolonged hosp  . Depression   . Hyperlipidemia   . Mitral regurgitation 10/2011   severe, s/p min invasive repair and annuloplasty  . Mitral valve prolapse   . Neuromuscular disorder (HCC)    numbness in  hands and feet  . Osteoarthritis   . Osteoporosis    DEXA @ [facility] 11/2012: -3.5, prior tx Reclast, last in 2011, rec change to Prolia  . Overactive bladder   . Pleural effusion, left 11/11/2011  . Vitamin D deficiency    Past Surgical History:  Procedure Laterality Date  . CARDIAC CATHETERIZATION     Jan 2013  . KNEE ARTHROSCOPY Right   . MITRAL VALVE REPAIR  10/15/2011   Procedure: MINIMALLY INVASIVE MITRAL VALVE REPAIR (MVR);  Surgeon: Rexene Alberts, MD;  Location: [facility];  Service: Open Heart Surgery;  Laterality: Right;  . TRANSESOPHAGEAL ECHOCARDIOGRAM  7/12  . US ECHOCARDIOGRAPHY  6/12  . VENTRICULOPERITONEAL SHUNT  1971    Allergies  Allergen Reactions  . Compazine Other (See Comments)    "eyes get buggy"    Outpatient Encounter Medications as of 10/22/2018  Medication Sig  . atorvastatin (LIPITOR) 10 MG tablet TAKE ONE TABLET BY MOUTH DAILY  . cholecalciferol (VITAMIN D) 1000 units tablet Take 1,000 Units by mouth daily.  . clonazePAM (KLONOPIN) 0.25 MG disintegrating tablet Take 1 tablet (0.25 mg total) by mouth 2 (two) times daily.  Marland Kitchen denosumab (PROLIA) 60 MG/ML SOLN injection Inject 60 mg into the skin every 6 (six) months. Administer in upper arm, thigh, or abdomen  . escitalopram (LEXAPRO) 10 MG tablet Take 1 tablet (10 mg total) by mouth daily.  Marland Kitchen linaclotide (LINZESS) 145 MCG CAPS capsule TAKE ONE CAPSULE BY MOUTH DAILY BEFORE BREAKFAST  . loratadine (CLARITIN) 10  MG tablet Take 10 mg by mouth daily.  . Menthol, Topical Analgesic, (BIOFREEZE) 4 % GEL Apply 1 application topically 2 (two) times daily. Apply topically BID to sore areas - back  . mirabegron ER (MYRBETRIQ) 50 MG TB24 tablet Take 50 mg by mouth daily.  . polyethylene glycol (MIRALAX / GLYCOLAX) packet Take 17 g by mouth daily as needed for mild constipation or moderate constipation.  . polyvinyl alcohol (LIQUIFILM TEARS) 1.4 % ophthalmic solution Place 1 drop into both eyes 4 (four) times daily.   . psyllium  (METAMUCIL) 58.6 % packet Take 1 packet by mouth every Monday, Wednesday, and Friday.  . senna-docusate (SENOKOT-S) 8.6-50 MG tablet Take 2 tablets by mouth at bedtime.  . White Petrolatum-Mineral Oil (LUBRIFRESH P.M. OP) Apply 1 application to eye at bedtime. Apply to right eye   No facility-administered encounter medications on file as of 10/22/2018.     Review of Systems  GENERAL: No change in appetite, no fatigue, no weight changes, no fever, chills or weakness MOUTH and THROAT: Denies oral discomfort, gingival pain or bleeding RESPIRATORY: no cough, SOB, DOE, wheezing, hemoptysis CARDIAC: No chest pain, edema or palpitations GI: No abdominal pain, diarrhea, constipation, heart burn, nausea or vomiting GU: Denies dysuria, frequency, hematuria, incontinence, or discharge PSYCHIATRIC: Denies feelings of depression or anxiety. No report of hallucinations, insomnia, paranoia, or agitation   Immunization History  Administered Date(s) Administered  . Influenza Split 08/11/2012  . Influenza,inj,Quad PF,6+ Mos 05/14/2013, 07/19/2014, 06/29/2015, 07/01/2016  . Influenza-Unspecified 06/09/2017, 07/07/2018  . PPD Test 01/23/2018  . Pneumococcal Polysaccharide-23 11/23/2011  . Tdap 01/23/2018   Pertinent  Health Maintenance Due  Topic Date Due  . PAP SMEAR-Modifier  12/02/1978  . COLONOSCOPY  07/27/2014  . MAMMOGRAM  04/13/2020  . INFLUENZA VACCINE  Completed   Fall Risk  10/13/2018 03/31/2018 01/22/2018 11/24/2017 06/29/2015  Falls in the past year? 1 Yes Yes Yes Yes  Number falls in past yr: 0 2 or more 1 2 or more 2 or more  Injury with Fall? 0 Yes Yes Yes Yes  Comment - - - - bruising   Risk Factor Category  - High Fall Risk - High Fall Risk High Fall Risk  Risk for fall due to : - - - - Impaired balance/gait;Impaired mobility;History of fall(s)  Follow up Falls evaluation completed - - Falls evaluation completed -     Vitals:   10/22/18 1046  BP: 114/64  Pulse: 86  Resp: 18    Temp: (!) 96.8 F (36 C)  TempSrc: Oral  Weight: 85 lb 6.4 oz (38.7 kg)  Height: 4\' 8"  (1.422 m)   Body mass index is 19.15 kg/m.  Physical Exam  GENERAL APPEARANCE:  In no acute distress.  SKIN:  Skin is warm and dry.  MOUTH and THROAT: Lips are without lesions. Oral mucosa is moist and without lesions. Tongue is normal in shape, size, and color and without lesions RESPIRATORY: Breathing is even & unlabored, BS CTAB CARDIAC: RRR, no murmur,no extra heart sounds, no edema GI: Abdomen soft, normal BS, no masses, no tenderness EXTREMITIES:  Able to move X 4 extremities NEUROLOGICAL: Has tremors from waist up. Speech is clear. Alert and oriented X 3.  PSYCHIATRIC: Affect and behavior are appropriate  Labs reviewed: Recent Labs    12/12/17 01/20/18  NA 141 144  K 4.3 3.9  BUN 18 20  CREATININE 0.5 0.5   Recent Labs    12/12/17  AST 15  ALT 9  ALKPHOS 56   Recent Labs    12/12/17 09/11/18  WBC 6.4 5.4  NEUTROABS 4 3  HGB 12.5 11.6*  HCT 39 35*  PLT 185 170   Lab Results  Component Value Date   TSH 1.15 03/02/2015   Lab Results  Component Value Date   HGBA1C 5.5 10/11/2011   Lab Results  Component Value Date   CHOL 150 12/12/2017   HDL 38 12/12/2017   LDLCALC 101 12/12/2017   LDLDIRECT 165.0 05/14/2013   TRIG 54 12/12/2017   CHOLHDL 4 11/06/2015    Assessment/Plan  1. Cerebellar tremor - still noted to have tremors from waist up, will continue Clonazepam 0.25 mg BID  2. Anxiety - mood is stable, no hallucinations, will continue Clonazepam 0.25 mg BID    Family/ staff Communication: Discussed plan of care with resident.  Labs/tests ordered:  None  Goals of care:   Long-term care.   Durenda Age, NP [facility] [phone] (Monday-Friday 8:00 a.m. - 5:00 p.m.) [phone] (after hours)

## 2018-10-27 ENCOUNTER — Encounter: Payer: Self-pay | Admitting: Internal Medicine

## 2018-10-27 ENCOUNTER — Ambulatory Visit (INDEPENDENT_AMBULATORY_CARE_PROVIDER_SITE_OTHER): Payer: Medicare Other | Admitting: Internal Medicine

## 2018-10-27 VITALS — BP 92/60 | HR 68 | Ht <= 58 in | Wt 86.0 lb

## 2018-10-27 DIAGNOSIS — Z9181 History of falling: Secondary | ICD-10-CM | POA: Diagnosis not present

## 2018-10-27 DIAGNOSIS — K5900 Constipation, unspecified: Secondary | ICD-10-CM | POA: Diagnosis not present

## 2018-10-27 DIAGNOSIS — R195 Other fecal abnormalities: Secondary | ICD-10-CM | POA: Diagnosis not present

## 2018-10-27 DIAGNOSIS — R2681 Unsteadiness on feet: Secondary | ICD-10-CM | POA: Diagnosis not present

## 2018-10-27 MED ORDER — ONDANSETRON HCL 4 MG PO TABS: ORAL_TABLET | ORAL | 0 refills | Status: DC

## 2018-10-27 NOTE — Progress Notes (Signed)
HISTORY OF PRESENT ILLNESS:  Michaela [name] is a 63 y.o. 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  Patient was seen in this office December 2017 regarding chronic functional constipation and colon cancer screening.  See that dictation.  She was accompanied by her sister at that time.  We discussed various screening options at that time.  They prefer Cologuard.  However most recently Hemoccult testing was performed and returned positive.  The patient did undergo colonoscopy in November 2005 for complaints of constipation and abdominal bloating discomfort.  The examination was entirely normal.  She is accompanied today again as is a by her sister.  Review of outside laboratories from April 2019 revealed a hemoglobin of 12.5.  January 2020 hemoglobin 11.6.  Patient continues with chronic constipation which is managed nicely with Linzess therapy except for occasional diarrhea.  And her sister had questions regarding various preparation options.  They are concerned about the large volume of preparation required for her colonoscopy.  Her previous examination was a MiraLAX prep.  The patient currently resides in a assisted living facility.  REVIEW OF SYSTEMS:  All non-GI ROS negative unless otherwise stated in the HPI except for arthritis  Past Medical History:  Diagnosis Date  . Astrocytoma brain tumor ([place]) 1971   astrocytoma treated with radiation  . C. difficile colitis 03/8241   complicated by sepsis/ARDS and prolonged hosp  . Depression   . Hyperlipidemia   . Mitral regurgitation 10/2011   severe, s/p min invasive repair and annuloplasty  . Mitral valve prolapse   . Neuromuscular disorder (HCC)    numbness in hands and feet  . Osteoarthritis   . Osteoporosis    DEXA @ [facility] 11/2012: -3.5, prior tx Reclast, last in 2011, rec change to Prolia  . Overactive bladder   . Pleural effusion,  left 11/11/2011  . Vitamin D deficiency     Past Surgical History:  Procedure Laterality Date  . CARDIAC CATHETERIZATION     Jan 2013  . KNEE ARTHROSCOPY Right   . MITRAL VALVE REPAIR  10/15/2011   Procedure: MINIMALLY INVASIVE MITRAL VALVE REPAIR (MVR);  Surgeon: Rexene Alberts, MD;  Location: [facility];  Service: Open Heart Surgery;  Laterality: Right;  . TRANSESOPHAGEAL ECHOCARDIOGRAM  7/12  . US ECHOCARDIOGRAPHY  6/12  . VENTRICULOPERITONEAL SHUNT  1971    Social History Michaela [name]  reports that she quit smoking about 28 years ago. She has never used smokeless tobacco. She reports previous alcohol use. She reports that she does not use drugs.  family history includes Coronary artery disease (age of onset: 62) in her father; Heart attack in her maternal grandfather; Hyperlipidemia in her mother.  Allergies  Allergen Reactions  . Compazine Other (See Comments)    "eyes get buggy"       PHYSICAL EXAMINATION: Vital signs: BP 92/60   Pulse 68   Ht 4\' 8"  (1.422 m)   Wt 86 lb (39 kg)   BMI 19.28 kg/m   Constitutional: Pleasant, alert, frail and thin, no acute distress.  In a wheelchair Psychiatric: alert and oriented x3, cooperative Eyes: extraocular movements intact, anicteric, conjunctiva pink Mouth: oral pharynx moist, no lesions Neck: supple no lymphadenopathy Cardiovascular: heart regular rate and rhythm, no murmur Lungs: clear to auscultation bilaterally Abdomen: soft, thin, nontender, nondistended, no obvious ascites, no peritoneal signs, normal bowel sounds, no organomegaly Rectal: Deferred until  colonoscopy Extremities: no rubbing, cyanosis, or lower extremity edema bilaterally Skin: no lesions on visible extremities Neuro: Tremor  ASSESSMENT:  1.  Hemoccult positive stool 2.  Multiple neurologic residual effects from remote astrocytoma brain tumor   PLAN:  1.  Colonoscopy.  The patient is HIGH RISK given her comorbidities.  The procedure will be  performed at the hospital with monitored anesthesia care.  I have discussed ways of adjusting her split prep.  Also we will provide Zofran prophylactically to try to reduce potential nausea and vomiting.The nature of the procedure, as well as the risks, benefits, and alternatives were carefully and thoroughly reviewed with the patient. Ample time for discussion and questions allowed. The patient understood, was satisfied, and agreed to proceed.

## 2018-10-27 NOTE — Patient Instructions (Addendum)
You have been scheduled for a colonoscopy. Please follow written instructions given to you at your visit today.  Please pick up your prep supplies at the pharmacy within the next 1-3 days. If you use inhalers (even only as needed), please bring them with you on the day of your procedure. Your physician has requested that you go to www.startemmi.com and enter the access code given to you at your visit today. This web site gives a general overview about your procedure. However, you should still follow specific instructions given to you by our office regarding your preparation for the procedure.  We have sent the following medications to your pharmacy for you to pick up at your convenience:  Zofran - take one tablet 20 minutes before drinking each half of the prep

## 2018-10-28 ENCOUNTER — Encounter: Payer: Self-pay | Admitting: Internal Medicine

## 2018-11-04 DIAGNOSIS — F3341 Major depressive disorder, recurrent, in partial remission: Secondary | ICD-10-CM | POA: Diagnosis not present

## 2018-11-04 DIAGNOSIS — F419 Anxiety disorder, unspecified: Secondary | ICD-10-CM | POA: Diagnosis not present

## 2018-11-06 ENCOUNTER — Encounter: Payer: Self-pay | Admitting: Internal Medicine

## 2018-11-06 ENCOUNTER — Non-Acute Institutional Stay (SKILLED_NURSING_FACILITY): Payer: Medicare Other | Admitting: Internal Medicine

## 2018-11-06 DIAGNOSIS — Z8669 Personal history of other diseases of the nervous system and sense organs: Secondary | ICD-10-CM

## 2018-11-06 DIAGNOSIS — R059 Cough, unspecified: Secondary | ICD-10-CM

## 2018-11-06 DIAGNOSIS — R195 Other fecal abnormalities: Secondary | ICD-10-CM

## 2018-11-06 DIAGNOSIS — R05 Cough: Secondary | ICD-10-CM | POA: Diagnosis not present

## 2018-11-06 NOTE — Progress Notes (Signed)
Location:  [facility] Room Number: 304-B Place of Service:  SNF [phone]) Provider: Granville Lewis, PA  Patient Care Team: Hendricks Limes, MD as PCP - General (Internal Medicine) Larey Dresser, MD as Referring Physician (Cardiology) Larey Dresser, MD (Cardiology) Medina-Vargas, Senaida Lange, NP as Nurse Practitioner (Internal Medicine) Pieter Partridge, DO as Consulting Physician (Neurology)  Extended Emergency Contact Information Primary Emergency Contact: [name], [place] [address] [place] Phone: [phone] Relation: Sister  Code Status: Full Code Goals of Care: Advanced Directive information Advanced Directives 01/22/2018  Does Patient Have a Medical Advance Directive? No  Type of Advance Directive -  Would patient like information on creating a medical advance directive? No - Patient declined  Pre-existing out of facility DNR order (yellow form or pink MOST form) -     Chief Complaint  Patient presents with  . Acute Visit    Patient with complaint of cough.    HPI: Patient is a 63 y.o. female seen today complaints of a cough.  Patient is a long-term resident of facility with a history [place] of astrocytoma  As well as mitral regurgitation mitral valve prolapse osteoarthritis and osteoporosis.  Her sister noted today that she has somewhat of a cough and would like her to be seen.  Patient herself is not really complaining of any increased shortness of breath beyond baseline she does describe this as a cough productive of some white phlegm.  She has been afebrile O2 saturations and vital signs appear to be stable.    Depression screen [facility] 2/9 01/22/2018 11/24/2017 11/11/2012  Decreased Interest 0 0 0  Down, Depressed, Hopeless 0 0 0  PHQ - 2 Score 0 0 0    Fall Risk  10/13/2018 03/31/2018 01/22/2018 11/24/2017 06/29/2015  Falls in the past year? 1 Yes Yes Yes Yes  Number falls in past yr: 0 2 or more 1 2 or more 2 or  more  Injury with Fall? 0 Yes Yes Yes Yes  Comment - - - - bruising   Risk Factor Category  - High Fall Risk - High Fall Risk High Fall Risk  Risk for fall due to : - - - - Impaired balance/gait;Impaired mobility;History of fall(s)  Follow up Falls evaluation completed - - Falls evaluation completed -    Health Maintenance  Topic Date Due  . PAP SMEAR-Modifier  12/02/1978  . COLONOSCOPY  07/27/2014  . Hepatitis C Screening  01/09/2019 (Originally 1955-12-22)  . HIV Screening  01/09/2019 (Originally 12/01/1970)  . MAMMOGRAM  04/13/2020  . TETANUS/TDAP  01/24/2028  . INFLUENZA VACCINE  Completed    Urinary incontinence? Functional Status Survey:   Exercise? Diet? No exam data present Hearing:   Dentition: Pain:  Past Medical History:  Diagnosis Date  . Astrocytoma brain tumor ([facility]) 1971   astrocytoma treated with radiation  . C. difficile colitis 12/7827   complicated by sepsis/ARDS and prolonged hosp  . Depression   . Hyperlipidemia   . Mitral regurgitation 10/2011   severe, s/p min invasive repair and annuloplasty  . Mitral valve prolapse   . Neuromuscular disorder (HCC)    numbness in hands and feet  . Osteoarthritis   . Osteoporosis    DEXA @ [facility] 11/2012: -3.5, prior tx Reclast, last in 2011, rec change to Prolia  . Overactive bladder   . Pleural effusion, left 11/11/2011  . Vitamin D deficiency  Past Surgical History:  Procedure Laterality Date  . CARDIAC CATHETERIZATION     Jan 2013  . KNEE ARTHROSCOPY Right   . MITRAL VALVE REPAIR  10/15/2011   Procedure: MINIMALLY INVASIVE MITRAL VALVE REPAIR (MVR);  Surgeon: Rexene Alberts, MD;  Location: [facility];  Service: Open Heart Surgery;  Laterality: Right;  . TRANSESOPHAGEAL ECHOCARDIOGRAM  7/12  . US ECHOCARDIOGRAPHY  6/12  . VENTRICULOPERITONEAL SHUNT  1971    Family History  Problem Relation Age of Onset  . Coronary artery disease Father 11       died MI  . Hyperlipidemia Mother   . Heart attack Maternal  Grandfather     Social History   Socioeconomic History  . Marital status: Single    Spouse name: Not on file  . Number of children: 0  . Years of education: Not on file  . Highest education level: Not on file  Occupational History  . Occupation: disabled  Social Needs  . Financial resource strain: Not hard at all  . Food insecurity:    Worry: Never true    Inability: Never true  . Transportation needs:    Medical: No    Non-medical: No  Tobacco Use  . Smoking status: Former Smoker    Last attempt to quit: 09/09/1990    Years since quitting: 28.1  . Smokeless tobacco: Never Used  Substance and Sexual Activity  . Alcohol use: Not Currently    Alcohol/week: 0.0 standard drinks    Comment: rarely  . Drug use: No  . Sexual activity: Not on file  Lifestyle  . Physical activity:    Days per week: 0 days    Minutes per session: 0 min  . Stress: Only a little  Relationships  . Social connections:    Talks on phone: Three times a week    Gets together: Three times a week    Attends religious service: Never    Active member of club or organization: Not on file    Attends meetings of clubs or organizations: Never    Relationship status: Never married  Other Topics Concern  . Not on file  Social History Narrative   [facility] in SNF.      Patient is right-handed. She is a permanent resident of [facility].      reports that she quit smoking about 28 years ago. She has never used smokeless tobacco. She reports previous alcohol use. She reports that she does not use drugs.   Allergies  Allergen Reactions  . Compazine Other (See Comments)    "eyes get buggy"    Outpatient Encounter Medications as of 11/06/2018  Medication Sig  . atorvastatin (LIPITOR) 10 MG tablet TAKE ONE TABLET BY MOUTH DAILY  . bisacodyl (DULCOLAX) 10 MG suppository Place 10 mg rectally as needed for moderate constipation.  . cholecalciferol (VITAMIN  D) 1000 units tablet Take 1,000 Units by mouth daily.  . clonazePAM (KLONOPIN) 0.25 MG disintegrating tablet Take 1 tablet (0.25 mg total) by mouth 2 (two) times daily.  Marland Kitchen denosumab (PROLIA) 60 MG/ML SOLN injection Inject 60 mg into the skin every 6 (six) months. Administer in upper arm, thigh, or abdomen  . escitalopram (LEXAPRO) 10 MG tablet Take 1 tablet (10 mg total) by mouth daily.  . hydroxypropyl methylcellulose / hypromellose (ISOPTO TEARS / GONIOVISC) 2.5 % ophthalmic solution Place 1 drop into both eyes 4 (four) times daily.  Marland Kitchen linaclotide (LINZESS) 145 MCG  CAPS capsule TAKE ONE CAPSULE BY MOUTH DAILY BEFORE BREAKFAST  . loratadine (CLARITIN) 10 MG tablet Take 10 mg by mouth daily.  . Menthol, Topical Analgesic, (BIOFREEZE) 4 % GEL Apply 1 application topically 2 (two) times daily. Apply topically BID to sore areas - back  . mirabegron ER (MYRBETRIQ) 50 MG TB24 tablet Take 50 mg by mouth daily.  . polyethylene glycol (MIRALAX / GLYCOLAX) packet Take 17 g by mouth daily as needed for mild constipation or moderate constipation.  . polyvinyl alcohol (LIQUIFILM TEARS) 1.4 % ophthalmic solution Place 1 drop into both eyes 4 (four) times daily.   . psyllium (METAMUCIL) 58.6 % packet Take 1 packet by mouth every Monday, Wednesday, and Friday.  . senna-docusate (SENOKOT-S) 8.6-50 MG tablet Take 2 tablets by mouth at bedtime.  . White Petrolatum-Mineral Oil (LUBRIFRESH P.M. OP) Apply 1 application to eye at bedtime. Apply to right eye  . [DISCONTINUED] ondansetron (ZOFRAN) 4 MG tablet Take one tablet by mouth about 20 minutes before drinking each half of the prep   No facility-administered encounter medications on file as of 11/06/2018.      Review of Systems:  Review of Systems   In general she is not complaining of any fever or chills.  Skin is not complaining of rashes or itching.  Head ears eyes nose mouth and throat is not complaining of visual changes or sore throat.  Respiratory  does complain of a cough productive of white phlegm does not really complain of shortness of breath beyond baseline.  Says at times with exertion she gets a little short of breath but this has not acutely changed in the last few days.  Cardiac is not complaining of chest pain or edema.  GI does not complain of abdominal pain apparently has a very good appetite does not complain of nausea vomiting diarrhea or constipation.  GU is not complaining of dysuria.  Musculoskeletal does not complain of joint pain currently.  Neurologic does have a history of tremor does not complain of dizziness or headache.  And psych does not complain of being depressed or anxious appears to be in good spirits.  Temperature is 97.0 pulse 94 respirations 16 blood pressure 113/69 oxygen saturation is 93% on room air  Physical Exam: Of note updated blood pressure is 113/69 Body mass index is 19.15 kg/m. Physical Exam   In general this is a pleasant female in no distress sitting comfortably in her wheelchair.  Her skin is warm and dry.  Eyes visual acuity appears to be intact sclera and conjunctive are clear.  Oropharynx is clear mucous membranes moist I do not really note any increased erythema of her throat.  Chest is clear to auscultation there is no labored breathing.  Heart is regular rate and rhythm without murmur gallop or rub she does not have significant lower extremity edema.  Abdomen appears to be soft nontender with positive bowel sounds.  Musculoskeletal does have somewhat generalized atrophy- ambulates in a wheelchair.  Neurologic as noted above she does have at times an intermittent tremor.  She is alert appears able to move all extremities at baseline  Psych she is alert and oriented pleasant and appropriate  Labs reviewed: Basic Metabolic Panel: Recent Labs    12/12/17 01/20/18  NA 141 144  K 4.3 3.9  BUN 18 20  CREATININE 0.5 0.5   Liver Function Tests: Recent Labs     12/12/17  AST 15  ALT 9  ALKPHOS 56   CBC:  Recent Labs    12/12/17 09/11/18  WBC 6.4 5.4  NEUTROABS 4 3  HGB 12.5 11.6*  HCT 39 35*  PLT 185 170   Lipid Panel: Recent Labs    12/12/17  CHOL 150  HDL 38  LDLCALC 101  TRIG 54   Lab Results  Component Value Date   HGBA1C 5.5 10/11/2011     Assessment/Plan  #1 cough-physical exam was fairly benign-vital signs and oxygen saturation appear to be stable- will start Mucinex 600 mg twice daily for 5 days and monitor with vital signs and pulse ox every shift for 72 hours to keep an eye on her- if increased cough congestion fever or shortness of breath notify provider.  2.  I do note she has a history of a Hemoccult positive stool back in December she has been followed by GI and will have a colonoscopy will update a CBC last hemoglobin was 11.6 in early January which was slightly down from previous level clinically she appears to be stable.  It appears to have been extensive discussions with GI physician and decision was made to go ahead and do a colonoscopy.  She also has a history of significant constipation which has been followed by GI she is on Linzess as well as senna.  Apparently this is stabilized.  3.  History of cerebellar tremor- she continues on clonazepam and appears to still benefit from receiving this.  This is somewhat complicated with a history of anxiety clonazepam apparently is effective  And well  tolerated   [id number]

## 2018-11-09 ENCOUNTER — Other Ambulatory Visit: Payer: Self-pay | Admitting: Internal Medicine

## 2018-11-09 DIAGNOSIS — D649 Anemia, unspecified: Secondary | ICD-10-CM | POA: Diagnosis not present

## 2018-11-09 DIAGNOSIS — I739 Peripheral vascular disease, unspecified: Secondary | ICD-10-CM | POA: Diagnosis not present

## 2018-11-09 DIAGNOSIS — L603 Nail dystrophy: Secondary | ICD-10-CM | POA: Diagnosis not present

## 2018-11-09 DIAGNOSIS — I4891 Unspecified atrial fibrillation: Secondary | ICD-10-CM | POA: Diagnosis not present

## 2018-11-09 DIAGNOSIS — B351 Tinea unguium: Secondary | ICD-10-CM | POA: Diagnosis not present

## 2018-11-09 DIAGNOSIS — I10 Essential (primary) hypertension: Secondary | ICD-10-CM | POA: Diagnosis not present

## 2018-11-09 LAB — BASIC METABOLIC PANEL
BUN: 27 — AB (ref 4–21)
Creatinine: 0.4 — AB (ref 0.5–1.1)
Glucose: 87
POTASSIUM: 4 (ref 3.4–5.3)
Sodium: 140 (ref 137–147)

## 2018-11-09 LAB — CBC AND DIFFERENTIAL
HCT: 33 — AB (ref 36–46)
Hemoglobin: 11 — AB (ref 12.0–16.0)
Platelets: 190 (ref 150–399)
WBC: 5.7

## 2018-11-09 MED ORDER — CLONAZEPAM 0.25 MG PO TBDP: 0.2500 mg | ORAL_TABLET | Freq: Two times a day (BID) | ORAL | 0 refills | Status: DC

## 2018-11-10 LAB — BASIC METABOLIC PANEL  EGFR
Calcium: 8.6
Carbon Dioxide, Total: 26
Chloride: 105

## 2018-11-16 ENCOUNTER — Encounter: Payer: Self-pay | Admitting: Internal Medicine

## 2018-11-16 ENCOUNTER — Non-Acute Institutional Stay (SKILLED_NURSING_FACILITY): Payer: Medicare Other | Admitting: Internal Medicine

## 2018-11-16 DIAGNOSIS — R195 Other fecal abnormalities: Secondary | ICD-10-CM | POA: Diagnosis not present

## 2018-11-16 DIAGNOSIS — E785 Hyperlipidemia, unspecified: Secondary | ICD-10-CM | POA: Diagnosis not present

## 2018-11-16 DIAGNOSIS — R300 Dysuria: Secondary | ICD-10-CM

## 2018-11-16 DIAGNOSIS — R319 Hematuria, unspecified: Secondary | ICD-10-CM | POA: Diagnosis not present

## 2018-11-16 DIAGNOSIS — W19XXXD Unspecified fall, subsequent encounter: Secondary | ICD-10-CM

## 2018-11-16 DIAGNOSIS — N319 Neuromuscular dysfunction of bladder, unspecified: Secondary | ICD-10-CM | POA: Diagnosis not present

## 2018-11-16 DIAGNOSIS — N390 Urinary tract infection, site not specified: Secondary | ICD-10-CM | POA: Diagnosis not present

## 2018-11-16 NOTE — Progress Notes (Signed)
Location:    [place] Room Number: 304/B Place of Service:  SNF [phone]) Provider:  Loraine Maple, MD  Patient Care Team: Hendricks Limes, MD as PCP - General (Internal Medicine) Larey Dresser, MD as Referring Physician (Cardiology) Larey Dresser, MD (Cardiology) Medina-Vargas, Senaida Lange, NP as Nurse Practitioner (Internal Medicine) Pieter Partridge, DO as Consulting Physician (Neurology)  Extended Emergency Contact Information Primary Emergency Contact: [name], [place] [address] [place] Phone: [phone] Relation: Sister  Code Status:  Full Code Goals of care: Advanced Directive information Advanced Directives 11/16/2018  Does Patient Have a Medical Advance Directive? Yes  Type of Advance Directive (No Data)  Does patient want to make changes to medical advance directive? No - Patient declined  Would patient like information on creating a medical advance directive? -  Pre-existing out of facility DNR order (yellow form or pink MOST form) -     Chief Complaint  Patient presents with  . Acute Visit    Acute visit  Secondary to dysuria  HPI:  [name] seen today for an acute visit for  Follow-up of dysuria.  And is a long-term resident of facility with a history of PMH of astrocytoma-as well as mitral regurgitation osteoarthritis and osteoporosis.  She also has had an Hemoccult positive stool and is followed by GI and apparently will be having colonoscopy.  She also has a history of chronic constipation and this appears to have improved with Linzess.  She does have a history of neurogenic bladder she is on Myrbetriq-apparently she has been complaining of some increased dysuria recently.  She also apparently recently had a fall apparently she was in the restroom and did not call for help and apparently tried to change her briefs fell out of her wheelchair-she says she has a small  abrasion to the right thigh area but is not really complaining of any acute discomfort or limitation of movement from baseline-there was no loss of consciousness syncope.  Currently she is sitting in her wheelchair comfortably continues to complain of some dysuria per staff vital signs are stable she is afebrile  Past Medical History:  Diagnosis Date  . Astrocytoma brain tumor ([place]) 1971   astrocytoma treated with radiation  . C. difficile colitis 12/4032   complicated by sepsis/ARDS and prolonged hosp  . Depression   . Hyperlipidemia   . Mitral regurgitation 10/2011   severe, s/p min invasive repair and annuloplasty  . Mitral valve prolapse   . Neuromuscular disorder ([facility])    numbness in hands and feet  . Osteoarthritis   . Osteoporosis    DEXA @ [facility] 11/2012: -3.5, prior tx Reclast, last in 2011, rec change to Prolia  . Overactive bladder   . Pleural effusion, left 11/11/2011  . Vitamin D deficiency    Past Surgical History:  Procedure Laterality Date  . CARDIAC CATHETERIZATION     Jan 2013  . KNEE ARTHROSCOPY Right   . MITRAL VALVE REPAIR  10/15/2011   Procedure: MINIMALLY INVASIVE MITRAL VALVE REPAIR (MVR);  Surgeon: Rexene Alberts, MD;  Location: [facility];  Service: Open Heart Surgery;  Laterality: Right;  . TRANSESOPHAGEAL ECHOCARDIOGRAM  7/12  . US ECHOCARDIOGRAPHY  6/12  . VENTRICULOPERITONEAL SHUNT  1971    Allergies  Allergen Reactions  . Compazine Other (See Comments)    "eyes get buggy"  Outpatient Encounter Medications as of 11/16/2018  Medication Sig  . atorvastatin (LIPITOR) 10 MG tablet TAKE ONE TABLET BY MOUTH DAILY  . bisacodyl (DULCOLAX) 10 MG suppository Constipation (2 of 4): If not relieved by MOM, give 10 mg Bisacodyl suppositiory rectally X 1 dose in 24 hours as needed (Do not use constipation standing orders for residents with renal failure/CFR less than 30. Contact MD for orders)  . cholecalciferol (VITAMIN D) 1000 units tablet Take 1,000 Units by mouth  daily.  . clonazePAM (KLONOPIN) 0.25 MG disintegrating tablet Take 1 tablet (0.25 mg total) by mouth 2 (two) times daily.  Marland Kitchen denosumab (PROLIA) 60 MG/ML SOLN injection Inject 60 mg into the skin every 6 (six) months. Administer in upper arm, thigh, or abdomen  . escitalopram (LEXAPRO) 10 MG tablet Take 1 tablet (10 mg total) by mouth daily.  Marland Kitchen linaclotide (LINZESS) 145 MCG CAPS capsule TAKE ONE CAPSULE BY MOUTH DAILY BEFORE BREAKFAST  . loratadine (CLARITIN) 10 MG tablet Take 10 mg by mouth daily.  . Menthol, Topical Analgesic, (BIOFREEZE) 4 % GEL Apply 1 application topically 2 (two) times daily. Apply topically BID to sore areas - back  . mirabegron ER (MYRBETRIQ) 50 MG TB24 tablet Take 50 mg by mouth daily.  . polyethylene glycol (MIRALAX / GLYCOLAX) packet Take 17 g by mouth daily as needed for mild constipation or moderate constipation.  . polyvinyl alcohol (LIQUIFILM TEARS) 1.4 % ophthalmic solution Place 1 drop into both eyes 4 (four) times daily.   . psyllium (METAMUCIL) 58.6 % packet Take 1 packet by mouth every Monday, Wednesday, and Friday.  . senna-docusate (SENOKOT-S) 8.6-50 MG tablet Take 2 tablets by mouth at bedtime.  . Sodium Phosphates (RA SALINE ENEMA RE) Constipation (3 of 4): If not relieved by Biscodyl suppository, give disposable Saline Enema rectally X 1 dose/24 hrs as needed (Do not use constipation standing orders for residents with renal failure/CFR less than 30. Contact MD for orders)  . White Petrolatum-Mineral Oil (LUBRIFRESH P.M. OP) Apply 1 application to eye at bedtime. Apply to right eye  . [DISCONTINUED] hydroxypropyl methylcellulose / hypromellose (ISOPTO TEARS / GONIOVISC) 2.5 % ophthalmic solution Place 1 drop into both eyes 4 (four) times daily.   No facility-administered encounter medications on file as of 11/16/2018.     Review of Systems   General is is not complaining of any fever chills.  Skin does not complain of rashes or itching she does have a  small abrasion upper right thigh area.  Head ears eyes nose mouth and throat is not complaining of throat.  Respiratory cough apparently appears to have improved I did see her recently for this she is not complaining of any shortness of breath or cough today.  Cardiac does not complain of chest pain or increased edema.   GU again has complaints of dysuria a history of neurogenic bladder.  Musculoskeletal is not complaining of joint pain at this time  Neurologic is not complaining of dizziness headache    Psych--history of anxiety but appears well controlled on current clonazepam dose of 0.25 mg twice daily    Immunization History  Administered Date(s) Administered  . Influenza Split 08/11/2012  . Influenza,inj,Quad PF,6+ Mos 05/14/2013, 07/19/2014, 06/29/2015, 07/01/2016  . Influenza-Unspecified 06/09/2017, 07/07/2018  . PPD Test 01/23/2018  . Pneumococcal Polysaccharide-23 11/23/2011  . Tdap 01/23/2018   Pertinent  Health Maintenance Due  Topic Date Due  . PAP SMEAR-Modifier  12/17/2018 (Originally 12/02/1978)  . COLONOSCOPY  12/17/2018 (Originally 07/27/2014)  .  MAMMOGRAM  04/13/2020  . INFLUENZA VACCINE  Completed   Fall Risk  10/13/2018 03/31/2018 01/22/2018 11/24/2017 06/29/2015  Falls in the past year? 1 Yes Yes Yes Yes  Number falls in past yr: 0 2 or more 1 2 or more 2 or more  Injury with Fall? 0 Yes Yes Yes Yes  Comment - - - - bruising   Risk Factor Category  - High Fall Risk - High Fall Risk High Fall Risk  Risk for fall due to : - - - - Impaired balance/gait;Impaired mobility;History of fall(s)  Follow up Falls evaluation completed - - Falls evaluation completed -   Functional Status Survey:    Temperature is 98.1 pulse 89 respirations 16 blood pressure 104/54 O2 saturation is 92  Physical Exam  General this is a very pleasant female in no distress sitting comfortably in her wheelchair at baseline.  Her skin is warm and dry she does have a small abrasion  right thigh area this does not appear to be cellulitic it is slightly tender to palpation there is no elevation or warmth.  Eyes visual acuity appears to be intact sclera and conjunctive are clear.  Oropharynx is clear mucous membranes moist.  Chest is clear to auscultation with somewhat shallow air entry there is no labored breathing.  Heart is regular rate and rhythm without murmur gallop or rub she does not have significant lower extremity edema.  Abdomen is soft nontender with positive bowel sounds.  Musculoskeletal continues with generalized atrophy--does move her extremities at baseline is able to flex and extend somewhat at the hip without any discomfort is able to raise both legs bilaterally without discomfort did not note any deformities at the hip or of the legs other than chronic.  Neurologic is grossly intact her speech is clear continues with an intermittent tremor.  Psych she is alert and oriented very pleasant and appropriate   Labs reviewed: Recent Labs    12/12/17 01/20/18 11/09/18  NA 141 144 140  K 4.3 3.9 4.0  CL  --   --  105  CO2  --   --  26  BUN 18 20 27*  CREATININE 0.5 0.5 0.4*  CALCIUM  --   --  8.6   Recent Labs    12/12/17  AST 15  ALT 9  ALKPHOS 56   Recent Labs    12/12/17 09/11/18 11/09/18  WBC 6.4 5.4 5.7  NEUTROABS 4 3  --   HGB 12.5 11.6* 11.0*  HCT 39 35* 33*  PLT 185 170 190   Lab Results  Component Value Date   TSH 1.15 03/02/2015   Lab Results  Component Value Date   HGBA1C 5.5 10/11/2011   Lab Results  Component Value Date   CHOL 150 12/12/2017   HDL 38 12/12/2017   LDLCALC 101 12/12/2017   LDLDIRECT 165.0 05/14/2013   TRIG 54 12/12/2017   CHOLHDL 4 11/06/2015    Significant Diagnostic Results in last 30 days:  No results found.  Assessment/Plan  #1-history of dysuria with neurogenic bladder this point will monitor and obtain a urinalysis and culture.  2.  History of fall with no apparent injury other than a  small abrasion-at this point will monitor physical exam was quite benign she is not really complaining of pain g   3- history of hyperlipidemia she is on a statin will update lipid panel as well as liver function tests   #4 history of Hemoccult positive stool hemoglobin at 11.0  this down slightly she will be having a colonoscopy apparently by GI  #5 history of osteoporosis continues on Prolia.  [id number]

## 2018-11-17 ENCOUNTER — Encounter: Payer: Self-pay | Admitting: Internal Medicine

## 2018-11-17 DIAGNOSIS — Z79899 Other long term (current) drug therapy: Secondary | ICD-10-CM | POA: Diagnosis not present

## 2018-11-17 DIAGNOSIS — E785 Hyperlipidemia, unspecified: Secondary | ICD-10-CM | POA: Diagnosis not present

## 2018-11-20 DIAGNOSIS — R270 Ataxia, unspecified: Secondary | ICD-10-CM | POA: Diagnosis not present

## 2018-11-20 DIAGNOSIS — Z85841 Personal history of malignant neoplasm of brain: Secondary | ICD-10-CM | POA: Diagnosis not present

## 2018-11-20 DIAGNOSIS — R278 Other lack of coordination: Secondary | ICD-10-CM | POA: Diagnosis not present

## 2018-11-20 DIAGNOSIS — M6281 Muscle weakness (generalized): Secondary | ICD-10-CM | POA: Diagnosis not present

## 2018-11-23 ENCOUNTER — Other Ambulatory Visit: Payer: Self-pay

## 2018-11-23 ENCOUNTER — Telehealth: Payer: Self-pay | Admitting: Internal Medicine

## 2018-11-23 MED ORDER — CLONAZEPAM 0.25 MG PO TBDP: 0.2500 mg | ORAL_TABLET | Freq: Two times a day (BID) | ORAL | 0 refills | Status: DC

## 2018-11-23 NOTE — Telephone Encounter (Signed)
Michaela [name] [phone] [facility]  Hilda Blades called would like to cancel the appt at [facility] that is on 11-30-18. States that kim(sister is busy and can not call) you can [name] or the pt sister [name]) to cancel

## 2018-11-24 NOTE — Telephone Encounter (Signed)
Spoke with patient's sister and confirmed that patient wanted to cancel her procedure at [facility] next week due to concerns about possible exposure to [place] Virus or other infections while in the hospital  Called Endo and cancelled procedure.

## 2018-11-30 ENCOUNTER — Ambulatory Visit (HOSPITAL_COMMUNITY): Admit: 2018-11-30 | Payer: Medicare Other | Admitting: Internal Medicine

## 2018-11-30 ENCOUNTER — Encounter (HOSPITAL_COMMUNITY): Payer: Self-pay

## 2018-11-30 SURGERY — COLONOSCOPY WITH PROPOFOL
Anesthesia: Monitor Anesthesia Care

## 2018-12-02 ENCOUNTER — Non-Acute Institutional Stay (SKILLED_NURSING_FACILITY): Payer: Medicare Other | Admitting: Internal Medicine

## 2018-12-02 ENCOUNTER — Encounter: Payer: Self-pay | Admitting: Internal Medicine

## 2018-12-02 DIAGNOSIS — C719 Malignant neoplasm of brain, unspecified: Secondary | ICD-10-CM | POA: Diagnosis not present

## 2018-12-02 DIAGNOSIS — D649 Anemia, unspecified: Secondary | ICD-10-CM | POA: Diagnosis not present

## 2018-12-02 DIAGNOSIS — E785 Hyperlipidemia, unspecified: Secondary | ICD-10-CM | POA: Diagnosis not present

## 2018-12-02 DIAGNOSIS — F418 Other specified anxiety disorders: Secondary | ICD-10-CM

## 2018-12-02 DIAGNOSIS — N319 Neuromuscular dysfunction of bladder, unspecified: Secondary | ICD-10-CM

## 2018-12-02 DIAGNOSIS — M818 Other osteoporosis without current pathological fracture: Secondary | ICD-10-CM | POA: Diagnosis not present

## 2018-12-02 LAB — CBC AND DIFFERENTIAL
HCT: 36 (ref 36–46)
Hemoglobin: 12 (ref 12.0–16.0)
WBC: 4.8

## 2018-12-02 NOTE — Progress Notes (Signed)
Location:    [place] Room Number: 304/B Place of Service:  SNF [phone]) Provider:  [place]ville Lewis PA-C  Hendricks Limes, MD  Patient Care Team: Hendricks Limes, MD as PCP - General (Internal Medicine) Larey Dresser, MD as Referring Physician (Cardiology) Larey Dresser, MD (Cardiology) Medina-Vargas, Senaida Lange, NP as Nurse Practitioner (Internal Medicine) Pieter Partridge, DO as Consulting Physician (Neurology)  Extended Emergency Contact Information Primary Emergency Contact: [name], [place] [address] [place] Phone: [phone] Relation: Sister  Code Status:  Full Code Goals of care: Advanced Directive information Advanced Directives 12/02/2018  Does Patient Have a Medical Advance Directive? Yes  Type of Advance Directive (No Data)  Does patient want to make changes to medical advance directive? No - Patient declined  Would patient like information on creating a medical advance directive? -  Pre-existing out of facility DNR order (yellow form or pink MOST form) -     Chief complaint routine visit for medical management of chronic medical conditions including history of cerebellar tremor- constipation- hyperlipidemia-history of occult positive stool- osteoporosis- overactive bladder-depression  HPI:  [name] seen today for medical management of chronic diseases as noted above..  Patient appears to be having a period of stability- I recently saw for a cough which appears to have resolved remarkably she did get a course of Mucinex.  She also at one point complained of dysuria but urine culture did not really grow out anything of significance she is not complaining of dysuria today.  She is actually celebrating her birthday today.  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  She does have a recent history of an occult positive stool hemoglobin has shown relative stability has slightly gone down- she did have a colonoscopy scheduled but this has been postponed because of the coronavirus concerns.  In regards constipation she has been seen by GI for this as well and this appears to have stabilized on Linzess as well as senna.  She does have a history of osteoarthritis this appears to be controlled she does have an order for Biofreeze back.  Regards to hyperlipidemia she is on atorvastatin 10 mg a day recent LDL was mildly elevated at 114.  She also has a history of osteoporosis she is on Prolia every 6 months.  She also has a history of overactive bladder and this appears to be controlled on Myrbetriq ER 50 mg daily currently she is sitting in her room comfortably has no complaints she is finishing her lunch  Apparently her family will be around later y they can only talk to her through the window because of visitor restrictions because of the coronavirus issue     .     Past Medical History:  Diagnosis Date  . Astrocytoma brain tumor ([place]) 1971   astrocytoma treated with radiation  . C. difficile colitis 03/1244   complicated by sepsis/ARDS and prolonged hosp  . Depression   . Hyperlipidemia   . Mitral regurgitation 10/2011   severe, s/p min invasive repair and annuloplasty  . Mitral valve prolapse   . Neuromuscular  disorder ([facility])    numbness in hands and feet  . Osteoarthritis   . Osteoporosis    DEXA @ [facility] 11/2012: -3.5, prior tx Reclast, last in 2011, rec change to Prolia  . Overactive bladder   . Pleural effusion, left 11/11/2011  . Vitamin D deficiency    Past Surgical History:  Procedure Laterality Date  . CARDIAC CATHETERIZATION     Jan 2013  . KNEE ARTHROSCOPY Right   . MITRAL VALVE  REPAIR  10/15/2011   Procedure: MINIMALLY INVASIVE MITRAL VALVE REPAIR (MVR);  Surgeon: Rexene Alberts, MD;  Location: [facility];  Service: Open Heart Surgery;  Laterality: Right;  . TRANSESOPHAGEAL ECHOCARDIOGRAM  7/12  . US ECHOCARDIOGRAPHY  6/12  . VENTRICULOPERITONEAL SHUNT  1971    Allergies  Allergen Reactions  . Compazine Other (See Comments)    "eyes get buggy"    Allergies as of 12/02/2018      Reactions   Compazine Other (See Comments)   "eyes get buggy"      Medication List       Accurate as of December 02, 2018 10:42 AM. Always use your most recent med list.        atorvastatin 10 MG tablet Commonly known as:  LIPITOR TAKE ONE TABLET BY MOUTH DAILY   Biofreeze 4 % Gel Generic drug:  Menthol (Topical Analgesic) Apply 1 application topically 2 (two) times daily. Apply topically BID to sore areas - back   bisacodyl 10 MG suppository Commonly known as:  DULCOLAX Constipation (2 of 4): If not relieved by MOM, give 10 mg Bisacodyl suppositiory rectally X 1 dose in 24 hours as needed (Do not use constipation standing orders for residents with renal failure/CFR less than 30. Contact MD for orders)   cholecalciferol 25 MCG (1000 UT) tablet Commonly known as:  VITAMIN D Take 1,000 Units by mouth daily.   clonazePAM 0.25 MG disintegrating tablet Commonly known as:  KLONOPIN Take 1 tablet (0.25 mg total) by mouth 2 (two) times daily.   denosumab 60 MG/ML Soln injection Commonly known as:  PROLIA Inject 60 mg into the skin every 6 (six) months. Administer in upper arm, thigh, or abdomen   escitalopram 10 MG tablet Commonly known as:  LEXAPRO Take 1 tablet (10 mg total) by mouth daily.   linaclotide 145 MCG Caps capsule Commonly known as:  Linzess TAKE ONE CAPSULE BY MOUTH DAILY BEFORE BREAKFAST   loratadine 10 MG tablet Commonly known as:  CLARITIN Take 10 mg by mouth daily.   LUBRIFRESH P.M. OP Apply 1 application to eye at bedtime. Apply to right eye    Myrbetriq 50 MG Tb24 tablet Generic drug:  mirabegron ER Take 50 mg by mouth daily.   polyethylene glycol packet Commonly known as:  MIRALAX / GLYCOLAX Take 17 g by mouth daily as needed for mild constipation or moderate constipation.   polyvinyl alcohol 1.4 % ophthalmic solution Commonly known as:  LIQUIFILM TEARS Place 1 drop into both eyes 4 (four) times daily.   psyllium 58.6 % packet Commonly known as:  METAMUCIL Take 1 packet by mouth every Monday, Wednesday, and Friday.   RA SALINE ENEMA RE Constipation (3 of 4): If not relieved by Biscodyl suppository, give disposable Saline Enema rectally X 1 dose/24 hrs as needed (Do not use constipation standing orders for residents with renal failure/CFR less than 30. Contact MD for orders)   senna-docusate 8.6-50 MG tablet Commonly known as:  Senokot-S Take 2 tablets by mouth  at bedtime.       Review of Systems   General she is not complaining of any fever or chills.  Weight appears to be relatively stable in the higher 80s  Skin does not complain of rashes or itching  Head ears eyes nose mouth and throat does not complain of visual changes or sore throat.  Respiratory is not complaining of being short of breath or having a cough.  Cardiac does not complain of chest pain or edema.  GI is not complaining of stomach pain nausea vomiting diarrhea or constipation.  GU has a history of overactive bladder but does not complain of dysuria today.  Musculoskeletal does not complain of joint pain currently does have some history of back pain.  Neurologic does not complain of dizziness headache numbness she does have a history of a cerebellar tremor.  Psych she is on clonazepam with anxiety as well as Lexapro for depression-this appears stable appears to be in good spirits Immunization History  Administered Date(s) Administered  . Influenza Split 08/11/2012  . Influenza,inj,Quad PF,6+ Mos 05/14/2013, 07/19/2014, 06/29/2015,  07/01/2016  . Influenza-Unspecified 06/09/2017, 07/07/2018  . PPD Test 01/23/2018  . Pneumococcal Polysaccharide-23 11/23/2011  . Tdap 01/23/2018   Pertinent  Health Maintenance Due  Topic Date Due  . PAP SMEAR-Modifier  12/17/2018 (Originally 12/02/1978)  . COLONOSCOPY  12/17/2018 (Originally 07/27/2014)  . MAMMOGRAM  04/13/2020  . INFLUENZA VACCINE  Completed   Fall Risk  10/13/2018 03/31/2018 01/22/2018 11/24/2017 06/29/2015  Falls in the past year? 1 Yes Yes Yes Yes  Number falls in past yr: 0 2 or more 1 2 or more 2 or more  Injury with Fall? 0 Yes Yes Yes Yes  Comment - - - - bruising   Risk Factor Category  - High Fall Risk - High Fall Risk High Fall Risk  Risk for fall due to : - - - - Impaired balance/gait;Impaired mobility;History of fall(s)  Follow up Falls evaluation completed - - Falls evaluation completed -   Functional Status Survey:    Vitals:   12/02/18 1031  BP: (!) 98/54  Pulse: 84  Resp: 18  Temp: (!) 97.4 F (36.3 C)  TempSrc: Oral  SpO2: 97%  Weight: 89 lb 12.8 oz (40.7 kg)  Height: 4\' 11"  (1.499 m)   Body mass index is 18.14 kg/m. Physical Exam   In general this is a very pleasant middle-age female in no distress.  Her skin is warm and dry.  Eyes visual acuity appears to be intact sclera and conjunctive are clear.  Oropharynx is clear mucous membranes moist.  Chest is clear to auscultation with slightly shallow air entry.  Heart is regular rate and rhythm without murmur gallop or rub she does not have significant lower extremity edema  Abdomen is soft nontender with positive bowel sounds.  Musculoskeletal has general frailty but is able to move her extremities x4 at baseline- continues to have generalized weakness.   Most Prominently of her left arm  .  Neurologic she does have an intermittent head tremor neurologic exam appears to be at baseline  Psych she is alert and oriented very pleasant and appropriate-  Labs reviewed:  Recent  Labs    12/12/17 01/20/18 11/09/18  NA 141 144 140  K 4.3 3.9 4.0  CL  --   --  105  CO2  --   --  26  BUN 18 20 27*  CREATININE 0.5 0.5 0.4*  CALCIUM  --   --  8.6   Recent Labs    12/12/17  AST 15  ALT 9  ALKPHOS 56   Recent Labs    12/12/17 09/11/18 11/09/18  WBC 6.4 5.4 5.7  NEUTROABS 4 3  --   HGB 12.5 11.6* 11.0*  HCT 39 35* 33*  PLT 185 170 190   Lab Results  Component Value Date   TSH 1.15 03/02/2015   Lab Results  Component Value Date   HGBA1C 5.5 10/11/2011   Lab Results  Component Value Date   CHOL 150 12/12/2017   HDL 38 12/12/2017   LDLCALC 101 12/12/2017   LDLDIRECT 165.0 05/14/2013   TRIG 54 12/12/2017   CHOLHDL 4 11/06/2015    Significant Diagnostic Results in last 30 days:  No results found.  Assessment/Plan  #1: History of cerebellar tremor-she is followed by neurology and is on clonazepam- appears to be fairly at baseline she does have neurology follow-up.  Appears to be doing well with supportive care.  2.  Anemia with occult stool hemoglobin has shown relative stability she does see GI and had a colonoscopy scheduled but this is been postponed because of the coronavirus issue-will update a CBC.  Clinically she appears to be at baseline.  3.  History of constipation this at one point was a significant issue but appears to be improved on the Linzess as well as senna and she has seen GI for this as well.  4-- history of hyperlipidemia LDL was somewhat elevated at 114 will increase her atorvastatin slightly to 20 mg a day and recheck a lipid panel and liver function tests in about 8 weeks  #5- history of osteoporosis she is on Prolia she receives this once every 6 months.  6.  History of anxiety with depression she is on clonazepam for anxiety this also is to help with her tremor- she is on Lexapro for depression this appears to be quite well controlled.  7.  History of overactive bladder she is on Myrbetriq ER daily she does not  complain of dysuria today recent urine culture appeared to be negative.  Also for screening will write an order to check on the GYN referral previously on orders for a mammogram to screen for breast cancer screening and a Pap smear to check for cervical cancer.  Also again will increase her atorvastatin slightly to 20 mg a day and update liver function tests lipid panel in about 8 weeks-also will update a CBC with differential next lab data keep an eye on her hemoglobin which has run around 11 recently  [id number]

## 2018-12-03 DIAGNOSIS — R270 Ataxia, unspecified: Secondary | ICD-10-CM | POA: Diagnosis not present

## 2018-12-03 DIAGNOSIS — R278 Other lack of coordination: Secondary | ICD-10-CM | POA: Diagnosis not present

## 2018-12-03 DIAGNOSIS — M6281 Muscle weakness (generalized): Secondary | ICD-10-CM | POA: Diagnosis not present

## 2018-12-03 DIAGNOSIS — Z85841 Personal history of malignant neoplasm of brain: Secondary | ICD-10-CM | POA: Diagnosis not present

## 2018-12-04 DIAGNOSIS — R278 Other lack of coordination: Secondary | ICD-10-CM | POA: Diagnosis not present

## 2018-12-04 DIAGNOSIS — D649 Anemia, unspecified: Secondary | ICD-10-CM | POA: Diagnosis not present

## 2018-12-04 DIAGNOSIS — I4891 Unspecified atrial fibrillation: Secondary | ICD-10-CM | POA: Diagnosis not present

## 2018-12-04 DIAGNOSIS — M6281 Muscle weakness (generalized): Secondary | ICD-10-CM | POA: Diagnosis not present

## 2018-12-04 DIAGNOSIS — Z85841 Personal history of malignant neoplasm of brain: Secondary | ICD-10-CM | POA: Diagnosis not present

## 2018-12-04 DIAGNOSIS — R270 Ataxia, unspecified: Secondary | ICD-10-CM | POA: Diagnosis not present

## 2018-12-04 LAB — CBC AND DIFFERENTIAL: Platelets: 167 (ref 150–399)

## 2018-12-07 ENCOUNTER — Other Ambulatory Visit: Payer: Self-pay

## 2018-12-07 DIAGNOSIS — R278 Other lack of coordination: Secondary | ICD-10-CM | POA: Diagnosis not present

## 2018-12-07 DIAGNOSIS — Z85841 Personal history of malignant neoplasm of brain: Secondary | ICD-10-CM | POA: Diagnosis not present

## 2018-12-07 DIAGNOSIS — R270 Ataxia, unspecified: Secondary | ICD-10-CM | POA: Diagnosis not present

## 2018-12-07 DIAGNOSIS — M6281 Muscle weakness (generalized): Secondary | ICD-10-CM | POA: Diagnosis not present

## 2018-12-07 MED ORDER — CLONAZEPAM 0.25 MG PO TBDP: 0.2500 mg | ORAL_TABLET | Freq: Two times a day (BID) | ORAL | 0 refills | Status: DC

## 2018-12-08 DIAGNOSIS — M6281 Muscle weakness (generalized): Secondary | ICD-10-CM | POA: Diagnosis not present

## 2018-12-08 DIAGNOSIS — Z85841 Personal history of malignant neoplasm of brain: Secondary | ICD-10-CM | POA: Diagnosis not present

## 2018-12-08 DIAGNOSIS — R270 Ataxia, unspecified: Secondary | ICD-10-CM | POA: Diagnosis not present

## 2018-12-08 DIAGNOSIS — R278 Other lack of coordination: Secondary | ICD-10-CM | POA: Diagnosis not present

## 2018-12-09 DIAGNOSIS — R278 Other lack of coordination: Secondary | ICD-10-CM | POA: Diagnosis not present

## 2018-12-09 DIAGNOSIS — Z85841 Personal history of malignant neoplasm of brain: Secondary | ICD-10-CM | POA: Diagnosis not present

## 2018-12-09 DIAGNOSIS — Z9181 History of falling: Secondary | ICD-10-CM | POA: Diagnosis not present

## 2018-12-09 DIAGNOSIS — R41841 Cognitive communication deficit: Secondary | ICD-10-CM | POA: Diagnosis not present

## 2018-12-09 DIAGNOSIS — M6281 Muscle weakness (generalized): Secondary | ICD-10-CM | POA: Diagnosis not present

## 2018-12-10 DIAGNOSIS — M6281 Muscle weakness (generalized): Secondary | ICD-10-CM | POA: Diagnosis not present

## 2018-12-10 DIAGNOSIS — Z85841 Personal history of malignant neoplasm of brain: Secondary | ICD-10-CM | POA: Diagnosis not present

## 2018-12-10 DIAGNOSIS — R41841 Cognitive communication deficit: Secondary | ICD-10-CM | POA: Diagnosis not present

## 2018-12-10 DIAGNOSIS — R278 Other lack of coordination: Secondary | ICD-10-CM | POA: Diagnosis not present

## 2018-12-10 DIAGNOSIS — Z9181 History of falling: Secondary | ICD-10-CM | POA: Diagnosis not present

## 2018-12-12 DIAGNOSIS — R278 Other lack of coordination: Secondary | ICD-10-CM | POA: Diagnosis not present

## 2018-12-12 DIAGNOSIS — R41841 Cognitive communication deficit: Secondary | ICD-10-CM | POA: Diagnosis not present

## 2018-12-12 DIAGNOSIS — Z9181 History of falling: Secondary | ICD-10-CM | POA: Diagnosis not present

## 2018-12-12 DIAGNOSIS — M6281 Muscle weakness (generalized): Secondary | ICD-10-CM | POA: Diagnosis not present

## 2018-12-12 DIAGNOSIS — Z85841 Personal history of malignant neoplasm of brain: Secondary | ICD-10-CM | POA: Diagnosis not present

## 2018-12-14 DIAGNOSIS — R41841 Cognitive communication deficit: Secondary | ICD-10-CM | POA: Diagnosis not present

## 2018-12-14 DIAGNOSIS — Z9181 History of falling: Secondary | ICD-10-CM | POA: Diagnosis not present

## 2018-12-14 DIAGNOSIS — M6281 Muscle weakness (generalized): Secondary | ICD-10-CM | POA: Diagnosis not present

## 2018-12-14 DIAGNOSIS — R278 Other lack of coordination: Secondary | ICD-10-CM | POA: Diagnosis not present

## 2018-12-14 DIAGNOSIS — Z85841 Personal history of malignant neoplasm of brain: Secondary | ICD-10-CM | POA: Diagnosis not present

## 2018-12-15 DIAGNOSIS — Z9181 History of falling: Secondary | ICD-10-CM | POA: Diagnosis not present

## 2018-12-15 DIAGNOSIS — Z85841 Personal history of malignant neoplasm of brain: Secondary | ICD-10-CM | POA: Diagnosis not present

## 2018-12-15 DIAGNOSIS — R278 Other lack of coordination: Secondary | ICD-10-CM | POA: Diagnosis not present

## 2018-12-15 DIAGNOSIS — R41841 Cognitive communication deficit: Secondary | ICD-10-CM | POA: Diagnosis not present

## 2018-12-15 DIAGNOSIS — M6281 Muscle weakness (generalized): Secondary | ICD-10-CM | POA: Diagnosis not present

## 2018-12-16 DIAGNOSIS — R41841 Cognitive communication deficit: Secondary | ICD-10-CM | POA: Diagnosis not present

## 2018-12-16 DIAGNOSIS — R278 Other lack of coordination: Secondary | ICD-10-CM | POA: Diagnosis not present

## 2018-12-16 DIAGNOSIS — M6281 Muscle weakness (generalized): Secondary | ICD-10-CM | POA: Diagnosis not present

## 2018-12-16 DIAGNOSIS — Z85841 Personal history of malignant neoplasm of brain: Secondary | ICD-10-CM | POA: Diagnosis not present

## 2018-12-16 DIAGNOSIS — Z9181 History of falling: Secondary | ICD-10-CM | POA: Diagnosis not present

## 2018-12-17 DIAGNOSIS — Z9181 History of falling: Secondary | ICD-10-CM | POA: Diagnosis not present

## 2018-12-17 DIAGNOSIS — R41841 Cognitive communication deficit: Secondary | ICD-10-CM | POA: Diagnosis not present

## 2018-12-17 DIAGNOSIS — Z85841 Personal history of malignant neoplasm of brain: Secondary | ICD-10-CM | POA: Diagnosis not present

## 2018-12-17 DIAGNOSIS — M6281 Muscle weakness (generalized): Secondary | ICD-10-CM | POA: Diagnosis not present

## 2018-12-17 DIAGNOSIS — R278 Other lack of coordination: Secondary | ICD-10-CM | POA: Diagnosis not present

## 2018-12-18 DIAGNOSIS — R278 Other lack of coordination: Secondary | ICD-10-CM | POA: Diagnosis not present

## 2018-12-18 DIAGNOSIS — Z85841 Personal history of malignant neoplasm of brain: Secondary | ICD-10-CM | POA: Diagnosis not present

## 2018-12-18 DIAGNOSIS — M6281 Muscle weakness (generalized): Secondary | ICD-10-CM | POA: Diagnosis not present

## 2018-12-18 DIAGNOSIS — Z9181 History of falling: Secondary | ICD-10-CM | POA: Diagnosis not present

## 2018-12-18 DIAGNOSIS — R41841 Cognitive communication deficit: Secondary | ICD-10-CM | POA: Diagnosis not present

## 2018-12-21 ENCOUNTER — Other Ambulatory Visit: Payer: Self-pay

## 2018-12-21 DIAGNOSIS — R41841 Cognitive communication deficit: Secondary | ICD-10-CM | POA: Diagnosis not present

## 2018-12-21 DIAGNOSIS — Z85841 Personal history of malignant neoplasm of brain: Secondary | ICD-10-CM | POA: Diagnosis not present

## 2018-12-21 DIAGNOSIS — M6281 Muscle weakness (generalized): Secondary | ICD-10-CM | POA: Diagnosis not present

## 2018-12-21 DIAGNOSIS — Z9181 History of falling: Secondary | ICD-10-CM | POA: Diagnosis not present

## 2018-12-21 DIAGNOSIS — R278 Other lack of coordination: Secondary | ICD-10-CM | POA: Diagnosis not present

## 2018-12-21 MED ORDER — CLONAZEPAM 0.25 MG PO TBDP: 0.2500 mg | ORAL_TABLET | Freq: Two times a day (BID) | ORAL | 0 refills | Status: DC

## 2018-12-22 ENCOUNTER — Other Ambulatory Visit: Payer: Self-pay | Admitting: Internal Medicine

## 2018-12-22 DIAGNOSIS — Z9181 History of falling: Secondary | ICD-10-CM | POA: Diagnosis not present

## 2018-12-22 DIAGNOSIS — R41841 Cognitive communication deficit: Secondary | ICD-10-CM | POA: Diagnosis not present

## 2018-12-22 DIAGNOSIS — Z85841 Personal history of malignant neoplasm of brain: Secondary | ICD-10-CM | POA: Diagnosis not present

## 2018-12-22 DIAGNOSIS — R278 Other lack of coordination: Secondary | ICD-10-CM | POA: Diagnosis not present

## 2018-12-22 DIAGNOSIS — M6281 Muscle weakness (generalized): Secondary | ICD-10-CM | POA: Diagnosis not present

## 2018-12-22 MED ORDER — CLONAZEPAM 0.25 MG PO TBDP: 0.2500 mg | ORAL_TABLET | Freq: Two times a day (BID) | ORAL | 0 refills | Status: DC

## 2018-12-23 DIAGNOSIS — R41841 Cognitive communication deficit: Secondary | ICD-10-CM | POA: Diagnosis not present

## 2018-12-23 DIAGNOSIS — Z9181 History of falling: Secondary | ICD-10-CM | POA: Diagnosis not present

## 2018-12-23 DIAGNOSIS — M6281 Muscle weakness (generalized): Secondary | ICD-10-CM | POA: Diagnosis not present

## 2018-12-23 DIAGNOSIS — Z85841 Personal history of malignant neoplasm of brain: Secondary | ICD-10-CM | POA: Diagnosis not present

## 2018-12-23 DIAGNOSIS — R278 Other lack of coordination: Secondary | ICD-10-CM | POA: Diagnosis not present

## 2018-12-24 ENCOUNTER — Encounter: Payer: Self-pay | Admitting: Internal Medicine

## 2018-12-24 ENCOUNTER — Non-Acute Institutional Stay (SKILLED_NURSING_FACILITY): Payer: Medicare Other | Admitting: Internal Medicine

## 2018-12-24 DIAGNOSIS — Z85841 Personal history of malignant neoplasm of brain: Secondary | ICD-10-CM | POA: Diagnosis not present

## 2018-12-24 DIAGNOSIS — R278 Other lack of coordination: Secondary | ICD-10-CM | POA: Diagnosis not present

## 2018-12-24 DIAGNOSIS — D649 Anemia, unspecified: Secondary | ICD-10-CM

## 2018-12-24 DIAGNOSIS — M6281 Muscle weakness (generalized): Secondary | ICD-10-CM | POA: Diagnosis not present

## 2018-12-24 DIAGNOSIS — C719 Malignant neoplasm of brain, unspecified: Secondary | ICD-10-CM | POA: Diagnosis not present

## 2018-12-24 DIAGNOSIS — Z9181 History of falling: Secondary | ICD-10-CM | POA: Diagnosis not present

## 2018-12-24 DIAGNOSIS — R41841 Cognitive communication deficit: Secondary | ICD-10-CM | POA: Diagnosis not present

## 2018-12-24 DIAGNOSIS — R29818 Other symptoms and signs involving the nervous system: Secondary | ICD-10-CM | POA: Diagnosis not present

## 2018-12-24 DIAGNOSIS — R4189 Other symptoms and signs involving cognitive functions and awareness: Secondary | ICD-10-CM

## 2018-12-24 NOTE — Assessment & Plan Note (Addendum)
No significant or worrisome GI symptoms voiced, and staff reports no stool changes.  Bleeding dyscrasias denied. Most recent hemoglobin/hematocrit revealed improvement from the 09/11/2018 values of 11.6/35 to values of 12/36 on 3/25 Repeat  CBC will be deferred to resolution of the corona virus epidemic

## 2018-12-24 NOTE — Patient Instructions (Signed)
See assessment and plan under each diagnosis in the problem list and acutely for this visit 

## 2018-12-24 NOTE — Assessment & Plan Note (Addendum)
Although difficult to to assess, the cerebellar tremors appear to be somewhat worse than on prior exams. She voices no concerns.  She will continue to be observed and neurology follow-up rescheduled earlier than planned August 2020 visit if symptoms and signs seem to be progressive.

## 2018-12-24 NOTE — Progress Notes (Signed)
NURSING HOME LOCATION:  [facility] ROOM NUMBER:  110/A  CODE STATUS: Full Code   PCP:  Hendricks Limes MD.  This is a nursing facility follow up of chronic medical diagnoses  Interim medical record and care since last [facility] visit was updated with review of diagnostic studies and change in clinical status since last visit were documented.  HPI: In December 2019 2 of 3 fecal occult blood studies were positive.  Serial CBCs revealed a progressive  normochromic normocytic anemia with hemoglobin and hematocrit of 11/33 as of 11/09/2018.  On 1/3 the values had been 11.6/35.  The most recent CBC on 12/02/2018 revealed normal values of 12/36.   Dr. Henrene Pastor has canceled her colonoscopy until the coronavirus epidemic has resolved.  Serial mental status testing has shown some variability in results.  Initial testing 12/09/2017 revealed mild neurocognitive deficit with a value of 22 out of 30.  Repeat testing approximately a month later on 5/1 revealed a score of 28 out of 30 suggesting no neurocognitive deficit.  On 09/01/2018 the value again was 22.  The most recent testing  12/21/2018 revealed a score of 19 out of 30 compatible with dementia.  It is significant that the patient's room & total ADL pattern have been changed recently in the context of the corona virus epidemic.  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  MRI 08/27/2018 showed the post radiation changes as well as severe atrophy of the cerebellum and brainstem.  Ballooning of the fourth ventricle was felt to be stable compared to an MRI performed in 2018.  Patient had undergone a VP shunt and there was no evidence of hydrocephalus.  The situation is complicated by ataxia and cerebellar tremor.  Neurology follow-up is scheduled on a regular basis.  She last saw Dr. Tomi Likens 10/13/2018; clonazepam 0.25 mg was prescribed twice daily.  She was  to be reevaluated in August of this year.  Review of systems: Date given as December 23, 2018.  She was able to identify the coronavirus as the indication for the wearing of masks in the facility.Her main concern was "when can I return to my (old) room?"  She denies any bleeding dyscrasias.  She has no GI symptoms except that her stomach "rolls".   She describes "respiratory" as somewhat of a hiccuping pattern to her respirations.  She denies shortness of breath, wheezing, or productive cough.  Constitutional: No fever, significant weight change, fatigue  Eyes: No redness, discharge, pain, vision change ENT/mouth: No nasal congestion,  purulent discharge, earache, change in hearing, sore throat  Cardiovascular: No chest pain, palpitations, paroxysmal nocturnal dyspnea, claudication, edema  Respiratory: No cough, sputum production, hemoptysis, DOE, significant snoring, apnea   Gastrointestinal: No heartburn, dysphagia, abdominal pain, nausea /vomiting, rectal bleeding, melena, change in bowels Genitourinary: No dysuria, hematuria, pyuria, incontinence, nocturia Musculoskeletal: No joint stiffness, joint swelling, weakness, pain Dermatologic: No rash, pruritus, change in appearance of skin Neurologic: No headache, syncope, seizures Psychiatric: No significant depression, insomnia, anorexia Endocrine: No change in hair/skin/nails, excessive thirst, excessive hunger, excessive urination  Hematologic/lymphatic: No significant bruising, lymphadenopathy, abnormal bleeding Allergy/immunology: No itchy/watery eyes, significant sneezing, urticaria, angioedema  Physical exam:  Pertinent or positive findings: She sits in the wheelchair exhibiting a rolling tremor of her trunk mimmicing a planet in orbit.  This and tremor of the upper extremities is accentuated with muscular activity.  She also exhibits similar findings  in the lower extremities to a lesser extent.  There is markedly decreased control over  voluntary activity of the upper and lower extremities with jerking and "past-pointing".  She has intermittent esotropia and squinting of the left eye.  Her pedal pulses are decreased.  She does have significant atrophy of the limbs but strength to opposition is surprisingly good in all extremities.  General appearance: Thin but adequately nourished; no acute distress, increased work of breathing is present.   Lymphatic: No lymphadenopathy about the head, neck, axilla. Eyes: No conjunctival inflammation or lid edema is present. There is no scleral icterus. Ears:  External ear exam shows no significant lesions or deformities.   Nose:  External nasal examination shows no deformity or inflammation. Nasal mucosa are pink and moist without lesions, exudates Oral exam:  Lips and gums are healthy appearing. There is no oropharyngeal erythema or exudate. Neck:  No thyromegaly, masses, tenderness noted.    Heart:  Normal rate and regular rhythm. S1 and S2 normal without gallop, murmur, click, rub .  Lungs: Chest clear to auscultation without wheezes, rhonchi, rales, rubs. Abdomen: Bowel sounds are normal. Abdomen is soft and nontender with no organomegaly, hernias, masses. GU: Deferred  Extremities:  No cyanosis, clubbing, edema  Neurologic exam : Balance, Rhomberg, finger to nose testing could not be completed due to clinical state Skin: Warm & dry w/o tenting. No significant lesions or rash.  See summary under each active problem in the Problem List with associated updated therapeutic plan

## 2018-12-24 NOTE — Assessment & Plan Note (Addendum)
SLUMS results will be provided to her Neurologist. Variability may be related to environmental & room changes related to [employer].

## 2018-12-25 ENCOUNTER — Encounter: Payer: Self-pay | Admitting: Internal Medicine

## 2018-12-25 DIAGNOSIS — M6281 Muscle weakness (generalized): Secondary | ICD-10-CM | POA: Diagnosis not present

## 2018-12-25 DIAGNOSIS — R41841 Cognitive communication deficit: Secondary | ICD-10-CM | POA: Diagnosis not present

## 2018-12-25 DIAGNOSIS — Z85841 Personal history of malignant neoplasm of brain: Secondary | ICD-10-CM | POA: Diagnosis not present

## 2018-12-25 DIAGNOSIS — R278 Other lack of coordination: Secondary | ICD-10-CM | POA: Diagnosis not present

## 2018-12-25 DIAGNOSIS — Z9181 History of falling: Secondary | ICD-10-CM | POA: Diagnosis not present

## 2018-12-28 DIAGNOSIS — M6281 Muscle weakness (generalized): Secondary | ICD-10-CM | POA: Diagnosis not present

## 2018-12-28 DIAGNOSIS — Z85841 Personal history of malignant neoplasm of brain: Secondary | ICD-10-CM | POA: Diagnosis not present

## 2018-12-28 DIAGNOSIS — R278 Other lack of coordination: Secondary | ICD-10-CM | POA: Diagnosis not present

## 2018-12-28 DIAGNOSIS — Z9181 History of falling: Secondary | ICD-10-CM | POA: Diagnosis not present

## 2018-12-28 DIAGNOSIS — R41841 Cognitive communication deficit: Secondary | ICD-10-CM | POA: Diagnosis not present

## 2018-12-29 DIAGNOSIS — R278 Other lack of coordination: Secondary | ICD-10-CM | POA: Diagnosis not present

## 2018-12-29 DIAGNOSIS — Z85841 Personal history of malignant neoplasm of brain: Secondary | ICD-10-CM | POA: Diagnosis not present

## 2018-12-29 DIAGNOSIS — M6281 Muscle weakness (generalized): Secondary | ICD-10-CM | POA: Diagnosis not present

## 2018-12-29 DIAGNOSIS — R41841 Cognitive communication deficit: Secondary | ICD-10-CM | POA: Diagnosis not present

## 2018-12-29 DIAGNOSIS — Z9181 History of falling: Secondary | ICD-10-CM | POA: Diagnosis not present

## 2018-12-30 DIAGNOSIS — M6281 Muscle weakness (generalized): Secondary | ICD-10-CM | POA: Diagnosis not present

## 2018-12-30 DIAGNOSIS — Z85841 Personal history of malignant neoplasm of brain: Secondary | ICD-10-CM | POA: Diagnosis not present

## 2018-12-30 DIAGNOSIS — R41841 Cognitive communication deficit: Secondary | ICD-10-CM | POA: Diagnosis not present

## 2018-12-30 DIAGNOSIS — Z9181 History of falling: Secondary | ICD-10-CM | POA: Diagnosis not present

## 2018-12-30 DIAGNOSIS — R278 Other lack of coordination: Secondary | ICD-10-CM | POA: Diagnosis not present

## 2018-12-31 DIAGNOSIS — M6281 Muscle weakness (generalized): Secondary | ICD-10-CM | POA: Diagnosis not present

## 2018-12-31 DIAGNOSIS — Z9181 History of falling: Secondary | ICD-10-CM | POA: Diagnosis not present

## 2018-12-31 DIAGNOSIS — R278 Other lack of coordination: Secondary | ICD-10-CM | POA: Diagnosis not present

## 2018-12-31 DIAGNOSIS — Z85841 Personal history of malignant neoplasm of brain: Secondary | ICD-10-CM | POA: Diagnosis not present

## 2018-12-31 DIAGNOSIS — R41841 Cognitive communication deficit: Secondary | ICD-10-CM | POA: Diagnosis not present

## 2019-01-01 ENCOUNTER — Encounter: Payer: Self-pay | Admitting: Internal Medicine

## 2019-01-01 ENCOUNTER — Non-Acute Institutional Stay (SKILLED_NURSING_FACILITY): Payer: Medicare Other | Admitting: Internal Medicine

## 2019-01-01 DIAGNOSIS — M6281 Muscle weakness (generalized): Secondary | ICD-10-CM | POA: Diagnosis not present

## 2019-01-01 DIAGNOSIS — I959 Hypotension, unspecified: Secondary | ICD-10-CM | POA: Diagnosis not present

## 2019-01-01 DIAGNOSIS — R21 Rash and other nonspecific skin eruption: Secondary | ICD-10-CM

## 2019-01-01 DIAGNOSIS — F419 Anxiety disorder, unspecified: Secondary | ICD-10-CM | POA: Diagnosis not present

## 2019-01-01 DIAGNOSIS — F3341 Major depressive disorder, recurrent, in partial remission: Secondary | ICD-10-CM | POA: Diagnosis not present

## 2019-01-01 DIAGNOSIS — R300 Dysuria: Secondary | ICD-10-CM

## 2019-01-01 DIAGNOSIS — Z85841 Personal history of malignant neoplasm of brain: Secondary | ICD-10-CM | POA: Diagnosis not present

## 2019-01-01 DIAGNOSIS — Z9181 History of falling: Secondary | ICD-10-CM | POA: Diagnosis not present

## 2019-01-01 DIAGNOSIS — R278 Other lack of coordination: Secondary | ICD-10-CM | POA: Diagnosis not present

## 2019-01-01 DIAGNOSIS — R41841 Cognitive communication deficit: Secondary | ICD-10-CM | POA: Diagnosis not present

## 2019-01-01 NOTE — Progress Notes (Signed)
Location:    [facility] Room Number: 110A Place of Service:  SNF [id number]) Provider:  Loraine Maple, MD  Patient Care Team: Hendricks Limes, MD as PCP - General (Internal Medicine) Larey Dresser, MD as Referring Physician (Cardiology) Larey Dresser, MD (Cardiology) Medina-Vargas, Senaida Lange, NP as Nurse Practitioner (Internal Medicine) Pieter Partridge, DO as Consulting Physician (Neurology)  Extended Emergency Contact Information Primary Emergency Contact: [name], [place] [address] [place] Phone: [phone] Relation: Sister  Code Status:  CPR Goals of care: Advanced Directive information Advanced Directives 12/24/2018  Does Patient Have a Medical Advance Directive? Yes  Type of Advance Directive (No Data)  Does patient want to make changes to medical advance directive? No - Patient declined  Would patient like information on creating a medical advance directive? -  Pre-existing out of facility DNR order (yellow form or pink MOST form) -     Chief Complaint  Patient presents with  . Acute Visit    perineal burning    HPI:  [name] seen today for an acute visit for complaints of perineal burning  Patient is a long-term resident of facility with a history of cerebellar tremors- secondary to prior cerebellar astrocytoma treatment.  She also has a history of hyperlipidemia occult positive stool hemoglobin has been stable as well as overactive bladder osteoporosis and depression.  She continues on Myrbetriq.  Apparently she is complaining of some increased burning she describes this is more intermittent with urination but also has some perineal discomfort.  Is not complain of any fever chills or increased back pain.  Has been afebrile   Past Medical History:  Diagnosis Date  . Astrocytoma brain tumor ([facility]) 1971   astrocytoma treated with radiation  . C. difficile colitis 04/6760    complicated by sepsis/ARDS and prolonged hosp  . Depression   . Hyperlipidemia   . Mitral regurgitation 10/2011   severe, s/p min invasive repair and annuloplasty  . Mitral valve prolapse   . Neuromuscular disorder ([facility])    numbness in hands and feet  . Osteoarthritis   . Osteoporosis    DEXA @ [facility] 11/2012: -3.5, prior tx Reclast, last in 2011, rec change to Prolia  . Overactive bladder   . Pleural effusion, left 11/11/2011  . Vitamin D deficiency    Past Surgical History:  Procedure Laterality Date  . CARDIAC CATHETERIZATION     Jan 2013  . KNEE ARTHROSCOPY Right   . MITRAL VALVE REPAIR  10/15/2011   Procedure: MINIMALLY INVASIVE MITRAL VALVE REPAIR (MVR);  Surgeon: Rexene Alberts, MD;  Location: [facility];  Service: Open Heart Surgery;  Laterality: Right;  . TRANSESOPHAGEAL ECHOCARDIOGRAM  7/12  . US ECHOCARDIOGRAPHY  6/12  . VENTRICULOPERITONEAL SHUNT  1971    Allergies  Allergen Reactions  . Compazine Other (See Comments)    "eyes get buggy"    Outpatient Encounter Medications as of 01/01/2019  Medication Sig  . atorvastatin (LIPITOR) 20 MG tablet Take 20 mg by mouth daily.  . bisacodyl (DULCOLAX) 10 MG suppository Constipation (2 of 4): If not relieved by MOM, give 10 mg Bisacodyl suppositiory rectally X 1 dose in 24 hours as needed (Do not use constipation standing orders for residents with renal failure/CFR less than 30. Contact MD for orders)  . cholecalciferol (VITAMIN D) 1000 units tablet Take 1,000 Units by mouth  daily.  . clonazePAM (KLONOPIN) 0.25 MG disintegrating tablet Take 1 tablet (0.25 mg total) by mouth 2 (two) times daily for 14 days.  Marland Kitchen denosumab (PROLIA) 60 MG/ML SOLN injection Inject 60 mg into the skin every 6 (six) months. Administer in upper arm, thigh, or abdomen  . escitalopram (LEXAPRO) 10 MG tablet Take 1 tablet (10 mg total) by mouth daily.  Marland Kitchen linaclotide (LINZESS) 145 MCG CAPS capsule TAKE ONE CAPSULE BY MOUTH DAILY BEFORE BREAKFAST  . loratadine  (CLARITIN) 10 MG tablet Take 10 mg by mouth daily.  . Menthol, Topical Analgesic, (BIOFREEZE) 4 % GEL Apply 1 application topically 2 (two) times daily. Apply topically BID to sore areas - back  . mirabegron ER (MYRBETRIQ) 50 MG TB24 tablet Take 50 mg by mouth daily.  . polyethylene glycol (MIRALAX / GLYCOLAX) packet Take 17 g by mouth daily as needed for mild constipation or moderate constipation.  . polyvinyl alcohol (LIQUIFILM TEARS) 1.4 % ophthalmic solution Place 1 drop into both eyes 4 (four) times daily.   . psyllium (METAMUCIL) 58.6 % packet Take 1 packet by mouth every Monday, Wednesday, and Friday.  . senna-docusate (SENOKOT-S) 8.6-50 MG tablet Take 2 tablets by mouth at bedtime.  . Sodium Phosphates (RA SALINE ENEMA RE) Constipation (3 of 4): If not relieved by Biscodyl suppository, give disposable Saline Enema rectally X 1 dose/24 hrs as needed (Do not use constipation standing orders for residents with renal failure/CFR less than 30. Contact MD for orders)  . White Petrolatum-Mineral Oil (LUBRIFRESH P.M. OP) Apply 1 application to eye at bedtime. Apply to right eye  . [DISCONTINUED] atorvastatin (LIPITOR) 10 MG tablet TAKE ONE TABLET BY MOUTH DAILY   No facility-administered encounter medications on file as of 01/01/2019.     Review of Systems   General she is not complaining of any fever or chills.  Skin does complain of burning in her buttocks and perineal area is not really complaining of rashes or diaphoresis.  Head ears eyes nose mouth and throat is not complaining of visual changes or sore throat  Respiratory is not complaining of having a cough or being short of breath.  Cardiac does not complain of chest pain or edema.  GI is not complaining of abdominal discomfort any nausea vomiting diarrhea or constipation at this time does have some history of constipation  GU does complain of intermittent burning and some itching in her perineal area  Musculoskeletal is not  complaining of joint pain or back pain today  Neurologic does not complain of dizziness headache numbness has a history of a cerebellar tremor.  Psych does not complain being depressed he does have a history of anxiety this appears well controlled on routine clonazepam  Immunization History  Administered Date(s) Administered  . Influenza Split 08/11/2012  . Influenza,inj,Quad PF,6+ Mos 05/14/2013, 07/19/2014, 06/29/2015, 07/01/2016  . Influenza-Unspecified 06/09/2017, 07/07/2018  . PPD Test 01/23/2018  . Pneumococcal Polysaccharide-23 11/23/2011  . Tdap 01/23/2018   Pertinent  Health Maintenance Due  Topic Date Due  . PAP SMEAR-Modifier  01/23/2019 (Originally 12/02/1978)  . COLONOSCOPY  01/23/2019 (Originally 07/27/2014)  . INFLUENZA VACCINE  04/10/2019  . MAMMOGRAM  04/13/2020   Fall Risk  10/13/2018 03/31/2018 01/22/2018 11/24/2017 06/29/2015  Falls in the past year? 1 Yes Yes Yes Yes  Number falls in past yr: 0 2 or more 1 2 or more 2 or more  Injury with Fall? 0 Yes Yes Yes Yes  Comment - - - - bruising  Risk Factor Category  - High Fall Risk - High Fall Risk High Fall Risk  Risk for fall due to : - - - - Impaired balance/gait;Impaired mobility;History of fall(s)  Follow up Falls evaluation completed - - Falls evaluation completed -   Functional Status Survey:    Vitals:   01/01/19 1020  BP: (!) 99/58  Pulse: 88  Resp: 17  Temp: 98.4 F (36.9 C)  Weight: 89 lb 12.8 oz (40.7 kg)  Height: 4\' 11"  (1.499 m)   Body mass index is 18.14 kg/m. Physical Exam   In general this is a pleasant middle-age female in no distress  Skin is warm and dry for perineal area please see GU  Eyes visual acuity appears to be intact sclera and conjunctive are clear  Oropharynx is clear mucous membranes moist   Chest is clear to auscultation with shallow air entry there is no labored breathing   Heart is regular rate and rhythm without murmur gallop or rub she does not have  significant lower extremity edema   Abdomen is soft nontender with active bowel sounds  GU could not really appreciate suprapubic tenderness did not note any drainage there is a pale erythematous rash perineal area.  I did not note any vaginal discharge.  Musculoskeletal has general frailty with muscle atrophy but moves all extremities x4 at baseline.  Neurologic continues with a tremor of her trunk as well as her extremities most prominently upper extremities this appears to be fairly baseline.  Psych she is alert and oriented pleasant and appropriate  Labs reviewed: Recent Labs    01/20/18 11/09/18  NA 144 140  K 3.9 4.0  CL  --  105  CO2  --  26  BUN 20 27*  CREATININE 0.5 0.4*  CALCIUM  --  8.6   No results for input(s): AST, ALT, ALKPHOS, BILITOT, PROT, ALBUMIN in the last 8760 hours. Recent Labs    09/11/18 11/09/18 12/02/18 12/04/18  WBC 5.4 5.7 4.8  --   NEUTROABS 3  --   --   --   HGB 11.6* 11.0* 12.0  --   HCT 35* 33* 36  --   PLT 170 190  --  167   Lab Results  Component Value Date   TSH 1.15 03/02/2015   Lab Results  Component Value Date   HGBA1C 5.5 10/11/2011   Lab Results  Component Value Date   CHOL 150 12/12/2017   HDL 38 12/12/2017   LDLCALC 101 12/12/2017   LDLDIRECT 165.0 05/14/2013   TRIG 54 12/12/2017   CHOLHDL 4 11/06/2015    Significant Diagnostic Results in last 30 days:  No results found.  Assessment/Plan  #1 history of perineal rash with burning will prescribe Nizoral cream apply twice daily and monitor this was discussed with the wound care nurse as well.  Also will obtain a urinalysis and culture secondary to burning with urination-she describes this as intermittent- I did not note any discharge on exam from the vaginal area but this will have to be watched  She does continue on Myrbetriq for overactive bladder--most  recent urine culture apparently appear to be negative but secondary to some complaints today of dysuria  burning with urination will obtain an updated one  2 hypotension?-She frequently has systolics in the 62Z but is asymptomatic-she frequently has readings in the 90s to lower 100s but is asymptomatic--she does not complain of any dizziness or syncope or increased weakness from baseline-we will continue to [facility]  is not on any blood pressure medicine  [id number]--of note greater than 25 minutes spent assessing patient-discussing her status with nursing staff reviewing her chart and labs- coordinating a plan of care Greater than 50% of time spent coordinating a plan of care with input as noted above  [place]ville Lewis, [place]

## 2019-01-04 ENCOUNTER — Other Ambulatory Visit: Payer: Self-pay | Admitting: Internal Medicine

## 2019-01-04 DIAGNOSIS — R278 Other lack of coordination: Secondary | ICD-10-CM | POA: Diagnosis not present

## 2019-01-04 DIAGNOSIS — R41841 Cognitive communication deficit: Secondary | ICD-10-CM | POA: Diagnosis not present

## 2019-01-04 DIAGNOSIS — M6281 Muscle weakness (generalized): Secondary | ICD-10-CM | POA: Diagnosis not present

## 2019-01-04 DIAGNOSIS — Z9181 History of falling: Secondary | ICD-10-CM | POA: Diagnosis not present

## 2019-01-04 DIAGNOSIS — Z85841 Personal history of malignant neoplasm of brain: Secondary | ICD-10-CM | POA: Diagnosis not present

## 2019-01-04 MED ORDER — CLONAZEPAM 0.25 MG PO TBDP: 0.2500 mg | ORAL_TABLET | Freq: Two times a day (BID) | ORAL | 0 refills | Status: DC

## 2019-01-05 ENCOUNTER — Other Ambulatory Visit: Payer: Self-pay | Admitting: Internal Medicine

## 2019-01-05 DIAGNOSIS — Z9181 History of falling: Secondary | ICD-10-CM | POA: Diagnosis not present

## 2019-01-05 DIAGNOSIS — M6281 Muscle weakness (generalized): Secondary | ICD-10-CM | POA: Diagnosis not present

## 2019-01-05 DIAGNOSIS — R41841 Cognitive communication deficit: Secondary | ICD-10-CM | POA: Diagnosis not present

## 2019-01-05 DIAGNOSIS — Z85841 Personal history of malignant neoplasm of brain: Secondary | ICD-10-CM | POA: Diagnosis not present

## 2019-01-05 DIAGNOSIS — R278 Other lack of coordination: Secondary | ICD-10-CM | POA: Diagnosis not present

## 2019-01-05 MED ORDER — CLONAZEPAM 0.25 MG PO TBDP: 0.2500 mg | ORAL_TABLET | Freq: Two times a day (BID) | ORAL | 0 refills | Status: DC

## 2019-01-06 DIAGNOSIS — Z85841 Personal history of malignant neoplasm of brain: Secondary | ICD-10-CM | POA: Diagnosis not present

## 2019-01-06 DIAGNOSIS — Z9181 History of falling: Secondary | ICD-10-CM | POA: Diagnosis not present

## 2019-01-06 DIAGNOSIS — R278 Other lack of coordination: Secondary | ICD-10-CM | POA: Diagnosis not present

## 2019-01-06 DIAGNOSIS — R41841 Cognitive communication deficit: Secondary | ICD-10-CM | POA: Diagnosis not present

## 2019-01-06 DIAGNOSIS — M6281 Muscle weakness (generalized): Secondary | ICD-10-CM | POA: Diagnosis not present

## 2019-01-07 DIAGNOSIS — R278 Other lack of coordination: Secondary | ICD-10-CM | POA: Diagnosis not present

## 2019-01-07 DIAGNOSIS — Z85841 Personal history of malignant neoplasm of brain: Secondary | ICD-10-CM | POA: Diagnosis not present

## 2019-01-07 DIAGNOSIS — M6281 Muscle weakness (generalized): Secondary | ICD-10-CM | POA: Diagnosis not present

## 2019-01-07 DIAGNOSIS — Z9181 History of falling: Secondary | ICD-10-CM | POA: Diagnosis not present

## 2019-01-07 DIAGNOSIS — R41841 Cognitive communication deficit: Secondary | ICD-10-CM | POA: Diagnosis not present

## 2019-01-08 DIAGNOSIS — R278 Other lack of coordination: Secondary | ICD-10-CM | POA: Diagnosis not present

## 2019-01-08 DIAGNOSIS — R41841 Cognitive communication deficit: Secondary | ICD-10-CM | POA: Diagnosis not present

## 2019-01-08 DIAGNOSIS — M6281 Muscle weakness (generalized): Secondary | ICD-10-CM | POA: Diagnosis not present

## 2019-01-08 DIAGNOSIS — Z85841 Personal history of malignant neoplasm of brain: Secondary | ICD-10-CM | POA: Diagnosis not present

## 2019-01-08 DIAGNOSIS — Z9181 History of falling: Secondary | ICD-10-CM | POA: Diagnosis not present

## 2019-01-11 ENCOUNTER — Non-Acute Institutional Stay (SKILLED_NURSING_FACILITY): Payer: Medicare Other | Admitting: Internal Medicine

## 2019-01-11 ENCOUNTER — Encounter: Payer: Self-pay | Admitting: Internal Medicine

## 2019-01-11 DIAGNOSIS — I959 Hypotension, unspecified: Secondary | ICD-10-CM | POA: Diagnosis not present

## 2019-01-11 DIAGNOSIS — R29898 Other symptoms and signs involving the musculoskeletal system: Secondary | ICD-10-CM | POA: Diagnosis not present

## 2019-01-11 DIAGNOSIS — R278 Other lack of coordination: Secondary | ICD-10-CM | POA: Diagnosis not present

## 2019-01-11 DIAGNOSIS — G709 Myoneural disorder, unspecified: Secondary | ICD-10-CM | POA: Diagnosis not present

## 2019-01-11 DIAGNOSIS — Z85841 Personal history of malignant neoplasm of brain: Secondary | ICD-10-CM | POA: Diagnosis not present

## 2019-01-11 DIAGNOSIS — R41841 Cognitive communication deficit: Secondary | ICD-10-CM | POA: Diagnosis not present

## 2019-01-11 DIAGNOSIS — Z9181 History of falling: Secondary | ICD-10-CM | POA: Diagnosis not present

## 2019-01-11 DIAGNOSIS — M6281 Muscle weakness (generalized): Secondary | ICD-10-CM | POA: Diagnosis not present

## 2019-01-11 NOTE — Progress Notes (Signed)
Location:    [facility] Room Number: 110-A Place of Service:  SNF (31) Provider:  Granville Lewis, PA-C  Hendricks Limes, MD  Patient Care Team: Hendricks Limes, MD as PCP - General (Internal Medicine) Larey Dresser, MD as Referring Physician (Cardiology) Larey Dresser, MD (Cardiology) Medina-Vargas, Senaida Lange, NP as Nurse Practitioner (Internal Medicine) Pieter Partridge, DO as Consulting Physician (Neurology)  Extended Emergency Contact Information Primary Emergency Contact: [name], [place] [address] [place] Phone: [phone] Relation: Sister  Code Status:  Full Code Goals of care: Advanced Directive information Advanced Directives 12/24/2018  Does Patient Have a Medical Advance Directive? Yes  Type of Advance Directive (No Data)  Does patient want to make changes to medical advance directive? No - Patient declined  Would patient like information on creating a medical advance directive? -  Pre-existing out of facility DNR order (yellow form or pink MOST form) -     Chief Complaint  Patient presents with  . Acute Visit    Jaw issues    HPI:  [name] seen today for an acute visit for complaints of the right side of her jaw having some creaking this morning.  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  She also has a history of  Hyperlipidemia constipation occult positive stool osteoporosis overactive bladder and depression  She was recently treated for a rash in her groin area and according to her this is resolving does not complain of any dysuria at this time.  Her only complaint is that this morning at times when she would move her jaw she heard a cracking sound this was not associated with any pain just felt kind of weird.  Currently she says this is better she was able to eat her breakfast without difficulty- she does not complain  of any increased difficulty or discomfort when moving her jaw she does not complain of any dental pain  Otherwise she has no complaints today.  She does at times run somewhat low blood pressures with systolic in the 95M which appears to be the case today actually see a listed reading over the weekend at 79/48 but her norm appears to be more in the 90s to low 841L systolically it is in the low 90s today.  She is not complaining of any syncope dizziness headache chest pain visual changes she is actually is reading some paperwork today in her room.     Past Medical History:  Diagnosis Date  . Astrocytoma brain tumor ([facility]) 1971   astrocytoma treated with radiation  . C. difficile colitis 10/4399   complicated by sepsis/ARDS and prolonged hosp  . Depression   . Hyperlipidemia   . Mitral regurgitation 10/2011   severe, s/p min invasive repair and annuloplasty  . Mitral valve prolapse   . Neuromuscular disorder ([facility])    numbness in hands and feet  . Osteoarthritis   . Osteoporosis    DEXA @ [facility] 11/2012: -3.5, prior tx Reclast, last in 2011, rec change to Prolia  . Overactive bladder   . Pleural effusion, left 11/11/2011  . Vitamin D deficiency    Past Surgical History:  Procedure Laterality Date  . CARDIAC CATHETERIZATION     Jan 2013  . KNEE ARTHROSCOPY Right   . MITRAL VALVE REPAIR  10/15/2011   Procedure: MINIMALLY INVASIVE MITRAL  VALVE REPAIR (MVR);  Surgeon: Rexene Alberts, MD;  Location: [facility];  Service: Open Heart Surgery;  Laterality: Right;  . TRANSESOPHAGEAL ECHOCARDIOGRAM  7/12  . US ECHOCARDIOGRAPHY  6/12  . VENTRICULOPERITONEAL SHUNT  1971    Allergies  Allergen Reactions  . Compazine Other (See Comments)    "eyes get buggy"    Outpatient Encounter Medications as of 01/11/2019  Medication Sig  . atorvastatin (LIPITOR) 20 MG tablet Take 20 mg by mouth daily.   . bisacodyl (DULCOLAX) 10 MG suppository Constipation (2 of 4): If not relieved by MOM, give 10 mg Bisacodyl  suppositiory rectally X 1 dose in 24 hours as needed (Do not use constipation standing orders for residents with renal failure/CFR less than 30. Contact MD for orders)  . cholecalciferol (VITAMIN D) 1000 units tablet Take 1,000 Units by mouth daily.  . clonazePAM (KLONOPIN) 0.25 MG disintegrating tablet Take 1 tablet (0.25 mg total) by mouth 2 (two) times daily for 60 doses.  Marland Kitchen denosumab (PROLIA) 60 MG/ML SOLN injection Inject 60 mg into the skin every 6 (six) months. Administer in upper arm, thigh, or abdomen  . escitalopram (LEXAPRO) 10 MG tablet Take 1 tablet (10 mg total) by mouth daily.  Marland Kitchen ketoconazole (NIZORAL) 2 % cream Apply 1 application topically 2 (two) times daily. Apply to perineal rash until resolved  . linaclotide (LINZESS) 145 MCG CAPS capsule TAKE ONE CAPSULE BY MOUTH DAILY BEFORE BREAKFAST  . loratadine (CLARITIN) 10 MG tablet Take 10 mg by mouth daily.  . Menthol, Topical Analgesic, (BIOFREEZE) 4 % GEL Apply 1 application topically 2 (two) times daily. Apply topically BID to sore areas - back  . mirabegron ER (MYRBETRIQ) 50 MG TB24 tablet Take 50 mg by mouth daily.  . polyethylene glycol (MIRALAX / GLYCOLAX) packet Take 17 g by mouth daily as needed for mild constipation or moderate constipation.  . polyvinyl alcohol (LIQUIFILM TEARS) 1.4 % ophthalmic solution Place 1 drop into both eyes 4 (four) times daily.   . psyllium (METAMUCIL) 58.6 % packet Take 1 packet by mouth every Monday, Wednesday, and Friday.  . senna-docusate (SENOKOT-S) 8.6-50 MG tablet Take 2 tablets by mouth at bedtime.  . Sodium Phosphates (RA SALINE ENEMA RE) Constipation (3 of 4): If not relieved by Biscodyl suppository, give disposable Saline Enema rectally X 1 dose/24 hrs as needed (Do not use constipation standing orders for residents with renal failure/CFR less than 30. Contact MD for orders)  . White Petrolatum-Mineral Oil (LUBRIFRESH P.M. OP) Apply 1 application to eye at bedtime. Apply to right eye   No  facility-administered encounter medications on file as of 01/11/2019.     Review of Systems   She is not complaining of any fever chills currently does not really have any complaints-earlier today she did hear some creaking of her jaw  Skin does not complain of increased rashes or itching says the rash groin area is improving  Head ears eyes nose mouth and throat is not complain of visual changes or sore throat he did again did hear a little creaking of the right side of her jaw when she moved at this morning-says it felt a little funny but not painful  Respiratory does not complain of shortness of breath or cough.  Cardiac is not complaining of chest pain or edema.  GI does not complain of abdominal discomfort nausea vomiting diarrhea constipation at this point does have some history of constipation in the past Linzess appears to help.  GU  does not complain of dysuria today  Musculoskeletal is not complaining of any joint pain today again she did hear the creaking when she moves her jaw neurologic does not complain of dizziness headache numbness syncope.  Psych depression she is on Lexapro as well as clonazepam with a history of depression with anxiety but this appears to be fairly well-controlled currently  Immunization History  Administered Date(s) Administered  . Influenza Split 08/11/2012  . Influenza,inj,Quad PF,6+ Mos 05/14/2013, 07/19/2014, 06/29/2015, 07/01/2016  . Influenza-Unspecified 06/09/2017, 07/07/2018  . PPD Test 01/23/2018  . Pneumococcal Polysaccharide-23 11/23/2011  . Tdap 01/23/2018   Pertinent  Health Maintenance Due  Topic Date Due  . PAP SMEAR-Modifier  01/23/2019 (Originally [birth year]/02/1978)  . COLONOSCOPY  01/23/2019 (Originally 07/27/2014)  . INFLUENZA VACCINE  04/10/2019  . MAMMOGRAM  04/13/2020   Fall Risk  10/13/2018 03/31/2018 01/22/2018 11/24/2017 06/29/2015  Falls in the past year? 1 Yes Yes Yes Yes  Number falls in past yr: 0 2 or more 1 2 or more 2 or  more  Injury with Fall? 0 Yes Yes Yes Yes  Comment - - - - bruising   Risk Factor Category  - High Fall Risk - High Fall Risk High Fall Risk  Risk for fall due to : - - - - Impaired balance/gait;Impaired mobility;History of fall(s)  Follow up Falls evaluation completed - - Falls evaluation completed -   Functional Status Survey:    She is afebrile pulse is 81 respirations of 18 blood pressure over 91/51 Body mass index is 18.14 kg/m. Physical Exam In general this is a pleasant middle-age female in no distress sitting comfortably in her wheelchair she is bright and alert.  Skin is warm and dry again groin rash could not be assessed because of her positioning today but apparently this is resolving unremarkably.  Eyes visual acuity appears to be intact sclera and conjunctive are clear pupils appear to be equal round reactive to light.  Oropharynx is clear mucous membranes moist tongue is midline with full range of motion.  Head- could not really appreciate any pain with palpation of mandible area on either side-I could not appreciate any deformity when asked to open and close her mouth could not really hear any crepitus or feel any abnormality of her jaw bilaterally.  On lower right mouth she does have what appears some fillings I could not really appreciate any ulcer or lesion inside of her mouth on the right-  Chest is clear to auscultation there is no labored breathing.  Heart is regular rate and rhythm without murmur gallop or rub she does not have significant lower extremity edema abdomen is soft tender with active bowel sounds.  Musculoskeletal generally frail but does move her extremities x4 at baseline neurolyse weakness which is unchanged.  Neurologic tremor most prominently of her head but this is not new neurologic exam continues to be at baseline she is bright and alert.  Psych she is largely alert and oriented very pleasant and appropriate Lab s reviewed:  Labs.   12/04/2018.  WBC 4.8 hemoglobin 12.0 platelets 167.   November 09, 2018  Sodium 140 potassium 4.0 BUN to creatinine 0.44   Recent Labs    01/20/18 11/09/18  NA 144 140  K 3.9 4.0  CL  --  105  CO2  --  26  BUN 20 27*  CREATININE 0.5 0.4*  CALCIUM  --  8.6   No results for input(s): AST, ALT, ALKPHOS, BILITOT, PROT, ALBUMIN in the  last 8760 hours. Recent Labs    09/11/18 11/09/18 12/02/18 12/04/18  WBC 5.4 5.7 4.8  --   NEUTROABS 3  --   --   --   HGB 11.6* 11.0* 12.0  --   HCT 35* 33* 36  --   PLT 170 190  --  167   Lab Results  Component Value Date   TSH 1.15 03/02/2015   Lab Results  Component Value Date   HGBA1C 5.5 10/11/2011   Lab Results  Component Value Date   CHOL 150 12/12/2017   HDL 38 12/12/2017   LDLCALC 101 12/12/2017   LDLDIRECT 165.0 05/14/2013   TRIG 54 12/12/2017   CHOLHDL 4 11/06/2015    Significant Diagnostic Results in last 30 days:  No results found.  Assessment/Plan  #1 jaw creaking-at this point will monitor she does not appear to be having pain with this- she is not having any difficulty swallowing or limited motion of her jaw movement-she says she is not hearing or feeling anything funny currently.  At this point will monitor we are trying to limit x-rays at the facility because we are trying to limit visitations to facility during coronavirus at this point will monitor.  2.  Hypotension she does have a history of lower blood pressures often systolics in the 75Z low [id number] continues to be asymptomatic at this point will monitor it appears a reading over the weekend in the 70s may have been a machine variation but at this point we will keep an eye on it in --with blood pressure checks every shift to see where we stand- she does not complain of any syncope dizziness visual changes from baseline Appears to be doing well today. This was discussed with Dr. Linna Darner and  would like to obtain a cortisol level when the coronavirus precautions  ease we have been conservative ordering labs because of coronavirus concerns and  trying to limit visitors to the facility  [phone] of note greater than 25 minutes spent assessing patient reviewing her chart and labs discussing her status with nursing-and coordinating and formulating a plan of care-- of note greater than 50% of time spent coordinating a plan of care with input as noted above  3 history of cerebellar tremo with neuromuscular disorder --r this appears to be at baseline she is followed by neurology scheduled although this may be complicated by coronavirus- continues on clonazepam for anxiety which appears to be effective   Granville Lewis, PA-C [phone]

## 2019-01-12 DIAGNOSIS — Z85841 Personal history of malignant neoplasm of brain: Secondary | ICD-10-CM | POA: Diagnosis not present

## 2019-01-12 DIAGNOSIS — M6281 Muscle weakness (generalized): Secondary | ICD-10-CM | POA: Diagnosis not present

## 2019-01-12 DIAGNOSIS — R278 Other lack of coordination: Secondary | ICD-10-CM | POA: Diagnosis not present

## 2019-01-12 DIAGNOSIS — R41841 Cognitive communication deficit: Secondary | ICD-10-CM | POA: Diagnosis not present

## 2019-01-12 DIAGNOSIS — Z9181 History of falling: Secondary | ICD-10-CM | POA: Diagnosis not present

## 2019-01-13 DIAGNOSIS — M6281 Muscle weakness (generalized): Secondary | ICD-10-CM | POA: Diagnosis not present

## 2019-01-13 DIAGNOSIS — R278 Other lack of coordination: Secondary | ICD-10-CM | POA: Diagnosis not present

## 2019-01-13 DIAGNOSIS — Z9181 History of falling: Secondary | ICD-10-CM | POA: Diagnosis not present

## 2019-01-13 DIAGNOSIS — Z85841 Personal history of malignant neoplasm of brain: Secondary | ICD-10-CM | POA: Diagnosis not present

## 2019-01-13 DIAGNOSIS — R41841 Cognitive communication deficit: Secondary | ICD-10-CM | POA: Diagnosis not present

## 2019-01-14 DIAGNOSIS — R41841 Cognitive communication deficit: Secondary | ICD-10-CM | POA: Diagnosis not present

## 2019-01-14 DIAGNOSIS — Z9181 History of falling: Secondary | ICD-10-CM | POA: Diagnosis not present

## 2019-01-14 DIAGNOSIS — Z85841 Personal history of malignant neoplasm of brain: Secondary | ICD-10-CM | POA: Diagnosis not present

## 2019-01-14 DIAGNOSIS — M6281 Muscle weakness (generalized): Secondary | ICD-10-CM | POA: Diagnosis not present

## 2019-01-14 DIAGNOSIS — R278 Other lack of coordination: Secondary | ICD-10-CM | POA: Diagnosis not present

## 2019-01-15 DIAGNOSIS — Z85841 Personal history of malignant neoplasm of brain: Secondary | ICD-10-CM | POA: Diagnosis not present

## 2019-01-15 DIAGNOSIS — R41841 Cognitive communication deficit: Secondary | ICD-10-CM | POA: Diagnosis not present

## 2019-01-15 DIAGNOSIS — Z9181 History of falling: Secondary | ICD-10-CM | POA: Diagnosis not present

## 2019-01-15 DIAGNOSIS — R278 Other lack of coordination: Secondary | ICD-10-CM | POA: Diagnosis not present

## 2019-01-15 DIAGNOSIS — M6281 Muscle weakness (generalized): Secondary | ICD-10-CM | POA: Diagnosis not present

## 2019-01-18 ENCOUNTER — Encounter: Payer: Self-pay | Admitting: Internal Medicine

## 2019-01-18 ENCOUNTER — Other Ambulatory Visit: Payer: Self-pay | Admitting: Internal Medicine

## 2019-01-18 ENCOUNTER — Non-Acute Institutional Stay (SKILLED_NURSING_FACILITY): Payer: Medicare Other | Admitting: Internal Medicine

## 2019-01-18 DIAGNOSIS — Z85841 Personal history of malignant neoplasm of brain: Secondary | ICD-10-CM | POA: Diagnosis not present

## 2019-01-18 DIAGNOSIS — R278 Other lack of coordination: Secondary | ICD-10-CM | POA: Diagnosis not present

## 2019-01-18 DIAGNOSIS — Z87898 Personal history of other specified conditions: Secondary | ICD-10-CM

## 2019-01-18 DIAGNOSIS — M810 Age-related osteoporosis without current pathological fracture: Secondary | ICD-10-CM | POA: Diagnosis not present

## 2019-01-18 DIAGNOSIS — M6281 Muscle weakness (generalized): Secondary | ICD-10-CM | POA: Diagnosis not present

## 2019-01-18 DIAGNOSIS — K5900 Constipation, unspecified: Secondary | ICD-10-CM

## 2019-01-18 DIAGNOSIS — R41841 Cognitive communication deficit: Secondary | ICD-10-CM | POA: Diagnosis not present

## 2019-01-18 DIAGNOSIS — D6489 Other specified anemias: Secondary | ICD-10-CM

## 2019-01-18 DIAGNOSIS — G252 Other specified forms of tremor: Secondary | ICD-10-CM | POA: Diagnosis not present

## 2019-01-18 DIAGNOSIS — Z9181 History of falling: Secondary | ICD-10-CM | POA: Diagnosis not present

## 2019-01-18 MED ORDER — CLONAZEPAM 0.25 MG PO TBDP: 0.2500 mg | ORAL_TABLET | Freq: Two times a day (BID) | ORAL | 0 refills | Status: DC

## 2019-01-18 NOTE — Progress Notes (Signed)
Location:    [facility] Room Number: 110/A Place of Service:  SNF (31) Provider: Granville Lewis PA-C  Hendricks Limes, MD  Patient Care Team: Hendricks Limes, MD as PCP - General (Internal Medicine) Larey Dresser, MD as Referring Physician (Cardiology) Larey Dresser, MD (Cardiology) Medina-Vargas, Senaida Lange, NP as Nurse Practitioner (Internal Medicine) Pieter Partridge, DO as Consulting Physician (Neurology)  Extended Emergency Contact Information Primary Emergency Contact: [name], [place] [address] [place] Phone: [phone] Relation: Sister  Code Status: Full Code  Goals of care: Advanced Directive information Advanced Directives 01/18/2019  Does Patient Have a Medical Advance Directive? Yes  Type of Advance Directive (No Data)  Does patient want to make changes to medical advance directive? No - Patient declined  Would patient like information on creating a medical advance directive? -  Pre-existing out of facility DNR order (yellow form or pink MOST form) -     Chief complaint medical management of chronic medical conditions including history of cerebellar tremors As well as history of occult positive stools-osteoporosis-overactive bladder depression and constipation  HPI:  [name] seen today for medical management of chronic diseases.  As noted above.  Patient was recently seen for her "jaw clicking" she is not complaining of that today physical exam continues to be fairly benign in this regard.  She was also seen for perineal rash recently which apparently has improved with topical treatment.  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  She has follow-up by neurology has an appointment in August-she is on clonazepam 0.5 twice daily.  She also has a history of  occult positive stools hemoglobin has shown relative stability it was 12.0 on lab done in late March- she did have a colonoscopy scheduled but this is been delayed because of coronavirus concerns.  She also at one point had significant constipation she actually saw GI for this she is now on Linzess and senna and this appears to be effective.  Regards osteoarthritis the pain appears to be controlled she does have PRN Biofreeze.  She also is on Prolia for history of osteoporosis.  Continues on Myrbetriq for overactive bladder.  Currently she is sitting in her chair comfortably she has no complaints today continues to be quite pleasant.  Of note she did have a fall apparently over the weekend when she was trying to transfer self she denies hitting her head denies any significant discomfort currently-  She also has a history of somewhat low blood pressures she has systolics at times in the 90s but she is asymptomatic and this is been somewhat chronic-this was discussed with Dr. Linna Darner and when coronavirus concerns ease -- we will get a little additional lab work including cortisol level  Currently she is sitting in her wheelchair comfortably has no complaints apparently earlier she had been participating in activities   Past Medical History:  Diagnosis Date  . Astrocytoma brain tumor ([facility]) 1971   astrocytoma treated with radiation  . C. difficile colitis 03/8294   complicated by sepsis/ARDS and prolonged hosp  . Depression   . Hyperlipidemia   . Mitral regurgitation 10/2011   severe, s/p min invasive repair and annuloplasty  . Mitral valve prolapse   . Neuromuscular disorder ([facility])  numbness in hands and feet  . Osteoarthritis   . Osteoporosis    DEXA @ [facility] 11/2012: -3.5, prior tx Reclast, last in 2011, rec change to Prolia  . Overactive bladder   . Pleural effusion, left 11/11/2011  . Vitamin D deficiency    Past Surgical History:  Procedure Laterality Date  . CARDIAC CATHETERIZATION      Jan 2013  . KNEE ARTHROSCOPY Right   . MITRAL VALVE REPAIR  10/15/2011   Procedure: MINIMALLY INVASIVE MITRAL VALVE REPAIR (MVR);  Surgeon: Rexene Alberts, MD;  Location: [facility];  Service: Open Heart Surgery;  Laterality: Right;  . TRANSESOPHAGEAL ECHOCARDIOGRAM  7/12  . US ECHOCARDIOGRAPHY  6/12  . VENTRICULOPERITONEAL SHUNT  1971    Allergies  Allergen Reactions  . Compazine Other (See Comments)    "eyes get buggy"    Allergies as of 01/18/2019      Reactions   Compazine Other (See Comments)   "eyes get buggy"      Medication List       Accurate as of Jan 18, 2019  3:04 PM. If you have any questions, ask your nurse or doctor.        STOP taking these medications   ketoconazole 2 % cream Commonly known as:  NIZORAL Stopped by:  Granville Lewis, PA-C     TAKE these medications   atorvastatin 20 MG tablet Commonly known as:  LIPITOR Take 20 mg by mouth daily.   Biofreeze 4 % Gel Generic drug:  Menthol (Topical Analgesic) Apply 1 application topically 2 (two) times daily. Apply topically BID to sore areas - back   bisacodyl 10 MG suppository Commonly known as:  DULCOLAX Constipation (2 of 4): If not relieved by MOM, give 10 mg Bisacodyl suppositiory rectally X 1 dose in 24 hours as needed (Do not use constipation standing orders for residents with renal failure/CFR less than 30. Contact MD for orders)   cholecalciferol 25 MCG (1000 UT) tablet Commonly known as:  VITAMIN D Take 1,000 Units by mouth daily.   clonazePAM 0.25 MG disintegrating tablet Commonly known as:  KLONOPIN Take 1 tablet (0.25 mg total) by mouth 2 (two) times daily for 60 doses.   denosumab 60 MG/ML Soln injection Commonly known as:  PROLIA Inject 60 mg into the skin every 6 (six) months. Administer in upper arm, thigh, or abdomen   escitalopram 10 MG tablet Commonly known as:  LEXAPRO Take 1 tablet (10 mg total) by mouth daily.   linaclotide 145 MCG Caps capsule Commonly known as:   Linzess TAKE ONE CAPSULE BY MOUTH DAILY BEFORE BREAKFAST   loratadine 10 MG tablet Commonly known as:  CLARITIN Take 10 mg by mouth daily.   LUBRIFRESH P.M. OP Apply 1 application to eye at bedtime. Apply to right eye   Myrbetriq 50 MG Tb24 tablet Generic drug:  mirabegron ER Take 50 mg by mouth daily.   polyethylene glycol 17 g packet Commonly known as:  MIRALAX / GLYCOLAX Take 17 g by mouth daily as needed for mild constipation or moderate constipation.   polyvinyl alcohol 1.4 % ophthalmic solution Commonly known as:  LIQUIFILM TEARS Place 1 drop into both eyes 4 (four) times daily.   psyllium 58.6 % packet Commonly known as:  METAMUCIL Take 1 packet by mouth every Monday, Wednesday, and Friday.   RA SALINE ENEMA RE Constipation (3 of 4): If not relieved by Biscodyl suppository, give disposable Saline Enema rectally X 1 dose/24 hrs as needed (  Do not use constipation standing orders for residents with renal failure/CFR less than 30. Contact MD for orders)   senna-docusate 8.6-50 MG tablet Commonly known as:  Senokot-S Take 2 tablets by mouth at bedtime.       Review of Systems   General she is not complaining of any fever chills.  Skin is not complaining of rashes or itching.  Head ears eyes nose mouth and throat is not complaining of any sore throat visual changes or jaw clicking today.  Respiratory is not complaining of shortness of breath or cough.  Cardiac does not complain of chest pain or edema.  GI is not complaining of abdominal discomfort nausea vomiting diarrhea or constipation- constipation again had been an issue in the past.  GU is not complaining of dysuria.  Musculoskeletal has significant weakness with muscle atrophy but does not complain of joint pain currently.  Neurologic does not complain of dizziness headache numbness does have a history of tremors.  Psych does not complain of depression or anxiety per [facility] testing apparently does have  some cognitive deficits but does well Immunization History  Administered Date(s) Administered  . Influenza Split 08/11/2012  . Influenza,inj,Quad PF,6+ Mos 05/14/2013, 07/19/2014, 06/29/2015, 07/01/2016  . Influenza-Unspecified 06/09/2017, 07/07/2018  . PPD Test 01/23/2018  . Pneumococcal Polysaccharide-23 11/23/2011  . Tdap 01/23/2018   Pertinent  Health Maintenance Due  Topic Date Due  . PAP SMEAR-Modifier  01/23/2019 (Originally [birth year]/02/1978)  . COLONOSCOPY  01/23/2019 (Originally 07/27/2014)  . INFLUENZA VACCINE  04/10/2019  . MAMMOGRAM  04/13/2020   Fall Risk  10/13/2018 03/31/2018 01/22/2018 11/24/2017 06/29/2015  Falls in the past year? 1 Yes Yes Yes Yes  Number falls in past yr: 0 2 or more 1 2 or more 2 or more  Injury with Fall? 0 Yes Yes Yes Yes  Comment - - - - bruising   Risk Factor Category  - High Fall Risk - High Fall Risk High Fall Risk  Risk for fall due to : - - - - Impaired balance/gait;Impaired mobility;History of fall(s)  Follow up Falls evaluation completed - - Falls evaluation completed -   Functional Status Survey:    Vitals:   01/18/19 1430  BP: (!) 98/55  Pulse: 82  Resp: 16  Temp: 97.9 F (36.6 C)  TempSrc: Oral  Weight: 89 lb 9.6 oz (40.6 kg)  Height: 4\' 11"  (1.499 m)  Recent blood pressures appear to be in the high 90s to low 673A systolically which is baseline for her Body mass index is 18.1 kg/m. Physical Exam In general this is a pleasant middle-age female in no distress sitting comfortably in her wheelchair.  Her skin is warm and dry.  Eyes visual acuity appears grossly intact sclera and conjunctive are clear.  Oropharynx is clear mucous membranes moist  Could not appreciate any jaw discomfort today does open her mouth with baseline range of motion.  Chest is clear to auscultation without labored breathing.  Heart is regular rate and rhythm without murmur gallop or rub she does not have significant lower extremity edema.  Abdomen is  soft nontender with positive bowel sounds.  Musculoskeletal has muscle atrophy with general frailty but moves her extremities at baseline.  Neurologic continues with a general tremor most prominently of her head- this is baseline with previous exams she is bright alert interactive.  Psych she appears largely alert and oriented pleasant and appropriate which is her baseline  Chest is clear to auscultation Labs reviewed: December 04, 2018.  WBC 4.8 hemoglobin 12.0 platelets 167 #1 history of cerebellar tremor-she is followed by neurology she will need neurology follow-up this has been complicated  Recent Labs    01/20/18 11/09/18  NA 144 140  K 3.9 4.0  CL  --  105  CO2  --  26  BUN 20 27*  CREATININE 0.5 0.4*  CALCIUM  --  8.6   No results for input(s): AST, ALT, ALKPHOS, BILITOT, PROT, ALBUMIN in the last 8760 hours. Recent Labs    09/11/18 11/09/18 12/02/18 12/04/18  WBC 5.4 5.7 4.8  --   NEUTROABS 3  --   --   --   HGB 11.6* 11.0* 12.0  --   HCT 35* 33* 36  --   PLT 170 190  --  167   Lab Results  Component Value Date   TSH 1.15 03/02/2015   Lab Results  Component Value Date   HGBA1C 5.5 10/11/2011   Lab Results  Component Value Date   CHOL 150 12/12/2017   HDL 38 12/12/2017   LDLCALC 101 12/12/2017   LDLDIRECT 165.0 05/14/2013   TRIG 54 12/12/2017   CHOLHDL 4 11/06/2015    Significant Diagnostic Results in last 30 days:  No results found.  Assessment/Plan  --#1 history of cerebellar tremor- she appears to be had at baseline she has seen neurology and will have neurology follow-up she appears to be doing relatively well with supportive care she does continue on clonazepam. She does have an associated neurocognitive disorder she did receive radiation for her history of astrocytoma.    2.  History of anemia with history of occult positive stool hemoglobin actually has shown stability at 12.0 on lab done in late March again update labs has been complicated by  coronavirus precautions but hemoglobin has remained steady it appears- Dr. Henrene Pastor of GI has canceled her colonoscopy until coronavirus epidemic is resolved-we will try to update her labs when coronavirus precautions ease.  3.  History of constipation Linzess and senna appear to be effective she actually has seen GI in the past for this as well.  4.  History of hyperlipidemia she is on atorvastatin which has been recently increased update lipid panel is pending.--Again this may be delayed somewhat because of coronavirus precautions  #5 history of osteoporosis she continues on Prolia.  6.  History of overactive  bladder she continues on Myrbetriq- she does not complain of dysuria today.  7 history of depression with anxiety again she is on clonazepam this also is to help with her tremors she is on low-dose Lexapro this appears to be stable.  8 Hypotension she has frequent readings systolically under 161 but continues to be asymptomatic as noted above consider getting a cortisol level when coronavirus precautions ease   [id number] 5.-

## 2019-01-19 ENCOUNTER — Encounter: Payer: Self-pay | Admitting: Internal Medicine

## 2019-01-19 DIAGNOSIS — M6281 Muscle weakness (generalized): Secondary | ICD-10-CM | POA: Diagnosis not present

## 2019-01-19 DIAGNOSIS — Z85841 Personal history of malignant neoplasm of brain: Secondary | ICD-10-CM | POA: Diagnosis not present

## 2019-01-19 DIAGNOSIS — R278 Other lack of coordination: Secondary | ICD-10-CM | POA: Diagnosis not present

## 2019-01-19 DIAGNOSIS — Z9181 History of falling: Secondary | ICD-10-CM | POA: Diagnosis not present

## 2019-01-19 DIAGNOSIS — R41841 Cognitive communication deficit: Secondary | ICD-10-CM | POA: Diagnosis not present

## 2019-01-20 DIAGNOSIS — Z85841 Personal history of malignant neoplasm of brain: Secondary | ICD-10-CM | POA: Diagnosis not present

## 2019-01-20 DIAGNOSIS — M6281 Muscle weakness (generalized): Secondary | ICD-10-CM | POA: Diagnosis not present

## 2019-01-20 DIAGNOSIS — Z9181 History of falling: Secondary | ICD-10-CM | POA: Diagnosis not present

## 2019-01-20 DIAGNOSIS — R41841 Cognitive communication deficit: Secondary | ICD-10-CM | POA: Diagnosis not present

## 2019-01-20 DIAGNOSIS — R278 Other lack of coordination: Secondary | ICD-10-CM | POA: Diagnosis not present

## 2019-01-21 DIAGNOSIS — Z85841 Personal history of malignant neoplasm of brain: Secondary | ICD-10-CM | POA: Diagnosis not present

## 2019-01-21 DIAGNOSIS — R41841 Cognitive communication deficit: Secondary | ICD-10-CM | POA: Diagnosis not present

## 2019-01-21 DIAGNOSIS — Z9181 History of falling: Secondary | ICD-10-CM | POA: Diagnosis not present

## 2019-01-21 DIAGNOSIS — R278 Other lack of coordination: Secondary | ICD-10-CM | POA: Diagnosis not present

## 2019-01-21 DIAGNOSIS — M6281 Muscle weakness (generalized): Secondary | ICD-10-CM | POA: Diagnosis not present

## 2019-01-22 DIAGNOSIS — Z85841 Personal history of malignant neoplasm of brain: Secondary | ICD-10-CM | POA: Diagnosis not present

## 2019-01-22 DIAGNOSIS — R278 Other lack of coordination: Secondary | ICD-10-CM | POA: Diagnosis not present

## 2019-01-22 DIAGNOSIS — R41841 Cognitive communication deficit: Secondary | ICD-10-CM | POA: Diagnosis not present

## 2019-01-22 DIAGNOSIS — Z9181 History of falling: Secondary | ICD-10-CM | POA: Diagnosis not present

## 2019-01-22 DIAGNOSIS — M6281 Muscle weakness (generalized): Secondary | ICD-10-CM | POA: Diagnosis not present

## 2019-01-25 DIAGNOSIS — Z9181 History of falling: Secondary | ICD-10-CM | POA: Diagnosis not present

## 2019-01-25 DIAGNOSIS — R278 Other lack of coordination: Secondary | ICD-10-CM | POA: Diagnosis not present

## 2019-01-25 DIAGNOSIS — R41841 Cognitive communication deficit: Secondary | ICD-10-CM | POA: Diagnosis not present

## 2019-01-25 DIAGNOSIS — Z85841 Personal history of malignant neoplasm of brain: Secondary | ICD-10-CM | POA: Diagnosis not present

## 2019-01-25 DIAGNOSIS — M6281 Muscle weakness (generalized): Secondary | ICD-10-CM | POA: Diagnosis not present

## 2019-01-26 DIAGNOSIS — Z9181 History of falling: Secondary | ICD-10-CM | POA: Diagnosis not present

## 2019-01-26 DIAGNOSIS — R278 Other lack of coordination: Secondary | ICD-10-CM | POA: Diagnosis not present

## 2019-01-26 DIAGNOSIS — M6281 Muscle weakness (generalized): Secondary | ICD-10-CM | POA: Diagnosis not present

## 2019-01-26 DIAGNOSIS — Z85841 Personal history of malignant neoplasm of brain: Secondary | ICD-10-CM | POA: Diagnosis not present

## 2019-01-26 DIAGNOSIS — R41841 Cognitive communication deficit: Secondary | ICD-10-CM | POA: Diagnosis not present

## 2019-01-27 DIAGNOSIS — Z79899 Other long term (current) drug therapy: Secondary | ICD-10-CM | POA: Diagnosis not present

## 2019-01-27 DIAGNOSIS — E785 Hyperlipidemia, unspecified: Secondary | ICD-10-CM | POA: Diagnosis not present

## 2019-01-27 LAB — LIPID PANEL
Cholesterol: 135 (ref 0–200)
HDL: 42 (ref 35–70)
LDL Cholesterol: 85
LDl/HDL Ratio: 3.2
Triglycerides: 41 (ref 40–160)

## 2019-01-27 LAB — HEPATIC FUNCTION PANEL: Alkaline Phosphatase: 77 (ref 25–125)

## 2019-02-02 ENCOUNTER — Other Ambulatory Visit: Payer: Self-pay

## 2019-02-02 MED ORDER — CLONAZEPAM 0.25 MG PO TBDP: 0.2500 mg | ORAL_TABLET | Freq: Two times a day (BID) | ORAL | 0 refills | Status: DC

## 2019-02-10 ENCOUNTER — Encounter: Payer: Self-pay | Admitting: Internal Medicine

## 2019-02-10 ENCOUNTER — Non-Acute Institutional Stay (SKILLED_NURSING_FACILITY): Payer: Medicare Other | Admitting: Internal Medicine

## 2019-02-10 DIAGNOSIS — K5909 Other constipation: Secondary | ICD-10-CM | POA: Diagnosis not present

## 2019-02-10 DIAGNOSIS — N3281 Overactive bladder: Secondary | ICD-10-CM | POA: Diagnosis not present

## 2019-02-10 DIAGNOSIS — I959 Hypotension, unspecified: Secondary | ICD-10-CM

## 2019-02-10 DIAGNOSIS — G252 Other specified forms of tremor: Secondary | ICD-10-CM | POA: Diagnosis not present

## 2019-02-10 DIAGNOSIS — M159 Polyosteoarthritis, unspecified: Secondary | ICD-10-CM | POA: Diagnosis not present

## 2019-02-10 DIAGNOSIS — R419 Unspecified symptoms and signs involving cognitive functions and awareness: Secondary | ICD-10-CM | POA: Diagnosis not present

## 2019-02-10 NOTE — Progress Notes (Signed)
Location:    [facility] Room Number: 110/A Place of Service:  SNF (31) Provider: Granville Lewis PA-C  Hendricks Limes, MD  Patient Care Team: Hendricks Limes, MD as PCP - General (Internal Medicine) Larey Dresser, MD as Referring Physician (Cardiology) Larey Dresser, MD (Cardiology) Medina-Vargas, Senaida Lange, NP as Nurse Practitioner (Internal Medicine) Pieter Partridge, DO as Consulting Physician (Neurology)  Extended Emergency Contact Information Primary Emergency Contact: [name], [place] [address] [place] Phone: [phone] Relation: Sister  Code Status:  Full Code Goals of care: Advanced Directive information Advanced Directives 02/10/2019  Does Patient Have a Medical Advance Directive? Yes  Type of Advance Directive (No Data)  Does patient want to make changes to medical advance directive? No - Patient declined  Would patient like information on creating a medical advance directive? -  Pre-existing out of facility DNR order (yellow form or pink MOST form) -    Complaint routine visit for medical management of chronic medical issues including history of cerebellar tremor with history of astrocytoma in the past- history of overactive bladder as well as constipation depression osteoarthritis hyperlipidemia depression with anxiety and osteoporosis as well as overactive bladder   HPI:  [name] seen today for medical management of chronic diseases.  As noted above.  She appears to have a period of stability here- not really have any complaints today.  In regards to her cerebellar tremor-she is followed by neurology does have a history of a cerebellar strokes status post treatment with radiation he does have a neurology appointment in August  She continues on clonazepam 0.5 twice daily to help with this.  She also has some history of occult positive stools in the plaster hemoglobin  has been stable most recently 12.0 on lab done in late March- she had a colonoscopy scheduled but this was delayed because of coronavirus concerns- this is followed by Dr. Henrene Pastor.  She also had a history of constipation but she is now on Linzess as well as senna and apparently this has improved she says at times she has limited bowel movements but apparently this has been fairly stable.  She does have a history of significant osteoarthritis she does have orders for Biofreeze as needed and this appears to help.  She also has a history of osteoporosis and continues on Prolia  At times she has listed low blood pressures at times systolically in the 37J I see 1 actually listed in the high 80s recently- he remains asymptomatic of attention however does not complain of any dizziness headache feeling faint or increasingly weak- I took it manually today and got 110/70  There is consideration to obtain a cortisol level when coronavirus concerns ease  Currently she is sitting in her wheelchair comfortably she actually ambulates fairly well with her wheelchair and actually was going out the door when I caught her and asked if I could do an assessment which --as usual-- she is quite agreeable to     Past Medical History:  Diagnosis Date  . 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 C. difficile colitis 02/9677   complicated by sepsis/ARDS and prolonged hosp  . Depression   . Hyperlipidemia   . Mitral regurgitation 10/2011   severe, s/p min invasive repair and annuloplasty  . Mitral valve prolapse   .  Neuromuscular disorder (HCC)    numbness in hands and feet  . Osteoarthritis   . Osteoporosis    DEXA @ [facility] 11/2012: -3.5, prior tx Reclast, last in 2011, rec change to Prolia  . Overactive bladder   . Pleural effusion, left 11/11/2011  . Vitamin D deficiency    Past Surgical History:  Procedure Laterality Date  . CARDIAC CATHETERIZATION     Jan 2013  . KNEE  ARTHROSCOPY Right   . MITRAL VALVE REPAIR  10/15/2011   Procedure: MINIMALLY INVASIVE MITRAL VALVE REPAIR (MVR);  Surgeon: Rexene Alberts, MD;  Location: [facility];  Service: Open Heart Surgery;  Laterality: Right;  . TRANSESOPHAGEAL ECHOCARDIOGRAM  7/12  . US ECHOCARDIOGRAPHY  6/12  . VENTRICULOPERITONEAL SHUNT  1971    Allergies  Allergen Reactions  . Compazine Other (See Comments)    "eyes get buggy"    Allergies as of 02/10/2019      Reactions   Compazine Other (See Comments)   "eyes get buggy"      Medication List       Accurate as of February 10, 2019  1:46 PM. If you have any questions, ask your nurse or doctor.        atorvastatin 20 MG tablet Commonly known as:  LIPITOR Take 20 mg by mouth daily.   Biofreeze 4 % Gel Generic drug:  Menthol (Topical Analgesic) Apply 1 application topically 2 (two) times daily. Apply topically BID to sore areas - back   bisacodyl 10 MG suppository Commonly known as:  DULCOLAX Constipation (2 of 4): If not relieved by MOM, give 10 mg Bisacodyl suppositiory rectally X 1 dose in 24 hours as needed (Do not use constipation standing orders for residents with renal failure/CFR less than 30. Contact MD for orders)   cholecalciferol 25 MCG (1000 UT) tablet Commonly known as:  VITAMIN D Take 1,000 Units by mouth daily.   clonazePAM 0.25 MG disintegrating tablet Commonly known as:  KLONOPIN Take 1 tablet (0.25 mg total) by mouth 2 (two) times daily for 60 doses.   denosumab 60 MG/ML Soln injection Commonly known as:  PROLIA Inject 60 mg into the skin every 6 (six) months. Administer in upper arm, thigh, or abdomen   escitalopram 10 MG tablet Commonly known as:  LEXAPRO Take 1 tablet (10 mg total) by mouth daily.   linaclotide 145 MCG Caps capsule Commonly known as:  Linzess TAKE ONE CAPSULE BY MOUTH DAILY BEFORE BREAKFAST   loratadine 10 MG tablet Commonly known as:  CLARITIN Take 10 mg by mouth daily.   LUBRIFRESH P.M. OP Apply 1  application to eye at bedtime. Apply to right eye   Myrbetriq 50 MG Tb24 tablet Generic drug:  mirabegron ER Take 50 mg by mouth daily.   polyethylene glycol 17 g packet Commonly known as:  MIRALAX / GLYCOLAX Take 17 g by mouth daily as needed for mild constipation or moderate constipation.   polyvinyl alcohol 1.4 % ophthalmic solution Commonly known as:  LIQUIFILM TEARS Place 1 drop into both eyes 4 (four) times daily.   psyllium 58.6 % packet Commonly known as:  METAMUCIL Take 1 packet by mouth every Monday, Wednesday, and Friday.   RA SALINE ENEMA RE Constipation (3 of 4): If not relieved by Biscodyl suppository, give disposable Saline Enema rectally X 1 dose/24 hrs as needed (Do not use constipation standing orders for residents with renal failure/CFR less than 30. Contact MD for orders)   senna-docusate 8.6-50 MG tablet  Commonly known as:  Senokot-S Take 2 tablets by mouth at bedtime.       Review of Systems   In general she is not complaining of any fever chills.  Skin does not report any rashes or itching at some point she does have perineal rash but is not complaining of that today  Head ears eyes nose mouth and throat does not complain of any sore throat visual changes-at one point she complained of her jaw clicking but she has not complained of this in several weeks.  Respiratory is not complaining of shortness of breath or cough.  Cardiac denies chest pain or edema.  GI does not complain of abdominal discomfort nausea vomiting diarrhea or significant constipation sometimes she says she has limited bowel movements but apparently this has been pretty stable.  GU does not complain of dysuria does have a history of overactive bladder is on Myrbetriq.  Musculoskeletal has significant weakness with atrophy.  Neurologic is not complaining of feeling dizzy having a headache feeling syncopal or having numbness.  Psych does have a history of some depression with  anxiety but this appears well controlled she is on clonazepam as well as low-dose Lexapro.  She does have some history of mild cognitive deficits    Immunization History  Administered Date(s) Administered  . Influenza Split 08/11/2012  . Influenza,inj,Quad PF,6+ Mos 05/14/2013, 07/19/2014, 06/29/2015, 07/01/2016  . Influenza-Unspecified 06/09/2017, 07/07/2018  . PPD Test 01/23/2018  . Pneumococcal Polysaccharide-23 11/23/2011  . Tdap 01/23/2018   Pertinent  Health Maintenance Due  Topic Date Due  . PAP SMEAR-Modifier  03/12/2019 (Originally 12/02/1978)  . COLONOSCOPY  03/12/2019 (Originally 07/27/2014)  . INFLUENZA VACCINE  04/10/2019  . MAMMOGRAM  04/13/2020   Fall Risk  10/13/2018 03/31/2018 01/22/2018 11/24/2017 06/29/2015  Falls in the past year? 1 Yes Yes Yes Yes  Number falls in past yr: 0 2 or more 1 2 or more 2 or more  Injury with Fall? 0 Yes Yes Yes Yes  Comment - - - - bruising   Risk Factor Category  - High Fall Risk - High Fall Risk High Fall Risk  Risk for fall due to : - - - - Impaired balance/gait;Impaired mobility;History of fall(s)  Follow up Falls evaluation completed - - Falls evaluation completed -   Functional Status Survey:    Vitals:   02/10/19 1338  BP: (!) 89/56  Pulse: 81  Resp: 20  Temp: 97.6 F (36.4 C)  TempSrc: Oral  SpO2: 96%  I did take her blood pressure manually and got 110/70  Physical Exam   In general this is a pleasant female in no distress sitting comfortably in her wheelchair.  Skin is warm and dry.  Eyes visual acuity appears to be intact sclera and conjunctive are clear.  Oropharynx is clear mucous membranes moist.  Chest is clear to auscultation with shallow air entry there is no labored breathing.  Heart is regular rate and rhythm without murmur gallop or rub she does not have significant lower extremity edema.  Abdomen is soft nontender with positive bowel sounds.  Muscle although has diffuse muscle atrophy in general  frailty moves her extremities at baseline is actually able to ambulate in her wheelchair fairly well appears to be motivated.  Neurologic has generalized tremors most prominently of her torso head give somewhat of a bobbing appearance.  At baseline.  Psych she is pleasant and appropriate.  Is able to carry on a conversation largely appropriately    Labs  reviewed:  Jan 27, 2019.  LDL was 85.  Liver function tests within normal limits.  December 04, 2018.  WBC 4.8 hemoglobin 12.0 platelets 167.  November 09, 2018.  WBC 5.7 hemoglobin 11.0 platelets 190.  Sodium 140 potassium 4 BUN 27.2 creatinine 0.44.   Recent Labs    11/09/18  NA 140  K 4.0  CL 105  CO2 26  BUN 27*  CREATININE 0.4*  CALCIUM 8.6   No results for input(s): AST, ALT, ALKPHOS, BILITOT, PROT, ALBUMIN in the last 8760 hours. Recent Labs    09/11/18 11/09/18 12/02/18 12/04/18  WBC 5.4 5.7 4.8  --   NEUTROABS 3  --   --   --   HGB 11.6* 11.0* 12.0  --   HCT 35* 33* 36  --   PLT 170 190  --  167   Lab Results  Component Value Date   TSH 1.15 03/02/2015   Lab Results  Component Value Date   HGBA1C 5.5 10/11/2011   Lab Results  Component Value Date   CHOL 150 12/12/2017   HDL 38 12/12/2017   LDLCALC 101 12/12/2017   LDLDIRECT 165.0 05/14/2013   TRIG 54 12/12/2017   CHOLHDL 4 11/06/2015    Significant Diagnostic Results in last 30 days:  No results found.  Assessment/Plan  #1 history of cerebellar tremor with history of astrocytoma in the past with radiation.  She is followed by neurology she appears to be doing well with supportive care.  She does have associated neurocognitive disorder but again appears to be pleasant appropriate nursing does not report any behaviors.  2.  History of anemia with history of occult positive stool hemoglobin has been relatively stable at 12.0 on late March lab- again updated labs have been complicated by coronavirus precautions but clinically she appears to be  stable.  Her colonoscopy has been canceled for now because of coronavirus epidemic- she is followed by Dr. Henrene Pastor gastroenterology.  3.  History of osteoporosis she continues on Prolia.  4.  History of constipation this is been an issue in the past but appears to be fairly stable on Linzess once a day and senna 2 tabs at bedtime at this point will monitor she says at times her bowel movements are somewhat scattered but apparently has not been a persistent problem.  5.  History of hyperlipidemia she is on atorvastatin LDL was 85 on lab done last month.  6.  History of overactive bladder she continues on Myrbetriq she is not having complaints of that today.  7 history of osteoarthritis she continues with Biofreeze as needed she is not really complaining of joint pain.  8.  History of hypotensive readings she appears to be asymptomatic here- blood pressures generally run from the 99I systolically to the lower 100s I got 110/70 today she does not complain of dizziness headache weakness or feeling faint.  9.  History of depression with anxiety she continues on clonazepam twice daily also has low-dose Lexapro this appears to be stable  [id number]

## 2019-02-17 ENCOUNTER — Non-Acute Institutional Stay (SKILLED_NURSING_FACILITY): Payer: Medicare Other | Admitting: Adult Health

## 2019-02-17 ENCOUNTER — Encounter: Payer: Self-pay | Admitting: Adult Health

## 2019-02-17 DIAGNOSIS — Z7189 Other specified counseling: Secondary | ICD-10-CM

## 2019-02-17 DIAGNOSIS — N3281 Overactive bladder: Secondary | ICD-10-CM

## 2019-02-17 DIAGNOSIS — R251 Tremor, unspecified: Secondary | ICD-10-CM

## 2019-02-17 DIAGNOSIS — M818 Other osteoporosis without current pathological fracture: Secondary | ICD-10-CM

## 2019-02-17 DIAGNOSIS — K5909 Other constipation: Secondary | ICD-10-CM

## 2019-02-17 DIAGNOSIS — F339 Major depressive disorder, recurrent, unspecified: Secondary | ICD-10-CM

## 2019-02-17 DIAGNOSIS — C719 Malignant neoplasm of brain, unspecified: Secondary | ICD-10-CM

## 2019-02-17 MED ORDER — CLONAZEPAM 0.25 MG PO TBDP: 0.2500 mg | ORAL_TABLET | Freq: Every day | ORAL | Status: DC | PRN

## 2019-02-17 MED ORDER — CLONAZEPAM 0.25 MG PO TBDP: 0.2500 mg | ORAL_TABLET | Freq: Every day | ORAL | 0 refills | Status: DC | PRN

## 2019-02-17 NOTE — Progress Notes (Addendum)
Location:  [facility] Room Number: 101B Place of Service:  SNF (31) Provider:  Durenda Age, DNP, FNP-BC  Patient Care Team: Hendricks Limes, MD as PCP - General (Internal Medicine) Larey Dresser, MD as Referring Physician (Cardiology) Larey Dresser, MD (Cardiology) Medina-Vargas, Senaida Lange, NP as Nurse Practitioner (Internal Medicine) Pieter Partridge, DO as Consulting Physician (Neurology)  Extended Emergency Contact Information Primary Emergency Contact: [name], [place] [address] [place] Phone: [phone] Relation: Sister  Code Status:  Full Code  Goals of care: Advanced Directive information Advanced Directives 02/10/2019  Does Patient Have a Medical Advance Directive? Yes  Type of Advance Directive (No Data)  Does patient want to make changes to medical advance directive? No - Patient declined  Would patient like information on creating a medical advance directive? -  Pre-existing out of facility DNR order (yellow form or pink MOST form) -     Chief Complaint  Patient presents with  . Acute Visit    Advance Care Planning    HPI:  [name] seen today for advance care planning in her room attended by resident, sister [name], NP, dietary, social worker, ADON and DON. Resident wants to continue being full code. Sister [name] wanted to know how much is her cash balance so she can buy from the [employer]. DON said that she is going to check. She requested for wheat breadDietary discussed that she has stable weight. Latest weight is 87.8 lbs. Resident verbalized that she has occasional increased tremors and has difficulty performing some of the ADLs like brushing her teeth and requested for the staff to stay beside her while she does certain activities. Low dose PRN Clonazepam was recommended for 14 days. Resident verbalized that she has hallucinations when she takes high dose of Clonazepam. Resident  denies feeling depressed. Her sister calls her daily. She requested that her hearing aides be given to her everyday by the nurses. Meeting lasted for 20 minutes.   Past Medical History:  Diagnosis Date  . 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 C. difficile colitis 12/4032   complicated by sepsis/ARDS and prolonged hosp  . Depression   . Hyperlipidemia   . Mitral regurgitation 10/2011   severe, s/p min invasive repair and annuloplasty  . Mitral valve prolapse   . Neuromuscular disorder (HCC)    numbness in hands and feet  . Osteoarthritis   . Osteoporosis    DEXA @ [facility] 11/2012: -3.5, prior tx Reclast, last in 2011, rec change to Prolia  . Overactive bladder   . Pleural effusion, left 11/11/2011  . Vitamin D deficiency    Past Surgical History:  Procedure Laterality Date  . CARDIAC CATHETERIZATION     Jan 2013  . KNEE ARTHROSCOPY Right   . MITRAL VALVE REPAIR  10/15/2011   Procedure: MINIMALLY INVASIVE MITRAL VALVE REPAIR (MVR);  Surgeon: Rexene Alberts, MD;  Location: [facility];  Service: Open Heart Surgery;  Laterality: Right;  . TRANSESOPHAGEAL ECHOCARDIOGRAM  7/12  . US ECHOCARDIOGRAPHY  6/12  . VENTRICULOPERITONEAL SHUNT  1971    Allergies  Allergen Reactions  . Compazine Other (See Comments)    "eyes get buggy"    Outpatient Encounter Medications as of 02/17/2019  Medication Sig  . atorvastatin (LIPITOR) 20 MG tablet Take 20 mg by mouth daily.   . bisacodyl (DULCOLAX)  10 MG suppository Constipation (2 of 4): If not relieved by MOM, give 10 mg Bisacodyl suppositiory rectally X 1 dose in 24 hours as needed (Do not use constipation standing orders for residents with renal failure/CFR less than 30. Contact MD for orders)  . cholecalciferol (VITAMIN D) 1000 units tablet Take 1,000 Units by mouth daily.  . clonazePAM (KLONOPIN) 0.25 MG disintegrating tablet Take 1 tablet (0.25 mg total) by mouth 2 (two) times daily for 60 doses.  . clonazePAM  (KLONOPIN) 0.25 MG disintegrating tablet Take 1 tablet (0.25 mg total) by mouth daily as needed for up to 14 days for seizure.  Marland Kitchen denosumab (PROLIA) 60 MG/ML SOLN injection Inject 60 mg into the skin every 6 (six) months. Administer in upper arm, thigh, or abdomen  . escitalopram (LEXAPRO) 10 MG tablet Take 1 tablet (10 mg total) by mouth daily.  Marland Kitchen linaclotide (LINZESS) 145 MCG CAPS capsule TAKE ONE CAPSULE BY MOUTH DAILY BEFORE BREAKFAST  . loratadine (CLARITIN) 10 MG tablet Take 10 mg by mouth daily.  . Menthol, Topical Analgesic, (BIOFREEZE) 4 % GEL Apply 1 application topically 2 (two) times daily. Apply topically BID to sore areas - back  . mirabegron ER (MYRBETRIQ) 50 MG TB24 tablet Take 50 mg by mouth daily.  . polyethylene glycol (MIRALAX / GLYCOLAX) packet Take 17 g by mouth daily as needed for mild constipation or moderate constipation.  . polyvinyl alcohol (LIQUIFILM TEARS) 1.4 % ophthalmic solution Place 1 drop into both eyes 4 (four) times daily.   . psyllium (METAMUCIL) 58.6 % packet Take 1 packet by mouth every Monday, Wednesday, and Friday.  . senna-docusate (SENOKOT-S) 8.6-50 MG tablet Take 2 tablets by mouth at bedtime.  . Sodium Phosphates (RA SALINE ENEMA RE) Constipation (3 of 4): If not relieved by Biscodyl suppository, give disposable Saline Enema rectally X 1 dose/24 hrs as needed (Do not use constipation standing orders for residents with renal failure/CFR less than 30. Contact MD for orders)  . White Petrolatum-Mineral Oil (LUBRIFRESH P.M. OP) Apply 1 application to eye at bedtime. Apply to right eye  . [DISCONTINUED] clonazePAM (KLONOPIN) disintegrating tablet 0.25 mg    No facility-administered encounter medications on file as of 02/17/2019.     Review of Systems  GENERAL: No change in appetite, no fatigue, no weight changes, no fever, chills or weakness MOUTH and THROAT: Denies oral discomfort, gingival pain or bleeding, pain from teeth or hoarseness   RESPIRATORY:  no cough, SOB, DOE, wheezing, hemoptysis CARDIAC: No chest pain, edema or palpitations GI: No abdominal pain, diarrhea, constipation, heart burn, nausea or vomiting NEUROLOGICAL: Denies dizziness, syncope, numbness, or headache PSYCHIATRIC: Denies feelings of depression or anxiety. No report of hallucinations, insomnia, paranoia, or agitation    Immunization History  Administered Date(s) Administered  . Influenza Split 08/11/2012  . Influenza,inj,Quad PF,6+ Mos 05/14/2013, 07/19/2014, 06/29/2015, 07/01/2016  . Influenza-Unspecified 06/09/2017, 07/07/2018  . PPD Test 01/23/2018  . Pneumococcal Polysaccharide-23 11/23/2011  . Tdap 01/23/2018   Pertinent  Health Maintenance Due  Topic Date Due  . PAP SMEAR-Modifier  03/12/2019 (Originally 12/02/1978)  . COLONOSCOPY  03/12/2019 (Originally 07/27/2014)  . INFLUENZA VACCINE  04/10/2019  . MAMMOGRAM  04/13/2020   Fall Risk  10/13/2018 03/31/2018 01/22/2018 11/24/2017 06/29/2015  Falls in the past year? 1 Yes Yes Yes Yes  Number falls in past yr: 0 2 or more 1 2 or more 2 or more  Injury with Fall? 0 Yes Yes Yes Yes  Comment - - - - bruising  Risk Factor Category  - High Fall Risk - High Fall Risk High Fall Risk  Risk for fall due to : - - - - Impaired balance/gait;Impaired mobility;History of fall(s)  Follow up Falls evaluation completed - - Falls evaluation completed -     Vitals:   02/17/19 1624  BP: 108/60  Pulse: 86  Temp: 98 F (36.7 C)  Weight: 87 lb 12.8 oz (39.8 kg)  Height: 4\' 11"  (1.499 m)   Body mass index is 17.73 kg/m.  Physical Exam  GENERAL APPEARANCE:  In no acute distress.  SKIN:  Skin is warm and dry.  MOUTH and THROAT: Lips are without lesions. Oral mucosa is moist and without lesions. Tongue is normal in shape, size, and color and without lesions RESPIRATORY: Breathing is even & unlabored, BS CTAB CARDIAC: RRR, no murmur,no extra heart sounds, no edema GI: Abdomen soft, normal BS, no masses, no tenderness  EXTREMITIES:  Able to move X 4 extremities NEUROLOGICAL: +Tremors on the upper torso. Speech is clear. Alert and oriented X 3. PSYCHIATRIC:  Affect and behavior are appropriate  Labs reviewed: Recent Labs    11/09/18  NA 140  K 4.0  CL 105  CO2 26  BUN 27*  CREATININE 0.4*  CALCIUM 8.6    Recent Labs    09/11/18 11/09/18 12/02/18 12/04/18  WBC 5.4 5.7 4.8  --   NEUTROABS 3  --   --   --   HGB 11.6* 11.0* 12.0  --   HCT 35* 33* 36  --   PLT 170 190  --  167   Lab Results  Component Value Date   TSH 1.15 03/02/2015   Lab Results  Component Value Date   HGBA1C 5.5 10/11/2011   Lab Results  Component Value Date   CHOL 150 12/12/2017   HDL 38 12/12/2017   LDLCALC 101 12/12/2017   LDLDIRECT 165.0 05/14/2013   TRIG 54 12/12/2017   CHOLHDL 4 11/06/2015     Assessment/Plan  1. Tremors of nervous system - has increased tremors at times, will continue with clonazepam 0.25 mg 1 tab twice a day and add clonazepam 0.25 mg 1/2 tab = 0.125 mg daily PRN x14 days, fall precautions, monitor for hallucinations  2. Brain tumor, astrocytoma ([place]) -Denies having headache nor change in vision, follows up with neurology  3. Chronic constipation -Stable, continue Linzess 145 mcg 1 capsule daily, senna plus 2 tabs at bedtime, MiraLAX as needed  4. OAB (overactive bladder) -Continue Myrbetriq ER 50 mg 1 tab daily  5. Other osteoporosis without current pathological fracture -Has not had any recent fractures, continue Prolia 60 mg/ML subcutaneously every 6 months  6. Depression, recurrent ([place]) -Denies depression, continue escitalopram 10 mg 1 tab daily  7. Advance care planning - sister attended via conference call with IDT, discussed medications, code status and plan of care   Family/ staff Communication: Discussed plan of care with resident, sister and IDT.  Labs/tests ordered:  None  Goals of care:  Long-term care   Durenda Age, DNP, FNP-BC [facility] [phone] (Monday-Friday 8:00 a.m. - 5:00 p.m.) [phone] (after hours)

## 2019-03-08 ENCOUNTER — Other Ambulatory Visit: Payer: Self-pay | Admitting: Internal Medicine

## 2019-03-08 ENCOUNTER — Non-Acute Institutional Stay (SKILLED_NURSING_FACILITY): Payer: Medicare Other | Admitting: Internal Medicine

## 2019-03-08 ENCOUNTER — Encounter: Payer: Self-pay | Admitting: Internal Medicine

## 2019-03-08 DIAGNOSIS — F418 Other specified anxiety disorders: Secondary | ICD-10-CM | POA: Diagnosis not present

## 2019-03-08 DIAGNOSIS — I959 Hypotension, unspecified: Secondary | ICD-10-CM

## 2019-03-08 DIAGNOSIS — G709 Myoneural disorder, unspecified: Secondary | ICD-10-CM

## 2019-03-08 MED ORDER — CLONAZEPAM 0.25 MG PO TBDP: 0.2500 mg | ORAL_TABLET | Freq: Two times a day (BID) | ORAL | 0 refills | Status: DC

## 2019-03-08 NOTE — Progress Notes (Signed)
Location:  [facility] Room Number: 101-B Place of Service:  SNF (31) Provider:  Granville Lewis, PA-C  Hendricks Limes, MD  Patient Care Team: Hendricks Limes, MD as PCP - General (Internal Medicine) Larey Dresser, MD as Referring Physician (Cardiology) Larey Dresser, MD (Cardiology) Medina-Vargas, Senaida Lange, NP as Nurse Practitioner (Internal Medicine) Pieter Partridge, DO as Consulting Physician (Neurology)  Extended Emergency Contact Information Primary Emergency Contact: [name], [place] [address] [place] Phone: [phone] Relation: Sister  Code Status:  Full Code Goals of care: Advanced Directive information Advanced Directives 02/10/2019  Does Patient Have a Medical Advance Directive? Yes  Type of Advance Directive (No Data)  Does patient want to make changes to medical advance directive? No - Patient declined  Would patient like information on creating a medical advance directive? -  Pre-existing out of facility DNR order (yellow form or pink MOST form) -     Chief Complaint  Patient presents with  . Acute Visit    Patient is seen for medication management to assess the continued need for clonazepam.    HPI:  [name] seen today for follow-up of tremors with orders for clonazepam 0.25 mg routinely twice daily as well as a smaller dose 0.125 mg as needed once a day.  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  She was seen recently by [facility] nurse practitioner and PRN low-dose clonazepam 0.125 mg once a day was added to help additionally with her tremors.  She continues on 0.25 mg twice daily routinely.  Currently she says she is doing well and this appears to be helping.  She continues to have some upper torso upper extremity tremors but is thought she does benefit from the clonazepam which was recommended by neurology.   Vital signs appear to be stable she continues to have some low blood pressures at times but this is not unusual and appears to be well-tolerated.  This was discussed with Dr. Linna Darner and we would like to get a cortisol level when coronavirus quarantine ends-at this point secondary to her stability without symptoms we have been waiting for the quarantine to ease before doing aggressive laboratory work.  Currently she has no complaints in fact she is humoring  me by let me examine her before she goes to activities      Past Medical History:  Diagnosis Date  . 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 C. difficile colitis 04/1828   complicated by sepsis/ARDS and prolonged hosp  . Depression   . Hyperlipidemia   . Mitral regurgitation 10/2011   severe, s/p min invasive repair and annuloplasty  . Mitral valve prolapse   . Neuromuscular disorder ([facility])    numbness in hands and feet  . Osteoarthritis   . Osteoporosis    DEXA @ [facility] 11/2012: -3.5, prior tx Reclast, last in 2011, rec change to Prolia  . Overactive bladder   . Pleural effusion, left 11/11/2011  . Vitamin D deficiency    Past Surgical History:  Procedure Laterality Date  . CARDIAC CATHETERIZATION     Jan 2013  . KNEE ARTHROSCOPY Right   . MITRAL VALVE REPAIR  10/15/2011   Procedure: MINIMALLY INVASIVE MITRAL VALVE REPAIR (MVR);  Surgeon: Rexene Alberts, MD;  Location: [facility];  Service: Open Heart Surgery;  Laterality: Right;  . TRANSESOPHAGEAL ECHOCARDIOGRAM  7/12  . US ECHOCARDIOGRAPHY  6/12  . VENTRICULOPERITONEAL SHUNT  1971    Allergies  Allergen Reactions  . Compazine Other (See Comments)    "eyes get buggy"    Outpatient Encounter Medications as of 03/08/2019  Medication Sig  . atorvastatin (LIPITOR) 20 MG tablet Take 20 mg by mouth daily.   . bisacodyl (DULCOLAX) 10 MG suppository Constipation (2 of 4): If not relieved by MOM, give 10 mg Bisacodyl suppositiory rectally X 1 dose in  24 hours as needed (Do not use constipation standing orders for residents with renal failure/CFR less than 30. Contact MD for orders)  . cholecalciferol (VITAMIN D) 1000 units tablet Take 1,000 Units by mouth daily.  . clonazePAM (KLONOPIN) 0.25 MG disintegrating tablet Take 1 tablet (0.25 mg total) by mouth 2 (two) times daily for 60 doses.  Marland Kitchen denosumab (PROLIA) 60 MG/ML SOLN injection Inject 60 mg into the skin every 6 (six) months. Administer in upper arm, thigh, or abdomen  . escitalopram (LEXAPRO) 10 MG tablet Take 1 tablet (10 mg total) by mouth daily.  Marland Kitchen linaclotide (LINZESS) 145 MCG CAPS capsule TAKE ONE CAPSULE BY MOUTH DAILY BEFORE BREAKFAST  . loratadine (CLARITIN) 10 MG tablet Take 10 mg by mouth daily.  . Menthol, Topical Analgesic, (BIOFREEZE) 4 % GEL Apply 1 application topically 2 (two) times daily. Apply topically BID to sore areas - back  . mirabegron ER (MYRBETRIQ) 50 MG TB24 tablet Take 50 mg by mouth daily.  . polyethylene glycol (MIRALAX / GLYCOLAX) packet Take 17 g by mouth daily as needed for mild constipation or moderate constipation.  . polyvinyl alcohol (LIQUIFILM TEARS) 1.4 % ophthalmic solution Place 1 drop into both eyes 4 (four) times daily.   . psyllium (METAMUCIL) 58.6 % packet Take 1 packet by mouth every Monday, Wednesday, and Friday.  . senna-docusate (SENOKOT-S) 8.6-50 MG tablet Take 2 tablets by mouth at bedtime.  . Sodium Phosphates (RA SALINE ENEMA RE) Constipation (3 of 4): If not relieved by Biscodyl suppository, give disposable Saline Enema rectally X 1 dose/24 hrs as needed (Do not use constipation standing orders for residents with renal failure/CFR less than 30. Contact MD for orders)  . White Petrolatum-Mineral Oil (LUBRIFRESH P.M. OP) Apply 1 application to eye at bedtime. Apply to right eye  . [DISCONTINUED] clonazePAM (KLONOPIN) 0.25 MG disintegrating tablet Take 1 tablet (0.25 mg total) by mouth daily as needed for up to 14 days for seizure.   No  facility-administered encounter medications on file as of 03/08/2019.    Of note clonazepam 0.125 mg once a day as needed has been added as well to her routine clonazepam which is 0.25 mg twice daily Review of Systems General she not complaining of any fever or chills.  Skin does not complain of rashes or itching.  Head ears eyes nose mouth and throat does have chronic upper torso tremors but does not complain of visual changes or sore throat.  Respiratory does not complain of a cough or shortness of breath.  Cardiac is not complaining of chest pain or edema.  GI does not complain of abdominal pain nausea vomiting diarrhea constipation she does have some history of constipation in the past and actually has been evaluated by GI but appears to be doing well with the [place] and MiraLAX.  GU does not complain of dysuria.  Musculoskeletal does have a history of weakness with atrophy but does not  complain of pain currently.  Neurologic does that have a history of tremors most prominently of her torso and upper extremities does not complain of a headache or feeling syncopal or having numbness.  And psych does not complain of being depressed or anxious appears to have adapted to her condition fairly well participates in activities Immunization History  Administered Date(s) Administered  . Influenza Split 08/11/2012  . Influenza,inj,Quad PF,6+ Mos 05/14/2013, 07/19/2014, 06/29/2015, 07/01/2016  . Influenza-Unspecified 06/09/2017, 07/07/2018  . PPD Test 01/23/2018  . Pneumococcal Polysaccharide-23 11/23/2011  . Tdap 01/23/2018   Pertinent  Health Maintenance Due  Topic Date Due  . PAP SMEAR-Modifier  03/12/2019 (Originally 12/02/1978)  . COLONOSCOPY  03/12/2019 (Originally 07/27/2014)  . INFLUENZA VACCINE  04/10/2019  . MAMMOGRAM  04/13/2020   Fall Risk  10/13/2018 03/31/2018 01/22/2018 11/24/2017 06/29/2015  Falls in the past year? 1 Yes Yes Yes Yes  Number falls in past yr: 0 2 or more 1 2  or more 2 or more  Injury with Fall? 0 Yes Yes Yes Yes  Comment - - - - bruising   Risk Factor Category  - High Fall Risk - High Fall Risk High Fall Risk  Risk for fall due to : - - - - Impaired balance/gait;Impaired mobility;History of fall(s)  Follow up Falls evaluation completed - - Falls evaluation completed -   Functional Status Survey:    Vitals:   03/08/19 1637  BP: (!) 88/57  Pulse: 100  Resp: 18  Temp: (!) 96.8 F (36 C)  TempSrc: Oral  SpO2: 93%  Weight: 87 lb 12.8 oz (39.8 kg)  Height: 4\' 11"  (1.499 m)   Body mass index is 17.73 kg/m. Physical Exam General this is a pleasant female in no distress sitting comfortably in her wheelchair she is bright and alert.  Her skin is warm and dry.  Eyes visual acuity appears grossly intact sclera and conjunctive are clear.  Oropharynx is clear mucous membranes moist.  Chest is clear to auscultation she has somewhat shallow air entry with kyphosis but there is no labored breathing.  Heart is regular rate and rhythm it is in the 80s on exam without murmur gallop or rub she does not have significant lower extremity edema.  Her abdomen is soft nontender with active bowel sounds.  Musculoskeletal continues with diffuse muscle atrophy and general atrophy but moves her extremities at baseline.  Neurologic she has generalized tremors most prominently of her upper extremities and torso and head- she feels she does benefit from the clonazepam.  Psych she continues to be pleasant and appropriate   Labs reviewed:  December 04, 2018.  WBC 4.8 hemoglobin 12.0 platelets 167.  November 09, 2018.  Sodium 140 potassium 4 BUN 27.2 creatinine 0.44.   Recent Labs    11/09/18  NA 140  K 4.0  CL 105  CO2 26  BUN 27*  CREATININE 0.4*  CALCIUM 8.6   No results for input(s): AST, ALT, ALKPHOS, BILITOT, PROT, ALBUMIN in the last 8760 hours. Recent Labs    09/11/18 11/09/18 12/02/18 12/04/18  WBC 5.4 5.7 4.8  --   NEUTROABS 3  --    --   --   HGB 11.6* 11.0* 12.0  --   HCT 35* 33* 36  --   PLT 170 190  --  167   Lab Results  Component Value Date   TSH 1.15 03/02/2015   Lab Results  Component Value Date   HGBA1C 5.5 10/11/2011   Lab  Results  Component Value Date   CHOL 150 12/12/2017   HDL 38 12/12/2017   LDLCALC 101 12/12/2017   LDLDIRECT 165.0 05/14/2013   TRIG 54 12/12/2017   CHOLHDL 4 11/06/2015    Significant Diagnostic Results in last 30 days:  No results found.  Assessment/Plan  #1 history of cerebellar tremor with diagnosis of neuromuscular disorder-on clonazepam 0.25 mg twice daily routinely and does have a as needed order short-term for 0.125 once a day as needed. This appears to be helping at this point will continue current medications-I suspect any de-escalation of this word resulting in increased tremors and discomfort for the patient  .  2.  Hypotension she appears to be asymptomatic as noted above would like to get a cortisol level when coronavirus quarantine eases.  Recent blood pressures have shown readings from the low 41Q systolically up to around 112- baseline appears to be in the 90s- to  around 100.  At this point will monitor.  Her pulses appear to range from largely 58 up to 100 I got 80s on exam this evening.  She appears clinically stable in this regards.  3 history of depression with anxiety this appears to be pretty well controlled she is on clonazepam for this as well and low-dose Lexapro.  [id number] of note greater than 25 minutes spent assessing patient discussing her status with nursing staff reviewing her chart and labs and coordinating a plan of care--of note greater than 50% of time spent coordinating plan of care with input as noted above

## 2019-03-17 ENCOUNTER — Encounter: Payer: Self-pay | Admitting: Internal Medicine

## 2019-03-17 ENCOUNTER — Non-Acute Institutional Stay (SKILLED_NURSING_FACILITY): Payer: Medicare Other | Admitting: Internal Medicine

## 2019-03-17 DIAGNOSIS — I5032 Chronic diastolic (congestive) heart failure: Secondary | ICD-10-CM

## 2019-03-17 DIAGNOSIS — D649 Anemia, unspecified: Secondary | ICD-10-CM | POA: Diagnosis not present

## 2019-03-17 DIAGNOSIS — J029 Acute pharyngitis, unspecified: Secondary | ICD-10-CM

## 2019-03-17 NOTE — Assessment & Plan Note (Addendum)
She describes occasional shortness of breath without paroxysmal nocturnal dyspnea.  She does not have any peripheral edema.CHF compensated.

## 2019-03-17 NOTE — Progress Notes (Signed)
NURSING HOME LOCATION:  [facility] ROOM NUMBER:  101-B  CODE STATUS:  Full Code  PCP:  Hendricks Limes, MD  [place] [address]   This is a nursing facility follow up of chronic medical diagnoses.   Interim medical record and care since last [facility] visit was updated with review of diagnostic studies and change in clinical status since last visit were documented.  HPI: She is a permanent resident of facility with neuromuscular disorder in the context of a history of astrocytoma of the brain treated in 1971 with radiation.  This has left her with neuromuscular deficits and wheelchair bound.  She has a history of minimally invasive annuloplasty for severe mitral regurgitation in 2013.  Other diagnoses include osteoporosis, vitamin D deficiency, dyslipidemia, and depression. In addition to the mitral valve procedure she has had ventriculoperitoneal shunting and right knee arthroscopic surgery. Labs are not current.  She did have a mild anemia which corrected in March of this year. She had been evaluated by the psychiatric nurse practitioner in late April for depression. Colonoscopy was to have been completed in March but canceled because of the coronavirus.  Review of systems: As noted she was not able to have a colonoscopy because of the corona virus.  She denies any active GI symptoms at this time.   She does state that in the last 24 to 48 hours she has had noted "phlegm in throat".  She is also had a slightly scratchy throat.  She has had some sensation of chilling in the mornings.  She denies other upper or lower respiratory tract symptoms except for occasional shortness of breath.   She denies paroxysmal nocturnal dyspnea.  She has no extrinsic symptoms either;she is on loratadine.   She states that occasionally she notices some numbness around the mouth or lips.  She is not on an ACE inhibitor or ARB.  Constitutional: No definite fever,  significant weight change, fatigue  Eyes: No redness, discharge, pain, vision change ENT/mouth: No nasal congestion,  purulent discharge, earache, change in hearing  Cardiovascular: No chest pain, palpitations, edema  Respiratory: No  sputum production, hemoptysis, significant snoring, apnea   Gastrointestinal: No heartburn, dysphagia, abdominal pain, nausea /vomiting, rectal bleeding, melena, change in bowels Genitourinary: No dysuria, hematuria, pyuria, incontinence, nocturia Musculoskeletal: No joint stiffness, joint swelling, weakness, pain Dermatologic: No rash, pruritus, change in appearance of skin Neurologic: No dizziness, headache, syncope, seizures Psychiatric: No significant anxiety, depression, insomnia, anorexia Endocrine: No change in hair/skin/nails, excessive thirst, excessive hunger, excessive urination. "Chilling" as noted Hematologic/lymphatic: No significant bruising, lymphadenopathy, abnormal bleeding Allergy/immunology: No itchy/watery eyes, significant sneezing, urticaria, angioedema despite numbness described  Physical exam:  Pertinent or positive findings: She is petite and appears somewhat frail.  She sits in the wheelchair with visible rocking tremor of her head and trunk.  She exhibits a somewhat staccato character to her voice pattern.  Nares and oropharynx are clear without any exudate or erythema.  She has no palpable lymphadenopathy about the neck.  She tends to keep her head and neck tilted to the right.  Breath sounds are decreased.  There is slight accentuation of S1 and S2.  Initially she initially appears weak to opposition in her extremities but after slight delay the lower extremities appear stronger than the upper extremities.  She does have some atrophy of the gastrocnemius muscles.  She does mobilize in the wheelchair using her feet.  General appearance: no acute distress, increased work  of breathing is present.   Lymphatic: No lymphadenopathy in axilla.  Eyes: No conjunctival inflammation or lid edema is present. There is no scleral icterus. Ears:  External ear exam shows no significant lesions or deformities.   Nose:  External nasal examination shows no deformity or inflammation. Nasal mucosa are pink and moist without lesions Oral exam:  Lips and gums are healthy appearing. There is no oropharyngeal erythema or exudate. Neck:  No thyromegaly, masses, tenderness noted.    Heart:  Normal rate and regular rhythm without gallop, murmur, click, rub .  Lungs:  without wheezes, rhonchi, rales, rubs. Abdomen: Bowel sounds are normal. Abdomen is soft and nontender with no organomegaly, hernias, masses. GU: Deferred  Extremities:  No cyanosis, clubbing, edema  Neurologic exam : Strength equal  in upper & lower extremities Balance, Rhomberg, finger to nose testing could not be completed due to clinical state Skin: Warm & dry w/o tenting. No significant lesions or rash.  See summary under each active problem in the Problem List with associated updated therapeutic plan

## 2019-03-17 NOTE — Assessment & Plan Note (Addendum)
She has no significant GI symptoms at this time.  The mild anemia did resolve in March of this year.  Pursue colonoscopy once the [employer] is lifted.

## 2019-03-17 NOTE — Patient Instructions (Signed)
See assessment and plan under each diagnosis in the problem list and acutely for this visit 

## 2019-03-19 ENCOUNTER — Encounter: Payer: Self-pay | Admitting: Adult Health

## 2019-03-19 ENCOUNTER — Non-Acute Institutional Stay (SKILLED_NURSING_FACILITY): Payer: Medicare Other | Admitting: Adult Health

## 2019-03-19 DIAGNOSIS — Z0000 Encounter for general adult medical examination without abnormal findings: Secondary | ICD-10-CM

## 2019-03-19 NOTE — Progress Notes (Signed)
Subjective:   Michaela [name] is a 63 y.o. female who presents for Medicare Annual (Subsequent) preventive examination.  Review of Systems:   Cardiac Risk Factors include: Other (see comment), Risk factor comments: history of heart valve repair     Objective:     Vitals: BP 99/67   Pulse 83   Temp (!) 97.3 F (36.3 C) (Oral)   Resp 20   Ht 4\' 11"  (1.499 m)   Wt 86 lb 12.8 oz (39.4 kg)   SpO2 97%   BMI 17.53 kg/m   Body mass index is 17.53 kg/m.  Advanced Directives 02/10/2019 01/18/2019 12/24/2018 12/02/2018 11/16/2018 01/22/2018 05/09/2016  Does Patient Have a Medical Advance Directive? Yes Yes Yes Yes Yes No Yes  Type of Advance Directive (No Data) (No Data) (No Data) (No Data) (No Data) - Healthcare Power of Attorney  Does patient want to make changes to medical advance directive? No - Patient declined No - Patient declined No - Patient declined No - Patient declined No - Patient declined - -  Would patient like information on creating a medical advance directive? - - - - - No - Patient declined -  Pre-existing out of facility DNR order (yellow form or pink MOST form) - - - - - - -    Tobacco Social History   Tobacco Use  Smoking Status Former Smoker  . Quit date: 09/09/1990  . Years since quitting: 28.5  Smokeless Tobacco Never Used     Counseling given: Not Answered   Clinical Intake:  Pre-visit preparation completed: No  Pain : No/denies pain     BMI - recorded: (.bmi 17.53) Nutritional Status: BMI <19  Underweight Nutritional Risks: Unintentional weight gain Diabetes: No  How often do you need to have someone help you when you read instructions, pamphlets, or other written materials from your doctor or pharmacy?: 3 - Sometimes What is the last grade level you completed in school?: (2nd year college)  Interpreter Needed?: No  Information entered by :: Seth Bake - NP)  Past Medical History:  Diagnosis Date  . Astrocytoma brain tumor ([place]) 1971    astrocytoma treated with radiation  . C. difficile colitis 04/1828   complicated by sepsis/ARDS and prolonged hosp  . Depression   . Hyperlipidemia   . Mitral regurgitation 10/2011   severe, s/p min invasive repair and annuloplasty  . Mitral valve prolapse   . Neuromuscular disorder (HCC)    numbness in hands and feet  . Osteoarthritis   . Osteoporosis    DEXA @ [facility] 11/2012: -3.5, prior tx Reclast, last in 2011, rec change to Prolia  . Overactive bladder   . Pleural effusion, left 11/11/2011  . Vitamin D deficiency    Past Surgical History:  Procedure Laterality Date  . CARDIAC CATHETERIZATION     Jan 2013  . KNEE ARTHROSCOPY Right   . MITRAL VALVE REPAIR  10/15/2011   Procedure: MINIMALLY INVASIVE MITRAL VALVE REPAIR (MVR);  Surgeon: Rexene Alberts, MD;  Location: [facility];  Service: Open Heart Surgery;  Laterality: Right;  . TRANSESOPHAGEAL ECHOCARDIOGRAM  7/12  . US ECHOCARDIOGRAPHY  6/12  . VENTRICULOPERITONEAL SHUNT  1971   Family History  Problem Relation Age of Onset  . Coronary artery disease Father 3       died MI  . Hyperlipidemia Mother   . Heart attack Maternal Grandfather    Social History   Socioeconomic History  . Marital status: Single    Spouse  name: Not on file  . Number of children: 0  . Years of education: Not on file  . Highest education level: Not on file  Occupational History  . Occupation: disabled  Social Needs  . Financial resource strain: Not hard at all  . Food insecurity    Worry: Never true    Inability: Never true  . Transportation needs    Medical: No    Non-medical: No  Tobacco Use  . Smoking status: Former Smoker    Quit date: 09/09/1990    Years since quitting: 28.5  . Smokeless tobacco: Never Used  Substance and Sexual Activity  . Alcohol use: Not Currently    Alcohol/week: 0.0 standard drinks    Comment: rarely  . Drug use: No  . Sexual activity: Not on file  Lifestyle  . Physical activity    Days per week: 0 days     Minutes per session: 0 min  . Stress: Only a little  Relationships  . Social Herbalist on phone: Three times a week    Gets together: Three times a week    Attends religious service: Never    Active member of club or organization: Not on file    Attends meetings of clubs or organizations: Never    Relationship status: Never married  Other Topics Concern  . Not on file  Social History Narrative   [facility] in [facility].      Patient is right-handed. She is a permanent resident of [facility].      Outpatient Encounter Medications as of 03/19/2019  Medication Sig  . atorvastatin (LIPITOR) 20 MG tablet Take 20 mg by mouth daily.   . bisacodyl (DULCOLAX) 10 MG suppository Constipation (2 of 4): If not relieved by MOM, give 10 mg Bisacodyl suppositiory rectally X 1 dose in 24 hours as needed (Do not use constipation standing orders for residents with renal failure/CFR less than 30. Contact MD for orders)  . cholecalciferol (VITAMIN D) 1000 units tablet Take 1,000 Units by mouth daily.  . clonazePAM (KLONOPIN) 0.25 MG disintegrating tablet Take 1 tablet (0.25 mg total) by mouth 2 (two) times daily for 60 doses.  Marland Kitchen denosumab (PROLIA) 60 MG/ML SOLN injection Inject 60 mg into the skin every 6 (six) months. Administer in upper arm, thigh, or abdomen  . escitalopram (LEXAPRO) 10 MG tablet Take 1 tablet (10 mg total) by mouth daily.  Marland Kitchen linaclotide (LINZESS) 145 MCG CAPS capsule TAKE ONE CAPSULE BY MOUTH DAILY BEFORE BREAKFAST  . loratadine (CLARITIN) 10 MG tablet Take 10 mg by mouth daily.  . Menthol, Topical Analgesic, (BIOFREEZE) 4 % GEL Apply 1 application topically 2 (two) times daily. Apply topically BID to sore areas - back  . mirabegron ER (MYRBETRIQ) 50 MG TB24 tablet Take 50 mg by mouth daily.  . polyethylene glycol (MIRALAX / GLYCOLAX) packet Take 17 g by mouth daily as needed for mild constipation or moderate constipation.  .  polyvinyl alcohol (LIQUIFILM TEARS) 1.4 % ophthalmic solution Place 1 drop into both eyes 4 (four) times daily.   . psyllium (METAMUCIL) 58.6 % packet Take 1 packet by mouth every Monday, Wednesday, and Friday.  . senna-docusate (SENOKOT-S) 8.6-50 MG tablet Take 2 tablets by mouth at bedtime.  . Sodium Phosphates (RA SALINE ENEMA RE) Constipation (3 of 4): If not relieved by Biscodyl suppository, give disposable Saline Enema rectally X 1 dose/24 hrs as needed (Do not use constipation  standing orders for residents with renal failure/CFR less than 30. Contact MD for orders)  . White Petrolatum-Mineral Oil (LUBRIFRESH P.M. OP) Apply 1 application to eye at bedtime. Apply to right eye   No facility-administered encounter medications on file as of 03/19/2019.     Activities of Daily Living In your present state of health, do you have any difficulty performing the following activities: 03/19/2019  Hearing? Y  Vision? N  Difficulty concentrating or making decisions? N  Walking or climbing stairs? Y  Dressing or bathing? Y  Doing errands, shopping? Y  Preparing Food and eating ? Y  Using the Toilet? N  In the past six months, have you accidently leaked urine? Y  Do you have problems with loss of bowel control? N  Managing your Medications? Y  Managing your Finances? Y  Housekeeping or managing your Housekeeping? Y  Some recent data might be hidden    Patient Care Team: Hendricks Limes, MD as PCP - General (Internal Medicine) Larey Dresser, MD as Referring Physician (Cardiology) Larey Dresser, MD (Cardiology) Medina-Vargas, Senaida Lange, NP as Nurse Practitioner (Internal Medicine) Pieter Partridge, DO as Consulting Physician (Neurology)    Assessment:   This is a routine wellness examination for [place].  Exercise Activities and Dietary recommendations Current Exercise Habits: Structured exercise class, Type of exercise: stretching(standing and pegs), Time (Minutes): 10, Frequency  (Times/Week): 1, Weekly Exercise (Minutes/Week): 10, Intensity: Mild, Exercise limited by: neurologic condition(s)  Goals    . DIET - INCREASE LEAN PROTEINS    . DIET - INCREASE WATER INTAKE    . Plan meals       Fall Risk Fall Risk  03/19/2019 10/13/2018 03/31/2018 01/22/2018 11/24/2017  Falls in the past year? 1 1 Yes Yes Yes  Number falls in past yr: 1 0 2 or more 1 2 or more  Injury with Fall? 1 0 Yes Yes Yes  Comment - - - - -  Risk Factor Category  - - High Fall Risk - High Fall Risk  Risk for fall due to : History of fall(s);Impaired balance/gait;Impaired mobility;Medication side effect - - - -  Follow up Falls evaluation completed;Education provided;Falls prevention discussed Falls evaluation completed - - Falls evaluation completed   Is the patient's home free of loose throw rugs in walkways, pet beds, electrical cords, etc?   yes      Grab bars in the bathroom? yes      Handrails on the stairs?   No stairs      Adequate lighting?   yes  Timed Get Up and Go performed: No  Depression Screen PHQ 2/9 Scores 03/19/2019 01/22/2018 11/24/2017 11/11/2012  PHQ - 2 Score 0 0 0 0     Cognitive Function MMSE - Mini Mental State Exam 03/19/2019  Orientation to time 5  Orientation to Place 5  Registration 3  Attention/ Calculation 5  Recall 3  Language- name 2 objects 2  Language- repeat 1  Language- follow 3 step command 3  Language- read & follow direction 1  Write a sentence 1  Copy design 1  Total score 30     6CIT Screen 03/19/2019 01/22/2018  What Year? 4 points 0 points  What month? 3 points 0 points  What time? 3 points 0 points  Count back from 20 0 points 0 points  Months in reverse 4 points 4 points  Repeat phrase 10 points 2 points  Total Score 24 6    Immunization History  Administered Date(s) Administered  . Influenza Split 08/11/2012  . Influenza,inj,Quad PF,6+ Mos 05/14/2013, 07/19/2014, 06/29/2015, 07/01/2016  . Influenza-Unspecified 06/09/2017, 07/07/2018   . PPD Test 01/23/2018  . Pneumococcal Polysaccharide-23 11/23/2011  . Tdap 01/23/2018    Qualifies for Shingles Vaccine?No  Screening Tests Health Maintenance  Topic Date Due  . PAP SMEAR-Modifier  04/19/2019 (Originally 12/02/1978)  . COLONOSCOPY  04/19/2019 (Originally 07/27/2014)  . INFLUENZA VACCINE  04/10/2019  . MAMMOGRAM  04/13/2020  . TETANUS/TDAP  01/24/2028  . Hepatitis C Screening  Discontinued  . HIV Screening  Discontinued    Cancer Screenings: Lung: Low Dose CT Chest recommended if Age 39-80 years, 30 pack-year currently smoking OR have quit w/in 15years. Patient does not qualify. Breast:  Up to date on Mammogram? No   Up to date of Bone Density/Dexa? No Colorectal: Stool occult blood ordered  Additional Screenings:  Hepatitis C Screening:  Ordered     Plan:  - continue current medications and plan of care  I have personally reviewed and noted the following in the patient's chart:   . Medical and social history . Use of alcohol, tobacco or illicit drugs  . Current medications and supplements . Functional ability and status . Nutritional status . Physical activity . Advanced directives . List of other physicians . Hospitalizations, surgeries, and ER visits in previous 12 months . Vitals . Screenings to include cognitive, depression, and falls . Referrals and appointments  In addition, I have reviewed and discussed with patient certain preventive protocols, quality metrics, and best practice recommendations. A written personalized care plan for preventive services as well as general preventive health recommendations were provided to patient.      Medina-Vargas, NP  03/19/2019

## 2019-03-19 NOTE — Progress Notes (Signed)
This encounter was created in error - please disregard.

## 2019-03-19 NOTE — Progress Notes (Deleted)
Location:  [place] Room Number: 101/B Place of Service:  SNF (31) Provider:  Durenda Age, DNP, FNP-BC  Patient Care Team: Hendricks Limes, MD as PCP - General (Internal Medicine) Larey Dresser, MD as Referring Physician (Cardiology) Larey Dresser, MD (Cardiology) Medina-Vargas, Senaida Lange, NP as Nurse Practitioner (Internal Medicine) Pieter Partridge, DO as Consulting Physician (Neurology)  Extended Emergency Contact Information Primary Emergency Contact: [name], [place] [address] [place] Phone: [phone] Relation: Sister  Code Status:  Full Code  Goals of care: Advanced Directive information Advanced Directives 02/10/2019  Does Patient Have a Medical Advance Directive? Yes  Type of Advance Directive (No Data)  Does patient want to make changes to medical advance directive? No - Patient declined  Would patient like information on creating a medical advance directive? -  Pre-existing out of facility DNR order (yellow form or pink MOST form) -     Chief Complaint  Patient presents with  . Medicare Wellness    Annual Wellness Visit    HPI:  [name] seen today for medical management of chronic diseases.     Past Medical History:  Diagnosis Date  . Astrocytoma brain tumor ([place]) 1971   astrocytoma treated with radiation  . C. difficile colitis 12/6960   complicated by sepsis/ARDS and prolonged hosp  . Depression   . Hyperlipidemia   . Mitral regurgitation 10/2011   severe, s/p min invasive repair and annuloplasty  . Mitral valve prolapse   . Neuromuscular disorder (HCC)    numbness in hands and feet  . Osteoarthritis   . Osteoporosis    DEXA @ [facility] 11/2012: -3.5, prior tx Reclast, last in 2011, rec change to Prolia  . Overactive bladder   . Pleural effusion, left 11/11/2011  . Vitamin D deficiency    Past Surgical History:  Procedure Laterality Date  . CARDIAC CATHETERIZATION      Jan 2013  . KNEE ARTHROSCOPY Right   . MITRAL VALVE REPAIR  10/15/2011   Procedure: MINIMALLY INVASIVE MITRAL VALVE REPAIR (MVR);  Surgeon: Rexene Alberts, MD;  Location: [facility];  Service: Open Heart Surgery;  Laterality: Right;  . TRANSESOPHAGEAL ECHOCARDIOGRAM  7/12  . US ECHOCARDIOGRAPHY  6/12  . VENTRICULOPERITONEAL SHUNT  1971    Allergies  Allergen Reactions  . Compazine Other (See Comments)    "eyes get buggy"    Outpatient Encounter Medications as of 03/19/2019  Medication Sig  . atorvastatin (LIPITOR) 20 MG tablet Take 20 mg by mouth daily.   . bisacodyl (DULCOLAX) 10 MG suppository Constipation (2 of 4): If not relieved by MOM, give 10 mg Bisacodyl suppositiory rectally X 1 dose in 24 hours as needed (Do not use constipation standing orders for residents with renal failure/CFR less than 30. Contact MD for orders)  . cholecalciferol (VITAMIN D) 1000 units tablet Take 1,000 Units by mouth daily.  . clonazePAM (KLONOPIN) 0.25 MG disintegrating tablet Take 1 tablet (0.25 mg total) by mouth 2 (two) times daily for 60 doses.  Marland Kitchen denosumab (PROLIA) 60 MG/ML SOLN injection Inject 60 mg into the skin every 6 (six) months. Administer in upper arm, thigh, or abdomen  . escitalopram (LEXAPRO) 10 MG tablet Take 1 tablet (10 mg total) by mouth daily.  Marland Kitchen linaclotide (LINZESS) 145 MCG CAPS capsule TAKE ONE CAPSULE BY MOUTH DAILY BEFORE BREAKFAST  . loratadine (CLARITIN) 10 MG tablet  Take 10 mg by mouth daily.  . Menthol, Topical Analgesic, (BIOFREEZE) 4 % GEL Apply 1 application topically 2 (two) times daily. Apply topically BID to sore areas - back  . mirabegron ER (MYRBETRIQ) 50 MG TB24 tablet Take 50 mg by mouth daily.  . polyethylene glycol (MIRALAX / GLYCOLAX) packet Take 17 g by mouth daily as needed for mild constipation or moderate constipation.  . polyvinyl alcohol (LIQUIFILM TEARS) 1.4 % ophthalmic solution Place 1 drop into both eyes 4 (four) times daily.   . psyllium (METAMUCIL) 58.6  % packet Take 1 packet by mouth every Monday, Wednesday, and Friday.  . senna-docusate (SENOKOT-S) 8.6-50 MG tablet Take 2 tablets by mouth at bedtime.  . Sodium Phosphates (RA SALINE ENEMA RE) Constipation (3 of 4): If not relieved by Biscodyl suppository, give disposable Saline Enema rectally X 1 dose/24 hrs as needed (Do not use constipation standing orders for residents with renal failure/CFR less than 30. Contact MD for orders)  . White Petrolatum-Mineral Oil (LUBRIFRESH P.M. OP) Apply 1 application to eye at bedtime. Apply to right eye   No facility-administered encounter medications on file as of 03/19/2019.     Review of Systems  GENERAL: No change in appetite, no fatigue, no weight changes, no fever, chills or weakness SKIN: Denies rash, itching, wounds, ulcer sores, or nail abnormalities EYES: Denies change in vision, dry eyes, eye pain, itching or discharge EARS: Denies change in hearing, ringing in ears, or earache NOSE: Denies nasal congestion or epistaxis MOUTH and THROAT: Denies oral discomfort, gingival pain or bleeding, pain from teeth or hoarseness   RESPIRATORY: no cough, SOB, DOE, wheezing, hemoptysis CARDIAC: No chest pain, edema or palpitations GI: No abdominal pain, diarrhea, constipation, heart burn, nausea or vomiting GU: Denies dysuria, frequency, hematuria, incontinence, or discharge MUSCULOSKELETAL: Denies joint pain, muscle pain, back pain, restricted movement, or unusual weakness CIRCULATION: Denies claudication, edema of legs, varicosities, or cold extremities NEUROLOGICAL: Denies dizziness, syncope, numbness, or headache PSYCHIATRIC: Denies feelings of depression or anxiety. No report of hallucinations, insomnia, paranoia, or agitation ENDOCRINE: Denies polyphagia, polyuria, polydipsia, heat or cold intolerance HEME/LYMPH: Denies excessive bruising, petechia, enlarged lymph nodes, or bleeding problems IMMUNOLOGIC: Denies history of frequent infections, AIDS,  or use of immunosuppressive agents   Immunization History  Administered Date(s) Administered  . Influenza Split 08/11/2012  . Influenza,inj,Quad PF,6+ Mos 05/14/2013, 07/19/2014, 06/29/2015, 07/01/2016  . Influenza-Unspecified 06/09/2017, 07/07/2018  . PPD Test 01/23/2018  . Pneumococcal Polysaccharide-23 11/23/2011  . Tdap 01/23/2018   Pertinent  Health Maintenance Due  Topic Date Due  . PAP SMEAR-Modifier  12/02/1978  . COLONOSCOPY  07/27/2014  . INFLUENZA VACCINE  04/10/2019  . MAMMOGRAM  04/13/2020   Fall Risk  10/13/2018 03/31/2018 01/22/2018 11/24/2017 06/29/2015  Falls in the past year? 1 Yes Yes Yes Yes  Number falls in past yr: 0 2 or more 1 2 or more 2 or more  Injury with Fall? 0 Yes Yes Yes Yes  Comment - - - - bruising   Risk Factor Category  - High Fall Risk - High Fall Risk High Fall Risk  Risk for fall due to : - - - - Impaired balance/gait;Impaired mobility;History of fall(s)  Follow up Falls evaluation completed - - Falls evaluation completed -     Vitals:   03/19/19 1154  BP: 99/67  Pulse: 83  Resp: 20  Temp: (!) 97.3 F (36.3 C)  TempSrc: Oral  SpO2: 97%  Weight: 86 lb 12.8 oz (39.4 kg)  Height: 4\' 11"  (1.499 m)   Body mass index is 17.53 kg/m.  Physical Exam  GENERAL APPEARANCE: Well nourished. In no acute distress. Normal body habitus SKIN:  Skin is warm and dry. There are no suspicious lesions or rash HEAD: Normal in size and contour. No evidence of trauma EYES: Lids open and close normally. No blepharitis, entropion or ectropion. PERRL. Conjunctivae are clear and sclerae are white. Lenses are without opacity EARS: Pinnae are normal. Patient hears normal voice tunes of the examiner MOUTH and THROAT: Lips are without lesions. Oral mucosa is moist and without lesions. Tongue is normal in shape, size, and color and without lesions NECK: supple, trachea midline, no neck masses, no thyroid tenderness, no thyromegaly LYMPHATICS: No LAN in the neck, no  supraclavicular LAN RESPIRATORY: Breathing is even & unlabored, BS CTAB CARDIAC: RRR, no murmur,no extra heart sounds, no edema GI: Abdomen soft, normal BS, no masses, no tenderness, no hepatomegaly, no splenomegaly MUSCULOSKELETAL: No deformities. Movement at each extremity is full and painless. Strength is 5/5 at each extremity. Back is without kyphosis or scoliosis CIRCULATION: Pedal pulses are 2+. There is no edema of the legs, ankles and feet NEUROLOGICAL: There is no tremor. Speech is clear PSYCHIATRIC: Alert and oriented X 3. Affect and behavior are appropriate  Labs reviewed: Recent Labs    11/09/18  NA 140  K 4.0  CL 105  CO2 26  BUN 27*  CREATININE 0.4*  CALCIUM 8.6   Recent Labs    01/27/19  ALKPHOS 77   Recent Labs    09/11/18 11/09/18 12/02/18 12/04/18  WBC 5.4 5.7 4.8  --   NEUTROABS 3  --   --   --   HGB 11.6* 11.0* 12.0  --   HCT 35* 33* 36  --   PLT 170 190  --  167   Lab Results  Component Value Date   TSH 1.15 03/02/2015   Lab Results  Component Value Date   HGBA1C 5.5 10/11/2011   Lab Results  Component Value Date   CHOL 135 01/27/2019   HDL 42 01/27/2019   LDLCALC 85 01/27/2019   LDLDIRECT 165.0 05/14/2013   TRIG 41 01/27/2019   CHOLHDL 4 11/06/2015    Significant Diagnostic Results in last 30 days:  No results found.  Assessment/Plan    Family/ staff Communication:   Labs/tests ordered:    Goals of care:      Durenda Age, DNP, FNP-BC [facility] [phone] (Monday-Friday 8:00 a.m. - 5:00 p.m.) [phone] (after hours)

## 2019-03-22 DIAGNOSIS — I4891 Unspecified atrial fibrillation: Secondary | ICD-10-CM | POA: Diagnosis not present

## 2019-03-25 LAB — HM HEPATITIS C SCREENING LAB: HM Hepatitis Screen: NEGATIVE

## 2019-04-02 DIAGNOSIS — B342 Coronavirus infection, unspecified: Secondary | ICD-10-CM | POA: Diagnosis not present

## 2019-04-07 ENCOUNTER — Encounter: Payer: Self-pay | Admitting: Adult Health

## 2019-04-07 ENCOUNTER — Non-Acute Institutional Stay (SKILLED_NURSING_FACILITY): Payer: Medicare Other | Admitting: Adult Health

## 2019-04-07 DIAGNOSIS — F339 Major depressive disorder, recurrent, unspecified: Secondary | ICD-10-CM | POA: Diagnosis not present

## 2019-04-07 DIAGNOSIS — K5909 Other constipation: Secondary | ICD-10-CM | POA: Diagnosis not present

## 2019-04-07 DIAGNOSIS — C719 Malignant neoplasm of brain, unspecified: Secondary | ICD-10-CM | POA: Diagnosis not present

## 2019-04-07 DIAGNOSIS — N3281 Overactive bladder: Secondary | ICD-10-CM | POA: Diagnosis not present

## 2019-04-07 NOTE — Progress Notes (Signed)
Location:  [facility] Room Number: 101/B Place of Service:  SNF (31) Provider:  Durenda Age, DNP, FNP-BC  Patient Care Team: Hendricks Limes, MD as PCP - General (Internal Medicine) Larey Dresser, MD as Referring Physician (Cardiology) Larey Dresser, MD (Cardiology) Medina-Vargas, Senaida Lange, NP as Nurse Practitioner (Internal Medicine) Pieter Partridge, DO as Consulting Physician (Neurology)  Extended Emergency Contact Information Primary Emergency Contact: [name], [place] [address] [place] Phone: [phone] Relation: Sister  Code Status: Full Code    Goals of care: Advanced Directive information Advanced Directives 04/07/2019  Does Patient Have a Medical Advance Directive? Yes  Type of Advance Directive (No Data)  Does patient want to make changes to medical advance directive? No - Patient declined  Would patient like information on creating a medical advance directive? -  Pre-existing out of facility DNR order (yellow form or pink MOST form) -     Chief Complaint  Patient presents with  . Medical Management of Chronic Issues    Routine Visit    HPI:  Michaela [name] is a 63 y.o. female seen today for medical management of chronic diseases.  She has PMH of astrocytoma, mitral regurgitation, mitral valve prolapse, osteoarthritis and osteoporosis. She participates in restorative therapy, individual and group exercises, working on AROM BLE. She complains of being constipated, moves he bowels only 2X/week. She currently take Linzess and Metamucil daily. She reported sleeping well at night at around 7:30 PM and wakes up at 8 AM. During this quarantine time due to [place] pandemic, her sister cannot visit personally bu calls her everyday. She was noted to have tremors on the upper part of her body and takes Clonazepam.    Past Medical History:  Diagnosis Date  . 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 C. difficile colitis 02/1442   complicated by sepsis/ARDS and prolonged hosp  . Depression   . Hyperlipidemia   . Mitral regurgitation 10/2011   severe, s/p min invasive repair and annuloplasty  . Mitral valve prolapse   . Neuromuscular disorder (HCC)    numbness in hands and feet  . Osteoarthritis   . Osteoporosis    DEXA @ [facility] 11/2012: -3.5, prior tx Reclast, last in 2011, rec change to Prolia  . Overactive bladder   . Pleural effusion, left 11/11/2011  . Vitamin D deficiency    Past Surgical History:  Procedure Laterality Date  . CARDIAC CATHETERIZATION     Jan 2013  . KNEE ARTHROSCOPY Right   . MITRAL VALVE REPAIR  10/15/2011   Procedure: MINIMALLY INVASIVE MITRAL VALVE REPAIR (MVR);  Surgeon: Rexene Alberts, MD;  Location: [facility];  Service: Open Heart Surgery;  Laterality: Right;  . TRANSESOPHAGEAL ECHOCARDIOGRAM  7/12  . US ECHOCARDIOGRAPHY  6/12  . VENTRICULOPERITONEAL SHUNT  1971    Allergies  Allergen Reactions  . Compazine Other (See Comments)    "eyes get buggy"    Outpatient Encounter Medications as of 04/07/2019  Medication Sig  . atorvastatin (LIPITOR) 20 MG tablet Take 20 mg by mouth daily.   . bisacodyl (DULCOLAX) 10 MG suppository Constipation (2 of 4): If not relieved by MOM, give 10 mg Bisacodyl suppositiory rectally X 1 dose in 24 hours as needed (Do not use constipation standing orders for residents with renal failure/CFR less than 30. Contact MD for orders)  . cholecalciferol (VITAMIN D) 1000  units tablet Take 1,000 Units by mouth daily.  . clonazePAM (KLONOPIN) 0.25 MG disintegrating tablet Take 1 tablet (0.25 mg total) by mouth 2 (two) times daily for 60 doses.  Marland Kitchen denosumab (PROLIA) 60 MG/ML SOLN injection Inject 60 mg into the skin every 6 (six) months. Administer in upper arm, thigh, or abdomen  . escitalopram (LEXAPRO) 10 MG tablet Take 1 tablet (10 mg total) by mouth daily.  Marland Kitchen linaclotide (LINZESS) 145 MCG CAPS capsule TAKE ONE CAPSULE  BY MOUTH DAILY BEFORE BREAKFAST  . loratadine (CLARITIN) 10 MG tablet Take 10 mg by mouth daily.  . Menthol, Topical Analgesic, (BIOFREEZE) 4 % GEL Apply 1 application topically 2 (two) times daily. Apply topically BID to sore areas - back  . mirabegron ER (MYRBETRIQ) 50 MG TB24 tablet Take 50 mg by mouth daily.  . polyvinyl alcohol (LIQUIFILM TEARS) 1.4 % ophthalmic solution Place 1 drop into both eyes 4 (four) times daily.   . psyllium (METAMUCIL) 58.6 % packet Take 1 packet by mouth every Monday, Wednesday, and Friday.  . Sodium Phosphates (RA SALINE ENEMA RE) Constipation (3 of 4): If not relieved by Biscodyl suppository, give disposable Saline Enema rectally X 1 dose/24 hrs as needed (Do not use constipation standing orders for residents with renal failure/CFR less than 30. Contact MD for orders)  . White Petrolatum-Mineral Oil (LUBRIFRESH P.M. OP) Apply 1 application to eye at bedtime. Apply to right eye  . [DISCONTINUED] polyethylene glycol (MIRALAX / GLYCOLAX) packet Take 17 g by mouth daily as needed for mild constipation or moderate constipation.  . [DISCONTINUED] senna-docusate (SENOKOT-S) 8.6-50 MG tablet Take 2 tablets by mouth at bedtime.   No facility-administered encounter medications on file as of 04/07/2019.     Review of Systems  GENERAL: No change in appetite, no fatigue, no weight changes, no fever, chills or weakness MOUTH and THROAT: Denies oral discomfort, gingival pain or bleeding, pain from teeth or hoarseness   RESPIRATORY: no cough, SOB, DOE, wheezing, hemoptysis CARDIAC: No chest pain, edema or palpitations GI: +constipation NEUROLOGICAL: Denies dizziness, syncope, numbness, or headache PSYCHIATRIC: Denies feelings of depression or anxiety. No report of hallucinations, insomnia, paranoia, or agitation    Immunization History  Administered Date(s) Administered  . Influenza Split 08/11/2012  . Influenza,inj,Quad PF,6+ Mos 05/14/2013, 07/19/2014, 06/29/2015,  07/01/2016  . Influenza-Unspecified 06/09/2017, 07/07/2018  . PPD Test 01/23/2018  . Pneumococcal Polysaccharide-23 11/23/2011  . Tdap 01/23/2018   Pertinent  Health Maintenance Due  Topic Date Due  . PAP SMEAR-Modifier  04/19/2019 (Originally 12/02/1978)  . COLONOSCOPY  04/19/2019 (Originally 07/27/2014)  . INFLUENZA VACCINE  04/10/2019  . MAMMOGRAM  04/13/2020   Fall Risk  03/19/2019 10/13/2018 03/31/2018 01/22/2018 11/24/2017  Falls in the past year? 1 1 Yes Yes Yes  Number falls in past yr: 1 0 2 or more 1 2 or more  Injury with Fall? 1 0 Yes Yes Yes  Comment - - - - -  Risk Factor Category  - - High Fall Risk - High Fall Risk  Risk for fall due to : History of fall(s);Impaired balance/gait;Impaired mobility;Medication side effect - - - -  Follow up Falls evaluation completed;Education provided;Falls prevention discussed Falls evaluation completed - - Falls evaluation completed     Vitals:   04/07/19 1406  BP: (!) 89/54  Pulse: 86  Resp: 18  Temp: (!) 96.4 F (35.8 C)  TempSrc: Oral  SpO2: 93%  Weight: 86 lb 12.8 oz (39.4 kg)  Height: 4\' 11"  (1.499 m)  Body mass index is 17.53 kg/m.  Physical Exam  GENERAL APPEARANCE:  In no acute distress. SKIN:  Skin is warm and dry.  MOUTH and THROAT: Lips are without lesions. Oral mucosa is moist and without lesions. Tongue is normal in shape, size, and color and without lesions RESPIRATORY: Breathing is even & unlabored, BS CTAB CARDIAC: RRR, no murmur,no extra heart sounds, no edema GI: Abdomen soft, normal BS, no masses, no tenderness EXTREMITIES:  Able to move X 4 extremitie NEUROLOGICAL: + tremor. Speech is clear. Alert and oriented X 3. PSYCHIATRIC:  Affect and behavior are appropriate  Labs reviewed: Recent Labs    11/09/18  NA 140  K 4.0  CL 105  CO2 26  BUN 27*  CREATININE 0.4*  CALCIUM 8.6   Recent Labs    01/27/19  ALKPHOS 77   Recent Labs    09/11/18 11/09/18 12/02/18 12/04/18  WBC 5.4 5.7 4.8  --    NEUTROABS 3  --   --   --   HGB 11.6* 11.0* 12.0  --   HCT 35* 33* 36  --   PLT 170 190  --  167   Lab Results  Component Value Date   TSH 1.15 03/02/2015   Lab Results  Component Value Date   HGBA1C 5.5 10/11/2011   Lab Results  Component Value Date   CHOL 135 01/27/2019   HDL 42 01/27/2019   LDLCALC 85 01/27/2019   LDLDIRECT 165.0 05/14/2013   TRIG 41 01/27/2019   CHOLHDL 4 11/06/2015     Assessment/Plan  1. Brain tumor, astrocytoma ([place]) - follows up with neurology, tremors treated with Clonazepam 0.25  Mg BID  2. Overactive bladder -She claims that she does not get up during the night to urinate, continue Myrbetriq ER 50 mg 1 tab daily  3. Chronic constipation -Will add Colace 100 mg twice a day, continue Linzess 145 mcg 1 capsule daily  4. Recurrent major depressive disorder, remission status unspecified (HCC) -Mood is a stable, continue escitalopram 10 mg 1 tab daily   Family/ staff Communication: Discussed plan of care with resident.  Labs/tests ordered: None  Goals of care:   Long-term care   Durenda Age, DNP, FNP-BC [facility] and [facility] [phone] (Monday-Friday 8:00 a.m. - 5:00 p.m.) [phone] (after hours)

## 2019-04-08 DIAGNOSIS — F32A Depression, unspecified: Secondary | ICD-10-CM | POA: Insufficient documentation

## 2019-04-08 DIAGNOSIS — N3281 Overactive bladder: Secondary | ICD-10-CM | POA: Insufficient documentation

## 2019-04-08 DIAGNOSIS — F329 Major depressive disorder, single episode, unspecified: Secondary | ICD-10-CM | POA: Insufficient documentation

## 2019-04-12 NOTE — Progress Notes (Deleted)
NEUROLOGY FOLLOW UP OFFICE NOTE  Michaela [name] [id number]  HISTORY OF PRESENT ILLNESS: Michaela [name] is a 63 year old woman with depression, anxiety, neurogenic bladder and history of cerebral astrocytoma who follows up for cerebellar tremor and ataxia  UPDATE: Current medication for tremor:  Clonazepam 0.25mg  in AM and 0.25mg  at bedtime.  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 She has residual spasticity, cerebellar dysfunction and neurogenic bladder. She is nonambulatory at baseline. However, she is able to transition herself from the wheelchair to the bathroom. In 2013, she underwent mitral valve repair. Since then, she reports that her shaking has gotten worse. The shaking is episodic and usually occurs in the morning when she gets up. Otherwise, she is unaware of any triggers. Nothing seems to calm it, it just resolves spontaneously. It caused her anxiety and she was started on Lexapro, which has helped the anxiety but not the shaking. When it occurs getting out of bed, she has had falls. Otherwise, she reports no new symptoms.  MRI of the brain with and without contrast from 08/27/2018 was personally reviewed and demonstrated suboccipital craniectomy with surgical encephalomalacia, gliosis, and chronic blood products related to surgery and previous XRT, severe atrophy of the cerebellum and brainstem, and balloon fourth ventricle but not significantly increased compared to MRI from 2018 and other imaging going back to 2003.  No hydrocephalus following VP shunt decompression of lateral and third ventricles.  PAST MEDICAL HISTORY: Past Medical History:  Diagnosis Date  . 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 C. difficile colitis 03/6159   complicated by sepsis/ARDS and prolonged hosp  . Depression   . Hyperlipidemia   . Mitral  regurgitation 10/2011   severe, s/p min invasive repair and annuloplasty  . Mitral valve prolapse   . Neuromuscular disorder (HCC)    numbness in hands and feet  . Osteoarthritis   . Osteoporosis    DEXA @ [facility] 11/2012: -3.5, prior tx Reclast, last in 2011, rec change to Prolia  . Overactive bladder   . Pleural effusion, left 11/11/2011  . Vitamin D deficiency     MEDICATIONS: Current Outpatient Medications on File Prior to Visit  Medication Sig Dispense Refill  . atorvastatin (LIPITOR) 20 MG tablet Take 20 mg by mouth daily.     . bisacodyl (DULCOLAX) 10 MG suppository Constipation (2 of 4): If not relieved by MOM, give 10 mg Bisacodyl suppositiory rectally X 1 dose in 24 hours as needed (Do not use constipation standing orders for residents with renal failure/CFR less than 30. Contact MD for orders)    . cholecalciferol (VITAMIN D) 1000 units tablet Take 1,000 Units by mouth daily.    . clonazePAM (KLONOPIN) 0.25 MG disintegrating tablet Take 1 tablet (0.25 mg total) by mouth 2 (two) times daily for 60 doses. 60 tablet 0  . denosumab (PROLIA) 60 MG/ML SOLN injection Inject 60 mg into the skin every 6 (six) months. Administer in upper arm, thigh, or abdomen 1 Syringe 0  . escitalopram (LEXAPRO) 10 MG tablet Take 1 tablet (10 mg total) by mouth daily. 90 tablet 1  . linaclotide (LINZESS) 145 MCG CAPS capsule TAKE ONE CAPSULE BY MOUTH DAILY BEFORE BREAKFAST 90 capsule 2  . loratadine (CLARITIN) 10 MG tablet Take 10 mg by mouth daily.    . Menthol, Topical Analgesic, (BIOFREEZE) 4 % GEL Apply 1 application topically 2 (  two) times daily. Apply topically BID to sore areas - back    . mirabegron ER (MYRBETRIQ) 50 MG TB24 tablet Take 50 mg by mouth daily.    . polyvinyl alcohol (LIQUIFILM TEARS) 1.4 % ophthalmic solution Place 1 drop into both eyes 4 (four) times daily.     . psyllium (METAMUCIL) 58.6 % packet Take 1 packet by mouth every Monday, Wednesday, and Friday.    . Sodium Phosphates (RA  SALINE ENEMA RE) Constipation (3 of 4): If not relieved by Biscodyl suppository, give disposable Saline Enema rectally X 1 dose/24 hrs as needed (Do not use constipation standing orders for residents with renal failure/CFR less than 30. Contact MD for orders)    . White Petrolatum-Mineral Oil (LUBRIFRESH P.M. OP) Apply 1 application to eye at bedtime. Apply to right eye     No current facility-administered medications on file prior to visit.     ALLERGIES: Allergies  Allergen Reactions  . Compazine Other (See Comments)    "eyes get buggy"    FAMILY HISTORY: Family History  Problem Relation Age of Onset  . Coronary artery disease Father 61       died MI  . Hyperlipidemia Mother   . Heart attack Maternal Grandfather     SOCIAL HISTORY: Social History   Socioeconomic History  . Marital status: Single    Spouse name: Not on file  . Number of children: 0  . Years of education: Not on file  . Highest education level: Not on file  Occupational History  . Occupation: disabled  Social Needs  . Financial resource strain: Not hard at all  . Food insecurity    Worry: Never true    Inability: Never true  . Transportation needs    Medical: No    Non-medical: No  Tobacco Use  . Smoking status: Former Smoker    Quit date: 09/09/1990    Years since quitting: 28.6  . Smokeless tobacco: Never Used  Substance and Sexual Activity  . Alcohol use: Not Currently    Alcohol/week: 0.0 standard drinks    Comment: rarely  . Drug use: No  . Sexual activity: Not on file  Lifestyle  . Physical activity    Days per week: 0 days    Minutes per session: 0 min  . Stress: Only a little  Relationships  . Social Herbalist on phone: Three times a week    Gets together: Three times a week    Attends religious service: Never    Active member of club or organization: Not on file    Attends meetings of clubs or organizations: Never    Relationship status: Never married  . Intimate  partner violence    Fear of current or ex partner: No    Emotionally abused: No    Physically abused: No    Forced sexual activity: No  Other Topics Concern  . Not on file  Social History Narrative   [facility] to [facility] in [facility].      Patient is right-handed. She is a permanent resident of [facility].      REVIEW OF SYSTEMS: Constitutional: No fevers, chills, or sweats, no generalized fatigue, change in appetite Eyes: No visual changes, double vision, eye pain Ear, nose and throat: No hearing loss, ear pain, nasal congestion, sore throat Cardiovascular: No chest pain, palpitations Respiratory:  No shortness of breath at rest or with exertion, wheezes GastrointestinaI: No nausea, vomiting,  diarrhea, abdominal pain, fecal incontinence Genitourinary:  No dysuria, urinary retention or frequency Musculoskeletal:  No neck pain, back pain Integumentary: No rash, pruritus, skin lesions Neurological: as above Psychiatric: No depression, insomnia, anxiety Endocrine: No palpitations, fatigue, diaphoresis, mood swings, change in appetite, change in weight, increased thirst Hematologic/Lymphatic:  No purpura, petechiae. Allergic/Immunologic: no itchy/runny eyes, nasal congestion, recent allergic reactions, rashes  PHYSICAL EXAM: *** General: No acute distress.  Patient appears ***-groomed.   Head:  Normocephalic/atraumatic Eyes:  Fundi examined but not visualized Neck: supple, no paraspinal tenderness, full range of motion Heart:  Regular rate and rhythm Lungs:  Clear to auscultation bilaterally Back: No paraspinal tenderness Neurological Exam: alert and oriented to person, place, and time. Attention span and concentration intact, recent and remote memory intact, fund of knowledge intact.  Speech fluent and not dysarthric, language intact.  Left eye adducted on primary gaze, unable to abduct left eye, saccadic eye movements with tracking nystagmus.   Otherwise, CN II-XII intact. Decreased bulk and increased tone, muscle strength 5/5 throughout.  Sensation to light touch intact.  Deep tendon reflexes 2+ throughout.  Dysmetria bilaterally on finger to nose testing.  Nonambulatory.  IMPRESSION: Cerebellar tremor and ataxia, chronic and secondary to prior cerebellar astrocytoma and treatment.  ***  PLAN: 1.  Clonazepam 0.25mg  in morning and 0.25mg  at bedtime 2.  Follow up in 6 months  Metta Clines, DO  CC: ***

## 2019-04-13 ENCOUNTER — Ambulatory Visit: Payer: Medicare Other | Admitting: Neurology

## 2019-04-15 DIAGNOSIS — N390 Urinary tract infection, site not specified: Secondary | ICD-10-CM | POA: Diagnosis not present

## 2019-04-15 DIAGNOSIS — R319 Hematuria, unspecified: Secondary | ICD-10-CM | POA: Diagnosis not present

## 2019-04-15 DIAGNOSIS — I4891 Unspecified atrial fibrillation: Secondary | ICD-10-CM | POA: Diagnosis not present

## 2019-04-21 DIAGNOSIS — R41841 Cognitive communication deficit: Secondary | ICD-10-CM | POA: Diagnosis not present

## 2019-04-21 DIAGNOSIS — R278 Other lack of coordination: Secondary | ICD-10-CM | POA: Diagnosis not present

## 2019-04-21 DIAGNOSIS — Z85841 Personal history of malignant neoplasm of brain: Secondary | ICD-10-CM | POA: Diagnosis not present

## 2019-04-22 DIAGNOSIS — R41841 Cognitive communication deficit: Secondary | ICD-10-CM | POA: Diagnosis not present

## 2019-04-22 DIAGNOSIS — Z85841 Personal history of malignant neoplasm of brain: Secondary | ICD-10-CM | POA: Diagnosis not present

## 2019-04-22 DIAGNOSIS — R278 Other lack of coordination: Secondary | ICD-10-CM | POA: Diagnosis not present

## 2019-04-23 DIAGNOSIS — R41841 Cognitive communication deficit: Secondary | ICD-10-CM | POA: Diagnosis not present

## 2019-04-23 DIAGNOSIS — R278 Other lack of coordination: Secondary | ICD-10-CM | POA: Diagnosis not present

## 2019-04-23 DIAGNOSIS — Z85841 Personal history of malignant neoplasm of brain: Secondary | ICD-10-CM | POA: Diagnosis not present

## 2019-04-26 ENCOUNTER — Other Ambulatory Visit: Payer: Self-pay | Admitting: Adult Health

## 2019-04-26 DIAGNOSIS — R278 Other lack of coordination: Secondary | ICD-10-CM | POA: Diagnosis not present

## 2019-04-26 DIAGNOSIS — R41841 Cognitive communication deficit: Secondary | ICD-10-CM | POA: Diagnosis not present

## 2019-04-26 DIAGNOSIS — Z85841 Personal history of malignant neoplasm of brain: Secondary | ICD-10-CM | POA: Diagnosis not present

## 2019-04-26 MED ORDER — CLONAZEPAM 0.25 MG PO TBDP: 0.2500 mg | ORAL_TABLET | Freq: Two times a day (BID) | ORAL | 0 refills | Status: DC

## 2019-04-27 DIAGNOSIS — Z85841 Personal history of malignant neoplasm of brain: Secondary | ICD-10-CM | POA: Diagnosis not present

## 2019-04-27 DIAGNOSIS — R41841 Cognitive communication deficit: Secondary | ICD-10-CM | POA: Diagnosis not present

## 2019-04-27 DIAGNOSIS — R278 Other lack of coordination: Secondary | ICD-10-CM | POA: Diagnosis not present

## 2019-04-28 ENCOUNTER — Non-Acute Institutional Stay (SKILLED_NURSING_FACILITY): Payer: Medicare Other | Admitting: Adult Health

## 2019-04-28 ENCOUNTER — Encounter: Payer: Self-pay | Admitting: Adult Health

## 2019-04-28 DIAGNOSIS — R29818 Other symptoms and signs involving the nervous system: Secondary | ICD-10-CM

## 2019-04-28 DIAGNOSIS — R278 Other lack of coordination: Secondary | ICD-10-CM | POA: Diagnosis not present

## 2019-04-28 DIAGNOSIS — R4189 Other symptoms and signs involving cognitive functions and awareness: Secondary | ICD-10-CM | POA: Diagnosis not present

## 2019-04-28 DIAGNOSIS — Z85841 Personal history of malignant neoplasm of brain: Secondary | ICD-10-CM | POA: Diagnosis not present

## 2019-04-28 DIAGNOSIS — G709 Myoneural disorder, unspecified: Secondary | ICD-10-CM

## 2019-04-28 DIAGNOSIS — N3281 Overactive bladder: Secondary | ICD-10-CM | POA: Diagnosis not present

## 2019-04-28 DIAGNOSIS — K5909 Other constipation: Secondary | ICD-10-CM

## 2019-04-28 DIAGNOSIS — R41841 Cognitive communication deficit: Secondary | ICD-10-CM | POA: Diagnosis not present

## 2019-04-28 NOTE — Progress Notes (Signed)
Location:  [facility] Room Number: 101/B Place of Service:  SNF (31) Provider:  Durenda Age, DNP, FNP-BC  Patient Care Team: Hendricks Limes, MD as PCP - General (Internal Medicine) Larey Dresser, MD as Referring Physician (Cardiology) Larey Dresser, MD (Cardiology) Medina-Vargas, Senaida Lange, NP as Nurse Practitioner (Internal Medicine) Pieter Partridge, DO as Consulting Physician (Neurology)  Extended Emergency Contact Information Primary Emergency Contact: [name], [place] [address] [place] Phone: [phone] Relation: Sister  Code Status:  Full Code  Goals of care: Advanced Directive information Advanced Directives 04/28/2019  Does Patient Have a Medical Advance Directive? Yes  Type of Advance Directive (No Data)  Does patient want to make changes to medical advance directive? No - Patient declined  Would patient like information on creating a medical advance directive? -  Pre-existing out of facility DNR order (yellow form or pink MOST form) -     Chief Complaint  Patient presents with  . Medical Management of Chronic Issues    Routine Visit    HPI:  [name] seen today for medical management of chronic diseases. She has PMH of astrocytoma, mitral regurgitation, mitral valve prolapse, osteoarthritis and osteoporosis. Latest BIMS score is 12, moderate cognitive deficit. She is currently having ST to address cognition. She had a recent fall without injury. OT is currently working to develop her coordination when moving. She feels that her tremor is same from last month. She is taking Clonazepam to decrease tremors.    Past Medical History:  Diagnosis Date  . 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 C. difficile colitis 02/1949   complicated by sepsis/ARDS and prolonged hosp  . Depression   . Hyperlipidemia   . Mitral regurgitation 10/2011   severe,  s/p min invasive repair and annuloplasty  . Mitral valve prolapse   . Neuromuscular disorder ([facility])    numbness in hands and feet  . Osteoarthritis   . Osteoporosis    DEXA @ [facility] 11/2012: -3.5, prior tx Reclast, last in 2011, rec change to Prolia  . Overactive bladder   . Pleural effusion, left 11/11/2011  . Vitamin D deficiency    Past Surgical History:  Procedure Laterality Date  . CARDIAC CATHETERIZATION     Jan 2013  . KNEE ARTHROSCOPY Right   . MITRAL VALVE REPAIR  10/15/2011   Procedure: MINIMALLY INVASIVE MITRAL VALVE REPAIR (MVR);  Surgeon: Rexene Alberts, MD;  Location: [facility];  Service: Open Heart Surgery;  Laterality: Right;  . TRANSESOPHAGEAL ECHOCARDIOGRAM  7/12  . US ECHOCARDIOGRAPHY  6/12  . VENTRICULOPERITONEAL SHUNT  1971    Allergies  Allergen Reactions  . Compazine Other (See Comments)    "eyes get buggy"    Outpatient Encounter Medications as of 04/28/2019  Medication Sig  . atorvastatin (LIPITOR) 20 MG tablet Take 20 mg by mouth daily.   . bisacodyl (DULCOLAX) 10 MG suppository Constipation (2 of 4): If not relieved by MOM, give 10 mg Bisacodyl suppositiory rectally X 1 dose in 24 hours as needed (Do not use constipation standing orders for residents with renal failure/CFR less than 30. Contact MD for orders)  . cholecalciferol (VITAMIN D) 1000 units tablet Take 1,000 Units by mouth daily.  . clonazePAM (KLONOPIN) 0.25 MG disintegrating tablet Take 1 tablet (0.25 mg total) by mouth 2 (two) times daily for 60 doses.  Marland Kitchen  denosumab (PROLIA) 60 MG/ML SOLN injection Inject 60 mg into the skin every 6 (six) months. Administer in upper arm, thigh, or abdomen  . docusate sodium (COLACE) 100 MG capsule Take 100 mg by mouth 2 (two) times daily. DX;CONSTIPATION (DO NOT CRUSH)  . escitalopram (LEXAPRO) 10 MG tablet Take 1 tablet (10 mg total) by mouth daily.  Marland Kitchen linaclotide (LINZESS) 145 MCG CAPS capsule TAKE ONE CAPSULE BY MOUTH DAILY BEFORE BREAKFAST  . loratadine (CLARITIN)  10 MG tablet Take 10 mg by mouth daily.  . Menthol, Topical Analgesic, (BIOFREEZE) 4 % GEL Apply 1 application topically 2 (two) times daily. Apply topically BID to sore areas - back  . mirabegron ER (MYRBETRIQ) 50 MG TB24 tablet Take 50 mg by mouth daily.  . polyvinyl alcohol (LIQUIFILM TEARS) 1.4 % ophthalmic solution Place 1 drop into both eyes 4 (four) times daily.   . psyllium (METAMUCIL) 58.6 % packet Take 1 packet by mouth every Monday, Wednesday, and Friday.  . Sodium Phosphates (RA SALINE ENEMA RE) Constipation (3 of 4): If not relieved by Biscodyl suppository, give disposable Saline Enema rectally X 1 dose/24 hrs as needed (Do not use constipation standing orders for residents with renal failure/CFR less than 30. Contact MD for orders)  . White Petrolatum-Mineral Oil (LUBRIFRESH P.M. OP) Apply 1 application to eye at bedtime. Apply to right eye   No facility-administered encounter medications on file as of 04/28/2019.     Review of Systems  GENERAL: No change in appetite, no fatigue, no weight changes, no fever, chills or weakness MOUTH and THROAT: Denies oral discomfort, gingival pain or bleeding RESPIRATORY: no cough, SOB, DOE, wheezing, hemoptysis CARDIAC: No chest pain, edema or palpitations GI: No abdominal pain, diarrhea, constipation, heart burn, nausea or vomiting GU: Denies dysuria, frequency, hematuria, incontinence, or discharge NEUROLOGICAL: Denies dizziness, syncope, numbness, or headache PSYCHIATRIC: Denies feelings of depression or anxiety. No report of hallucinations, insomnia, paranoia, or agitation    Immunization History  Administered Date(s) Administered  . Influenza Split 08/11/2012  . Influenza,inj,Quad PF,6+ Mos 05/14/2013, 07/19/2014, 06/29/2015, 07/01/2016  . Influenza-Unspecified 06/09/2017, 07/07/2018  . PPD Test 01/23/2018  . Pneumococcal Polysaccharide-23 11/23/2011  . Tdap 01/23/2018   Pertinent  Health Maintenance Due  Topic Date Due  .  INFLUENZA VACCINE  05/29/2019 (Originally 04/10/2019)  . PAP SMEAR-Modifier  05/29/2019 (Originally 12/02/1978)  . COLONOSCOPY  05/29/2019 (Originally 07/27/2014)  . MAMMOGRAM  04/13/2020   Fall Risk  03/19/2019 10/13/2018 03/31/2018 01/22/2018 11/24/2017  Falls in the past year? 1 1 Yes Yes Yes  Number falls in past yr: 1 0 2 or more 1 2 or more  Injury with Fall? 1 0 Yes Yes Yes  Comment - - - - -  Risk Factor Category  - - High Fall Risk - High Fall Risk  Risk for fall due to : History of fall(s);Impaired balance/gait;Impaired mobility;Medication side effect - - - -  Follow up Falls evaluation completed;Education provided;Falls prevention discussed Falls evaluation completed - - Falls evaluation completed     Vitals:   04/28/19 0830  BP: 93/65  Pulse: 100  Resp: 18  Temp: 98.3 F (36.8 C)  TempSrc: Oral  SpO2: 93%  Weight: 85 lb 6.4 oz (38.7 kg)  Height: 4\' 11"  (1.499 m)   Body mass index is 17.25 kg/m.  Physical Exam  GENERAL APPEARANCE:  In no acute distress.  SKIN:  Skin is warm and dry.  MOUTH and THROAT: Lips are without lesions. Oral mucosa is moist and  without lesions. Tongue is normal in shape, size, and color and without lesions RESPIRATORY: Breathing is even & unlabored, BS CTAB CARDIAC: RRR, no murmur,no extra heart sounds, no edema GI: Abdomen soft, normal BS, no masses, no tenderness EXTREMITIES:  Able to move X 4 extremities NEUROLOGICAL: + tremor. Speech is clear.Alert and oriented X 3.  PSYCHIATRIC:  Affect and behavior are appropriate  Labs reviewed: Recent Labs    11/09/18  NA 140  K 4.0  CL 105  CO2 26  BUN 27*  CREATININE 0.4*  CALCIUM 8.6   Recent Labs    01/27/19  ALKPHOS 77   Recent Labs    09/11/18 11/09/18 12/02/18 12/04/18  WBC 5.4 5.7 4.8  --   NEUTROABS 3  --   --   --   HGB 11.6* 11.0* 12.0  --   HCT 35* 33* 36  --   PLT 170 190  --  167   Lab Results  Component Value Date   TSH 1.15 03/02/2015   Lab Results  Component  Value Date   HGBA1C 5.5 10/11/2011   Lab Results  Component Value Date   CHOL 135 01/27/2019   HDL 42 01/27/2019   LDLCALC 85 01/27/2019   LDLDIRECT 165.0 05/14/2013   TRIG 41 01/27/2019   CHOLHDL 4 11/06/2015     Assessment/Plan  1. Neurocognitive deficits - BIMS score 12/15, moderate cognitive deficit, currently having ST for cognitive exercises  2. Neuromuscular disorder ([facility]) - currently having Clonazepam for tremors, stable, OT working on self-care retraining and coordination, fall precautions  3. Overactive bladder - stable, continue Myrbetriq  4. Chronic constipation - denies constipation, will continue Docusate and Linzess    Family/ staff Communication:  Discussed plan of care with resident.  Labs/tests ordered:  None  Goals of care:   Long-term care   Durenda Age, DNP, FNP-BC [facility] [phone] (Monday-Friday 8:00 a.m. - 5:00 p.m.) [phone] (after hours)

## 2019-04-29 DIAGNOSIS — Z85841 Personal history of malignant neoplasm of brain: Secondary | ICD-10-CM | POA: Diagnosis not present

## 2019-04-29 DIAGNOSIS — R41841 Cognitive communication deficit: Secondary | ICD-10-CM | POA: Diagnosis not present

## 2019-04-29 DIAGNOSIS — R278 Other lack of coordination: Secondary | ICD-10-CM | POA: Diagnosis not present

## 2019-04-30 DIAGNOSIS — R278 Other lack of coordination: Secondary | ICD-10-CM | POA: Diagnosis not present

## 2019-04-30 DIAGNOSIS — Z85841 Personal history of malignant neoplasm of brain: Secondary | ICD-10-CM | POA: Diagnosis not present

## 2019-04-30 DIAGNOSIS — R41841 Cognitive communication deficit: Secondary | ICD-10-CM | POA: Diagnosis not present

## 2019-05-03 DIAGNOSIS — Z85841 Personal history of malignant neoplasm of brain: Secondary | ICD-10-CM | POA: Diagnosis not present

## 2019-05-03 DIAGNOSIS — R278 Other lack of coordination: Secondary | ICD-10-CM | POA: Diagnosis not present

## 2019-05-03 DIAGNOSIS — R41841 Cognitive communication deficit: Secondary | ICD-10-CM | POA: Diagnosis not present

## 2019-05-04 DIAGNOSIS — Z85841 Personal history of malignant neoplasm of brain: Secondary | ICD-10-CM | POA: Diagnosis not present

## 2019-05-04 DIAGNOSIS — R41841 Cognitive communication deficit: Secondary | ICD-10-CM | POA: Diagnosis not present

## 2019-05-04 DIAGNOSIS — R278 Other lack of coordination: Secondary | ICD-10-CM | POA: Diagnosis not present

## 2019-05-05 DIAGNOSIS — R278 Other lack of coordination: Secondary | ICD-10-CM | POA: Diagnosis not present

## 2019-05-05 DIAGNOSIS — R41841 Cognitive communication deficit: Secondary | ICD-10-CM | POA: Diagnosis not present

## 2019-05-05 DIAGNOSIS — Z85841 Personal history of malignant neoplasm of brain: Secondary | ICD-10-CM | POA: Diagnosis not present

## 2019-05-06 ENCOUNTER — Other Ambulatory Visit: Payer: Self-pay | Admitting: Adult Health

## 2019-05-06 DIAGNOSIS — Z85841 Personal history of malignant neoplasm of brain: Secondary | ICD-10-CM | POA: Diagnosis not present

## 2019-05-06 DIAGNOSIS — R278 Other lack of coordination: Secondary | ICD-10-CM | POA: Diagnosis not present

## 2019-05-06 DIAGNOSIS — R41841 Cognitive communication deficit: Secondary | ICD-10-CM | POA: Diagnosis not present

## 2019-05-06 MED ORDER — CLONAZEPAM 0.25 MG PO TBDP: 0.2500 mg | ORAL_TABLET | Freq: Two times a day (BID) | ORAL | 0 refills | Status: DC

## 2019-05-07 DIAGNOSIS — R278 Other lack of coordination: Secondary | ICD-10-CM | POA: Diagnosis not present

## 2019-05-07 DIAGNOSIS — R41841 Cognitive communication deficit: Secondary | ICD-10-CM | POA: Diagnosis not present

## 2019-05-07 DIAGNOSIS — Z85841 Personal history of malignant neoplasm of brain: Secondary | ICD-10-CM | POA: Diagnosis not present

## 2019-05-10 DIAGNOSIS — R278 Other lack of coordination: Secondary | ICD-10-CM | POA: Diagnosis not present

## 2019-05-10 DIAGNOSIS — Z85841 Personal history of malignant neoplasm of brain: Secondary | ICD-10-CM | POA: Diagnosis not present

## 2019-05-10 DIAGNOSIS — R41841 Cognitive communication deficit: Secondary | ICD-10-CM | POA: Diagnosis not present

## 2019-05-11 DIAGNOSIS — Z85841 Personal history of malignant neoplasm of brain: Secondary | ICD-10-CM | POA: Diagnosis not present

## 2019-05-11 DIAGNOSIS — R278 Other lack of coordination: Secondary | ICD-10-CM | POA: Diagnosis not present

## 2019-05-11 DIAGNOSIS — R41841 Cognitive communication deficit: Secondary | ICD-10-CM | POA: Diagnosis not present

## 2019-05-12 DIAGNOSIS — Z85841 Personal history of malignant neoplasm of brain: Secondary | ICD-10-CM | POA: Diagnosis not present

## 2019-05-12 DIAGNOSIS — R41841 Cognitive communication deficit: Secondary | ICD-10-CM | POA: Diagnosis not present

## 2019-05-12 DIAGNOSIS — R278 Other lack of coordination: Secondary | ICD-10-CM | POA: Diagnosis not present

## 2019-05-13 DIAGNOSIS — R278 Other lack of coordination: Secondary | ICD-10-CM | POA: Diagnosis not present

## 2019-05-13 DIAGNOSIS — R41841 Cognitive communication deficit: Secondary | ICD-10-CM | POA: Diagnosis not present

## 2019-05-13 DIAGNOSIS — Z85841 Personal history of malignant neoplasm of brain: Secondary | ICD-10-CM | POA: Diagnosis not present

## 2019-05-14 DIAGNOSIS — R278 Other lack of coordination: Secondary | ICD-10-CM | POA: Diagnosis not present

## 2019-05-14 DIAGNOSIS — R41841 Cognitive communication deficit: Secondary | ICD-10-CM | POA: Diagnosis not present

## 2019-05-14 DIAGNOSIS — Z85841 Personal history of malignant neoplasm of brain: Secondary | ICD-10-CM | POA: Diagnosis not present

## 2019-05-17 DIAGNOSIS — R278 Other lack of coordination: Secondary | ICD-10-CM | POA: Diagnosis not present

## 2019-05-17 DIAGNOSIS — R41841 Cognitive communication deficit: Secondary | ICD-10-CM | POA: Diagnosis not present

## 2019-05-17 DIAGNOSIS — Z85841 Personal history of malignant neoplasm of brain: Secondary | ICD-10-CM | POA: Diagnosis not present

## 2019-05-18 DIAGNOSIS — R41841 Cognitive communication deficit: Secondary | ICD-10-CM | POA: Diagnosis not present

## 2019-05-18 DIAGNOSIS — Z85841 Personal history of malignant neoplasm of brain: Secondary | ICD-10-CM | POA: Diagnosis not present

## 2019-05-18 DIAGNOSIS — R278 Other lack of coordination: Secondary | ICD-10-CM | POA: Diagnosis not present

## 2019-05-19 DIAGNOSIS — R278 Other lack of coordination: Secondary | ICD-10-CM | POA: Diagnosis not present

## 2019-05-19 DIAGNOSIS — Z85841 Personal history of malignant neoplasm of brain: Secondary | ICD-10-CM | POA: Diagnosis not present

## 2019-05-19 DIAGNOSIS — R41841 Cognitive communication deficit: Secondary | ICD-10-CM | POA: Diagnosis not present

## 2019-05-20 ENCOUNTER — Non-Acute Institutional Stay (SKILLED_NURSING_FACILITY): Payer: Medicare Other | Admitting: Adult Health

## 2019-05-20 ENCOUNTER — Encounter: Payer: Self-pay | Admitting: Adult Health

## 2019-05-20 DIAGNOSIS — N3281 Overactive bladder: Secondary | ICD-10-CM

## 2019-05-20 DIAGNOSIS — R251 Tremor, unspecified: Secondary | ICD-10-CM | POA: Diagnosis not present

## 2019-05-20 DIAGNOSIS — K5909 Other constipation: Secondary | ICD-10-CM

## 2019-05-20 DIAGNOSIS — F339 Major depressive disorder, recurrent, unspecified: Secondary | ICD-10-CM

## 2019-05-20 DIAGNOSIS — Z85841 Personal history of malignant neoplasm of brain: Secondary | ICD-10-CM | POA: Diagnosis not present

## 2019-05-20 DIAGNOSIS — R278 Other lack of coordination: Secondary | ICD-10-CM | POA: Diagnosis not present

## 2019-05-20 DIAGNOSIS — R41841 Cognitive communication deficit: Secondary | ICD-10-CM | POA: Diagnosis not present

## 2019-05-20 NOTE — Progress Notes (Signed)
Location:  [facility] Room Number: 101/B Place of Service:  SNF (31) Provider:  Durenda Age, DNP, FNP-BC  Patient Care Team: Hendricks Limes, MD as PCP - General (Internal Medicine) Larey Dresser, MD as Referring Physician (Cardiology) Larey Dresser, MD (Cardiology) Medina-Vargas, Senaida Lange, NP as Nurse Practitioner (Internal Medicine) Pieter Partridge, DO as Consulting Physician (Neurology)  Extended Emergency Contact Information Primary Emergency Contact: [name], [place] [address] [place] Phone: [phone] Relation: Sister  Code Status: Full Code  Goals of care: Advanced Directive information Advanced Directives 05/20/2019  Does Patient Have a Medical Advance Directive? Yes  Type of Advance Directive (No Data)  Does patient want to make changes to medical advance directive? No - Patient declined  Would patient like information on creating a medical advance directive? -  Pre-existing out of facility DNR order (yellow form or pink MOST form) -     Chief Complaint  Patient presents with  . Medical Management of Chronic Issues    Routine visit of medical management    HPI:  [name] seen today for medical management of chronic diseases. She has PMH of astrocytoma, mitral regurgitation, mitral valve prolapse, osteoarthritis and osteoporosis. She was seen today. She complained of constipation. She currently take Docusate, Linzess and Metamucil for chronic constipation. She continues to have tremors/shaking of her upper trunk and continues to take Clonazepam.   Past Medical History:  Diagnosis Date  . 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 C. difficile colitis [id number]   complicated by sepsis/ARDS and prolonged hosp  . Depression   . Hyperlipidemia   . Mitral regurgitation 10/2011   severe, s/p min invasive repair and annuloplasty  . Mitral valve  prolapse   . Neuromuscular disorder ([facility])    numbness in hands and feet  . Osteoarthritis   . Osteoporosis    DEXA @ [facility] 11/2012: -3.5, prior tx Reclast, last in 2011, rec change to Prolia  . Overactive bladder   . Pleural effusion, left 11/11/2011  . Vitamin D deficiency    Past Surgical History:  Procedure Laterality Date  . CARDIAC CATHETERIZATION     Jan 2013  . KNEE ARTHROSCOPY Right   . MITRAL VALVE REPAIR  10/15/2011   Procedure: MINIMALLY INVASIVE MITRAL VALVE REPAIR (MVR);  Surgeon: Rexene Alberts, MD;  Location: [facility];  Service: Open Heart Surgery;  Laterality: Right;  . TRANSESOPHAGEAL ECHOCARDIOGRAM  7/12  . US ECHOCARDIOGRAPHY  6/12  . VENTRICULOPERITONEAL SHUNT  1971    Allergies  Allergen Reactions  . Compazine Other (See Comments)    "eyes get buggy"    Outpatient Encounter Medications as of 05/20/2019  Medication Sig  . atorvastatin (LIPITOR) 20 MG tablet Take 20 mg by mouth daily.   . bisacodyl (DULCOLAX) 10 MG suppository Constipation (2 of 4): If not relieved by MOM, give 10 mg Bisacodyl suppositiory rectally X 1 dose in 24 hours as needed (Do not use constipation standing orders for residents with renal failure/CFR less than 30. Contact MD for orders)  . cholecalciferol (VITAMIN D) 1000 units tablet Take 1,000 Units by mouth daily.  . clonazePAM (KLONOPIN) 0.25 MG disintegrating tablet Take 1 tablet (0.25 mg total) by mouth 2 (two) times daily for 60 doses.  Marland Kitchen docusate sodium (COLACE) 100 MG capsule Take 100 mg by mouth 2 (two) times daily.  DX;CONSTIPATION (DO NOT CRUSH)  . escitalopram (LEXAPRO) 10 MG tablet Take 1 tablet (10 mg total) by mouth daily.  Marland Kitchen linaclotide (LINZESS) 145 MCG CAPS capsule TAKE ONE CAPSULE BY MOUTH DAILY BEFORE BREAKFAST  . loratadine (CLARITIN) 10 MG tablet Take 10 mg by mouth daily.  . Menthol, Topical Analgesic, (BIOFREEZE) 4 % GEL Apply 1 application topically 2 (two) times daily. Apply topically BID to sore areas - back  . mirabegron  ER (MYRBETRIQ) 50 MG TB24 tablet Take 50 mg by mouth daily.  . NON FORMULARY Diet: MECHANCIAL CHOPPED DIET prunes with breakfast and prefers wheat bread Prefers Decaf only Coffee  . polyvinyl alcohol (LIQUIFILM TEARS) 1.4 % ophthalmic solution Place 1 drop into both eyes 4 (four) times daily.   . psyllium (METAMUCIL) 58.6 % packet Take 1 packet by mouth every Monday, Wednesday, and Friday.  . Sodium Phosphates (RA SALINE ENEMA RE) Constipation (3 of 4): If not relieved by Biscodyl suppository, give disposable Saline Enema rectally X 1 dose/24 hrs as needed (Do not use constipation standing orders for residents with renal failure/CFR less than 30. Contact MD for orders)  . White Petrolatum-Mineral Oil (LUBRIFRESH P.M. OP) Apply 1 application to eye at bedtime. Apply to right eye   No facility-administered encounter medications on file as of 05/20/2019.     Review of Systems  GENERAL: No change in appetite, no fatigue, no weight changes, no fever, chills or weakness MOUTH and THROAT: Denies oral discomfort, gingival pain or bleeding RESPIRATORY: no cough, SOB, DOE, wheezing, hemoptysis CARDIAC: No chest pain, edema or palpitations GI: No abdominal pain, + constipation GU: Denies dysuria, frequency, hematuria, incontinence, or discharge NEUROLOGICAL: Denies dizziness, syncope, numbness, or headache PSYCHIATRIC: Denies feelings of depression or anxiety. No report of hallucinations, insomnia, paranoia, or agitation    Immunization History  Administered Date(s) Administered  . Influenza Split 08/11/2012  . Influenza,inj,Quad PF,6+ Mos 05/14/2013, 07/19/2014, 06/29/2015, 07/01/2016  . Influenza-Unspecified 06/09/2017, 07/07/2018  . PPD Test 01/23/2018  . Pneumococcal Polysaccharide-23 11/23/2011  . Tdap 01/23/2018   Pertinent  Health Maintenance Due  Topic Date Due  . INFLUENZA VACCINE  05/29/2019 (Originally 04/10/2019)  . PAP SMEAR-Modifier  05/29/2019 (Originally 12/02/1978)  .  COLONOSCOPY  05/29/2019 (Originally 07/27/2014)  . MAMMOGRAM  04/13/2020   Fall Risk  03/19/2019 10/13/2018 03/31/2018 01/22/2018 11/24/2017  Falls in the past year? 1 1 Yes Yes Yes  Number falls in past yr: 1 0 2 or more 1 2 or more  Injury with Fall? 1 0 Yes Yes Yes  Comment - - - - -  Risk Factor Category  - - High Fall Risk - High Fall Risk  Risk for fall due to : History of fall(s);Impaired balance/gait;Impaired mobility;Medication side effect - - - -  Follow up Falls evaluation completed;Education provided;Falls prevention discussed Falls evaluation completed - - Falls evaluation completed     Vitals:   05/20/19 1241  BP: (!) 104/58  Pulse: 95  Resp: 18  Temp: 98.2 F (36.8 C)  TempSrc: Oral  SpO2: 96%  Weight: 88 lb 3.2 oz (40 kg)  Height: 4\' 11"  (1.499 m)   Body mass index is 17.81 kg/m.  Physical Exam  GENERAL APPEARANCE:  In no acute distress.  SKIN:  Skin is warm and dry.  MOUTH and THROAT: Lips are without lesions. Oral mucosa is moist and without lesions.  RESPIRATORY: Breathing is even & unlabored, BS CTAB CARDIAC: RRR, no murmur,no extra heart sounds, no edema GI: Abdomen soft, normal BS,  no masses, no tendernessNEUROLOGICAL: There is no tremor. Speech is clear. Alert and oriented X 3. +Tremors. PSYCHIATRIC:  Affect and behavior are appropriate  Labs reviewed: Recent Labs    11/09/18  NA 140  K 4.0  CL 105  CO2 26  BUN 27*  CREATININE 0.4*  CALCIUM 8.6   Recent Labs    01/27/19  ALKPHOS 77   Recent Labs    09/11/18 11/09/18 12/02/18 12/04/18  WBC 5.4 5.7 4.8  --   NEUTROABS 3  --   --   --   HGB 11.6* 11.0* 12.0  --   HCT 35* 33* 36  --   PLT 170 190  --  167   Lab Results  Component Value Date   TSH 1.15 03/02/2015   Lab Results  Component Value Date   HGBA1C 5.5 10/11/2011   Lab Results  Component Value Date   CHOL 135 01/27/2019   HDL 42 01/27/2019   LDLCALC 85 01/27/2019   LDLDIRECT 165.0 05/14/2013   TRIG 41 01/27/2019    CHOLHDL 4 11/06/2015     Assessment/Plan  1. Chronic constipation - will discontinue Docusate and start Senna-S 8.6-50 mg 2 tabs BID and prune juice 120 ml every other day, continue Metamucil and Linzess  2. Overactive bladder - denies having incontinence, continue Myrbetriq  3. Recurrent major depressive disorder, remission status unspecified ([place]) - she socializes with other residents, continue Escitalopram  4. Tremors of nervous system - stable, continue Clonazepam, fall precautions   Family/ staff Communication: Discussed plan pf care with resident.  Labs/tests ordered:  None  Goals of care:  Long-term care   Durenda Age, DNP, FNP-BC [facility] [phone] (Monday-Friday 8:00 a.m. - 5:00 p.m.) [phone] (after hours)

## 2019-05-20 NOTE — Progress Notes (Signed)
This encounter was created in error - please disregard.

## 2019-05-20 NOTE — Progress Notes (Deleted)
Location:  [facility] Room Number: 101/B Place of Service:  SNF (31) Provider:  Durenda Age, DNP, FNP-BC  Patient Care Team: Hendricks Limes, MD as PCP - General (Internal Medicine) Larey Dresser, MD as Referring Physician (Cardiology) Larey Dresser, MD (Cardiology) Medina-Vargas, Senaida Lange, NP as Nurse Practitioner (Internal Medicine) Pieter Partridge, DO as Consulting Physician (Neurology)  Extended Emergency Contact Information Primary Emergency Contact: [name], [place] [address] [place] Phone: [phone] Relation: Sister  Code Status:  Full Code  Goals of care: Advanced Directive information Advanced Directives 05/20/2019  Does Patient Have a Medical Advance Directive? Yes  Type of Advance Directive (No Data)  Does patient want to make changes to medical advance directive? No - Patient declined  Would patient like information on creating a medical advance directive? -  Pre-existing out of facility DNR order (yellow form or pink MOST form) -     Chief Complaint  Patient presents with  . Medical Management of Chronic Issues    Routine visit of medical management    HPI:  [name] seen today for medical management of chronic diseases.     Past Medical History:  Diagnosis Date  . 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 C. difficile colitis [id number]   complicated by sepsis/ARDS and prolonged hosp  . Depression   . Hyperlipidemia   . Mitral regurgitation 10/2011   severe, s/p min invasive repair and annuloplasty  . Mitral valve prolapse   . Neuromuscular disorder (HCC)    numbness in hands and feet  . Osteoarthritis   . Osteoporosis    DEXA @ [facility] 11/2012: -3.5, prior tx Reclast, last in 2011, rec change to Prolia  . Overactive bladder   . Pleural effusion, left 11/11/2011  . Vitamin D deficiency    Past Surgical History:  Procedure Laterality  Date  . CARDIAC CATHETERIZATION     Jan 2013  . KNEE ARTHROSCOPY Right   . MITRAL VALVE REPAIR  10/15/2011   Procedure: MINIMALLY INVASIVE MITRAL VALVE REPAIR (MVR);  Surgeon: Rexene Alberts, MD;  Location: [facility];  Service: Open Heart Surgery;  Laterality: Right;  . TRANSESOPHAGEAL ECHOCARDIOGRAM  7/12  . US ECHOCARDIOGRAPHY  6/12  . VENTRICULOPERITONEAL SHUNT  1971    Allergies  Allergen Reactions  . Compazine Other (See Comments)    "eyes get buggy"    Outpatient Encounter Medications as of 05/20/2019  Medication Sig  . atorvastatin (LIPITOR) 20 MG tablet Take 20 mg by mouth daily.   . bisacodyl (DULCOLAX) 10 MG suppository Constipation (2 of 4): If not relieved by MOM, give 10 mg Bisacodyl suppositiory rectally X 1 dose in 24 hours as needed (Do not use constipation standing orders for residents with renal failure/CFR less than 30. Contact MD for orders)  . cholecalciferol (VITAMIN D) 1000 units tablet Take 1,000 Units by mouth daily.  . clonazePAM (KLONOPIN) 0.25 MG disintegrating tablet Take 1 tablet (0.25 mg total) by mouth 2 (two) times daily for 60 doses.  Marland Kitchen docusate sodium (COLACE) 100 MG capsule Take 100 mg by mouth 2 (two) times daily. DX;CONSTIPATION (DO NOT CRUSH)  . escitalopram (LEXAPRO) 10 MG tablet Take 1 tablet (10 mg total) by mouth daily.  Marland Kitchen linaclotide (LINZESS) 145 MCG CAPS capsule TAKE ONE CAPSULE BY MOUTH DAILY BEFORE BREAKFAST  . loratadine (CLARITIN) 10 MG  tablet Take 10 mg by mouth daily.  . Menthol, Topical Analgesic, (BIOFREEZE) 4 % GEL Apply 1 application topically 2 (two) times daily. Apply topically BID to sore areas - back  . mirabegron ER (MYRBETRIQ) 50 MG TB24 tablet Take 50 mg by mouth daily.  . NON FORMULARY Diet: MECHANCIAL CHOPPED DIET prunes with breakfast and prefers wheat bread Prefers Decaf only Coffee  . polyvinyl alcohol (LIQUIFILM TEARS) 1.4 % ophthalmic solution Place 1 drop into both eyes 4 (four) times daily.   . psyllium (METAMUCIL) 58.6  % packet Take 1 packet by mouth every Monday, Wednesday, and Friday.  . Sodium Phosphates (RA SALINE ENEMA RE) Constipation (3 of 4): If not relieved by Biscodyl suppository, give disposable Saline Enema rectally X 1 dose/24 hrs as needed (Do not use constipation standing orders for residents with renal failure/CFR less than 30. Contact MD for orders)  . White Petrolatum-Mineral Oil (LUBRIFRESH P.M. OP) Apply 1 application to eye at bedtime. Apply to right eye  . [DISCONTINUED] denosumab (PROLIA) 60 MG/ML SOLN injection Inject 60 mg into the skin every 6 (six) months. Administer in upper arm, thigh, or abdomen   No facility-administered encounter medications on file as of 05/20/2019.     Review of Systems  GENERAL: No change in appetite, no fatigue, no weight changes, no fever, chills or weakness SKIN: Denies rash, itching, wounds, ulcer sores, or nail abnormalities EYES: Denies change in vision, dry eyes, eye pain, itching or discharge EARS: Denies change in hearing, ringing in ears, or earache NOSE: Denies nasal congestion or epistaxis MOUTH and THROAT: Denies oral discomfort, gingival pain or bleeding, pain from teeth or hoarseness   RESPIRATORY: no cough, SOB, DOE, wheezing, hemoptysis CARDIAC: No chest pain, edema or palpitations GI: No abdominal pain, diarrhea, constipation, heart burn, nausea or vomiting GU: Denies dysuria, frequency, hematuria, incontinence, or discharge MUSCULOSKELETAL: Denies joint pain, muscle pain, back pain, restricted movement, or unusual weakness CIRCULATION: Denies claudication, edema of legs, varicosities, or cold extremities NEUROLOGICAL: Denies dizziness, syncope, numbness, or headache PSYCHIATRIC: Denies feelings of depression or anxiety. No report of hallucinations, insomnia, paranoia, or agitation ENDOCRINE: Denies polyphagia, polyuria, polydipsia, heat or cold intolerance HEME/LYMPH: Denies excessive bruising, petechia, enlarged lymph nodes, or bleeding  problems IMMUNOLOGIC: Denies history of frequent infections, AIDS, or use of immunosuppressive agents   Immunization History  Administered Date(s) Administered  . Influenza Split 08/11/2012  . Influenza,inj,Quad PF,6+ Mos 05/14/2013, 07/19/2014, 06/29/2015, 07/01/2016  . Influenza-Unspecified 06/09/2017, 07/07/2018  . PPD Test 01/23/2018  . Pneumococcal Polysaccharide-23 11/23/2011  . Tdap 01/23/2018   Pertinent  Health Maintenance Due  Topic Date Due  . INFLUENZA VACCINE  05/29/2019 (Originally 04/10/2019)  . PAP SMEAR-Modifier  05/29/2019 (Originally 12/02/1978)  . COLONOSCOPY  05/29/2019 (Originally 07/27/2014)  . MAMMOGRAM  04/13/2020   Fall Risk  03/19/2019 10/13/2018 03/31/2018 01/22/2018 11/24/2017  Falls in the past year? 1 1 Yes Yes Yes  Number falls in past yr: 1 0 2 or more 1 2 or more  Injury with Fall? 1 0 Yes Yes Yes  Comment - - - - -  Risk Factor Category  - - High Fall Risk - High Fall Risk  Risk for fall due to : History of fall(s);Impaired balance/gait;Impaired mobility;Medication side effect - - - -  Follow up Falls evaluation completed;Education provided;Falls prevention discussed Falls evaluation completed - - Falls evaluation completed     Vitals:   05/20/19 1221  BP: (!) 104/58  Pulse: 95  Resp: 18  Temp: 98.2  F (36.8 C)  TempSrc: Oral  SpO2: 96%  Weight: 88 lb 3.2 oz (40 kg)  Height: 4\' 11"  (1.499 m)   Body mass index is 17.81 kg/m.  Physical Exam  GENERAL APPEARANCE: Well nourished. In no acute distress. Normal body habitus SKIN:  Skin is warm and dry. There are no suspicious lesions or rash HEAD: Normal in size and contour. No evidence of trauma EYES: Lids open and close normally. No blepharitis, entropion or ectropion. PERRL. Conjunctivae are clear and sclerae are white. Lenses are without opacity EARS: Pinnae are normal. Patient hears normal voice tunes of the examiner MOUTH and THROAT: Lips are without lesions. Oral mucosa is moist and  without lesions. Tongue is normal in shape, size, and color and without lesions NECK: supple, trachea midline, no neck masses, no thyroid tenderness, no thyromegaly LYMPHATICS: No LAN in the neck, no supraclavicular LAN RESPIRATORY: Breathing is even & unlabored, BS CTAB CARDIAC: RRR, no murmur,no extra heart sounds, no edema GI: Abdomen soft, normal BS, no masses, no tenderness, no hepatomegaly, no splenomegaly MUSCULOSKELETAL: No deformities. Movement at each extremity is full and painless. Strength is 5/5 at each extremity. Back is without kyphosis or scoliosis CIRCULATION: Pedal pulses are 2+. There is no edema of the legs, ankles and feet NEUROLOGICAL: There is no tremor. Speech is clear PSYCHIATRIC: Alert and oriented X 3. Affect and behavior are appropriate  Labs reviewed: Recent Labs    11/09/18  NA 140  K 4.0  CL 105  CO2 26  BUN 27*  CREATININE 0.4*  CALCIUM 8.6   Recent Labs    01/27/19  ALKPHOS 77   Recent Labs    09/11/18 11/09/18 12/02/18 12/04/18  WBC 5.4 5.7 4.8  --   NEUTROABS 3  --   --   --   HGB 11.6* 11.0* 12.0  --   HCT 35* 33* 36  --   PLT 170 190  --  167   Lab Results  Component Value Date   TSH 1.15 03/02/2015   Lab Results  Component Value Date   HGBA1C 5.5 10/11/2011   Lab Results  Component Value Date   CHOL 135 01/27/2019   HDL 42 01/27/2019   LDLCALC 85 01/27/2019   LDLDIRECT 165.0 05/14/2013   TRIG 41 01/27/2019   CHOLHDL 4 11/06/2015    Significant Diagnostic Results in last 30 days:  No results found.  Assessment/Plan    Family/ staff Communication:   Labs/tests ordered:    Goals of care:      Durenda Age, DNP, FNP-BC [facility] [phone] (Monday-Friday 8:00 a.m. - 5:00 p.m.) [phone] (after hours)

## 2019-05-21 DIAGNOSIS — Z85841 Personal history of malignant neoplasm of brain: Secondary | ICD-10-CM | POA: Diagnosis not present

## 2019-05-21 DIAGNOSIS — R278 Other lack of coordination: Secondary | ICD-10-CM | POA: Diagnosis not present

## 2019-05-21 DIAGNOSIS — R41841 Cognitive communication deficit: Secondary | ICD-10-CM | POA: Diagnosis not present

## 2019-05-24 DIAGNOSIS — R251 Tremor, unspecified: Secondary | ICD-10-CM | POA: Insufficient documentation

## 2019-05-24 DIAGNOSIS — R278 Other lack of coordination: Secondary | ICD-10-CM | POA: Diagnosis not present

## 2019-05-24 DIAGNOSIS — R41841 Cognitive communication deficit: Secondary | ICD-10-CM | POA: Diagnosis not present

## 2019-05-24 DIAGNOSIS — Z85841 Personal history of malignant neoplasm of brain: Secondary | ICD-10-CM | POA: Diagnosis not present

## 2019-05-25 DIAGNOSIS — R41841 Cognitive communication deficit: Secondary | ICD-10-CM | POA: Diagnosis not present

## 2019-05-25 DIAGNOSIS — R278 Other lack of coordination: Secondary | ICD-10-CM | POA: Diagnosis not present

## 2019-05-25 DIAGNOSIS — Z85841 Personal history of malignant neoplasm of brain: Secondary | ICD-10-CM | POA: Diagnosis not present

## 2019-05-26 DIAGNOSIS — R278 Other lack of coordination: Secondary | ICD-10-CM | POA: Diagnosis not present

## 2019-05-26 DIAGNOSIS — Z85841 Personal history of malignant neoplasm of brain: Secondary | ICD-10-CM | POA: Diagnosis not present

## 2019-05-26 DIAGNOSIS — R41841 Cognitive communication deficit: Secondary | ICD-10-CM | POA: Diagnosis not present

## 2019-05-27 ENCOUNTER — Other Ambulatory Visit: Payer: Self-pay | Admitting: Internal Medicine

## 2019-05-27 DIAGNOSIS — Z85841 Personal history of malignant neoplasm of brain: Secondary | ICD-10-CM | POA: Diagnosis not present

## 2019-05-27 DIAGNOSIS — R41841 Cognitive communication deficit: Secondary | ICD-10-CM | POA: Diagnosis not present

## 2019-05-27 DIAGNOSIS — R278 Other lack of coordination: Secondary | ICD-10-CM | POA: Diagnosis not present

## 2019-05-27 MED ORDER — CLONAZEPAM 0.25 MG PO TBDP: 0.2500 mg | ORAL_TABLET | Freq: Two times a day (BID) | ORAL | 0 refills | Status: DC

## 2019-05-28 DIAGNOSIS — R278 Other lack of coordination: Secondary | ICD-10-CM | POA: Diagnosis not present

## 2019-05-28 DIAGNOSIS — R41841 Cognitive communication deficit: Secondary | ICD-10-CM | POA: Diagnosis not present

## 2019-05-28 DIAGNOSIS — Z85841 Personal history of malignant neoplasm of brain: Secondary | ICD-10-CM | POA: Diagnosis not present

## 2019-05-31 DIAGNOSIS — R278 Other lack of coordination: Secondary | ICD-10-CM | POA: Diagnosis not present

## 2019-05-31 DIAGNOSIS — R41841 Cognitive communication deficit: Secondary | ICD-10-CM | POA: Diagnosis not present

## 2019-05-31 DIAGNOSIS — Z85841 Personal history of malignant neoplasm of brain: Secondary | ICD-10-CM | POA: Diagnosis not present

## 2019-06-01 DIAGNOSIS — Z85841 Personal history of malignant neoplasm of brain: Secondary | ICD-10-CM | POA: Diagnosis not present

## 2019-06-01 DIAGNOSIS — R41841 Cognitive communication deficit: Secondary | ICD-10-CM | POA: Diagnosis not present

## 2019-06-01 DIAGNOSIS — R278 Other lack of coordination: Secondary | ICD-10-CM | POA: Diagnosis not present

## 2019-06-02 DIAGNOSIS — R41841 Cognitive communication deficit: Secondary | ICD-10-CM | POA: Diagnosis not present

## 2019-06-02 DIAGNOSIS — Z85841 Personal history of malignant neoplasm of brain: Secondary | ICD-10-CM | POA: Diagnosis not present

## 2019-06-02 DIAGNOSIS — R278 Other lack of coordination: Secondary | ICD-10-CM | POA: Diagnosis not present

## 2019-06-03 DIAGNOSIS — R41841 Cognitive communication deficit: Secondary | ICD-10-CM | POA: Diagnosis not present

## 2019-06-03 DIAGNOSIS — Z85841 Personal history of malignant neoplasm of brain: Secondary | ICD-10-CM | POA: Diagnosis not present

## 2019-06-03 DIAGNOSIS — R278 Other lack of coordination: Secondary | ICD-10-CM | POA: Diagnosis not present

## 2019-06-04 DIAGNOSIS — R278 Other lack of coordination: Secondary | ICD-10-CM | POA: Diagnosis not present

## 2019-06-04 DIAGNOSIS — Z85841 Personal history of malignant neoplasm of brain: Secondary | ICD-10-CM | POA: Diagnosis not present

## 2019-06-04 DIAGNOSIS — R41841 Cognitive communication deficit: Secondary | ICD-10-CM | POA: Diagnosis not present

## 2019-06-09 ENCOUNTER — Other Ambulatory Visit: Payer: Self-pay | Admitting: Adult Health

## 2019-06-09 MED ORDER — CLONAZEPAM 0.25 MG PO TBDP: 0.2500 mg | ORAL_TABLET | Freq: Two times a day (BID) | ORAL | 0 refills | Status: DC

## 2019-06-16 ENCOUNTER — Non-Acute Institutional Stay (SKILLED_NURSING_FACILITY): Payer: Medicare Other | Admitting: Internal Medicine

## 2019-06-16 ENCOUNTER — Encounter: Payer: Self-pay | Admitting: Internal Medicine

## 2019-06-16 DIAGNOSIS — G709 Myoneural disorder, unspecified: Secondary | ICD-10-CM | POA: Diagnosis not present

## 2019-06-16 DIAGNOSIS — K5909 Other constipation: Secondary | ICD-10-CM | POA: Diagnosis not present

## 2019-06-16 DIAGNOSIS — I480 Paroxysmal atrial fibrillation: Secondary | ICD-10-CM | POA: Diagnosis not present

## 2019-06-16 NOTE — Patient Instructions (Signed)
See assessment and plan under each diagnosis in the problem list and acutely for this visit 

## 2019-06-16 NOTE — Assessment & Plan Note (Signed)
06/16/2019 rhythm is regular.

## 2019-06-16 NOTE — Progress Notes (Signed)
NURSING HOME LOCATION:  [facility] ROOM NUMBER:  101-B  CODE STATUS:  Full Code  PCP:  Hendricks Limes, MD  [place] [address]   This is a nursing facility follow up of chronic medical diagnoses.  Interim medical record and care since last [place] visit was updated with review of diagnostic studies and change in clinical status since last visit were documented.  HPI: She is a permanent resident of the facility with medical diagnoses of PAF, dyslipidemia, gastroparesis, depression, vitamin D deficiency, overactive bladder, MVP with regurgitation, and history of astrocytoma brain tumor resected in her pre-teens.  She has a cerebellar tremor which is treated with clonazepam by Dr. Osborn Coho.  Unfortunately she is nonambulatory and wheelchair-bound due to spastic paralysis.   She had minimally invasive mitral valve repair in 2013. She does not drink and is a remote smoker. Because of the corona quarantine labs are not current.  Hematologic and chemistry values done in March and May of this year were normal.  Review of systems: Her only complaint is loose-watery stools she calls "diarrhea" & which she attributes to what she believes is  Senna Plus.  She is requesting that it be discontinued.  I do not see this on the med list.  She does have Dulcolax as needed.  She is on maintenance Linzess & Metamucil 3 times a week. She denies any associated abdominal pain or fever. She describes a minor intermittent nonproductive cough. She denies any definite COVID symptoms. She does have occasional numbness of her upper extremities and hands.  Constitutional: No fever, significant weight change, fatigue  Eyes: No redness, discharge, pain, vision change ENT/mouth: No nasal congestion,  purulent discharge, earache, change in hearing, sore throat  Cardiovascular: No chest pain, palpitations, paroxysmal nocturnal dyspnea, claudication, edema   Respiratory: No  sputum production, hemoptysis, DOE, significant snoring, apnea   Gastrointestinal: No heartburn, dysphagia, abdominal pain, nausea /vomiting, rectal bleeding, melena Genitourinary: No dysuria, hematuria, pyuria, incontinence, nocturia Musculoskeletal: No joint stiffness, joint swelling, weakness, pain Dermatologic: No rash, pruritus, change in appearance of skin Neurologic: No dizziness, headache, syncope, seizures,  tingling Psychiatric: No significant anxiety, depression, insomnia, anorexia Endocrine: No change in hair/skin/nails, excessive thirst, excessive hunger, excessive urination  Hematologic/lymphatic: No significant bruising, lymphadenopathy, abnormal bleeding Allergy/immunology: No itchy/watery eyes, significant sneezing, urticaria, angioedema  Physical exam:  Pertinent or positive findings: She seems to be somewhat hard of hearing but of course the N 95 and face shield make oral communication  difficult.  She sits in the wheelchair with constant rocking of the head and trunk.  Esotropia is present on the left.  Pedal pulses are decreased.  Strength is fair to opposition.  Her voluntary movements are jerky and spastic.  Stool was visualized and is soft but not frankly liquid.  General appearance: Thin but adequately nourished; no acute distress, increased work of breathing is present.   Lymphatic: No lymphadenopathy about the head, neck, axilla. Eyes: No conjunctival inflammation or lid edema is present. There is no scleral icterus. Ears:  External ear exam shows no significant lesions or deformities.   Nose:  External nasal examination shows no deformity or inflammation. Nasal mucosa are pink and moist without lesions, exudates Oral exam:  Lips and gums are healthy appearing. There is no oropharyngeal erythema or exudate. Neck:  No thyromegaly, masses, tenderness noted.    Heart:  Normal rate and regular rhythm. S1 and S2 normal without gallop, murmur, click, rub .  Lungs: Chest clear to auscultation without wheezes, rhonchi, rales, rubs. Abdomen: Bowel sounds are normal. Abdomen is soft and nontender with no organomegaly, hernias, masses. GU: Deferred  Extremities:  No cyanosis, clubbing, edema  Neurologic exam : Balance, Rhomberg, finger to nose testing could not be completed due to clinical state Deep tendon reflexes are equal Skin: Warm & dry w/o tenting. No significant lesions or rash.  See summary under each active problem in the Problem List with associated updated therapeutic plan

## 2019-06-16 NOTE — Assessment & Plan Note (Addendum)
06/16/2019 she describes diarrhea.  Stool visualized was soft & brown but not diarrheal.  Linzess will be held unless she becomes constipated.  Clinically there is no suggestion of C. difficile colitis. GI screening with Dr Henrene Pastor will be pursued as per guidelines post lifting of quarantine.

## 2019-06-16 NOTE — Assessment & Plan Note (Addendum)
She was listed as "No Show " in August due to quarantine. Neurology follow-up will be rescheduled with Dr Tomi Likens following lifting of the COVID-19 quarantine.

## 2019-06-25 ENCOUNTER — Non-Acute Institutional Stay (SKILLED_NURSING_FACILITY): Payer: Medicare Other | Admitting: Adult Health

## 2019-06-25 ENCOUNTER — Encounter: Payer: Self-pay | Admitting: Adult Health

## 2019-06-25 DIAGNOSIS — N319 Neuromuscular dysfunction of bladder, unspecified: Secondary | ICD-10-CM

## 2019-06-25 DIAGNOSIS — K5909 Other constipation: Secondary | ICD-10-CM | POA: Diagnosis not present

## 2019-06-25 DIAGNOSIS — F419 Anxiety disorder, unspecified: Secondary | ICD-10-CM | POA: Diagnosis not present

## 2019-06-25 DIAGNOSIS — E785 Hyperlipidemia, unspecified: Secondary | ICD-10-CM | POA: Diagnosis not present

## 2019-06-25 DIAGNOSIS — H04123 Dry eye syndrome of bilateral lacrimal glands: Secondary | ICD-10-CM

## 2019-06-25 DIAGNOSIS — M818 Other osteoporosis without current pathological fracture: Secondary | ICD-10-CM | POA: Diagnosis not present

## 2019-06-25 DIAGNOSIS — F339 Major depressive disorder, recurrent, unspecified: Secondary | ICD-10-CM | POA: Diagnosis not present

## 2019-06-25 DIAGNOSIS — R251 Tremor, unspecified: Secondary | ICD-10-CM | POA: Diagnosis not present

## 2019-06-25 NOTE — Progress Notes (Addendum)
Location:  [facility] Room Number: 101/B Place of Service:  SNF (31) Provider:  Durenda Age, DNP, FNP-BC  Patient Care Team: Hendricks Limes, MD as PCP - General (Internal Medicine) Larey Dresser, MD as Referring Physician (Cardiology) Larey Dresser, MD (Cardiology) Medina-Vargas, Senaida Lange, NP as Nurse Practitioner (Internal Medicine) Pieter Partridge, DO as Consulting Physician (Neurology)  Extended Emergency Contact Information Primary Emergency Contact: [name], [place] [address] [place] Phone: [phone] Relation: Sister  Code Status:  Full Code  Goals of care: Advanced Directive information Advanced Directives 06/25/2019  Does Patient Have a Medical Advance Directive? Yes  Type of Advance Directive (No Data)  Does patient want to make changes to medical advance directive? -  Would patient like information on creating a medical advance directive? -  Pre-existing out of facility DNR order (yellow form or pink MOST form) -     Chief Complaint  Patient presents with  . Discharge Note    Discharge Visit    HPI:  [name] who is for discharge to [facility] at [place].  She has been admitted to [facility] on 12/03/17. She is a long-term care resident. She has PMH of astrocytoma, mitral regurgitation, mitral valve prolapse, osteoarthritis and osteoporosis.  Past Medical History:  Diagnosis Date  . 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 C. difficile colitis [id number]   complicated by sepsis/ARDS and prolonged hosp  . Depression   . Hyperlipidemia   . Mitral regurgitation 10/2011   severe, s/p min invasive repair and annuloplasty  . Mitral valve prolapse   . Neuromuscular disorder (HCC)    numbness in hands and feet  . Osteoarthritis   . Osteoporosis    DEXA @ [facility] 11/2012: -3.5, prior tx Reclast, last in 2011, rec change to  Prolia  . Overactive bladder   . Pleural effusion, left 11/11/2011  . Vitamin D deficiency    Past Surgical History:  Procedure Laterality Date  . CARDIAC CATHETERIZATION     Jan 2013  . KNEE ARTHROSCOPY Right   . MITRAL VALVE REPAIR  10/15/2011   Procedure: MINIMALLY INVASIVE MITRAL VALVE REPAIR (MVR);  Surgeon: Rexene Alberts, MD;  Location: [facility];  Service: Open Heart Surgery;  Laterality: Right;  . TRANSESOPHAGEAL ECHOCARDIOGRAM  7/12  . US ECHOCARDIOGRAPHY  6/12  . VENTRICULOPERITONEAL SHUNT  1971    Allergies  Allergen Reactions  . Compazine Other (See Comments)    "eyes get buggy"    Outpatient Encounter Medications as of 06/25/2019  Medication Sig  . atorvastatin (LIPITOR) 20 MG tablet Take 20 mg by mouth daily.   . bisacodyl (DULCOLAX) 10 MG suppository Constipation (2 of 4): If not relieved by MOM, give 10 mg Bisacodyl suppositiory rectally X 1 dose in 24 hours as needed (Do not use constipation standing orders for residents with renal failure/CFR less than 30. Contact MD for orders)  . cholecalciferol (VITAMIN D) 1000 units tablet Take 1,000 Units by mouth daily.  . clonazePAM (KLONOPIN) 0.25 MG disintegrating tablet Take 1 tablet (0.25 mg total) by mouth 2 (two) times daily.  Marland Kitchen escitalopram (LEXAPRO) 10 MG tablet Take 1 tablet (10 mg total) by mouth daily.  Marland Kitchen linaclotide (LINZESS) 145 MCG CAPS capsule TAKE ONE CAPSULE BY MOUTH DAILY BEFORE BREAKFAST  . loratadine (CLARITIN) 10 MG tablet Take 10 mg by  mouth daily.  . Menthol, Topical Analgesic, (BIOFREEZE) 4 % GEL Apply 1 application topically 2 (two) times daily. Apply topically BID to sore areas - back  . mirabegron ER (MYRBETRIQ) 50 MG TB24 tablet Take 50 mg by mouth daily.  . NON FORMULARY Diet: MECHANCIAL CHOPPED DIET prunes with breakfast and prefers wheat bread Prefers Decaf only Coffee  . NON FORMULARY GIVE PRUNE JUICE 120ML BY MOUTH EVERY OTHER DAY AT 10AM FOR CONSTIPATION  . polyvinyl alcohol (LIQUIFILM TEARS)  1.4 % ophthalmic solution Place 1 drop into both eyes 4 (four) times daily.   . psyllium (METAMUCIL) 58.6 % packet Take 1 packet by mouth every Monday, Wednesday, and Friday.  . Sodium Phosphates (RA SALINE ENEMA RE) Constipation (3 of 4): If not relieved by Biscodyl suppository, give disposable Saline Enema rectally X 1 dose/24 hrs as needed (Do not use constipation standing orders for residents with renal failure/CFR less than 30. Contact MD for orders)  . White Petrolatum-Mineral Oil (LUBRIFRESH P.M. OP) Apply 1 application to eye at bedtime. Apply to right eye   No facility-administered encounter medications on file as of 06/25/2019.     Review of Systems  GENERAL: No change in appetite, no fatigue, no weight changes, no fever, chills or weakness MOUTH and THROAT: Denies oral discomfort, gingival pain or bleeding RESPIRATORY: no cough, SOB, DOE, wheezing, hemoptysis CARDIAC: No chest pain, edema or palpitations GI: No abdominal pain, diarrhea, constipation, heart burn, nausea or vomiting GU: Denies dysuria, frequency, hematuria or discharge NEUROLOGICAL: Denies dizziness, syncope, numbness, or headache PSYCHIATRIC: Denies feelings of depression or anxiety. No report of hallucinations, insomnia, paranoia, or agitation    Immunization History  Administered Date(s) Administered  . Influenza Split 08/11/2012  . Influenza,inj,Quad PF,6+ Mos 05/14/2013, 07/19/2014, 06/29/2015, 07/01/2016  . Influenza-Unspecified 06/09/2017, 07/07/2018, 06/07/2019  . PPD Test 01/23/2018  . Pneumococcal Polysaccharide-23 11/23/2011  . Tdap 01/23/2018   Pertinent  Health Maintenance Due  Topic Date Due  . PAP SMEAR-Modifier  12/02/1978  . COLONOSCOPY  07/27/2014  . MAMMOGRAM  04/13/2020  . INFLUENZA VACCINE  Completed   Fall Risk  03/19/2019 10/13/2018 03/31/2018 01/22/2018 11/24/2017  Falls in the past year? 1 1 Yes Yes Yes  Number falls in past yr: 1 0 2 or more 1 2 or more  Injury with Fall? 1 0 Yes  Yes Yes  Comment - - - - -  Risk Factor Category  - - High Fall Risk - High Fall Risk  Risk for fall due to : History of fall(s);Impaired balance/gait;Impaired mobility;Medication side effect - - - -  Follow up Falls evaluation completed;Education provided;Falls prevention discussed Falls evaluation completed - - Falls evaluation completed     Vitals:   06/25/19 1029  BP: 100/64  Pulse: 66  Resp: 18  Temp: (!) 97 F (36.1 C)  TempSrc: Oral  SpO2: 94%  Weight: 84 lb (38.1 kg)  Height: 4\' 11"  (1.499 m)   Body mass index is 16.97 kg/m.  Physical Exam  GENERAL APPEARANCE:  In no acute distress.  SKIN:  Skin is warm and dry.  MOUTH and THROAT: Lips are without lesions. Oral mucosa is moist and without lesions. Tongue is normal in shape, size, and color and without lesions RESPIRATORY: Breathing is even & unlabored, BS CTAB CARDIAC: RRR, no edema GI: Abdomen soft, normal BS, no masses, no tenderness EXTREMITIES:  Able to move X 4 extremities NEUROLOGICAL: Has tremors. Speech is clear. Alert and oriented X 3.  PSYCHIATRIC:  Affect and  behavior are appropriate  Labs reviewed: Recent Labs    11/09/18  NA 140  K 4.0  CL 105  CO2 26  BUN 27*  CREATININE 0.4*  CALCIUM 8.6   Recent Labs    01/27/19  ALKPHOS 77   Recent Labs    09/11/18 11/09/18 12/02/18 12/04/18  WBC 5.4 5.7 4.8  --   NEUTROABS 3  --   --   --   HGB 11.6* 11.0* 12.0  --   HCT 35* 33* 36  --   PLT 170 190  --  167   Lab Results  Component Value Date   TSH 1.15 03/02/2015   Lab Results  Component Value Date   HGBA1C 5.5 10/11/2011   Lab Results  Component Value Date   CHOL 135 01/27/2019   HDL 42 01/27/2019   LDLCALC 85 01/27/2019   LDLDIRECT 165.0 05/14/2013   TRIG 41 01/27/2019   CHOLHDL 4 11/06/2015     Assessment/Plan  1. Tremors of nervous system -Secondary to cerebellar astrocytoma, continue clonazepam 0.25 mg 1 tab twice a day, follows up with Dr. Met[facility] Clines of [facility]urology  2. Chronic constipation -Continue Linzess 145 mcg 1 capsule daily, Metamucil fiber 1 packet in 5 ounce of water daily on MWF and prune juice 120 mL every other day at 10 AM  3. Other osteoporosis without current pathological fracture -Continue Prolia 60 milligrams/ML SQ every 6 months and vitamin D3 1000 units 1 tab daily  4. Hyperlipidemia, unspecified hyperlipidemia type Lab Results  Component Value Date   CHOL 135 01/27/2019   HDL 42 01/27/2019   LDLCALC 85 01/27/2019   LDLDIRECT 165.0 05/14/2013   TRIG 41 01/27/2019   CHOLHDL 4 11/06/2015  -Continue atorvastatin 20 mg 1 tab daily  5. Neurogenic bladder -Continue Myrbetriq ER 50 mg 1 tab daily  6.  Recurrent depression -Mood is stable, continue Escitalopram 10 mg 1 tab daily  7. Dry eyes -Denies eye pain, continue artificial tears 1.4% instill 1 drop to both eyes 4 times a day   I have filled out patient's discharge paperwork and written prescriptions.    DME provided:  None  Total discharge time: Greater than 30 minutes Greater than 50% was spent in counseling and coordination of care   Discharge time involved coordination of the discharge process with social worker and nursing staff.       Durenda Age, DNP, FNP-BC [facility] [phone] (Monday-Friday 8:00 a.m. - 5:00 p.m.) [phone] (after hours)

## 2019-06-26 DIAGNOSIS — Z5181 Encounter for therapeutic drug level monitoring: Secondary | ICD-10-CM | POA: Diagnosis not present

## 2019-06-26 DIAGNOSIS — I10 Essential (primary) hypertension: Secondary | ICD-10-CM | POA: Diagnosis not present

## 2019-06-28 DIAGNOSIS — G969 Disorder of central nervous system, unspecified: Secondary | ICD-10-CM | POA: Diagnosis not present

## 2019-06-28 DIAGNOSIS — R5381 Other malaise: Secondary | ICD-10-CM | POA: Diagnosis not present

## 2019-06-28 DIAGNOSIS — Z85841 Personal history of malignant neoplasm of brain: Secondary | ICD-10-CM | POA: Diagnosis not present

## 2019-06-28 DIAGNOSIS — N319 Neuromuscular dysfunction of bladder, unspecified: Secondary | ICD-10-CM | POA: Diagnosis not present

## 2019-06-28 DIAGNOSIS — R251 Tremor, unspecified: Secondary | ICD-10-CM | POA: Diagnosis not present

## 2019-07-03 DIAGNOSIS — I10 Essential (primary) hypertension: Secondary | ICD-10-CM | POA: Diagnosis not present

## 2019-07-03 DIAGNOSIS — R5381 Other malaise: Secondary | ICD-10-CM | POA: Diagnosis not present

## 2019-07-08 DIAGNOSIS — G252 Other specified forms of tremor: Secondary | ICD-10-CM | POA: Diagnosis not present

## 2019-07-08 DIAGNOSIS — M6281 Muscle weakness (generalized): Secondary | ICD-10-CM | POA: Diagnosis not present

## 2019-07-08 DIAGNOSIS — F411 Generalized anxiety disorder: Secondary | ICD-10-CM | POA: Diagnosis not present

## 2019-07-08 DIAGNOSIS — Z85841 Personal history of malignant neoplasm of brain: Secondary | ICD-10-CM | POA: Diagnosis not present

## 2019-07-08 DIAGNOSIS — R2689 Other abnormalities of gait and mobility: Secondary | ICD-10-CM | POA: Diagnosis not present

## 2019-07-08 DIAGNOSIS — R270 Ataxia, unspecified: Secondary | ICD-10-CM | POA: Diagnosis not present

## 2019-07-08 DIAGNOSIS — M810 Age-related osteoporosis without current pathological fracture: Secondary | ICD-10-CM | POA: Diagnosis not present

## 2019-08-02 NOTE — Progress Notes (Deleted)
Virtual Visit via Video Note The purpose of this virtual visit is to provide medical care while limiting exposure to the novel coronavirus.    Consent was obtained for video visit:  Yes.   Answered questions that patient had about telehealth interaction:  Yes.   I discussed the limitations, risks, security and privacy concerns of performing an evaluation and management service by telemedicine. I also discussed with the patient that there may be a patient responsible charge related to this service. The patient expressed understanding and agreed to proceed.  Pt location: Home Physician Location: office Name of referring provider:  Javier Glazier, MD I connected with Michaela [name] at patients initiation/request on 08/04/2019 at  9:50 AM EST by video enabled telemedicine application and verified that I am speaking with the correct person using two identifiers. 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  She is accompanied by her sister who supplements history.  UPDATE:  Current medication for tremor:  Clonazepam 0.25mg  in AM and 0.25mg  at bedtime.  Tremors are stable, slightly improved.  She notes feeling tired.  HISTORY: She was diagnosed with a cerebellar astrocytoma as a child and underwent radiation and shunt placement at age 22. She has residual spasticity, cerebellar dysfunction and neurogenic bladder. She is nonambulatory at baseline. However, she is able to transition herself from the wheelchair to the bathroom. In 2013, she underwent mitral valve repair. Since then, she reports that her shaking has gotten worse. The shaking is episodic and usually occurs in the morning when she gets up. Otherwise, she is unaware of any triggers. Nothing seems  to calm it, it just resolves spontaneously. It caused her anxiety and she was started on Lexapro, which has helped the anxiety but not the shaking. When it occurs getting out of bed, she has had falls. Otherwise, she reports no new symptoms.  MRI of the brain with and without contrast from 08/27/2018 was personally reviewed and demonstrated suboccipital craniectomy with surgical encephalomalacia, gliosis, and chronic blood products related to surgery and previous XRT, severe atrophy of the cerebellum and brainstem, and balloon fourth ventricle but not significantly increased compared to MRI from 2018 and other imaging going back to 2003.  No hydrocephalus following VP shunt decompression of lateral and third ventricles.  Past Medical History: Past Medical History:  Diagnosis Date  . Astrocytoma brain tumor ([place]) 1971   astrocytoma treated with radiation  . C. difficile colitis [id number]   complicated by sepsis/ARDS and prolonged hosp  . Depression   . Hyperlipidemia   . Mitral regurgitation 10/2011   severe, s/p min invasive repair and annuloplasty  . Mitral valve prolapse   . Neuromuscular disorder ([facility])    numbness in hands and feet  . Osteoarthritis   . Osteoporosis    DEXA @ [facility] 11/2012: -3.5, prior tx Reclast, last in 2011, rec change to Prolia  . Overactive bladder   . Pleural effusion, left 11/11/2011  . Vitamin D deficiency     Medications: Outpatient Encounter Medications as of 08/04/2019  Medication Sig  . atorvastatin (LIPITOR) 20 MG tablet Take 20 mg by mouth daily.   . bisacodyl (DULCOLAX) 10 MG suppository Constipation (2 of 4): If not relieved by MOM, give 10 mg Bisacodyl suppositiory rectally X 1 dose in  24 hours as needed (Do not use constipation standing orders for residents with renal failure/CFR less than 30. Contact MD for orders)  . cholecalciferol (VITAMIN D) 1000 units tablet Take 1,000 Units by mouth daily.  . clonazePAM (KLONOPIN) 0.25 MG disintegrating tablet  Take 1 tablet (0.25 mg total) by mouth 2 (two) times daily.  Marland Kitchen escitalopram (LEXAPRO) 10 MG tablet Take 1 tablet (10 mg total) by mouth daily.  Marland Kitchen linaclotide (LINZESS) 145 MCG CAPS capsule TAKE ONE CAPSULE BY MOUTH DAILY BEFORE BREAKFAST  . loratadine (CLARITIN) 10 MG tablet Take 10 mg by mouth daily.  . Menthol, Topical Analgesic, (BIOFREEZE) 4 % GEL Apply 1 application topically 2 (two) times daily. Apply topically BID to sore areas - back  . mirabegron ER (MYRBETRIQ) 50 MG TB24 tablet Take 50 mg by mouth daily.  . NON FORMULARY Diet: MECHANCIAL CHOPPED DIET prunes with breakfast and prefers wheat bread Prefers Decaf only Coffee  . NON FORMULARY GIVE PRUNE JUICE 120ML BY MOUTH EVERY OTHER DAY AT 10AM FOR CONSTIPATION  . polyvinyl alcohol (LIQUIFILM TEARS) 1.4 % ophthalmic solution Place 1 drop into both eyes 4 (four) times daily.   . psyllium (METAMUCIL) 58.6 % packet Take 1 packet by mouth every Monday, Wednesday, and Friday.  . Sodium Phosphates (RA SALINE ENEMA RE) Constipation (3 of 4): If not relieved by Biscodyl suppository, give disposable Saline Enema rectally X 1 dose/24 hrs as needed (Do not use constipation standing orders for residents with renal failure/CFR less than 30. Contact MD for orders)  . White Petrolatum-Mineral Oil (LUBRIFRESH P.M. OP) Apply 1 application to eye at bedtime. Apply to right eye   No facility-administered encounter medications on file as of 08/04/2019.     Allergies: Allergies  Allergen Reactions  . Compazine Other (See Comments)    "eyes get buggy"    Family History: Family History  Problem Relation Age of Onset  . Coronary artery disease Father 75       died MI  . Hyperlipidemia Mother   . Heart attack Maternal Grandfather     Social History: Social History   Socioeconomic History  . Marital status: Single    Spouse name: Not on file  . Number of children: 0  . Years of education: Not on file  . Highest education level: Not on file   Occupational History  . Occupation: disabled  Social Needs  . Financial resource strain: Not hard at all  . Food insecurity    Worry: Never true    Inability: Never true  . Transportation needs    Medical: No    Non-medical: No  Tobacco Use  . Smoking status: Former Smoker    Quit date: 09/09/1990    Years since quitting: 28.9  . Smokeless tobacco: Never Used  Substance and Sexual Activity  . Alcohol use: Not Currently    Alcohol/week: 0.0 standard drinks    Comment: rarely  . Drug use: No  . Sexual activity: Not on file  Lifestyle  . Physical activity    Days per week: 0 days    Minutes per session: 0 min  . Stress: Only a little  Relationships  . Social Herbalist on phone: Three times a week    Gets together: Three times a week    Attends religious service: Never    Active member of club or organization: Not on file    Attends meetings of clubs or organizations: Never    Relationship status:  Never married  . Intimate partner violence    Fear of current or ex partner: No    Emotionally abused: No    Physically abused: No    Forced sexual activity: No  Other Topics Concern  . Not on file  Social History Narrative   [facility] to [facility] in [facility].      Patient is right-handed. She is a permanent resident of [facility].      Observations/Objective:   *** No acute distress.  Alert and oriented.  Speech fluent and not dysarthric.  Language intact.  Left eye adducted on primary gaze, unable to abduct left eye.    Assessment and Plan:   Ataxia and Cerebellar Tremor, chronic and secondary to prior cerebellar astrocytoma and treatment.  1.  Clonazepam 0.25mg  twice daily 2.  Follow up in one year  Follow Up Instructions:    -I discussed the assessment and treatment plan with the patient. The patient was provided an opportunity to ask questions and all were answered. The patient agreed with the plan and demonstrated an  understanding of the instructions.   The patient was advised to call back or seek an in-person evaluation if the symptoms worsen or if the condition fails to improve as anticipated.    Total Time spent in visit with the patient was:  *** Dudley Major, DO

## 2019-08-04 ENCOUNTER — Other Ambulatory Visit: Payer: Self-pay

## 2019-08-04 ENCOUNTER — Telehealth: Payer: Medicare Other | Admitting: Neurology

## 2019-08-10 DIAGNOSIS — Z85841 Personal history of malignant neoplasm of brain: Secondary | ICD-10-CM | POA: Diagnosis not present

## 2019-08-10 DIAGNOSIS — R2689 Other abnormalities of gait and mobility: Secondary | ICD-10-CM | POA: Diagnosis not present

## 2019-08-10 DIAGNOSIS — R41841 Cognitive communication deficit: Secondary | ICD-10-CM | POA: Diagnosis not present

## 2019-08-10 DIAGNOSIS — R270 Ataxia, unspecified: Secondary | ICD-10-CM | POA: Diagnosis not present

## 2019-08-10 DIAGNOSIS — M810 Age-related osteoporosis without current pathological fracture: Secondary | ICD-10-CM | POA: Diagnosis not present

## 2019-08-10 DIAGNOSIS — R471 Dysarthria and anarthria: Secondary | ICD-10-CM | POA: Diagnosis not present

## 2019-08-10 DIAGNOSIS — G252 Other specified forms of tremor: Secondary | ICD-10-CM | POA: Diagnosis not present

## 2019-08-10 DIAGNOSIS — R278 Other lack of coordination: Secondary | ICD-10-CM | POA: Diagnosis not present

## 2019-08-10 DIAGNOSIS — M6281 Muscle weakness (generalized): Secondary | ICD-10-CM | POA: Diagnosis not present

## 2019-08-10 DIAGNOSIS — I739 Peripheral vascular disease, unspecified: Secondary | ICD-10-CM | POA: Diagnosis not present

## 2019-08-12 DIAGNOSIS — M6281 Muscle weakness (generalized): Secondary | ICD-10-CM | POA: Diagnosis not present

## 2019-08-12 DIAGNOSIS — R278 Other lack of coordination: Secondary | ICD-10-CM | POA: Diagnosis not present

## 2019-08-12 DIAGNOSIS — G252 Other specified forms of tremor: Secondary | ICD-10-CM | POA: Diagnosis not present

## 2019-08-12 DIAGNOSIS — Z85841 Personal history of malignant neoplasm of brain: Secondary | ICD-10-CM | POA: Diagnosis not present

## 2019-08-12 DIAGNOSIS — R2689 Other abnormalities of gait and mobility: Secondary | ICD-10-CM | POA: Diagnosis not present

## 2019-08-12 DIAGNOSIS — M810 Age-related osteoporosis without current pathological fracture: Secondary | ICD-10-CM | POA: Diagnosis not present

## 2019-08-12 DIAGNOSIS — R270 Ataxia, unspecified: Secondary | ICD-10-CM | POA: Diagnosis not present

## 2019-08-12 DIAGNOSIS — R41841 Cognitive communication deficit: Secondary | ICD-10-CM | POA: Diagnosis not present

## 2019-08-13 DIAGNOSIS — R278 Other lack of coordination: Secondary | ICD-10-CM | POA: Diagnosis not present

## 2019-08-13 DIAGNOSIS — R2689 Other abnormalities of gait and mobility: Secondary | ICD-10-CM | POA: Diagnosis not present

## 2019-08-13 DIAGNOSIS — R41841 Cognitive communication deficit: Secondary | ICD-10-CM | POA: Diagnosis not present

## 2019-08-13 DIAGNOSIS — M6281 Muscle weakness (generalized): Secondary | ICD-10-CM | POA: Diagnosis not present

## 2019-08-13 DIAGNOSIS — G252 Other specified forms of tremor: Secondary | ICD-10-CM | POA: Diagnosis not present

## 2019-08-13 DIAGNOSIS — R270 Ataxia, unspecified: Secondary | ICD-10-CM | POA: Diagnosis not present

## 2019-08-13 DIAGNOSIS — M810 Age-related osteoporosis without current pathological fracture: Secondary | ICD-10-CM | POA: Diagnosis not present

## 2019-08-13 DIAGNOSIS — Z85841 Personal history of malignant neoplasm of brain: Secondary | ICD-10-CM | POA: Diagnosis not present

## 2019-08-16 DIAGNOSIS — R278 Other lack of coordination: Secondary | ICD-10-CM | POA: Diagnosis not present

## 2019-08-16 DIAGNOSIS — M6281 Muscle weakness (generalized): Secondary | ICD-10-CM | POA: Diagnosis not present

## 2019-08-16 DIAGNOSIS — Z85841 Personal history of malignant neoplasm of brain: Secondary | ICD-10-CM | POA: Diagnosis not present

## 2019-08-16 DIAGNOSIS — R270 Ataxia, unspecified: Secondary | ICD-10-CM | POA: Diagnosis not present

## 2019-08-16 DIAGNOSIS — R41841 Cognitive communication deficit: Secondary | ICD-10-CM | POA: Diagnosis not present

## 2019-08-16 DIAGNOSIS — R2689 Other abnormalities of gait and mobility: Secondary | ICD-10-CM | POA: Diagnosis not present

## 2019-08-16 DIAGNOSIS — G252 Other specified forms of tremor: Secondary | ICD-10-CM | POA: Diagnosis not present

## 2019-08-16 DIAGNOSIS — M810 Age-related osteoporosis without current pathological fracture: Secondary | ICD-10-CM | POA: Diagnosis not present

## 2019-08-18 DIAGNOSIS — R2689 Other abnormalities of gait and mobility: Secondary | ICD-10-CM | POA: Diagnosis not present

## 2019-08-18 DIAGNOSIS — G252 Other specified forms of tremor: Secondary | ICD-10-CM | POA: Diagnosis not present

## 2019-08-18 DIAGNOSIS — Z85841 Personal history of malignant neoplasm of brain: Secondary | ICD-10-CM | POA: Diagnosis not present

## 2019-08-18 DIAGNOSIS — R270 Ataxia, unspecified: Secondary | ICD-10-CM | POA: Diagnosis not present

## 2019-08-18 DIAGNOSIS — R278 Other lack of coordination: Secondary | ICD-10-CM | POA: Diagnosis not present

## 2019-08-18 DIAGNOSIS — M810 Age-related osteoporosis without current pathological fracture: Secondary | ICD-10-CM | POA: Diagnosis not present

## 2019-08-18 DIAGNOSIS — M6281 Muscle weakness (generalized): Secondary | ICD-10-CM | POA: Diagnosis not present

## 2019-08-18 DIAGNOSIS — R41841 Cognitive communication deficit: Secondary | ICD-10-CM | POA: Diagnosis not present

## 2019-08-20 DIAGNOSIS — R41841 Cognitive communication deficit: Secondary | ICD-10-CM | POA: Diagnosis not present

## 2019-08-20 DIAGNOSIS — R278 Other lack of coordination: Secondary | ICD-10-CM | POA: Diagnosis not present

## 2019-08-20 DIAGNOSIS — R2689 Other abnormalities of gait and mobility: Secondary | ICD-10-CM | POA: Diagnosis not present

## 2019-08-20 DIAGNOSIS — M810 Age-related osteoporosis without current pathological fracture: Secondary | ICD-10-CM | POA: Diagnosis not present

## 2019-08-20 DIAGNOSIS — R270 Ataxia, unspecified: Secondary | ICD-10-CM | POA: Diagnosis not present

## 2019-08-20 DIAGNOSIS — G252 Other specified forms of tremor: Secondary | ICD-10-CM | POA: Diagnosis not present

## 2019-08-20 DIAGNOSIS — Z85841 Personal history of malignant neoplasm of brain: Secondary | ICD-10-CM | POA: Diagnosis not present

## 2019-08-20 DIAGNOSIS — M6281 Muscle weakness (generalized): Secondary | ICD-10-CM | POA: Diagnosis not present

## 2019-08-26 DIAGNOSIS — R270 Ataxia, unspecified: Secondary | ICD-10-CM | POA: Diagnosis not present

## 2019-08-26 DIAGNOSIS — R278 Other lack of coordination: Secondary | ICD-10-CM | POA: Diagnosis not present

## 2019-08-26 DIAGNOSIS — R41841 Cognitive communication deficit: Secondary | ICD-10-CM | POA: Diagnosis not present

## 2019-08-26 DIAGNOSIS — M810 Age-related osteoporosis without current pathological fracture: Secondary | ICD-10-CM | POA: Diagnosis not present

## 2019-08-26 DIAGNOSIS — M6281 Muscle weakness (generalized): Secondary | ICD-10-CM | POA: Diagnosis not present

## 2019-08-26 DIAGNOSIS — Z85841 Personal history of malignant neoplasm of brain: Secondary | ICD-10-CM | POA: Diagnosis not present

## 2019-08-26 DIAGNOSIS — R2689 Other abnormalities of gait and mobility: Secondary | ICD-10-CM | POA: Diagnosis not present

## 2019-08-26 DIAGNOSIS — G252 Other specified forms of tremor: Secondary | ICD-10-CM | POA: Diagnosis not present

## 2019-08-27 DIAGNOSIS — M6281 Muscle weakness (generalized): Secondary | ICD-10-CM | POA: Diagnosis not present

## 2019-08-27 DIAGNOSIS — R2689 Other abnormalities of gait and mobility: Secondary | ICD-10-CM | POA: Diagnosis not present

## 2019-08-27 DIAGNOSIS — R270 Ataxia, unspecified: Secondary | ICD-10-CM | POA: Diagnosis not present

## 2019-08-27 DIAGNOSIS — M810 Age-related osteoporosis without current pathological fracture: Secondary | ICD-10-CM | POA: Diagnosis not present

## 2019-08-27 DIAGNOSIS — G252 Other specified forms of tremor: Secondary | ICD-10-CM | POA: Diagnosis not present

## 2019-08-27 DIAGNOSIS — R278 Other lack of coordination: Secondary | ICD-10-CM | POA: Diagnosis not present

## 2019-08-27 DIAGNOSIS — Z85841 Personal history of malignant neoplasm of brain: Secondary | ICD-10-CM | POA: Diagnosis not present

## 2019-08-27 DIAGNOSIS — R41841 Cognitive communication deficit: Secondary | ICD-10-CM | POA: Diagnosis not present

## 2019-08-30 DIAGNOSIS — M810 Age-related osteoporosis without current pathological fracture: Secondary | ICD-10-CM | POA: Diagnosis not present

## 2019-08-30 DIAGNOSIS — R2689 Other abnormalities of gait and mobility: Secondary | ICD-10-CM | POA: Diagnosis not present

## 2019-08-30 DIAGNOSIS — R270 Ataxia, unspecified: Secondary | ICD-10-CM | POA: Diagnosis not present

## 2019-08-30 DIAGNOSIS — R251 Tremor, unspecified: Secondary | ICD-10-CM | POA: Diagnosis not present

## 2019-08-30 DIAGNOSIS — R278 Other lack of coordination: Secondary | ICD-10-CM | POA: Diagnosis not present

## 2019-08-30 DIAGNOSIS — R41841 Cognitive communication deficit: Secondary | ICD-10-CM | POA: Diagnosis not present

## 2019-08-30 DIAGNOSIS — G252 Other specified forms of tremor: Secondary | ICD-10-CM | POA: Diagnosis not present

## 2019-08-30 DIAGNOSIS — M6281 Muscle weakness (generalized): Secondary | ICD-10-CM | POA: Diagnosis not present

## 2019-08-30 DIAGNOSIS — G969 Disorder of central nervous system, unspecified: Secondary | ICD-10-CM | POA: Diagnosis not present

## 2019-08-30 DIAGNOSIS — Z85841 Personal history of malignant neoplasm of brain: Secondary | ICD-10-CM | POA: Diagnosis not present

## 2019-08-31 DIAGNOSIS — G252 Other specified forms of tremor: Secondary | ICD-10-CM | POA: Diagnosis not present

## 2019-08-31 DIAGNOSIS — R270 Ataxia, unspecified: Secondary | ICD-10-CM | POA: Diagnosis not present

## 2019-08-31 DIAGNOSIS — M6281 Muscle weakness (generalized): Secondary | ICD-10-CM | POA: Diagnosis not present

## 2019-08-31 DIAGNOSIS — R2689 Other abnormalities of gait and mobility: Secondary | ICD-10-CM | POA: Diagnosis not present

## 2019-08-31 DIAGNOSIS — Z85841 Personal history of malignant neoplasm of brain: Secondary | ICD-10-CM | POA: Diagnosis not present

## 2019-08-31 DIAGNOSIS — M810 Age-related osteoporosis without current pathological fracture: Secondary | ICD-10-CM | POA: Diagnosis not present

## 2019-08-31 DIAGNOSIS — R278 Other lack of coordination: Secondary | ICD-10-CM | POA: Diagnosis not present

## 2019-08-31 DIAGNOSIS — R41841 Cognitive communication deficit: Secondary | ICD-10-CM | POA: Diagnosis not present

## 2019-09-01 DIAGNOSIS — M810 Age-related osteoporosis without current pathological fracture: Secondary | ICD-10-CM | POA: Diagnosis not present

## 2019-09-01 DIAGNOSIS — R2689 Other abnormalities of gait and mobility: Secondary | ICD-10-CM | POA: Diagnosis not present

## 2019-09-01 DIAGNOSIS — Z85841 Personal history of malignant neoplasm of brain: Secondary | ICD-10-CM | POA: Diagnosis not present

## 2019-09-01 DIAGNOSIS — R270 Ataxia, unspecified: Secondary | ICD-10-CM | POA: Diagnosis not present

## 2019-09-01 DIAGNOSIS — M6281 Muscle weakness (generalized): Secondary | ICD-10-CM | POA: Diagnosis not present

## 2019-09-01 DIAGNOSIS — R41841 Cognitive communication deficit: Secondary | ICD-10-CM | POA: Diagnosis not present

## 2019-09-01 DIAGNOSIS — G252 Other specified forms of tremor: Secondary | ICD-10-CM | POA: Diagnosis not present

## 2019-09-01 DIAGNOSIS — R278 Other lack of coordination: Secondary | ICD-10-CM | POA: Diagnosis not present

## 2019-09-06 DIAGNOSIS — R2689 Other abnormalities of gait and mobility: Secondary | ICD-10-CM | POA: Diagnosis not present

## 2019-09-06 DIAGNOSIS — R278 Other lack of coordination: Secondary | ICD-10-CM | POA: Diagnosis not present

## 2019-09-06 DIAGNOSIS — R41841 Cognitive communication deficit: Secondary | ICD-10-CM | POA: Diagnosis not present

## 2019-09-06 DIAGNOSIS — M810 Age-related osteoporosis without current pathological fracture: Secondary | ICD-10-CM | POA: Diagnosis not present

## 2019-09-06 DIAGNOSIS — M6281 Muscle weakness (generalized): Secondary | ICD-10-CM | POA: Diagnosis not present

## 2019-09-06 DIAGNOSIS — Z85841 Personal history of malignant neoplasm of brain: Secondary | ICD-10-CM | POA: Diagnosis not present

## 2019-09-06 DIAGNOSIS — R270 Ataxia, unspecified: Secondary | ICD-10-CM | POA: Diagnosis not present

## 2019-09-06 DIAGNOSIS — G252 Other specified forms of tremor: Secondary | ICD-10-CM | POA: Diagnosis not present

## 2019-09-07 DIAGNOSIS — Z23 Encounter for immunization: Secondary | ICD-10-CM | POA: Diagnosis not present

## 2019-09-24 NOTE — Progress Notes (Signed)
Virtual Visit via Video Note The purpose of this virtual visit is to provide medical care while limiting exposure to the novel coronavirus.    Consent was obtained for video visit:  Yes.   Answered questions that patient had about telehealth interaction:  Yes.   I discussed the limitations, risks, security and privacy concerns of performing an evaluation and management service by telemedicine. I also discussed with the patient that there may be a patient responsible charge related to this service. The patient expressed understanding and agreed to proceed.  Pt location: Home Physician Location: office Name of referring provider:  Javier Glazier, MD I connected with Michaela [name] at patients initiation/request on 09/27/2019 at  9:50 AM EST by video enabled telemedicine application and verified that I am speaking with the correct person using two identifiers. Pt MRN:  [id number] Pt DOB:  Jun 05, 1956 Video Participants:  Michaela [name]   History of Present Illness:  Michaela [name] is a 64 year old woman with depression, anxiety, major neurocognitive disorder, neurogenic bladder and history of cerebellar astrocytoma who follows up for cerebellar tremor and ataxia.  She is accompanied by her nurse who supplements history.  UPDATE: Current management:  Lexapro 10mg   She stopped taking clonazepam over the weekend because it doesn't help and it was causing vivid dreams.    HISTORY: She was diagnosed with a cerebellar astrocytoma as a child and underwent radiation and shunt placement at age 71. She has residual spasticity, cerebellar dysfunction and neurogenic bladder. She is nonambulatory at baseline. However, she is able to transition herself from the wheelchair to the bathroom. In 2013, she underwent mitral valve repair. Since then, she reports that her shaking has gotten worse. The shaking is episodic and usually occurs in the morning when she gets up. Otherwise, she is  unaware of any triggers. Nothing seems to calm it, it just resolves spontaneously. It caused her anxiety and she was started on Lexapro, which has helped the anxiety but not the shaking. When it occurs getting out of bed, she has had falls. Otherwise, she reports no new symptoms.  Because she endorsed worsening tremor, MRI of the brain with and without contrast from 08/27/2018 was performed and demonstrated suboccipital craniectomy with surgical encephalomalacia, gliosis, and chronic blood products related to surgery and previous XRT, severe atrophy of the cerebellum and brainstem, and balloon fourth ventricle but not significantly increased compared to MRI from 2018 and other imaging going back to 2003.  No hydrocephalus following VP shunt decompression of lateral and third ventricles.  Past Medical History: Past Medical History:  Diagnosis Date  . Astrocytoma brain tumor ([place]) 1971   astrocytoma treated with radiation  . C. difficile colitis [id number]   complicated by sepsis/ARDS and prolonged hosp  . Depression   . Hyperlipidemia   . Mitral regurgitation 10/2011   severe, s/p min invasive repair and annuloplasty  . Mitral valve prolapse   . Neuromuscular disorder (HCC)    numbness in hands and feet  . Osteoarthritis   . Osteoporosis    DEXA @ [facility] 11/2012: -3.5, prior tx Reclast, last in 2011, rec change to Prolia  . Overactive bladder   . Pleural effusion, left 11/11/2011  . Vitamin D deficiency     Medications: Outpatient Encounter Medications as of 09/27/2019  Medication Sig  . atorvastatin (LIPITOR) 20 MG tablet Take 20 mg by mouth daily.   . bisacodyl (DULCOLAX) 10 MG suppository Constipation (2 of 4): If not relieved by MOM, give 10  mg Bisacodyl suppositiory rectally X 1 dose in 24 hours as needed (Do not use constipation standing orders for residents with renal failure/CFR less than 30. Contact MD for orders)  . cholecalciferol (VITAMIN D) 1000 units tablet Take 1,000 Units by  mouth daily.  . clonazePAM (KLONOPIN) 0.25 MG disintegrating tablet Take 1 tablet (0.25 mg total) by mouth 2 (two) times daily.  Marland Kitchen escitalopram (LEXAPRO) 10 MG tablet Take 1 tablet (10 mg total) by mouth daily.  Marland Kitchen linaclotide (LINZESS) 145 MCG CAPS capsule TAKE ONE CAPSULE BY MOUTH DAILY BEFORE BREAKFAST  . loratadine (CLARITIN) 10 MG tablet Take 10 mg by mouth daily.  . Menthol, Topical Analgesic, (BIOFREEZE) 4 % GEL Apply 1 application topically 2 (two) times daily. Apply topically BID to sore areas - back  . mirabegron ER (MYRBETRIQ) 50 MG TB24 tablet Take 50 mg by mouth daily.  . NON FORMULARY Diet: MECHANCIAL CHOPPED DIET prunes with breakfast and prefers wheat bread Prefers Decaf only Coffee  . NON FORMULARY GIVE PRUNE JUICE 120ML BY MOUTH EVERY OTHER DAY AT 10AM FOR CONSTIPATION  . polyvinyl alcohol (LIQUIFILM TEARS) 1.4 % ophthalmic solution Place 1 drop into both eyes 4 (four) times daily.   . psyllium (METAMUCIL) 58.6 % packet Take 1 packet by mouth every Monday, Wednesday, and Friday.  . Sodium Phosphates (RA SALINE ENEMA RE) Constipation (3 of 4): If not relieved by Biscodyl suppository, give disposable Saline Enema rectally X 1 dose/24 hrs as needed (Do not use constipation standing orders for residents with renal failure/CFR less than 30. Contact MD for orders)  . White Petrolatum-Mineral Oil (LUBRIFRESH P.M. OP) Apply 1 application to eye at bedtime. Apply to right eye   No facility-administered encounter medications on file as of 09/27/2019.    Allergies: Allergies  Allergen Reactions  . Compazine Other (See Comments)    "eyes get buggy"    Family History: Family History  Problem Relation Age of Onset  . Coronary artery disease Father 1       died MI  . Hyperlipidemia Mother   . Heart attack Maternal Grandfather     Social History: Social History   Socioeconomic History  . Marital status: Single    Spouse name: Not on file  . Number of children: 0  . Years of  education: Not on file  . Highest education level: Not on file  Occupational History  . Occupation: disabled  Tobacco Use  . Smoking status: Former Smoker    Quit date: 09/09/1990    Years since quitting: 29.0  . Smokeless tobacco: Never Used  Substance and Sexual Activity  . Alcohol use: Not Currently    Alcohol/week: 0.0 standard drinks    Comment: rarely  . Drug use: No  . Sexual activity: Not on file  Other Topics Concern  . Not on file  Social History Narrative   [facility] transferred [facility] to [facility] in SN[facility].      Patient is right-handed. She is a permanent resident of [facility].     Social Determinants of Health   Financial Resource Strain:   . Difficulty of Paying Living Expenses: Not on file  Food Insecurity:   . Worried About Charity fundraiser in the Last Year: Not on file  . Ran Out of Food in the Last Year: Not on file  Transportation Needs:   . Lack of Transportation (Medical): Not on file  . Lack of Transportation (Non-Medical): Not on file  Physical Activity:   .  Days of Exercise per Week: Not on file  . Minutes of Exercise per Session: Not on file  Stress:   . Feeling of Stress : Not on file  Social Connections:   . Frequency of Communication with Friends and Family: Not on file  . Frequency of Social Gatherings with Friends and Family: Not on file  . Attends Religious Services: Not on file  . Active Member of Clubs or Organizations: Not on file  . Attends Archivist Meetings: Not on file  . Marital Status: Not on file  Intimate Partner Violence:   . Fear of Current or Ex-Partner: Not on file  . Emotionally Abused: Not on file  . Physically Abused: Not on file  . Sexually Abused: Not on file    Observations/Objective:   Blood pressure 116/67, pulse (!) 101, temperature (!) 96.9 F (36.1 C), resp. rate (!) 23, height 4\' 11"  (1.499 m), weight 93 lb (42.2 kg), SpO2 93 %. No acute distress.  Alert and oriented.  Speech  fluent and not dysarthric.  Language intact.  Eyes orthophoric on primary gaze.  Face symmetric.  Assessment and Plan:   1.  Ataxia and cerebellar tremor, chronic and secondary to prior cerebellar astrocytoma and subsequent treatment.  Unfortunately, these symptoms are very difficult to treat and control.  As she has developed adverse reaction to clonazepam, I agree with discontinuing it.  Also, given her underlying neurocognitive disorder, benzodiazepines may just increase her confusion. 2.  Neurocognitive disorder  1.  Follow up in one year.  Follow Up Instructions:    -I discussed the assessment and treatment plan with the patient. The patient was provided an opportunity to ask questions and all were answered. The patient agreed with the plan and demonstrated an understanding of the instructions.   The patient was advised to call back or seek an in-person evaluation if the symptoms worsen or if the condition fails to improve as anticipated.    Total Time spent in visit with the patient was:  9 minutes  Dudley Major, DO

## 2019-09-27 ENCOUNTER — Other Ambulatory Visit: Payer: Self-pay

## 2019-09-27 ENCOUNTER — Encounter: Payer: Self-pay | Admitting: Neurology

## 2019-09-27 ENCOUNTER — Telehealth (INDEPENDENT_AMBULATORY_CARE_PROVIDER_SITE_OTHER): Payer: Medicare Other | Admitting: Neurology

## 2019-09-27 VITALS — BP 116/67 | HR 101 | Temp 96.9°F | Resp 23 | Ht 59.0 in | Wt 93.0 lb

## 2019-09-27 DIAGNOSIS — G3184 Mild cognitive impairment, so stated: Secondary | ICD-10-CM | POA: Diagnosis not present

## 2019-09-27 DIAGNOSIS — G252 Other specified forms of tremor: Secondary | ICD-10-CM | POA: Diagnosis not present

## 2019-09-27 DIAGNOSIS — C719 Malignant neoplasm of brain, unspecified: Secondary | ICD-10-CM

## 2019-09-28 DIAGNOSIS — G252 Other specified forms of tremor: Secondary | ICD-10-CM | POA: Diagnosis not present

## 2019-09-28 DIAGNOSIS — F411 Generalized anxiety disorder: Secondary | ICD-10-CM | POA: Diagnosis not present

## 2019-10-05 DIAGNOSIS — Z23 Encounter for immunization: Secondary | ICD-10-CM | POA: Diagnosis not present

## 2019-10-12 DIAGNOSIS — G252 Other specified forms of tremor: Secondary | ICD-10-CM | POA: Diagnosis not present

## 2019-10-12 DIAGNOSIS — F411 Generalized anxiety disorder: Secondary | ICD-10-CM | POA: Diagnosis not present

## 2019-10-13 DIAGNOSIS — Z85841 Personal history of malignant neoplasm of brain: Secondary | ICD-10-CM | POA: Diagnosis not present

## 2019-10-13 DIAGNOSIS — G252 Other specified forms of tremor: Secondary | ICD-10-CM | POA: Diagnosis not present

## 2019-10-13 DIAGNOSIS — R270 Ataxia, unspecified: Secondary | ICD-10-CM | POA: Diagnosis not present

## 2019-10-13 DIAGNOSIS — R293 Abnormal posture: Secondary | ICD-10-CM | POA: Diagnosis not present

## 2019-10-13 DIAGNOSIS — M6281 Muscle weakness (generalized): Secondary | ICD-10-CM | POA: Diagnosis not present

## 2019-10-13 DIAGNOSIS — R2681 Unsteadiness on feet: Secondary | ICD-10-CM | POA: Diagnosis not present

## 2019-10-14 DIAGNOSIS — R2681 Unsteadiness on feet: Secondary | ICD-10-CM | POA: Diagnosis not present

## 2019-10-14 DIAGNOSIS — R270 Ataxia, unspecified: Secondary | ICD-10-CM | POA: Diagnosis not present

## 2019-10-14 DIAGNOSIS — G252 Other specified forms of tremor: Secondary | ICD-10-CM | POA: Diagnosis not present

## 2019-10-14 DIAGNOSIS — Z85841 Personal history of malignant neoplasm of brain: Secondary | ICD-10-CM | POA: Diagnosis not present

## 2019-10-14 DIAGNOSIS — M6281 Muscle weakness (generalized): Secondary | ICD-10-CM | POA: Diagnosis not present

## 2019-10-14 DIAGNOSIS — R293 Abnormal posture: Secondary | ICD-10-CM | POA: Diagnosis not present

## 2019-10-19 DIAGNOSIS — G252 Other specified forms of tremor: Secondary | ICD-10-CM | POA: Diagnosis not present

## 2019-10-19 DIAGNOSIS — R270 Ataxia, unspecified: Secondary | ICD-10-CM | POA: Diagnosis not present

## 2019-10-19 DIAGNOSIS — R293 Abnormal posture: Secondary | ICD-10-CM | POA: Diagnosis not present

## 2019-10-19 DIAGNOSIS — M6281 Muscle weakness (generalized): Secondary | ICD-10-CM | POA: Diagnosis not present

## 2019-10-19 DIAGNOSIS — R2681 Unsteadiness on feet: Secondary | ICD-10-CM | POA: Diagnosis not present

## 2019-10-19 DIAGNOSIS — Z85841 Personal history of malignant neoplasm of brain: Secondary | ICD-10-CM | POA: Diagnosis not present

## 2019-10-20 DIAGNOSIS — G252 Other specified forms of tremor: Secondary | ICD-10-CM | POA: Diagnosis not present

## 2019-10-20 DIAGNOSIS — R270 Ataxia, unspecified: Secondary | ICD-10-CM | POA: Diagnosis not present

## 2019-10-20 DIAGNOSIS — R2681 Unsteadiness on feet: Secondary | ICD-10-CM | POA: Diagnosis not present

## 2019-10-20 DIAGNOSIS — M6281 Muscle weakness (generalized): Secondary | ICD-10-CM | POA: Diagnosis not present

## 2019-10-20 DIAGNOSIS — R293 Abnormal posture: Secondary | ICD-10-CM | POA: Diagnosis not present

## 2019-10-20 DIAGNOSIS — Z85841 Personal history of malignant neoplasm of brain: Secondary | ICD-10-CM | POA: Diagnosis not present

## 2019-10-21 DIAGNOSIS — M6281 Muscle weakness (generalized): Secondary | ICD-10-CM | POA: Diagnosis not present

## 2019-10-21 DIAGNOSIS — R270 Ataxia, unspecified: Secondary | ICD-10-CM | POA: Diagnosis not present

## 2019-10-21 DIAGNOSIS — G252 Other specified forms of tremor: Secondary | ICD-10-CM | POA: Diagnosis not present

## 2019-10-21 DIAGNOSIS — R293 Abnormal posture: Secondary | ICD-10-CM | POA: Diagnosis not present

## 2019-10-21 DIAGNOSIS — Z85841 Personal history of malignant neoplasm of brain: Secondary | ICD-10-CM | POA: Diagnosis not present

## 2019-10-21 DIAGNOSIS — R2681 Unsteadiness on feet: Secondary | ICD-10-CM | POA: Diagnosis not present

## 2019-10-22 DIAGNOSIS — I739 Peripheral vascular disease, unspecified: Secondary | ICD-10-CM | POA: Diagnosis not present

## 2019-10-22 DIAGNOSIS — F339 Major depressive disorder, recurrent, unspecified: Secondary | ICD-10-CM | POA: Diagnosis not present

## 2019-10-22 DIAGNOSIS — C719 Malignant neoplasm of brain, unspecified: Secondary | ICD-10-CM | POA: Diagnosis not present

## 2019-10-22 DIAGNOSIS — R270 Ataxia, unspecified: Secondary | ICD-10-CM | POA: Diagnosis not present

## 2019-10-25 DIAGNOSIS — G252 Other specified forms of tremor: Secondary | ICD-10-CM | POA: Diagnosis not present

## 2019-10-25 DIAGNOSIS — M6281 Muscle weakness (generalized): Secondary | ICD-10-CM | POA: Diagnosis not present

## 2019-10-25 DIAGNOSIS — Z85841 Personal history of malignant neoplasm of brain: Secondary | ICD-10-CM | POA: Diagnosis not present

## 2019-10-25 DIAGNOSIS — R270 Ataxia, unspecified: Secondary | ICD-10-CM | POA: Diagnosis not present

## 2019-10-25 DIAGNOSIS — R2681 Unsteadiness on feet: Secondary | ICD-10-CM | POA: Diagnosis not present

## 2019-10-25 DIAGNOSIS — R293 Abnormal posture: Secondary | ICD-10-CM | POA: Diagnosis not present

## 2019-10-26 DIAGNOSIS — F411 Generalized anxiety disorder: Secondary | ICD-10-CM | POA: Diagnosis not present

## 2019-10-26 DIAGNOSIS — G252 Other specified forms of tremor: Secondary | ICD-10-CM | POA: Diagnosis not present

## 2019-10-26 DIAGNOSIS — F4320 Adjustment disorder, unspecified: Secondary | ICD-10-CM | POA: Diagnosis not present

## 2019-10-27 DIAGNOSIS — M6281 Muscle weakness (generalized): Secondary | ICD-10-CM | POA: Diagnosis not present

## 2019-10-27 DIAGNOSIS — R2681 Unsteadiness on feet: Secondary | ICD-10-CM | POA: Diagnosis not present

## 2019-10-27 DIAGNOSIS — R293 Abnormal posture: Secondary | ICD-10-CM | POA: Diagnosis not present

## 2019-10-27 DIAGNOSIS — R270 Ataxia, unspecified: Secondary | ICD-10-CM | POA: Diagnosis not present

## 2019-10-27 DIAGNOSIS — Z85841 Personal history of malignant neoplasm of brain: Secondary | ICD-10-CM | POA: Diagnosis not present

## 2019-10-27 DIAGNOSIS — G252 Other specified forms of tremor: Secondary | ICD-10-CM | POA: Diagnosis not present

## 2019-10-29 DIAGNOSIS — M6281 Muscle weakness (generalized): Secondary | ICD-10-CM | POA: Diagnosis not present

## 2019-10-29 DIAGNOSIS — Z85841 Personal history of malignant neoplasm of brain: Secondary | ICD-10-CM | POA: Diagnosis not present

## 2019-10-29 DIAGNOSIS — R270 Ataxia, unspecified: Secondary | ICD-10-CM | POA: Diagnosis not present

## 2019-10-29 DIAGNOSIS — G252 Other specified forms of tremor: Secondary | ICD-10-CM | POA: Diagnosis not present

## 2019-10-29 DIAGNOSIS — R2681 Unsteadiness on feet: Secondary | ICD-10-CM | POA: Diagnosis not present

## 2019-10-29 DIAGNOSIS — R293 Abnormal posture: Secondary | ICD-10-CM | POA: Diagnosis not present

## 2019-11-01 DIAGNOSIS — R2681 Unsteadiness on feet: Secondary | ICD-10-CM | POA: Diagnosis not present

## 2019-11-01 DIAGNOSIS — R270 Ataxia, unspecified: Secondary | ICD-10-CM | POA: Diagnosis not present

## 2019-11-01 DIAGNOSIS — M6281 Muscle weakness (generalized): Secondary | ICD-10-CM | POA: Diagnosis not present

## 2019-11-01 DIAGNOSIS — R293 Abnormal posture: Secondary | ICD-10-CM | POA: Diagnosis not present

## 2019-11-01 DIAGNOSIS — Z85841 Personal history of malignant neoplasm of brain: Secondary | ICD-10-CM | POA: Diagnosis not present

## 2019-11-01 DIAGNOSIS — G252 Other specified forms of tremor: Secondary | ICD-10-CM | POA: Diagnosis not present

## 2019-11-03 DIAGNOSIS — Z85841 Personal history of malignant neoplasm of brain: Secondary | ICD-10-CM | POA: Diagnosis not present

## 2019-11-03 DIAGNOSIS — G252 Other specified forms of tremor: Secondary | ICD-10-CM | POA: Diagnosis not present

## 2019-11-03 DIAGNOSIS — R2681 Unsteadiness on feet: Secondary | ICD-10-CM | POA: Diagnosis not present

## 2019-11-03 DIAGNOSIS — R293 Abnormal posture: Secondary | ICD-10-CM | POA: Diagnosis not present

## 2019-11-03 DIAGNOSIS — R270 Ataxia, unspecified: Secondary | ICD-10-CM | POA: Diagnosis not present

## 2019-11-03 DIAGNOSIS — M6281 Muscle weakness (generalized): Secondary | ICD-10-CM | POA: Diagnosis not present

## 2019-11-05 DIAGNOSIS — R2681 Unsteadiness on feet: Secondary | ICD-10-CM | POA: Diagnosis not present

## 2019-11-05 DIAGNOSIS — Z85841 Personal history of malignant neoplasm of brain: Secondary | ICD-10-CM | POA: Diagnosis not present

## 2019-11-05 DIAGNOSIS — R293 Abnormal posture: Secondary | ICD-10-CM | POA: Diagnosis not present

## 2019-11-05 DIAGNOSIS — M6281 Muscle weakness (generalized): Secondary | ICD-10-CM | POA: Diagnosis not present

## 2019-11-05 DIAGNOSIS — R270 Ataxia, unspecified: Secondary | ICD-10-CM | POA: Diagnosis not present

## 2019-11-05 DIAGNOSIS — G252 Other specified forms of tremor: Secondary | ICD-10-CM | POA: Diagnosis not present

## 2019-11-08 DIAGNOSIS — G252 Other specified forms of tremor: Secondary | ICD-10-CM | POA: Diagnosis not present

## 2019-11-08 DIAGNOSIS — Z85841 Personal history of malignant neoplasm of brain: Secondary | ICD-10-CM | POA: Diagnosis not present

## 2019-11-08 DIAGNOSIS — M6281 Muscle weakness (generalized): Secondary | ICD-10-CM | POA: Diagnosis not present

## 2019-11-08 DIAGNOSIS — R293 Abnormal posture: Secondary | ICD-10-CM | POA: Diagnosis not present

## 2019-11-08 DIAGNOSIS — R270 Ataxia, unspecified: Secondary | ICD-10-CM | POA: Diagnosis not present

## 2019-11-08 DIAGNOSIS — R2681 Unsteadiness on feet: Secondary | ICD-10-CM | POA: Diagnosis not present

## 2019-11-09 DIAGNOSIS — I10 Essential (primary) hypertension: Secondary | ICD-10-CM | POA: Diagnosis not present

## 2019-11-24 DIAGNOSIS — F419 Anxiety disorder, unspecified: Secondary | ICD-10-CM | POA: Diagnosis not present

## 2019-11-24 DIAGNOSIS — R270 Ataxia, unspecified: Secondary | ICD-10-CM | POA: Diagnosis not present

## 2019-11-24 DIAGNOSIS — M545 Low back pain: Secondary | ICD-10-CM | POA: Diagnosis not present

## 2020-01-04 DIAGNOSIS — E785 Hyperlipidemia, unspecified: Secondary | ICD-10-CM | POA: Diagnosis not present

## 2020-01-04 DIAGNOSIS — F329 Major depressive disorder, single episode, unspecified: Secondary | ICD-10-CM | POA: Diagnosis not present

## 2020-01-04 DIAGNOSIS — R52 Pain, unspecified: Secondary | ICD-10-CM | POA: Diagnosis not present

## 2020-01-04 DIAGNOSIS — R1310 Dysphagia, unspecified: Secondary | ICD-10-CM | POA: Diagnosis not present

## 2020-01-04 DIAGNOSIS — Z85841 Personal history of malignant neoplasm of brain: Secondary | ICD-10-CM | POA: Diagnosis not present

## 2020-01-04 DIAGNOSIS — I340 Nonrheumatic mitral (valve) insufficiency: Secondary | ICD-10-CM | POA: Diagnosis not present

## 2020-01-04 DIAGNOSIS — N3281 Overactive bladder: Secondary | ICD-10-CM | POA: Diagnosis not present

## 2020-01-04 DIAGNOSIS — G4700 Insomnia, unspecified: Secondary | ICD-10-CM | POA: Diagnosis not present

## 2020-01-04 DIAGNOSIS — K219 Gastro-esophageal reflux disease without esophagitis: Secondary | ICD-10-CM | POA: Diagnosis not present

## 2020-01-04 DIAGNOSIS — M810 Age-related osteoporosis without current pathological fracture: Secondary | ICD-10-CM | POA: Diagnosis not present

## 2020-01-04 DIAGNOSIS — J302 Other seasonal allergic rhinitis: Secondary | ICD-10-CM | POA: Diagnosis not present

## 2020-01-18 DIAGNOSIS — F411 Generalized anxiety disorder: Secondary | ICD-10-CM | POA: Diagnosis not present

## 2020-01-18 DIAGNOSIS — G252 Other specified forms of tremor: Secondary | ICD-10-CM | POA: Diagnosis not present

## 2020-02-18 DIAGNOSIS — F419 Anxiety disorder, unspecified: Secondary | ICD-10-CM | POA: Diagnosis not present

## 2020-02-18 DIAGNOSIS — G969 Disorder of central nervous system, unspecified: Secondary | ICD-10-CM | POA: Diagnosis not present

## 2020-02-18 DIAGNOSIS — R251 Tremor, unspecified: Secondary | ICD-10-CM | POA: Diagnosis not present

## 2020-02-18 DIAGNOSIS — R270 Ataxia, unspecified: Secondary | ICD-10-CM | POA: Diagnosis not present

## 2020-02-18 DIAGNOSIS — Z85841 Personal history of malignant neoplasm of brain: Secondary | ICD-10-CM | POA: Diagnosis not present

## 2020-02-18 DIAGNOSIS — E785 Hyperlipidemia, unspecified: Secondary | ICD-10-CM | POA: Diagnosis not present

## 2020-03-24 DIAGNOSIS — R1312 Dysphagia, oropharyngeal phase: Secondary | ICD-10-CM | POA: Diagnosis not present

## 2020-03-24 DIAGNOSIS — R471 Dysarthria and anarthria: Secondary | ICD-10-CM | POA: Diagnosis not present

## 2020-03-24 DIAGNOSIS — R270 Ataxia, unspecified: Secondary | ICD-10-CM | POA: Diagnosis not present

## 2020-03-24 DIAGNOSIS — I739 Peripheral vascular disease, unspecified: Secondary | ICD-10-CM | POA: Diagnosis not present

## 2020-03-28 DIAGNOSIS — R270 Ataxia, unspecified: Secondary | ICD-10-CM | POA: Diagnosis not present

## 2020-03-28 DIAGNOSIS — R1312 Dysphagia, oropharyngeal phase: Secondary | ICD-10-CM | POA: Diagnosis not present

## 2020-03-28 DIAGNOSIS — R471 Dysarthria and anarthria: Secondary | ICD-10-CM | POA: Diagnosis not present

## 2020-03-28 DIAGNOSIS — I739 Peripheral vascular disease, unspecified: Secondary | ICD-10-CM | POA: Diagnosis not present

## 2020-03-29 DIAGNOSIS — I739 Peripheral vascular disease, unspecified: Secondary | ICD-10-CM | POA: Diagnosis not present

## 2020-03-29 DIAGNOSIS — R471 Dysarthria and anarthria: Secondary | ICD-10-CM | POA: Diagnosis not present

## 2020-03-29 DIAGNOSIS — R270 Ataxia, unspecified: Secondary | ICD-10-CM | POA: Diagnosis not present

## 2020-03-29 DIAGNOSIS — R1312 Dysphagia, oropharyngeal phase: Secondary | ICD-10-CM | POA: Diagnosis not present

## 2020-03-31 DIAGNOSIS — R1312 Dysphagia, oropharyngeal phase: Secondary | ICD-10-CM | POA: Diagnosis not present

## 2020-03-31 DIAGNOSIS — R270 Ataxia, unspecified: Secondary | ICD-10-CM | POA: Diagnosis not present

## 2020-03-31 DIAGNOSIS — I739 Peripheral vascular disease, unspecified: Secondary | ICD-10-CM | POA: Diagnosis not present

## 2020-03-31 DIAGNOSIS — R471 Dysarthria and anarthria: Secondary | ICD-10-CM | POA: Diagnosis not present

## 2020-04-03 DIAGNOSIS — F419 Anxiety disorder, unspecified: Secondary | ICD-10-CM | POA: Diagnosis not present

## 2020-04-03 DIAGNOSIS — M545 Low back pain: Secondary | ICD-10-CM | POA: Diagnosis not present

## 2020-04-03 DIAGNOSIS — Z79899 Other long term (current) drug therapy: Secondary | ICD-10-CM | POA: Diagnosis not present

## 2020-04-03 DIAGNOSIS — R270 Ataxia, unspecified: Secondary | ICD-10-CM | POA: Diagnosis not present

## 2020-04-03 DIAGNOSIS — R410 Disorientation, unspecified: Secondary | ICD-10-CM | POA: Diagnosis not present

## 2020-04-04 DIAGNOSIS — R270 Ataxia, unspecified: Secondary | ICD-10-CM | POA: Diagnosis not present

## 2020-04-04 DIAGNOSIS — R1312 Dysphagia, oropharyngeal phase: Secondary | ICD-10-CM | POA: Diagnosis not present

## 2020-04-04 DIAGNOSIS — I739 Peripheral vascular disease, unspecified: Secondary | ICD-10-CM | POA: Diagnosis not present

## 2020-04-04 DIAGNOSIS — R471 Dysarthria and anarthria: Secondary | ICD-10-CM | POA: Diagnosis not present

## 2020-04-10 DIAGNOSIS — R5381 Other malaise: Secondary | ICD-10-CM | POA: Diagnosis not present

## 2020-04-12 DIAGNOSIS — G930 Cerebral cysts: Secondary | ICD-10-CM | POA: Diagnosis not present

## 2020-04-12 DIAGNOSIS — I6782 Cerebral ischemia: Secondary | ICD-10-CM | POA: Diagnosis not present

## 2020-04-12 DIAGNOSIS — G9389 Other specified disorders of brain: Secondary | ICD-10-CM | POA: Diagnosis not present

## 2020-04-12 DIAGNOSIS — R410 Disorientation, unspecified: Secondary | ICD-10-CM | POA: Diagnosis not present

## 2020-04-12 DIAGNOSIS — R270 Ataxia, unspecified: Secondary | ICD-10-CM | POA: Diagnosis not present

## 2020-04-12 DIAGNOSIS — G319 Degenerative disease of nervous system, unspecified: Secondary | ICD-10-CM | POA: Diagnosis not present

## 2020-05-05 DIAGNOSIS — Z85841 Personal history of malignant neoplasm of brain: Secondary | ICD-10-CM | POA: Diagnosis not present

## 2020-05-05 DIAGNOSIS — N3281 Overactive bladder: Secondary | ICD-10-CM | POA: Diagnosis not present

## 2020-05-05 DIAGNOSIS — J302 Other seasonal allergic rhinitis: Secondary | ICD-10-CM | POA: Diagnosis not present

## 2020-05-05 DIAGNOSIS — F329 Major depressive disorder, single episode, unspecified: Secondary | ICD-10-CM | POA: Diagnosis not present

## 2020-05-05 DIAGNOSIS — R52 Pain, unspecified: Secondary | ICD-10-CM | POA: Diagnosis not present

## 2020-05-05 DIAGNOSIS — R5381 Other malaise: Secondary | ICD-10-CM | POA: Diagnosis not present

## 2020-05-05 DIAGNOSIS — E785 Hyperlipidemia, unspecified: Secondary | ICD-10-CM | POA: Diagnosis not present

## 2020-05-05 DIAGNOSIS — I340 Nonrheumatic mitral (valve) insufficiency: Secondary | ICD-10-CM | POA: Diagnosis not present

## 2020-05-19 DIAGNOSIS — Z5181 Encounter for therapeutic drug level monitoring: Secondary | ICD-10-CM | POA: Diagnosis not present

## 2020-05-19 DIAGNOSIS — R5381 Other malaise: Secondary | ICD-10-CM | POA: Diagnosis not present

## 2020-05-19 DIAGNOSIS — I10 Essential (primary) hypertension: Secondary | ICD-10-CM | POA: Diagnosis not present

## 2020-05-24 DIAGNOSIS — M79601 Pain in right arm: Secondary | ICD-10-CM | POA: Diagnosis not present

## 2020-05-24 DIAGNOSIS — G252 Other specified forms of tremor: Secondary | ICD-10-CM | POA: Diagnosis not present

## 2020-05-24 DIAGNOSIS — Z993 Dependence on wheelchair: Secondary | ICD-10-CM | POA: Diagnosis not present

## 2020-05-24 DIAGNOSIS — R32 Unspecified urinary incontinence: Secondary | ICD-10-CM | POA: Diagnosis not present

## 2020-05-24 DIAGNOSIS — M542 Cervicalgia: Secondary | ICD-10-CM | POA: Diagnosis not present

## 2020-05-24 DIAGNOSIS — R270 Ataxia, unspecified: Secondary | ICD-10-CM | POA: Diagnosis not present

## 2020-05-24 DIAGNOSIS — R531 Weakness: Secondary | ICD-10-CM | POA: Diagnosis not present

## 2020-05-24 DIAGNOSIS — Z982 Presence of cerebrospinal fluid drainage device: Secondary | ICD-10-CM | POA: Diagnosis not present

## 2020-05-24 DIAGNOSIS — G919 Hydrocephalus, unspecified: Secondary | ICD-10-CM | POA: Diagnosis not present

## 2020-06-07 DIAGNOSIS — G919 Hydrocephalus, unspecified: Secondary | ICD-10-CM | POA: Diagnosis not present

## 2020-06-23 DIAGNOSIS — R270 Ataxia, unspecified: Secondary | ICD-10-CM | POA: Diagnosis not present

## 2020-06-23 DIAGNOSIS — F339 Major depressive disorder, recurrent, unspecified: Secondary | ICD-10-CM | POA: Diagnosis not present

## 2020-06-23 DIAGNOSIS — G912 (Idiopathic) normal pressure hydrocephalus: Secondary | ICD-10-CM | POA: Diagnosis not present

## 2020-06-23 DIAGNOSIS — R5381 Other malaise: Secondary | ICD-10-CM | POA: Diagnosis not present

## 2020-06-27 DIAGNOSIS — F411 Generalized anxiety disorder: Secondary | ICD-10-CM | POA: Diagnosis not present

## 2020-06-27 DIAGNOSIS — G252 Other specified forms of tremor: Secondary | ICD-10-CM | POA: Diagnosis not present

## 2020-06-28 DIAGNOSIS — R300 Dysuria: Secondary | ICD-10-CM | POA: Diagnosis not present

## 2020-06-28 DIAGNOSIS — I10 Essential (primary) hypertension: Secondary | ICD-10-CM | POA: Diagnosis not present

## 2020-06-30 DIAGNOSIS — R5381 Other malaise: Secondary | ICD-10-CM | POA: Diagnosis not present

## 2020-06-30 DIAGNOSIS — R300 Dysuria: Secondary | ICD-10-CM | POA: Diagnosis not present

## 2020-07-14 DIAGNOSIS — Z982 Presence of cerebrospinal fluid drainage device: Secondary | ICD-10-CM | POA: Diagnosis not present

## 2020-07-14 DIAGNOSIS — Z85841 Personal history of malignant neoplasm of brain: Secondary | ICD-10-CM | POA: Diagnosis not present

## 2020-07-14 DIAGNOSIS — Z9889 Other specified postprocedural states: Secondary | ICD-10-CM | POA: Diagnosis not present

## 2020-07-14 DIAGNOSIS — R251 Tremor, unspecified: Secondary | ICD-10-CM | POA: Diagnosis not present

## 2020-07-14 DIAGNOSIS — M542 Cervicalgia: Secondary | ICD-10-CM | POA: Diagnosis not present

## 2020-07-14 DIAGNOSIS — R270 Ataxia, unspecified: Secondary | ICD-10-CM | POA: Diagnosis not present

## 2020-07-14 DIAGNOSIS — M79601 Pain in right arm: Secondary | ICD-10-CM | POA: Diagnosis not present

## 2020-07-14 DIAGNOSIS — R32 Unspecified urinary incontinence: Secondary | ICD-10-CM | POA: Diagnosis not present

## 2020-07-14 DIAGNOSIS — G912 (Idiopathic) normal pressure hydrocephalus: Secondary | ICD-10-CM | POA: Diagnosis not present

## 2020-08-22 DIAGNOSIS — G252 Other specified forms of tremor: Secondary | ICD-10-CM | POA: Diagnosis not present

## 2020-08-22 DIAGNOSIS — F411 Generalized anxiety disorder: Secondary | ICD-10-CM | POA: Diagnosis not present

## 2020-08-23 DIAGNOSIS — R300 Dysuria: Secondary | ICD-10-CM | POA: Diagnosis not present

## 2020-08-31 ENCOUNTER — Emergency Department (HOSPITAL_BASED_OUTPATIENT_CLINIC_OR_DEPARTMENT_OTHER)
Admission: EM | Admit: 2020-08-31 | Discharge: 2020-08-31 | Disposition: A | Discharge status: Home / Self Care | Payer: Medicare Other | Attending: Emergency Medicine | Admitting: Emergency Medicine

## 2020-08-31 ENCOUNTER — Encounter (HOSPITAL_BASED_OUTPATIENT_CLINIC_OR_DEPARTMENT_OTHER): Payer: Self-pay

## 2020-08-31 ENCOUNTER — Other Ambulatory Visit: Payer: Self-pay

## 2020-08-31 ENCOUNTER — Emergency Department (HOSPITAL_BASED_OUTPATIENT_CLINIC_OR_DEPARTMENT_OTHER): Payer: Medicare Other

## 2020-08-31 DIAGNOSIS — Z87891 Personal history of nicotine dependence: Secondary | ICD-10-CM | POA: Diagnosis not present

## 2020-08-31 DIAGNOSIS — S0083XA Contusion of other part of head, initial encounter: Secondary | ICD-10-CM | POA: Insufficient documentation

## 2020-08-31 DIAGNOSIS — F29 Unspecified psychosis not due to a substance or known physiological condition: Secondary | ICD-10-CM | POA: Diagnosis not present

## 2020-08-31 DIAGNOSIS — W19XXXA Unspecified fall, initial encounter: Secondary | ICD-10-CM

## 2020-08-31 DIAGNOSIS — S0990XA Unspecified injury of head, initial encounter: Secondary | ICD-10-CM | POA: Diagnosis present

## 2020-08-31 DIAGNOSIS — I5032 Chronic diastolic (congestive) heart failure: Secondary | ICD-10-CM | POA: Diagnosis not present

## 2020-08-31 DIAGNOSIS — Z79899 Other long term (current) drug therapy: Secondary | ICD-10-CM | POA: Diagnosis not present

## 2020-08-31 DIAGNOSIS — W050XXA Fall from non-moving wheelchair, initial encounter: Secondary | ICD-10-CM | POA: Insufficient documentation

## 2020-08-31 DIAGNOSIS — R0902 Hypoxemia: Secondary | ICD-10-CM | POA: Diagnosis not present

## 2020-08-31 DIAGNOSIS — I959 Hypotension, unspecified: Secondary | ICD-10-CM | POA: Diagnosis not present

## 2020-08-31 DIAGNOSIS — Z7401 Bed confinement status: Secondary | ICD-10-CM | POA: Diagnosis not present

## 2020-08-31 DIAGNOSIS — R519 Headache, unspecified: Secondary | ICD-10-CM | POA: Diagnosis not present

## 2020-08-31 DIAGNOSIS — M2550 Pain in unspecified joint: Secondary | ICD-10-CM | POA: Diagnosis not present

## 2020-08-31 DIAGNOSIS — R4182 Altered mental status, unspecified: Secondary | ICD-10-CM | POA: Diagnosis not present

## 2020-08-31 NOTE — ED Notes (Signed)
Patient transported to CT 

## 2020-08-31 NOTE — ED Notes (Signed)
Pt departs ED with PTAR at this time

## 2020-08-31 NOTE — ED Notes (Signed)
Pt has hx brain neoplasm, responses delayed at baseline, pt responds appropriately, moves all extremities.  2cm area bruising and swelling visible right forehead.  Pt denies pain at this time.

## 2020-08-31 NOTE — ED Provider Notes (Signed)
[facility] EMERG[facility]Y DEPARTMENT Provider Note   CSN: [id number] Arrival date & time: 08/31/20  [place]     History Chief Complaint  Patient presents with   Michaela [name] is a 64 y.o. female.  History of astrocytoma, neurocognitive impairments, resides in nursing facility.  Witnessed fall, no loss of consciousness, patient leaned forward from wheelchair and hit her forehead.  No laceration, not on blood thinners.  Patient states that she has no acute pain at this time.  Sister at bedside provided additional history.  No other concerns at this time.  HPI     Past Medical History:  Diagnosis Date   Astrocytoma brain tumor ([place]) 1971   astrocytoma treated with radiation   C. difficile colitis 11/7046   complicated by sepsis/ARDS and prolonged hosp   Depression    Hyperlipidemia    Mitral regurgitation 10/2011   severe, s/p min invasive repair and annuloplasty   Mitral valve prolapse    Neuromuscular disorder (HCC)    numbness in hands and feet   Osteoarthritis    Osteoporosis    DEXA @ [facility] 11/2012: -3.5, prior tx Reclast, last in 2011, rec change to Prolia   Overactive bladder    Pleural effusion, left 11/11/2011   Vitamin D deficiency     Patient Active Problem List   Diagnosis Date Noted   Dry eyes 06/25/2019   Tremors of nervous system 05/24/2019   Overactive bladder    Rash 01/01/2019   Neurocognitive deficits 09/03/2018   Swallowing dysfunction 05/07/2018   Bilateral shoulder pain 01/06/2017   Chronic constipation 03/21/2016   Loss of weight    Vitamin D deficiency    Osteoporosis    Hyperlipidemia    Paroxysmal atrial fibrillation ([place]) 03/03/2012   Pituitary disorder ([place]) 02/11/2012   Ataxia 01/29/2012   Normochromic normocytic anemia 88/91/6945   Diastolic CHF, chronic (HCC) 11/13/2011   Pleural effusion, left 11/11/2011   S/P mitral valve repair 10/15/2011   Brain tumor, astrocytoma ([place]) 10/15/2011    Neuromuscular disorder ([place]) 10/15/2011   Neurogenic bladder 10/15/2011   Arthritis 10/15/2011   Mitral regurgitation due to cusp prolapse 07/08/2011    Past Surgical History:  Procedure Laterality Date   CARDIAC CATHETERIZATION     Jan 2013   KNEE ARTHROSCOPY Right    MITRAL VALVE REPAIR  10/15/2011   Procedure: MINIMALLY INVASIVE MITRAL VALVE REPAIR (MVR);  Surgeon: Rexene Alberts, MD;  Location: [facility];  Service: Open Heart Surgery;  Laterality: Right;   TRANSESOPHAGEAL ECHOCARDIOGRAM  7/12   US ECHOCARDIOGRAPHY  6/12   VENTRICULOPERITONEAL SHUNT  1971     OB History   No obstetric history on file.     Family History  Problem Relation Age of Onset   Coronary artery disease Father 16       died MI   Hyperlipidemia Mother    Heart attack Maternal Grandfather     Social History   Tobacco Use   Smoking status: Former Smoker    Quit date: 09/09/1990    Years since quitting: 29.9   Smokeless tobacco: Never Used  Vaping Use   Vaping Use: Never used  Substance Use Topics   Alcohol use: Not Currently    Alcohol/week: 0.0 standard drinks    Comment: rarely   Drug use: No    Home Medications Prior to Admission medications   Medication Sig Start Date End Date Taking? Authorizing Provider  atorvastatin (LIPITOR) 20 MG tablet Take  20 mg by mouth daily.    Yes [provider]  cholecalciferol (VITAMIN D) 1000 units tablet Take 1,000 Units by mouth daily.   Yes [provider]  clonazePAM (KLONOPIN) 0.25 MG disintegrating tablet Take 1 tablet (0.25 mg total) by mouth 2 (two) times daily. 06/09/19  Yes Medina-Vargas, Monina C, NP  Cranberry 125 MG TABS Take by mouth.   Yes [provider]  Docusate Sodium (COLACE PO) Take by mouth.   Yes [provider]  escitalopram (LEXAPRO) 10 MG tablet Take 1 tablet (10 mg total) by mouth daily. 01/06/17  Yes Martinique, Betty G, MD  polyvinyl alcohol (LIQUIFILM TEARS) 1.4 % ophthalmic  solution Place 1 drop into both eyes 4 (four) times daily.    Yes [provider]  bisacodyl (DULCOLAX) 10 MG suppository Constipation (2 of 4): If not relieved by MOM, give 10 mg Bisacodyl suppositiory rectally X 1 dose in 24 hours as needed (Do not use constipation standing orders for residents with renal failure/CFR less than 30. Contact MD for orders)    [provider]  linaclotide Rolan Lipa) 145 MCG CAPS capsule TAKE ONE CAPSULE BY MOUTH DAILY BEFORE BREAKFAST Patient not taking: Reported on 09/27/2019 01/06/17   Martinique, Betty G, MD  loratadine (CLARITIN) 10 MG tablet Take 10 mg by mouth daily.    [provider]  Menthol, Topical Analgesic, (BIOFREEZE) 4 % GEL Apply 1 application topically 2 (two) times daily. Apply topically BID to sore areas - back    [provider]  sulfamethoxazole-trimethoprim (BACTRIM DS) 800-160 MG tablet Take 1 tablet by mouth 2 (two) times daily. 08/28/20   [provider]  White Petrolatum-Mineral Oil ([place] P.M. OP) Apply 1 application to eye at bedtime. Apply to right eye    [provider]    Allergies    Compazine, Prochlorperazine, Prochlorperazine edisylate, and Prochlorperazine maleate  Review of Systems   Review of Systems  Constitutional: Negative for chills and fever.  HENT: Negative for ear pain and sore throat.   Eyes: Negative for pain and visual disturbance.  Respiratory: Negative for cough and shortness of breath.   Cardiovascular: Negative for chest pain and palpitations.  Gastrointestinal: Negative for abdominal pain and vomiting.  Genitourinary: Negative for dysuria and hematuria.  Musculoskeletal: Negative for arthralgias and back pain.  Skin: Negative for color change and rash.  Neurological: Negative for seizures and syncope.  All other systems reviewed and are negative.   Physical Exam Updated Vital Signs BP (!) 102/59    Pulse 88    Temp 98.1 F (36.7 C) (Oral)    Resp 16     Ht 4\' 11"  (1.499 m)    Wt 42 kg    SpO2 95%    BMI 18.70 kg/m   Physical Exam Vitals and nursing note reviewed.  Constitutional:      General: She is not in acute distress.    Appearance: She is well-developed and well-nourished.     Comments: Chronically ill appearing but no distress  HENT:     Head: Normocephalic.     Comments: Forehead hematoma on right forehead, no active bleeding, no laceration; orbit is unaffected Eyes:     Conjunctiva/sclera: Conjunctivae normal.  Cardiovascular:     Rate and Rhythm: Normal rate and regular rhythm.     Heart sounds: No murmur heard.   Pulmonary:     Effort: Pulmonary effort is normal. No respiratory distress.     Breath sounds: Normal breath sounds.  Abdominal:     Palpations: Abdomen is soft.     Tenderness: There is no abdominal tenderness.  Musculoskeletal:        General: No edema.     Cervical back: Neck supple.  Skin:    General: Skin is warm and dry.  Neurological:     Mental Status: She is alert.  Psychiatric:        Mood and Affect: Mood and affect normal.     ED Results / Procedures / Treatments   Labs (all labs ordered are listed, but only abnormal results are displayed) Labs Reviewed - No data to display  EKG None  Radiology CT Head Wo Contrast  Result Date: 08/31/2020 CLINICAL DATA:  Head trauma, severe headache. Remote history of a treated cerebellar astrocytoma. EXAM: CT HEAD WITHOUT CONTRAST TECHNIQUE: Contiguous axial images were obtained from the base of the skull through the vertex without intravenous contrast. COMPARISON:  Head MRI 04/12/2020 and CT 03/03/2017 FINDINGS: Brain: Sequelae of suboccipital craniectomy are again identified. There is unchanged extensive cerebellar encephalomalacia and gliosis with associated dystrophic appearing calcification and cystic dilatation of the fourth ventricle. A right occipital approach ventriculostomy catheter terminates in the posterior body of the right lateral  ventricle. The lateral and third ventricles are larger than on the 2018 CT but unchanged in size from the more recent MRI. Small chronic bilateral parieto-occipital infarcts are again noted, left larger than right. Patchy hypodensities in the cerebral white matter bilaterally are nonspecific but may reflect a combination of post radiation changes and chronic small vessel ischemic disease. Moderate cerebral atrophy is advanced for age. No acute infarct, intracranial hemorrhage, mass, midline shift, or extra-axial fluid collection is identified. Vascular: Calcified atherosclerosis at the skull base. No hyperdense vessel. Skull: Suboccipital craniectomy. No acute fracture or suspicious osseous lesion. Sinuses/Orbits: Visualized paranasal sinuses and mastoid air cells are clear. Visualized orbits are unremarkable. Other: None. IMPRESSION: 1. No evidence of acute intracranial abnormality. 2. Unchanged size and configuration of the ventricles with ventriculostomy catheter in place. 3. Stable post treatment changes in the posterior fossa. Electronically Signed   By: Logan Bores M.D.   On: 08/31/2020 20:21    Procedures Procedures (including critical care time)  Medications Ordered in ED Medications - No data to display  ED Course  I have reviewed the triage vital signs and the nursing notes.  Pertinent labs & imaging results that were available during my care of the patient were reviewed by me and considered in my medical decision making (see chart for details).    MDM Rules/Calculators/A&P                         64 year old lady presents from nursing facility after fall.  Mechanical, witnessed.  Well-appearing, isolated trauma noted to forehead.  CT negative.  DC back to facility.   After the discussed management above, the patient was determined to be safe for discharge.  The patient was in agreement with this plan and all questions regarding their care were answered.  ED return precautions were  discussed and the patient will return to the ED with any significant worsening of condition.  Final Clinical Impression(s) / ED Diagnoses Final diagnoses:  Fall, initial encounter  Traumatic hematoma of forehead, initial encounter    Rx / DC Orders ED Discharge Orders    None       Lucrezia Starch, MD 08/31/20 2051

## 2020-08-31 NOTE — ED Triage Notes (Addendum)
Pt arrives via [facility]s from [facility], was in wheelchair, leaned forward, pitched forward, bruising and swelling to right forehead, no blood thinners. Denies LOC, staff denies LOC.  Per staff, pt at baseline.  Sent by provider from SNF for eval.

## 2020-08-31 NOTE — ED Notes (Signed)
[facility] called for transport to [facility] facility

## 2020-08-31 NOTE — Discharge Instructions (Addendum)
Return if you develop worsening headache, any vomiting, lethargy or confusion.

## 2020-09-26 ENCOUNTER — Ambulatory Visit: Payer: Medicare Other | Admitting: Neurology

## 2022-09-05 ENCOUNTER — Encounter (HOSPITAL_COMMUNITY): Payer: Self-pay

## 2022-09-05 ENCOUNTER — Other Ambulatory Visit: Payer: Self-pay

## 2022-09-05 ENCOUNTER — Inpatient Hospital Stay (HOSPITAL_COMMUNITY)
Admission: EM | Admit: 2022-09-05 | Discharge: 2022-09-13 | DRG: 871 | Disposition: A | Discharge status: Skilled Nursing Facility | Payer: Medicare Other | Source: Skilled Nursing Facility | Attending: Internal Medicine | Admitting: Internal Medicine

## 2022-09-05 ENCOUNTER — Emergency Department (HOSPITAL_COMMUNITY): Payer: Medicare Other

## 2022-09-05 DIAGNOSIS — R6520 Severe sepsis without septic shock: Secondary | ICD-10-CM | POA: Diagnosis present

## 2022-09-05 DIAGNOSIS — N319 Neuromuscular dysfunction of bladder, unspecified: Secondary | ICD-10-CM | POA: Diagnosis present

## 2022-09-05 DIAGNOSIS — G8250 Quadriplegia, unspecified: Secondary | ICD-10-CM | POA: Diagnosis present

## 2022-09-05 DIAGNOSIS — I4891 Unspecified atrial fibrillation: Secondary | ICD-10-CM | POA: Diagnosis not present

## 2022-09-05 DIAGNOSIS — I4819 Other persistent atrial fibrillation: Secondary | ICD-10-CM | POA: Diagnosis present

## 2022-09-05 DIAGNOSIS — I959 Hypotension, unspecified: Secondary | ICD-10-CM | POA: Diagnosis present

## 2022-09-05 DIAGNOSIS — I513 Intracardiac thrombosis, not elsewhere classified: Secondary | ICD-10-CM

## 2022-09-05 DIAGNOSIS — Z87891 Personal history of nicotine dependence: Secondary | ICD-10-CM | POA: Diagnosis not present

## 2022-09-05 DIAGNOSIS — I340 Nonrheumatic mitral (valve) insufficiency: Secondary | ICD-10-CM | POA: Diagnosis not present

## 2022-09-05 DIAGNOSIS — E872 Acidosis, unspecified: Secondary | ICD-10-CM | POA: Diagnosis present

## 2022-09-05 DIAGNOSIS — I749 Embolism and thrombosis of unspecified artery: Secondary | ICD-10-CM

## 2022-09-05 DIAGNOSIS — Z66 Do not resuscitate: Secondary | ICD-10-CM | POA: Diagnosis present

## 2022-09-05 DIAGNOSIS — Z923 Personal history of irradiation: Secondary | ICD-10-CM | POA: Diagnosis not present

## 2022-09-05 DIAGNOSIS — A419 Sepsis, unspecified organism: Secondary | ICD-10-CM | POA: Diagnosis not present

## 2022-09-05 DIAGNOSIS — Z8249 Family history of ischemic heart disease and other diseases of the circulatory system: Secondary | ICD-10-CM

## 2022-09-05 DIAGNOSIS — D735 Infarction of spleen: Secondary | ICD-10-CM | POA: Diagnosis present

## 2022-09-05 DIAGNOSIS — Z85841 Personal history of malignant neoplasm of brain: Secondary | ICD-10-CM

## 2022-09-05 DIAGNOSIS — Z87440 Personal history of urinary (tract) infections: Secondary | ICD-10-CM

## 2022-09-05 DIAGNOSIS — J210 Acute bronchiolitis due to respiratory syncytial virus: Secondary | ICD-10-CM | POA: Diagnosis not present

## 2022-09-05 DIAGNOSIS — M810 Age-related osteoporosis without current pathological fracture: Secondary | ICD-10-CM | POA: Diagnosis present

## 2022-09-05 DIAGNOSIS — Z7901 Long term (current) use of anticoagulants: Secondary | ICD-10-CM | POA: Diagnosis not present

## 2022-09-05 DIAGNOSIS — Z79899 Other long term (current) drug therapy: Secondary | ICD-10-CM

## 2022-09-05 DIAGNOSIS — J9601 Acute respiratory failure with hypoxia: Secondary | ICD-10-CM | POA: Diagnosis present

## 2022-09-05 DIAGNOSIS — Z982 Presence of cerebrospinal fluid drainage device: Secondary | ICD-10-CM

## 2022-09-05 DIAGNOSIS — I052 Rheumatic mitral stenosis with insufficiency: Secondary | ICD-10-CM | POA: Diagnosis present

## 2022-09-05 DIAGNOSIS — N3281 Overactive bladder: Secondary | ICD-10-CM | POA: Diagnosis present

## 2022-09-05 DIAGNOSIS — Z954 Presence of other heart-valve replacement: Secondary | ICD-10-CM

## 2022-09-05 DIAGNOSIS — J121 Respiratory syncytial virus pneumonia: Secondary | ICD-10-CM | POA: Diagnosis present

## 2022-09-05 DIAGNOSIS — E785 Hyperlipidemia, unspecified: Secondary | ICD-10-CM | POA: Diagnosis present

## 2022-09-05 DIAGNOSIS — Z7401 Bed confinement status: Secondary | ICD-10-CM

## 2022-09-05 DIAGNOSIS — Z1152 Encounter for screening for COVID-19: Secondary | ICD-10-CM

## 2022-09-05 DIAGNOSIS — Z993 Dependence on wheelchair: Secondary | ICD-10-CM | POA: Diagnosis not present

## 2022-09-05 DIAGNOSIS — I342 Nonrheumatic mitral (valve) stenosis: Secondary | ICD-10-CM | POA: Diagnosis not present

## 2022-09-05 DIAGNOSIS — I5032 Chronic diastolic (congestive) heart failure: Secondary | ICD-10-CM | POA: Diagnosis present

## 2022-09-05 DIAGNOSIS — A4189 Other specified sepsis: Secondary | ICD-10-CM | POA: Diagnosis present

## 2022-09-05 DIAGNOSIS — Z888 Allergy status to other drugs, medicaments and biological substances status: Secondary | ICD-10-CM

## 2022-09-05 DIAGNOSIS — Z83438 Family history of other disorder of lipoprotein metabolism and other lipidemia: Secondary | ICD-10-CM

## 2022-09-05 LAB — PROTIME-INR
INR: 1 (ref 0.8–1.2)
Prothrombin Time: 13.4 seconds (ref 11.4–15.2)

## 2022-09-05 LAB — I-STAT VENOUS BLOOD GAS, ED
Acid-Base Excess: 0 mmol/L (ref 0.0–2.0)
Bicarbonate: 25.4 mmol/L (ref 20.0–28.0)
Calcium, Ion: 1.06 mmol/L — ABNORMAL LOW (ref 1.15–1.40)
HCT: 34 % — ABNORMAL LOW (ref 36.0–46.0)
Hemoglobin: 11.6 g/dL — ABNORMAL LOW (ref 12.0–15.0)
O2 Saturation: 71 %
Potassium: 3.7 mmol/L (ref 3.5–5.1)
Sodium: 136 mmol/L (ref 135–145)
TCO2: 27 mmol/L (ref 22–32)
pCO2, Ven: 42.3 mmHg — ABNORMAL LOW (ref 44–60)
pH, Ven: 7.387 (ref 7.25–7.43)
pO2, Ven: 38 mmHg (ref 32–45)

## 2022-09-05 LAB — I-STAT CHEM 8, ED
BUN: 19 mg/dL (ref 8–23)
Calcium, Ion: 1.02 mmol/L — ABNORMAL LOW (ref 1.15–1.40)
Chloride: 101 mmol/L (ref 98–111)
Creatinine, Ser: 0.6 mg/dL (ref 0.44–1.00)
Glucose, Bld: 84 mg/dL (ref 70–99)
HCT: 33 % — ABNORMAL LOW (ref 36.0–46.0)
Hemoglobin: 11.2 g/dL — ABNORMAL LOW (ref 12.0–15.0)
Potassium: 3.6 mmol/L (ref 3.5–5.1)
Sodium: 136 mmol/L (ref 135–145)
TCO2: 24 mmol/L (ref 22–32)

## 2022-09-05 LAB — COMPREHENSIVE METABOLIC PANEL
ALT: 28 U/L (ref 0–44)
AST: 31 U/L (ref 15–41)
Albumin: 2.7 g/dL — ABNORMAL LOW (ref 3.5–5.0)
Alkaline Phosphatase: 64 U/L (ref 38–126)
Anion gap: 10 (ref 5–15)
BUN: 19 mg/dL (ref 8–23)
CO2: 24 mmol/L (ref 22–32)
Calcium: 8 mg/dL — ABNORMAL LOW (ref 8.9–10.3)
Chloride: 102 mmol/L (ref 98–111)
Creatinine, Ser: 0.65 mg/dL (ref 0.44–1.00)
GFR, Estimated: >60 mL/min (ref >60)
Glucose, Bld: 89 mg/dL (ref 70–99)
Potassium: 3.7 mmol/L (ref 3.5–5.1)
Sodium: 136 mmol/L (ref 135–145)
Total Bilirubin: 0.4 mg/dL (ref 0.3–1.2)
Total Protein: 5.5 g/dL — ABNORMAL LOW (ref 6.5–8.1)

## 2022-09-05 LAB — CBC WITH DIFFERENTIAL/PLATELET
Abs Immature Granulocytes: 0.05 10*3/uL (ref 0.00–0.07)
Basophils Absolute: 0 10*3/uL (ref 0.0–0.1)
Basophils Relative: 0 %
Eosinophils Absolute: 0.1 10*3/uL (ref 0.0–0.5)
Eosinophils Relative: 2 %
HCT: 36.6 % (ref 36.0–46.0)
Hemoglobin: 10.8 g/dL — ABNORMAL LOW (ref 12.0–15.0)
Immature Granulocytes: 1 %
Lymphocytes Relative: 21 %
Lymphs Abs: 2 10*3/uL (ref 0.7–4.0)
MCH: 29.3 pg (ref 26.0–34.0)
MCHC: 29.5 g/dL — ABNORMAL LOW (ref 30.0–36.0)
MCV: 99.2 fL (ref 80.0–100.0)
Monocytes Absolute: 1 10*3/uL (ref 0.1–1.0)
Monocytes Relative: 11 %
Neutro Abs: 6.2 10*3/uL (ref 1.7–7.7)
Neutrophils Relative %: 65 %
Platelets: 207 10*3/uL (ref 150–400)
RBC: 3.69 MIL/uL — ABNORMAL LOW (ref 3.87–5.11)
RDW: 13.5 % (ref 11.5–15.5)
WBC: 9.3 10*3/uL (ref 4.0–10.5)
nRBC: 0 % (ref 0.0–0.2)

## 2022-09-05 LAB — RESP PANEL BY RT-PCR (RSV, FLU A&B, COVID)  RVPGX2
Influenza A by PCR: NEGATIVE
Influenza B by PCR: NEGATIVE
Resp Syncytial Virus by PCR: POSITIVE — AB
SARS Coronavirus 2 by RT PCR: NEGATIVE

## 2022-09-05 LAB — TROPONIN I (HIGH SENSITIVITY): Troponin I (High Sensitivity): <2 ng/L (ref <18)

## 2022-09-05 LAB — LACTIC ACID, PLASMA
Lactic Acid, Venous: 1.7 mmol/L (ref 0.5–1.9)
Lactic Acid, Venous: 5.8 mmol/L (ref 0.5–1.9)

## 2022-09-05 LAB — MRSA NEXT GEN BY PCR, NASAL: MRSA by PCR Next Gen: NOT DETECTED

## 2022-09-05 LAB — CBG MONITORING, ED: Glucose-Capillary: 84 mg/dL (ref 70–99)

## 2022-09-05 LAB — APTT: aPTT: 26 seconds (ref 24–36)

## 2022-09-05 LAB — BRAIN NATRIURETIC PEPTIDE: B Natriuretic Peptide: 706.4 pg/mL — ABNORMAL HIGH (ref 0.0–100.0)

## 2022-09-05 MED ORDER — VANCOMYCIN HCL IN DEXTROSE 1-5 GM/200ML-% IV SOLN
1000.0000 mg | Freq: Once | INTRAVENOUS | Status: AC
  Administered 2022-09-05: 1000 mg via INTRAVENOUS
  Filled 2022-09-05: qty 200

## 2022-09-05 MED ORDER — ACETAMINOPHEN 650 MG RE SUPP
650.0000 mg | Freq: Once | RECTAL | Status: AC
  Administered 2022-09-05: 650 mg via RECTAL
  Filled 2022-09-05: qty 1

## 2022-09-05 MED ORDER — HEPARIN (PORCINE) 25000 UT/250ML-% IV SOLN
700.0000 [IU]/h | INTRAVENOUS | Status: DC
  Administered 2022-09-05 – 2022-09-07 (×2): 700 [IU]/h via INTRAVENOUS
  Filled 2022-09-05 (×2): qty 250

## 2022-09-05 MED ORDER — POLYETHYLENE GLYCOL 3350 17 G PO PACK: 17.0000 g | PACK | Freq: Every day | ORAL | Status: DC | PRN

## 2022-09-05 MED ORDER — LACTATED RINGERS IV SOLN: INTRAVENOUS | Status: AC

## 2022-09-05 MED ORDER — IOHEXOL 350 MG/ML SOLN
100.0000 mL | Freq: Once | INTRAVENOUS | Status: AC | PRN
  Administered 2022-09-05: 100 mL via INTRAVENOUS

## 2022-09-05 MED ORDER — DOCUSATE SODIUM 100 MG PO CAPS: 100.0000 mg | ORAL_CAPSULE | Freq: Two times a day (BID) | ORAL | Status: DC | PRN

## 2022-09-05 MED ORDER — SODIUM CHLORIDE 0.9 % IV SOLN: 2.0000 g | Freq: Two times a day (BID) | INTRAVENOUS | Status: DC

## 2022-09-05 MED ORDER — MELATONIN 3 MG PO TABS
3.0000 mg | ORAL_TABLET | Freq: Every day | ORAL | Status: DC
  Administered 2022-09-06 – 2022-09-12 (×8): 3 mg via ORAL
  Filled 2022-09-05 (×8): qty 1

## 2022-09-05 MED ORDER — SODIUM CHLORIDE 0.9 % IV SOLN
2.0000 g | Freq: Once | INTRAVENOUS | Status: AC
  Administered 2022-09-05: 2 g via INTRAVENOUS
  Filled 2022-09-05: qty 12.5

## 2022-09-05 MED ORDER — LACTATED RINGERS IV BOLUS (SEPSIS)
500.0000 mL | Freq: Once | INTRAVENOUS | Status: AC
  Administered 2022-09-05: 500 mL via INTRAVENOUS

## 2022-09-05 MED ORDER — IOHEXOL 350 MG/ML SOLN
50.0000 mL | Freq: Once | INTRAVENOUS | Status: AC | PRN
  Administered 2022-09-05: 50 mL via INTRAVENOUS

## 2022-09-05 MED ORDER — DIGOXIN 0.25 MG/ML IJ SOLN
0.2500 mg | Freq: Four times a day (QID) | INTRAMUSCULAR | Status: DC
  Administered 2022-09-05 – 2022-09-06 (×3): 0.25 mg via INTRAVENOUS
  Filled 2022-09-05 (×3): qty 2

## 2022-09-05 MED ORDER — HEPARIN BOLUS VIA INFUSION
3000.0000 [IU] | Freq: Once | INTRAVENOUS | Status: AC
  Administered 2022-09-05: 3000 [IU] via INTRAVENOUS
  Filled 2022-09-05: qty 3000

## 2022-09-05 MED ORDER — METRONIDAZOLE 500 MG/100ML IV SOLN
500.0000 mg | Freq: Once | INTRAVENOUS | Status: AC
  Administered 2022-09-05: 500 mg via INTRAVENOUS
  Filled 2022-09-05: qty 100

## 2022-09-05 MED ORDER — VANCOMYCIN HCL 500 MG/100ML IV SOLN: 500.0000 mg | INTRAVENOUS | Status: DC

## 2022-09-05 NOTE — Progress Notes (Signed)
Pharmacy Antibiotic Note  Michaela [name] is a 66 y.o. female admitted on 09/05/2022 presenting with worsening SOB, concern for sepsis.  Pharmacy has been consulted for vancomycin and cefepime dosing.  Plan: Vancomycin 1g IV x 1, then 500 mg IV q 24h (eAUC 430 Cefepime 2g IV every 12 hours Add MRSA PCR Monitor renal function, Cx/PCR to narrow Vancomycin levels as indicated     Temp (24hrs), Avg:100.2 F (37.9 C), Min:100.2 F (37.9 C), Max:100.2 F (37.9 C)  Recent Labs  Lab 09/05/22 1825  CREATININE 0.60    CrCl cannot be calculated (Unknown ideal weight.).    Allergies  Allergen Reactions   Compazine Other (See Comments)    "eyes get buggy"   Prochlorperazine    Prochlorperazine Edisylate    Prochlorperazine Maleate     Bertis Ruddy, PharmD, [place] Pharmacist ED Pharmacist Phone # [phone] 09/05/2022 6:44 PM

## 2022-09-05 NOTE — ED Provider Notes (Signed)
.  Critical Care  Performed by: Lianne Cure, DO Authorized by: Lianne Cure, DO   Critical care provider statement:    Critical care time (minutes):  104   Critical care was necessary to treat or prevent imminent or life-threatening deterioration of the following conditions: sepsis, septic emboli.   Critical care was time spent personally by me on the following activities:  Development of treatment plan with patient or surrogate, discussions with consultants, evaluation of patient's response to treatment, examination of patient, ordering and review of laboratory studies, ordering and review of radiographic studies, ordering and performing treatments and interventions, pulse oximetry, re-evaluation of patient's condition and review of old charts   Care discussed with: admitting provider     Care discussed with comment:  [facility] and cardiology     Lianne Cure, DO 11/57/26 2158

## 2022-09-05 NOTE — ED Notes (Signed)
Pt's sister would like to be contacted with any changes in care (number in chart).

## 2022-09-05 NOTE — Progress Notes (Signed)
ANTICOAGULATION CONSULT NOTE - Initial Consult  Pharmacy Consult for heparin Indication: Atrial thrombus  Allergies  Allergen Reactions   Compazine Other (See Comments)    "eyes get buggy"   Prochlorperazine    Prochlorperazine Edisylate    Prochlorperazine Maleate     Patient Measurements:   Heparin Dosing Weight: TBW  Vital Signs: Temp: 100.2 F (37.9 C) (12/28 1756) Temp Source: Oral (12/28 1756) BP: 115/83 (12/28 2000) Pulse Rate: 143 (12/28 2000)  Labs: Recent Labs    09/05/22 1815 09/05/22 1825 09/05/22 1826  HGB 10.8* 11.2* 11.6*  HCT 36.6 33.0* 34.0*  PLT 207  --   --   APTT 26  --   --   LABPROT 13.4  --   --   INR 1.0  --   --   CREATININE 0.65 0.60  --     CrCl cannot be calculated (Unknown ideal weight.).   Medical History: Past Medical History:  Diagnosis Date   Astrocytoma brain tumor ([place]) 1971   astrocytoma treated with radiation   C. difficile colitis 09/6107   complicated by sepsis/ARDS and prolonged hosp   Depression    Hyperlipidemia    Mitral regurgitation 10/2011   severe, s/p min invasive repair and annuloplasty   Mitral valve prolapse    Neuromuscular disorder (HCC)    numbness in hands and feet   Osteoarthritis    Osteoporosis    DEXA @ [facility] 11/2012: -3.5, prior tx Reclast, last in 2011, rec change to Prolia   Overactive bladder    Pleural effusion, left 11/11/2011   Vitamin D deficiency    Assessment: 7 YOF presenting with SOB, CT shows large atrial thrombus, she is not on anticoagulation PTA  Goal of Therapy:  Heparin level 0.3-0.7 units/ml Monitor platelets by anticoagulation protocol: Yes   Plan:  Heparin 3000 units IV x 1, and gtt at 700 units/hr F/u 6 hour heparin level F/u long term [facility] plan  Bertis Ruddy, PharmD, [place] Pharmacist ED Pharmacist Phone # [phone] 09/05/2022 9:00 PM

## 2022-09-05 NOTE — Consult Note (Signed)
Cardiology Consult    Patient ID: Michaela [name] MRN: [id number], DOB/AGE: 64-Oct-1957   Admit date: 09/05/2022 Date of Consult: 09/05/2022 Requesting Provider:   PCP:  Javier Glazier, MD   [facility] Providers Cardiologist:  Loralie Champagne, MD (Last seen 2016)  Patient Profile    Michaela [name] is a 66 y.o. female with a history of severe MR due to bileaflet prolapse s/p minimally invasive repair with Alferi stitch and annuloplasty in 2013 - she had mild-moderate MS post-op. Post-operative course was complicated by a prolonged hospitalization for CDI, shock, and AHRF during which she also had AF with RVR managed with short course of amiodarone and warfarin as she did not have known recurrence. She is also now wheelchair bound and resides in SNF due to posterior fossa astrocytoma s/p radiation, resection, and shunt placement in childhood with residual spasticity, cerebellar dysfunction, neurogenic bladder, and development of NPH in 2022 requiring placement of a right frontal VP shunt. She is being seen today (09/05/2022) for the evaluation of atrial fibrillation with RVR associated with left atrial thrombus.  History of Present Illness    Most of history obtained from chart and patients sister who is POA. She has been coughing and more short of breath the last few days. She was started on Bactrim for possible UTI recently. Sent from nursing facility for tachycardia and worsening dyspnea. Sister says she has been more confused for the last week or so.  She was found to be in AF with RVR. VS otherwise significant for mild fever. Apparently had mild hypotension with EMS that was fluid-responsive but normotensive since arrival here. She was found to have RSV, as well as a large LAA thrombus on chest CT. There is also evidence of a possible small splenic infarct, though on CT head and CTA C/A/P there is no other evidence of thromboembolism.   Past Medical History   Past Medical  History:  Diagnosis Date   Astrocytoma brain tumor ([place]) 1971   astrocytoma treated with radiation   C. difficile colitis 02/8114   complicated by sepsis/ARDS and prolonged hosp   Depression    Hyperlipidemia    Mitral regurgitation 10/2011   severe, s/p min invasive repair and annuloplasty   Mitral valve prolapse    Neuromuscular disorder (HCC)    numbness in hands and feet   Osteoarthritis    Osteoporosis    DEXA @ [facility] 11/2012: -3.5, prior tx Reclast, last in 2011, rec change to Prolia   Overactive bladder    Pleural effusion, left 11/11/2011   Vitamin D deficiency     Past Surgical History:  Procedure Laterality Date   CARDIAC CATHETERIZATION     Jan 2013   KNEE ARTHROSCOPY Right    MITRAL VALVE REPAIR  10/15/2011   Procedure: MINIMALLY INVASIVE MITRAL VALVE REPAIR (MVR);  Surgeon: Rexene Alberts, MD;  Location: [place];  Service: Open Heart Surgery;  Laterality: Right;   TRANSESOPHAGEAL ECHOCARDIOGRAM  7/12   US ECHOCARDIOGRAPHY  6/12   VENTRICULOPERITONEAL SHUNT  1971     Allergies  Allergen Reactions   Compazine Other (See Comments)    "eyes get buggy"   Prochlorperazine    Prochlorperazine Edisylate    Prochlorperazine Maleate    Inpatient Medications     digoxin  0.25 mg Intravenous Q6H    Family History    Family History  Problem Relation Age of Onset   Coronary artery disease Father 48  died MI   Hyperlipidemia Mother    Heart attack Maternal Grandfather    She indicated that the status of her mother is unknown. She indicated that her father is deceased. She indicated that her sister is alive. She indicated that the status of her maternal grandfather is unknown.   Social History    Social History   Socioeconomic History   Marital status: Single    Spouse name: Not on file   Number of children: 0   Years of education: Not on file   Highest education level: Not on file  Occupational History   Occupation: disabled  Tobacco Use   Smoking status:  Former    Types: Cigarettes    Quit date: 09/09/1990    Years since quitting: 32.0   Smokeless tobacco: Never  Vaping Use   Vaping Use: Never used  Substance and Sexual Activity   Alcohol use: Not Currently    Alcohol/week: 0.0 standard drinks of alcohol    Comment: rarely   Drug use: No   Sexual activity: Not on file  Other Topics Concern   Not on file  Social History Narrative   [facility] to [facility] in SNF.      Patient is right-handed. She is a permanent resident of [facility].     Social Determinants of Health   Financial Resource Strain: Low Risk  (01/22/2018)   Overall Financial Resource Strain (CARDIA)    Difficulty of Paying Living Expenses: Not hard at all  Food Insecurity: No Food Insecurity (01/22/2018)   Hunger Vital Sign    Worried About Running Out of Food in the Last Year: Never true    Ran Out of Food in the Last Year: Never true  Transportation Needs: No Transportation Needs (01/22/2018)   PRAPARE - Hydrologist (Medical): No    Lack of Transportation (Non-Medical): No  Physical Activity: Inactive (01/22/2018)   Exercise Vital Sign    Days of Exercise per Week: 0 days    Minutes of Exercise per Session: 0 min  Stress: No Stress Concern Present (01/22/2018)   [place]    Feeling of Stress : Only a little  Social Connections: Moderately Isolated (01/22/2018)   Social Connection and Isolation Panel [NHANES]    Frequency of Communication with Friends and Family: Three times a week    Frequency of Social Gatherings with Friends and Family: Three times a week    Attends Religious Services: Never    Active Member of Clubs or Organizations: Not asked    Attends Archivist Meetings: Never    Marital Status: Never married  Intimate Partner Violence: Not At Risk (01/22/2018)   Humiliation, Afraid, Rape, and Kick questionnaire     Fear of Current or Ex-Partner: No    Emotionally Abused: No    Physically Abused: No    Sexually Abused: No     Review of Systems    Unable to obtain due to patient condition  Physical Exam    Blood pressure 101/76, pulse (!) 57, temperature 98.5 F (36.9 C), temperature source Oral, resp. rate 17, SpO2 97 %.     Intake/Output Summary (Last 24 hours) at 09/05/2022 2310 Last data filed at 09/05/2022 2000 Gross per 24 hour  Intake 97.93 ml  Output --  Net 97.93 ml   Wt Readings from Last 3 Encounters:  08/31/20 42 kg  09/27/19 42.2 kg  06/25/19 38.1 kg    CONSTITUTIONAL: alert, chronically ill appearing. In no acute distress.  HEENT: sclera anicteric  NECK: slightly elevated JVP, no masses CARDIAC: Irregular rhythm. Intermittent low-grade harsh systolic murmur; no appreciable diastolic murmur.  PULMONARY/CHEST WALL: no deformities, mild basilar crackles, normal work of breathing ABDOMINAL: soft, non-tender, non-distended EXTREMITIES: no edema, warm and well-perfused SKIN: Dry and intact without apparent rashes or wounds. No peripheral cyanosis. NEUROLOGIC: alert, no abnormal movements, dysarthric   Labs    Recent Labs    09/05/22 2040  TROPONINIHS <2   Lab Results  Component Value Date   WBC 9.3 09/05/2022   HGB 11.6 (L) 09/05/2022   HCT 34.0 (L) 09/05/2022   MCV 99.2 09/05/2022   PLT 207 09/05/2022    Recent Labs  Lab 09/05/22 1815 09/05/22 1825 09/05/22 1826  NA 136 136 136  K 3.7 3.6 3.7  CL 102 101  --   CO2 24  --   --   BUN 19 19  --   CREATININE 0.65 0.60  --   CALCIUM 8.0*  --   --   PROT 5.5*  --   --   BILITOT 0.4  --   --   ALKPHOS 64  --   --   ALT 28  --   --   AST 31  --   --   GLUCOSE 89 84  --    Lab Results  Component Value Date   CHOL 135 01/27/2019   HDL 42 01/27/2019   LDLCALC 85 01/27/2019   TRIG 41 01/27/2019   No results found for: "DDIMER" Recent Labs    09/05/22 1815  BNP 706.4*   No results for  input(s): "PROBNP" in the last 8760 hours.    Radiology Studies    CT Angio Chest/Abd/Pel for Dissection W and/or Wo Contrast  Result Date: 09/05/2022 CLINICAL DATA:  Left atrial thrombus.  Altered mental status. EXAM: CT ANGIOGRAPHY CHEST, ABDOMEN AND PELVIS TECHNIQUE: Non-contrast CT of the chest was initially obtained. Multidetector CT imaging through the chest, abdomen and pelvis was performed using the standard protocol during bolus administration of intravenous contrast. Multiplanar reconstructed images and MIPs were obtained and reviewed to evaluate the vascular anatomy. RADIATION DOSE REDUCTION: This exam was performed according to the departmental dose-optimization program which includes automated exposure control, adjustment of the mA and/or kV according to patient size and/or use of iterative reconstruction technique. CONTRAST:  177m OMNIPAQUE IOHEXOL 350 MG/ML SOLN COMPARISON:  Earlier chest CT dated 09/05/2022. FINDINGS: Evaluation is limited due to streak artifact caused by patient's arms and dense contrast within the vessels. CTA CHEST FINDINGS Cardiovascular: Borderline cardiomegaly. There is large thrombus within the left atrial appendage as seen on the earlier CT. An intracardiac mass or malignancy is not excluded. Clinical correlation and follow-up recommended. No pericardial effusion. Mild atherosclerotic calcification of the thoracic aorta. No aneurysmal dilatation or dissection. The origins of the great vessels of the aortic arch appear patent as visualized. No pulmonary artery embolus identified. Mediastinum/Nodes: Borderline enlarged mediastinal lymph nodes in the prevascular space measure up to 11 mm short axis. The esophagus is grossly unremarkable. No mediastinal fluid collection. Lungs/Pleura: Small bilateral pleural effusions with associated bibasilar compressive atelectasis. Infiltrate is not excluded. Diffuse interstitial and interlobular septal prominence consistent with  edema. There is no pneumothorax. The central airways are patent. Musculoskeletal: No acute osseous pathology. Review of the MIP images confirms the above findings. CTA ABDOMEN AND PELVIS FINDINGS VASCULAR Aorta: Moderate atherosclerotic  calcification of the aorta. No aneurysmal dilatation or dissection. No periaortic fluid collection. Celiac: The celiac trunk and its major branches are patent. SMA: The SMA is patent. Renals: The renal arteries are patent. IMA: The IMA is patent. Inflow: The iliac arteries are patent. No aneurysmal dilatation or dissection. Veins: No obvious venous abnormality within the limitations of this arterial phase study. Review of the MIP images confirms the above findings. NON-VASCULAR No intra-abdominal free air or free fluid. Hepatobiliary: The liver is unremarkable. No biliary dilatation. Multiple layering gallstones. No pericholecystic fluid or evidence of acute cholecystitis by CT. Pancreas: Unremarkable. No pancreatic ductal dilatation or surrounding inflammatory changes. Spleen: A 2 cm hypoenhancing focus in the inferior pole of the spleen is suboptimally evaluated but may represent an infarct. Adrenals/Urinary Tract: The adrenal glands are unremarkable. The kidneys, visualized ureters appear unremarkable. There is diffuse trabeculated appearance of the bladder wall likely related to chronic bladder outlet obstruction. Stomach/Bowel: There is moderate stool throughout the colon. There is no bowel obstruction or active inflammation. The appendix is normal. Lymphatic: No adenopathy. Reproductive: The uterus is grossly unremarkable. No adnexal masses. Other: VP shunt tubes in the anterior abdomen with tips in the left lateral abdomen and right hemipelvis. No fluid collection. Musculoskeletal: Osteopenia with degenerative changes of the spine. Old appearing mild compression fracture of superior endplate of L1. Review of the MIP images confirms the above findings. IMPRESSION: 1. No aortic  aneurysm or dissection. 2. Large thrombus within the left atrial appendage as seen on the earlier CT. An intracardiac mass or malignancy is not excluded. 3. Small bilateral pleural effusions with associated bibasilar compressive atelectasis. Infiltrate is not excluded. 4. Possible small focal splenic infarct. 5. Cholelithiasis. 6. No bowel obstruction. Normal appendix. Electronically Signed   By: Anner Crete M.D.   On: 09/05/2022 21:37   CT Head Wo Contrast  Result Date: 09/05/2022 CLINICAL DATA:  Altered mental status EXAM: CT HEAD WITHOUT CONTRAST TECHNIQUE: Contiguous axial images were obtained from the base of the skull through the vertex without intravenous contrast. RADIATION DOSE REDUCTION: This exam was performed according to the departmental dose-optimization program which includes automated exposure control, adjustment of the mA and/or kV according to patient size and/or use of iterative reconstruction technique. COMPARISON:  10/16/2021 FINDINGS: Brain: Right frontal approach shunt catheter terminating at the right foramen of Monro. Right parietal approach shunt catheter tip along the roof of the right lateral ventricle. The sizes of the ventricles have markedly decreased since 10/16/2021. There is unchanged gliosis along the right frontal catheter tract. Unchanged postsurgical appearance of the posterior fossa and brainstem. No acute hemorrhage, mass or extra-axial collection. Chronic left posterior parietal encephalomalacia. Vascular: No hyperdense vessel or unexpected calcification. Skull: Right frontal and parietal burr holes. Remote suboccipital craniectomy. Sinuses/Orbits: No acute finding. Other: None. IMPRESSION: 1. No acute intracranial abnormality. 2. Marked decrease in size of the lateral ventricles compared to 10/16/2021. 3. Unchanged postsurgical appearance of the posterior fossa and brainstem. Electronically Signed   By: Ulyses Jarred M.D.   On: 09/05/2022 21:28   CT Chest W  Contrast  Result Date: 09/05/2022 CLINICAL DATA:  Respiratory illness. EXAM: CT CHEST WITH CONTRAST TECHNIQUE: Multidetector CT imaging of the chest was performed during intravenous contrast administration. RADIATION DOSE REDUCTION: This exam was performed according to the departmental dose-optimization program which includes automated exposure control, adjustment of the mA and/or kV according to patient size and/or use of iterative reconstruction technique. CONTRAST:  57m OMNIPAQUE IOHEXOL 350 MG/ML SOLN COMPARISON:  Chest radiograph dated 09/05/2022. CT dated 09/24/2011. FINDINGS: Cardiovascular: Borderline cardiomegaly. No pericardial effusion. There is a large thrombus in the left atrium and left atrial appendage. Underlying mass is not excluded malignancy is not excluded. This can predispose to systemic embolization and infarcts. There is mild atherosclerotic calcification of the thoracic aorta. No aneurysmal dilatation or dissection. The central pulmonary arteries appear patent for the degree of opacification. Mediastinum/Nodes: No hilar adenopathy. Mildly enlarged mediastinal lymph node measure up to 11 mm in short axis in the prevascular space. The esophagus is grossly unremarkable. No mediastinal fluid collection. Lungs/Pleura: Small bilateral pleural effusions with partial compressive atelectasis of the lower lobes. There is diffuse interstitial and interlobular septal prominence consistent with edema. There is no pneumothorax. The central airways are patent. Upper Abdomen: A 2 cm hypoenhancing focus in the inferior spleen may represent a focus of infarct. Multiple gallstones. Musculoskeletal: Osteopenia with degenerative changes of the spine. Age indeterminate, likely old, compression fracture of the superior endplate of L1. No acute osseous pathology. IMPRESSION: 1. Large thrombus in the left atrium and left atrial appendage. This can predispose to systemic embolization and infarcts. 2. Probable  small focus of infarct in the spleen. If there is clinical concern for additional intra-abdominal infarcts, CT of the abdomen pelvis may provide better evaluation. 3. Small bilateral pleural effusions with partial compressive atelectasis of the lower lobes. 4. Interstitial and interlobular septal prominence consistent with edema. 5. Cholelithiasis. 6.  Aortic Atherosclerosis (ICD10-I70.0). These results were called by telephone at the time of interpretation on 09/05/2022 at 8:41 pm to provider Campbell Stall , who verbally acknowledged these results. Electronically Signed   By: Anner Crete M.D.   On: 09/05/2022 20:42   DG Chest Port 1 View  Result Date: 09/05/2022 CLINICAL DATA:  Questionable sepsis. EXAM: PORTABLE CHEST 1 VIEW COMPARISON:  Chest radiograph dated 04/01/2016. FINDINGS: Mild diffuse interstitial prominence, likely edema. Atypical infiltrate is less likely but not excluded. There is a small left pleural effusion. No pneumothorax. The cardiac silhouette is within normal limits. Cardiac valve repair. Atherosclerotic calcification of the arch. No acute osseous pathology. Right VP shunt. IMPRESSION: 1. Small left pleural effusion. 2. Mild diffuse interstitial prominence, likely edema. Electronically Signed   By: Anner Crete M.D.   On: 09/05/2022 19:11    ECG & Cardiac Imaging    ECG: AF with RVR - personally reviewed.  Prior echo reports reviewed.   Assessment & Plan    Atrial fibrillation with RVR, not on anticoagulation Large left atrial appendage thrombus extending into the left atrium, associated with possible splenic infarct Mitral valve repair with Alferi stitch and annulopasty in 2013 for severe MR with mild-moderate stenosis post-op, now with severe LA dilation, evidence of PH on echo, and AF with significant left atrial thrombus, overall concerning for worsening mitral stenosis.   - Heparin IV with bolus, high intensity (VTE) protocol - Rate control only due to risk of  thromboembolism, options limited by blood pressure/sepsis. Load with digoxin 0.'25mg'$  IV every 6 hours for now. Second line would be metoprolol 12.5-'25mg'$  PO every 6 hours as tolerated with goal HR < 130 by the morning. May require low-dose vasopressor to achieve this.  - TTE tomorrow once rate better controlled.  - No indication for CTS consult emergently. She would also be a poor surgical candidate.  Signed, Marykay Lex, MD 09/05/2022, 11:10 PM  For questions or updates, please contact   Please consult [email or web address] for contact info under Cardiology/STEMI.

## 2022-09-05 NOTE — ED Notes (Signed)
Provider notified of critical lab value

## 2022-09-05 NOTE — ED Provider Notes (Signed)
[facility] EMERGENCY DEPARTMENT Provider Note   CSN: [id number] Arrival date & time: 09/05/22  1758     History  Chief Complaint  Patient presents with   Shortness of Breath    Michaela [name] is a 66 y.o. female.  Past medical history of mitral valve prolapse, mitral regurgitation, mitral valve repair, astrocytoma with VP shunt, chronic ataxia, neurogenic bladder, diastolic CHF, paroxysmal A-fib, C. difficile colitis.  Presents to the emergency department from nursing facility for chief complaint of tachycardia and shortness of breath.  Over the past handful of days patient has had worsening shortness of breath and cough.  Was recently seen by either the doctor at the facility or her primary doctor where they performed labs and found her to have a high white blood cell count and was put on some broad-spectrum oral antibiotics.  This was out of concern for urinary tract infection as she has a history of UTIs due to her neurogenic bladder.  Her daughter states that earlier today she was confused, and was excessively thirsty, drinking multiple cups of water.   Shortness of Breath      Home Medications Prior to Admission medications   Medication Sig Start Date End Date Taking? Authorizing Provider  atorvastatin (LIPITOR) 20 MG tablet Take 20 mg by mouth daily.     [provider]  bisacodyl (DULCOLAX) 10 MG suppository Constipation (2 of 4): If not relieved by MOM, give 10 mg Bisacodyl suppositiory rectally X 1 dose in 24 hours as needed (Do not use constipation standing orders for residents with renal failure/CFR less than 30. Contact MD for orders)    [provider]  cholecalciferol (VITAMIN D) 1000 units tablet Take 1,000 Units by mouth daily.    [provider]  clonazePAM (KLONOPIN) 0.25 MG disintegrating tablet Take 1 tablet (0.25 mg total) by mouth 2 (two) times daily. 06/09/19   Medina-Vargas, Monina C, NP  Cranberry 125 MG TABS  Take by mouth.    [provider]  Docusate Sodium (COLACE PO) Take by mouth.    [provider]  escitalopram (LEXAPRO) 10 MG tablet Take 1 tablet (10 mg total) by mouth daily. 01/06/17   Martinique, Betty G, MD  linaclotide [facility]) 145 MCG CAPS capsule TAKE ONE CAPSULE BY MOUTH DAILY BEFORE BREAKFAST Patient not taking: Reported on 09/27/2019 01/06/17   [place], Betty G, MD  loratadine (CLARITIN) 10 MG tablet Take 10 mg by mouth daily.    [provider]  Menthol, Topical Analgesic, (BIOFREEZE) 4 % GEL Apply 1 application topically 2 (two) times daily. Apply topically BID to sore areas - back    [provider]  polyvinyl alcohol (LIQUIFILM TEARS) 1.4 % ophthalmic solution Place 1 drop into both eyes 4 (four) times daily.     [provider]  sulfamethoxazole-trimethoprim (BACTRIM DS) 800-160 MG tablet Take 1 tablet by mouth 2 (two) times daily. 08/28/20   [provider]  White Petrolatum-Mineral Oil ([place] P.[facility]) Apply 1 application to eye at bedtime. Apply to right eye    [provider]      Allergies    Compazine, Prochlorperazine, Prochlorperazine edisylate, and Prochlorperazine maleate    Review of Systems   Review of Systems  Respiratory:  Positive for shortness of breath.     Physical Exam Updated Vital Signs BP (!) 107/56   Pulse (!) 101   Temp 98.5 F (36.9 C) (Oral)   Resp 17   SpO2 98%  Physical Exam Vitals and nursing note reviewed.  Constitutional:      General: She is in acute distress.     Appearance: She is well-developed. She is ill-appearing and diaphoretic.  HENT:     Head: Normocephalic and atraumatic.  Eyes:     Conjunctiva/sclera: Conjunctivae normal.  Cardiovascular:     Rate and Rhythm: Tachycardia present. Rhythm irregular.     Heart sounds: No murmur heard. Pulmonary:     Effort: Pulmonary effort is normal. No tachypnea or respiratory distress.     Breath sounds: Decreased breath  sounds present.  Abdominal:     Palpations: Abdomen is soft.     Tenderness: There is no abdominal tenderness. There is no guarding or rebound.  Musculoskeletal:        General: No swelling.     Cervical back: Neck supple.     Right lower leg: No edema.     Left lower leg: No edema.  Skin:    General: Skin is warm.     Capillary Refill: Capillary refill takes less than 2 seconds.  Neurological:     Mental Status: She is alert. She is disoriented and confused.     Cranial Nerves: Dysarthria present. No facial asymmetry.     Sensory: Sensation is intact. No sensory deficit.     Motor: Motor function is intact. No weakness, tremor or abnormal muscle tone.  Psychiatric:        Mood and Affect: Mood normal.     ED Results / Procedures / Treatments   Labs (all labs ordered are listed, but only abnormal results are displayed) Labs Reviewed  RESP PANEL BY RT-PCR (RSV, FLU A&B, COVID)  RVPGX2 - Abnormal; Notable for the following components:      Result Value   Resp Syncytial Virus by PCR POSITIVE (*)    All other components within normal limits  LACTIC ACID, PLASMA - Abnormal; Notable for the following components:   Lactic Acid, Venous 5.8 (*)    All other components within normal limits  COMPREHENSIVE METABOLIC PANEL - Abnormal; Notable for the following components:   Calcium 8.0 (*)    Total Protein 5.5 (*)    Albumin 2.7 (*)    All other components within normal limits  CBC WITH DIFFERENTIAL/PLATELET - Abnormal; Notable for the following components:   RBC 3.69 (*)    Hemoglobin 10.8 (*)    MCHC 29.5 (*)    All other components within normal limits  BRAIN NATRIURETIC PEPTIDE - Abnormal; Notable for the following components:   B Natriuretic Peptide 706.4 (*)    All other components within normal limits  I-STAT CHEM 8, ED - Abnormal; Notable for the following components:   Calcium, Ion 1.02 (*)    Hemoglobin 11.2 (*)    HCT 33.0 (*)    All other components within normal  limits  I-STAT VENOUS BLOOD GAS, ED - Abnormal; Notable for the following components:   pCO2, Ven 42.3 (*)    Calcium, Ion 1.06 (*)    HCT 34.0 (*)    Hemoglobin 11.6 (*)    All other components within normal limits  MRSA NEXT GEN BY PCR, NASAL  CULTURE, BLOOD (ROUTINE X 2)  CULTURE, BLOOD (ROUTINE X 2)  URINE CULTURE  LACTIC ACID, PLASMA  PROTIME-INR  APTT  URINALYSIS, ROUTINE W REFLEX MICROSCOPIC  HEPARIN LEVEL (UNFRACTIONATED)  CBC  CBG MONITORING, ED  TROPONIN I (HIGH SENSITIVITY)  TROPONIN I (HIGH SENSITIVITY)    EKG None  Radiology CT Angio Chest/Abd/Pel for Dissection W and/or Wo Contrast  Result Date: 09/05/2022 CLINICAL DATA:  Left atrial thrombus.  Altered mental status. EXAM: CT ANGIOGRAPHY CHEST, ABDOMEN AND PELVIS TECHNIQUE: Non-contrast CT of the chest was initially obtained. Multidetector CT imaging through the chest, abdomen and pelvis was performed using the standard protocol during bolus administration of intravenous contrast. Multiplanar reconstructed images and MIPs were obtained and reviewed to evaluate the vascular anatomy. RADIATION DOSE REDUCTION: This exam was performed according to the departmental dose-optimization program which includes automated exposure control, adjustment of the mA and/or kV according to patient size and/or use of iterative reconstruction technique. CONTRAST:  16m OMNIPAQUE IOHEXOL 350 MG/ML SOLN COMPARISON:  Earlier chest CT dated 09/05/2022. FINDINGS: Evaluation is limited due to streak artifact caused by patient's arms and dense contrast within the vessels. CTA CHEST FINDINGS Cardiovascular: Borderline cardiomegaly. There is large thrombus within the left atrial appendage as seen on the earlier CT. An intracardiac mass or malignancy is not excluded. Clinical correlation and follow-up recommended. No pericardial effusion. Mild atherosclerotic calcification of the thoracic aorta. No aneurysmal dilatation or dissection. The origins of the  great vessels of the aortic arch appear patent as visualized. No pulmonary artery embolus identified. Mediastinum/Nodes: Borderline enlarged mediastinal lymph nodes in the prevascular space measure up to 11 mm short axis. The esophagus is grossly unremarkable. No mediastinal fluid collection. Lungs/Pleura: Small bilateral pleural effusions with associated bibasilar compressive atelectasis. Infiltrate is not excluded. Diffuse interstitial and interlobular septal prominence consistent with edema. There is no pneumothorax. The central airways are patent. Musculoskeletal: No acute osseous pathology. Review of the MIP images confirms the above findings. CTA ABDOMEN AND PELVIS FINDINGS VASCULAR Aorta: Moderate atherosclerotic calcification of the aorta. No aneurysmal dilatation or dissection. No periaortic fluid collection. Celiac: The celiac trunk and its major branches are patent. SMA: The SMA is patent. Renals: The renal arteries are patent. IMA: The IMA is patent. Inflow: The iliac arteries are patent. No aneurysmal dilatation or dissection. Veins: No obvious venous abnormality within the limitations of this arterial phase study. Review of the MIP images confirms the above findings. NON-VASCULAR No intra-abdominal free air or free fluid. Hepatobiliary: The liver is unremarkable. No biliary dilatation. Multiple layering gallstones. No pericholecystic fluid or evidence of acute cholecystitis by CT. Pancreas: Unremarkable. No pancreatic ductal dilatation or surrounding inflammatory changes. Spleen: A 2 cm hypoenhancing focus in the inferior pole of the spleen is suboptimally evaluated but may represent an infarct. Adrenals/Urinary Tract: The adrenal glands are unremarkable. The kidneys, visualized ureters appear unremarkable. There is diffuse trabeculated appearance of the bladder wall likely related to chronic bladder outlet obstruction. Stomach/Bowel: There is moderate stool throughout the colon. There is no bowel  obstruction or active inflammation. The appendix is normal. Lymphatic: No adenopathy. Reproductive: The uterus is grossly unremarkable. No adnexal masses. Other: VP shunt tubes in the anterior abdomen with tips in the left lateral abdomen and right hemipelvis. No fluid collection. Musculoskeletal: Osteopenia with degenerative changes of the spine. Old appearing mild compression fracture of superior endplate of L1. Review of the MIP images confirms the above findings. IMPRESSION: 1. No aortic aneurysm or dissection. 2. Large thrombus within the left atrial appendage as seen on the earlier CT. An intracardiac mass or malignancy is not excluded. 3. Small bilateral pleural effusions with associated bibasilar compressive atelectasis. Infiltrate is not excluded. 4. Possible small focal splenic infarct. 5. Cholelithiasis. 6. No bowel obstruction. Normal appendix. Electronically Signed   By: ALaren EvertsD.  On: 09/05/2022 21:37   CT Head Wo Contrast  Result Date: 09/05/2022 CLINICAL DATA:  Altered mental status EXAM: CT HEAD WITHOUT CONTRAST TECHNIQUE: Contiguous axial images were obtained from the base of the skull through the vertex without intravenous contrast. RADIATION DOSE REDUCTION: This exam was performed according to the departmental dose-optimization program which includes automated exposure control, adjustment of the mA and/or kV according to patient size and/or use of iterative reconstruction technique. COMPARISON:  10/16/2021 FINDINGS: Brain: Right frontal approach shunt catheter terminating at the right foramen of [place]. Right parietal approach shunt catheter tip along the roof of the right lateral ventricle. The sizes of the ventricles have markedly decreased since 10/16/2021. There is unchanged gliosis along the right frontal catheter tract. Unchanged postsurgical appearance of the posterior fossa and brainstem. No acute hemorrhage, mass or extra-axial collection. Chronic left posterior parietal  encephalomalacia. Vascular: No hyperdense vessel or unexpected calcification. Skull: Right frontal and parietal burr holes. Remote suboccipital craniectomy. Sinuses/Orbits: No acute finding. Other: None. IMPRESSION: 1. No acute intracranial abnormality. 2. Marked decrease in size of the lateral ventricles compared to 10/16/2021. 3. Unchanged postsurgical appearance of the posterior fossa and brainstem. Electronically Signed   By: Ulyses Jarred M.D.   On: 09/05/2022 21:28   CT Chest W Contrast  Result Date: 09/05/2022 CLINICAL DATA:  Respiratory illness. EXAM: CT CHEST WITH CONTRAST TECHNIQUE: Multidetector CT imaging of the chest was performed during intravenous contrast administration. RADIATION DOSE REDUCTION: This exam was performed according to the departmental dose-optimization program which includes automated exposure control, adjustment of the mA and/or kV according to patient size and/or use of iterative reconstruction technique. CONTRAST:  5m OMNIPAQUE IOHEXOL 350 MG/ML SOLN COMPARISON:  Chest radiograph dated 09/05/2022. CT dated 09/24/2011. FINDINGS: Cardiovascular: Borderline cardiomegaly. No pericardial effusion. There is a large thrombus in the left atrium and left atrial appendage. Underlying mass is not excluded malignancy is not excluded. This can predispose to systemic embolization and infarcts. There is mild atherosclerotic calcification of the thoracic aorta. No aneurysmal dilatation or dissection. The central pulmonary arteries appear patent for the degree of opacification. Mediastinum/Nodes: No hilar adenopathy. Mildly enlarged mediastinal lymph node measure up to 11 mm in short axis in the prevascular space. The esophagus is grossly unremarkable. No mediastinal fluid collection. Lungs/Pleura: Small bilateral pleural effusions with partial compressive atelectasis of the lower lobes. There is diffuse interstitial and interlobular septal prominence consistent with edema. There is no  pneumothorax. The central airways are patent. Upper Abdomen: A 2 cm hypoenhancing focus in the inferior spleen may represent a focus of infarct. Multiple gallstones. Musculoskeletal: Osteopenia with degenerative changes of the spine. Age indeterminate, likely old, compression fracture of the superior endplate of L1. No acute osseous pathology. IMPRESSION: 1. Large thrombus in the left atrium and left atrial appendage. This can predispose to systemic embolization and infarcts. 2. Probable small focus of infarct in the spleen. If there is clinical concern for additional intra-abdominal infarcts, CT of the abdomen pelvis may provide better evaluation. 3. Small bilateral pleural effusions with partial compressive atelectasis of the lower lobes. 4. Interstitial and interlobular septal prominence consistent with edema. 5. Cholelithiasis. 6.  Aortic Atherosclerosis (ICD10-I70.0). These results were called by telephone at the time of interpretation on 09/05/2022 at 8:41 pm to provider ACampbell Stall, who verbally acknowledged these results. Electronically Signed   By: AAnner CreteM.D.   On: 09/05/2022 20:42   DG Chest Port 1 View  Result Date: 09/05/2022 CLINICAL DATA:  Questionable sepsis. EXAM:  PORTABLE CHEST 1 VIEW COMPARISON:  Chest radiograph dated 04/01/2016. FINDINGS: Mild diffuse interstitial prominence, likely edema. Atypical infiltrate is less likely but not excluded. There is a small left pleural effusion. No pneumothorax. The cardiac silhouette is within normal limits. Cardiac valve repair. Atherosclerotic calcification of the arch. No acute osseous pathology. Right VP shunt. IMPRESSION: 1. Small left pleural effusion. 2. Mild diffuse interstitial prominence, likely edema. Electronically Signed   By: Anner Crete M.D.   On: 09/05/2022 19:11    Procedures Procedures    Medications Ordered in ED Medications  lactated ringers infusion ( Intravenous New Bag/Given 09/05/22 1849)  vancomycin  (VANCOREADY) IVPB 500 mg/100 mL (has no administration in time range)  ceFEPIme (MAXIPIME) 2 g in sodium chloride 0.9 % 100 mL IVPB (has no administration in time range)  heparin ADULT infusion 100 units/mL (25000 units/263m) (700 Units/hr Intravenous New Bag/Given 09/05/22 2244)  digoxin (LANOXIN) 0.25 MG/ML injection 0.25 mg (0.25 mg Intravenous Given 09/05/22 2240)  lactated ringers bolus 500 mL (0 mLs Intravenous Stopped 09/05/22 1954)  ceFEPIme (MAXIPIME) 2 g in sodium chloride 0.9 % 100 mL IVPB (0 g Intravenous Stopped 09/05/22 2000)  metroNIDAZOLE (FLAGYL) IVPB 500 mg (0 mg Intravenous Stopped 09/05/22 2103)  vancomycin (VANCOCIN) IVPB 1000 mg/200 mL premix (0 mg Intravenous Stopped 09/05/22 2103)  acetaminophen (TYLENOL) suppository 650 mg (650 mg Rectal Given 09/05/22 1854)  iohexol (OMNIPAQUE) 350 MG/ML injection 50 mL (50 mLs Intravenous Contrast Given 09/05/22 2027)  heparin bolus via infusion 3,000 Units (3,000 Units Intravenous Bolus from Bag 09/05/22 2245)  iohexol (OMNIPAQUE) 350 MG/ML injection 100 mL (100 mLs Intravenous Contrast Given 09/05/22 2118)    ED Course/ Medical Decision Making/ A&P                           Medical Decision Making Problems Addressed: Atrial fibrillation with rapid ventricular response ([facility]: acute illness or injury that poses a threat to life or bodily functions Embolism and thrombosis ([place]: acute illness or injury that poses a threat to life or bodily functions Sepsis, due to unspecified organism, unspecified whether acute organ dysfunction present ([facility]: acute illness or injury that poses a threat to life or bodily functions  Amount and/or Complexity of Data Reviewed Labs: ordered. Radiology: ordered. ECG/medicine tests: ordered.  Risk OTC drugs. Prescription drug management. Decision regarding hospitalization.   Pt is a 66y.o. female with pertinent PMHX mitral valve prolapse, mitral regurgitation, mitral valve repair, astrocytoma  with VP shunt, chronic ataxia, neurogenic bladder, diastolic CHF, paroxysmal A-fib, C. difficile colitis who presents w/ shortness of breath and tachycardia.   SIRS criteria met by abnormalities including tachycardia and hypotension. Suspected infection, source respiratory. Code Sepsis was called immediately after vital sign / lab abnormalities c/w SIRS with suspected source of infection. Labs, cultures, IVFs, antibiotics ordered once Code Sepsis called.  Additionally, IV fluid bolus ordered, only 500 mL to add to the 500 mL received by EMS.  For her 30 cc/kg is about 1.3 L.  On my bedside echo, she has evidence of poor ejection fraction, I opted to only give a total of 1 L IV fluids out of concern for volume overload causing worsening heart function.  Labs performed and resulted above. Pertinent lab findings include elevated BNP to 700.  No leukocytosis, positive for RSV.  Initial lactic acid normal at 1.7.  Initial troponin normal as well. Imaging abnormalities include large left atrial thrombus as well as small splenic infarct.  This is concerning for high potential of showering clots throughout the body including to the brain or abdominal organs or extremities.  At this time, all extremities are warm and well-perfused with good pulses.  Abdomen is soft and nontender.  Antibiotics were started on this patient: Vancomycin, cefepime, metronidazole. Blood cultures x2 were drawn prior to infusion of antibiotics.   To the A-fib with RVR and large left atrial thrombus, cardiology was consulted.  After discussion with them, opting to start digoxin at 0.25 mg every 6 hours x 4 to help bring her heart rate down just a little bit.  They note that she needs a stat TTE which I had already ordered, which has not been done yet.  Getting her heart rate down into the 130s will better help with mitral valve evaluation on the TTE.  This patient is very high risk for worsening, requiring ICU admission.  Prior to admission to  the ICU, head CT was obtained which per my review shows no signs of acute intracranial abnormality.  There is no signs of stroke, bleeding, hydrocephalus, or masses.  Additionally, CT angio of the chest abdomen pelvis was obtained to evaluate for any further showering of clots, which did not show anything other than the splenic infarct showed on the initial chest CT.  Currently, cardiology believes that just heparin and the digoxin or the interventions that are needed, if any of her imaging were to show anything or her condition were to take a turn for the worse, CT surgery may need to be consulted, however at this time this is not necessary.  Patient admitted to critical care. The plan for this patient was discussed with Dr. Pearline Cables, who voiced agreement and who oversaw evaluation and treatment of this patient.           Final Clinical Impression(s) / ED Diagnoses Final diagnoses:  Sepsis, due to unspecified organism, unspecified whether acute organ dysfunction present [facility])  Atrial fibrillation with rapid ventricular response ([place])  RSV (acute bronchiolitis due to respiratory syncytial virus)  Embolism and thrombosis [facility])  Splenic infarct    Rx / DC Orders ED Discharge Orders     None         Phyllis Ginger, MD 00/45/99 [id number]    Campbell Stall P, DO 42/39/53 [id number]

## 2022-09-05 NOTE — ED Notes (Signed)
NT went in to check on pt and found that the room monitor had been turned off. Pt admitted that her husband had turned it off due to loud beeping noses. NT let pt know that it was important to keep it on at all times. Will continue to monitor.

## 2022-09-05 NOTE — ED Triage Notes (Addendum)
Pt BIBGEMS from [place] for tachycardia and shortness of breath. Pt alert and palpable pulses while ems reports a BP of "40/20" Pt had a HR 150-180s, Nausea and vomiting. One hr report of 200 in afib.Pt alert during report.   500 NS bolus in field   Originally BP 96/40

## 2022-09-05 NOTE — ED Notes (Signed)
Patient transported to CT 

## 2022-09-05 NOTE — H&P (Signed)
NAME:  Michaela [name], MRN:  [id number], DOB:  1956/03/30, LOS: 0 ADMISSION DATE:  09/05/2022, CONSULTATION DATE: 09/05/2022 REFERRING MD:  Lianne Cure, DO , CHIEF COMPLAINT: Increasing shortness of breath  History of Present Illness:  66 year old wheelchair-bound female with mitral valve prolapse and severe MR s/p repair and annuloplasty, history of astrocytoma s/p radiation, and surgical resection, complicated with obstructive hydrocephalus, now with VP shunt who was brought into the emergency department from skilled care nursing facility with a complaint of cough and increasing shortness of breath for last few days.  Per patient's sister who is [name], she was seen by patient's primary care physician, had lab work done which showed elevated white count, was placed on Bactrim considering she may have UTI but over the last few days she started with cough and increasing shortness of breath and today she was found confused, so she was sent to the emergency department for evaluation.  In the emergency department she was noted to be in A-fib with RVR with heart rate in 150s, hypoxic requiring nasal cannula oxygen.  On further workup she was noted to have left atrial enlargement with left atrial appendage clot and splenic infarct, PCCM was consulted for help evaluation medical management  Pertinent  Medical History   Past Medical History:  Diagnosis Date   Astrocytoma brain tumor ([place]) 1971   astrocytoma treated with radiation   C. difficile colitis 02/7208   complicated by sepsis/ARDS and prolonged hosp   Depression    Hyperlipidemia    Mitral regurgitation 10/2011   severe, s/p min invasive repair and annuloplasty   Mitral valve prolapse    Neuromuscular disorder (HCC)    numbness in hands and feet   Osteoarthritis    Osteoporosis    DEXA @ [facility] 11/2012: -3.5, prior tx Reclast, last in 2011, rec change to Prolia   Overactive bladder    Pleural effusion, left 11/11/2011   Vitamin D  deficiency      Significant Hospital Events: Including procedures, antibiotic start and stop dates in addition to other pertinent events     Interim History / Subjective:    Objective   Blood pressure (!) 107/56, pulse (!) 101, temperature 98.5 F (36.9 C), temperature source Oral, resp. rate 17, SpO2 98 %.        Intake/Output Summary (Last 24 hours) at 09/05/2022 2342 Last data filed at 09/05/2022 2000 Gross per 24 hour  Intake 97.93 ml  Output --  Net 97.93 ml   There were no vitals filed for this visit.  Examination: Physical exam: General: Acute on chronically ill-appearing female, lying on the bed HEENT: /AT, eyes anicteric.  Dry mucus membranes Neuro: Awake, dysarthric, hard of hearing, moving all 4 extremities Chest: Coarse breath sounds bilaterally, no wheezes or rhonchi Heart: Irregularly irregular, tachycardic, no murmurs or gallops Abdomen: Soft, nontender, nondistended, bowel sounds present Skin: No rash   Resolved Hospital Problem list     Assessment & Plan:  Acute hypoxic respiratory failure Sepsis due to RSV pneumonia Paroxysmal atrial fibrillation with rapid ventricular response Large left atrial thrombus Acute splenic infarction Lactic acidosis MVP/MVR Astrocytoma s/p resection, obstructive hydrocephalus now with VP shunt  Continue nasal cannula oxygen with O2 sat goal 92% Patient received IV fluid per sepsis bundle, she looks little bit volume overloaded, will hold off on further volume resuscitation Continue droplet precautions Patient received cefepime and vancomycin in the emergency department prior to she was found to have RSV, will hold off on  antibiotic for now Trend lactate Patient heart rate is not well-controlled Cardiology is consulted, recommend 0.25 mg of IV digoxin and if blood pressure allows patient can be started on beta-blocker Patient blood pressure is borderline at this time so we will hold off on beta-blocker  therapy CT chest and bedside echocardiogram showed large LA with thrombus Started on IV heparin infusion Hold off on amiodarone as patient may convert with amiodarone and now she has known LA thrombus which can cause distant embolization Trend lactate Patient will have echocardiogram tomorrow and possible TEE to evaluate mitral valve as patient had minimally invasive mitral valve repair with annuloplasty in the past   Best Practice (right click and "Reselect all SmartList Selections" daily)   Diet/type: NPO w/ oral meds DVT prophylaxis: systemic heparin GI prophylaxis: N/A Lines: N/A Foley:  N/A Code Status:  limited DNR but intubation is okay Last date of multidisciplinary goals of care discussion [12/28: Patient's sister was updated at bedside, she would want to keep patient DNR but if she needs ventilator support, patient's sister stated to proceed with it]  Labs   CBC: Recent Labs  Lab 09/05/22 1815 09/05/22 1825 09/05/22 1826  WBC 9.3  --   --   NEUTROABS 6.2  --   --   HGB 10.8* 11.2* 11.6*  HCT 36.6 33.0* 34.0*  MCV 99.2  --   --   PLT 207  --   --     Basic Metabolic Panel: Recent Labs  Lab 09/05/22 1815 09/05/22 1825 09/05/22 1826  NA 136 136 136  K 3.7 3.6 3.7  CL 102 101  --   CO2 24  --   --   GLUCOSE 89 84  --   BUN 19 19  --   CREATININE 0.65 0.60  --   CALCIUM 8.0*  --   --    GFR: CrCl cannot be calculated (Unknown ideal weight.). Recent Labs  Lab 09/05/22 1815 09/05/22 2040  WBC 9.3  --   LATICACIDVEN 1.7 5.8*    Liver Function Tests: Recent Labs  Lab 09/05/22 1815  AST 31  ALT 28  ALKPHOS 64  BILITOT 0.4  PROT 5.5*  ALBUMIN 2.7*   No results for input(s): "LIPASE", "AMYLASE" in the last 168 hours. No results for input(s): "AMMONIA" in the last 168 hours.  ABG    Component Value Date/Time   PHART 7.498 (H) 11/28/2011 0920   PCO2ART 38.6 11/28/2011 0920   PO2ART 83.0 11/28/2011 0920   HCO3 25.4 09/05/2022 1826   TCO2 27  09/05/2022 1826   ACIDBASEDEF 1.3 11/26/2011 1845   O2SAT 71 09/05/2022 1826     Coagulation Profile: Recent Labs  Lab 09/05/22 1815  INR 1.0    Cardiac Enzymes: No results for input(s): "CKTOTAL", "CKMB", "CKMBINDEX", "TROPONINI" in the last 168 hours.  HbA1C: Hgb A1c MFr Bld  Date/Time Value Ref Range Status  10/11/2011 12:54 PM 5.5 <5.7 % Final    Comment:    (NOTE)                                                                       According to the ADA Clinical Practice Recommendations for 2011, when HbA1c is used as a screening  test:  >=6.5%   Diagnostic of Diabetes Mellitus           (if abnormal result is confirmed) 5.7-6.4%   Increased risk of developing Diabetes Mellitus References:Diagnosis and Classification of Diabetes Mellitus,Diabetes SAYT,0160,10(XNATF 1):S62-S69 and Standards of Medical Care in         Diabetes - 2011,Diabetes TDDU,2025,42 (Suppl 1):S11-S61.    CBG: Recent Labs  Lab 09/05/22 1822  GLUCAP 84    Review of Systems:   12 point review of systems significant for complaint mentioned HPI, rest is negative  Past Medical History:  She,  has a past medical history of Astrocytoma brain tumor ([place]) (1971), C. difficile colitis (11/2011), Depression, Hyperlipidemia, Mitral regurgitation (10/2011), Mitral valve prolapse, Neuromuscular disorder ([place]), Osteoarthritis, Osteoporosis, Overactive bladder, Pleural effusion, left (11/11/2011), and Vitamin D deficiency.   Surgical History:   Past Surgical History:  Procedure Laterality Date   CARDIAC CATHETERIZATION     Jan 2013   KNEE ARTHROSCOPY Right    MITRAL VALVE REPAIR  10/15/2011   Procedure: MINIMALLY INVASIVE MITRAL VALVE REPAIR (MVR);  Surgeon: Rexene Alberts, MD;  Location: [facility];  Service: Open Heart Surgery;  Laterality: Right;   TRANSESOPHAGEAL ECHOCARDIOGRAM  7/12   US ECHOCARDIOGRAPHY  6/12   VENTRICULOPERITONEAL SHUNT  1971     Social History:   reports that she quit smoking about 32  years ago. She has never used smokeless tobacco. She reports that she does not currently use alcohol. She reports that she does not use drugs.   Family History:  Her family history includes Coronary artery disease (age of onset: 60) in her father; Heart attack in her maternal grandfather; Hyperlipidemia in her mother.   Allergies Allergies  Allergen Reactions   Compazine Other (See Comments)    "eyes get buggy"   Prochlorperazine    Prochlorperazine Edisylate    Prochlorperazine Maleate      Home Medications  Prior to Admission medications   Medication Sig Start Date End Date Taking? Authorizing Provider  atorvastatin (LIPITOR) 20 MG tablet Take 20 mg by mouth daily.     [provider]  bisacodyl (DULCOLAX) 10 MG suppository Constipation (2 of 4): If not relieved by MOM, give 10 mg Bisacodyl suppositiory rectally X 1 dose in 24 hours as needed (Do not use constipation standing orders for residents with renal failure/CFR less than 30. Contact MD for orders)    [provider]  cholecalciferol (VITAMIN D) 1000 units tablet Take 1,000 Units by mouth daily.    [provider]  clonazePAM (KLONOPIN) 0.25 MG disintegrating tablet Take 1 tablet (0.25 mg total) by mouth 2 (two) times daily. 06/09/19   Medina-Vargas, Monina C, NP  Cranberry 125 MG TABS Take by mouth.    [provider]  Docusate Sodium (COLACE PO) Take by mouth.    [provider]  escitalopram (LEXAPRO) 10 MG tablet Take 1 tablet (10 mg total) by mouth daily. 01/06/17   Martinique, Betty G, MD  linaclotide [facility]) 145 MCG CAPS capsule TAKE ONE CAPSULE BY MOUTH DAILY BEFORE BREAKFAST Patient not taking: Reported on 09/27/2019 01/06/17   Martinique, Betty G, MD  loratadine (CLARITIN) 10 MG tablet Take 10 mg by mouth daily.    [provider]  Menthol, Topical Analgesic, (BIOFREEZE) 4 % GEL Apply 1 application topically 2 (two) times daily. Apply topically BID to sore areas - back     [provider]  polyvinyl alcohol (LIQUIFILM TEARS) 1.4 % ophthalmic solution Place  1 drop into both eyes 4 (four) times daily.     [provider]  sulfamethoxazole-trimethoprim (BACTRIM DS) 800-160 MG tablet Take 1 tablet by mouth 2 (two) times daily. 08/28/20   [provider]  White Petrolatum-Mineral Oil ([place] P.M. OP) Apply 1 application to eye at bedtime. Apply to right eye    [provider]     Critical care time:     This patient is critically ill with multiple organ system failure which requires frequent high complexity decision making, assessment, support, evaluation, and titration of therapies. This was completed through the application of advanced monitoring technologies and extensive interpretation of multiple databases.  During this encounter critical care time was devoted to patient care services described in this note for 42 minutes.    [name], MD [facility] See Amion for pager If no response to pager, please call [phone] until 7pm After 7pm, Please call E-link [phone]

## 2022-09-06 ENCOUNTER — Inpatient Hospital Stay (HOSPITAL_COMMUNITY): Payer: Medicare Other

## 2022-09-06 DIAGNOSIS — I4891 Unspecified atrial fibrillation: Secondary | ICD-10-CM

## 2022-09-06 DIAGNOSIS — I340 Nonrheumatic mitral (valve) insufficiency: Secondary | ICD-10-CM | POA: Diagnosis not present

## 2022-09-06 DIAGNOSIS — I749 Embolism and thrombosis of unspecified artery: Secondary | ICD-10-CM

## 2022-09-06 DIAGNOSIS — I342 Nonrheumatic mitral (valve) stenosis: Secondary | ICD-10-CM

## 2022-09-06 DIAGNOSIS — I513 Intracardiac thrombosis, not elsewhere classified: Secondary | ICD-10-CM

## 2022-09-06 DIAGNOSIS — J210 Acute bronchiolitis due to respiratory syncytial virus: Secondary | ICD-10-CM

## 2022-09-06 LAB — CBC
HCT: 31.2 % — ABNORMAL LOW (ref 36.0–46.0)
Hemoglobin: 9.8 g/dL — ABNORMAL LOW (ref 12.0–15.0)
MCH: 29.8 pg (ref 26.0–34.0)
MCHC: 31.4 g/dL (ref 30.0–36.0)
MCV: 94.8 fL (ref 80.0–100.0)
Platelets: 182 10*3/uL (ref 150–400)
RBC: 3.29 MIL/uL — ABNORMAL LOW (ref 3.87–5.11)
RDW: 13.5 % (ref 11.5–15.5)
WBC: 7.9 10*3/uL (ref 4.0–10.5)
nRBC: 0 % (ref 0.0–0.2)

## 2022-09-06 LAB — URINALYSIS, ROUTINE W REFLEX MICROSCOPIC
Bacteria, UA: NONE SEEN
Bilirubin Urine: NEGATIVE
Glucose, UA: NEGATIVE mg/dL
Ketones, ur: NEGATIVE mg/dL
Nitrite: NEGATIVE
Protein, ur: 100 mg/dL — AB
RBC / HPF: >50 RBC/hpf — ABNORMAL HIGH (ref 0–5)
Specific Gravity, Urine: >1.046 — ABNORMAL HIGH (ref 1.005–1.030)
WBC, UA: >50 WBC/hpf — ABNORMAL HIGH (ref 0–5)
pH: 6 (ref 5.0–8.0)

## 2022-09-06 LAB — HEPARIN LEVEL (UNFRACTIONATED)
Heparin Unfractionated: 0.42 IU/mL (ref 0.30–0.70)
Heparin Unfractionated: 0.43 IU/mL (ref 0.30–0.70)

## 2022-09-06 LAB — PHOSPHORUS: Phosphorus: 2.6 mg/dL (ref 2.5–4.6)

## 2022-09-06 LAB — ECHOCARDIOGRAM COMPLETE
AR max vel: 2.88 cm2
AV Area VTI: 2.89 cm2
AV Area mean vel: 2.75 cm2
AV Mean grad: 1 mmHg
AV Peak grad: 2.5 mmHg
Ao pk vel: 0.8 m/s
Height: 57 in
MV VTI: 0.56 cm2
S' Lateral: 3.2 cm
Weight: 1696.66 oz

## 2022-09-06 LAB — BASIC METABOLIC PANEL
Anion gap: 8 (ref 5–15)
BUN: 15 mg/dL (ref 8–23)
CO2: 23 mmol/L (ref 22–32)
Calcium: 7.6 mg/dL — ABNORMAL LOW (ref 8.9–10.3)
Chloride: 105 mmol/L (ref 98–111)
Creatinine, Ser: 0.49 mg/dL (ref 0.44–1.00)
GFR, Estimated: >60 mL/min (ref >60)
Glucose, Bld: 93 mg/dL (ref 70–99)
Potassium: 3.6 mmol/L (ref 3.5–5.1)
Sodium: 136 mmol/L (ref 135–145)

## 2022-09-06 LAB — HIV ANTIBODY (ROUTINE TESTING W REFLEX): HIV Screen 4th Generation wRfx: NONREACTIVE

## 2022-09-06 LAB — LACTIC ACID, PLASMA
Lactic Acid, Venous: 1 mmol/L (ref 0.5–1.9)
Lactic Acid, Venous: 1.2 mmol/L (ref 0.5–1.9)

## 2022-09-06 LAB — GLUCOSE, CAPILLARY
Glucose-Capillary: 79 mg/dL (ref 70–99)
Glucose-Capillary: 88 mg/dL (ref 70–99)

## 2022-09-06 LAB — TROPONIN I (HIGH SENSITIVITY): Troponin I (High Sensitivity): 5 ng/L (ref <18)

## 2022-09-06 LAB — MAGNESIUM: Magnesium: 1.7 mg/dL (ref 1.7–2.4)

## 2022-09-06 MED ORDER — ORAL CARE MOUTH RINSE: 15.0000 mL | OROMUCOSAL | Status: DC | PRN

## 2022-09-06 MED ORDER — MAGNESIUM SULFATE 2 GM/50ML IV SOLN
2.0000 g | Freq: Once | INTRAVENOUS | Status: AC
  Administered 2022-09-06: 2 g via INTRAVENOUS
  Filled 2022-09-06: qty 50

## 2022-09-06 MED ORDER — CHLORHEXIDINE GLUCONATE CLOTH 2 % EX PADS
6.0000 | MEDICATED_PAD | Freq: Every day | CUTANEOUS | Status: DC
  Administered 2022-09-06 – 2022-09-10 (×3): 6 via TOPICAL

## 2022-09-06 MED ORDER — POTASSIUM CHLORIDE 10 MEQ/100ML IV SOLN
10.0000 meq | INTRAVENOUS | Status: AC
  Administered 2022-09-06 (×3): 10 meq via INTRAVENOUS
  Filled 2022-09-06 (×3): qty 100

## 2022-09-06 MED ORDER — FUROSEMIDE 10 MG/ML IJ SOLN
20.0000 mg | Freq: Once | INTRAMUSCULAR | Status: AC
  Administered 2022-09-06: 20 mg via INTRAVENOUS
  Filled 2022-09-06: qty 2

## 2022-09-06 MED ORDER — CLONAZEPAM 0.5 MG PO TABS
0.5000 mg | ORAL_TABLET | Freq: Every day | ORAL | Status: DC | PRN
  Administered 2022-09-06: 0.5 mg via ORAL
  Filled 2022-09-06: qty 1

## 2022-09-06 MED ORDER — DIGOXIN 125 MCG PO TABS
0.1250 mg | ORAL_TABLET | Freq: Every day | ORAL | Status: DC
  Administered 2022-09-07 – 2022-09-10 (×4): 0.125 mg via ORAL
  Filled 2022-09-06 (×5): qty 1

## 2022-09-06 MED ORDER — METOPROLOL TARTRATE 25 MG PO TABS
25.0000 mg | ORAL_TABLET | Freq: Four times a day (QID) | ORAL | Status: DC
  Administered 2022-09-06 – 2022-09-08 (×7): 25 mg via ORAL
  Filled 2022-09-06 (×7): qty 1

## 2022-09-06 NOTE — Progress Notes (Addendum)
ANTICOAGULATION CONSULT NOTE  Pharmacy Consult for heparin Indication: Atrial thrombus with possible splenic infarct, Afib RVR  Allergies  Allergen Reactions   Compazine Other (See Comments)    "eyes get buggy"   Prochlorperazine    Prochlorperazine Edisylate    Prochlorperazine Maleate     Patient Measurements: Height: '4\' 9"'$  (144.8 cm) Weight: 48.1 kg (106 lb 0.7 oz) IBW/kg (Calculated) : 38.6 Heparin Dosing Weight: TBW  Vital Signs: Temp: 98.5 F (36.9 C) (12/29 0700) Temp Source: Oral (12/29 0700) BP: 94/81 (12/29 0640) Pulse Rate: 138 (12/29 0615)  Labs: Recent Labs    09/05/22 1815 09/05/22 1825 09/05/22 1826 09/05/22 2040 09/05/22 2247 09/06/22 0125 09/06/22 0535  HGB 10.8* 11.2* 11.6*  --   --  9.8*  --   HCT 36.6 33.0* 34.0*  --   --  31.2*  --   PLT 207  --   --   --   --  182  --   APTT 26  --   --   --   --   --   --   LABPROT 13.4  --   --   --   --   --   --   INR 1.0  --   --   --   --   --   --   HEPARINUNFRC  --   --   --   --   --   --  0.42  CREATININE 0.65 0.60  --   --   --  0.49  --   TROPONINIHS  --   --   --  <2 5  --   --      Estimated Creatinine Clearance: 47.6 mL/min (by C-G formula based on SCr of 0.49 mg/dL).   Assessment: 23 YOF presenting with SOB, CT shows large atrial thrombus and possible splenic infarct.  Also has Afib RVR.  Pharmacy consulted to dose IV heparin.  Heparin level therapeutic; no bleeding reported.  Goal of Therapy:  Heparin level 0.3-0.7 units/ml Monitor platelets by anticoagulation protocol: Yes   Plan:  Continue heparin gtt at 700 units/hr  Check confirmatory heparin level  Davia Smyre D. Mina Marble, PharmD, BCPS, [place] 09/06/2022, 8:55 AM  =========================================  Addendum: Confirmatory heparin level remains therapeutic Continue heparin without change F/U AM labs  Abeer Iversen D. Mina Marble, PharmD, BCPS, [place] 09/06/2022, 1:49 PM

## 2022-09-06 NOTE — Progress Notes (Signed)
Received patient on the unit at 06:55 am. Pt is alert. Bed alarm on and call bell within reach. Report given to day shift RN.

## 2022-09-06 NOTE — Progress Notes (Signed)
NAME:  Michaela [name], MRN:  [id number], DOB:  May 27, 1956, LOS: 1 ADMISSION DATE:  09/05/2022, CONSULTATION DATE: 09/05/2022 REFERRING MD:  Lianne Cure, DO , CHIEF COMPLAINT: Increasing shortness of breath  History of Present Illness:  66 year old wheelchair-bound female with mitral valve prolapse and severe MR s/p repair and annuloplasty, history of astrocytoma s/p radiation, and surgical resection, complicated with obstructive hydrocephalus, now with VP shunt who was brought into the emergency department from skilled care nursing facility with a complaint of cough and increasing shortness of breath for last few days.  Per patient's sister who is [place], she was seen by patient's primary care physician, had lab work done which showed elevated white count, was placed on Bactrim considering she may have UTI but over the last few days she started with cough and increasing shortness of breath and today she was found confused, so she was sent to the emergency department for evaluation.  In the emergency department she was noted to be in A-fib with RVR with heart rate in 150s, hypoxic requiring nasal cannula oxygen.  On further workup she was noted to have left atrial enlargement with left atrial appendage clot and splenic infarct, PCCM was consulted for help evaluation medical management  Pertinent  Medical History   Past Medical History:  Diagnosis Date   Astrocytoma brain tumor ([place]) 1971   astrocytoma treated with radiation   C. difficile colitis 01/36   complicated by sepsis/ARDS and prolonged hosp   Depression    Hyperlipidemia    Mitral regurgitation 10/2011   severe, s/p min invasive repair and annuloplasty   Mitral valve prolapse    Neuromuscular disorder (HCC)    numbness in hands and feet   Osteoarthritis    Osteoporosis    DEXA @ [facility] 11/2012: -3.5, prior tx Reclast, last in 2011, rec change to Prolia   Overactive bladder    Pleural effusion, left 11/11/2011   Vitamin D  deficiency      Significant Hospital Events: Including procedures, antibiotic start and stop dates in addition to other pertinent events   12/28 admitted with Afib RVR and RSV  Interim History / Subjective:  Pt awake, less confused, no pressor requirement 5L [place] in no distress  Objective   Blood pressure 105/79, pulse (!) 134, temperature 98.5 F (36.9 C), temperature source Oral, resp. rate 11, height '4\' 9"'$  (1.448 m), weight 48.1 kg, SpO2 98 %.        Intake/Output Summary (Last 24 hours) at 09/06/2022 0957 Last data filed at 09/06/2022 0900 Gross per 24 hour  Intake 697.19 ml  Output --  Net 697.19 ml    Filed Weights   09/06/22 0700  Weight: 48.1 kg    General:  chronically ill-appearing F resting in no acute distress HEENT: MM pink/moist, sclera anicteric, pupils equal Neuro: awake, oriented to person, speech clear, following commands CV: s1s2 rrr, no m/r/g PULM:  course breath sounds bilaterally without significant wheezing or rhonchi  GI: soft, non-tender  Extremities: warm/dry, 1+ edema  Skin: no rashes or lesions     Resolved Hospital Problem list     Assessment & Plan:   Acute hypoxic respiratory failure Sepsis due to RSV pneumonia -respiratory status is stable  -Continue nasal cannula oxygen with O2 sat goal 92% -received 30cc/kg, stop further IVF  -droplet precautions - hold off on antibiotic for now -stable for transfer out of ICU   Paroxysmal atrial fibrillation with rapid ventricular response Large left atrial thrombus Acute  splenic infarction Lactic acidosis MVP/MVR Lactic down to 1.2 -started on dig overnight with improved HR -Patient blood pressure is borderline at this time so we will hold off on beta-blocker therapy -CT chest and bedside echocardiogram showed large LA with thrombus -echo pending -continue  IV heparin infusion -cardiology following -may need TEE to evaluate mitral valve as patient had minimally invasive mitral  valve repair with annuloplasty in the past     Astrocytoma s/p resection, obstructive hydrocephalus now with VP shunt -supportive care, mental status improving per sister   Best Practice (right click and "Reselect all SmartList Selections" daily)   Diet/type: NPO w/ oral meds DVT prophylaxis: systemic heparin GI prophylaxis: N/A Lines: N/A Foley:  N/A Code Status:  limited DNR but intubation is okay Last date of multidisciplinary goals of care discussion [12/28: Patient's sister was updated at bedside, she would want to keep patient DNR but if she needs ventilator support, patient's sister stated to proceed with it] Sister updated at the bedside 12/29  Labs   CBC: Recent Labs  Lab 09/05/22 1815 09/05/22 1825 09/05/22 1826 09/06/22 0125  WBC 9.3  --   --  7.9  NEUTROABS 6.2  --   --   --   HGB 10.8* 11.2* 11.6* 9.8*  HCT 36.6 33.0* 34.0* 31.2*  MCV 99.2  --   --  94.8  PLT 207  --   --  182     Basic Metabolic Panel: Recent Labs  Lab 09/05/22 1815 09/05/22 1825 09/05/22 1826 09/06/22 0125  NA 136 136 136 136  K 3.7 3.6 3.7 3.6  CL 102 101  --  105  CO2 24  --   --  23  GLUCOSE 89 84  --  93  BUN 19 19  --  15  CREATININE 0.65 0.60  --  0.49  CALCIUM 8.0*  --   --  7.6*  MG  --   --   --  1.7  PHOS  --   --   --  2.6    GFR: Estimated Creatinine Clearance: 47.6 mL/min (by C-G formula based on SCr of 0.49 mg/dL). Recent Labs  Lab 09/05/22 1815 09/05/22 2040 09/06/22 0125 09/06/22 0535  WBC 9.3  --  7.9  --   LATICACIDVEN 1.7 5.8* 1.0 1.2     Liver Function Tests: Recent Labs  Lab 09/05/22 1815  AST 31  ALT 28  ALKPHOS 64  BILITOT 0.4  PROT 5.5*  ALBUMIN 2.7*    No results for input(s): "LIPASE", "AMYLASE" in the last 168 hours. No results for input(s): "AMMONIA" in the last 168 hours.  ABG    Component Value Date/Time   PHART 7.498 (H) 11/28/2011 0920   PCO2ART 38.6 11/28/2011 0920   PO2ART 83.0 11/28/2011 0920   HCO3 25.4  09/05/2022 1826   TCO2 27 09/05/2022 1826   ACIDBASEDEF 1.3 11/26/2011 1845   O2SAT 71 09/05/2022 1826     Coagulation Profile: Recent Labs  Lab 09/05/22 1815  INR 1.0     Cardiac Enzymes: No results for input(s): "CKTOTAL", "CKMB", "CKMBINDEX", "TROPONINI" in the last 168 hours.  HbA1C: Hgb A1c MFr Bld  Date/Time Value Ref Range Status  10/11/2011 12:54 PM 5.5 <5.7 % Final    Comment:    (NOTE)  According to the ADA Clinical Practice Recommendations for 2011, when HbA1c is used as a screening test:  >=6.5%   Diagnostic of Diabetes Mellitus           (if abnormal result is confirmed) 5.7-6.4%   Increased risk of developing Diabetes Mellitus References:Diagnosis and Classification of Diabetes Mellitus,Diabetes JJKK,9381,82(XHBZJ 1):S62-S69 and Standards of Medical Care in         Diabetes - 2011,Diabetes IRCV,8938,10 (Suppl 1):S11-S61.    CBG: Recent Labs  Lab 09/05/22 1822 09/06/22 0729  GLUCAP 84 79     Review of Systems:   12 point review of systems significant for complaint mentioned HPI, rest is negative  Past Medical History:  She,  has a past medical history of Astrocytoma brain tumor ([place]) (1971), C. difficile colitis (11/2011), Depression, Hyperlipidemia, Mitral regurgitation (10/2011), Mitral valve prolapse, Neuromuscular disorder ([place]), Osteoarthritis, Osteoporosis, Overactive bladder, Pleural effusion, left (11/11/2011), and Vitamin D deficiency.   Surgical History:   Past Surgical History:  Procedure Laterality Date   CARDIAC CATHETERIZATION     Jan 2013   KNEE ARTHROSCOPY Right    MITRAL VALVE REPAIR  10/15/2011   Procedure: MINIMALLY INVASIVE MITRAL VALVE REPAIR (MVR);  Surgeon: Rexene Alberts, MD;  Location: [facility];  Service: Open Heart Surgery;  Laterality: Right;   TRANSESOPHAGEAL ECHOCARDIOGRAM  7/12   US ECHOCARDIOGRAPHY  6/12   VENTRICULOPERITONEAL SHUNT  1971     Social  History:   reports that she quit smoking about 32 years ago. She has never used smokeless tobacco. She reports that she does not currently use alcohol. She reports that she does not use drugs.   Family History:  Her family history includes Coronary artery disease (age of onset: 68) in her father; Heart attack in her maternal grandfather; Hyperlipidemia in her mother.   Allergies Allergies  Allergen Reactions   Compazine Other (See Comments)    "eyes get buggy"   Prochlorperazine    Prochlorperazine Edisylate    Prochlorperazine Maleate      Home Medications  Prior to Admission medications   Medication Sig Start Date End Date Taking? Authorizing Provider  atorvastatin (LIPITOR) 20 MG tablet Take 20 mg by mouth daily.     [provider]  bisacodyl (DULCOLAX) 10 MG suppository Constipation (2 of 4): If not relieved by MOM, give 10 mg Bisacodyl suppositiory rectally X 1 dose in 24 hours as needed (Do not use constipation standing orders for residents with renal failure/CFR less than 30. Contact MD for orders)    [provider]  cholecalciferol (VITAMIN D) 1000 units tablet Take 1,000 Units by mouth daily.    [provider]  clonazePAM (KLONOPIN) 0.25 MG disintegrating tablet Take 1 tablet (0.25 mg total) by mouth 2 (two) times daily. 06/09/19   Medina-Vargas, Monina C, NP  Cranberry 125 MG TABS Take by mouth.    [provider]  Docusate Sodium (COLACE PO) Take by mouth.    [provider]  escitalopram (LEXAPRO) 10 MG tablet Take 1 tablet (10 mg total) by mouth daily. 01/06/17   Martinique, Betty G, MD  linaclotide [facility]) 145 MCG CAPS capsule TAKE ONE CAPSULE BY MOUTH DAILY BEFORE BREAKFAST Patient not taking: Reported on 09/27/2019 01/06/17   [place], Betty G, MD  loratadine (CLARITIN) 10 MG tablet Take 10 mg by mouth daily.    [provider]  Menthol, Topical Analgesic, (BIOFREEZE) 4 % GEL Apply 1 application topically 2 (two) times  daily. Apply topically BID to  sore areas - back    [provider]  polyvinyl alcohol (LIQUIFILM TEARS) 1.4 % ophthalmic solution Place 1 drop into both eyes 4 (four) times daily.     [provider]  sulfamethoxazole-trimethoprim (BACTRIM DS) 800-160 MG tablet Take 1 tablet by mouth 2 (two) times daily. 08/28/20   [provider]  White Petrolatum-Mineral Oil ([place] P.M. OP) Apply 1 application to eye at bedtime. Apply to right eye    [provider]     Critical care time:  n/a     Otilio Carpen Mac Dowdell, PA-C [place] [facility] See Amion for pager If no response to pager , please call [phone] until 7pm After 7:00 pm call Elink  [phone]

## 2022-09-06 NOTE — Progress Notes (Signed)
Notified sister of patients transfer. Gave patient incentive spirometry and Flutter valve. She used both

## 2022-09-06 NOTE — Progress Notes (Signed)
Cardiology Progress Note  Patient ID: KATI RIGGENBACH MRN: [id number] DOB: 19-May-1956 Date of Encounter: 09/06/2022  Primary Cardiologist: None  Subjective   Chief Complaint: Cough/SOB  HPI: A-fib not controlled.  Reports shortness of breath  ROS:  All other ROS reviewed and negative. Pertinent positives noted in the HPI.     Inpatient Medications  Scheduled Meds:  Chlorhexidine Gluconate Cloth  6 each Topical Q0600   digoxin  0.125 mg Oral Daily   melatonin  3 mg Oral QHS   metoprolol tartrate  25 mg Oral Q6H   Continuous Infusions:  heparin 700 Units/hr (09/06/22 0900)   lactated ringers 150 mL/hr at 09/06/22 0900   potassium chloride 10 mEq (09/06/22 1224)   PRN Meds: docusate sodium, mouth rinse, polyethylene glycol   Vital Signs   Vitals:   09/06/22 0830 09/06/22 0900 09/06/22 1200 09/06/22 1203  BP: (!) 112/91 105/79 103/82   Pulse: (!) 138 (!) 134 (!) 119   Resp: (!) '22 11 14   '$ Temp:    98.1 F (36.7 C)  TempSrc:    Oral  SpO2: 100% 98% 97%   Weight:      Height:        Intake/Output Summary (Last 24 hours) at 09/06/2022 1244 Last data filed at 09/06/2022 0900 Gross per 24 hour  Intake 697.19 ml  Output --  Net 697.19 ml      09/06/2022    7:00 AM 08/31/2020    7:32 PM 09/27/2019    8:25 AM  Last 3 Weights  Weight (lbs) 106 lb 0.7 oz 92 lb 9.5 oz 93 lb  Weight (kg) 48.1 kg 42 kg 42.185 kg      Telemetry  Overnight telemetry shows A-fib heart rates 110 to 130 bpm, which I personally reviewed.   ECG  The most recent ECG shows A-fib heart rate 143, no acute ischemic changes or evidence of infarction, which I personally reviewed.   Physical Exam   Vitals:   09/06/22 0830 09/06/22 0900 09/06/22 1200 09/06/22 1203  BP: (!) 112/91 105/79 103/82   Pulse: (!) 138 (!) 134 (!) 119   Resp: (!) '22 11 14   '$ Temp:    98.1 F (36.7 C)  TempSrc:    Oral  SpO2: 100% 98% 97%   Weight:      Height:        Intake/Output Summary (Last 24 hours)  at 09/06/2022 1244 Last data filed at 09/06/2022 0900 Gross per 24 hour  Intake 697.19 ml  Output --  Net 697.19 ml       09/06/2022    7:00 AM 08/31/2020    7:32 PM 09/27/2019    8:25 AM  Last 3 Weights  Weight (lbs) 106 lb 0.7 oz 92 lb 9.5 oz 93 lb  Weight (kg) 48.1 kg 42 kg 42.185 kg    Body mass index is 22.95 kg/m.  General: Well nourished, well developed, in no acute distress Head: Atraumatic, normal size  Eyes: PEERLA, EOMI  Neck: Supple, no JVD Endocrine: No thryomegaly Cardiac: Normal irregular rhythm, no murmurs Lungs: Diminished breath sounds bilaterally, diffuse rhonchi and rails Abd: Soft, nontender, no hepatomegaly  Ext: No edema, pulses 2+ Musculoskeletal: No deformities, BUE and BLE strength normal and equal Skin: Warm and dry, no rashes   Neuro: Alert and oriented to person, place, time, and situation, CNII-XII grossly intact, no focal deficits  Psych: Normal mood and affect   Labs  High Sensitivity Troponin:   Recent  Labs  Lab 09/05/22 2040 09/05/22 2247  TROPONINIHS <2 5     Cardiac EnzymesNo results for input(s): "TROPONINI" in the last 168 hours. No results for input(s): "TROPIPOC" in the last 168 hours.  Chemistry Recent Labs  Lab 09/05/22 1815 09/05/22 1825 09/05/22 1826 09/06/22 0125  NA 136 136 136 136  K 3.7 3.6 3.7 3.6  CL 102 101  --  105  CO2 24  --   --  23  GLUCOSE 89 84  --  93  BUN 19 19  --  15  CREATININE 0.65 0.60  --  0.49  CALCIUM 8.0*  --   --  7.6*  PROT 5.5*  --   --   --   ALBUMIN 2.7*  --   --   --   AST 31  --   --   --   ALT 28  --   --   --   ALKPHOS 64  --   --   --   BILITOT 0.4  --   --   --   GFRNONAA >60  --   --  >60  ANIONGAP 10  --   --  8    Hematology Recent Labs  Lab 09/05/22 1815 09/05/22 1825 09/05/22 1826 09/06/22 0125  WBC 9.3  --   --  7.9  RBC 3.69*  --   --  3.29*  HGB 10.8* 11.2* 11.6* 9.8*  HCT 36.6 33.0* 34.0* 31.2*  MCV 99.2  --   --  94.8  MCH 29.3  --   --  29.8  MCHC  29.5*  --   --  31.4  RDW 13.5  --   --  13.5  PLT 207  --   --  182   BNP Recent Labs  Lab 09/05/22 1815  BNP 706.4*    DDimer No results for input(s): "DDIMER" in the last 168 hours.   Radiology  CT Angio Chest/Abd/Pel for Dissection W and/or Wo Contrast  Result Date: 09/05/2022 CLINICAL DATA:  Left atrial thrombus.  Altered mental status. EXAM: CT ANGIOGRAPHY CHEST, ABDOMEN AND PELVIS TECHNIQUE: Non-contrast CT of the chest was initially obtained. Multidetector CT imaging through the chest, abdomen and pelvis was performed using the standard protocol during bolus administration of intravenous contrast. Multiplanar reconstructed images and MIPs were obtained and reviewed to evaluate the vascular anatomy. RADIATION DOSE REDUCTION: This exam was performed according to the departmental dose-optimization program which includes automated exposure control, adjustment of the mA and/or kV according to patient size and/or use of iterative reconstruction technique. CONTRAST:  166m OMNIPAQUE IOHEXOL 350 MG/ML SOLN COMPARISON:  Earlier chest CT dated 09/05/2022. FINDINGS: Evaluation is limited due to streak artifact caused by patient's arms and dense contrast within the vessels. CTA CHEST FINDINGS Cardiovascular: Borderline cardiomegaly. There is large thrombus within the left atrial appendage as seen on the earlier CT. An intracardiac mass or malignancy is not excluded. Clinical correlation and follow-up recommended. No pericardial effusion. Mild atherosclerotic calcification of the thoracic aorta. No aneurysmal dilatation or dissection. The origins of the great vessels of the aortic arch appear patent as visualized. No pulmonary artery embolus identified. Mediastinum/Nodes: Borderline enlarged mediastinal lymph nodes in the prevascular space measure up to 11 mm short axis. The esophagus is grossly unremarkable. No mediastinal fluid collection. Lungs/Pleura: Small bilateral pleural effusions with associated  bibasilar compressive atelectasis. Infiltrate is not excluded. Diffuse interstitial and interlobular septal prominence consistent with edema. There is no pneumothorax. The central  airways are patent. Musculoskeletal: No acute osseous pathology. Review of the MIP images confirms the above findings. CTA ABDOMEN AND PELVIS FINDINGS VASCULAR Aorta: Moderate atherosclerotic calcification of the aorta. No aneurysmal dilatation or dissection. No periaortic fluid collection. Celiac: The celiac trunk and its major branches are patent. SMA: The SMA is patent. Renals: The renal arteries are patent. IMA: The IMA is patent. Inflow: The iliac arteries are patent. No aneurysmal dilatation or dissection. Veins: No obvious venous abnormality within the limitations of this arterial phase study. Review of the MIP images confirms the above findings. NON-VASCULAR No intra-abdominal free air or free fluid. Hepatobiliary: The liver is unremarkable. No biliary dilatation. Multiple layering gallstones. No pericholecystic fluid or evidence of acute cholecystitis by CT. Pancreas: Unremarkable. No pancreatic ductal dilatation or surrounding inflammatory changes. Spleen: A 2 cm hypoenhancing focus in the inferior pole of the spleen is suboptimally evaluated but may represent an infarct. Adrenals/Urinary Tract: The adrenal glands are unremarkable. The kidneys, visualized ureters appear unremarkable. There is diffuse trabeculated appearance of the bladder wall likely related to chronic bladder outlet obstruction. Stomach/Bowel: There is moderate stool throughout the colon. There is no bowel obstruction or active inflammation. The appendix is normal. Lymphatic: No adenopathy. Reproductive: The uterus is grossly unremarkable. No adnexal masses. Other: VP shunt tubes in the anterior abdomen with tips in the left lateral abdomen and right hemipelvis. No fluid collection. Musculoskeletal: Osteopenia with degenerative changes of the spine. Old  appearing mild compression fracture of superior endplate of L1. Review of the MIP images confirms the above findings. IMPRESSION: 1. No aortic aneurysm or dissection. 2. Large thrombus within the left atrial appendage as seen on the earlier CT. An intracardiac mass or malignancy is not excluded. 3. Small bilateral pleural effusions with associated bibasilar compressive atelectasis. Infiltrate is not excluded. 4. Possible small focal splenic infarct. 5. Cholelithiasis. 6. No bowel obstruction. Normal appendix. Electronically Signed   By: Anner Crete M.D.   On: 09/05/2022 21:37   CT Head Wo Contrast  Result Date: 09/05/2022 CLINICAL DATA:  Altered mental status EXAM: CT HEAD WITHOUT CONTRAST TECHNIQUE: Contiguous axial images were obtained from the base of the skull through the vertex without intravenous contrast. RADIATION DOSE REDUCTION: This exam was performed according to the departmental dose-optimization program which includes automated exposure control, adjustment of the mA and/or kV according to patient size and/or use of iterative reconstruction technique. COMPARISON:  10/16/2021 FINDINGS: Brain: Right frontal approach shunt catheter terminating at the right foramen of [place]. Right parietal approach shunt catheter tip along the roof of the right lateral ventricle. The sizes of the ventricles have markedly decreased since 10/16/2021. There is unchanged gliosis along the right frontal catheter tract. Unchanged postsurgical appearance of the posterior fossa and brainstem. No acute hemorrhage, mass or extra-axial collection. Chronic left posterior parietal encephalomalacia. Vascular: No hyperdense vessel or unexpected calcification. Skull: Right frontal and parietal burr holes. Remote suboccipital craniectomy. Sinuses/Orbits: No acute finding. Other: None. IMPRESSION: 1. No acute intracranial abnormality. 2. Marked decrease in size of the lateral ventricles compared to 10/16/2021. 3. Unchanged  postsurgical appearance of the posterior fossa and brainstem. Electronically Signed   By: Ulyses Jarred M.D.   On: 09/05/2022 21:28   CT Chest W Contrast  Result Date: 09/05/2022 CLINICAL DATA:  Respiratory illness. EXAM: CT CHEST WITH CONTRAST TECHNIQUE: Multidetector CT imaging of the chest was performed during intravenous contrast administration. RADIATION DOSE REDUCTION: This exam was performed according to the departmental dose-optimization program which includes automated exposure  control, adjustment of the mA and/or kV according to patient size and/or use of iterative reconstruction technique. CONTRAST:  19m OMNIPAQUE IOHEXOL 350 MG/ML SOLN COMPARISON:  Chest radiograph dated 09/05/2022. CT dated 09/24/2011. FINDINGS: Cardiovascular: Borderline cardiomegaly. No pericardial effusion. There is a large thrombus in the left atrium and left atrial appendage. Underlying mass is not excluded malignancy is not excluded. This can predispose to systemic embolization and infarcts. There is mild atherosclerotic calcification of the thoracic aorta. No aneurysmal dilatation or dissection. The central pulmonary arteries appear patent for the degree of opacification. Mediastinum/Nodes: No hilar adenopathy. Mildly enlarged mediastinal lymph node measure up to 11 mm in short axis in the prevascular space. The esophagus is grossly unremarkable. No mediastinal fluid collection. Lungs/Pleura: Small bilateral pleural effusions with partial compressive atelectasis of the lower lobes. There is diffuse interstitial and interlobular septal prominence consistent with edema. There is no pneumothorax. The central airways are patent. Upper Abdomen: A 2 cm hypoenhancing focus in the inferior spleen may represent a focus of infarct. Multiple gallstones. Musculoskeletal: Osteopenia with degenerative changes of the spine. Age indeterminate, likely old, compression fracture of the superior endplate of L1. No acute osseous pathology.  IMPRESSION: 1. Large thrombus in the left atrium and left atrial appendage. This can predispose to systemic embolization and infarcts. 2. Probable small focus of infarct in the spleen. If there is clinical concern for additional intra-abdominal infarcts, CT of the abdomen pelvis may provide better evaluation. 3. Small bilateral pleural effusions with partial compressive atelectasis of the lower lobes. 4. Interstitial and interlobular septal prominence consistent with edema. 5. Cholelithiasis. 6.  Aortic Atherosclerosis (ICD10-I70.0). These results were called by telephone at the time of interpretation on 09/05/2022 at 8:41 pm to provider ACampbell Stall, who verbally acknowledged these results. Electronically Signed   By: AAnner CreteM.D.   On: 09/05/2022 20:42   DG Chest Port 1 View  Result Date: 09/05/2022 CLINICAL DATA:  Questionable sepsis. EXAM: PORTABLE CHEST 1 VIEW COMPARISON:  Chest radiograph dated 04/01/2016. FINDINGS: Mild diffuse interstitial prominence, likely edema. Atypical infiltrate is less likely but not excluded. There is a small left pleural effusion. No pneumothorax. The cardiac silhouette is within normal limits. Cardiac valve repair. Atherosclerotic calcification of the arch. No acute osseous pathology. Right VP shunt. IMPRESSION: 1. Small left pleural effusion. 2. Mild diffuse interstitial prominence, likely edema. Electronically Signed   By: AAnner CreteM.D.   On: 09/05/2022 19:11    Cardiac Studies  Echo pending  Patient Profile  ECARYL FATEis a 66y.o. female with history of mitral valve prolapse status post complex mitral valve repair in 2013 (Alfieri edge-to-edge repair with Gore-Tex cords, 36 mm annuloplasty ring), childhood astrocytoma status post radiation with surgical resection complicated by obstructive hydrocephalus with VP shunt who was admitted on 09/05/2022 for acute hypoxic respiratory failure secondary to RSV and pneumonia.  Cardiology was consulted  for new onset A-fib with RVR left atrial appendage thrombus and splenic infarct.  Assessment & Plan   # New onset A-fib with RVR # Left atrial appendage thrombus with thromboembolic event # Splenic infarct # Status post mitral valve repair -Admitted with RSV and pneumonia.  Also with new onset A-fib.  Has evidence of splenic infarct and left atrial appendage thrombus. -Suspect this is related to A-fib. -Will continue heparin drip for now. -Follow-up echocardiogram.  If she has less than moderate mitral valve stenosis she would be a good candidate for DOAC.  If she has moderate or  greater mitral valve stenosis she will need transition to Coumadin therapy. -Echo in 2016 showed mild to moderate stenosis.  Suspect it was mild on my review.  Hopefully this is unchanged.  She has no significant murmur to suggest worsening mitral valve stenosis. -We will continue with rate control.  Transition digoxin 0.125 mg daily. -Add metoprolol to tartrate 25 mg every 6 hours. -Avoid amiodarone given left atrial Penders thrombus. -Plan will be rate control strategy.  If rhythm control strategy is pursued she will need transesophageal echo prior to that time. -Regarding transesophageal echo this admission really unclear how we will change her management.  We know she needs to be anticoagulated.  Rate control strategy is preferred.  If there is question about severity of mitral valve stenosis this could be done in the outpatient setting.  The bigger issue is RSV pneumonia.  Everything else can be treated medically.  # RSV/pneumonia -Management per the primary team. -BNP is elevated but no signs of worsening heart failure.  -We will give a one-time dose of 20 mg IV Lasix to see if this helps.  # Elevated BNP # Pleural effusions -Lasix 20 mg IV as a one-time dose.  Reassess volume status tomorrow.    For questions or updates, please contact [place] Please consult [email or web address] for contact info  under   Signed, [place] B[place] T. Audie Box, MD, [place]  09/06/2022 12:44 PM

## 2022-09-06 NOTE — Progress Notes (Signed)
Echocardiogram 2D Echocardiogram has been performed.  Michaela [name] 09/06/2022, 11:09 AM

## 2022-09-07 DIAGNOSIS — I4819 Other persistent atrial fibrillation: Secondary | ICD-10-CM

## 2022-09-07 DIAGNOSIS — I4891 Unspecified atrial fibrillation: Secondary | ICD-10-CM | POA: Diagnosis not present

## 2022-09-07 LAB — BASIC METABOLIC PANEL
Anion gap: 9 (ref 5–15)
BUN: 14 mg/dL (ref 8–23)
CO2: 26 mmol/L (ref 22–32)
Calcium: 8.1 mg/dL — ABNORMAL LOW (ref 8.9–10.3)
Chloride: 102 mmol/L (ref 98–111)
Creatinine, Ser: 0.55 mg/dL (ref 0.44–1.00)
GFR, Estimated: >60 mL/min (ref >60)
Glucose, Bld: 82 mg/dL (ref 70–99)
Potassium: 3.6 mmol/L (ref 3.5–5.1)
Sodium: 137 mmol/L (ref 135–145)

## 2022-09-07 LAB — CBC
HCT: 30.3 % — ABNORMAL LOW (ref 36.0–46.0)
Hemoglobin: 9.7 g/dL — ABNORMAL LOW (ref 12.0–15.0)
MCH: 29.4 pg (ref 26.0–34.0)
MCHC: 32 g/dL (ref 30.0–36.0)
MCV: 91.8 fL (ref 80.0–100.0)
Platelets: 187 10*3/uL (ref 150–400)
RBC: 3.3 MIL/uL — ABNORMAL LOW (ref 3.87–5.11)
RDW: 13.4 % (ref 11.5–15.5)
WBC: 6.1 10*3/uL (ref 4.0–10.5)
nRBC: 0 % (ref 0.0–0.2)

## 2022-09-07 LAB — MAGNESIUM: Magnesium: 2.2 mg/dL (ref 1.7–2.4)

## 2022-09-07 LAB — HEPARIN LEVEL (UNFRACTIONATED): Heparin Unfractionated: 0.35 IU/mL (ref 0.30–0.70)

## 2022-09-07 LAB — PHOSPHORUS: Phosphorus: 3.7 mg/dL (ref 2.5–4.6)

## 2022-09-07 MED ORDER — APIXABAN 5 MG PO TABS
5.0000 mg | ORAL_TABLET | Freq: Two times a day (BID) | ORAL | Status: DC
  Administered 2022-09-07 – 2022-09-13 (×13): 5 mg via ORAL
  Filled 2022-09-07 (×14): qty 1

## 2022-09-07 MED ORDER — ATORVASTATIN CALCIUM 10 MG PO TABS
20.0000 mg | ORAL_TABLET | Freq: Every day | ORAL | Status: DC
  Administered 2022-09-07 – 2022-09-13 (×7): 20 mg via ORAL
  Filled 2022-09-07 (×8): qty 2

## 2022-09-07 MED ORDER — CLONAZEPAM 0.5 MG PO TABS
0.5000 mg | ORAL_TABLET | Freq: Two times a day (BID) | ORAL | Status: DC | PRN
  Administered 2022-09-07 – 2022-09-08 (×2): 0.5 mg via ORAL
  Filled 2022-09-07 (×4): qty 1

## 2022-09-07 MED ORDER — ESCITALOPRAM OXALATE 10 MG PO TABS
20.0000 mg | ORAL_TABLET | Freq: Every day | ORAL | Status: DC
  Administered 2022-09-07 – 2022-09-13 (×7): 20 mg via ORAL
  Filled 2022-09-07 (×8): qty 2

## 2022-09-07 MED ORDER — POLYVINYL ALCOHOL 1.4 % OP SOLN
1.0000 [drp] | Freq: Four times a day (QID) | OPHTHALMIC | Status: DC
  Administered 2022-09-07 – 2022-09-13 (×24): 1 [drp] via OPHTHALMIC
  Filled 2022-09-07: qty 15

## 2022-09-07 MED ORDER — SACCHAROMYCES BOULARDII 250 MG PO CAPS
250.0000 mg | ORAL_CAPSULE | Freq: Two times a day (BID) | ORAL | Status: DC
  Administered 2022-09-07 – 2022-09-13 (×13): 250 mg via ORAL
  Filled 2022-09-07 (×14): qty 1

## 2022-09-07 MED ORDER — PANTOPRAZOLE SODIUM 40 MG PO TBEC
40.0000 mg | DELAYED_RELEASE_TABLET | Freq: Every day | ORAL | Status: DC
  Administered 2022-09-07 – 2022-09-13 (×7): 40 mg via ORAL
  Filled 2022-09-07 (×8): qty 1

## 2022-09-07 MED ORDER — GUAIFENESIN ER 600 MG PO TB12
600.0000 mg | ORAL_TABLET | Freq: Two times a day (BID) | ORAL | Status: DC
  Administered 2022-09-07 – 2022-09-11 (×9): 600 mg via ORAL
  Filled 2022-09-07 (×10): qty 1

## 2022-09-07 MED ORDER — MIRABEGRON ER 25 MG PO TB24
25.0000 mg | ORAL_TABLET | Freq: Every day | ORAL | Status: DC
  Administered 2022-09-08 – 2022-09-13 (×6): 25 mg via ORAL
  Filled 2022-09-07 (×7): qty 1

## 2022-09-07 NOTE — Progress Notes (Signed)
Rounding Note    Patient Name: Michaela [name] Date of Encounter: 09/07/2022  [place] Cardiologist: Dr Audie Box  Subjective   No CP or dyspnea  Inpatient Medications    Scheduled Meds:  atorvastatin  20 mg Oral Daily   Chlorhexidine Gluconate Cloth  6 each Topical Q0600   digoxin  0.125 mg Oral Daily   escitalopram  20 mg Oral Daily   guaiFENesin  600 mg Oral BID   melatonin  3 mg Oral QHS   metoprolol tartrate  25 mg Oral Q6H   mirabegron ER  25 mg Oral Daily   pantoprazole  40 mg Oral Daily   polyvinyl alcohol  1 drop Both Eyes QID   saccharomyces boulardii  250 mg Oral BID   Continuous Infusions:  heparin 700 Units/hr (09/07/22 0618)   PRN Meds: clonazePAM, docusate sodium, mouth rinse, polyethylene glycol   Vital Signs    Vitals:   09/06/22 1943 09/07/22 0018 09/07/22 0421 09/07/22 0816  BP: 99/77 (!) 84/64 (!) 86/57 103/73  Pulse: 86 (!) 131 85 (!) 119  Resp: '18 17 12 12  '$ Temp: 98.1 F (36.7 C) 98 F (36.7 C) 98.4 F (36.9 C) 98.2 F (36.8 C)  TempSrc: Oral Oral Axillary Oral  SpO2: 97% 98% 98% 100%  Weight:  47.6 kg    Height:        Intake/Output Summary (Last 24 hours) at 09/07/2022 1201 Last data filed at 09/07/2022 0618 Gross per 24 hour  Intake 807.92 ml  Output 2000 ml  Net -1192.08 ml      09/07/2022   12:18 AM 09/06/2022    7:00 AM 08/31/2020    7:32 PM  Last 3 Weights  Weight (lbs) 104 lb 15 oz 106 lb 0.7 oz 92 lb 9.5 oz  Weight (kg) 47.6 kg 48.1 kg 42 kg      Telemetry    Atrial fibrillation rate controlled - Personally Reviewed   Physical Exam   GEN: No acute distress.   Neck: No JVD Cardiac: irregular, mildly tachycardic Respiratory: Clear to auscultation bilaterally. GI: Soft, nontender, non-distended  MS: No edema; deformities noted Neuro:  Nonfocal   Labs    High Sensitivity Troponin:   Recent Labs  Lab 09/05/22 2040 09/05/22 2247  TROPONINIHS <2 5     Chemistry Recent Labs  Lab  09/05/22 1815 09/05/22 1825 09/05/22 1826 09/06/22 0125 09/07/22 0052  NA 136 136 136 136 137  K 3.7 3.6 3.7 3.6 3.6  CL 102 101  --  105 102  CO2 24  --   --  23 26  GLUCOSE 89 84  --  93 82  BUN 19 19  --  15 14  CREATININE 0.65 0.60  --  0.49 0.55  CALCIUM 8.0*  --   --  7.6* 8.1*  MG  --   --   --  1.7 2.2  PROT 5.5*  --   --   --   --   ALBUMIN 2.7*  --   --   --   --   AST 31  --   --   --   --   ALT 28  --   --   --   --   ALKPHOS 64  --   --   --   --   BILITOT 0.4  --   --   --   --   GFRNONAA >60  --   --  >60 >  [place]  --   --  8 9     Hematology Recent Labs  Lab 09/05/22 1815 09/05/22 1825 09/05/22 1826 09/06/22 0125 09/07/22 0052  WBC 9.3  --   --  7.9 6.1  RBC 3.69*  --   --  3.29* 3.30*  HGB 10.8*   < > 11.6* 9.8* 9.7*  HCT 36.6   < > 34.0* 31.2* 30.3*  MCV 99.2  --   --  94.8 91.8  MCH 29.3  --   --  29.8 29.4  MCHC 29.5*  --   --  31.4 32.0  RDW 13.5  --   --  13.5 13.4  PLT 207  --   --  182 187   < > = values in this interval not displayed.   BNP Recent Labs  Lab 09/05/22 1815  BNP 706.4*     Radiology    ECHOCARDIOGRAM COMPLETE  Result Date: 09/06/2022    ECHOCARDIOGRAM REPORT   Patient Name:   Real Cons Wrightson Date of Exam: 09/06/2022 Medical Rec #:  [id number]           Height:       59.0 in Accession #:    [id number]          Weight:       106.0 lb Date of Birth:  29-Jul-1956           BSA:          1.408 m Patient Age:    57 years            BP:           105/79 mmHg Patient Gender: F                   HR:           137 bpm. Exam Location:  Inpatient Procedure: 2D Echo, Cardiac Doppler and Color Doppler Indications:    Mitral Valve Disorder I05.9  History:        Patient has prior history of Echocardiogram examinations. CHF,                 Arrythmias:Atrial Fibrillation; Risk Factors:Dyslipidemia.                  Mitral Valve: 36 prosthetic prosthetic annuloplasty ring valve                 is present in the mitral position.   Sonographer:    Ronny Flurry Referring Phys: [place]  1. Left ventricular ejection fraction, by estimation, is 60 to 65%. The left ventricle has normal function. The left ventricle has no regional wall motion abnormalities. Left ventricular diastolic function could not be evaluated.  2. Right ventricular systolic function is normal. The right ventricular size is normal. There is normal pulmonary artery systolic pressure.  3. Left atrial size was mildly dilated.  4. The mitral valve is degenerative. Mild mitral valve regurgitation. Mild mitral stenosis. The mean mitral valve gradient is 7.5 mmHg with average heart rate of 120 bpm. There is a 36 prosthetic prosthetic annuloplasty ring present in the mitral position.  5. The aortic valve is tricuspid. Aortic valve regurgitation is not visualized. No aortic stenosis is present.  6. The inferior vena cava is normal in size with greater than 50% respiratory variability, suggesting right atrial pressure of 3 mmHg. FINDINGS  Left Ventricle: Left ventricular ejection fraction, by estimation, is  60 to 65%. The left ventricle has normal function. The left ventricle has no regional wall motion abnormalities. The left ventricular internal cavity size was normal in size. There is  no left ventricular hypertrophy. Left ventricular diastolic function could not be evaluated due to mitral valve repair. Left ventricular diastolic function could not be evaluated. Right Ventricle: The right ventricular size is normal. No increase in right ventricular wall thickness. Right ventricular systolic function is normal. There is normal pulmonary artery systolic pressure. The tricuspid regurgitant velocity is 1.69 m/s, and  with an assumed right atrial pressure of 8 mmHg, the estimated right ventricular systolic pressure is 96.2 mmHg. Left Atrium: Left atrial size was mildly dilated. Right Atrium: Right atrial size was normal in size. Pericardium: There is no evidence  of pericardial effusion. Mitral Valve: The mitral valve is degenerative in appearance. Mild mitral annular calcification. Mild mitral valve regurgitation. There is a 36 prosthetic prosthetic annuloplasty ring present in the mitral position. Mild mitral valve stenosis. MV peak gradient, 20.2 mmHg. The mean mitral valve gradient is 7.5 mmHg with average heart rate of 120 bpm. Tricuspid Valve: The tricuspid valve is normal in structure. Tricuspid valve regurgitation is mild . No evidence of tricuspid stenosis. Aortic Valve: The aortic valve is tricuspid. Aortic valve regurgitation is not visualized. No aortic stenosis is present. Aortic valve mean gradient measures 1.0 mmHg. Aortic valve peak gradient measures 2.5 mmHg. Aortic valve area, by VTI measures 2.89 cm. Pulmonic Valve: The pulmonic valve was normal in structure. Pulmonic valve regurgitation is trivial. No evidence of pulmonic stenosis. Aorta: The aortic root is normal in size and structure. Venous: The inferior vena cava is normal in size with greater than 50% respiratory variability, suggesting right atrial pressure of 3 mmHg. IAS/Shunts: No atrial level shunt detected by color flow Doppler.  LEFT VENTRICLE PLAX 2D LVIDd:         4.40 cm LVIDs:         3.20 cm LV PW:         0.90 cm LV IVS:        0.80 cm LVOT diam:     2.10 cm LV SV:         32 LV SV Index:   23 LVOT Area:     3.46 cm  RIGHT VENTRICLE          IVC RV Basal diam:  3.00 cm  IVC diam: 1.60 cm TAPSE (M-mode): 1.3 cm LEFT ATRIUM           Index        RIGHT ATRIUM          Index LA diam:      4.40 cm 3.12 cm/m   RA Area:     9.65 cm LA Vol (A4C): 45.9 ml 32.59 ml/m  RA Volume:   21.30 ml 15.12 ml/m  AORTIC VALVE AV Area (Vmax):    2.88 cm AV Area (Vmean):   2.75 cm AV Area (VTI):     2.89 cm AV Vmax:           79.60 cm/s AV Vmean:          54.900 cm/s AV VTI:            0.111 m AV Peak Grad:      2.5 mmHg AV Mean Grad:      1.0 mmHg LVOT Vmax:         66.13 cm/s LVOT Vmean:  43.600 cm/s LVOT VTI:          0.093 m LVOT/AV VTI ratio: 0.83  AORTA Ao Root diam: 2.70 cm Ao Asc diam:  2.50 cm MITRAL VALVE              TRICUSPID VALVE MV Area VTI:  0.56 cm    TR Peak grad:   11.4 mmHg MV Peak grad: 20.2 mmHg   TR Vmax:        169.00 cm/s MV Mean grad: 7.5 mmHg MV Vmax:      2.25 m/s    SHUNTS MV Vmean:     122.5 cm/s  Systemic VTI:  0.09 m                           Systemic Diam: 2.10 cm Skeet Latch MD Electronically signed by Skeet Latch MD Signature Date/Time: 09/06/2022/4:56:26 PM    Final    CT Angio Chest/Abd/Pel for Dissection W and/or Wo Contrast  Result Date: 09/05/2022 CLINICAL DATA:  Left atrial thrombus.  Altered mental status. EXAM: CT ANGIOGRAPHY CHEST, ABDOMEN AND PELVIS TECHNIQUE: Non-contrast CT of the chest was initially obtained. Multidetector CT imaging through the chest, abdomen and pelvis was performed using the standard protocol during bolus administration of intravenous contrast. Multiplanar reconstructed images and MIPs were obtained and reviewed to evaluate the vascular anatomy. RADIATION DOSE REDUCTION: This exam was performed according to the departmental dose-optimization program which includes automated exposure control, adjustment of the mA and/or kV according to patient size and/or use of iterative reconstruction technique. CONTRAST:  117m OMNIPAQUE IOHEXOL 350 MG/ML SOLN COMPARISON:  Earlier chest CT dated 09/05/2022. FINDINGS: Evaluation is limited due to streak artifact caused by patient's arms and dense contrast within the vessels. CTA CHEST FINDINGS Cardiovascular: Borderline cardiomegaly. There is large thrombus within the left atrial appendage as seen on the earlier CT. An intracardiac mass or malignancy is not excluded. Clinical correlation and follow-up recommended. No pericardial effusion. Mild atherosclerotic calcification of the thoracic aorta. No aneurysmal dilatation or dissection. The origins of the great vessels of the aortic arch  appear patent as visualized. No pulmonary artery embolus identified. Mediastinum/Nodes: Borderline enlarged mediastinal lymph nodes in the prevascular space measure up to 11 mm short axis. The esophagus is grossly unremarkable. No mediastinal fluid collection. Lungs/Pleura: Small bilateral pleural effusions with associated bibasilar compressive atelectasis. Infiltrate is not excluded. Diffuse interstitial and interlobular septal prominence consistent with edema. There is no pneumothorax. The central airways are patent. Musculoskeletal: No acute osseous pathology. Review of the MIP images confirms the above findings. CTA ABDOMEN AND PELVIS FINDINGS VASCULAR Aorta: Moderate atherosclerotic calcification of the aorta. No aneurysmal dilatation or dissection. No periaortic fluid collection. Celiac: The celiac trunk and its major branches are patent. SMA: The SMA is patent. Renals: The renal arteries are patent. IMA: The IMA is patent. Inflow: The iliac arteries are patent. No aneurysmal dilatation or dissection. Veins: No obvious venous abnormality within the limitations of this arterial phase study. Review of the MIP images confirms the above findings. NON-VASCULAR No intra-abdominal free air or free fluid. Hepatobiliary: The liver is unremarkable. No biliary dilatation. Multiple layering gallstones. No pericholecystic fluid or evidence of acute cholecystitis by CT. Pancreas: Unremarkable. No pancreatic ductal dilatation or surrounding inflammatory changes. Spleen: A 2 cm hypoenhancing focus in the inferior pole of the spleen is suboptimally evaluated but may represent an infarct. Adrenals/Urinary Tract: The adrenal glands are unremarkable. The kidneys, visualized ureters appear  unremarkable. There is diffuse trabeculated appearance of the bladder wall likely related to chronic bladder outlet obstruction. Stomach/Bowel: There is moderate stool throughout the colon. There is no bowel obstruction or active inflammation.  The appendix is normal. Lymphatic: No adenopathy. Reproductive: The uterus is grossly unremarkable. No adnexal masses. Other: VP shunt tubes in the anterior abdomen with tips in the left lateral abdomen and right hemipelvis. No fluid collection. Musculoskeletal: Osteopenia with degenerative changes of the spine. Old appearing mild compression fracture of superior endplate of L1. Review of the MIP images confirms the above findings. IMPRESSION: 1. No aortic aneurysm or dissection. 2. Large thrombus within the left atrial appendage as seen on the earlier CT. An intracardiac mass or malignancy is not excluded. 3. Small bilateral pleural effusions with associated bibasilar compressive atelectasis. Infiltrate is not excluded. 4. Possible small focal splenic infarct. 5. Cholelithiasis. 6. No bowel obstruction. Normal appendix. Electronically Signed   By: Anner Crete M.D.   On: 09/05/2022 21:37   CT Head Wo Contrast  Result Date: 09/05/2022 CLINICAL DATA:  Altered mental status EXAM: CT HEAD WITHOUT CONTRAST TECHNIQUE: Contiguous axial images were obtained from the base of the skull through the vertex without intravenous contrast. RADIATION DOSE REDUCTION: This exam was performed according to the departmental dose-optimization program which includes automated exposure control, adjustment of the mA and/or kV according to patient size and/or use of iterative reconstruction technique. COMPARISON:  10/16/2021 FINDINGS: Brain: Right frontal approach shunt catheter terminating at the right foramen of [place]. Right parietal approach shunt catheter tip along the roof of the right lateral ventricle. The sizes of the ventricles have markedly decreased since 10/16/2021. There is unchanged gliosis along the right frontal catheter tract. Unchanged postsurgical appearance of the posterior fossa and brainstem. No acute hemorrhage, mass or extra-axial collection. Chronic left posterior parietal encephalomalacia. Vascular: No  hyperdense vessel or unexpected calcification. Skull: Right frontal and parietal burr holes. Remote suboccipital craniectomy. Sinuses/Orbits: No acute finding. Other: None. IMPRESSION: 1. No acute intracranial abnormality. 2. Marked decrease in size of the lateral ventricles compared to 10/16/2021. 3. Unchanged postsurgical appearance of the posterior fossa and brainstem. Electronically Signed   By: Ulyses Jarred M.D.   On: 09/05/2022 21:28   CT Chest W Contrast  Result Date: 09/05/2022 CLINICAL DATA:  Respiratory illness. EXAM: CT CHEST WITH CONTRAST TECHNIQUE: Multidetector CT imaging of the chest was performed during intravenous contrast administration. RADIATION DOSE REDUCTION: This exam was performed according to the departmental dose-optimization program which includes automated exposure control, adjustment of the mA and/or kV according to patient size and/or use of iterative reconstruction technique. CONTRAST:  30m OMNIPAQUE IOHEXOL 350 MG/ML SOLN COMPARISON:  Chest radiograph dated 09/05/2022. CT dated 09/24/2011. FINDINGS: Cardiovascular: Borderline cardiomegaly. No pericardial effusion. There is a large thrombus in the left atrium and left atrial appendage. Underlying mass is not excluded malignancy is not excluded. This can predispose to systemic embolization and infarcts. There is mild atherosclerotic calcification of the thoracic aorta. No aneurysmal dilatation or dissection. The central pulmonary arteries appear patent for the degree of opacification. Mediastinum/Nodes: No hilar adenopathy. Mildly enlarged mediastinal lymph node measure up to 11 mm in short axis in the prevascular space. The esophagus is grossly unremarkable. No mediastinal fluid collection. Lungs/Pleura: Small bilateral pleural effusions with partial compressive atelectasis of the lower lobes. There is diffuse interstitial and interlobular septal prominence consistent with edema. There is no pneumothorax. The central airways are  patent. Upper Abdomen: A 2 cm hypoenhancing focus in the inferior  spleen may represent a focus of infarct. Multiple gallstones. Musculoskeletal: Osteopenia with degenerative changes of the spine. Age indeterminate, likely old, compression fracture of the superior endplate of L1. No acute osseous pathology. IMPRESSION: 1. Large thrombus in the left atrium and left atrial appendage. This can predispose to systemic embolization and infarcts. 2. Probable small focus of infarct in the spleen. If there is clinical concern for additional intra-abdominal infarcts, CT of the abdomen pelvis may provide better evaluation. 3. Small bilateral pleural effusions with partial compressive atelectasis of the lower lobes. 4. Interstitial and interlobular septal prominence consistent with edema. 5. Cholelithiasis. 6.  Aortic Atherosclerosis (ICD10-I70.0). These results were called by telephone at the time of interpretation on 09/05/2022 at 8:41 pm to provider Campbell Stall , who verbally acknowledged these results. Electronically Signed   By: Anner Crete M.D.   On: 09/05/2022 20:42   DG Chest Port 1 View  Result Date: 09/05/2022 CLINICAL DATA:  Questionable sepsis. EXAM: PORTABLE CHEST 1 VIEW COMPARISON:  Chest radiograph dated 04/01/2016. FINDINGS: Mild diffuse interstitial prominence, likely edema. Atypical infiltrate is less likely but not excluded. There is a small left pleural effusion. No pneumothorax. The cardiac silhouette is within normal limits. Cardiac valve repair. Atherosclerotic calcification of the arch. No acute osseous pathology. Right VP shunt. IMPRESSION: 1. Small left pleural effusion. 2. Mild diffuse interstitial prominence, likely edema. Electronically Signed   By: Anner Crete M.D.   On: 09/05/2022 19:11     Patient Profile     KANETRA HO is a 66 y.o. female with history of mitral valve prolapse status post complex mitral valve repair in 2013 (Alfieri edge-to-edge repair with Gore-Tex  cords, 36 mm annuloplasty ring), childhood astrocytoma status post radiation with surgical resection complicated by obstructive hydrocephalus with VP shunt who was admitted on 09/05/2022 for acute hypoxic respiratory failure secondary to RSV and pneumonia.  Cardiology was consulted for new onset A-fib with RVR and left atrial appendage thrombus and splenic infarct.  Echocardiogram shows ejection fraction 60 to 65%, mild left atrial enlargement, status post mitral valve repair with mild mitral stenosis, mild mitral regurgitation.  Assessment & Plan    1 persistent atrial fibrillation-as outlined previously patient has new onset atrial fibrillation and there is thrombus noted in her left atrial appendage associated with splenic infarct.  Echocardiogram shows normal LV function, previous mitral valve repair with mild mitral stenosis/mitral regurgitation.  Can transition to apixaban 5 mg twice daily and discontinue heparin.  Heart rate remains mildly elevated but improved.  Blood pressure will not allow further titration of rate control medications.  Hopefully heart rate will continue to improve as RSV is treated.  If not may need to consider addition of amiodarone.  Plan at this time is rate control and anticoagulation.  If rate difficult to control would need 6 to 8 weeks of anticoagulation followed by TEE to make sure thrombus has resolved and then cardiovert at that point.  2 RSV pneumonia-managed by primary care.  For questions or updates, please contact [place] Please consult [email or web address] for contact info under        Signed, Kirk Ruths, MD  09/07/2022, 12:01 PM

## 2022-09-07 NOTE — Discharge Instructions (Signed)

## 2022-09-07 NOTE — Progress Notes (Signed)
ANTICOAGULATION CONSULT NOTE  Pharmacy Consult for heparin Indication: Atrial thrombus with possible splenic infarct, Afib RVR  Allergies  Allergen Reactions   Compazine Other (See Comments)    "eyes get buggy"   Prochlorperazine    Prochlorperazine Edisylate    Prochlorperazine Maleate     Patient Measurements: Height: '4\' 9"'$  (144.8 cm) Weight: 47.6 kg (104 lb 15 oz) IBW/kg (Calculated) : 38.6 Heparin Dosing Weight: TBW  Vital Signs: Temp: 98.2 F (36.8 C) (12/30 0816) Temp Source: Oral (12/30 0816) BP: 103/73 (12/30 0816) Pulse Rate: 119 (12/30 0816)  Labs: Recent Labs    09/05/22 1815 09/05/22 1825 09/05/22 1826 09/05/22 2040 09/05/22 2247 09/06/22 0125 09/06/22 0535 09/06/22 1222 09/07/22 0052  HGB 10.8* 11.2* 11.6*  --   --  9.8*  --   --  9.7*  HCT 36.6 33.0* 34.0*  --   --  31.2*  --   --  30.3*  PLT 207  --   --   --   --  182  --   --  187  APTT 26  --   --   --   --   --   --   --   --   LABPROT 13.4  --   --   --   --   --   --   --   --   INR 1.0  --   --   --   --   --   --   --   --   HEPARINUNFRC  --   --   --   --   --   --  0.42 0.43 0.35  CREATININE 0.65 0.60  --   --   --  0.49  --   --  0.55  TROPONINIHS  --   --   --  <2 5  --   --   --   --      Estimated Creatinine Clearance: 47.3 mL/min (by C-G formula based on SCr of 0.55 mg/dL).   Assessment: 81 YOF presenting with SOB, CT shows large atrial thrombus and possible splenic infarct.  Also has Afib RVR.  Pharmacy consulted to dose IV heparin.  Heparin level therapeutic at 0.35. CBC stable. No issues with heparin infusion or s/sx/ bleeding per RN.   Goal of Therapy:  Heparin level 0.3-0.7 units/ml Monitor platelets by anticoagulation protocol: Yes   Plan:  Continue heparin gtt at 700 units/hr  Daily heparin level, CBC Monitor s/sx bleeding   Eliseo Gum, PharmD PGY1 Pharmacy Resident   09/07/2022  9:19 AM

## 2022-09-07 NOTE — Progress Notes (Signed)
PROGRESS NOTE  Michaela [name]  DOB: 1956-03-11  PCP: Javier Glazier, MD EPP:[id number]  DOA: 09/05/2022  LOS: 2 days  Hospital Day: 3  Brief narrative: Michaela [name] is a 66 y.o. female with PMH significant for mitral valve prolapse and severe MR s/p repair and annuloplasty, history of astrocytoma s/p radiation, and surgical resection, complicated with obstructive hydrocephalus, now with VP shunt.  She has been living at [facility] long-term care for last 3 years, wheelchair bound at baseline. Patient apparently was having cough, shortness of breath a few days.  Labs showed elevated WBC count and she was started on Bactrim but her symptoms did not improve and hence she was sent to the ED on 12/28.  In the ED, patient was noted to be in A-fib with RVR with heart rate up to 150s, O2 sat was low Lactic acid level was elevated to 5.8 Respiratory virus panel was positive for RSV CTA chest did not show any evidence of pulm embolism.  But it showed left atrial enlargement with left atrial appendage clot and splenic infarct.  It also showed possible bibasilar infiltrates Patient was admitted to ICU Cardiology was consulted as well 12/30, transferred out to [facility]  Subjective: Patient was seen and examined this morning.  Pleasant elderly Caucasian female.  Propped up in bed.  Alert, awake but able to have minimal conversation only. Her sister was at bedside who helped with most of the history  Assessment and plan: Sepsis secondary to RSV pneumonia  Acute hypoxic respiratory failure RSV pneumonia Lactic acidosis Presented with worsening cough, shortness of breath, lactate level on admission was elevated to 5.8. CT chest suggested possible bibasilar infiltrates. Currently on supportive care for RSV. Continue droplet precautions. Currently on antibiotics. Lactic acid level improved.  No fever.  Not leukocytosis. Recent Labs  Lab 09/05/22 1815 09/05/22 2040 09/06/22 0125  09/06/22 0535 09/07/22 0052  WBC 9.3  --  7.9  --  6.1  LATICACIDVEN 1.7 5.8* 1.0 1.2  --     New onset A-fib with RVR  History of mitral prolapse, severe MR s/p repair and annuloplasty Heart rate was elevated to 150s in the ED. Cardiology was consulted.  Currently on digoxin with gradual improvement in heart rate.   Currently on IV heparin drip.  Plan to start on oral anticoagulation prior to discharge.  Large left atrial thrombus Acute splenic infarction CT angio chest obtained on admission showed a large left atrial thrombus probably because of splenic infarct.  Started on heparin drip. Cardiology following. Echo showed normal EF,, previous mitral valve repair with mild mitral stenosis and mitral regurgitation.    Astrocytoma s/p resection, obstructive hydrocephalus now with VP shunt supportive care, mental status improving per sister   Goals of care   Code Status: DNR    Mobility: Bedbound for last few years Scheduled Meds:  apixaban  5 mg Oral BID   atorvastatin  20 mg Oral Daily   Chlorhexidine Gluconate Cloth  6 each Topical Q0600   digoxin  0.125 mg Oral Daily   escitalopram  20 mg Oral Daily   guaiFENesin  600 mg Oral BID   melatonin  3 mg Oral QHS   metoprolol tartrate  25 mg Oral Q6H   mirabegron ER  25 mg Oral Daily   pantoprazole  40 mg Oral Daily   polyvinyl alcohol  1 drop Both Eyes QID   saccharomyces boulardii  250 mg Oral BID    PRN meds: clonazePAM, docusate  sodium, mouth rinse, polyethylene glycol   Infusions:    Skin assessment:     Nutritional status:  Body mass index is 22.71 kg/m.          Diet:  Diet Order             Diet regular Room service appropriate? No; Fluid consistency: Thin  Diet effective now                   DVT prophylaxis:  SCDs Start: 09/05/22 2333 apixaban (ELIQUIS) tablet 5 mg   Antimicrobials: None Fluid: None Consultants: Cardiology Family Communication: Sister at bedside  Status is:  Inpatient  Continue in-hospital care because: May need TEE Level of care: Telemetry Cardiac   Dispo: The patient is from: Long-term nursing at [facility]              Anticipated d/c is to: Back to [place] in 1 to 2 days              Patient currently is not medically stable to d/c.   Difficult to place patient No    Antimicrobials: Anti-infectives (From admission, onward)    Start     Dose/Rate Route Frequency Ordered Stop   09/06/22 2100  vancomycin (VANCOREADY) IVPB 500 mg/100 mL  Status:  Discontinued        500 mg 100 mL/hr over 60 Minutes Intravenous Every 24 hours 09/05/22 1847 09/05/22 2355   09/06/22 1000  ceFEPIme (MAXIPIME) 2 g in sodium chloride 0.9 % 100 mL IVPB  Status:  Discontinued        2 g 200 mL/hr over 30 Minutes Intravenous Every 12 hours 09/05/22 1847 09/05/22 2355   09/05/22 1830  ceFEPIme (MAXIPIME) 2 g in sodium chloride 0.9 % 100 mL IVPB        2 g 200 mL/hr over 30 Minutes Intravenous  Once 09/05/22 1816 09/05/22 2000   09/05/22 1830  metroNIDAZOLE (FLAGYL) IVPB 500 mg        500 mg 100 mL/hr over 60 Minutes Intravenous  Once 09/05/22 1816 09/05/22 2103   09/05/22 1830  vancomycin (VANCOCIN) IVPB 1000 mg/200 mL premix        1,000 mg 200 mL/hr over 60 Minutes Intravenous  Once 09/05/22 1816 09/05/22 2103       Objective: Vitals:   09/07/22 0421 09/07/22 0816  BP: (!) 86/57 103/73  Pulse: 85 (!) 119  Resp: 12 12  Temp: 98.4 F (36.9 C) 98.2 F (36.8 C)  SpO2: 98% 100%    Intake/Output Summary (Last 24 hours) at 09/07/2022 1358 Last data filed at 09/07/2022 1300 Gross per 24 hour  Intake 674.99 ml  Output 2000 ml  Net -1325.01 ml   Filed Weights   09/06/22 0700 09/07/22 0018  Weight: 48.1 kg 47.6 kg   Weight change: -0.5 kg Body mass index is 22.71 kg/m.   Physical Exam: General exam: Elderly Caucasian female.  Not in physical distress Skin: No rashes, lesions or ulcers. HEENT: Atraumatic, normocephalic, no obvious  bleeding Lungs: Clear to auscultation bilaterally CVS: Mildly tachycardic A-fib, no murmur GI/Abd soft, nontender, nondistended, bowel sound present CNS: Alert, awake, slow to respond. Psychiatry: Sad affect Extremities: No pedal edema, no calf tenderness  Data Review: I have personally reviewed the laboratory data and studies available.  F/u labs ordered Unresulted Labs (From admission, onward)     Start     Ordered   09/06/22 0500  CBC  Daily,  R     See Hyperspace for full Linked Orders Report.   09/05/22 2104            Total time spent in review of labs and imaging, patient evaluation, formulation of plan, documentation and communication with family -42 minutes  Signed, Terrilee Croak, MD [facility] 09/07/2022

## 2022-09-08 DIAGNOSIS — I4891 Unspecified atrial fibrillation: Secondary | ICD-10-CM | POA: Diagnosis not present

## 2022-09-08 LAB — CBC
HCT: 35.1 % — ABNORMAL LOW (ref 36.0–46.0)
Hemoglobin: 11 g/dL — ABNORMAL LOW (ref 12.0–15.0)
MCH: 29 pg (ref 26.0–34.0)
MCHC: 31.3 g/dL (ref 30.0–36.0)
MCV: 92.6 fL (ref 80.0–100.0)
Platelets: 230 10*3/uL (ref 150–400)
RBC: 3.79 MIL/uL — ABNORMAL LOW (ref 3.87–5.11)
RDW: 13.3 % (ref 11.5–15.5)
WBC: 8.7 10*3/uL (ref 4.0–10.5)
nRBC: 0 % (ref 0.0–0.2)

## 2022-09-08 MED ORDER — SODIUM CHLORIDE 0.9 % IV SOLN
1.5000 g | Freq: Three times a day (TID) | INTRAVENOUS | Status: DC
  Administered 2022-09-08 – 2022-09-12 (×11): 1.5 g via INTRAVENOUS
  Filled 2022-09-08 (×15): qty 4

## 2022-09-08 MED ORDER — ACETAMINOPHEN 325 MG PO TABS
650.0000 mg | ORAL_TABLET | Freq: Four times a day (QID) | ORAL | Status: DC | PRN
  Administered 2022-09-08 – 2022-09-12 (×3): 650 mg via ORAL
  Filled 2022-09-08 (×3): qty 2

## 2022-09-08 MED ORDER — METOPROLOL TARTRATE 25 MG PO TABS
37.5000 mg | ORAL_TABLET | Freq: Four times a day (QID) | ORAL | Status: DC
  Administered 2022-09-08 – 2022-09-09 (×5): 37.5 mg via ORAL
  Filled 2022-09-08 (×6): qty 1

## 2022-09-08 NOTE — Progress Notes (Signed)
Rounding Note    Patient Name: Michaela [name] Date of Encounter: 09/08/2022  [place] Cardiologist: Dr Audie Box  Subjective   Pt denies CP or dyspnea  Inpatient Medications    Scheduled Meds:  apixaban  5 mg Oral BID   atorvastatin  20 mg Oral Daily   Chlorhexidine Gluconate Cloth  6 each Topical Q0600   digoxin  0.125 mg Oral Daily   escitalopram  20 mg Oral Daily   guaiFENesin  600 mg Oral BID   melatonin  3 mg Oral QHS   metoprolol tartrate  25 mg Oral Q6H   mirabegron ER  25 mg Oral Daily   pantoprazole  40 mg Oral Daily   polyvinyl alcohol  1 drop Both Eyes QID   saccharomyces boulardii  250 mg Oral BID   Continuous Infusions:   PRN Meds: clonazePAM, docusate sodium, mouth rinse, polyethylene glycol   Vital Signs    Vitals:   09/08/22 0542 09/08/22 0840 09/08/22 0845 09/08/22 0910  BP: (!) 141/111 103/61  103/61  Pulse: (!) 128 (!) 128 (!) 110 (!) 128  Resp: (!) 23 (!) 34 (!) 22 20  Temp: 100.3 F (37.9 C) 98.9 F (37.2 C) 98.9 F (37.2 C)   TempSrc: Oral Oral Oral   SpO2: 98% 96% 96%   Weight: 45.5 kg     Height:        Intake/Output Summary (Last 24 hours) at 09/08/2022 1012 Last data filed at 09/08/2022 0900 Gross per 24 hour  Intake 480 ml  Output 400 ml  Net 80 ml       09/08/2022    5:42 AM 09/07/2022   12:18 AM 09/06/2022    7:00 AM  Last 3 Weights  Weight (lbs) 100 lb 5 oz 104 lb 15 oz 106 lb 0.7 oz  Weight (kg) 45.5 kg 47.6 kg 48.1 kg      Telemetry    Atrial fibrillation rate controlled - Personally Reviewed   Physical Exam   GEN: NAD Neck: Supple Cardiac: irregular Respiratory: CTA GI: Soft, NT/ND MS: No edema; deformities noted Neuro:  Grossly intact  Labs    High Sensitivity Troponin:   Recent Labs  Lab 09/05/22 2040 09/05/22 2247  TROPONINIHS <2 5      Chemistry Recent Labs  Lab 09/05/22 1815 09/05/22 1825 09/05/22 1826 09/06/22 0125 09/07/22 0052  NA 136 136 136 136 137  K  3.7 3.6 3.7 3.6 3.6  CL 102 101  --  105 102  CO2 24  --   --  23 26  GLUCOSE 89 84  --  93 82  BUN 19 19  --  15 14  CREATININE 0.65 0.60  --  0.49 0.55  CALCIUM 8.0*  --   --  7.6* 8.1*  MG  --   --   --  1.7 2.2  PROT 5.5*  --   --   --   --   ALBUMIN 2.7*  --   --   --   --   AST 31  --   --   --   --   ALT 28  --   --   --   --   ALKPHOS 64  --   --   --   --   BILITOT 0.4  --   --   --   --   GFRNONAA >60  --   --  >60 >60  ANIONGAP 10  --   --  8 9      Hematology Recent Labs  Lab 09/06/22 0125 09/07/22 0052 09/08/22 0032  WBC 7.9 6.1 8.7  RBC 3.29* 3.30* 3.79*  HGB 9.8* 9.7* 11.0*  HCT 31.2* 30.3* 35.1*  MCV 94.8 91.8 92.6  MCH 29.8 29.4 29.0  MCHC 31.4 32.0 31.3  RDW 13.5 13.4 13.3  PLT 182 187 230    BNP Recent Labs  Lab 09/05/22 1815  BNP 706.4*      Radiology    ECHOCARDIOGRAM COMPLETE  Result Date: 09/06/2022    ECHOCARDIOGRAM REPORT   Patient Name:   Michaela [name] Date of Exam: 09/06/2022 Medical Rec #:  [id number]           Height:       59.0 in Accession #:    [id number]          Weight:       106.0 lb Date of Birth:  03/02/1956           BSA:          1.408 m Patient Age:    66 years            BP:           105/79 mmHg Patient Gender: F                   HR:           137 bpm. Exam Location:  Inpatient Procedure: 2D Echo, Cardiac Doppler and Color Doppler Indications:    Mitral Valve Disorder I05.9  History:        Patient has prior history of Echocardiogram examinations. CHF,                 Arrythmias:Atrial Fibrillation; Risk Factors:Dyslipidemia.                  Mitral Valve: 36 prosthetic prosthetic annuloplasty ring valve                 is present in the mitral position.  Sonographer:    Ronny Flurry Referring Phys: [place]  1. Left ventricular ejection fraction, by estimation, is 60 to 65%. The left ventricle has normal function. The left ventricle has no regional wall motion abnormalities. Left ventricular  diastolic function could not be evaluated.  2. Right ventricular systolic function is normal. The right ventricular size is normal. There is normal pulmonary artery systolic pressure.  3. Left atrial size was mildly dilated.  4. The mitral valve is degenerative. Mild mitral valve regurgitation. Mild mitral stenosis. The mean mitral valve gradient is 7.5 mmHg with average heart rate of 120 bpm. There is a 36 prosthetic prosthetic annuloplasty ring present in the mitral position.  5. The aortic valve is tricuspid. Aortic valve regurgitation is not visualized. No aortic stenosis is present.  6. The inferior vena cava is normal in size with greater than 50% respiratory variability, suggesting right atrial pressure of 3 mmHg. FINDINGS  Left Ventricle: Left ventricular ejection fraction, by estimation, is 60 to 65%. The left ventricle has normal function. The left ventricle has no regional wall motion abnormalities. The left ventricular internal cavity size was normal in size. There is  no left ventricular hypertrophy. Left ventricular diastolic function could not be evaluated due to mitral valve repair. Left ventricular diastolic function could not be evaluated. Right Ventricle: The right ventricular size is normal. No increase in right ventricular wall thickness. Right ventricular systolic  function is normal. There is normal pulmonary artery systolic pressure. The tricuspid regurgitant velocity is 1.69 m/s, and  with an assumed right atrial pressure of 8 mmHg, the estimated right ventricular systolic pressure is 91.6 mmHg. Left Atrium: Left atrial size was mildly dilated. Right Atrium: Right atrial size was normal in size. Pericardium: There is no evidence of pericardial effusion. Mitral Valve: The mitral valve is degenerative in appearance. Mild mitral annular calcification. Mild mitral valve regurgitation. There is a 36 prosthetic prosthetic annuloplasty ring present in the mitral position. Mild mitral valve stenosis.  MV peak gradient, 20.2 mmHg. The mean mitral valve gradient is 7.5 mmHg with average heart rate of 120 bpm. Tricuspid Valve: The tricuspid valve is normal in structure. Tricuspid valve regurgitation is mild . No evidence of tricuspid stenosis. Aortic Valve: The aortic valve is tricuspid. Aortic valve regurgitation is not visualized. No aortic stenosis is present. Aortic valve mean gradient measures 1.0 mmHg. Aortic valve peak gradient measures 2.5 mmHg. Aortic valve area, by VTI measures 2.89 cm. Pulmonic Valve: The pulmonic valve was normal in structure. Pulmonic valve regurgitation is trivial. No evidence of pulmonic stenosis. Aorta: The aortic root is normal in size and structure. Venous: The inferior vena cava is normal in size with greater than 50% respiratory variability, suggesting right atrial pressure of 3 mmHg. IAS/Shunts: No atrial level shunt detected by color flow Doppler.  LEFT VENTRICLE PLAX 2D LVIDd:         4.40 cm LVIDs:         3.20 cm LV PW:         0.90 cm LV IVS:        0.80 cm LVOT diam:     2.10 cm LV SV:         32 LV SV Index:   23 LVOT Area:     3.46 cm  RIGHT VENTRICLE          IVC RV Basal diam:  3.00 cm  IVC diam: 1.60 cm TAPSE (M-mode): 1.3 cm LEFT ATRIUM           Index        RIGHT ATRIUM          Index LA diam:      4.40 cm 3.12 cm/m   RA Area:     9.65 cm LA Vol (A4C): 45.9 ml 32.59 ml/m  RA Volume:   21.30 ml 15.12 ml/m  AORTIC VALVE AV Area (Vmax):    2.88 cm AV Area (Vmean):   2.75 cm AV Area (VTI):     2.89 cm AV Vmax:           79.60 cm/s AV Vmean:          54.900 cm/s AV VTI:            0.111 m AV Peak Grad:      2.5 mmHg AV Mean Grad:      1.0 mmHg LVOT Vmax:         66.13 cm/s LVOT Vmean:        43.600 cm/s LVOT VTI:          0.093 m LVOT/AV VTI ratio: 0.83  AORTA Ao Root diam: 2.70 cm Ao Asc diam:  2.50 cm MITRAL VALVE              TRICUSPID VALVE MV Area VTI:  0.56 cm    TR Peak grad:   11.4 mmHg MV Peak grad: 20.2 mmHg  TR Vmax:        169.00 cm/s MV Mean  grad: 7.5 mmHg MV Vmax:      2.25 m/s    SHUNTS MV Vmean:     122.5 cm/s  Systemic VTI:  0.09 m                           Systemic Diam: 2.10 cm Skeet Latch MD Electronically signed by Skeet Latch MD Signature Date/Time: 09/06/2022/4:56:26 PM    Final      Patient Profile     Michaela [name] is a 66 y.o. female with history of mitral valve prolapse status post complex mitral valve repair in 2013 (Alfieri edge-to-edge repair with Gore-Tex cords, 36 mm annuloplasty ring), childhood astrocytoma status post radiation with surgical resection complicated by obstructive hydrocephalus with VP shunt who was admitted on 09/05/2022 for acute hypoxic respiratory failure secondary to RSV and pneumonia.  Cardiology was consulted for new onset A-fib with RVR and left atrial appendage thrombus and splenic infarct.  Echocardiogram shows ejection fraction 60 to 65%, mild left atrial enlargement, status post mitral valve repair with mild mitral stenosis, mild mitral regurgitation.  Assessment & Plan    1 persistent atrial fibrillation-patient is noted to have new onset atrial fibrillation and there is thrombus in her left atrial appendage with associated splenic infarct.  Echocardiogram shows normal LV function with previous mitral valve repair and mild mitral regurgitation/mild mitral stenosis.  Continue apixaban 5 mg twice daily.  Heart rate remains mildly elevated.  Will increase metoprolol to 37.5 mg every 6 hours to see if blood pressure allows.  Will change to twice daily dosing or Toprol once final dose is clear.  Continue digoxin. Hopefully heart rate will continue to improve as RSV is treated.  If not may need to consider addition of amiodarone.  Plan at this point is rate control and anticoagulation but if rate is difficult to control would need 6 to 8 weeks of apixaban followed by TEE and if there is no thrombus proceed with cardioversion at that point.    2 RSV pneumonia-managed by primary  care.  For questions or updates, please contact [place] Please consult [email or web address] for contact info under        Signed, Kirk Ruths, MD  09/08/2022, 10:12 AM

## 2022-09-08 NOTE — Evaluation (Signed)
Clinical/Bedside Swallow Evaluation Patient Details  Name: Michaela [name] MRN: [id number] Date of Birth: June 23, 1956  Today's Date: 09/08/2022 Time: SLP Start Time (ACUTE ONLY): 0935 SLP Stop Time (ACUTE ONLY): 1005 SLP Time Calculation (min) (ACUTE ONLY): 30 min  Past Medical History:  Past Medical History:  Diagnosis Date   Astrocytoma brain tumor ([place]) 1971   astrocytoma treated with radiation   C. difficile colitis 05/2425   complicated by sepsis/ARDS and prolonged hosp   Depression    Hyperlipidemia    Mitral regurgitation 10/2011   severe, s/p min invasive repair and annuloplasty   Mitral valve prolapse    Neuromuscular disorder (HCC)    numbness in hands and feet   Osteoarthritis    Osteoporosis    DEXA @ [facility] 11/2012: -3.5, prior tx Reclast, last in 2011, rec change to Prolia   Overactive bladder    Pleural effusion, left 11/11/2011   Vitamin D deficiency    Past Surgical History:  Past Surgical History:  Procedure Laterality Date   CARDIAC CATHETERIZATION     Jan 2013   KNEE ARTHROSCOPY Right    MITRAL VALVE REPAIR  10/15/2011   Procedure: MINIMALLY INVASIVE MITRAL VALVE REPAIR (MVR);  Surgeon: Rexene Alberts, MD;  Location: [place];  Service: Open Heart Surgery;  Laterality: Right;   TRANSESOPHAGEAL ECHOCARDIOGRAM  7/12   US ECHOCARDIOGRAPHY  6/12   VENTRICULOPERITONEAL SHUNT  1971   HPI:  66 yo female adm to [facility] with cough and increased dyspnea over night- found to have RSV and a clot.  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  During imaging, she was found to have RSV and Left atrial appendage clot - was transferred out of ICU - she is on nasal cannula oxygen.  Pt is now on anticoagulant per cardiology for her clot.  Swallow eval ordered.  Pt admits to issues with swallowing, sensing food retention in esophagus and also high in the throat  *based SLP pointing to isolate area of retention. She denies having lost weight.  Is unable to feed herself and resides at [facility].  Brain imaging has shown atrophy of brainstem and cerebellar area.    Assessment / Plan / Recommendation  Clinical Impression  Patient presents with clinical indications of dysphagia likely due to her h/o cerebellar astrocytoma with resection/treatment in her childhood and brain atrophy.  She demonstates delayed responses and discoordination wtih speech and swallowing.  Positioning for this pt is difficult given her quadriparesis = SLP placed her in reverse trendelenburg to optimize position.  Voice is low amplitude as well as cough being very weak.  Pt provided with po trials included graham cracker moistned with applesauce and juice, nectar thick water and juice and thin water.  Swallow clinically judged to be discoordinated and delayed with lack of oral seal most notably with liquids.  Pt has congested breathing at baseline that is worsened with effort/po - and demonstrated delayed strong cough x1.  She does endorse h/o dysphagia to foods - sensing lodging in pharynx *upper* and esophagus.  RN reports pt overtly coughing with medications and thin consistencies and NT reports pt having issues with potatoes which SLP suspects is due to discoordination and lack of labial seal allowing premature spillage into pharynx.  Also positioning is suboptimal, thus recommend modify to dys3/nectar and allo thin water between meals for maximal safety.  Pt  reports her swallow is at baseline - thus anticipate ability to advance diet with medical improvement.  Will follow up for indication of instrumental swallow evaluation.  Educated pt to findings/recommendations using teach back and posted swallow precaution sign. Informed RN and NT of recommendations. Thanks for this consult. SLP Visit Diagnosis: Dysphagia, oral phase (R13.11);Dysphagia, unspecified (R13.10)    Aspiration Risk  Mild  aspiration risk    Diet Recommendation Dysphagia 3 (Mech soft);Nectar-thick liquid;Free water protocol after oral care   Liquid Administration via: Straw;Cup;Spoon Medication Administration: Whole meds with puree Supervision: Full supervision/cueing for compensatory strategies Compensations: Slow rate;Small sips/bites Postural Changes: Remain upright for at least 30 minutes after po intake;Seated upright at 90 degrees (reverse trendelenburg)    Other  Recommendations Oral Care Recommendations: Oral care BID    Recommendations for follow up therapy are one component of a multi-disciplinary discharge planning process, led by the attending physician.  Recommendations may be updated based on patient status, additional functional criteria and insurance authorization.  Follow up Recommendations Follow physician's recommendations for discharge plan and follow up therapies      Assistance Recommended at Discharge    Functional Status Assessment Patient has had a recent decline in their functional status and demonstrates the ability to make significant improvements in function in a reasonable and predictable amount of time.  Frequency and Duration min 1 x/week  1 week       Prognosis Prognosis for Safe Diet Advancement: Good      Swallow Study   General HPI: 66 yo female adm to [facility] with cough and increased dyspnea over night- found to have RSV and a clot.  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  During imaging, she was found to have RSV and Left atrial appendage clot - was transferred out of ICU - she is on nasal cannula oxygen.  Pt is now on anticoagulant per cardiology for her clot.  Swallow eval ordered.  Pt admits to issues with swallowing, sensing food retention in esophagus and also high in the throat *based SLP pointing to isolate area of retention. She denies  having lost weight.  Is unable to feed herself and resides at [facility].  Brain imaging has shown atrophy of brainstem and cerebellar area. Type of Study: Bedside Swallow Evaluation Previous Swallow Assessment: none in the chart that SLP could locate Diet Prior to this Study: Thin liquids;Regular Temperature Spikes Noted: No Respiratory Status: Nasal cannula History of Recent Intubation: No Behavior/Cognition: Alert;Cooperative;Pleasant mood;Other (Comment) (delayed responses) Oral Cavity Assessment: Within Functional Limits Oral Care Completed by SLP: No Oral Cavity - Dentition: Adequate natural dentition Self-Feeding Abilities: Total assist Patient Positioning: Upright in bed;Other (comment) (reverse trendelenberg position) Baseline Vocal Quality: Low vocal intensity Volitional Cough: Cognitively unable to elicit;Other (Comment) (very weak when elicited, reflexive cough is stronger) Volitional Swallow: Able to elicit    Oral/Motor/Sensory Function Overall Oral Motor/Sensory Function: Other (comment) (discoordination resulting in impaired ROM and efficient oral manipulation)   Ice Chips Ice chips: Not tested   Thin Liquid Thin Liquid: Impaired Presentation: Straw;Spoon Oral Phase Impairments: Reduced labial seal Oral Phase Functional Implications: Prolonged oral transit Pharyngeal  Phase Impairments: Suspected delayed Swallow;Multiple swallows    Nectar Thick Nectar Thick Liquid: Impaired Presentation: Straw;Spoon Oral Phase Impairments: Reduced labial seal;Reduced lingual movement/coordination Oral phase functional implications: Prolonged oral transit Pharyngeal Phase Impairments: Suspected delayed Swallow;Multiple swallows  Honey Thick Honey Thick Liquid: Not tested   Puree Puree: Impaired Presentation: Spoon Oral Phase Impairments: Reduced labial seal;Reduced lingual movement/coordination Oral Phase Functional Implications: Prolonged oral transit Pharyngeal Phase Impairments:  Suspected delayed Swallow   Solid     Solid: Impaired Presentation: Spoon Oral Phase Impairments: Reduced labial seal;Reduced lingual movement/coordination;Impaired mastication Oral Phase Functional Implications: Prolonged oral transit Pharyngeal Phase Impairments: Suspected delayed Swallow      [name] 09/08/2022,10:44 AM [name], MS [facility] SLP Acute Rehab Services Office [phone] Pager [phone]

## 2022-09-08 NOTE — Plan of Care (Signed)

## 2022-09-08 NOTE — Progress Notes (Signed)
Pharmacy Antibiotic Note  Michaela [name] is a 66 y.o. female admitted on 09/05/2022 presenting with worsening SOB, concern for sepsis.  She was found to be RSV+ and antibiotics were stopped 12/28. She had a fever of 102.3 tonight. Pharmacy has been consulted for Unasyn dosing for possible UTI. She has a hx UTIs with neurogenic bladder.  UCx (collected after abx): 400 colonies aerococcus species + 100 colonies Enterococcus faecalis  Plan: Unasyn 1.5 g IV q8h Monitor renal function, clinical progress, cultures/sensitivities F/U LOT and de-escalate as able  Height: '4\' 9"'$  (144.8 cm) Weight: 45.5 kg (100 lb 5 oz) IBW/kg (Calculated) : 38.6  Temp (24hrs), Avg:100.1 F (37.8 C), Min:98.9 F (37.2 C), Max:102.3 F (39.1 C)  Recent Labs  Lab 09/05/22 1815 09/05/22 1825 09/05/22 2040 09/06/22 0125 09/06/22 0535 09/07/22 0052 09/08/22 0032  WBC 9.3  --   --  7.9  --  6.1 8.7  CREATININE 0.65 0.60  --  0.49  --  0.55  --   LATICACIDVEN 1.7  --  5.8* 1.0 1.2  --   --      Estimated Creatinine Clearance: 43.3 mL/min (by C-G formula based on SCr of 0.55 mg/dL).    Allergies  Allergen Reactions   Compazine Other (See Comments)    "eyes get buggy"   Prochlorperazine    Prochlorperazine Edisylate    Prochlorperazine Maleate     Thank you for involving pharmacy in this patient's care.  [name], PharmD, BCPS Clinical Pharmacist Clinical phone for 09/08/2022 is [phone] 09/08/2022 9:13 PM

## 2022-09-08 NOTE — Progress Notes (Signed)
PROGRESS NOTE  [name]  DOB: 11/11/1955  PCP: Javier Glazier, MD WER:[id number]  DOA: 09/05/2022  LOS: 3 days  Hospital Day: 4  Brief narrative: Michaela [name] is a 66 y.o. female with PMH significant for mitral valve prolapse and severe MR s/p repair and annuloplasty, history of astrocytoma s/p radiation, and surgical resection, complicated with obstructive hydrocephalus, now with VP shunt.  She has been living at [facility] long-term care for last 3 years, wheelchair bound at baseline. Patient apparently was having cough, shortness of breath a few days.  Labs showed elevated WBC count and she was started on Bactrim but her symptoms did not improve and hence she was sent to the ED on 12/28.  In the ED, patient was noted to be in A-fib with RVR with heart rate up to 150s, O2 sat was low Lactic acid level was elevated to 5.8 Respiratory virus panel was positive for RSV CTA chest did not show any evidence of pulm embolism.  But it showed left atrial enlargement with left atrial appendage clot and splenic infarct.  It also showed possible bibasilar infiltrates Patient was admitted to ICU Cardiology was consulted as well 12/30, transferred out to [facility]  Subjective: Patient was seen and examined this morning.   Propped up in bed.  Not in distress.  Running tachycardic to 120s this morning.  Family not at bedside.  Patient able to verbalize but unable to have a conversation which is her baseline.    Assessment and plan: Sepsis secondary to RSV pneumonia  Acute hypoxic respiratory failure RSV pneumonia Lactic acidosis Presented with worsening cough, shortness of breath, lactate level on admission was elevated to 5.8. CT chest suggested possible bibasilar infiltrates. Currently on supportive care for RSV. Continue droplet precautions. Currently on antibiotics. Lactic acid level improved.  No fever.  Not leukocytosis. Recent Labs  Lab 09/05/22 1815 09/05/22 2040  09/06/22 0125 09/06/22 0535 09/07/22 0052 09/08/22 0032  WBC 9.3  --  7.9  --  6.1 8.7  LATICACIDVEN 1.7 5.8* 1.0 1.2  --   --      New onset A-fib with RVR  History of mitral prolapse, severe MR s/p repair and annuloplasty Heart rate was elevated to 150s in the ED. Cardiology was consulted.  Currently on metoprolol 25 mg every 6 hours, digoxin 0.125 mg daily. Tachycardic this morning.  Defer to cardiology for adjustment Currently on IV heparin drip.  Plan to start on oral anticoagulation prior to discharge.  Large left atrial thrombus Acute splenic infarction CT angio chest obtained on admission showed a large left atrial thrombus probably because of splenic infarct.  Started on heparin drip. Cardiology following. Echo showed normal EF,, previous mitral valve repair with mild mitral stenosis and mitral regurgitation. Defer to cardiology for the indication and timing of TEE    Astrocytoma s/p resection, obstructive hydrocephalus now with VP shunt supportive care, mental status improving per sister   Chronic immobility Bedbound at nursing home for last few years  Goals of care   Code Status: DNR   Scheduled Meds:  apixaban  5 mg Oral BID   atorvastatin  20 mg Oral Daily   Chlorhexidine Gluconate Cloth  6 each Topical Q0600   digoxin  0.125 mg Oral Daily   escitalopram  20 mg Oral Daily   guaiFENesin  600 mg Oral BID   melatonin  3 mg Oral QHS   metoprolol tartrate  25 mg Oral Q6H   mirabegron ER  25 mg Oral Daily   pantoprazole  40 mg Oral Daily   polyvinyl alcohol  1 drop Both Eyes QID   saccharomyces boulardii  250 mg Oral BID    PRN meds: clonazePAM, docusate sodium, mouth rinse, polyethylene glycol   Infusions:    Skin assessment:     Nutritional status:  Body mass index is 21.71 kg/m.          Diet:  Diet Order             Diet regular Room service appropriate? No; Fluid consistency: Thin  Diet effective now                   DVT  prophylaxis:  SCDs Start: 09/05/22 2333 apixaban (ELIQUIS) tablet 5 mg   Antimicrobials: None Fluid: None Consultants: Cardiology Family Communication: Sister not at bedside today  Status is: Inpatient  Continue in-hospital care because: Cardiology adjusting medication for rate control.  May need TEE Level of care: Telemetry Cardiac   Dispo: The patient is from: Long-term nursing at [facility]              Anticipated d/c is to: Back to [place] burn in 1 to 2 days after clearance by cardiology.              Patient currently is not medically stable to d/c.   Difficult to place patient No    Antimicrobials: Anti-infectives (From admission, onward)    Start     Dose/Rate Route Frequency Ordered Stop   09/06/22 2100  vancomycin (VANCOREADY) IVPB 500 mg/100 mL  Status:  Discontinued        500 mg 100 mL/hr over 60 Minutes Intravenous Every 24 hours 09/05/22 1847 09/05/22 2355   09/06/22 1000  ceFEPIme (MAXIPIME) 2 g in sodium chloride 0.9 % 100 mL IVPB  Status:  Discontinued        2 g 200 mL/hr over 30 Minutes Intravenous Every 12 hours 09/05/22 1847 09/05/22 2355   09/05/22 1830  ceFEPIme (MAXIPIME) 2 g in sodium chloride 0.9 % 100 mL IVPB        2 g 200 mL/hr over 30 Minutes Intravenous  Once 09/05/22 1816 09/05/22 2000   09/05/22 1830  metroNIDAZOLE (FLAGYL) IVPB 500 mg        500 mg 100 mL/hr over 60 Minutes Intravenous  Once 09/05/22 1816 09/05/22 2103   09/05/22 1830  vancomycin (VANCOCIN) IVPB 1000 mg/200 mL premix        1,000 mg 200 mL/hr over 60 Minutes Intravenous  Once 09/05/22 1816 09/05/22 2103       Objective: Vitals:   09/08/22 0845 09/08/22 0910  BP:  103/61  Pulse: (!) 110 (!) 128  Resp: (!) 22 20  Temp: 98.9 F (37.2 C)   SpO2: 96%     Intake/Output Summary (Last 24 hours) at 09/08/2022 0949 Last data filed at 09/08/2022 0900 Gross per 24 hour  Intake 480 ml  Output 400 ml  Net 80 ml    Filed Weights   09/06/22 0700 09/07/22 0018 09/08/22  0542  Weight: 48.1 kg 47.6 kg 45.5 kg   Weight change: -2.1 kg Body mass index is 21.71 kg/m.   Physical Exam: General exam: Elderly Caucasian female.  Not in physical distress Skin: No rashes, lesions or ulcers. HEENT: Atraumatic, normocephalic, no obvious bleeding Lungs: Clear to auscultation bilaterally CVS: A-fib RVR this morning.  GI/Abd soft, nontender, nondistended, bowel sound present CNS: Alert, awake, slow  to respond.  At baseline, unable to have a conversation Psychiatry: Sad affect Extremities: No pedal edema, no calf tenderness  Data Review: I have personally reviewed the laboratory data and studies available.  F/u labs ordered Unresulted Labs (From admission, onward)     Start     Ordered   09/06/22 0500  CBC  Daily,   R     See Hyperspace for full Linked Orders Report.   09/05/22 2104            Total time spent in review of labs and imaging, patient evaluation, formulation of plan, documentation and communication with family 22 minutes  Signed, Terrilee Croak, MD [facility] 09/08/2022

## 2022-09-09 ENCOUNTER — Other Ambulatory Visit (HOSPITAL_COMMUNITY): Payer: Self-pay

## 2022-09-09 ENCOUNTER — Inpatient Hospital Stay (HOSPITAL_COMMUNITY): Payer: Medicare Other

## 2022-09-09 DIAGNOSIS — J210 Acute bronchiolitis due to respiratory syncytial virus: Secondary | ICD-10-CM | POA: Diagnosis not present

## 2022-09-09 DIAGNOSIS — I4819 Other persistent atrial fibrillation: Secondary | ICD-10-CM | POA: Diagnosis not present

## 2022-09-09 DIAGNOSIS — I4891 Unspecified atrial fibrillation: Secondary | ICD-10-CM | POA: Diagnosis not present

## 2022-09-09 DIAGNOSIS — I513 Intracardiac thrombosis, not elsewhere classified: Secondary | ICD-10-CM | POA: Diagnosis not present

## 2022-09-09 LAB — CBC
HCT: 30.6 % — ABNORMAL LOW (ref 36.0–46.0)
Hemoglobin: 9.9 g/dL — ABNORMAL LOW (ref 12.0–15.0)
MCH: 29.3 pg (ref 26.0–34.0)
MCHC: 32.4 g/dL (ref 30.0–36.0)
MCV: 90.5 fL (ref 80.0–100.0)
Platelets: 194 10*3/uL (ref 150–400)
RBC: 3.38 MIL/uL — ABNORMAL LOW (ref 3.87–5.11)
RDW: 13.3 % (ref 11.5–15.5)
WBC: 6.5 10*3/uL (ref 4.0–10.5)
nRBC: 0 % (ref 0.0–0.2)

## 2022-09-09 LAB — PROCALCITONIN: Procalcitonin: <0.1 ng/mL

## 2022-09-09 MED ORDER — CLONAZEPAM 0.25 MG PO TBDP
0.2500 mg | ORAL_TABLET | Freq: Three times a day (TID) | ORAL | Status: DC
  Administered 2022-09-09 – 2022-09-13 (×13): 0.25 mg via ORAL
  Filled 2022-09-09 (×14): qty 1

## 2022-09-09 MED ORDER — MIDODRINE HCL 5 MG PO TABS
20.0000 mg | ORAL_TABLET | ORAL | Status: AC
  Administered 2022-09-09: 20 mg via ORAL
  Filled 2022-09-09: qty 4

## 2022-09-09 MED ORDER — DOXYCYCLINE HYCLATE 100 MG PO TABS: 100.0000 mg | ORAL_TABLET | Freq: Two times a day (BID) | ORAL | Status: DC

## 2022-09-09 NOTE — TOC Benefit Eligibility Note (Signed)
Patient Teacher, English as a foreign language completed.    The patient is currently admitted and upon discharge could be taking Eliquis 5 mg.  The current 30 day co-pay is $0.00.   The patient is insured through [place], [facility] Patient Advocate Specialist [place] Patient Advocate Team Direct Number: [phone]  Fax: [phone]

## 2022-09-09 NOTE — Progress Notes (Signed)
   09/09/22 1750  Assess: MEWS Score  Temp (!) 102.8 F (39.3 C)  BP 97/71  MAP (mmHg) 80  Pulse Rate 79  ECG Heart Rate 99  Resp 16  Level of Consciousness Alert  SpO2 96 %  O2 Device Nasal Cannula  O2 Flow Rate (L/min) 2 L/min  Assess: MEWS Score  MEWS Temp 2  MEWS Systolic 1  MEWS Pulse 0  MEWS RR 0  MEWS LOC 0  MEWS Score 3  MEWS Score Color Yellow  Assess: if the MEWS score is Yellow or Red  Were vital signs taken at a resting state? Yes  Focused Assessment No change from prior assessment  Does the patient meet 2 or more of the SIRS criteria? No  Does the patient have a confirmed or suspected source of infection? No  Provider and Rapid Response Notified? Yes (provider only)  MEWS guidelines implemented *See Row Information* Yes  Treat  MEWS Interventions Administered prn meds/treatments  Take Vital Signs  Increase Vital Sign Frequency  Yellow: Q 2hr X 2 then Q 4hr X 2, if remains yellow, continue Q 4hrs  Escalate  MEWS: Escalate Yellow: discuss with charge nurse/RN and consider discussing with provider and RRT  Notify: Charge Nurse/RN  Name of Charge Nurse/RN Notified Beverlee Nims RN  Date Charge Nurse/RN Notified 09/09/22  Time Charge Nurse/RN Notified 1800  Provider Notification  Provider Name/Title Dr. Pietro Cassis  Date Provider Notified 09/09/22  Time Provider Notified 1800  Method of Notification Page  Notification Reason Other (Comment) (febrile)  Provider response No new orders  Date of Provider Response 09/09/22  Time of Provider Response 1801  Document  Patient Outcome Stabilized after interventions  Progress note created (see row info) Yes  Assess: SIRS CRITERIA  SIRS Temperature  1  SIRS Pulse 1  SIRS Respirations  0  SIRS WBC 0  SIRS Score Sum  2

## 2022-09-09 NOTE — Evaluation (Addendum)
Physical Therapy Evaluation Patient Details Name: Michaela [name] MRN: [id number] DOB: 02-08-1956 Today's Date: 09/09/2022  History of Present Illness  Patient is a 67 y/o female who presents from [facility] on 12/28 with SOB and tachycardia. Found to be in A-fib with RVR with left atrial thrombus, sepsis secondary to RSV PNA, respiratory failure and splenic infarct. PMH includes MVP and severe MR s/p repair and annuloplasty, history of astrocytoma s/p radiation, and surgical resection, complicated with obstructive hydrocephalus, now with VP shunt.  Clinical Impression  Patient presents with spastic quadriparesis, difficulty with speech, tachycardia and impaired mobility s/p above. Pt is from [facility] and is w/c bound at baseline. Pt is dependent for all care including using lift to transfer to w/c daily, per pt report. Family not present to verify and unable to get in touch with facility due to holiday. Upon arrival, pt with HR in A-fib ranging from 108-150s bpm sustaining in high 140s with PROM of extremities. Tolerated PROM/AAROM of all extremities at bed level. Noted to have slow processing and needs repetition to follow simple commands. Not safe to attempt OOB to chair at this time or EOB due to not having second person and HR. Will likely follow up 1 additional time vs once/week for positioning and to continue to address spasticity as per nurse (via sister), pt's spasticity is worse here in the hospital than at baseline and wants her to be able to transfer to a chair when she returns to SNF. Recommend OOB with nursing using lift when medically stable. Will follow acutely.     Recommendations for follow up therapy are one component of a multi-disciplinary discharge planning process, led by the attending physician.  Recommendations may be updated based on patient status, additional functional criteria and insurance authorization.  Follow Up Recommendations Skilled nursing-short term rehab  (<3 hours/day) Can patient physically be transported by private vehicle: No    Assistance Recommended at Discharge Frequent or constant Supervision/Assistance  Patient can return home with the following  Two people to help with walking and/or transfers;A lot of help with bathing/dressing/bathroom    Equipment Recommendations None recommended by PT  Recommendations for Other Services       Functional Status Assessment Patient has had a recent decline in their functional status and/or demonstrates limited ability to make significant improvements in function in a reasonable and predictable amount of time     Precautions / Restrictions Precautions Precautions: Fall;Other (comment) Precaution Comments: watch HR Restrictions Weight Bearing Restrictions: No      Mobility  Bed Mobility               General bed mobility comments: Did not have second person to attempt EOB for safety and HR 150 bpm with PROM in bed.    Transfers                        Ambulation/Gait                  Stairs            Wheelchair Mobility    Modified Rankin (Stroke Patients Only)       Balance                                             Pertinent Vitals/Pain Pain Assessment Pain Assessment: Faces  Faces Pain Scale: No hurt    Home Living Family/patient expects to be discharged to:: Skilled nursing facility [facility])     Type of Home: [place]                  Prior Function Prior Level of Function : Needs assist       Physical Assist : Mobility (physical);ADLs (physical) Mobility (physical): Bed mobility;Transfers ADLs (physical): Grooming;Bathing;Dressing;Toileting Mobility Comments: Per pt, she gets up to her w/c daily using a lift. Per nurse (who spoke with sister), she gets therapy at SNF? ADLs Comments: Dependent for all care     Hand Dominance   Dominant Hand: Right    Extremity/Trunk  Assessment   Upper Extremity Assessment Upper Extremity Assessment: Defer to OT evaluation (Spasticity present in BUEs, no active movement appreciated functionally.)    Lower Extremity Assessment Lower Extremity Assessment: RLE deficits/detail;LLE deficits/detail;Generalized weakness RLE Deficits / Details: Difficulty with passive knee flexion due to spasticity/tone, foot drop with decreased DF LLE Deficits / Details: Contracted into 40 degrees knee flexion, unable to extend fully, favors Hip IR and decreased DF of ankle       Communication   Communication: Expressive difficulties;HOH  Cognition Arousal/Alertness: Awake/alert Behavior During Therapy: Flat affect Overall Cognitive Status: Difficult to assess                                 General Comments: Slow processing. Follows simple commands        General Comments General comments (skin integrity, edema, etc.): HR 108-150s bpm sustaining in 140s for most of session. No family present. Attempted to call [facility] for more info regarding PLOF but not able to get a hold of anyone.    Exercises General Exercises - Upper Extremity Shoulder Flexion: PROM, Both, 5 reps, Supine Elbow Flexion: PROM, Both, 5 reps, Supine Elbow Extension: PROM, Both, 5 reps, Supine (limited) Wrist Flexion: PROM, Both, 5 reps, Supine Wrist Extension: PROM, Both, 5 reps, Supine Digit Composite Flexion: AAROM, Both, 5 reps, Supine Composite Extension: AAROM, Both, 5 reps, Supine General Exercises - Lower Extremity Ankle Circles/Pumps: PROM, Both, 5 reps, Supine Heel Slides: PROM, Both, 5 reps, Supine Hip ABduction/ADduction: PROM, Both, 5 reps, Supine   Assessment/Plan    PT Assessment Patient needs continued PT services  PT Problem List Decreased range of motion;Decreased strength;Decreased mobility;Impaired tone;Decreased cognition;Decreased balance       PT Treatment Interventions Patient/family education;Manual  techniques;Neuromuscular re-education;Balance training;Therapeutic exercise    PT Goals (Current goals can be found in the Care Plan section)  Acute Rehab PT Goals Patient Stated Goal: none stated PT Goal Formulation: Patient unable to participate in goal setting Time For Goal Achievement: 09/23/22 Potential to Achieve Goals: Fair    Frequency Min 1X/week     Co-evaluation               AM-PAC PT "6 Clicks" Mobility  Outcome Measure Help needed turning from your back to your side while in a flat bed without using bedrails?: Total Help needed moving from lying on your back to sitting on the side of a flat bed without using bedrails?: Total Help needed moving to and from a bed to a chair (including a wheelchair)?: Total Help needed standing up from a chair using your arms (e.g., wheelchair or bedside chair)?: Total Help needed to walk in hospital room?: Total Help needed climbing 3-5 steps with  a railing? : Total 6 Click Score: 6    End of Session Equipment Utilized During Treatment: Oxygen Activity Tolerance: Patient tolerated treatment well Patient left: in bed;with call bell/phone within reach;with bed alarm set Nurse Communication: Mobility status;Need for lift equipment PT Visit Diagnosis: Muscle weakness (generalized) (M62.81)    Time: 8251-8984 PT Time Calculation (min) (ACUTE ONLY): 14 min   Charges:   PT Evaluation $PT Eval Moderate Complexity: 1 Mod          Marisa Severin, PT, DPT Acute Rehabilitation Services Secure chat preferred Office [place] 09/09/2022, 3:01 PM

## 2022-09-09 NOTE — Progress Notes (Addendum)
More lethargic with increased O2 requirement.  Presented at bedside.  Lethargic but following commands.  Mild rhonchorous sounds noted on exam.  Chest x-ray and stat ABG ordered and are pending.  O2 saturation 96% on 4 L.   Time: 15 minutes.

## 2022-09-09 NOTE — Progress Notes (Signed)
Rounding Note    Patient Name: Michaela [name] Date of Encounter: 09/09/2022  [place] Cardiologist: O'Neal  Subjective   Appears comfortable, in no distress this AM. It is my first time meeting her, but she is slow to respond to voice this morning. This was noted by primary team this AM.  Inpatient Medications    Scheduled Meds:  apixaban  5 mg Oral BID   atorvastatin  20 mg Oral Daily   Chlorhexidine Gluconate Cloth  6 each Topical Q0600   digoxin  0.125 mg Oral Daily   escitalopram  20 mg Oral Daily   guaiFENesin  600 mg Oral BID   melatonin  3 mg Oral QHS   metoprolol tartrate  37.5 mg Oral Q6H   mirabegron ER  25 mg Oral Daily   pantoprazole  40 mg Oral Daily   polyvinyl alcohol  1 drop Both Eyes QID   saccharomyces boulardii  250 mg Oral BID   Continuous Infusions:  ampicillin-sulbactam (UNASYN) IV 1.5 g (09/09/22 0620)   PRN Meds: acetaminophen, clonazePAM, docusate sodium, mouth rinse, polyethylene glycol   Vital Signs    Vitals:   09/09/22 0200 09/09/22 0300 09/09/22 0400 09/09/22 0845  BP:   104/71 103/63  Pulse: 64 64  (!) 41  Resp: 11 10  (!) 8  Temp:   97.9 F (36.6 C) 98.5 F (36.9 C)  TempSrc:   Oral Axillary  SpO2: 91% 94%  98%  Weight:      Height:        Intake/Output Summary (Last 24 hours) at 09/09/2022 0926 Last data filed at 09/09/2022 8242 Gross per 24 hour  Intake 209.19 ml  Output 850 ml  Net -640.81 ml      09/08/2022    5:42 AM 09/07/2022   12:18 AM 09/06/2022    7:00 AM  Last 3 Weights  Weight (lbs) 100 lb 5 oz 104 lb 15 oz 106 lb 0.7 oz  Weight (kg) 45.5 kg 47.6 kg 48.1 kg      Telemetry    Atrial fibrillation, averaging 70s-80s with occasional brief spikes to 100-110s - Personally Reviewed  ECG    No new - Personally Reviewed  Physical Exam   GEN: No acute distress.   Neck: No JVD at 45 degrees Cardiac: irregularly irregular, no murmurs, rubs, or gallops.  Respiratory: Rhonchi noted, no  wheezing appreciated GI: Soft, nontender, non-distended  MS: No edema; sleeping in fetal/contracted position Neuro:  Nonfocal  Psych: Slow to respond to voice commands  Labs    High Sensitivity Troponin:   Recent Labs  Lab 09/05/22 2040 09/05/22 2247  TROPONINIHS <2 5     Chemistry Recent Labs  Lab 09/05/22 1815 09/05/22 1825 09/05/22 1826 09/06/22 0125 09/07/22 0052  NA 136 136 136 136 137  K 3.7 3.6 3.7 3.6 3.6  CL 102 101  --  105 102  CO2 24  --   --  23 26  GLUCOSE 89 84  --  93 82  BUN 19 19  --  15 14  CREATININE 0.65 0.60  --  0.49 0.55  CALCIUM 8.0*  --   --  7.6* 8.1*  MG  --   --   --  1.7 2.2  PROT 5.5*  --   --   --   --   ALBUMIN 2.7*  --   --   --   --   AST 31  --   --   --   --  ALT 28  --   --   --   --   ALKPHOS 64  --   --   --   --   BILITOT 0.4  --   --   --   --   GFRNONAA >60  --   --  >60 >60  ANIONGAP 10  --   --  8 9    Lipids No results for input(s): "CHOL", "TRIG", "HDL", "LABVLDL", "LDLCALC", "CHOLHDL" in the last 168 hours.  Hematology Recent Labs  Lab 09/07/22 0052 09/08/22 0032 09/09/22 0027  WBC 6.1 8.7 6.5  RBC 3.30* 3.79* 3.38*  HGB 9.7* 11.0* 9.9*  HCT 30.3* 35.1* 30.6*  MCV 91.8 92.6 90.5  MCH 29.4 29.0 29.3  MCHC 32.0 31.3 32.4  RDW 13.4 13.3 13.3  PLT 187 230 194   Thyroid No results for input(s): "TSH", "FREET4" in the last 168 hours.  BNP Recent Labs  Lab 09/05/22 1815  BNP 706.4*    DDimer No results for input(s): "DDIMER" in the last 168 hours.   Radiology    DG CHEST PORT 1 VIEW  Result Date: 09/09/2022 CLINICAL DATA:  200808 with hypoxia. EXAM: PORTABLE CHEST 1 VIEW COMPARISON:  Portable chest 09/05/2022 FINDINGS: 06:46 a.m. Superimposed VP shunt tubing is again noted. There is a mitral valve ring prosthesis. Mild cardiomegaly with aortic atherosclerosis and stable mediastinum. Central vascular prominence continues to be seen with mild basal interstitial edema and small pleural effusions. The lungs  are expiratory. There is increased bibasilar haziness which could be due to atelectasis, pneumonitis or ground-glass edema. The mid to upper lung fields are clear of focal opacities. In all other respects there are no further changes. IMPRESSION: 1. Expiratory chest with increased bibasilar haziness which could be due to atelectasis, pneumonitis or ground-glass edema. 2. Mild cardiomegaly with central vascular prominence and mild basal interstitial edema. Electronically Signed   By: Telford Nab M.D.   On: 09/09/2022 07:09    Cardiac Studies   Echo 09/06/22  1. Left ventricular ejection fraction, by estimation, is 60 to 65%. The  left ventricle has normal function. The left ventricle has no regional  wall motion abnormalities. Left ventricular diastolic function could not  be evaluated.   2. Right ventricular systolic function is normal. The right ventricular  size is normal. There is normal pulmonary artery systolic pressure.   3. Left atrial size was mildly dilated.   4. The mitral valve is degenerative. Mild mitral valve regurgitation.  Mild mitral stenosis. The mean mitral valve gradient is 7.5 mmHg with  average heart rate of 120 bpm. There is a 36 prosthetic prosthetic  annuloplasty ring present in the mitral  position.   5. The aortic valve is tricuspid. Aortic valve regurgitation is not  visualized. No aortic stenosis is present.   6. The inferior vena cava is normal in size with greater than 50%  respiratory variability, suggesting right atrial pressure of 3 mmHg.   Patient Profile     67 y.o. female with history of mitral valve prolapse status post complex mitral valve repair in 2013 (Alfieri edge-to-edge repair with Gore-Tex cords, 36 mm annuloplasty ring), childhood astrocytoma status post radiation with surgical resection complicated by obstructive hydrocephalus with VP shunt who was admitted on 09/05/2022 for acute hypoxic respiratory failure secondary to RSV and pneumonia.   Cardiology was consulted for new onset A-fib with RVR and left atrial appendage thrombus and splenic infarct.  Echocardiogram shows ejection fraction 60 to 65%,  mild left atrial enlargement, status post mitral valve repair with mild mitral stenosis, mild mitral regurgitation.   Assessment & Plan    Persistent atrial fibrillation LA appendage thrombus with splenic infarct History of MVR -continue apixaban 5 mg BID -cannot cardiovert given presence of thrombus. Will need 6-8 weeks anticoagulation and then TEE prior to attempt at cardioversion -continue rate control. Continue digoxin and metoprolol. Lenient goal given respiratory illness. Over time rates decreased significantly, continue current management  RSV pneumonia -per primary team  For questions or updates, please contact [place] Please consult [email or web address] for contact info under     Signed, Buford Dresser, MD  09/09/2022, 9:26 AM

## 2022-09-09 NOTE — Progress Notes (Signed)
PROGRESS NOTE  Michaela [name]  DOB: 1956-03-28  PCP: Javier Glazier, MD BJY:[id number]  DOA: 09/05/2022  LOS: 4 days  Hospital Day: 5  Brief narrative: Michaela [name] is a 67 y.o. female with PMH significant for mitral valve prolapse and severe MR s/p repair and annuloplasty, history of astrocytoma s/p radiation, and surgical resection, complicated with obstructive hydrocephalus, now with VP shunt.  She has been living at [facility] for last 3 years, wheelchair bound at baseline. Patient apparently was having cough, shortness of breath a few days.  Labs showed elevated WBC count and she was started on Bactrim but her symptoms did not improve and hence she was sent to the ED on 12/28.  In the ED, patient was noted to be in A-fib with RVR with heart rate up to 150s, O2 sat was low Lactic acid level was elevated to 5.8 Respiratory virus panel was positive for RSV CTA chest did not show any evidence of pulm embolism.  But it showed left atrial enlargement with left atrial appendage clot and splenic infarct.  It also showed possible bibasilar infiltrates Patient was admitted to ICU Cardiology was consulted as well 12/30, transferred out to [facility]  Subjective: Patient was seen and examined this morning.   Propped up in bed.  Not in distress.  On 3 L oxygen by nasal collar.  Heart rate better controlled today.  Events from last night noted.  Patient had fever of 102.3 patient was lethargic and required high flow oxygen.  Chest x-ray showed possible pneumonia and she was started on IV Unasyn.   Assessment and plan: Sepsis secondary to RSV pneumonia  Acute hypoxic respiratory failure Lactic acidosis Presented with worsening cough, shortness of breath, lactate level on admission was elevated to 5.8. CT chest suggested possible bibasilar infiltrates. She was started on supportive care for RSV. Events of last night noted.  Patient had fever of 102.3, lethargy, hypoxia.   Chest x-ray showed possible pneumonia.  IV Unasyn was started.  However WBC count remained normal.  Procalcitonin remain normal. On 3 L oxygen by nasal cannula today.  Continue to monitor. Recent Labs  Lab 09/05/22 1815 09/05/22 2040 09/06/22 0125 09/06/22 0535 09/07/22 0052 09/08/22 0032 09/09/22 0027  WBC 9.3  --  7.9  --  6.1 8.7 6.5  LATICACIDVEN 1.7 5.8* 1.0 1.2  --   --   --   PROCALCITON  --   --   --   --   --   --  <0.10    New onset A-fib with RVR  History of mitral prolapse, severe MR s/p repair and annuloplasty Heart rate was elevated to 150s in the ED. Cardiology was consulted.  Currently on metoprolol 25 mg every 6 hours, digoxin 0.125 mg daily. Currently on IV heparin drip.  Plan to start on oral anticoagulation prior to discharge. Unable to cardiovert because of presence of atrial thrombus.  Large left atrial thrombus Acute splenic infarction CT angio chest obtained on admission showed a large left atrial thrombus probably because of splenic infarct.  Started on heparin drip. Cardiology following. Echo showed normal EF,, previous mitral valve repair with mild mitral stenosis and mitral regurgitation. Defer to cardiology for the indication and timing of TEE    Astrocytoma s/p resection, obstructive hydrocephalus now with VP shunt supportive care, mental status improving per sister   Chronic immobility Bedbound at nursing home for last few years  Goals of care   Code Status: DNR  Scheduled Meds:  apixaban  5 mg Oral BID   atorvastatin  20 mg Oral Daily   Chlorhexidine Gluconate Cloth  6 each Topical Q0600   digoxin  0.125 mg Oral Daily   escitalopram  20 mg Oral Daily   guaiFENesin  600 mg Oral BID   melatonin  3 mg Oral QHS   metoprolol tartrate  37.5 mg Oral Q6H   mirabegron ER  25 mg Oral Daily   pantoprazole  40 mg Oral Daily   polyvinyl alcohol  1 drop Both Eyes QID   saccharomyces boulardii  250 mg Oral BID    PRN meds: acetaminophen,  clonazePAM, docusate sodium, mouth rinse, polyethylene glycol   Infusions:   ampicillin-sulbactam (UNASYN) IV 1.5 g (09/09/22 7654)    Skin assessment:     Nutritional status:  Body mass index is 21.71 kg/m.          Diet:  Diet Order             DIET DYS 3 Room service appropriate? Yes; Fluid consistency: Nectar Thick  Diet effective now                   DVT prophylaxis:  SCDs Start: 09/05/22 2333 apixaban (ELIQUIS) tablet 5 mg   Antimicrobials: None Fluid: None Consultants: Cardiology Family Communication: Sister not at bedside today  Status is: Inpatient  Continue in-hospital care because: Cardiology adjusting medication for rate control.   Level of care: Telemetry Cardiac   Dispo: The patient is from: Long-term nursing at [facility]              Anticipated d/c is to: Back to [facility] hopefully tomorrow if no recurrence of fever.              Patient currently is not medically stable to d/c.   Difficult to place patient No    Antimicrobials: Anti-infectives (From admission, onward)    Start     Dose/Rate Route Frequency Ordered Stop   09/09/22 1000  doxycycline (VIBRA-TABS) tablet 100 mg  Status:  Discontinued        100 mg Oral Every 12 hours 09/09/22 0731 09/09/22 0813   09/08/22 2200  ampicillin-sulbactam (UNASYN) 1.5 g in sodium chloride 0.9 % 100 mL IVPB        1.5 g 200 mL/hr over 30 Minutes Intravenous Every 8 hours 09/08/22 2113     09/06/22 2100  vancomycin (VANCOREADY) IVPB 500 mg/100 mL  Status:  Discontinued        500 mg 100 mL/hr over 60 Minutes Intravenous Every 24 hours 09/05/22 1847 09/05/22 2355   09/06/22 1000  ceFEPIme (MAXIPIME) 2 g in sodium chloride 0.9 % 100 mL IVPB  Status:  Discontinued        2 g 200 mL/hr over 30 Minutes Intravenous Every 12 hours 09/05/22 1847 09/05/22 2355   09/05/22 1830  ceFEPIme (MAXIPIME) 2 g in sodium chloride 0.9 % 100 mL IVPB        2 g 200 mL/hr over 30 Minutes Intravenous  Once 09/05/22  1816 09/05/22 2000   09/05/22 1830  metroNIDAZOLE (FLAGYL) IVPB 500 mg        500 mg 100 mL/hr over 60 Minutes Intravenous  Once 09/05/22 1816 09/05/22 2103   09/05/22 1830  vancomycin (VANCOCIN) IVPB 1000 mg/200 mL premix        1,000 mg 200 mL/hr over 60 Minutes Intravenous  Once 09/05/22 1816 09/05/22 2103  Objective: Vitals:   09/09/22 0400 09/09/22 0845  BP: 104/71 103/63  Pulse:  (!) 41  Resp:  (!) 8  Temp: 97.9 F (36.6 C) 98.5 F (36.9 C)  SpO2:  98%    Intake/Output Summary (Last 24 hours) at 09/09/2022 0934 Last data filed at 09/09/2022 1031 Gross per 24 hour  Intake 209.19 ml  Output 850 ml  Net -640.81 ml   Filed Weights   09/06/22 0700 09/07/22 0018 09/08/22 0542  Weight: 48.1 kg 47.6 kg 45.5 kg   Weight change:  Body mass index is 21.71 kg/m.   Physical Exam: General exam: Elderly Caucasian female.  Not in physical distress Skin: No rashes, lesions or ulcers. HEENT: Atraumatic, normocephalic, no obvious bleeding Lungs: Clear to auscultation bilaterally at the time of my evaluation this morning. CVS: A-fib, rate controlled. GI/Abd soft, nontender, nondistended, bowel sound present CNS: Alert, awake, slow to respond.  At baseline, unable to have a conversation Psychiatry: Sad affect Extremities: No pedal edema, no calf tenderness  Data Review: I have personally reviewed the laboratory data and studies available.  F/u labs ordered Unresulted Labs (From admission, onward)     Start     Ordered   09/09/22 0620  Blood gas, arterial  ONCE - STAT,   STAT       Question:  Room air or oxygen  Answer:  Oxygen   09/09/22 0619   09/06/22 0500  CBC  Daily,   R     See Hyperspace for full Linked Orders Report.   09/05/22 2104            Total time spent in review of labs and imaging, patient evaluation, formulation of plan, documentation and communication with family 67 minutes  Signed, Terrilee Croak, MD [facility] 09/09/2022

## 2022-09-10 DIAGNOSIS — J210 Acute bronchiolitis due to respiratory syncytial virus: Secondary | ICD-10-CM | POA: Diagnosis not present

## 2022-09-10 DIAGNOSIS — I4819 Other persistent atrial fibrillation: Secondary | ICD-10-CM | POA: Diagnosis not present

## 2022-09-10 LAB — CULTURE, BLOOD (ROUTINE X 2)
Culture: NO GROWTH
Culture: NO GROWTH
Special Requests: ADEQUATE
Special Requests: ADEQUATE

## 2022-09-10 LAB — CBC
HCT: 31.7 % — ABNORMAL LOW (ref 36.0–46.0)
Hemoglobin: 10.2 g/dL — ABNORMAL LOW (ref 12.0–15.0)
MCH: 29.5 pg (ref 26.0–34.0)
MCHC: 32.2 g/dL (ref 30.0–36.0)
MCV: 91.6 fL (ref 80.0–100.0)
Platelets: 185 10*3/uL (ref 150–400)
RBC: 3.46 MIL/uL — ABNORMAL LOW (ref 3.87–5.11)
RDW: 13.4 % (ref 11.5–15.5)
WBC: 9.6 10*3/uL (ref 4.0–10.5)
nRBC: 0 % (ref 0.0–0.2)

## 2022-09-10 MED ORDER — METOPROLOL TARTRATE 25 MG PO TABS
25.0000 mg | ORAL_TABLET | Freq: Four times a day (QID) | ORAL | Status: DC
  Administered 2022-09-10 – 2022-09-12 (×7): 25 mg via ORAL
  Filled 2022-09-10 (×9): qty 1

## 2022-09-10 MED ORDER — MIDODRINE HCL 5 MG PO TABS
20.0000 mg | ORAL_TABLET | ORAL | Status: AC
  Administered 2022-09-10: 20 mg via ORAL
  Filled 2022-09-10: qty 4

## 2022-09-10 NOTE — Progress Notes (Signed)
RT called at bedside for Pt unable to clear cough. Pt does not seem to be in any respiratory distress at this time. Pt on 3L sats in upper 90's. Flutter valve was used but is unable to provide strong exhalation.RN aware, RT will monitor as needed.

## 2022-09-10 NOTE — TOC Initial Note (Signed)
Transition of Care [facility]) - Initial/Assessment Note    Patient Details  Name: Michaela [name] MRN: [id number] Date of Birth: [birth year]  Transition of Care [facility]) CM/SW Contact:    Bethann Berkshire, [place] Phone Number: 09/10/2022, 10:37 AM  Clinical Narrative:                  CSW met with pt and pt sister bedside. Sister confirms pt is LTC resident at [facility] for last 2 years. Plan is to return at [place]. Pt would need PTAR at that time.   CSW confirmed with [facility] that pt is LTC there. [place] contact is [place] ([phone])  Fl2 completed and sent in hub.   Expected Discharge Plan: Skilled Nursing Facility Barriers to Discharge: Continued Medical Work up   Patient Goals and CMS Choice            Expected Discharge Plan and Services       Living arrangements for the past 2 months: [place]                                      Prior Living Arrangements/Services Living arrangements for the past 2 months: [place] Lives with:: Facility Resident Patient language and need for interpreter reviewed:: Yes        Need for Family Participation in Patient Care: Yes (Comment) Care giver support system in place?: Yes (comment)   Criminal Activity/Legal Involvement Pertinent to Current Situation/Hospitalization: No - Comment as needed  Activities of Daily Living      Permission Sought/Granted                  Emotional Assessment Appearance:: Appears stated age Attitude/Demeanor/Rapport: Lethargic Affect (typically observed): Quiet Orientation: : Oriented to Self Alcohol / Substance Use: Not Applicable Psych Involvement: No (comment)  Admission diagnosis:  RSV (acute bronchiolitis due to respiratory syncytial virus) [J21.0] Embolism and thrombosis ([place]) [I74.9] Splenic infarct [D73.5] Atrial fibrillation with rapid ventricular response ([place]) [I48.91] Puerperal sepsis with acute hypoxic respiratory failure ([place])  [O85, R65.20, J96.01] Sepsis, due to unspecified organism, unspecified whether acute organ dysfunction present [facility]) [A41.9] Patient Active Problem List   Diagnosis Date Noted   RSV (acute bronchiolitis due to respiratory syncytial virus) 09/06/2022   Embolism and thrombosis ([employer]) 09/06/2022   Thrombus of left atrial appendage 09/06/2022   Atrial fibrillation with rapid ventricular response ([place]) 09/06/2022   Dry eyes 06/25/2019   Tremors of nervous system 05/24/2019   Overactive bladder    Rash 01/01/2019   Neurocognitive deficits 09/03/2018   Swallowing dysfunction 05/07/2018   Bilateral shoulder pain 01/06/2017   Chronic constipation 03/21/2016   Loss of weight    Vitamin D deficiency    Osteoporosis    Hyperlipidemia    Paroxysmal atrial fibrillation ([place]) 03/03/2012   Pituitary disorder ([place]) 02/11/2012   Ataxia 01/29/2012   Atrial fibrillation with RVR (HCC) 11/22/2011   Normochromic normocytic anemia 50/27/7412   Diastolic CHF, chronic (HCC) 11/13/2011   Pleural effusion, left 11/11/2011   S/P mitral valve repair 10/15/2011   Brain tumor, astrocytoma ([place]) 10/15/2011   Neuromuscular disorder ([place]) 10/15/2011   Neurogenic bladder 10/15/2011   Arthritis 10/15/2011   Mitral regurgitation due to cusp prolapse 07/08/2011   PCP:  Javier Glazier, MD Pharmacy:   [facility] [place] [address] [place]  [address] [place] [address] Phone: [phone] Fax: [phone]     Social Determinants of Health (SDOH) Social History: SDOH Screenings   Food Insecurity: No Food Insecurity (01/22/2018)  Transportation Needs: No Transportation Needs (01/22/2018)  Depression (PHQ2-9): Low Risk  (03/19/2019)  Financial Resource Strain: Low Risk  (01/22/2018)  Physical Activity: Inactive (01/22/2018)  Social Connections: Moderately Isolated (01/22/2018)  Stress: No Stress Concern Present (01/22/2018)  Tobacco Use: Medium  Risk (09/05/2022)   SDOH Interventions:     Readmission Risk Interventions     No data to display

## 2022-09-10 NOTE — NC FL2 (Signed)
[place] LEVEL OF CARE FORM     IDENTIFICATION  Patient Name: Michaela [name] Birthdate: 1955-11-12 Sex: female Admission Date (Current Location): 09/05/2022  [facility] and [place] Number:  Herbalist and Address:  [facility], [place] [address] [place], [place] [address]      Provider Number: [id number]  Attending Physician Name and Address:  Terrilee Croak, MD  Relative Name and Phone Number:  Carolyne Littles (Sister)  [phone] (Home Phone)    Current Level of Care: Hospital Recommended Level of Care: [place] Prior Approval Number:    Date Approved/Denied:   PASRR Number:    Discharge Plan: SNF    Current Diagnoses: Patient Active Problem List   Diagnosis Date Noted   RSV (acute bronchiolitis due to respiratory syncytial virus) 09/06/2022   Embolism and thrombosis ([place]) 09/06/2022   Thrombus of left atrial appendage 09/06/2022   Atrial fibrillation with rapid ventricular response ([place]) 09/06/2022   Dry eyes 06/25/2019   Tremors of nervous system 05/24/2019   Overactive bladder    Rash 01/01/2019   Neurocognitive deficits 09/03/2018   Swallowing dysfunction 05/07/2018   Bilateral shoulder pain 01/06/2017   Chronic constipation 03/21/2016   Loss of weight    Vitamin D deficiency    Osteoporosis    Hyperlipidemia    Paroxysmal atrial fibrillation ([place]) 03/03/2012   Pituitary disorder ([place]) 02/11/2012   Ataxia 01/29/2012   Atrial fibrillation with RVR ([place]) 11/22/2011   Normochromic normocytic anemia 39/11/90   Diastolic CHF, chronic (HCC) 11/13/2011   Pleural effusion, left 11/11/2011   S/P mitral valve repair 10/15/2011   Brain tumor, astrocytoma ([place]) 10/15/2011   Neuromuscular disorder ([place]) 10/15/2011   Neurogenic bladder 10/15/2011   Arthritis 10/15/2011   Mitral regurgitation due to cusp prolapse 07/08/2011    Orientation RESPIRATION BLADDER Height & Weight     Self  O2 (3L [place])  Incontinent Weight: 105 lb 13.1 oz (48 kg) Height:  '4\' 9"'$  (144.8 cm)  BEHAVIORAL SYMPTOMS/MOOD NEUROLOGICAL BOWEL NUTRITION STATUS      Incontinent Diet (see d/c summary)  AMBULATORY STATUS COMMUNICATION OF NEEDS Skin   Extensive Assist Verbally Normal                       Personal Care Assistance Level of Assistance  Bathing, Feeding, Dressing Bathing Assistance: Maximum assistance Feeding assistance: Independent Dressing Assistance: Maximum assistance     Functional Limitations Info  Sight, Hearing, Speech Sight Info: Impaired Hearing Info: Adequate Speech Info: Adequate    SPECIAL CARE FACTORS FREQUENCY                       Contractures Contractures Info: Not present    Additional Factors Info  Code Status, Allergies Code Status Info: DNR Allergies Info: Compazine, Prochlorperazine, Prochlorperazine Edisylate, Prochlorperazine Maleate           Current Medications (09/10/2022):  This is the current hospital active medication list Current Facility-Administered Medications  Medication Dose Route Frequency Provider Last Rate Last Admin   acetaminophen (TYLENOL) tablet 650 mg  650 mg Oral Q6H PRN Irene Pap N, DO   650 mg at 09/09/22 1755   ampicillin-sulbactam (UNASYN) 1.5 g in sodium chloride 0.9 % 100 mL IVPB  1.5 g Intravenous Q8H [name] D, RPH 200 mL/hr at 09/10/22 0427 1.5 g at 09/10/22 0427   apixaban (ELIQUIS) tablet 5 mg  5 mg Oral BID Kirk Ruths  S, MD   5 mg at 09/10/22 1036   atorvastatin (LIPITOR) tablet 20 mg  20 mg Oral Daily Dahal, Binaya, MD   20 mg at 09/10/22 1036   Chlorhexidine Gluconate Cloth 2 % PADS 6 each  6 each Topical Q0600 [name], MD   6 each at 09/10/22 0428   clonazePAM (KLONOPIN) disintegrating tablet 0.25 mg  0.25 mg Oral TID Terrilee Croak, MD   0.25 mg at 09/10/22 1036   docusate sodium (COLACE) capsule 100 mg  100 mg Oral BID PRN [name], MD       escitalopram (LEXAPRO) tablet 20 mg  20 mg Oral  Daily Dahal, Binaya, MD   20 mg at 09/10/22 1036   guaiFENesin (MUCINEX) 12 hr tablet 600 mg  600 mg Oral BID Dahal, Marlowe Aschoff, MD   600 mg at 09/10/22 1036   melatonin tablet 3 mg  3 mg Oral QHS Chand, Currie Paris, MD   3 mg at 09/09/22 2054   metoprolol tartrate (LOPRESSOR) tablet 25 mg  25 mg Oral Q6H Hall, Carole N, DO   25 mg at 09/10/22 1036   mirabegron ER (MYRBETRIQ) tablet 25 mg  25 mg Oral Daily Dahal, Marlowe Aschoff, MD   25 mg at 09/10/22 1042   Oral care mouth rinse  15 mL Mouth Rinse PRN Candee Furbish, MD       pantoprazole (PROTONIX) EC tablet 40 mg  40 mg Oral Daily Dahal, Binaya, MD   40 mg at 09/10/22 1037   polyethylene glycol (MIRALAX / GLYCOLAX) packet 17 g  17 g Oral Daily PRN [name], MD       polyvinyl alcohol (LIQUIFILM TEARS) 1.4 % ophthalmic solution 1 drop  1 drop Both Eyes QID Dahal, Binaya, MD   1 drop at 09/10/22 1042   saccharomyces boulardii (FLORASTOR) capsule 250 mg  250 mg Oral BID Terrilee Croak, MD   250 mg at 09/10/22 1036     Discharge Medications: Please see discharge summary for a list of discharge medications.  Relevant Imaging Results:  Relevant Lab Results:   Additional Information SSN [place], [place]

## 2022-09-10 NOTE — Progress Notes (Addendum)
Speech Language Pathology Treatment: Dysphagia  Patient Details Name: Michaela [name] MRN: [id number] DOB: Mar 04, 1956 Today's Date: 09/10/2022 Time: 4818-5631 SLP Time Calculation (min) (ACUTE ONLY): 35 min  Assessment / Plan / Recommendation Clinical Impression  Pt greeted lying in bed, SLP woke her and repositioned her upright after which she immediately coughed with erythemic face and inability to clear. This went on for over approximately 30 minutes - Despite total cues, pt unable to clear - pt did not consistently recover. After just a few minutes, she would begin coughing again without ability to expectorate. BP low and HR variable prior to SLP waking pt. RT called and he reports pt with congestion at lower lungs but upon ausculation of neck, she appeared clear.   Overt approximately 35 minutes, she was unable to clear secretions. Provided oral suction removing minimal frothy secretions. With her fever yesterday, CXR showing worsening status, inability to cough to clear and her low BP, recommend NPO except Single small ice chips and required medications and advise she undergo MBS when she is medically able. BP continued to be low during session - Did not provide pt with po due to her medical status. Left pt in room with RT and nursing.   Recommend closely monitor pt as she is unable to call for assist when needed (? whether due to her spasticity or cognitively). Will follow up.   Inquired re: pt using soft call bell but she does not have adequate arm or head movement for use, thus recommend frequently checking on her.     HPI HPI: 67 yo female adm to [facility] with cough and increased dyspnea over night- found to have RSV and a clot.  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  During imaging, she was found to have RSV and Left atrial appendage clot - was transferred out of  ICU - she is on nasal cannula oxygen.  Pt is now on anticoagulant per cardiology for her clot.  Swallow eval ordered.  Pt admits to issues with swallowing, sensing food retention in esophagus and also high in the throat *based SLP pointing to isolate area of retention. She denies having lost weight.  Is unable to feed herself and resides at [facility].  Brain imaging has shown atrophy of brainstem and cerebellar area.      SLP Plan  Continue with current plan of care;MBS (when medically cleared MBS is needed)      Recommendations for follow up therapy are one component of a multi-disciplinary discharge planning process, led by the attending physician.  Recommendations may be updated based on patient status, additional functional criteria and insurance authorization.    Recommendations  Diet recommendations: NPO (single small ice chips) Medication Administration: Other (Comment) (required meds with nectar or small amount of applesauce)                Oral Care Recommendations: Oral care BID Follow Up Recommendations: Follow physician's recommendations for discharge plan and follow up therapies SLP Visit Diagnosis: Dysphagia, oral phase (R13.11);Dysphagia, unspecified (R13.10) Plan: Continue with current plan of care;MBS (when medically cleared MBS is needed)          Michaela Lime, MS [facility] SLP Acute Rehab Services Office [phone] Pager [phone]  Michaela [name]  09/10/2022, 8:28 AM

## 2022-09-10 NOTE — Progress Notes (Addendum)
PROGRESS NOTE  Michaela [name]  DOB: 22-May-1956  PCP: Javier Glazier, MD XBD:[id number]  DOA: 09/05/2022  LOS: 5 days  Hospital Day: 6  Brief narrative: Michaela [name] is a 67 y.o. female with PMH significant for mitral valve prolapse and severe MR s/p repair and annuloplasty, history of astrocytoma s/p radiation, and surgical resection, complicated with obstructive hydrocephalus, now with VP shunt.  She has been living at [facility] for last 3 years, wheelchair bound at baseline. Patient apparently was having cough, shortness of breath a few days.  Labs showed elevated WBC count and she was started on Bactrim but her symptoms did not improve and hence she was sent to the ED on 12/28.  In the ED, patient was noted to be in A-fib with RVR with heart rate up to 150s, O2 sat was low Lactic acid level was elevated to 5.8 Respiratory virus panel was positive for RSV CTA chest did not show any evidence of pulm embolism.  But it showed left atrial enlargement with left atrial appendage clot and splenic infarct.  It also showed possible bibasilar infiltrates Patient was admitted to ICU Cardiology was consulted as well 12/30, transferred out to [facility] See below for details  Subjective: Patient was seen and examined this morning.   Propped up in bed.  Not in distress.  On 3 L oxygen by nasal cannula.  Heart rate remains controlled.   Patient had a fever of 102.8 last night.  Chest x-ray showed bibasilar haziness which could be due to atelectasis, pneumonitis or edema.   Speech therapy obtained this morning.  Reviewed the note from speech therapy this morning.  Patient had an episode of aspiration.  MBS planned.  Assessment and plan: Sepsis secondary to RSV pneumonia  Aspiration pneumonitis Acute hypoxic respiratory failure Presented with worsening cough, shortness of breath, lactate level on admission was elevated to 5.8. CT chest suggested possible bibasilar  infiltrates. She was started on supportive care for RSV. In the last 48 hours, patient had 2 episodes of fever up to 100.3.  She has congested cough as well.  Chest x-ray 1/1 with bibasilar haziness. She is on IV Unasyn to cover for possible aspiration pneumonia.  Speech therapy evaluation obtained.  MBS planned for this afternoon. Continue supportive care for RSV. WBC count normal, procalcitonin level normal.  Lactic acid level was initially elevated which then normalized within few hours On 3 L oxygen by nasal cannula today.  Continue to monitor. Recent Labs  Lab 09/05/22 1815 09/05/22 2040 09/06/22 0125 09/06/22 0535 09/07/22 0052 09/08/22 0032 09/09/22 0027 09/10/22 0059  WBC 9.3  --  7.9  --  6.1 8.7 6.5 9.6  LATICACIDVEN 1.7 5.8* 1.0 1.2  --   --   --   --   PROCALCITON  --   --   --   --   --   --  <0.10  --     New onset A-fib with RVR  History of mitral prolapse, severe MR s/p repair and annuloplasty Per cardiology, currently on metoprolol 25 mg every 6 hours, digoxin 0.125 mg daily. Currently on IV heparin drip.  Plan to start on oral anticoagulation prior to discharge. Unable to cardiovert because of presence of atrial thrombus.  Large left atrial thrombus Acute splenic infarction CT angio chest obtained on admission showed a large left atrial thrombus probably because of splenic infarct.  Started on heparin drip. Cardiology following. Echo showed normal EF,, previous mitral valve  repair with mild mitral stenosis and mitral regurgitation. Defer to cardiology for the indication and timing of TEE  Hypotension Blood pressure running low in 70s this morning. She is on metoprolol 25 mg every 6 hours scheduled for A-fib.  Defer to cardiology for further treatment versus avoiding beta-blocker because of low blood pressure Unable to give fluid bolus because of poor response status and possibility of edema chest x-ray.  Echocardiogram 12/29 with EF 60 to 65%. Monitor blood  pressure for now.    Astrocytoma s/p resection, obstructive hydrocephalus now with VP shunt Cognitive impairment supportive care, has impaired cognition at baseline.  Chronic immobility Bedbound at nursing home for last few years Has chronic spasticity  Goals of care   Code Status: DNR   Scheduled Meds:  apixaban  5 mg Oral BID   atorvastatin  20 mg Oral Daily   Chlorhexidine Gluconate Cloth  6 each Topical Q0600   clonazepam  0.25 mg Oral TID   digoxin  0.125 mg Oral Daily   escitalopram  20 mg Oral Daily   guaiFENesin  600 mg Oral BID   melatonin  3 mg Oral QHS   metoprolol tartrate  25 mg Oral Q6H   mirabegron ER  25 mg Oral Daily   pantoprazole  40 mg Oral Daily   polyvinyl alcohol  1 drop Both Eyes QID   saccharomyces boulardii  250 mg Oral BID    PRN meds: acetaminophen, docusate sodium, mouth rinse, polyethylene glycol   Infusions:   ampicillin-sulbactam (UNASYN) IV 1.5 g (09/10/22 0427)    Skin assessment:     Nutritional status:  Body mass index is 22.9 kg/m.          Diet:  Diet Order             Diet NPO time specified Except for: Sips with Meds, Ice Chips  Diet effective now                   DVT prophylaxis:  SCDs Start: 09/05/22 2333 apixaban (ELIQUIS) tablet 5 mg   Antimicrobials: None Fluid: None Consultants: Cardiology Family Communication: Sister not at bedside today  Status is: Inpatient  Continue in-hospital care because: Cardiology adjusting medication for rate control.  Blood pressure low.  Pending MBS. Level of care: Telemetry Cardiac   Dispo: The patient is from: Long-term nursing at [facility]              Anticipated d/c is to: Back to [place] [facility] hopefully tomorrow if no recurrence of fever.              Patient currently is not medically stable to d/c.   Difficult to place patient No    Antimicrobials: Anti-infectives (From admission, onward)    Start     Dose/Rate Route Frequency Ordered Stop   09/09/22  1000  doxycycline (VIBRA-TABS) tablet 100 mg  Status:  Discontinued        100 mg Oral Every 12 hours 09/09/22 0731 09/09/22 0813   09/08/22 2200  ampicillin-sulbactam (UNASYN) 1.5 g in sodium chloride 0.9 % 100 mL IVPB        1.5 g 200 mL/hr over 30 Minutes Intravenous Every 8 hours 09/08/22 2113     09/06/22 2100  vancomycin (VANCOREADY) IVPB 500 mg/100 mL  Status:  Discontinued        500 mg 100 mL/hr over 60 Minutes Intravenous Every 24 hours 09/05/22 1847 09/05/22 2355   09/06/22 1000  ceFEPIme (  MAXIPIME) 2 g in sodium chloride 0.9 % 100 mL IVPB  Status:  Discontinued        2 g 200 mL/hr over 30 Minutes Intravenous Every 12 hours 09/05/22 1847 09/05/22 2355   09/05/22 1830  ceFEPIme (MAXIPIME) 2 g in sodium chloride 0.9 % 100 mL IVPB        2 g 200 mL/hr over 30 Minutes Intravenous  Once 09/05/22 1816 09/05/22 2000   09/05/22 1830  metroNIDAZOLE (FLAGYL) IVPB 500 mg        500 mg 100 mL/hr over 60 Minutes Intravenous  Once 09/05/22 1816 09/05/22 2103   09/05/22 1830  vancomycin (VANCOCIN) IVPB 1000 mg/200 mL premix        1,000 mg 200 mL/hr over 60 Minutes Intravenous  Once 09/05/22 1816 09/05/22 2103       Objective: Vitals:   09/10/22 0900 09/10/22 0931  BP: (!) 87/52   Pulse: 94 90  Resp: 17   Temp: 97.8 F (36.6 C)   SpO2: 96%     Intake/Output Summary (Last 24 hours) at 09/10/2022 1036 Last data filed at 09/10/2022 0541 Gross per 24 hour  Intake 320 ml  Output --  Net 320 ml   Filed Weights   09/07/22 0018 09/08/22 0542 09/10/22 0113  Weight: 47.6 kg 45.5 kg 48 kg   Weight change:  Body mass index is 22.9 kg/m.   Physical Exam: General exam: Elderly Caucasian female.  Not in pain Skin: No rashes, lesions or ulcers. HEENT: Atraumatic, normocephalic, no obvious bleeding Lungs: Diminished air entry in both bases.  Mild crackles bilaterally CVS: A-fib, rate controlled. GI/Abd soft, nontender, nondistended, bowel sound present CNS: Alert, awake, slow to  respond.  At baseline, unable to have a conversation Psychiatry: Sad affect Extremities: No pedal edema, no calf tenderness  Data Review: I have personally reviewed the laboratory data and studies available.  F/u labs ordered Unresulted Labs (From admission, onward)     Start     Ordered   09/11/22 1308  Basic metabolic panel  Tomorrow morning,   R       Question:  Specimen collection method  Answer:  Lab=Lab collect   09/10/22 0848   09/11/22 0500  Digoxin level  Tomorrow morning,   R       Question:  Specimen collection method  Answer:  Lab=Lab collect   09/10/22 0848   09/09/22 0620  Blood gas, arterial  ONCE - STAT,   STAT       Question:  Room air or oxygen  Answer:  Oxygen   09/09/22 0619   09/06/22 0500  CBC  Daily,   R     See Hyperspace for full Linked Orders Report.   09/05/22 2104            Total time spent in review of labs and imaging, patient evaluation, formulation of plan, documentation and communication with family 72 minutes  Signed, Terrilee Croak, MD [facility] 09/10/2022

## 2022-09-10 NOTE — Progress Notes (Signed)
Progress Note  Patient Name: Michaela [name] Date of Encounter: 09/10/2022  Primary Cardiologist:   None   Subjective   Denies pain.  Breathing not at baseline.   Inpatient Medications    Scheduled Meds:  apixaban  5 mg Oral BID   atorvastatin  20 mg Oral Daily   Chlorhexidine Gluconate Cloth  6 each Topical Q0600   clonazepam  0.25 mg Oral TID   digoxin  0.125 mg Oral Daily   escitalopram  20 mg Oral Daily   guaiFENesin  600 mg Oral BID   melatonin  3 mg Oral QHS   metoprolol tartrate  25 mg Oral Q6H   mirabegron ER  25 mg Oral Daily   pantoprazole  40 mg Oral Daily   polyvinyl alcohol  1 drop Both Eyes QID   saccharomyces boulardii  250 mg Oral BID   Continuous Infusions:  ampicillin-sulbactam (UNASYN) IV 1.5 g (09/10/22 0427)   PRN Meds: acetaminophen, docusate sodium, mouth rinse, polyethylene glycol   Vital Signs    Vitals:   09/10/22 0400 09/10/22 0815 09/10/22 0831 09/10/22 0900  BP: 113/74 (!) 79/53 (!) 79/53 (!) 87/52  Pulse:  100 74 94  Resp: '11 14  17  '$ Temp:  (!) 97.1 F (36.2 C)  97.8 F (36.6 C)  TempSrc:  Oral  Oral  SpO2: 96% 96%  96%  Weight:      Height:        Intake/Output Summary (Last 24 hours) at 09/10/2022 0909 Last data filed at 09/10/2022 0541 Gross per 24 hour  Intake 320 ml  Output --  Net 320 ml   Filed Weights   09/07/22 0018 09/08/22 0542 09/10/22 0113  Weight: 47.6 kg 45.5 kg 48 kg    Telemetry    Atrial fib with controlled ventricular rate.  Rare PVCs.  - Personally Reviewed  ECG    NA - Personally Reviewed  Physical Exam   GEN: No acute distress.  Frail appearing Neck: No  JVD Cardiac: Irregular RR, no murmurs, rubs, or gallops.  Respiratory: Decreased breath sounds bilaterally GI: Soft, nontender, non-distended  MS: No  edema; No deformity. Neuro:  Nonfocal  Psych: Normal affect   Labs    Chemistry Recent Labs  Lab 09/05/22 1815 09/05/22 1825 09/05/22 1826 09/06/22 0125 09/07/22 0052  NA  136 136 136 136 137  K 3.7 3.6 3.7 3.6 3.6  CL 102 101  --  105 102  CO2 24  --   --  23 26  GLUCOSE 89 84  --  93 82  BUN 19 19  --  15 14  CREATININE 0.65 0.60  --  0.49 0.55  CALCIUM 8.0*  --   --  7.6* 8.1*  PROT 5.5*  --   --   --   --   ALBUMIN 2.7*  --   --   --   --   AST 31  --   --   --   --   ALT 28  --   --   --   --   ALKPHOS 64  --   --   --   --   BILITOT 0.4  --   --   --   --   GFRNONAA >60  --   --  >60 >60  ANIONGAP 10  --   --  8 9     Hematology Recent Labs  Lab 09/08/22 0032 09/09/22 0027 09/10/22 0059  WBC 8.7 6.5 9.6  RBC 3.79* 3.38* 3.46*  HGB 11.0* 9.9* 10.2*  HCT 35.1* 30.6* 31.7*  MCV 92.6 90.5 91.6  MCH 29.0 29.3 29.5  MCHC 31.3 32.4 32.2  RDW 13.3 13.3 13.4  PLT 230 194 185    Cardiac EnzymesNo results for input(s): "TROPONINI" in the last 168 hours. No results for input(s): "TROPIPOC" in the last 168 hours.   BNP Recent Labs  Lab 09/05/22 1815  BNP 706.4*     DDimer No results for input(s): "DDIMER" in the last 168 hours.   Radiology    DG CHEST PORT 1 VIEW  Result Date: 09/09/2022 CLINICAL DATA:  200808 with hypoxia. EXAM: PORTABLE CHEST 1 VIEW COMPARISON:  Portable chest 09/05/2022 FINDINGS: 06:46 a.m. Superimposed VP shunt tubing is again noted. There is a mitral valve ring prosthesis. Mild cardiomegaly with aortic atherosclerosis and stable mediastinum. Central vascular prominence continues to be seen with mild basal interstitial edema and small pleural effusions. The lungs are expiratory. There is increased bibasilar haziness which could be due to atelectasis, pneumonitis or ground-glass edema. The mid to upper lung fields are clear of focal opacities. In all other respects there are no further changes. IMPRESSION: 1. Expiratory chest with increased bibasilar haziness which could be due to atelectasis, pneumonitis or ground-glass edema. 2. Mild cardiomegaly with central vascular prominence and mild basal interstitial edema.  Electronically Signed   By: Telford Nab M.D.   On: 09/09/2022 07:09    Cardiac Studies   Echo 09/06/22   1. Left ventricular ejection fraction, by estimation, is 60 to 65%. The  left ventricle has normal function. The left ventricle has no regional  wall motion abnormalities. Left ventricular diastolic function could not  be evaluated.   2. Right ventricular systolic function is normal. The right ventricular  size is normal. There is normal pulmonary artery systolic pressure.   3. Left atrial size was mildly dilated.   4. The mitral valve is degenerative. Mild mitral valve regurgitation.  Mild mitral stenosis. The mean mitral valve gradient is 7.5 mmHg with  average heart rate of 120 bpm. There is a 36 prosthetic prosthetic  annuloplasty ring present in the mitral  position.   5. The aortic valve is tricuspid. Aortic valve regurgitation is not  visualized. No aortic stenosis is present.   6. The inferior vena cava is normal in size with greater than 50%  respiratory variability, suggesting right atrial pressure of 3 mmHg.   Patient Profile     67 y.o. female with history of mitral valve prolapse status post complex mitral valve repair in 2013 (Alfieri edge-to-edge repair with Gore-Tex cords, 36 mm annuloplasty ring), childhood astrocytoma status post radiation with surgical resection complicated by obstructive hydrocephalus with VP shunt who was admitted on 09/05/2022 for acute hypoxic respiratory failure secondary to RSV and pneumonia.  Cardiology was consulted for new onset A-fib with RVR and left atrial appendage thrombus and splenic infarct.  Echocardiogram shows ejection fraction 60 to 65%, mild left atrial enlargement, status post mitral valve repair with mild mitral stenosis, mild mitral regurgitation.     Assessment & Plan    Persistent atrial fibrillation:   Atrial thrombus on CT.  No cardioversion without adequate anticoagulation followed by TEE.  Rate control for now.    Rate is OK.  On Eliquis.  I am going to stop the digoxin given the narrow therapeutic window.   Her BP is low but she did tolerate 37.5 mg tid of metoprolol.  I will further dose adjust and consolidate meds prior to discharge.   MVR:    Mild prosthetic valve regurgitation and mild stenosis.  No change in therapy.    RSV pneumonia:  Supportive care per primary team.     For questions or updates, please contact [place] Please consult [email or web address] for contact info under Cardiology/STEMI.   Signed, Minus Breeding, MD  09/10/2022, 9:09 AM

## 2022-09-10 NOTE — Care Management Important Message (Signed)
Important Message  Patient Details  Name: Michaela [name] MRN: [id number] Date of Birth: 1956-03-06   Medicare Important Message Given:  Yes     Shelda Altes 09/10/2022, 10:59 AM

## 2022-09-11 ENCOUNTER — Inpatient Hospital Stay (HOSPITAL_COMMUNITY): Payer: Medicare Other

## 2022-09-11 DIAGNOSIS — I4891 Unspecified atrial fibrillation: Secondary | ICD-10-CM | POA: Diagnosis not present

## 2022-09-11 LAB — URINE CULTURE: Culture: 400 — AB

## 2022-09-11 LAB — BASIC METABOLIC PANEL
Anion gap: 9 (ref 5–15)
BUN: 13 mg/dL (ref 8–23)
CO2: 27 mmol/L (ref 22–32)
Calcium: 7.8 mg/dL — ABNORMAL LOW (ref 8.9–10.3)
Chloride: 102 mmol/L (ref 98–111)
Creatinine, Ser: 0.63 mg/dL (ref 0.44–1.00)
GFR, Estimated: >60 mL/min (ref >60)
Glucose, Bld: 81 mg/dL (ref 70–99)
Potassium: 3.6 mmol/L (ref 3.5–5.1)
Sodium: 138 mmol/L (ref 135–145)

## 2022-09-11 LAB — DIGOXIN LEVEL: Digoxin Level: 1.1 ng/mL (ref 0.8–2.0)

## 2022-09-11 LAB — CBC
HCT: 31.9 % — ABNORMAL LOW (ref 36.0–46.0)
Hemoglobin: 10.2 g/dL — ABNORMAL LOW (ref 12.0–15.0)
MCH: 29.5 pg (ref 26.0–34.0)
MCHC: 32 g/dL (ref 30.0–36.0)
MCV: 92.2 fL (ref 80.0–100.0)
Platelets: 199 10*3/uL (ref 150–400)
RBC: 3.46 MIL/uL — ABNORMAL LOW (ref 3.87–5.11)
RDW: 13.5 % (ref 11.5–15.5)
WBC: 8.8 10*3/uL (ref 4.0–10.5)
nRBC: 0 % (ref 0.0–0.2)

## 2022-09-11 MED ORDER — GUAIFENESIN ER 600 MG PO TB12
1200.0000 mg | ORAL_TABLET | Freq: Two times a day (BID) | ORAL | Status: DC
  Administered 2022-09-11 – 2022-09-13 (×4): 1200 mg via ORAL
  Filled 2022-09-11 (×4): qty 2

## 2022-09-11 NOTE — Progress Notes (Signed)
Progress Note  Patient Name: Michaela [name] Date of Encounter: 09/11/2022  Primary Cardiologist:   None   Subjective   Denies pain or SOB.    Inpatient Medications    Scheduled Meds:  apixaban  5 mg Oral BID   atorvastatin  20 mg Oral Daily   Chlorhexidine Gluconate Cloth  6 each Topical Q0600   clonazepam  0.25 mg Oral TID   escitalopram  20 mg Oral Daily   guaiFENesin  600 mg Oral BID   melatonin  3 mg Oral QHS   metoprolol tartrate  25 mg Oral Q6H   mirabegron ER  25 mg Oral Daily   pantoprazole  40 mg Oral Daily   polyvinyl alcohol  1 drop Both Eyes QID   saccharomyces boulardii  250 mg Oral BID   Continuous Infusions:  ampicillin-sulbactam (UNASYN) IV 1.5 g (09/11/22 0539)   PRN Meds: acetaminophen, docusate sodium, mouth rinse, polyethylene glycol   Vital Signs    Vitals:   09/10/22 1223 09/10/22 1749 09/10/22 2020 09/11/22 0503  BP: (!) 84/50 (!) 83/58 (!) 146/126 (!) 93/52  Pulse: 77 (!) 116 (!) 49 (!) 54  Resp: '15  17 14  '$ Temp: 97.9 F (36.6 C)  98.2 F (36.8 C) 98 F (36.7 C)  TempSrc: Oral  Oral Oral  SpO2: 98%  100% 99%  Weight:    49.2 kg  Height:       No intake or output data in the 24 hours ending 09/11/22 1014  Filed Weights   09/08/22 0542 09/10/22 0113 09/11/22 0503  Weight: 45.5 kg 48 kg 49.2 kg    Telemetry    Atrial fib with rapid rate  - Personally Reviewed  ECG    NA - Personally Reviewed  Physical Exam   GEN: No  acute distress.   Neck: No  JVD Cardiac: Irregular RR, no murmurs, rubs, or gallops.  Respiratory:    Few basilar crackles GI: Soft, nontender, non-distended, normal bowel sounds  MS:  No edema; No deformity. Neuro:   Nonfocal  Psych: Oriented and appropriate   Labs    Chemistry Recent Labs  Lab 09/05/22 1815 09/05/22 1825 09/06/22 0125 09/07/22 0052 09/11/22 0049  NA 136   < > 136 137 138  K 3.7   < > 3.6 3.6 3.6  CL 102   < > 105 102 102  CO2 24  --  '23 26 27  '$ GLUCOSE 89   < > 93 82  81  BUN 19   < > '15 14 13  '$ CREATININE 0.65   < > 0.49 0.55 0.63  CALCIUM 8.0*  --  7.6* 8.1* 7.8*  PROT 5.5*  --   --   --   --   ALBUMIN 2.7*  --   --   --   --   AST 31  --   --   --   --   ALT 28  --   --   --   --   ALKPHOS 64  --   --   --   --   BILITOT 0.4  --   --   --   --   GFRNONAA >60  --  >60 >60 >60  ANIONGAP 10  --  '8 9 9   '$ < > = values in this interval not displayed.     Hematology Recent Labs  Lab 09/09/22 0027 09/10/22 0059 09/11/22 0049  WBC 6.5 9.6 8.8  RBC 3.38* 3.46* 3.46*  HGB 9.9* 10.2* 10.2*  HCT 30.6* 31.7* 31.9*  MCV 90.5 91.6 92.2  MCH 29.3 29.5 29.5  MCHC 32.4 32.2 32.0  RDW 13.3 13.4 13.5  PLT 194 185 199    Cardiac EnzymesNo results for input(s): "TROPONINI" in the last 168 hours. No results for input(s): "TROPIPOC" in the last 168 hours.   BNP Recent Labs  Lab 09/05/22 1815  BNP 706.4*     DDimer No results for input(s): "DDIMER" in the last 168 hours.   Radiology    No results found.  Cardiac Studies   Echo 09/06/22   1. Left ventricular ejection fraction, by estimation, is 60 to 65%. The  left ventricle has normal function. The left ventricle has no regional  wall motion abnormalities. Left ventricular diastolic function could not  be evaluated.   2. Right ventricular systolic function is normal. The right ventricular  size is normal. There is normal pulmonary artery systolic pressure.   3. Left atrial size was mildly dilated.   4. The mitral valve is degenerative. Mild mitral valve regurgitation.  Mild mitral stenosis. The mean mitral valve gradient is 7.5 mmHg with  average heart rate of 120 bpm. There is a 36 prosthetic prosthetic  annuloplasty ring present in the mitral  position.   5. The aortic valve is tricuspid. Aortic valve regurgitation is not  visualized. No aortic stenosis is present.   6. The inferior vena cava is normal in size with greater than 50%  respiratory variability, suggesting right atrial  pressure of 3 mmHg.   Patient Profile     67 y.o. female with history of mitral valve prolapse status post complex mitral valve repair in 2013 (Alfieri edge-to-edge repair with Gore-Tex cords, 36 mm annuloplasty ring), childhood astrocytoma status post radiation with surgical resection complicated by obstructive hydrocephalus with VP shunt who was admitted on 09/05/2022 for acute hypoxic respiratory failure secondary to RSV and pneumonia.  Cardiology was consulted for new onset A-fib with RVR and left atrial appendage thrombus and splenic infarct.  Echocardiogram shows ejection fraction 60 to 65%, mild left atrial enlargement, status post mitral valve repair with mild mitral stenosis, mild mitral regurgitation.     Assessment & Plan    Persistent atrial fibrillation:   Atrial thrombus on CT.  No cardioversion without adequate anticoagulation followed by TEE.   Rate  is relatively controlled on beta blocker.  Patient has tolerated current dose.  Will consolidate to once daily prior to discharge.    MVR:    Mild prosthetic valve regurgitation and mild stenosis on echo as above.   No change in therapy.    RSV pneumonia:  Supportive care per primary team.     For questions or updates, please contact [place] Please consult [email or web address] for contact info under Cardiology/STEMI.   Signed, Minus Breeding, MD  09/11/2022, 10:14 AM

## 2022-09-11 NOTE — Progress Notes (Signed)
Speech Language Pathology Treatment: Dysphagia  Patient Details Name: Michaela [name] MRN: [id number] DOB: 1955-12-11 Today's Date: 09/11/2022 Time: 9774-1423 SLP Time Calculation (min) (ACUTE ONLY): 24 min  Assessment / Plan / Recommendation Clinical Impression  Pt alert; sister at bedside. She continues with baseline congested cough that persists intermittently when taking POs (thin, nectar, ice chips, applesauce). Michaela [name] followed commands well; she reports no hx of dysphagia and her sister confirms she had no diet restrictions PTA. Due to inconsistency of cough, it is difficult to discern if there is any relationship to swallowing.  Oral suctioning was generally ineffective in reaching any secretions.  Recommend proceeding with MBS this morning to assess swallow physiology. Discussed at length with pt, sister, and RN.  Allow meds in applesauce pending study.    HPI HPI: 67 yo female adm to [facility] with cough and increased dyspnea over night- found to have RSV and a clot.  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  During imaging, she was found to have RSV and Left atrial appendage clot - was transferred out of ICU - she is on nasal cannula oxygen.  Pt is now on anticoagulant per cardiology for her clot.  Swallow eval ordered.  Pt admits to issues with swallowing, sensing food retention in esophagus and also high in the throat *based SLP pointing to isolate area of retention. She denies having lost weight.  Is unable to feed herself and resides at [facility].  Brain imaging has shown atrophy of brainstem and cerebellar area.      SLP Plan  MBS      Recommendations for follow up therapy are one component of a multi-disciplinary discharge planning process, led by the attending physician.  Recommendations may be updated based on patient status, additional functional  criteria and insurance authorization.    Recommendations  Diet recommendations: Other(comment) (meds in puree)                Oral Care Recommendations: Oral care BID Follow Up Recommendations: Follow physician's recommendations for discharge plan and follow up therapies Assistance recommended at discharge: Frequent or constant Supervision/Assistance SLP Visit Diagnosis: Dysphagia, unspecified (R13.10) Plan: MBS         Michaela [name] L. Tivis Ringer, MA CCC/SLP Clinical Specialist - Acute Care SLP Acute Rehabilitation Services Office number [phone]   Michaela [name]  09/11/2022, 9:41 AM

## 2022-09-11 NOTE — Progress Notes (Signed)
Modified Barium Swallow Progress Note  Patient Details  Name: Michaela [name] MRN: [id number] Date of Birth: 11/20/1955  Today's Date: 09/11/2022  Modified Barium Swallow completed.  Full report located under Chart Review in the Imaging Section.  Brief recommendations include the following:  Clinical Impression  Pt presents with a strong, healthy oropharyngeal swallow mechanism.  Oral phase is functional and pt was able to manipulate regular solids with good oral control, no residue post-swallow.  The pharyngeal swallow was triggered at the level of the pyriforms when drinking thin liquids; it triggered at the valleculae for purees and regular solids.  There was reliable pharyngeal stripping with no pharyngeal residue post-swallow. Laryngeal vestibule closure was consistent and timely - there was no penetration of the larynx, no aspiration, despite system being taxed by sequential and mixed boluses. Pt coughed occasionally during test - cough was NOT ASSOCIATED with airway intrusion/aspiration. Recommend resuming a regular diet with thin liquids to maximize available choices. Pt was able to swallow a 13 mm barium pill with thin liquids without difficulty.  SLP will follow x1 more for education.  Pt's sister, Michaela [name], was present for study; results and recs were reviewed. Pt and sister agree with plan.   Swallow Evaluation Recommendations       SLP Diet Recommendations: Regular solids;Thin liquid   Liquid Administration via: Cup;Straw   Medication Administration: Whole meds with liquid   Supervision: Full assist for feeding;Staff to assist with self feeding           Oral Care Recommendations: Oral care BID      [name] L. Tivis Ringer, MA CCC/SLP Clinical Specialist - Acute Care SLP Acute Rehabilitation Services Office number [phone]   Juan Quam Laurice 09/11/2022,10:44 AM

## 2022-09-11 NOTE — Plan of Care (Signed)

## 2022-09-11 NOTE — Plan of Care (Signed)
  Problem: Clinical Measurements: Goal: Ability to maintain clinical measurements within normal limits will improve Outcome: Progressing Goal: Will remain free from infection Outcome: Progressing Goal: Diagnostic test results will improve Outcome: Progressing Goal: Cardiovascular complication will be avoided Outcome: Progressing   Problem: Coping: Goal: Level of anxiety will decrease Outcome: Progressing   Problem: Elimination: Goal: Will not experience complications related to bowel motility Outcome: Progressing Goal: Will not experience complications related to urinary retention Outcome: Progressing   Problem: Pain Managment: Goal: General experience of comfort will improve Outcome: Progressing   Problem: Education: Goal: Knowledge of General Education information will improve Description: Including pain rating scale, medication(s)/side effects and non-pharmacologic comfort measures Outcome: Not Progressing   Problem: Health Behavior/Discharge Planning: Goal: Ability to manage health-related needs will improve Outcome: Not Progressing   Problem: Clinical Measurements: Goal: Respiratory complications will improve Outcome: Not Progressing   Problem: Activity: Goal: Risk for activity intolerance will decrease Outcome: Not Progressing   Problem: Nutrition: Goal: Adequate nutrition will be maintained Outcome: Not Progressing

## 2022-09-11 NOTE — Progress Notes (Signed)
PROGRESS NOTE  Michaela [name]  DOB: 1956/07/03  PCP: Javier Glazier, MD WNI:[id number]  DOA: 09/05/2022  LOS: 6 days  Hospital Day: 7  Brief narrative: Michaela [name] is a 67 y.o. female with PMH significant for mitral valve prolapse and severe MR s/p repair and annuloplasty, history of astrocytoma s/p radiation, and surgical resection, complicated with obstructive hydrocephalus, now with VP shunt.  She has been living at [place] [facility] long-term care for last 3 years, wheelchair bound at baseline. Patient apparently was having cough, shortness of breath a few days.  Labs showed elevated WBC count and she was started on Bactrim but her symptoms did not improve and hence she was sent to the ED on 12/28.  In the ED, patient was noted to be in A-fib with RVR with heart rate up to 150s, O2 sat was low Lactic acid level was elevated to 5.8 Respiratory virus panel was positive for RSV CTA chest did not show any evidence of pulm embolism.  But it showed left atrial enlargement with left atrial appendage clot and splenic infarct.  It also showed possible bibasilar infiltrates Patient was admitted to ICU Cardiology was consulted as well 12/30, transferred out to [facility] See below for details  Subjective: Patient was seen and examined this morning.   Propped up in bed.  On 3 L oxygen by nasal cannula.  Has wet cough this morning.  Sister at bedside. No fever in last 24 hours MBS planned this morning.  Assessment and plan: Sepsis secondary to RSV pneumonia  Aspiration pneumonitis Acute hypoxic respiratory failure Presented with worsening cough, shortness of breath, lactate level on admission was elevated to 5.8. CT chest suggested possible bibasilar infiltrates. She was started on supportive care for RSV. 12/31 and 1/1, patient had 2 episodes of fever up to 102.3.  She ha had s congested cough as well.  Chest x-ray 1/1 with bibasilar haziness. She is on IV Unasyn to cover for possible  aspiration pneumonia.  Speech therapy evaluation obtained.  MBS planned today. No fever in last 24 hours. WBC count normal, procalcitonin level normal.  Lactic acid level was initially elevated which then normalized within few hours On 3 L oxygen by nasal cannula today.  Continue to monitor. Continue supportive care for RSV. Recent Labs  Lab 09/05/22 1815 09/05/22 2040 09/06/22 0125 09/06/22 0535 09/07/22 0052 09/08/22 0032 09/09/22 0027 09/10/22 0059 09/11/22 0049  WBC 9.3  --  7.9  --  6.1 8.7 6.5 9.6 8.8  LATICACIDVEN 1.7 5.8* 1.0 1.2  --   --   --   --   --   PROCALCITON  --   --   --   --   --   --  <0.10  --   --      New onset A-fib with RVR  History of mitral prolapse, severe MR s/p repair and annuloplasty Per cardiology, currently on metoprolol 25 mg every 6 hours.  Digoxin has been stopped.. Initially started on on IV heparin drip.  Subsequently switched to oral Eliquis.   Unable to cardiovert because of presence of atrial thrombus.  Large left atrial thrombus Acute splenic infarction CT angio chest obtained on admission showed a large left atrial thrombus probably because of splenic infarct.  Started on heparin drip. Cardiology following. Echo showed normal EF,, previous mitral valve repair with mild mitral stenosis and mitral regurgitation. Defer to cardiology for the indication and timing of TEE  Hypotension Blood pressure probably low at  baseline because of thin built.  She weighs less than 50 kg. She is on metoprolol 25 mg every 6 hours scheduled for A-fib.  Defer to cardiology for further treatment versus avoiding beta-blocker because of low blood pressure Unable to give fluid bolus because of poor response status and possibility of edema chest x-ray.  Echocardiogram 12/29 with EF 60 to 65%. Blood pressure seems to be uptrending today.  27C systolic is acceptable.  Monitor blood pressure for now.    Astrocytoma s/p resection, obstructive hydrocephalus now with  VP shunt Cognitive impairment supportive care, has impaired cognition at baseline.  Chronic immobility Bedbound at nursing home for last few years Has chronic spasticity  Goals of care   Code Status: DNR   Scheduled Meds:  apixaban  5 mg Oral BID   atorvastatin  20 mg Oral Daily   Chlorhexidine Gluconate Cloth  6 each Topical Q0600   clonazepam  0.25 mg Oral TID   escitalopram  20 mg Oral Daily   guaiFENesin  600 mg Oral BID   melatonin  3 mg Oral QHS   metoprolol tartrate  25 mg Oral Q6H   mirabegron ER  25 mg Oral Daily   pantoprazole  40 mg Oral Daily   polyvinyl alcohol  1 drop Both Eyes QID   saccharomyces boulardii  250 mg Oral BID    PRN meds: acetaminophen, docusate sodium, mouth rinse, polyethylene glycol   Infusions:   ampicillin-sulbactam (UNASYN) IV 1.5 g (09/11/22 0539)    Skin assessment:     Nutritional status:  Body mass index is 23.47 kg/m.          Diet:  Diet Order             Diet NPO time specified Except for: Sips with Meds, Ice Chips  Diet effective now                   DVT prophylaxis:  SCDs Start: 09/05/22 2333 apixaban (ELIQUIS) tablet 5 mg   Antimicrobials: None Fluid: None Consultants: Cardiology Family Communication: Sister  at bedside today  Status is: Inpatient  Continue in-hospital care because: Cardiology adjusting medication for rate control. Pending MBS. Level of care: Telemetry Cardiac   Dispo: The patient is from: Long-term nursing at [facility] burn              Anticipated d/c is to: Back to [place] [facility] hopefully in 1 to 2 days.              Patient currently is not medically stable to d/c.   Difficult to place patient No    Antimicrobials: Anti-infectives (From admission, onward)    Start     Dose/Rate Route Frequency Ordered Stop   09/09/22 1000  doxycycline (VIBRA-TABS) tablet 100 mg  Status:  Discontinued        100 mg Oral Every 12 hours 09/09/22 0731 09/09/22 0813   09/08/22 2200   ampicillin-sulbactam (UNASYN) 1.5 g in sodium chloride 0.9 % 100 mL IVPB        1.5 g 200 mL/hr over 30 Minutes Intravenous Every 8 hours 09/08/22 2113     09/06/22 2100  vancomycin (VANCOREADY) IVPB 500 mg/100 mL  Status:  Discontinued        500 mg 100 mL/hr over 60 Minutes Intravenous Every 24 hours 09/05/22 1847 09/05/22 2355   09/06/22 1000  ceFEPIme (MAXIPIME) 2 g in sodium chloride 0.9 % 100 mL IVPB  Status:  Discontinued  2 g 200 mL/hr over 30 Minutes Intravenous Every 12 hours 09/05/22 1847 09/05/22 2355   09/05/22 1830  ceFEPIme (MAXIPIME) 2 g in sodium chloride 0.9 % 100 mL IVPB        2 g 200 mL/hr over 30 Minutes Intravenous  Once 09/05/22 1816 09/05/22 2000   09/05/22 1830  metroNIDAZOLE (FLAGYL) IVPB 500 mg        500 mg 100 mL/hr over 60 Minutes Intravenous  Once 09/05/22 1816 09/05/22 2103   09/05/22 1830  vancomycin (VANCOCIN) IVPB 1000 mg/200 mL premix        1,000 mg 200 mL/hr over 60 Minutes Intravenous  Once 09/05/22 1816 09/05/22 2103       Objective: Vitals:   09/10/22 2020 09/11/22 0503  BP: (!) 146/126 (!) 93/52  Pulse: (!) 49 (!) 54  Resp: 17 14  Temp: 98.2 F (36.8 C) 98 F (36.7 C)  SpO2: 100% 99%   No intake or output data in the 24 hours ending 09/11/22 1036  Filed Weights   09/08/22 0542 09/10/22 0113 09/11/22 0503  Weight: 45.5 kg 48 kg 49.2 kg   Weight change: 1.2 kg Body mass index is 23.47 kg/m.   Physical Exam: General exam: Elderly Caucasian female.  Not in pain Skin: No rashes, lesions or ulcers. HEENT: Atraumatic, normocephalic, no obvious bleeding Lungs: Diminished air entry in both bases.  Clear to auscultation bilaterally.  Intermittent congested cough. CVS: A-fib, rate controlled. GI/Abd soft, nontender, nondistended, bowel sound present CNS: Alert, awake, slow to respond.  At baseline, unable to have a conversation Psychiatry: Sad affect Extremities: No pedal edema, no calf tenderness  Data Review: I have  personally reviewed the laboratory data and studies available.  F/u labs ordered Unresulted Labs (From admission, onward)     Start     Ordered   09/09/22 0620  Blood gas, arterial  ONCE - STAT,   STAT       Question:  Room air or oxygen  Answer:  Oxygen   09/09/22 0619   09/06/22 0500  CBC  Daily,   R     See Hyperspace for full Linked Orders Report.   09/05/22 2104            Total time spent in review of labs and imaging, patient evaluation, formulation of plan, documentation and communication with family 76 minutes  Signed, Terrilee Croak, MD [facility] 09/11/2022

## 2022-09-11 NOTE — TOC Progression Note (Signed)
Transition of Care [facility]) - Progression Note    Patient Details  Name: Michaela [name] MRN: [id number] Date of Birth: Feb 26, 1956  Transition of Care [facility]) CM/SW [place], [place] Phone Number: 09/11/2022, 12:39 PM  Clinical Narrative:     Pt's sister [name] called CSW wanting to clarify that required documents were sent to SNF. CSW confirmed he sent initial referral yesterday to [email or web address]. Sister requested a copy of the referral packet that was sent to [facility]. CSW explained she would need to contact Medical Records department to make a medical records request. CSW provided their phone number. Sister is Patent attorney.   CSW called Cobi ([phone]) with [facility] and provided her with general update. TOC will continue to follow to assist with DC back to SNF when medically ready.    Expected Discharge Plan: [place] Barriers to Discharge: Continued Medical Work up  Expected Discharge Plan and [place] arrangements for the past 2 months: [place]                                       Social Determinants of Health (SDOH) Interventions SDOH Screenings   Food Insecurity: No Food Insecurity (01/22/2018)  Transportation Needs: No Transportation Needs (01/22/2018)  Depression (PHQ2-9): Low Risk  (03/19/2019)  Financial Resource Strain: Low Risk  (01/22/2018)  Physical Activity: Inactive (01/22/2018)  Social Connections: Moderately Isolated (01/22/2018)  Stress: No Stress Concern Present (01/22/2018)  Tobacco Use: Medium Risk (09/05/2022)    Readmission Risk Interventions     No data to display

## 2022-09-12 DIAGNOSIS — I4891 Unspecified atrial fibrillation: Secondary | ICD-10-CM | POA: Diagnosis not present

## 2022-09-12 LAB — CBC
HCT: 31.4 % — ABNORMAL LOW (ref 36.0–46.0)
Hemoglobin: 10 g/dL — ABNORMAL LOW (ref 12.0–15.0)
MCH: 28.9 pg (ref 26.0–34.0)
MCHC: 31.8 g/dL (ref 30.0–36.0)
MCV: 90.8 fL (ref 80.0–100.0)
Platelets: 206 10*3/uL (ref 150–400)
RBC: 3.46 MIL/uL — ABNORMAL LOW (ref 3.87–5.11)
RDW: 13.4 % (ref 11.5–15.5)
WBC: 8.9 10*3/uL (ref 4.0–10.5)
nRBC: 0 % (ref 0.0–0.2)

## 2022-09-12 MED ORDER — AMOXICILLIN-POT CLAVULANATE 875-125 MG PO TABS
1.0000 | ORAL_TABLET | Freq: Two times a day (BID) | ORAL | Status: DC
  Administered 2022-09-12 – 2022-09-13 (×3): 1 via ORAL
  Filled 2022-09-12 (×3): qty 1

## 2022-09-12 MED ORDER — METOPROLOL TARTRATE 25 MG PO TABS
25.0000 mg | ORAL_TABLET | Freq: Three times a day (TID) | ORAL | Status: DC
  Administered 2022-09-12 – 2022-09-13 (×3): 25 mg via ORAL
  Filled 2022-09-12 (×3): qty 1

## 2022-09-12 MED ORDER — DIGOXIN 125 MCG PO TABS
0.0625 mg | ORAL_TABLET | Freq: Every day | ORAL | Status: DC
  Administered 2022-09-12 – 2022-09-13 (×2): 0.0625 mg via ORAL
  Filled 2022-09-12 (×2): qty 1

## 2022-09-12 MED ORDER — SODIUM CHLORIDE 0.9 % IV BOLUS
500.0000 mL | Freq: Once | INTRAVENOUS | Status: AC
  Administered 2022-09-12: 500 mL via INTRAVENOUS

## 2022-09-12 NOTE — Progress Notes (Addendum)
Progress Note  Patient Name: Michaela [name] Date of Encounter: 09/12/2022  Primary Cardiologist:   None   Subjective   Confused.  No cough. No pain. Her sister is anxious to have her discharged as she thinks this will help with mentation.   Inpatient Medications    Scheduled Meds:  apixaban  5 mg Oral BID   atorvastatin  20 mg Oral Daily   clonazepam  0.25 mg Oral TID   escitalopram  20 mg Oral Daily   guaiFENesin  1,200 mg Oral BID   melatonin  3 mg Oral QHS   metoprolol tartrate  25 mg Oral Q6H   mirabegron ER  25 mg Oral Daily   pantoprazole  40 mg Oral Daily   polyvinyl alcohol  1 drop Both Eyes QID   saccharomyces boulardii  250 mg Oral BID   Continuous Infusions:  ampicillin-sulbactam (UNASYN) IV 1.5 g (09/12/22 0616)   PRN Meds: acetaminophen, docusate sodium, mouth rinse, polyethylene glycol   Vital Signs    Vitals:   09/12/22 0200 09/12/22 0400 09/12/22 0433 09/12/22 0916  BP:   (!) 94/53 103/76  Pulse: 72 72 64 82  Resp: '15 15 17 19  '$ Temp:   (!) 97 F (36.1 C) (!) 97.4 F (36.3 C)  TempSrc:   Axillary Oral  SpO2: 99% 100% 99% 99%  Weight:      Height:        Intake/Output Summary (Last 24 hours) at 09/12/2022 0947 Last data filed at 09/12/2022 0904 Gross per 24 hour  Intake 1020 ml  Output 1050 ml  Net -30 ml    Filed Weights   09/08/22 0542 09/10/22 0113 09/11/22 0503  Weight: 45.5 kg 48 kg 49.2 kg    Telemetry    Atrial fib with rate controlled- Personally Reviewed  ECG    NA - Personally Reviewed  Physical Exam   GEN: No  acute distress.  , frail Neck: No  JVD Cardiac: IrregularRR, no murmurs, rubs, or gallops.  Respiratory: Poor inspiratory effort, decreased breath sounds GI: Soft, nontender, non-distended, normal bowel sounds  MS:  No edema; No deformity. Psych: Oriented to person   Labs    Chemistry Recent Labs  Lab 09/05/22 1815 09/05/22 1825 09/06/22 0125 09/07/22 0052 09/11/22 0049  NA 136   < > 136 137  138  K 3.7   < > 3.6 3.6 3.6  CL 102   < > 105 102 102  CO2 24  --  '23 26 27  '$ GLUCOSE 89   < > 93 82 81  BUN 19   < > '15 14 13  '$ CREATININE 0.65   < > 0.49 0.55 0.63  CALCIUM 8.0*  --  7.6* 8.1* 7.8*  PROT 5.5*  --   --   --   --   ALBUMIN 2.7*  --   --   --   --   AST 31  --   --   --   --   ALT 28  --   --   --   --   ALKPHOS 64  --   --   --   --   BILITOT 0.4  --   --   --   --   GFRNONAA >60  --  >60 >60 >60  ANIONGAP 10  --  '8 9 9   '$ < > = values in this interval not displayed.     Hematology Recent Labs  Lab 09/10/22 0059 09/11/22 0049 09/12/22 0100  WBC 9.6 8.8 8.9  RBC 3.46* 3.46* 3.46*  HGB 10.2* 10.2* 10.0*  HCT 31.7* 31.9* 31.4*  MCV 91.6 92.2 90.8  MCH 29.5 29.5 28.9  MCHC 32.2 32.0 31.8  RDW 13.4 13.5 13.4  PLT 185 199 206    Cardiac EnzymesNo results for input(s): "TROPONINI" in the last 168 hours. No results for input(s): "TROPIPOC" in the last 168 hours.   BNP Recent Labs  Lab 09/05/22 1815  BNP 706.4*     DDimer No results for input(s): "DDIMER" in the last 168 hours.   Radiology    DG Swallowing Func-Speech Pathology  Result Date: 09/11/2022 Table formatting from the original result was not included. Objective Swallowing Evaluation: Type of Study: MBS-Modified Barium Swallow Study  Patient Details Name: FABIOLA MUDGETT MRN: [id number] Date of Birth: 11-30-1955 Today's Date: 09/11/2022 Time: SLP Start Time (ACUTE ONLY): 1007 -SLP Stop Time (ACUTE ONLY): 1031 SLP Time Calculation (min) (ACUTE ONLY): 24 min Past Medical History: Past Medical History: Diagnosis Date  Astrocytoma brain tumor ([place]) 1971  astrocytoma treated with radiation  C. difficile colitis 11/9765  complicated by sepsis/ARDS and prolonged hosp  Depression   Hyperlipidemia   Mitral regurgitation 10/2011  severe, s/p min invasive repair and annuloplasty  Mitral valve prolapse   Neuromuscular disorder (HCC)   numbness in hands and feet  Osteoarthritis   Osteoporosis   DEXA @ [facility] 11/2012:  -3.5, prior tx Reclast, last in 2011, rec change to Prolia  Overactive bladder   Pleural effusion, left 11/11/2011  Vitamin D deficiency  Past Surgical History: Past Surgical History: Procedure Laterality Date  CARDIAC CATHETERIZATION    Jan 2013  KNEE ARTHROSCOPY Right   MITRAL VALVE REPAIR  10/15/2011  Procedure: MINIMALLY INVASIVE MITRAL VALVE REPAIR (MVR);  Surgeon: Rexene Alberts, MD;  Location: [place];  Service: Open Heart Surgery;  Laterality: Right;  TRANSESOPHAGEAL ECHOCARDIOGRAM  7/12  US ECHOCARDIOGRAPHY  6/12  VENTRICULOPERITONEAL SHUNT  1971 HPI: 67 yo female adm to [facility] with cough and increased dyspnea over night- found to have RSV and a clot.  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  During imaging, she was found to have RSV and Left atrial appendage clot - was transferred out of ICU - she is on nasal cannula oxygen.  Pt is now on anticoagulant per cardiology for her clot.  Swallow eval ordered.  Pt admits to issues with swallowing, sensing food retention in esophagus and also high in the throat *based SLP pointing to isolate area of retention. She denies having lost weight.  Is unable to feed herself and resides at [facility].  Brain imaging has shown atrophy of brainstem and cerebellar area.  Subjective: alert and cooperative  Recommendations for follow up therapy are one component of a multi-disciplinary discharge planning process, led by the attending physician.  Recommendations may be updated based on patient status, additional functional criteria and insurance authorization. Assessment / Plan / Recommendation   09/11/2022  10:00 AM Clinical Impressions Clinical Impression Pt presents with a strong, healthy oropharyngeal swallow mechanism.  Oral phase is functional and pt was able to manipulate regular solids with good oral control, no residue post-swallow.  The pharyngeal  swallow was triggered at the level of the pyriforms when drinking thin liquids; it triggered at the valleculae for purees and regular solids.  There was reliable pharyngeal stripping with no pharyngeal residue post-swallow. Laryngeal vestibule closure was consistent and timely - there was no penetration of the larynx, no aspiration, despite system being taxed by sequential and mixed boluses. Pt coughed occasionally during test - cough was NOT ASSOCIATED with airway intrusion/aspiration. Recommend resuming a regular diet with thin liquids to maximize available choices. Pt was able to swallow a 13 mm barium pill with thin liquids without difficulty.  SLP will follow x1 more for education.  Pt's sister, [name], was present for study; results and recs were reviewed. Pt and sister agree with plan. SLP Visit Diagnosis Dysphagia, unspecified (R13.10) Impact on safety and function No limitations     09/11/2022  10:00 AM Treatment Recommendations Treatment Recommendations Therapy as outlined in treatment plan below     09/08/2022  10:32 AM Prognosis Prognosis for Safe Diet Advancement Good   09/11/2022  10:00 AM Diet Recommendations SLP Diet Recommendations Regular solids;Thin liquid Liquid Administration via Cup;Straw Medication Administration Whole meds with liquid     09/11/2022  10:00 AM Other Recommendations Oral Care Recommendations Oral care BID Follow Up Recommendations Follow physician's recommendations for discharge plan and follow up therapies   09/11/2022  10:00 AM Frequency and Duration  Speech Therapy Frequency (ACUTE ONLY) min 1 x/week Treatment Duration 1 week     09/11/2022  10:00 AM Oral Phase Oral Phase [facility]    09/11/2022  10:00 AM Pharyngeal Phase Pharyngeal Phase [facility]    09/11/2022  10:00 AM Cervical Esophageal Phase  Cervical Esophageal Phase [facility]am Laurice 09/11/2022, 10:56 AM    Estill Bamberg L. Tivis Ringer, MA CCC/SLP Clinical Specialist - Acute Care SLP Acute Rehabilitation Services Office number [phone]                    Cardiac Studies   Echo 09/06/22   1. Left ventricular ejection fraction, by estimation, is 60 to 65%. The  left ventricle has normal function. The left ventricle has no regional  wall motion abnormalities. Left ventricular diastolic function could not  be evaluated.   2. Right ventricular systolic function is normal. The right ventricular  size is normal. There is normal pulmonary artery systolic pressure.   3. Left atrial size was mildly dilated.   4. The mitral valve is degenerative. Mild mitral valve regurgitation.  Mild mitral stenosis. The mean mitral valve gradient is 7.5 mmHg with  average heart rate of 120 bpm. There is a 36 prosthetic prosthetic  annuloplasty ring present in the mitral  position.   5. The aortic valve is tricuspid. Aortic valve regurgitation is not  visualized. No aortic stenosis is present.   6. The inferior vena cava is normal in size with greater than 50%  respiratory variability, suggesting right atrial pressure of 3 mmHg.   Patient Profile     67 y.o. female with history of mitral valve prolapse status post complex mitral valve repair in 2013 (Alfieri edge-to-edge repair with Gore-Tex cords, 36 mm annuloplasty ring), childhood astrocytoma status post radiation with surgical resection complicated by obstructive hydrocephalus with VP shunt who was admitted on 09/05/2022 for acute hypoxic respiratory failure secondary to RSV and pneumonia.  Cardiology was consulted for new onset A-fib with RVR and left atrial appendage thrombus and splenic infarct.  Echocardiogram shows ejection fraction 60 to 65%, mild left atrial enlargement, status post mitral valve repair with mild mitral stenosis, mild mitral regurgitation.     Assessment & Plan    Persistent atrial fibrillation:  Atrial thrombus on CT.  No cardioversion without adequate anticoagulation followed by TEE.  We will arrange follow up with plan for TEE/DCCV.  OK to go home on 25 mg bid of metoprolol  with dig as on MAR and Eliquis.  Rate is reasonable.   MVR:    Mild prosthetic valve regurgitation and mild stenosis on echo as above.   No change in therapy.    RSV pneumonia:  Supportive care per primary team.     For questions or updates, please contact [place] Please consult [email or web address] for contact info under Cardiology/STEMI.   Signed, Minus Breeding, MD  09/12/2022, 9:47 AM

## 2022-09-12 NOTE — Progress Notes (Signed)
PROGRESS NOTE  Michaela [name]  DOB: Mar 11, 1956  PCP: Javier Glazier, MD NIO:[id number]  DOA: 09/05/2022  LOS: 7 days  Hospital Day: 8  Brief narrative: Michaela [name] is a 67 y.o. female with PMH significant for mitral valve prolapse and severe MR s/p repair and annuloplasty, history of astrocytoma s/p radiation, and surgical resection, complicated with obstructive hydrocephalus, now with VP shunt.  She has been living at [facility] for last 3 years, wheelchair bound at baseline. Patient apparently was having cough, shortness of breath a few days.  Labs showed elevated WBC count and she was started on Bactrim but her symptoms did not improve and hence she was sent to the ED on 12/28.  In the ED, patient was noted to be in A-fib with RVR with heart rate up to 150s, O2 sat was low Lactic acid level was elevated to 5.8 Respiratory virus panel was positive for RSV CTA chest did not show any evidence of pulm embolism.  But it showed left atrial enlargement with left atrial appendage clot and splenic infarct.  It also showed possible bibasilar infiltrates Patient was admitted to ICU Cardiology was consulted as well 12/30, transferred out to [facility] See below for details  Subjective: Patient was seen and examined this morning.   Propped up in bed.  On 2 L oxygen by nasal collar.  Heart rate 80 to between 90s and 130s. Sister at bedside.  Assessment and plan: Sepsis secondary to RSV pneumonia  Aspiration pneumonitis Acute hypoxic respiratory failure Presented with worsening cough, shortness of breath, lactate level on admission was elevated to 5.8. CT chest suggested possible bibasilar infiltrates. She was started on supportive care for RSV. 12/31 and 1/1, patient had 2 episodes of fever up to 102.3.  She ha had s congested cough as well.  Chest x-ray 1/1 with bibasilar haziness. She is on IV Unasyn to cover for possible aspiration pneumonia.  Speech therapy  evaluation obtained.  MBS planned today. No fever in last 48 hours WBC count normal, procalcitonin level normal.  Lactic acid level was initially elevated which then normalized within few hours.  Switch antibiotics to oral Augmentin. On 3 L oxygen by nasal cannula today.  Continue to monitor. Continue supportive care for RSV. Recent Labs  Lab 09/05/22 1815 09/05/22 2040 09/06/22 0125 09/06/22 0535 09/07/22 0052 09/08/22 0032 09/09/22 0027 09/10/22 0059 09/11/22 0049 09/12/22 0100  WBC 9.3  --  7.9  --    < > 8.7 6.5 9.6 8.8 8.9  LATICACIDVEN 1.7 5.8* 1.0 1.2  --   --   --   --   --   --   PROCALCITON  --   --   --   --   --   --  <0.10  --   --   --    < > = values in this interval not displayed.    New onset A-fib with RVR  History of mitral prolapse, severe MR s/p repair and annuloplasty Cardiology following.  Currently patient is on metoprolol 25 mg every 6 hours.  However she is having to skip doses here and there because of low blood pressure.  I discussed with cardiologist Dr. Percival Spanish this morning.  Will continue metoprolol at a reduced frequency 25 mg every 8 hours.  She has also been resumed on digoxin at half dose Initially started on on IV heparin drip.  Subsequently switched to oral Eliquis.   Unable to cardiovert because of presence  of atrial thrombus.  Large left atrial thrombus Acute splenic infarction CT angio chest obtained on admission showed a large left atrial thrombus probably because of splenic infarct.  Started on heparin drip. Cardiology following. Echo showed normal EF,, previous mitral valve repair with mild mitral stenosis and mitral regurgitation. Defer to cardiology for the indication and timing of TEE  Hypotension Intermittent low blood pressure.  Down to 80s this morning.  Mental status stable.  Hypotension is partly because of rapid heart rate and partly because of metoprolol.  Expect improvement with reduced dose of metoprolol.  Echocardiogram  12/29 with EF 60 to 65%. Continue to monitor blood pressure    Astrocytoma s/p resection, obstructive hydrocephalus now with VP shunt Cognitive impairment supportive care, has impaired cognition at baseline.  Chronic immobility Bedbound at nursing home for last few years Has chronic spasticity  Goals of care   Code Status: DNR   Scheduled Meds:  amoxicillin-clavulanate  1 tablet Oral Q12H   apixaban  5 mg Oral BID   atorvastatin  20 mg Oral Daily   clonazepam  0.25 mg Oral TID   digoxin  0.0625 mg Oral Daily   escitalopram  20 mg Oral Daily   guaiFENesin  1,200 mg Oral BID   melatonin  3 mg Oral QHS   metoprolol tartrate  25 mg Oral Q8H   mirabegron ER  25 mg Oral Daily   pantoprazole  40 mg Oral Daily   polyvinyl alcohol  1 drop Both Eyes QID   saccharomyces boulardii  250 mg Oral BID    PRN meds: acetaminophen, docusate sodium, mouth rinse, polyethylene glycol   Infusions:     Skin assessment:     Nutritional status:  Body mass index is 23.47 kg/m.          Diet:  Diet Order             Diet regular Room service appropriate? Yes with Assist; Fluid consistency: Thin  Diet effective now                   DVT prophylaxis:  SCDs Start: 09/05/22 2333 apixaban (ELIQUIS) tablet 5 mg   Antimicrobials: None Fluid: None Consultants: Cardiology Family Communication: Sister  at bedside today  Status is: Inpatient  Continue in-hospital care because: Unable to discharge today because of further assessment and metoprolol required Level of care: Telemetry Cardiac   Dispo: The patient is from: Long-term nursing at [facility]              Anticipated d/c is to: Back to [place] [facility] hopefully in 1 to 2 days.              Patient currently is not medically stable to d/c.   Difficult to place patient No    Antimicrobials: Anti-infectives (From admission, onward)    Start     Dose/Rate Route Frequency Ordered Stop   09/12/22 1330   amoxicillin-clavulanate (AUGMENTIN) 875-125 MG per tablet 1 tablet        1 tablet Oral Every 12 hours 09/12/22 1242     09/09/22 1000  doxycycline (VIBRA-TABS) tablet 100 mg  Status:  Discontinued        100 mg Oral Every 12 hours 09/09/22 0731 09/09/22 0813   09/08/22 2200  ampicillin-sulbactam (UNASYN) 1.5 g in sodium chloride 0.9 % 100 mL IVPB  Status:  Discontinued        1.5 g 200 mL/hr over 30 Minutes Intravenous Every 8  hours 09/08/22 2113 09/12/22 1242   09/06/22 2100  vancomycin (VANCOREADY) IVPB 500 mg/100 mL  Status:  Discontinued        500 mg 100 mL/hr over 60 Minutes Intravenous Every 24 hours 09/05/22 1847 09/05/22 2355   09/06/22 1000  ceFEPIme (MAXIPIME) 2 g in sodium chloride 0.9 % 100 mL IVPB  Status:  Discontinued        2 g 200 mL/hr over 30 Minutes Intravenous Every 12 hours 09/05/22 1847 09/05/22 2355   09/05/22 1830  ceFEPIme (MAXIPIME) 2 g in sodium chloride 0.9 % 100 mL IVPB        2 g 200 mL/hr over 30 Minutes Intravenous  Once 09/05/22 1816 09/05/22 2000   09/05/22 1830  metroNIDAZOLE (FLAGYL) IVPB 500 mg        500 mg 100 mL/hr over 60 Minutes Intravenous  Once 09/05/22 1816 09/05/22 2103   09/05/22 1830  vancomycin (VANCOCIN) IVPB 1000 mg/200 mL premix        1,000 mg 200 mL/hr over 60 Minutes Intravenous  Once 09/05/22 1816 09/05/22 2103       Objective: Vitals:   09/12/22 1046 09/12/22 1158  BP: 93/73 93/79  Pulse: 96 74  Resp: 17 19  Temp: (!) 97.2 F (36.2 C) (!) 97.2 F (36.2 C)  SpO2: 95% 99%    Intake/Output Summary (Last 24 hours) at 09/12/2022 1245 Last data filed at 09/12/2022 1236 Gross per 24 hour  Intake 1250 ml  Output 1200 ml  Net 50 ml   Filed Weights   09/08/22 0542 09/10/22 0113 09/11/22 0503  Weight: 45.5 kg 48 kg 49.2 kg   Weight change:  Body mass index is 23.47 kg/m.   Physical Exam: General exam: Elderly Caucasian female.  Not in pain Skin: No rashes, lesions or ulcers. HEENT: Atraumatic, normocephalic, no  obvious bleeding Lungs: Diminished air entry in both bases.  Clear to auscultation bilaterally.  Intermittent congested cough. CVS: A-fib with intermittent tachycardia GI/Abd soft, nontender, nondistended, bowel sound present CNS: Alert, awake, slow to respond.  At baseline, unable to have a conversation Psychiatry: Sad affect Extremities: No pedal edema, no calf tenderness  Data Review: I have personally reviewed the laboratory data and studies available.  F/u labs ordered Unresulted Labs (From admission, onward)     Start     Ordered   09/06/22 0500  CBC  Daily,   R     See Hyperspace for full Linked Orders Report.   09/05/22 2104            Total time spent in review of labs and imaging, patient evaluation, formulation of plan, documentation and communication with family 54 minutes  Signed, Terrilee Croak, MD [facility] 09/12/2022

## 2022-09-13 DIAGNOSIS — I4891 Unspecified atrial fibrillation: Secondary | ICD-10-CM | POA: Diagnosis not present

## 2022-09-13 LAB — CBC
HCT: 31.5 % — ABNORMAL LOW (ref 36.0–46.0)
Hemoglobin: 10.1 g/dL — ABNORMAL LOW (ref 12.0–15.0)
MCH: 29.4 pg (ref 26.0–34.0)
MCHC: 32.1 g/dL (ref 30.0–36.0)
MCV: 91.6 fL (ref 80.0–100.0)
Platelets: 254 10*3/uL (ref 150–400)
RBC: 3.44 MIL/uL — ABNORMAL LOW (ref 3.87–5.11)
RDW: 13.4 % (ref 11.5–15.5)
WBC: 7.4 10*3/uL (ref 4.0–10.5)
nRBC: 0 % (ref 0.0–0.2)

## 2022-09-13 LAB — CULTURE, BLOOD (ROUTINE X 2)
Culture: NO GROWTH
Culture: NO GROWTH
Special Requests: ADEQUATE
Special Requests: ADEQUATE

## 2022-09-13 MED ORDER — METOPROLOL TARTRATE 25 MG PO TABS: 25.0000 mg | ORAL_TABLET | Freq: Two times a day (BID) | ORAL | Status: DC

## 2022-09-13 MED ORDER — APIXABAN 5 MG PO TABS: 5.0000 mg | ORAL_TABLET | Freq: Two times a day (BID) | ORAL | Status: AC

## 2022-09-13 MED ORDER — GUAIFENESIN ER 600 MG PO TB12: 1200.0000 mg | ORAL_TABLET | Freq: Two times a day (BID) | ORAL | Status: AC

## 2022-09-13 MED ORDER — AMOXICILLIN-POT CLAVULANATE 875-125 MG PO TABS: 1.0000 | ORAL_TABLET | Freq: Two times a day (BID) | ORAL | Status: AC

## 2022-09-13 MED ORDER — DIGOXIN 62.5 MCG PO TABS: 0.0625 mg | ORAL_TABLET | Freq: Every day | ORAL | Status: AC

## 2022-09-13 MED ORDER — LEVALBUTEROL HCL 0.63 MG/3ML IN NEBU: 0.6300 mg | INHALATION_SOLUTION | Freq: Four times a day (QID) | RESPIRATORY_TRACT | 2 refills | Status: AC | PRN

## 2022-09-13 NOTE — Care Management Important Message (Signed)
Important Message  Patient Details  Name: Michaela [name] MRN: [id number] Date of Birth: 03-13-1956   Medicare Important Message Given:  Yes     Shelda Altes 09/13/2022, 12:51 PM

## 2022-09-13 NOTE — Discharge Summary (Signed)
Physician Discharge Summary  Michaela [name] BSW:[id number] DOB: 1956-03-01 DOA: 09/05/2022  PCP: Javier Glazier, MD  Admit date: 09/05/2022 Discharge date: 09/13/2022  Admitted From: Long-term resident at [facility] burn Discharge disposition: Back to long-term care   Brief narrative: Michaela [name] is a 67 y.o. female with PMH significant for mitral valve prolapse and severe MR s/p repair and annuloplasty, history of astrocytoma s/p radiation, and surgical resection, complicated with obstructive hydrocephalus, now with VP shunt.  She has been living at [facility] long-term care for last 3 years, wheelchair bound at baseline. Patient apparently was having cough, shortness of breath a few days.  Labs showed elevated WBC count and she was started on Bactrim but her symptoms did not improve and hence she was sent to the ED on 12/28.  In the ED, patient was noted to be in A-fib with RVR with heart rate up to 150s, O2 sat was low Lactic acid level was elevated to 5.8 Respiratory virus panel was positive for RSV CTA chest did not show any evidence of pulm embolism.  But it showed left atrial enlargement with left atrial appendage clot and splenic infarct.  It also showed possible bibasilar infiltrates Patient was admitted to ICU Cardiology was consulted as well 12/30, transferred out to [facility] See below for details  Subjective: Patient was seen and examined this morning.   Propped up in bed.  On 2 L oxygen by nasal collar.  Heart rate gradually improving.  Blood pressure low but stable and asymptomatic. Sister at bedside. Cleared by cardio for discharge today.  Hospital course: Sepsis secondary to RSV pneumonia  Aspiration pneumonitis Acute hypoxic respiratory failure Presented with worsening cough, shortness of breath, lactate level on admission was elevated to 5.8. CT chest suggested possible bibasilar infiltrates. She was started on supportive care for RSV. 12/31 and 1/1,  patient had 2 episodes of fever up to 102.3.  She had congested cough as well.  Chest x-ray 1/1 with bibasilar haziness. She was started on IV Unasyn to cover for possible aspiration pneumonia.  Speech therapy evaluation was obtained.  MBS was obtained which did not show any evidence of aspiration pneumonia. No fever in last 3 days.  COVID, normal.  Procalcitonin level normal.  Lactate level normalized.  Currently on oral Augmentin, to continue for 3 more days at discharge. On 3 L oxygen by nasal cannula.  Continue the same. Continue supportive care for RSV. Mucinex for 2 weeks for congested cough. Recent Labs  Lab 09/09/22 0027 09/10/22 0059 09/11/22 0049 09/12/22 0100 09/13/22 0128  WBC 6.5 9.6 8.8 8.9 7.4  PROCALCITON <0.10  --   --   --   --     New onset A-fib with RVR  History of mitral prolapse, severe MR s/p repair and annuloplasty Cardiology consult appreciated.  Patient remains in persistent A-fib with variable rate.  Because of the presence of atrial thrombus on CT scan, cardioversion could not be done without adequate anticoagulation followed by TEE. Per cardiology condition, will discharge the patient home on metoprolol 25 mg twice daily as well as digoxin low-dose.  She will follow-up with cardiology as an outpatient for TEE/DCCV. For anticoagulation.  Large left atrial thrombus Acute splenic infarction CT angio chest obtained on admission showed a large left atrial thrombus probably because of splenic infarct.  She was initially started on heparin drip. Echo showed normal EF,, previous mitral valve repair with mild mitral stenosis and mitral regurgitation. Discharged on oral Eliquis  as mentioned above.  Hypotension Intermittent low blood pressure.  Multifactorial etiology: rapid heart rate, metoprolol use as well as low muscle mass and bedbound status.  Echocardiogram 12/29 with EF 60 to 65%. Continue to monitor blood pressure as an outpatient.    Astrocytoma s/p  resection, obstructive hydrocephalus now with VP shunt Cognitive impairment supportive care, has impaired cognition at baseline.  Chronic immobility Bedbound at nursing home for last few years Has chronic spasticity  Wounds:  -    Discharge Exam:   Vitals:   09/13/22 0649 09/13/22 0800 09/13/22 0830 09/13/22 1117  BP: 93/60 (!) 89/65 (!) 89/65 (!) 91/54  Pulse: 62 (!) 103 70 64  Resp: '11 11 20 17  '$ Temp: (!) 97.4 F (36.3 C) 97.6 F (36.4 C) 97.6 F (36.4 C) 97.6 F (36.4 C)  TempSrc: Oral Oral  Oral  SpO2: 92% 90% 94% 91%  Weight:      Height:        Body mass index is 23.38 kg/m.  General exam: Elderly Caucasian female.  Not in pain Skin: No rashes, lesions or ulcers. HEENT: Atraumatic, normocephalic, no obvious bleeding Lungs: Diminished air entry in both bases.  Clear to auscultation bilaterally.  Intermittent congested cough. CVS: A-fib with intermittent tachycardia GI/Abd soft, nontender, nondistended, bowel sound present CNS: Alert, awake, slow to respond.  Remains at baseline. Psychiatry: Sad affect Extremities: No pedal edema, no calf tenderness  Follow ups:    Follow-up Information     Deberah Pelton, NP Follow up on 10/14/2022.   Specialty: Cardiology Why: 3:35PM. Cardiology follow up with Dr. Kathalene Frames nurse practitioner Contact information: [address] [place] [address] [phone]         Javier Glazier, MD Follow up.   Specialty: Internal Medicine Contact information: [place] [address] [phone]                 Discharge Instructions:   Discharge Instructions     Call MD for:  difficulty breathing, headache or visual disturbances   Complete by: As directed    Call MD for:  extreme fatigue   Complete by: As directed    Call MD for:  hives   Complete by: As directed    Call MD for:  persistant dizziness or light-headedness   Complete by: As directed    Call MD for:  persistant  nausea and vomiting   Complete by: As directed    Call MD for:  severe uncontrolled pain   Complete by: As directed    Call MD for:  temperature >100.4   Complete by: As directed    Diet - low sodium heart healthy   Complete by: As directed    Discharge instructions   Complete by: As directed    Discharge instructions for CHF Check weight daily -preferably same time every day. Restrict fluid intake to 1200 ml daily Restrict salt intake to less than 2 g daily. Call MD if you have one of the following symptoms 1) 3 pound weight gain in 24 hours or 5 pounds in 1 week  2) swelling in the hands, feet or stomach  3) progressive shortness of breath 4) if you have to sleep on extra pillows at night in order to breathe     General discharge instructions: Follow with Primary MD Javier Glazier, MD in 7 days  Please request your PCP  to go over your hospital tests, procedures, radiology results at  the follow up. Please get your medicines reviewed and adjusted.  Your PCP may decide to repeat certain labs or tests as needed. Do not drive, operate heavy machinery, perform activities at heights, swimming or participation in water activities or provide baby sitting services if your were admitted for syncope or siezures until you have seen by Primary MD or a Neurologist and advised to do so again. [place] Controlled Substance Reporting System database was reviewed. Do not drive, operate heavy machinery, perform activities at heights, swim, participate in water activities or provide baby-sitting services while on medications for pain, sleep and mood until your outpatient physician has reevaluated you and advised to do so again.  You are strongly recommended to comply with the dose, frequency and duration of prescribed medications. Activity: As tolerated with Full fall precautions use walker/cane & assistance as needed Avoid using any recreational substances like cigarette, tobacco, alcohol, or  non-prescribed drug. If you experience worsening of your admission symptoms, develop shortness of breath, life threatening emergency, suicidal or homicidal thoughts you must seek medical attention immediately by calling 911 or calling your MD immediately  if symptoms less severe. You must read complete instructions/literature along with all the possible adverse reactions/side effects for all the medicines you take and that have been prescribed to you. Take any new medicine only after you have completely understood and accepted all the possible adverse reactions/side effects.  Wear Seat belts while driving. You were cared for by a hospitalist during your hospital stay. If you have any questions about your discharge medications or the care you received while you were in the hospital after you are discharged, you can call the unit and ask to speak with the hospitalist or the covering physician. Once you are discharged, your primary care physician will handle any further medical issues. Please note that NO REFILLS for any discharge medications will be authorized once you are discharged, as it is imperative that you return to your primary care physician (or establish a relationship with a primary care physician if you do not have one).   Increase activity slowly   Complete by: As directed        Discharge Medications:   Allergies as of 09/13/2022       Reactions   Compazine Other (See Comments)   "eyes get buggy"   Prochlorperazine    Prochlorperazine Edisylate    Prochlorperazine Maleate         Medication List     STOP taking these medications    bisacodyl 10 MG suppository Commonly known as: DULCOLAX   cephALEXin 500 MG capsule Commonly known as: KEFLEX   ipratropium-albuterol 0.5-2.5 (3) MG/3ML Soln Commonly known as: DUONEB       TAKE these medications    amoxicillin-clavulanate 875-125 MG tablet Commonly known as: AUGMENTIN Take 1 tablet by mouth every 12 (twelve) hours  for 3 days.   apixaban 5 MG Tabs tablet Commonly known as: ELIQUIS Take 1 tablet (5 mg total) by mouth 2 (two) times daily.   atorvastatin 20 MG tablet Commonly known as: LIPITOR Take 20 mg by mouth daily.   Biofreeze 4 % Gel Generic drug: Menthol (Topical Analgesic) Apply 1 application topically 2 (two) times daily. Apply topically BID to sore areas - back   Calmoseptine 0.44-20.6 % Oint Generic drug: Menthol-Zinc Oxide Apply 1 Application topically 2 (two) times daily. Apply to buttocks   cholecalciferol 25 MCG (1000 UNIT) tablet Commonly known as: VITAMIN D3 Take 1,000 Units by mouth  daily.   clonazePAM 0.5 MG tablet Commonly known as: KLONOPIN Take 0.25 mg by mouth 3 (three) times daily. Hold if sedated   Digoxin 62.5 MCG Tabs Take 0.0625 mg by mouth daily. Start taking on: September 14, 2022   docusate sodium 100 MG capsule Commonly known as: COLACE Take 100 mg by mouth daily.   escitalopram 20 MG tablet Commonly known as: LEXAPRO Take 20 mg by mouth daily.   Florastor 250 MG capsule Generic drug: saccharomyces boulardii Take 250 mg by mouth 2 (two) times daily.   guaiFENesin 600 MG 12 hr tablet Commonly known as: MUCINEX Take 2 tablets (1,200 mg total) by mouth 2 (two) times daily for 14 days.   levalbuterol 0.63 MG/3ML nebulizer solution Commonly known as: XOPENEX Take 3 mLs (0.63 mg total) by nebulization every 6 (six) hours as needed for wheezing or shortness of breath.   loratadine 10 MG tablet Commonly known as: CLARITIN Take 10 mg by mouth daily.   melatonin 3 MG Tabs tablet Take 3 mg by mouth at bedtime.   metoprolol tartrate 25 MG tablet Commonly known as: LOPRESSOR Take 1 tablet (25 mg total) by mouth 2 (two) times daily.   Myrbetriq 25 MG Tb24 tablet Generic drug: mirabegron ER Take 25 mg by mouth daily.   nystatin-triamcinolone cream Commonly known as: MYCOLOG II Apply 1 Application topically 2 (two) times daily.   omeprazole 20 MG  capsule Commonly known as: PRILOSEC Take 20 mg by mouth daily.   OXYGEN Inhale 2 L into the lungs daily as needed (if needed for shortness of breath).   polyethylene glycol 17 g packet Commonly known as: MIRALAX / GLYCOLAX Take 17 g by mouth daily.   polyvinyl alcohol 1.4 % ophthalmic solution Commonly known as: LIQUIFILM TEARS Place 1 drop into both eyes 4 (four) times daily.         The results of significant diagnostics from this hospitalization (including imaging, microbiology, ancillary and laboratory) are listed below for reference.    Procedures and Diagnostic Studies:   ECHOCARDIOGRAM COMPLETE  Result Date: 09/06/2022    ECHOCARDIOGRAM REPORT   Patient Name:   Michaela [name] Date of Exam: 09/06/2022 Medical Rec #:  [id number]           Height:       59.0 in Accession #:    [id number]          Weight:       106.0 lb Date of Birth:  Mar 31, 1956           BSA:          1.408 m Patient Age:    72 years            BP:           105/79 mmHg Patient Gender: F                   HR:           137 bpm. Exam Location:  Inpatient Procedure: 2D Echo, Cardiac Doppler and Color Doppler Indications:    Mitral Valve Disorder I05.9  History:        Patient has prior history of Echocardiogram examinations. CHF,                 Arrythmias:Atrial Fibrillation; Risk Factors:Dyslipidemia.                  Mitral Valve: 36 prosthetic prosthetic annuloplasty ring valve  is present in the mitral position.  Sonographer:    Ronny Flurry Referring Phys: [place]  1. Left ventricular ejection fraction, by estimation, is 60 to 65%. The left ventricle has normal function. The left ventricle has no regional wall motion abnormalities. Left ventricular diastolic function could not be evaluated.  2. Right ventricular systolic function is normal. The right ventricular size is normal. There is normal pulmonary artery systolic pressure.  3. Left atrial size was mildly  dilated.  4. The mitral valve is degenerative. Mild mitral valve regurgitation. Mild mitral stenosis. The mean mitral valve gradient is 7.5 mmHg with average heart rate of 120 bpm. There is a 36 prosthetic prosthetic annuloplasty ring present in the mitral position.  5. The aortic valve is tricuspid. Aortic valve regurgitation is not visualized. No aortic stenosis is present.  6. The inferior vena cava is normal in size with greater than 50% respiratory variability, suggesting right atrial pressure of 3 mmHg. FINDINGS  Left Ventricle: Left ventricular ejection fraction, by estimation, is 60 to 65%. The left ventricle has normal function. The left ventricle has no regional wall motion abnormalities. The left ventricular internal cavity size was normal in size. There is  no left ventricular hypertrophy. Left ventricular diastolic function could not be evaluated due to mitral valve repair. Left ventricular diastolic function could not be evaluated. Right Ventricle: The right ventricular size is normal. No increase in right ventricular wall thickness. Right ventricular systolic function is normal. There is normal pulmonary artery systolic pressure. The tricuspid regurgitant velocity is 1.69 m/s, and  with an assumed right atrial pressure of 8 mmHg, the estimated right ventricular systolic pressure is 45.8 mmHg. Left Atrium: Left atrial size was mildly dilated. Right Atrium: Right atrial size was normal in size. Pericardium: There is no evidence of pericardial effusion. Mitral Valve: The mitral valve is degenerative in appearance. Mild mitral annular calcification. Mild mitral valve regurgitation. There is a 36 prosthetic prosthetic annuloplasty ring present in the mitral position. Mild mitral valve stenosis. MV peak gradient, 20.2 mmHg. The mean mitral valve gradient is 7.5 mmHg with average heart rate of 120 bpm. Tricuspid Valve: The tricuspid valve is normal in structure. Tricuspid valve regurgitation is mild . No  evidence of tricuspid stenosis. Aortic Valve: The aortic valve is tricuspid. Aortic valve regurgitation is not visualized. No aortic stenosis is present. Aortic valve mean gradient measures 1.0 mmHg. Aortic valve peak gradient measures 2.5 mmHg. Aortic valve area, by VTI measures 2.89 cm. Pulmonic Valve: The pulmonic valve was normal in structure. Pulmonic valve regurgitation is trivial. No evidence of pulmonic stenosis. Aorta: The aortic root is normal in size and structure. Venous: The inferior vena cava is normal in size with greater than 50% respiratory variability, suggesting right atrial pressure of 3 mmHg. IAS/Shunts: No atrial level shunt detected by color flow Doppler.  LEFT VENTRICLE PLAX 2D LVIDd:         4.40 cm LVIDs:         3.20 cm LV PW:         0.90 cm LV IVS:        0.80 cm LVOT diam:     2.10 cm LV SV:         32 LV SV Index:   23 LVOT Area:     3.46 cm  RIGHT VENTRICLE          IVC RV Basal diam:  3.00 cm  IVC diam: 1.60 cm TAPSE (M-mode):  1.3 cm LEFT ATRIUM           Index        RIGHT ATRIUM          Index LA diam:      4.40 cm 3.12 cm/m   RA Area:     9.65 cm LA Vol (A4C): 45.9 ml 32.59 ml/m  RA Volume:   21.30 ml 15.12 ml/m  AORTIC VALVE AV Area (Vmax):    2.88 cm AV Area (Vmean):   2.75 cm AV Area (VTI):     2.89 cm AV Vmax:           79.60 cm/s AV Vmean:          54.900 cm/s AV VTI:            0.111 m AV Peak Grad:      2.5 mmHg AV Mean Grad:      1.0 mmHg LVOT Vmax:         66.13 cm/s LVOT Vmean:        43.600 cm/s LVOT VTI:          0.093 m LVOT/AV VTI ratio: 0.83  AORTA Ao Root diam: 2.70 cm Ao Asc diam:  2.50 cm MITRAL VALVE              TRICUSPID VALVE MV Area VTI:  0.56 cm    TR Peak grad:   11.4 mmHg MV Peak grad: 20.2 mmHg   TR Vmax:        169.00 cm/s MV Mean grad: 7.5 mmHg MV Vmax:      2.25 m/s    SHUNTS MV Vmean:     122.5 cm/s  Systemic VTI:  0.09 m                           Systemic Diam: 2.10 cm Skeet Latch MD Electronically signed by Skeet Latch MD  Signature Date/Time: 09/06/2022/4:56:26 PM    Final    CT Angio Chest/Abd/Pel for Dissection W and/or Wo Contrast  Result Date: 09/05/2022 CLINICAL DATA:  Left atrial thrombus.  Altered mental status. EXAM: CT ANGIOGRAPHY CHEST, ABDOMEN AND PELVIS TECHNIQUE: Non-contrast CT of the chest was initially obtained. Multidetector CT imaging through the chest, abdomen and pelvis was performed using the standard protocol during bolus administration of intravenous contrast. Multiplanar reconstructed images and MIPs were obtained and reviewed to evaluate the vascular anatomy. RADIATION DOSE REDUCTION: This exam was performed according to the departmental dose-optimization program which includes automated exposure control, adjustment of the mA and/or kV according to patient size and/or use of iterative reconstruction technique. CONTRAST:  18m OMNIPAQUE IOHEXOL 350 MG/ML SOLN COMPARISON:  Earlier chest CT dated 09/05/2022. FINDINGS: Evaluation is limited due to streak artifact caused by patient's arms and dense contrast within the vessels. CTA CHEST FINDINGS Cardiovascular: Borderline cardiomegaly. There is large thrombus within the left atrial appendage as seen on the earlier CT. An intracardiac mass or malignancy is not excluded. Clinical correlation and follow-up recommended. No pericardial effusion. Mild atherosclerotic calcification of the thoracic aorta. No aneurysmal dilatation or dissection. The origins of the great vessels of the aortic arch appear patent as visualized. No pulmonary artery embolus identified. Mediastinum/Nodes: Borderline enlarged mediastinal lymph nodes in the prevascular space measure up to 11 mm short axis. The esophagus is grossly unremarkable. No mediastinal fluid collection. Lungs/Pleura: Small bilateral pleural effusions with associated bibasilar compressive atelectasis. Infiltrate is not excluded. Diffuse interstitial and interlobular  septal prominence consistent with edema. There is no  pneumothorax. The central airways are patent. Musculoskeletal: No acute osseous pathology. Review of the MIP images confirms the above findings. CTA ABDOMEN AND PELVIS FINDINGS VASCULAR Aorta: Moderate atherosclerotic calcification of the aorta. No aneurysmal dilatation or dissection. No periaortic fluid collection. Celiac: The celiac trunk and its major branches are patent. SMA: The SMA is patent. Renals: The renal arteries are patent. IMA: The IMA is patent. Inflow: The iliac arteries are patent. No aneurysmal dilatation or dissection. Veins: No obvious venous abnormality within the limitations of this arterial phase study. Review of the MIP images confirms the above findings. NON-VASCULAR No intra-abdominal free air or free fluid. Hepatobiliary: The liver is unremarkable. No biliary dilatation. Multiple layering gallstones. No pericholecystic fluid or evidence of acute cholecystitis by CT. Pancreas: Unremarkable. No pancreatic ductal dilatation or surrounding inflammatory changes. Spleen: A 2 cm hypoenhancing focus in the inferior pole of the spleen is suboptimally evaluated but may represent an infarct. Adrenals/Urinary Tract: The adrenal glands are unremarkable. The kidneys, visualized ureters appear unremarkable. There is diffuse trabeculated appearance of the bladder wall likely related to chronic bladder outlet obstruction. Stomach/Bowel: There is moderate stool throughout the colon. There is no bowel obstruction or active inflammation. The appendix is normal. Lymphatic: No adenopathy. Reproductive: The uterus is grossly unremarkable. No adnexal masses. Other: VP shunt tubes in the anterior abdomen with tips in the left lateral abdomen and right hemipelvis. No fluid collection. Musculoskeletal: Osteopenia with degenerative changes of the spine. Old appearing mild compression fracture of superior endplate of L1. Review of the MIP images confirms the above findings. IMPRESSION: 1. No aortic aneurysm or  dissection. 2. Large thrombus within the left atrial appendage as seen on the earlier CT. An intracardiac mass or malignancy is not excluded. 3. Small bilateral pleural effusions with associated bibasilar compressive atelectasis. Infiltrate is not excluded. 4. Possible small focal splenic infarct. 5. Cholelithiasis. 6. No bowel obstruction. Normal appendix. Electronically Signed   By: Anner Crete M.D.   On: 09/05/2022 21:37   CT Head Wo Contrast  Result Date: 09/05/2022 CLINICAL DATA:  Altered mental status EXAM: CT HEAD WITHOUT CONTRAST TECHNIQUE: Contiguous axial images were obtained from the base of the skull through the vertex without intravenous contrast. RADIATION DOSE REDUCTION: This exam was performed according to the departmental dose-optimization program which includes automated exposure control, adjustment of the mA and/or kV according to patient size and/or use of iterative reconstruction technique. COMPARISON:  10/16/2021 FINDINGS: Brain: Right frontal approach shunt catheter terminating at the right foramen of [place]ro. Right parietal approach shunt catheter tip along the roof of the right lateral ventricle. The sizes of the ventricles have markedly decreased since 10/16/2021. There is unchanged gliosis along the right frontal catheter tract. Unchanged postsurgical appearance of the posterior fossa and brainstem. No acute hemorrhage, mass or extra-axial collection. Chronic left posterior parietal encephalomalacia. Vascular: No hyperdense vessel or unexpected calcification. Skull: Right frontal and parietal burr holes. Remote suboccipital craniectomy. Sinuses/Orbits: No acute finding. Other: None. IMPRESSION: 1. No acute intracranial abnormality. 2. Marked decrease in size of the lateral ventricles compared to 10/16/2021. 3. Unchanged postsurgical appearance of the posterior fossa and brainstem. Electronically Signed   By: Ulyses Jarred M.D.   On: 09/05/2022 21:28   CT Chest W  Contrast  Result Date: 09/05/2022 CLINICAL DATA:  Respiratory illness. EXAM: CT CHEST WITH CONTRAST TECHNIQUE: Multidetector CT imaging of the chest was performed during intravenous contrast administration. RADIATION DOSE REDUCTION: This exam  was performed according to the departmental dose-optimization program which includes automated exposure control, adjustment of the mA and/or kV according to patient size and/or use of iterative reconstruction technique. CONTRAST:  74m OMNIPAQUE IOHEXOL 350 MG/ML SOLN COMPARISON:  Chest radiograph dated 09/05/2022. CT dated 09/24/2011. FINDINGS: Cardiovascular: Borderline cardiomegaly. No pericardial effusion. There is a large thrombus in the left atrium and left atrial appendage. Underlying mass is not excluded malignancy is not excluded. This can predispose to systemic embolization and infarcts. There is mild atherosclerotic calcification of the thoracic aorta. No aneurysmal dilatation or dissection. The central pulmonary arteries appear patent for the degree of opacification. Mediastinum/Nodes: No hilar adenopathy. Mildly enlarged mediastinal lymph node measure up to 11 mm in short axis in the prevascular space. The esophagus is grossly unremarkable. No mediastinal fluid collection. Lungs/Pleura: Small bilateral pleural effusions with partial compressive atelectasis of the lower lobes. There is diffuse interstitial and interlobular septal prominence consistent with edema. There is no pneumothorax. The central airways are patent. Upper Abdomen: A 2 cm hypoenhancing focus in the inferior spleen may represent a focus of infarct. Multiple gallstones. Musculoskeletal: Osteopenia with degenerative changes of the spine. Age indeterminate, likely old, compression fracture of the superior endplate of L1. No acute osseous pathology. IMPRESSION: 1. Large thrombus in the left atrium and left atrial appendage. This can predispose to systemic embolization and infarcts. 2. Probable  small focus of infarct in the spleen. If there is clinical concern for additional intra-abdominal infarcts, CT of the abdomen pelvis may provide better evaluation. 3. Small bilateral pleural effusions with partial compressive atelectasis of the lower lobes. 4. Interstitial and interlobular septal prominence consistent with edema. 5. Cholelithiasis. 6.  Aortic Atherosclerosis (ICD10-I70.0). These results were called by telephone at the time of interpretation on 09/05/2022 at 8:41 pm to provider ACampbell Stall, who verbally acknowledged these results. Electronically Signed   By: AAnner CreteM.D.   On: 09/05/2022 20:42   DG Chest Port 1 View  Result Date: 09/05/2022 CLINICAL DATA:  Questionable sepsis. EXAM: PORTABLE CHEST 1 VIEW COMPARISON:  Chest radiograph dated 04/01/2016. FINDINGS: Mild diffuse interstitial prominence, likely edema. Atypical infiltrate is less likely but not excluded. There is a small left pleural effusion. No pneumothorax. The cardiac silhouette is within normal limits. Cardiac valve repair. Atherosclerotic calcification of the arch. No acute osseous pathology. Right VP shunt. IMPRESSION: 1. Small left pleural effusion. 2. Mild diffuse interstitial prominence, likely edema. Electronically Signed   By: AAnner CreteM.D.   On: 09/05/2022 19:11     Labs:   Basic Metabolic Panel: Recent Labs  Lab 09/07/22 0052 09/11/22 0049  NA 137 138  K 3.6 3.6  CL 102 102  CO2 26 27  GLUCOSE 82 81  BUN 14 13  CREATININE 0.55 0.63  CALCIUM 8.1* 7.8*  MG 2.2  --   PHOS 3.7  --    GFR Estimated Creatinine Clearance: 48 mL/min (by C-G formula based on SCr of 0.63 mg/dL). Liver Function Tests: No results for input(s): "AST", "ALT", "ALKPHOS", "BILITOT", "PROT", "ALBUMIN" in the last 168 hours. No results for input(s): "LIPASE", "AMYLASE" in the last 168 hours. No results for input(s): "AMMONIA" in the last 168 hours. Coagulation profile No results for input(s): "INR", "PROTIME"  in the last 168 hours.  CBC: Recent Labs  Lab 09/09/22 0027 09/10/22 0059 09/11/22 0049 09/12/22 0100 09/13/22 0128  WBC 6.5 9.6 8.8 8.9 7.4  HGB 9.9* 10.2* 10.2* 10.0* 10.1*  HCT 30.6* 31.7* 31.9* 31.4* 31.5*  MCV 90.5 91.6 92.2 90.8 91.6  PLT 194 185 199 206 254   Cardiac Enzymes: No results for input(s): "CKTOTAL", "CKMB", "CKMBINDEX", "TROPONINI" in the last 168 hours. BNP: Invalid input(s): "POCBNP" CBG: Recent Labs  Lab 09/06/22 1201  GLUCAP 88   D-Dimer No results for input(s): "DDIMER" in the last 72 hours. Hgb A1c No results for input(s): "HGBA1C" in the last 72 hours. Lipid Profile No results for input(s): "CHOL", "HDL", "LDLCALC", "TRIG", "CHOLHDL", "LDLDIRECT" in the last 72 hours. Thyroid function studies No results for input(s): "TSH", "T4TOTAL", "T3FREE", "THYROIDAB" in the last 72 hours.  Invalid input(s): "FREET3" Anemia work up No results for input(s): "VITAMINB12", "FOLATE", "FERRITIN", "TIBC", "IRON", "RETICCTPCT" in the last 72 hours. Microbiology Recent Results (from the past 240 hour(s))  Blood Culture (routine x 2)     Status: None   Collection Time: 09/05/22  6:10 PM   Specimen: BLOOD  Result Value Ref Range Status   Specimen Description BLOOD LEFT ANTECUBITAL  Final   Special Requests   Final    BOTTLES DRAWN AEROBIC AND ANAEROBIC Blood Culture adequate volume   Culture   Final    NO GROWTH 5 DAYS Performed at [facility], [address], [place], [place] [address]    Report Status 09/10/2022 FINAL  Final  Resp panel by RT-PCR (RSV, Flu A&B, Covid) Anterior Nasal Swab     Status: Abnormal   Collection Time: 09/05/22  6:13 PM   Specimen: Anterior Nasal Swab  Result Value Ref Range Status   SARS Coronavirus 2 by RT PCR NEGATIVE NEGATIVE Final    Comment: (NOTE) SARS-CoV-2 target nucleic acids are NOT DETECTED.  The SARS-CoV-2 RNA is generally detectable in upper respiratory specimens during the acute phase of infection. The  lowest concentration of SARS-CoV-2 viral copies this assay can detect is 138 copies/mL. A negative result does not preclude SARS-Cov-2 infection and should not be used as the sole basis for treatment or other patient management decisions. A negative result may occur with  improper specimen collection/handling, submission of specimen other than nasopharyngeal swab, presence of viral mutation(s) within the areas targeted by this assay, and inadequate number of viral copies(<138 copies/mL). A negative result must be combined with clinical observations, patient history, and epidemiological information. The expected result is Negative.  Fact Sheet for Patients:  EntrepreneurPulse.com.au  Fact Sheet for Healthcare Providers:  IncredibleEmployment.be  This test is no t yet approved or cleared by the [place] FDA and  has been authorized for detection and/or diagnosis of SARS-CoV-2 by FDA under an Emergency Use Authorization (EUA). This EUA will remain  in effect (meaning this test can be used) for the duration of the COVID-19 declaration under Section 564(b)(1) of the Act, 21 U.S.C.section 360bbb-3(b)(1), unless the authorization is terminated  or revoked sooner.       Influenza A by PCR NEGATIVE NEGATIVE Final   Influenza B by PCR NEGATIVE NEGATIVE Final    Comment: (NOTE) The Xpert Xpress SARS-CoV-2/FLU/RSV plus assay is intended as an aid in the diagnosis of influenza from Nasopharyngeal swab specimens and should not be used as a sole basis for treatment. Nasal washings and aspirates are unacceptable for Xpert Xpress SARS-CoV-2/FLU/RSV testing.  Fact Sheet for Patients: EntrepreneurPulse.com.au  Fact Sheet for Healthcare Providers: IncredibleEmployment.be  This test is not yet approved or cleared by the [place] FDA and has been authorized for detection and/or diagnosis of SARS-CoV-2 by FDA under  an Emergency Use Authorization (EUA). This EUA  will remain in effect (meaning this test can be used) for the duration of the COVID-19 declaration under Section 564(b)(1) of the Act, 21 U.S.C. section 360bbb-3(b)(1), unless the authorization is terminated or revoked.     Resp Syncytial Virus by PCR POSITIVE (A) NEGATIVE Final    Comment: (NOTE) Fact Sheet for Patients: EntrepreneurPulse.com.au  Fact Sheet for Healthcare Providers: IncredibleEmployment.be  This test is not yet approved or cleared by the [place] FDA and has been authorized for detection and/or diagnosis of SARS-CoV-2 by FDA under an Emergency Use Authorization (EUA). This EUA will remain in effect (meaning this test can be used) for the duration of the COVID-19 declaration under Section 564(b)(1) of the Act, 21 U.S.C. section 360bbb-3(b)(1), unless the authorization is terminated or revoked.  Performed at [facility], [place] [address], [place], [place] [address]   Blood Culture (routine x 2)     Status: None   Collection Time: 09/05/22  6:13 PM   Specimen: BLOOD  Result Value Ref Range Status   Specimen Description BLOOD RIGHT ANTECUBITAL  Final   Special Requests   Final    BOTTLES DRAWN AEROBIC AND ANAEROBIC Blood Culture adequate volume   Culture   Final    NO GROWTH 5 DAYS Performed at [facility], [place] [address], [place], [place] [address]    Report Status 09/10/2022 FINAL  Final  MRSA Next Gen by PCR, Nasal     Status: None   Collection Time: 09/05/22  6:47 PM   Specimen: Nasal Mucosa; Nasal Swab  Result Value Ref Range Status   MRSA by PCR Next Gen NOT DETECTED NOT DETECTED Final    Comment: (NOTE) The GeneXpert MRSA Assay (FDA approved for NASAL specimens only), is one component of a comprehensive MRSA colonization surveillance program. It is not intended to diagnose MRSA infection nor to guide or monitor treatment for MRSA infections. Test  performance is not FDA approved in patients less than 47 years old. Performed at [facility], [place] [address] [place], [place] [address]   Urine Culture     Status: Abnormal   Collection Time: 09/06/22  1:25 AM   Specimen: In/Out Cath Urine  Result Value Ref Range Status   Specimen Description IN/OUT CATH URINE  Final   Special Requests NONE  Final   Culture (A)  Final    400 COLONIES/mL AEROCOCCUS SPECIES 100 COLONIES/mL ENTEROCOCCUS FAECALIS Standardized susceptibility testing for this organism is not available. Performed at [facility], [place] [address], [place], [place] [address]    Report Status 09/11/2022 FINAL  Final   Organism ID, Bacteria ENTEROCOCCUS FAECALIS (A)  Final      Susceptibility   Enterococcus faecalis - MIC*    AMPICILLIN <=2 SENSITIVE Sensitive     NITROFURANTOIN <=16 SENSITIVE Sensitive     VANCOMYCIN 1 SENSITIVE Sensitive     * 100 COLONIES/mL ENTEROCOCCUS FAECALIS  Culture, blood (Routine X 2) w Reflex to ID Panel     Status: None (Preliminary result)   Collection Time: 09/08/22  9:53 PM   Specimen: BLOOD  Result Value Ref Range Status   Specimen Description BLOOD BLOOD RIGHT HAND  Final   Special Requests   Final    BOTTLES DRAWN AEROBIC AND ANAEROBIC Blood Culture adequate volume   Culture   Final    NO GROWTH 4 DAYS Performed at [facility], [address] [place], [place] [address]    Report Status  PENDING  Incomplete  Culture, blood (Routine X 2) w Reflex to ID Panel     Status: None (Preliminary result)   Collection Time: 09/08/22  9:53 PM   Specimen: BLOOD  Result Value Ref Range Status   Specimen Description BLOOD BLOOD RIGHT HAND  Final   Special Requests   Final    BOTTLES DRAWN AEROBIC AND ANAEROBIC Blood Culture adequate volume   Culture   Final    NO GROWTH 4 DAYS Performed at [facility], [address], [place], [place] [address]    Report Status PENDING  Incomplete    Time coordinating discharge: 35  minutes  Signed: Lalia Loudon  [facility] 09/13/2022, 11:25 AM

## 2022-09-13 NOTE — Progress Notes (Signed)
Pt being d/c, Vss, IV removed, Education complete, report called  Alvis Lemmings, RN 09/13/2022 2:43 PM

## 2022-09-13 NOTE — Progress Notes (Signed)
Speech Language Pathology Treatment: Dysphagia  Patient Details Name: Michaela [name] MRN: [id number] DOB: May 06, 1956 Today's Date: 09/13/2022 Time: 7829-5621 SLP Time Calculation (min) (ACUTE ONLY): 15 min  Assessment / Plan / Recommendation Clinical Impression  Patient seen for f/u diet tolerance assessment after instrumental testing complete 1/3. Patient reports no difficulty swallowing prescribed diet. She was able to consume clinician provided po trials, regular solids and thin liquids, under skilled observation with no observable dysphagia or s/s of aspiration. Oral phase timely. Overall, she appears to be tolerating diet. No f/u SLP services indicated at this time.   HPI HPI: 67 yo female adm to [facility] with cough and increased dyspnea over night- found to have RSV and a clot.  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  During imaging, she was found to have RSV and Left atrial appendage clot - was transferred out of ICU - she is on nasal cannula oxygen.  Pt is now on anticoagulant per cardiology for her clot.  Swallow eval ordered.  Pt admits to issues with swallowing, sensing food retention in esophagus and also high in the throat *based SLP pointing to isolate area of retention. She denies having lost weight.  Is unable to feed herself and resides at [facility].  Brain imaging has shown atrophy of brainstem and cerebellar area.      SLP Plan  Discharge SLP treatment due to (comment);All goals met      Recommendations for follow up therapy are one component of a multi-disciplinary discharge planning process, led by the attending physician.  Recommendations may be updated based on patient status, additional functional criteria and insurance authorization.    Recommendations  Diet recommendations: Regular;Thin liquid Liquids provided via: Cup;Straw Medication  Administration: Whole meds with liquid Supervision: Staff to assist with self feeding Compensations: Slow rate;Small sips/bites Postural Changes and/or Swallow Maneuvers: Seated upright 90 degrees      PMSV Supervision: Full         Oral Care Recommendations: Oral care BID Follow Up Recommendations: No SLP follow up SLP Visit Diagnosis: Dysphagia, unspecified (R13.10) Plan: Discharge SLP treatment due to (comment);All goals met        Gabriel Rainwater MA, CCC-SLP    Michaela [name]  09/13/2022, 9:39 AM

## 2022-09-13 NOTE — TOC Transition Note (Signed)
Transition of Care [facility]) - CM/SW Discharge Note   Patient Details  Name: Michaela [name] MRN: [id number] Date of Birth: 12-15-1955  Transition of Care [facility] CM/SW Contact:  Bjorn Pippin, LCSW Phone Number: 09/13/2022, 2:24 PM   Clinical Narrative:    Patient will DC to: [facility]  Anticipated DC date: 09/13/2022 Family notified: Yes, sister [name].  Transport by: Corey Harold   Per MD patient ready for DC to [facility] RN, patient, patient's family, and facility notified of DC. Discharge Summary and FL2 sent to facility. RN to call report prior to discharge [phone]. DC packet on chart. Ambulance transport requested for patient.   CSW will sign off for now as social work intervention is no longer needed. Please consult [place] again if new needs arise.    Final next level of care: Skilled Nursing Facility Barriers to Discharge: No Barriers Identified   Patient Goals and CMS Choice      Discharge Placement                Patient chooses bed at: [facility] Patient to be transferred to facility by: [facility] Name of family member notified: [name] (sister) Patient and family notified of of transfer: 09/13/22  Discharge Plan and Services Additional resources added to the After Visit Summary for                                       Social Determinants of Health (SDOH) Interventions SDOH Screenings   Food Insecurity: No Food Insecurity (01/22/2018)  Transportation Needs: No Transportation Needs (01/22/2018)  Depression (PHQ2-9): Low Risk  (03/19/2019)  Financial Resource Strain: Low Risk  (01/22/2018)  Physical Activity: Inactive (01/22/2018)  Social Connections: Moderately Isolated (01/22/2018)  Stress: No Stress Concern Present (01/22/2018)  Tobacco Use: Medium Risk (09/05/2022)     Readmission Risk Interventions     No data to display           Beckey Rutter, MSW, LCSWA, LCASA Transitions of Care  Clinical Social Worker I

## 2022-09-27 ENCOUNTER — Ambulatory Visit: Payer: Medicare Other | Admitting: Adult Health

## 2022-10-13 NOTE — Progress Notes (Deleted)
Cardiology Clinic Note   Patient Name: Michaela [name] Date of Encounter: 10/13/2022  Primary Care Provider:  Javier Glazier, MD Primary Cardiologist:  None  Patient Profile    Michaela [name] is a 67 y.o. 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  Past Medical History    Past Medical History:  Diagnosis Date   Astrocytoma brain tumor ([place]) 1971   astrocytoma treated with radiation   C. difficile colitis [id number]   complicated by sepsis/ARDS and prolonged hosp   Depression    Hyperlipidemia    Mitral regurgitation 10/2011   severe, s/p min invasive repair and annuloplasty   Mitral valve prolapse    Neuromuscular disorder (HCC)    numbness in hands and feet   Osteoarthritis    Osteoporosis    DEXA @ [facility] 11/2012: -3.5, prior tx Reclast, last in 2011, rec change to Prolia   Overactive bladder    Pleural effusion, left 11/11/2011   Vitamin D deficiency    Past Surgical History:  Procedure Laterality Date   CARDIAC CATHETERIZATION     Jan 2013   KNEE ARTHROSCOPY Right    MITRAL VALVE REPAIR  10/15/2011   Procedure: MINIMALLY INVASIVE MITRAL VALVE REPAIR (MVR);  Surgeon: Rexene Alberts, MD;  Location: [facility];  Service: Open Heart Surgery;  Laterality: Right;   TRANSESOPHAGEAL ECHOCARDIOGRAM  7/12   US ECHOCARDIOGRAPHY  6/12   VENTRICULOPERITONEAL SHUNT  1971    Allergies  Allergies  Allergen Reactions   Compazine Other (See Comments)    "eyes get buggy"   Prochlorperazine    Prochlorperazine Edisylate    Prochlorperazine Maleate     History of Present Illness    Michaela [name] has a past medical history of: PAF. Chronic diastolic heart failure. LAA thrombus. CT chest with contrast 09/05/2022: Large thrombus in the left  atrium and LAA. CTA chest abdomen pelvis 09/05/2022: Large thrombus within the LAA.  An intracardiac mass or malignancy is not excluded. Mitral valve prolapse. Minimally invasive mitral valve repair 10/15/2011. Echo 09/06/2022: EF 60 to 65%.  Mild LAE.  Mild MR.  Mild mitral stenosis.  Prosthetic annuloplasty ring present in the mitral position. Astrocytoma brain tumor. S/p resection and radiation 1971. Residual ataxia and spasticity.  Michaela [name] was first evaluated by Dr. Aundra Dubin on 03/04/2011 for mitral regurgitation.  She was followed by Dr. Doreatha Lew in the past.  She has past medical history as outlined above.  Patient was last seen in the office by Dr. Algernon Huxley on 03/03/2015.  At that time she was doing well and no medication changes were made.  Most recently, patient was evaluated by cardiology on 09/06/2022 during hospitalization for RSV and pneumonia.  Patient presented to the emergency department via EMS from [facility] on 09/05/2022 for a several day history of worsening tachycardia and shortness of breath as well as cough.  Patient was tachycardic and hypotensive.  CT chest with contrast showed small focus of infarct in the spleen, small bilateral pleural effusions with partial compressive atelectasis of the lower lobes, interstitial and interlobular septal prominence consistent with edema.  EKG showed A-fib rate 143 bpm.  Plan for rate control strategy.  Patient discharged back to long-term care facility on 09/13/2022 on metoprolol, digoxin, and Eliquis.  Plan for TEE/DCCV as outpatient.  Today, patient ***  PAF/LAA thrombus.  CTA chest abdomen pelvis December 2023 showed large thrombus within the LAA.  EKG today *** Patient reports *** Continue metoprolol, digoxin, Eliquis. Mitral valve prolapse/chronic diastolic heart failure.  S/p minimally invasive mitral valve repair February 2013.  Echo December 2023 showed EF 60 to 65%, mild mitral stenosis, prosthetic annuloplasty ring present in the  mitral position.  Patient *** Euvolemic and well compensated on exam. *** History of astrocytoma brain tumor.  S/p resection and radiation 1971.  Residual ataxia and spasticity.  Patient ***  Home Medications    No outpatient medications have been marked as taking for the 10/14/22 encounter (Appointment) with Deberah Pelton, NP.    Family History    Family History  Problem Relation Age of Onset   Coronary artery disease Father 51       died MI   Hyperlipidemia Mother    Heart attack Maternal Grandfather    She indicated that the status of her mother is unknown. She indicated that her father is deceased. She indicated that her sister is alive. She indicated that the status of her maternal grandfather is unknown.   Social History    Social History   Socioeconomic History   Marital status: Single    Spouse name: Not on file   Number of children: 0   Years of education: Not on file   Highest education level: Not on file  Occupational History   Occupation: disabled  Tobacco Use   Smoking status: Former    Types: Cigarettes    Quit date: 09/09/1990    Years since quitting: 32.1   Smokeless tobacco: Never  Vaping Use   Vaping Use: Never used  Substance and Sexual Activity   Alcohol use: Not Currently    Alcohol/week: 0.0 standard drinks of alcohol    Comment: rarely   Drug use: No   Sexual activity: Not on file  Other Topics Concern   Not on file  Social History Narrative   [facility] in [facility].      Patient is right-handed. She is a permanent resident of [facility].     Social Determinants of Health   Financial Resource Strain: Low Risk  (01/22/2018)   Overall Financial Resource Strain (CARDIA)    Difficulty of Paying Living Expenses: Not hard at all  Food Insecurity: No Food Insecurity (01/22/2018)   Hunger Vital Sign    Worried About Running Out of Food in the Last Year: Never true    Ran Out of Food in the Last  Year: Never true  Transportation Needs: No Transportation Needs (01/22/2018)   PRAPARE - Hydrologist (Medical): No    Lack of Transportation (Non-Medical): No  Physical Activity: Inactive (01/22/2018)   Exercise Vital Sign    Days of Exercise per Week: 0 days    Minutes of Exercise per Session: 0 min  Stress: No Stress Concern Present (01/22/2018)   [place]    Feeling of Stress : Only a little  Social Connections: Moderately Isolated (01/22/2018)   Social Connection and Isolation Panel [NHANES]    Frequency of Communication with Friends and Family: Three times a week    Frequency of Social Gatherings with Friends and Family: Three times a week    Attends Religious Services: Never    Active Member of Clubs or Organizations: Not asked    Attends  Club or Organization Meetings: Never    Marital Status: Never married  Intimate Partner Violence: Not At Risk (01/22/2018)   Humiliation, Afraid, Rape, and Kick questionnaire    Fear of Current or Ex-Partner: No    Emotionally Abused: No    Physically Abused: No    Sexually Abused: No     Review of Systems    General: *** No chills, fever, night sweats or weight changes.  Cardiovascular:  No chest pain, dyspnea on exertion, edema, orthopnea, palpitations, paroxysmal nocturnal dyspnea. Dermatological: No rash, lesions/masses Respiratory: No cough, dyspnea Urologic: No hematuria, dysuria Abdominal:   No nausea, vomiting, diarrhea, bright red blood per rectum, melena, or hematemesis Neurologic:  No visual changes, weakness, changes in mental status. All other systems reviewed and are otherwise negative except as noted above.  Physical Exam    VS:  There were no vitals taken for this visit. , BMI There is no height or weight on file to calculate BMI. GEN: *** Well nourished, well developed, in no acute distress. HEENT: Normal. Neck: Supple, no  JVD, carotid bruits, or masses. Cardiac: RRR, no murmurs, rubs, or gallops. No clubbing, cyanosis, edema.  Radials/DP/PT 2+ and equal bilaterally.  Respiratory:  Respirations regular and unlabored, clear to auscultation bilaterally. GI: Soft, nontender, nondistended. MS: No deformity or atrophy. Skin: Warm and dry, no rash. Neuro: Strength and sensation are intact. Psych: Normal affect.  Accessory Clinical Findings    The following studies were reviewed for this visit: ***  Recent Labs: 09/05/2022: ALT 28; B Natriuretic Peptide 706.4 09/07/2022: Magnesium 2.2 09/11/2022: BUN 13; Creatinine, Ser 0.63; Potassium 3.6; Sodium 138 09/13/2022: Hemoglobin 10.1; Platelets 254   Recent Lipid Panel    Component Value Date/Time   CHOL 135 01/27/2019 0000   TRIG 41 01/27/2019 0000   HDL 42 01/27/2019 0000   CHOLHDL 4 11/06/2015 1652   VLDL 19.8 11/06/2015 1652   LDLCALC 85 01/27/2019 0000   LDLDIRECT 165.0 05/14/2013 1118    No BP recorded.  {Refresh Note OR Click here to enter BP  :1}***    ECG personally reviewed by me today: ***  No significant changes from ***  {Does this patient have ATRIAL FIBRILLATION?[phone]}   Assessment & Plan   ***      Disposition: ***   Justice Britain. Simmone Cape, DNP, NP-C     10/13/2022, 1:42 PM [facility] [place] [address] Office [phone] Fax [phone]

## 2022-10-14 ENCOUNTER — Ambulatory Visit: Payer: Medicare Other | Attending: General Practice | Admitting: General Practice

## 2024-03-23 NOTE — Unmapped External Note (Addendum)
 Speech Language Pathology Modified Barium Swallow Study  Michaela [name] 68 y.o. March 23, 2024  A Modified Barium Swallow Study (MBSS) was conducted on on this date for evaluation of swallow mechanism, warranted by overt signs and symptoms of aspiration noted bedside by family and patient report of choking with water. Past medical history also significant for brain tumor with associated debilitations. Per sister, patient has been having modified diets and thickened liquids intermittently, but choking episodes with water.   Medical Diagnosis: Dysphagia, oropharyngeal phase Treatment Diagnosis: Dysphagia, oropharyngeal phase Pain:  0/10 Patient Goal:  none stated  Family/Caregiver present for session: Yes Cultural/Spiritual Beliefs to Incorporate into Treatment Sessions:  No  IMPRESSION  Patient max assist with positioning, requires strict 1:1 assist with all intake. SLP administered all trials. Thin, mildly thick, and moderately thick liquids all trialed via straw with single and consecutive sips with patient demonstrating full airway protection and no penetration or aspiration on any consistency. Patient was in baseline positioning, per sister, which is leaning to left.  Mild oral dysphagia characterized by prolonged and effortful, but effective mastication. No significant pharyngeal residue, no esophageal stasis or retrograde movement visualized.     RECOMMENDATIONS Diet: Dysphagia Soft & Bite Sized with Thin Liquids Aspiration precautions:  General precautions: sit upright, slow rate, fully swallow before next bite/sip, only eat when awake/alert, sit upright during all PO (as close to 90 degrees as possible) sit upright 30 minutes after eating assist with all PO intake Medications: crushed, if permissible, with recommended consistencies  Additional Recommendations: Regular and thorough oral care, best practice is toothbrush and toothpaste Continue ST POC at  facility     EMR-MEDICAL HISTORY Reason for admission, as well as past and current medical history, has been reviewed and can be found in patient's medical record. Medical History[1]  PROCEDURE Study performed to assess the oral and pharyngeal phases of the swallow to assess physiology and aspiration risk. All test boluses administered were mixed or coated with barium sulfate for visualization. The study was recorded.  Current diet: No diet orders on file Mental Status: Alert Positioning: Leaning to left  Respiratory Status  O2 Device: None (Room air) O2 Flow Rate (L/min):   Test Boluses Consistencies provided: Thin Liquids, Mildly Thick Liquids, Moderately Thick Liquids, Pureed, and Regular Solid Liquids provided via: Straw Views: Lateral   FINDINGS  Oral Phase: Diminished mastication Swallow Initiation Phase: Timely Pharyngeal Phase: WFL Residue: Normal - no residue after the swallow Laryngeal Penetration Occurred with: N/A Laryngeal Penetration Was: N/A Aspiration Occurred With: N/A Aspiration Was: N/A Nasal regurgitation: No Esophageal Regurgitation into Hypopharynx: No Opening of the UES/Cricopharyngeus: WNL  Penetration-Aspiration Scale (PAS): Thin Liquid: 1 Puree: 1 Soft Solid: 1 Solid: 1  1 Material does not enter airway  2 Material enters the airway, remains above the vocal folds and is ejected from the airway  3 Material enters the airway, remains above the vocal folds and is not ejected from the airway  4 Material enters the airway, contacts the vocal folds and is ejected from the airway  5 Material enters the airway, contacts the vocal folds and is not ejected from the airway  6 Material enters the airway, passes below the vocal folds and is ejected into the larynx or out of the airway  7 Material enters the airway, passes below the vocal folds and is not ejected from the trachea despite effort  8 Material enters the airway, passes below the vocal  folds and no effort is  made to eject   Compensatory Strategies N/A  Prognosis: Guarded due to:, current medical status, etiology of deficits  Re-Evaluate: If clinically indicated   CPT Procedure 92526: Extensive education and training provided to patient on safe swallow strategies and impact of weakness and tremors on swallow function. Behavioral modifications were discussed and all patient's questions were answered. Verbal and written/virtual handouts were reviewed and provided. Discussion and explanation of safe swallow strategies and strict 1:1 assist.  Goals:   Goals Addressed             This Visit's Progress    SLP Goal   On track    Patient and/or caregiver, if present, will be able to provide teach back of strategies for safe swallowing and recite aspiration precautions with visual handout as reference.           Education:  Results of this evaluation were thoroughly discussed with the patient, family (if present), and relayed to referring provider.   If you have any questions regarding this patient, please notify the active/available SLP on patient's treatment team. Thank you for this referral. Andriette Porteous, MA, CCC-SLP, CLT    [place]           03/23/2024   Code Description Service Provider Modifiers Quantity  [id number] Hc St Oral Function Therapy Andriette SHAUNNA Porteous, SLP GN 1  [id number] Hc St Eval Swallow Function Cine/Video Record Jenna P Sturges, SLP GN 1        Time of Service Note Type Status  None          Time IN 900  Time OUT 945  Eval Min 30  Tx Min 15  Total Min 45           [1] Past Medical History: Diagnosis Date   Ataxia    Back pain    Cognitive communication deficit    Dysarthria and anarthria    Dysphagia    Headache    Hearing loss    High cholesterol    Hypertension    Joint pain    Malignant neoplasm of brain    (CMD)    Movement disorder    Muscle weakness    Neck pain    Neck stiffness     Photophobia    Seizures    (CMD)    Vascular disease    Vision abnormalities

## 2024-09-03 ENCOUNTER — Emergency Department (HOSPITAL_COMMUNITY)

## 2024-09-03 ENCOUNTER — Other Ambulatory Visit: Payer: Self-pay

## 2024-09-03 ENCOUNTER — Encounter (HOSPITAL_COMMUNITY): Payer: Self-pay | Admitting: Family Medicine

## 2024-09-03 ENCOUNTER — Inpatient Hospital Stay (HOSPITAL_COMMUNITY)
Admission: EM | Admit: 2024-09-03 | Discharge: 2024-09-08 | DRG: 689 | Disposition: A | Discharge status: Skilled Nursing Facility | Source: Skilled Nursing Facility

## 2024-09-03 DIAGNOSIS — D638 Anemia in other chronic diseases classified elsewhere: Secondary | ICD-10-CM | POA: Diagnosis present

## 2024-09-03 DIAGNOSIS — G939 Disorder of brain, unspecified: Secondary | ICD-10-CM

## 2024-09-03 DIAGNOSIS — R4189 Other symptoms and signs involving cognitive functions and awareness: Secondary | ICD-10-CM | POA: Diagnosis present

## 2024-09-03 DIAGNOSIS — R29818 Other symptoms and signs involving the nervous system: Secondary | ICD-10-CM | POA: Diagnosis present

## 2024-09-03 DIAGNOSIS — Z982 Presence of cerebrospinal fluid drainage device: Secondary | ICD-10-CM

## 2024-09-03 DIAGNOSIS — I4821 Permanent atrial fibrillation: Secondary | ICD-10-CM | POA: Diagnosis present

## 2024-09-03 DIAGNOSIS — Z8249 Family history of ischemic heart disease and other diseases of the circulatory system: Secondary | ICD-10-CM | POA: Diagnosis not present

## 2024-09-03 DIAGNOSIS — G9340 Encephalopathy, unspecified: Secondary | ICD-10-CM | POA: Diagnosis present

## 2024-09-03 DIAGNOSIS — Z87891 Personal history of nicotine dependence: Secondary | ICD-10-CM

## 2024-09-03 DIAGNOSIS — R4182 Altered mental status, unspecified: Principal | ICD-10-CM

## 2024-09-03 DIAGNOSIS — G911 Obstructive hydrocephalus: Secondary | ICD-10-CM | POA: Diagnosis present

## 2024-09-03 DIAGNOSIS — B961 Klebsiella pneumoniae [K. pneumoniae] as the cause of diseases classified elsewhere: Secondary | ICD-10-CM | POA: Diagnosis present

## 2024-09-03 DIAGNOSIS — Z7901 Long term (current) use of anticoagulants: Secondary | ICD-10-CM | POA: Diagnosis not present

## 2024-09-03 DIAGNOSIS — E119 Type 2 diabetes mellitus without complications: Secondary | ICD-10-CM | POA: Diagnosis present

## 2024-09-03 DIAGNOSIS — G9341 Metabolic encephalopathy: Secondary | ICD-10-CM | POA: Diagnosis present

## 2024-09-03 DIAGNOSIS — I959 Hypotension, unspecified: Secondary | ICD-10-CM | POA: Diagnosis not present

## 2024-09-03 DIAGNOSIS — Z85841 Personal history of malignant neoplasm of brain: Secondary | ICD-10-CM | POA: Diagnosis not present

## 2024-09-03 DIAGNOSIS — Z1152 Encounter for screening for COVID-19: Secondary | ICD-10-CM

## 2024-09-03 DIAGNOSIS — I110 Hypertensive heart disease with heart failure: Secondary | ICD-10-CM | POA: Diagnosis present

## 2024-09-03 DIAGNOSIS — Z888 Allergy status to other drugs, medicaments and biological substances status: Secondary | ICD-10-CM

## 2024-09-03 DIAGNOSIS — Z923 Personal history of irradiation: Secondary | ICD-10-CM

## 2024-09-03 DIAGNOSIS — E785 Hyperlipidemia, unspecified: Secondary | ICD-10-CM | POA: Diagnosis present

## 2024-09-03 DIAGNOSIS — F419 Anxiety disorder, unspecified: Secondary | ICD-10-CM | POA: Diagnosis present

## 2024-09-03 DIAGNOSIS — Z66 Do not resuscitate: Secondary | ICD-10-CM | POA: Diagnosis present

## 2024-09-03 DIAGNOSIS — Z8673 Personal history of transient ischemic attack (TIA), and cerebral infarction without residual deficits: Secondary | ICD-10-CM

## 2024-09-03 DIAGNOSIS — Z79899 Other long term (current) drug therapy: Secondary | ICD-10-CM

## 2024-09-03 DIAGNOSIS — I5032 Chronic diastolic (congestive) heart failure: Secondary | ICD-10-CM | POA: Diagnosis present

## 2024-09-03 DIAGNOSIS — G919 Hydrocephalus, unspecified: Secondary | ICD-10-CM

## 2024-09-03 DIAGNOSIS — R601 Generalized edema: Secondary | ICD-10-CM

## 2024-09-03 DIAGNOSIS — I4891 Unspecified atrial fibrillation: Secondary | ICD-10-CM | POA: Diagnosis not present

## 2024-09-03 DIAGNOSIS — N3000 Acute cystitis without hematuria: Secondary | ICD-10-CM | POA: Diagnosis present

## 2024-09-03 DIAGNOSIS — Z993 Dependence on wheelchair: Secondary | ICD-10-CM

## 2024-09-03 LAB — RESP PANEL BY RT-PCR (RSV, FLU A&B, COVID)  RVPGX2
Influenza A by PCR: NEGATIVE
Influenza B by PCR: NEGATIVE
Resp Syncytial Virus by PCR: NEGATIVE
SARS Coronavirus 2 by RT PCR: NEGATIVE

## 2024-09-03 LAB — CBC WITH DIFFERENTIAL/PLATELET
Abs Immature Granulocytes: 0.04 K/uL (ref 0.00–0.07)
Basophils Absolute: 0 K/uL (ref 0.0–0.1)
Basophils Relative: 1 %
Eosinophils Absolute: 0.2 K/uL (ref 0.0–0.5)
Eosinophils Relative: 2 %
HCT: 36.1 % (ref 36.0–46.0)
Hemoglobin: 11 g/dL — ABNORMAL LOW (ref 12.0–15.0)
Immature Granulocytes: 1 %
Lymphocytes Relative: 25 %
Lymphs Abs: 2.2 K/uL (ref 0.7–4.0)
MCH: 28.3 pg (ref 26.0–34.0)
MCHC: 30.5 g/dL (ref 30.0–36.0)
MCV: 92.8 fL (ref 80.0–100.0)
Monocytes Absolute: 0.7 K/uL (ref 0.1–1.0)
Monocytes Relative: 8 %
Neutro Abs: 5.7 K/uL (ref 1.7–7.7)
Neutrophils Relative %: 63 %
Platelets: 232 K/uL (ref 150–400)
RBC: 3.89 MIL/uL (ref 3.87–5.11)
RDW: 14.8 % (ref 11.5–15.5)
WBC: 8.8 K/uL (ref 4.0–10.5)
nRBC: 0 % (ref 0.0–0.2)

## 2024-09-03 LAB — URINALYSIS, ROUTINE W REFLEX MICROSCOPIC
Bilirubin Urine: NEGATIVE
Glucose, UA: NEGATIVE mg/dL
Ketones, ur: NEGATIVE mg/dL
Nitrite: POSITIVE — AB
Protein, ur: 100 mg/dL — AB
RBC / HPF: >50 RBC/hpf (ref 0–5)
Specific Gravity, Urine: 1.024 (ref 1.005–1.030)
WBC, UA: >50 WBC/hpf (ref 0–5)
pH: 5 (ref 5.0–8.0)

## 2024-09-03 LAB — COMPREHENSIVE METABOLIC PANEL WITH GFR
ALT: 9 U/L (ref 0–44)
AST: 15 U/L (ref 15–41)
Albumin: 3.7 g/dL (ref 3.5–5.0)
Alkaline Phosphatase: 125 U/L (ref 38–126)
Anion gap: 9 (ref 5–15)
BUN: 23 mg/dL (ref 8–23)
CO2: 25 mmol/L (ref 22–32)
Calcium: 8.8 mg/dL — ABNORMAL LOW (ref 8.9–10.3)
Chloride: 107 mmol/L (ref 98–111)
Creatinine, Ser: 0.73 mg/dL (ref 0.44–1.00)
GFR, Estimated: >60 mL/min
Glucose, Bld: 89 mg/dL (ref 70–99)
Potassium: 4.3 mmol/L (ref 3.5–5.1)
Sodium: 140 mmol/L (ref 135–145)
Total Bilirubin: 0.6 mg/dL (ref 0.0–1.2)
Total Protein: 6.4 g/dL — ABNORMAL LOW (ref 6.5–8.1)

## 2024-09-03 LAB — MAGNESIUM: Magnesium: 1.8 mg/dL (ref 1.7–2.4)

## 2024-09-03 LAB — TSH: TSH: 2.73 u[IU]/mL (ref 0.350–4.500)

## 2024-09-03 LAB — PRO BRAIN NATRIURETIC PEPTIDE: Pro Brain Natriuretic Peptide: 4978 pg/mL — ABNORMAL HIGH

## 2024-09-03 LAB — CBG MONITORING, ED: Glucose-Capillary: 93 mg/dL (ref 70–99)

## 2024-09-03 MED ORDER — SODIUM CHLORIDE 0.9 % IV SOLN
2.0000 g | Freq: Once | INTRAVENOUS | Status: AC
  Administered 2024-09-03: 2 g via INTRAVENOUS
  Filled 2024-09-03: qty 20

## 2024-09-03 MED ORDER — DOCUSATE SODIUM 100 MG PO CAPS
100.0000 mg | ORAL_CAPSULE | Freq: Every day | ORAL | Status: DC
  Administered 2024-09-04 – 2024-09-08 (×5): 100 mg via ORAL
  Filled 2024-09-03 (×2): qty 1

## 2024-09-03 MED ORDER — MELATONIN 3 MG PO TABS
3.0000 mg | ORAL_TABLET | Freq: Every day | ORAL | Status: DC
  Administered 2024-09-03 – 2024-09-07 (×5): 3 mg via ORAL
  Filled 2024-09-03 (×4): qty 1

## 2024-09-03 MED ORDER — DILTIAZEM HCL-DEXTROSE 125-5 MG/125ML-% IV SOLN (PREMIX)
5.0000 mg/h | INTRAVENOUS | Status: DC
  Administered 2024-09-03: 5 mg/h via INTRAVENOUS
  Administered 2024-09-04: 12.5 mg/h via INTRAVENOUS
  Filled 2024-09-03 (×2): qty 125

## 2024-09-03 MED ORDER — ALBUTEROL SULFATE (2.5 MG/3ML) 0.083% IN NEBU: 3.0000 mL | INHALATION_SOLUTION | Freq: Four times a day (QID) | RESPIRATORY_TRACT | Status: DC | PRN

## 2024-09-03 MED ORDER — SODIUM CHLORIDE 0.9 % IV SOLN
1.0000 g | INTRAVENOUS | Status: DC
  Administered 2024-09-05 – 2024-09-08 (×5): 1 g via INTRAVENOUS
  Filled 2024-09-03 (×3): qty 10

## 2024-09-03 MED ORDER — ONDANSETRON HCL 4 MG/2ML IJ SOLN: 4.0000 mg | Freq: Four times a day (QID) | INTRAMUSCULAR | Status: DC | PRN

## 2024-09-03 MED ORDER — SODIUM CHLORIDE 0.9% FLUSH
3.0000 mL | Freq: Two times a day (BID) | INTRAVENOUS | Status: DC
  Administered 2024-09-03 – 2024-09-08 (×8): 3 mL via INTRAVENOUS

## 2024-09-03 MED ORDER — POLYVINYL ALCOHOL 1.4 % OP SOLN
1.0000 [drp] | Freq: Four times a day (QID) | OPHTHALMIC | Status: DC
  Administered 2024-09-03 – 2024-09-08 (×17): 1 [drp] via OPHTHALMIC
  Filled 2024-09-03: qty 15

## 2024-09-03 MED ORDER — ACETAMINOPHEN 325 MG PO TABS: 650.0000 mg | ORAL_TABLET | Freq: Four times a day (QID) | ORAL | Status: DC | PRN

## 2024-09-03 MED ORDER — CLONAZEPAM 0.5 MG PO TABS
0.5000 mg | ORAL_TABLET | Freq: Two times a day (BID) | ORAL | Status: DC
  Administered 2024-09-04 – 2024-09-08 (×8): 0.5 mg via ORAL
  Filled 2024-09-03 (×4): qty 1

## 2024-09-03 MED ORDER — PANTOPRAZOLE SODIUM 40 MG PO TBEC
40.0000 mg | DELAYED_RELEASE_TABLET | Freq: Every day | ORAL | Status: DC
  Administered 2024-09-04 – 2024-09-08 (×5): 40 mg via ORAL
  Filled 2024-09-03 (×2): qty 1

## 2024-09-03 MED ORDER — FUROSEMIDE 10 MG/ML IJ SOLN
40.0000 mg | Freq: Once | INTRAMUSCULAR | Status: AC
  Administered 2024-09-03: 40 mg via INTRAVENOUS
  Filled 2024-09-03: qty 4

## 2024-09-03 MED ORDER — ACETAMINOPHEN 650 MG RE SUPP: 650.0000 mg | Freq: Four times a day (QID) | RECTAL | Status: DC | PRN

## 2024-09-03 MED ORDER — SENNOSIDES-DOCUSATE SODIUM 8.6-50 MG PO TABS: 1.0000 | ORAL_TABLET | Freq: Every evening | ORAL | Status: DC | PRN

## 2024-09-03 MED ORDER — CLONAZEPAM 0.5 MG PO TABS: 0.5000 mg | ORAL_TABLET | ORAL | Status: DC

## 2024-09-03 MED ORDER — CLONAZEPAM 0.125 MG PO TBDP
0.2500 mg | ORAL_TABLET | Freq: Every day | ORAL | Status: DC
  Administered 2024-09-03 – 2024-09-07 (×5): 0.25 mg via ORAL
  Filled 2024-09-03 (×3): qty 2

## 2024-09-03 MED ORDER — DIGOXIN 0.0625 MG HALF TABLET
0.0625 mg | ORAL_TABLET | Freq: Every day | ORAL | Status: DC
  Administered 2024-09-04 – 2024-09-08 (×5): 0.0625 mg via ORAL
  Filled 2024-09-03 (×2): qty 1

## 2024-09-03 MED ORDER — MIRABEGRON ER 25 MG PO TB24
25.0000 mg | ORAL_TABLET | Freq: Every day | ORAL | Status: DC
  Administered 2024-09-04 – 2024-09-08 (×5): 25 mg via ORAL
  Filled 2024-09-03 (×3): qty 1

## 2024-09-03 MED ORDER — DILTIAZEM LOAD VIA INFUSION: 15.0000 mg | Freq: Once | INTRAVENOUS | Status: DC

## 2024-09-03 MED ORDER — ATORVASTATIN CALCIUM 10 MG PO TABS
20.0000 mg | ORAL_TABLET | Freq: Every day | ORAL | Status: DC
  Administered 2024-09-04 – 2024-09-08 (×5): 20 mg via ORAL
  Filled 2024-09-03 (×2): qty 2

## 2024-09-03 MED ORDER — POLYETHYLENE GLYCOL 3350 17 G PO PACK
17.0000 g | PACK | Freq: Every day | ORAL | Status: DC
  Administered 2024-09-06 – 2024-09-08 (×3): 17 g via ORAL
  Filled 2024-09-03: qty 1

## 2024-09-03 MED ORDER — DILTIAZEM HCL 25 MG/5ML IV SOLN
15.0000 mg | Freq: Once | INTRAVENOUS | Status: AC
  Administered 2024-09-03: 15 mg via INTRAVENOUS
  Filled 2024-09-03: qty 5

## 2024-09-03 MED ORDER — ESCITALOPRAM OXALATE 10 MG PO TABS
10.0000 mg | ORAL_TABLET | Freq: Every day | ORAL | Status: DC
  Administered 2024-09-04 – 2024-09-08 (×5): 10 mg via ORAL
  Filled 2024-09-03 (×2): qty 1

## 2024-09-03 NOTE — ED Triage Notes (Signed)
 Bib [facility] from [place] at [facility] for staff concerns of SOB x2 days and elevated heart rate. H/o afib. Upon EMS arrival, heart rate between 130-150. 10 mg IV cardizem  given en route. Post-medication vitals: heart rate 70-100, 102/58, 100% on 2L. Alert and oriented to baseline according to EMS.

## 2024-09-03 NOTE — H&P (Signed)
 " History and Physical    Michaela [name] FMW:[id number] DOB: May 13, 1956 DOA: 09/03/2024  PCP: [place]   Patient coming from: SNF   Chief Complaint: AMS, rapid HR    HPI: Michaela [name] is a 68 y.o. 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  Patient is wheelchair-bound and requires assistance with ADLs at her baseline, has had progressive cognitive and functional decline over the past 2 years per report of her sister at the bedside, but her confusion has worsened significantly over the past 2 weeks, and particularly over the past 24 hours.  She appeared to be short of breath today, heart rate was noted to be elevated as high as the 150s, and EMS was called.  She was found to have heart rate in the 130s to 150s with EMS and was treated with 10 mg IV diltiazem  prior to arrival in the ED.    ED Course: Upon arrival to the ED, patient is found to be afebrile and saturating mid 90s on room air with normal RR, elevated HR, and stable BP.  Labs are most notable for normal creatinine, normal WBC, bacteriuria, pyuria, and urine nitrates.  Neurosurgery was consulted by the ED physician, urine culture was ordered, and the patient was treated with IV diltiazem  and Rocephin .  Review of Systems:  ROS limited by patient's clinical condition.  Past Medical History:  Diagnosis Date   Astrocytoma brain tumor (HCC) 1971   astrocytoma treated with radiation   C. difficile colitis 11/2011   complicated by sepsis/ARDS and prolonged hosp   Depression    Hyperlipidemia    Mitral regurgitation 10/2011   severe, s/p min invasive repair and annuloplasty    Mitral valve prolapse    Neuromuscular disorder (HCC)    numbness in hands and feet   Osteoarthritis    Osteoporosis    DEXA @ [facility] 11/2012: -3.5, prior tx Reclast, last in 2011, rec change to Prolia    Overactive bladder    Pleural effusion, left 11/11/2011   Vitamin D  deficiency     Past Surgical History:  Procedure Laterality Date   CARDIAC CATHETERIZATION     Jan 2013   KNEE ARTHROSCOPY Right    MITRAL VALVE REPAIR  10/15/2011   Procedure: MINIMALLY INVASIVE MITRAL VALVE REPAIR (MVR);  Surgeon: Sudie VEAR Laine, MD;  Location: [facility] OR;  Service: Open Heart Surgery;  Laterality: Right;   TRANSESOPHAGEAL ECHOCARDIOGRAM  7/12   US  ECHOCARDIOGRAPHY  6/12   VENTRICULOPERITONEAL SHUNT  1971    Social History:   reports that she quit smoking about 34 years ago. She has never used smokeless tobacco. She reports that she does not currently use alcohol . She reports that she does not use drugs.  Allergies[1]  Family History  Problem Relation Age of Onset   Coronary artery disease Father 48       died MI   Hyperlipidemia Mother    Heart attack Maternal Grandfather      Prior to Admission medications  Medication Sig Start Date End Date Taking? Authorizing Provider  albuterol  (ACCUNEB ) 1.25 MG/3ML nebulizer solution Take 1 ampule by nebulization every 6 (six) hours as needed for wheezing.   Yes [provider]  apixaban  (ELIQUIS ) 5 MG TABS  tablet Take 1 tablet (5 mg total) by mouth 2 (two) times daily. 09/13/22  Yes Dahal, Chapman, MD  atorvastatin  (LIPITOR) 20 MG tablet Take 20 mg by mouth daily.    Yes [provider]  cholecalciferol (VITAMIN D ) 1000 units tablet Take 1,000 Units by mouth daily.   Yes [provider]  clonazePAM  (KLONOPIN ) 0.5 MG tablet Take 0.25-0.5 mg by mouth in the morning, at noon, and at bedtime. Give 0.5 mg BID & Give 0.25 mg at bedtime. Hold if sedated   Yes [provider]  digoxin  62.5 MCG TABS Take 0.0625 mg by mouth daily. 09/14/22   Yes Dahal, Chapman, MD  docusate sodium  (COLACE) 100 MG capsule Take 100 mg by mouth daily.   Yes [provider]  escitalopram  (LEXAPRO ) 10 MG tablet Take 10 mg by mouth daily.   Yes [provider]  loratadine (CLARITIN) 10 MG tablet Take 10 mg by mouth daily.   Yes [provider]  melatonin 3 MG TABS tablet Take 3 mg by mouth at bedtime.   Yes [provider]  Menthol, Topical Analgesic, (BIOFREEZE) 4 % GEL Apply 1 application  topically 2 (two) times daily as needed. Apply topically BID to sore areas - back   Yes [provider]  Menthol-Zinc Oxide (CALMOSEPTINE) 0.44-20.6 % OINT Apply 1 Application topically 2 (two) times daily. Apply to buttocks   Yes [provider]  metoprolol  tartrate (LOPRESSOR ) 25 MG tablet Take 1 tablet (25 mg total) by mouth 2 (two) times daily. Patient taking differently: Take 12.5 mg by mouth 2 (two) times daily. Hold for SBP<=100 09/13/22  Yes Dahal, Chapman, MD  MYRBETRIQ  25 MG TB24 tablet Take 25 mg by mouth daily.   Yes [provider]  omeprazole (PRILOSEC) 20 MG capsule Take 20 mg by mouth daily.   Yes [provider]  polyethylene glycol (MIRALAX  / GLYCOLAX ) 17 g packet Take 17 g by mouth daily.   Yes [provider]  polyvinyl alcohol  (LIQUIFILM TEARS) 1.4 % ophthalmic solution Place 1 drop into both eyes 4 (four) times daily.    Yes [provider]  levalbuterol  (XOPENEX ) 0.63 MG/3ML nebulizer solution Take 3 mLs (0.63 mg total) by nebulization every 6 (six) hours as needed for wheezing or shortness of breath. 09/13/22 09/13/23  Arlice Chapman, MD  OXYGEN Inhale 2 L into the lungs daily as needed (if needed for shortness of breath).    [provider]    Physical Exam: Vitals:   09/03/24 1930 09/03/24 2000 09/03/24 2030 09/03/24 2100  BP: 112/80 119/86 113/69 118/89  Pulse: (!) 130 (!) 133 73 91  Resp: 15 16 15 13   Temp:      TempSrc:      SpO2: 95% 95% 96% 92%      Constitutional: NAD, no diaphoresis   Eyes: PERTLA, lids and conjunctivae normal ENMT: Mucous membranes are moist. Posterior pharynx clear of any exudate or lesions.   Neck: supple, no masses  Respiratory: no wheezing, no crackles. No accessory muscle use.  Cardiovascular: Rate ~120 and irregularly irregular. Pretibial pitting edema.   Abdomen: No tenderness, soft. Bowel sounds active.  Musculoskeletal: no clubbing / cyanosis. Contractures.   Skin: no significant rashes, lesions, ulcers. Warm, dry, well-perfused. Neurologic: Awake, oriented to person, place, and situation. Dysarthria. Gross hearing deficit. Moving all extremities.    Psychiatric: Calm. Cooperative.    Labs and Imaging on Admission: I have personally reviewed following labs and imaging studies  CBC:  Recent Labs  Lab 09/03/24 1759  WBC 8.8  NEUTROABS 5.7  HGB 11.0*  HCT 36.1  MCV 92.8  PLT 232   Basic Metabolic Panel: Recent Labs  Lab 09/03/24 1759  NA 140  K 4.3  CL 107  CO2 25  GLUCOSE 89  BUN 23  CREATININE 0.73  CALCIUM  8.8*  MG 1.8   GFR: CrCl cannot be calculated (Unknown ideal weight.). Liver Function Tests: Recent Labs  Lab 09/03/24 1759  AST 15  ALT 9  ALKPHOS 125  BILITOT 0.6  PROT 6.4*  ALBUMIN  3.7   No results for input(s): LIPASE, AMYLASE in the last 168 hours. No results for input(s): AMMONIA in the last 168 hours. Coagulation Profile: No results for input(s): INR, PROTIME in the last 168 hours. Cardiac Enzymes: No results for input(s): CKTOTAL, CKMB, CKMBINDEX, TROPONINI in the last 168 hours. BNP (last 3 results) Recent Labs    09/03/24 1759  PROBNP 4,978.0*   HbA1C: No results for input(s): HGBA1C in the last 72 hours. CBG: Recent Labs  Lab 09/03/24 1750  GLUCAP 93   Lipid Profile: No results for input(s): CHOL, HDL, LDLCALC, TRIG, CHOLHDL, LDLDIRECT in the last 72 hours. Thyroid  Function Tests: Recent Labs     09/03/24 1759  TSH 2.730   Anemia Panel: No results for input(s): VITAMINB12, FOLATE, FERRITIN, TIBC, IRON , RETICCTPCT in the last 72 hours. Urine analysis:    Component Value Date/Time   COLORURINE YELLOW 09/03/2024 2105   APPEARANCEUR TURBID (A) 09/03/2024 2105   LABSPEC 1.024 09/03/2024 2105   PHURINE 5.0 09/03/2024 2105   GLUCOSEU NEGATIVE 09/03/2024 2105   GLUCOSEU NEGATIVE 06/29/2015 1501   HGBUR MODERATE (A) 09/03/2024 2105   BILIRUBINUR NEGATIVE 09/03/2024 2105   BILIRUBINUR negative 10/04/2016 1110   KETONESUR NEGATIVE 09/03/2024 2105   PROTEINUR 100 (A) 09/03/2024 2105   UROBILINOGEN 0.2 10/04/2016 1110   UROBILINOGEN 0.2 06/29/2015 1501   NITRITE POSITIVE (A) 09/03/2024 2105   LEUKOCYTESUR LARGE (A) 09/03/2024 2105   Sepsis Labs: @LABRCNTIP (procalcitonin:4,lacticidven:4) ) Recent Results (from the past 240 hours)  Resp panel by RT-PCR (RSV, Flu A&B, Covid) Anterior Nasal Swab     Status: None   Collection Time: 09/03/24  6:28 PM   Specimen: Anterior Nasal Swab  Result Value Ref Range Status   SARS Coronavirus 2 by RT PCR NEGATIVE NEGATIVE Final   Influenza A by PCR NEGATIVE NEGATIVE Final   Influenza B by PCR NEGATIVE NEGATIVE Final    Comment: (NOTE) The Xpert Xpress SARS-CoV-2/FLU/RSV plus assay is intended as an aid in the diagnosis of influenza from Nasopharyngeal swab specimens and should not be used as a sole basis for treatment. Nasal washings and aspirates are unacceptable for Xpert Xpress SARS-CoV-2/FLU/RSV testing.  Fact Sheet for Patients: bloggercourse.com  Fact Sheet for Healthcare Providers: seriousbroker.it  This test is not yet approved or cleared by the [place]  FDA and has been authorized for detection and/or diagnosis of SARS-CoV-2 by FDA under an Emergency Use Authorization (EUA). This EUA will remain in effect (meaning this test can be used) for the duration of  the COVID-19 declaration under Section 564(b)(1) of the Act, 21 U.S.C. section 360bbb-3(b)(1), unless the authorization is terminated or revoked.     Resp Syncytial Virus by PCR NEGATIVE NEGATIVE Final    Comment: (NOTE) Fact Sheet for Patients: bloggercourse.com  Fact Sheet for Healthcare Providers: seriousbroker.it  This test is not yet approved or cleared by the [place]  FDA and has been authorized for detection  and/or diagnosis of SARS-CoV-2 by FDA under an Emergency Use Authorization (EUA). This EUA will remain in effect (meaning this test can be used) for the duration of the COVID-19 declaration under Section 564(b)(1) of the Act, 21 U.S.C. section 360bbb-3(b)(1), unless the authorization is terminated or revoked.  Performed at [facility], [address] [place], [place] [address]      Radiological Exams on Admission: DG Abd 1 View Result Date: 09/03/2024 EXAM: 1 VIEW XRAY OF THE ABDOMEN 09/03/2024 07:18:00 PM COMPARISON: Cervical spine x-ray and chest x-ray 09/03/2024 CLINICAL HISTORY: evaluate for shunt malfunction FINDINGS: LINES, TUBES AND DEVICES: Both VP shunts course below in the esophagus with the tip overlying the abdomen. BOWEL: Nonobstructive bowel gas pattern. SOFT TISSUES: 2 metallic clips overlying the right upper quadrant. Mitral annular calcification status post repair. BONES: No acute fracture. LUNGS/PLEURAL SPACES: Patchy airspace opacity of the left lower lung zone with likely trace left pleural effusion. IMPRESSION: 1. Ventriculoperitoneal shunt catheters project below the diaphragm with distal tips overlying the abdomen. 2. Nonobstructive bowel gas pattern. 3. Patchy left lower lung airspace opacity with likely trace left pleural effusion. Electronically signed by: Morgane Naveau MD 09/03/2024 09:31 PM EST RP Workstation: [id number]   DG Skull 1-3 Views Result Date: 09/03/2024 EXAM: 3 VIEW(S)  XRAY OF THE SKULL 09/03/2024 07:18:00 PM COMPARISON: None available. CLINICAL HISTORY: evaluate for shunt malfunction FINDINGS: BONES: No acute fracture. SINUSES: Unremarkable. SOFT TISSUES: Two right ventriculoperitoneal (VP) shunts are present. One shunt, with a frontal approach, correlates with the medial most shunt noted on the cervical spine x-ray. The second shunt, likely an abandoned posterior approach right VP shunt, correlates with the lateral most noted shunt on cervical spine x-ray and is kinked and calcified in the neck. IMPRESSION: 1. Two right VP shunts with a frontal approach shunt that correlates with the medial most shunt noted on the cervical spine x-ray. 2. Likely abandoned posterior approach right VP shunt that correlates with the lateral most noted shunt on cervical spine x-ray that is kinked and calcified in the neck. Electronically signed by: Morgane Naveau MD 09/03/2024 09:29 PM EST RP Workstation: [id number]   DG Cervical Spine 1 View Result Date: 09/03/2024 EXAM: 1 VIEW(S) XRAY OF THE CERVICAL SPINE 09/03/2024 07:18:00 PM COMPARISON: None available. CLINICAL HISTORY: AMS (altered mental status). FINDINGS: BONES: Vertebral body heights are maintained. Alignment is normal. DISCS AND DEGENERATIVE CHANGES: No severe degenerative changes. SOFT TISSUES: No prevertebral soft tissue swelling. Two VP shunts are noted. A more lateral right neck shunt demonstrates possible kinking and calcification along the course of the tube. The more medial tube overlying the neck and chest appears continuous with no kinking. The visualized lungs appear clear. IMPRESSION: 1. Two VP shunts are noted. A more lateral right neck shunt demonstrates possible kinking and calcification along the course of the tube. The more medial tube overlying the neck and chest appears continuous with no kinking. Electronically signed by: Morgane Naveau MD 09/03/2024 09:25 PM EST RP Workstation: [id number]   CT Head Wo  Contrast Result Date: 09/03/2024 EXAM: CT HEAD WITHOUT CONTRAST 09/03/2024 07:54:45 PM TECHNIQUE: CT of the head was performed without the administration of intravenous contrast. Automated exposure control, iterative reconstruction, and/or weight based adjustment of the mA/kV was utilized to reduce the radiation dose to as low as reasonably achievable. COMPARISON: CT Head dated 09/05/2022 and MRI dated 08/27/2018. CLINICAL HISTORY: Mental status change, unknown cause. FINDINGS: BRAIN AND VENTRICLES: Right frontal and right parietal approach ventriculostomy catheters in place.  Similar postoperative changes of the posterior fossa with areas of dystrophic calcifications along the margin of the dilated 4th ventricle. Similar appearance of atrophy and encephalomalacia throughout the cerebellum. Interval increase in size of the lateral ventricles compared to the 2023 CT. The third ventricle is also increased in caliber compared to prior. Balloond and intrapped appearance of the fourth ventricle is similar to prior. No midline shift. Similar appearance of generalized cerebral volume loss. Left parieto-occipital chronic infarct. Similar encephalomalacia in the right frontal lobe along the ventriculostomy catheter tract. Masslike soft tissue along the dorsal aspect of the clivus which is slightly increased in size compared to the 2023 CT and is significantly increased in size compared to the MRI from 08/27/2018. The soft tissue now measures up to 1.8 x 1.0 cm best seen on sagittal series 6 image 30. There is indentation of the ventral pons. Recommend contrast enhanced MRI of the brain for further evaluation. No acute hemorrhage. No evidence of acute infarct. No extra-axial collection. ORBITS: No acute abnormality. SINUSES: No acute abnormality. SOFT TISSUES AND SKULL: Postsurgical changes of suboccipital craniectomy. No acute soft tissue abnormality. No skull fracture. IMPRESSION: 1. Interval increase in size of the  lateral and third ventricles compared to the 2023 CT. Similar post surgical changes of the posterior fossa and entrapped appearance of the fourth ventricle. 2. Masslike soft tissue along the dorsal aspect of the clivus is slightly increased in size compared to the 2023 CT and significantly increased in size compared to the MRI from 08/27/2018, now measuring up to 1.8 x 1.0 cm with indentation of the ventral pons. Recommend contrast enhanced MRI of the brain for further evaluation. Electronically signed by: Donnice Mania MD 09/03/2024 08:51 PM EST RP Workstation: [id number]   DG Chest Portable 1 View Result Date: 09/03/2024 EXAM: 1 VIEW(S) XRAY OF THE CHEST 09/03/2024 06:08:00 PM COMPARISON: None available. CLINICAL HISTORY: SOB FINDINGS: LINES, TUBES AND DEVICES: VP shunt tubing traverses right chest wall. LUNGS AND PLEURA: Mildly low lung volumes. There are some interstitial opacities in both lower lungs which may represent atelectasis. Small left pleural effusion. No pneumothorax. HEART AND MEDIASTINUM: Upper normal heart size. Mitral annular calcification. Aortic atherosclerosis (ICD10-I70.0). BONES AND SOFT TISSUES: No acute osseous abnormality. IMPRESSION: 1. Small left pleural effusion. 2. Mild bibasilar interstitial opacities, favored atelectasis. Electronically signed by: Greig Pique MD 09/03/2024 07:26 PM EST RP Workstation: [id number]    EKG: Independently reviewed. Atrial fibrillation with RVR, rate 121, RAD.   Assessment/Plan   1. Acute encephalopathy  - Presents with increased confusion    - TSH is normal in ED; this is likely from UTI; increased ventriculomegaly noted on CT and could also be contributing - Treat UTI, follow-up on neurosurgery recommendations   2. Atrial fibrillation with RVR - Continue IV diltiazem  for now, hold Eliquis  pending neurosurgery consultation    3. UTI  - Continue Rocephin , follow culture and clinical course    4. Hydrocephalus  - S/p VP shunt  placement   - Interval increase in lateral and 3rd ventricles noted on CT in ED  - Follow-up on neurosurgery recommendations   5. Anxiety  - Continue Lexapro  and Klonopin      DVT prophylaxis: SCDs  Code Status: DNR/DNI  Level of Care: Level of care: Progressive Family Communication: Sister at bedside   Disposition Plan:  Patient is from: SNF  Anticipated d/c is to: SNF  Anticipated d/c date is: 09/06/24  Patient currently: Pending neurosurgery consultation, improved mental status, rate-control  Consults called:  Neurosurgery  Admission status: Inpatient     Evalene GORMAN Sprinkles, MD [facility]  09/03/2024, 10:21 PM       [1]  Allergies Allergen Reactions   Compazine Other (See Comments)    eyes get buggy   Prochlorperazine    Prochlorperazine Edisylate    Prochlorperazine Maleate    "

## 2024-09-03 NOTE — ED Notes (Signed)
 This RN introduced self to pt.  Pt is fully oriented at this time able to states name, state she is in hospital, states the current month, and states she is in the hospital due to fast heart rate.    RN messaged MD in regards to current HR to clarify current goal.  No response as of this time.

## 2024-09-03 NOTE — ED Provider Notes (Signed)
 " [place] [facility] Provider Note   HPI/ROS    History obtained from patient and family member.  Michaela [name] is a 68 y.o. female who presents for Shortness of Breath and who  has a past medical history of Astrocytoma brain tumor (HCC) (1971), C. difficile colitis (11/2011), Depression, Hyperlipidemia, Mitral regurgitation (10/2011), Mitral valve prolapse, Neuromuscular disorder (HCC), Osteoarthritis, Osteoporosis, Overactive bladder, Pleural effusion, left (11/11/2011), and Vitamin D  deficiency.  Patient presents today from facility for concerns for shortness of breath and tachycardia for the last 2 days.  Has a history of A-fib.  When EMS got to the facility today she had heart rate between 130s and 150s.  They state that her blood pressure was low at the facility.  They gave 10 mg of Cardizem  and route.  Heart rate improved into the 70s to 100s.  On my arrival she currently states that she does not have headache or chest pain.  Does endorse some shortness of breath.  Difficult to assess fully given patient's neurologic status.  Patient denies abdominal pain, nausea, vomiting, or diarrhea.  Sister in the room states that she has had a progressive decline in her mental status over the last 2 years, but its worsened over the last 2 weeks.  She states she has a history of VP shunt. States that she is not acting herself today.  She said she is unsure what is going on currently, but noticed that she is having worsening shortness of breath and that her heart rate is high.  Denies any recent medication changes or illnesses.   MDM   I have reviewed the nursing documentation, vital signs, as well as the past medical history, surgical history, family history, and social history.  Initial Assessment:  Patient hemodynamically stable on initial evaluation with heart rates fluctuating from the 80s to 110s.  No signs of hypotension at this time.  Still in A-fib.  Satting  well on room air in the mid 90s.  Currently denying chest pain.  Does have some signs of volume overload peripherally with mild pitting edema.  Daughter states she may have a history of heart failure, but is unsure.  Is on Eliquis  for her A-fib.  Will obtain CBC, CMP, mag, TSH, BNP, and COVID flu will also obtain chest x-ray and shunt series.  Could also be UTI / toxic metabolic induced encephalopathy.  Patient given a bolus of diltiazem  for A-fib with RVR.  Currently hemodynamically stable not requiring electricity.  CT with evidence of mildly worsened ventriculomegaly.  CBC with no significant leukocytosis, or thrombocytopenia.  Chronic anemia, stable.  Magnesium  within normal limits and metabolic panel without significant AKI or electrolyte abnormality.  No signs of transaminitis.  COVID flu negative.  BNP mildly elevated, which would be consistent with volume overload.    UA with evidence of UTI with positive nitrites and leukocyte esterase.  Patient given a dose of Rocephin  for this.  Neurosurgery contacted regarding mildly worsened ventriculomegaly, given shunt is completely intact and patient has other cause more likely cause of encephalopathy.  Neurosurgical follow in the morning.  Patient placed on dill drip for ongoing A-fib with RVR.  Still hemodynamically stable.  Given concerns for altered mentation with recurrent A-fib RVR and possible worsening ventriculomegaly feel patient needs admission.  Feel that this is most likely toxic/metabolic encephalopathy in the setting of UTI, but would benefit from further workup.  Spoke with the medicine team regarding admission and they are  in agreement.  Patient mated to hospitalist service for further workup and management.  Disposition:  I discussed the case with Dr.Opyd who graciously agreed to admit the patient to their service for continued care.    This patient was staffed with Dr. Franklyn who supervised the visit and agreed with the plan of  care.   Due to the patients current presenting symptoms, physical exam findings, and the workup stated above, it is thought that the etiology of the patients current presentation is:  1. Altered mental status, unspecified altered mental status type   2. Atrial fibrillation with rapid ventricular response (HCC)   3. Acute cystitis without hematuria   4. Hydrocephalus, unspecified type (HCC)   5. Anasarca   6. Brain lesion      Clinical Complexity A medically appropriate history, review of systems, and physical exam was performed.  Factors that affect the complexity of this encounter: assessment of correct protocol, laboratory work from this visit, and review of echocardiogram/EKG results  My independent interpretations of diagnostic studies are documented in the ED course above.   If decision rules were used in this patient's evaluation, they are listed below.   Click here for ABCD2, HEART and other calculators  Patient's presentation is most consistent with acute presentation with potential threat to life or bodily function.  MDM generated using voice dictation software and may contain dictation errors. Please contact me for any clarification or with any questions.    Physical Exam, PMH, PSH, Family History, and Social Hsitory   Vitals:   09/03/24 2300 09/03/24 2330 09/03/24 2345 09/04/24 0000  BP: 98/80 103/65 92/68 98/81   Pulse: (!) 137 (!) 103 (!) 120 (!) 132  Resp: 18 15 16 17   Temp:      TempSrc:      SpO2: 95% 94% 93% 93%    Physical Exam HENT:     Head: Normocephalic and atraumatic.  Eyes:     Extraocular Movements: Extraocular movements intact.     Pupils: Pupils are equal, round, and reactive to light.  Cardiovascular:     Rate and Rhythm: Tachycardia present. Rhythm irregular.  Pulmonary:     Breath sounds: Rhonchi (scattered) present. No decreased breath sounds or wheezing.  Abdominal:     Palpations: Abdomen is soft.     Tenderness: There is no  abdominal tenderness. There is no rebound.  Musculoskeletal:        General: Normal range of motion.  Skin:    General: Skin is warm and dry.     Capillary Refill: Capillary refill takes less than 2 seconds.  Neurological:     General: No focal deficit present.     Mental Status: She is alert.     Past Medical History:  Diagnosis Date   Astrocytoma brain tumor (HCC) 1971   astrocytoma treated with radiation   C. difficile colitis 11/2011   complicated by sepsis/ARDS and prolonged hosp   Depression    Hyperlipidemia    Mitral regurgitation 10/2011   severe, s/p min invasive repair and annuloplasty   Mitral valve prolapse    Neuromuscular disorder (HCC)    numbness in hands and feet   Osteoarthritis    Osteoporosis    DEXA @ [facility] 11/2012: -3.5, prior tx Reclast, last in 2011, rec change to Prolia    Overactive bladder    Pleural effusion, left 11/11/2011   Vitamin D  deficiency      Past Surgical History:  Procedure Laterality Date   CARDIAC CATHETERIZATION  Jan 2013   KNEE ARTHROSCOPY Right    MITRAL VALVE REPAIR  10/15/2011   Procedure: MINIMALLY INVASIVE MITRAL VALVE REPAIR (MVR);  Surgeon: Sudie VEAR Laine, MD;  Location: [facility] OR;  Service: Open Heart Surgery;  Laterality: Right;   TRANSESOPHAGEAL ECHOCARDIOGRAM  7/12   US  ECHOCARDIOGRAPHY  6/12   VENTRICULOPERITONEAL SHUNT  1971     Family History  Problem Relation Age of Onset   Coronary artery disease Father 46       died MI   Hyperlipidemia Mother    Heart attack Maternal Grandfather     Social History   Tobacco Use   Smoking status: Former    Current packs/day: 0.00    Types: Cigarettes    Quit date: 09/09/1990    Years since quitting: 34.0   Smokeless tobacco: Never  Substance Use Topics   Alcohol  use: Not Currently    Alcohol /week: 0.0 standard drinks of alcohol     Comment: rarely     Procedures   If procedures were preformed on this patient, they are listed below:  Procedures   Electronically  signed by:   Glendia Carlin Ancona, M.D. PGY-2, Emergency Medicine   Please note that this documentation was produced with the assistance of voice-to-text technology and may contain errors.    Ancona Glendia, MD 09/04/24 [phone]  "

## 2024-09-04 DIAGNOSIS — G9340 Encephalopathy, unspecified: Secondary | ICD-10-CM | POA: Diagnosis not present

## 2024-09-04 LAB — CBC
HCT: 34.6 % — ABNORMAL LOW (ref 36.0–46.0)
Hemoglobin: 10.9 g/dL — ABNORMAL LOW (ref 12.0–15.0)
MCH: 28.8 pg (ref 26.0–34.0)
MCHC: 31.5 g/dL (ref 30.0–36.0)
MCV: 91.3 fL (ref 80.0–100.0)
Platelets: 222 K/uL (ref 150–400)
RBC: 3.79 MIL/uL — ABNORMAL LOW (ref 3.87–5.11)
RDW: 14.8 % (ref 11.5–15.5)
WBC: 11.1 K/uL — ABNORMAL HIGH (ref 4.0–10.5)
nRBC: 0 % (ref 0.0–0.2)

## 2024-09-04 LAB — BASIC METABOLIC PANEL WITH GFR
Anion gap: 12 (ref 5–15)
BUN: 18 mg/dL (ref 8–23)
CO2: 27 mmol/L (ref 22–32)
Calcium: 9.2 mg/dL (ref 8.9–10.3)
Chloride: 103 mmol/L (ref 98–111)
Creatinine, Ser: 0.74 mg/dL (ref 0.44–1.00)
GFR, Estimated: >60 mL/min
Glucose, Bld: 102 mg/dL — ABNORMAL HIGH (ref 70–99)
Potassium: 3.5 mmol/L (ref 3.5–5.1)
Sodium: 142 mmol/L (ref 135–145)

## 2024-09-04 LAB — HIV ANTIBODY (ROUTINE TESTING W REFLEX): HIV Screen 4th Generation wRfx: NONREACTIVE

## 2024-09-04 MED ORDER — METOPROLOL TARTRATE 12.5 MG HALF TABLET
12.5000 mg | ORAL_TABLET | Freq: Two times a day (BID) | ORAL | Status: DC
  Administered 2024-09-04 – 2024-09-06 (×5): 12.5 mg via ORAL
  Filled 2024-09-04 (×5): qty 1

## 2024-09-04 MED ORDER — APIXABAN 5 MG PO TABS
5.0000 mg | ORAL_TABLET | Freq: Two times a day (BID) | ORAL | Status: DC
  Administered 2024-09-04 – 2024-09-08 (×9): 5 mg via ORAL
  Filled 2024-09-04 (×6): qty 1

## 2024-09-04 MED ORDER — ALBUMIN HUMAN 25 % IV SOLN
25.0000 g | Freq: Once | INTRAVENOUS | Status: AC
  Administered 2024-09-04: 25 g via INTRAVENOUS
  Filled 2024-09-04: qty 100

## 2024-09-04 NOTE — Progress Notes (Addendum)
 "    Progress Note    Michaela [name]  FMW:[id number] DOB: [birth year]  DOA: 09/03/2024 PCP: [facility]      Brief Narrative:    Medical records reviewed and are as summarized below:  Michaela [name] is a 68 y.o. 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 Heart rate was as high as the 150s and EMS was called.  She was found to have acute UTI, atrial fibrillation with RVR.      Assessment/Plan:   Principal Problem:   Acute encephalopathy Active Problems:   Atrial fibrillation with RVR (HCC)   Neurocognitive deficits   Obstructive hydrocephalus (HCC)   Acute cystitis   There is no height or weight on file to calculate BMI.   Acute UTI: Continue IV ceftriaxone .  Follow-up urine culture   Acute metabolic encephalopathy: Mental status has improved. She has underlying cognitive impairment.   Atrial fibrillation with RVR: Heart rate has improved.  Discontinue IV Cardizem  infusion.  Continue digoxin .  Restart home metoprolol  and Eliquis    Hydrocephalus with VP shunt in place: She has been evaluated by neurosurgeon, Dr. Louis.  No VP shunt malfunction   Comorbidities include anxiety, hearing impairment, dysphagia on modified diet  Diet Order             DIET DYS 2 Room service appropriate? Yes; Fluid consistency: Thin  Diet effective now                                  Consultants: Neurosurgeon  Procedures: None    Medications:    artificial tears  1 drop Both Eyes QID   atorvastatin   20 mg Oral Daily   clonazepam   0.25 mg Oral QHS   clonazePAM   0.5 mg Oral BID   digoxin    0.0625 mg Oral Daily   docusate sodium   100 mg Oral Daily   escitalopram   10 mg Oral Daily   melatonin  3 mg Oral QHS   metoprolol  tartrate  12.5 mg Oral BID   mirabegron  ER  25 mg Oral Daily   pantoprazole   40 mg Oral Daily   polyethylene glycol  17 g Oral Daily   sodium chloride  flush  3 mL Intravenous Q12H   Continuous Infusions:  cefTRIAXone  (ROCEPHIN )  IV     diltiazem  (CARDIZEM ) infusion Stopped (09/04/24 1113)     Anti-infectives (From admission, onward)    Start     Dose/Rate Route Frequency Ordered Stop   09/04/24 2145  cefTRIAXone  (ROCEPHIN ) 1 g in sodium chloride  0.9 % 100 mL IVPB        1 g 200 mL/hr over 30 Minutes Intravenous Every 24 hours 09/03/24 2226     09/03/24 2145  cefTRIAXone  (ROCEPHIN ) 2 g in sodium chloride  0.9 % 100 mL IVPB        2 g 200 mL/hr over 30 Minutes Intravenous  Once 09/03/24 2130 09/03/24 2225              Family Communication/Anticipated D/C date and plan/Code Status   DVT prophylaxis: SCDs Start: 09/03/24 2226 SCDs Start: 09/03/24 2220     Code Status: Limited: Do not attempt resuscitation (  DNR) -DNR-LIMITED -Do Not Intubate/DNI   Family Communication: Plan discussed with Luke, sister, at the bedside Disposition Plan: Plan to discharge to nursing home -   Status is: Inpatient Remains inpatient appropriate because: Acute UTI       Subjective:   Interval events noted.  No complaints reported.  [name], sister, was at the bedside.  Donnice, RN, was also at the bedside.  Objective:    Vitals:   09/04/24 1110 09/04/24 1113 09/04/24 1130 09/04/24 1145  BP:   100/64 98/62  Pulse: (!) 53 (!) 59 74   Resp: 16 15 14 10   Temp:      TempSrc:      SpO2: 100% 100% 99% 100%   No data found.  No intake or output data in the 24 hours ending 09/04/24 1320 There were no vitals filed for this visit.  Exam:  GEN: NAD SKIN: Warm and dry EYES: No pallor ENT: MMM, hearing impairment CV: Irregular rate and rhythm PULM: CTA  B ABD: soft, ND, NT, +BS CNS: AAO x 3, unable to lift both lower extremities EXT: No edema or tenderness.  Contraction deformity of bilateral feet        Data Reviewed:   I have personally reviewed following labs and imaging studies:  Labs: Labs show the following:   Basic Metabolic Panel: Recent Labs  Lab 09/03/24 1759 09/04/24 0415  NA 140 142  K 4.3 3.5  CL 107 103  CO2 25 27  GLUCOSE 89 102*  BUN 23 18  CREATININE 0.73 0.74  CALCIUM  8.8* 9.2  MG 1.8  --    GFR CrCl cannot be calculated (Unknown ideal weight.). Liver Function Tests: Recent Labs  Lab 09/03/24 1759  AST 15  ALT 9  ALKPHOS 125  BILITOT 0.6  PROT 6.4*  ALBUMIN  3.7   No results for input(s): LIPASE, AMYLASE in the last 168 hours. No results for input(s): AMMONIA in the last 168 hours. Coagulation profile No results for input(s): INR, PROTIME in the last 168 hours.  CBC: Recent Labs  Lab 09/03/24 1759 09/04/24 0415  WBC 8.8 11.1*  NEUTROABS 5.7  --   HGB 11.0* 10.9*  HCT 36.1 34.6*  MCV 92.8 91.3  PLT 232 222   Cardiac Enzymes: No results for input(s): CKTOTAL, CKMB, CKMBINDEX, TROPONINI in the last 168 hours. BNP (last 3 results) Recent Labs    09/03/24 1759  PROBNP 4,978.0*   CBG: Recent Labs  Lab 09/03/24 1750  GLUCAP 93   D-Dimer: No results for input(s): DDIMER in the last 72 hours. Hgb A1c: No results for input(s): HGBA1C in the last 72 hours. Lipid Profile: No results for input(s): CHOL, HDL, LDLCALC, TRIG, CHOLHDL, LDLDIRECT in the last 72 hours. Thyroid  function studies: Recent Labs    09/03/24 1759  TSH 2.730   Anemia work up: No results for input(s): VITAMINB12, FOLATE, FERRITIN, TIBC, IRON , RETICCTPCT in the last 72 hours. Sepsis Labs: Recent Labs  Lab 09/03/24 1759 09/04/24 0415  WBC 8.8 11.1*    Microbiology Recent Results (from the past 240 hours)  Resp panel by RT-PCR (RSV, Flu A&B, Covid)  Anterior Nasal Swab     Status: None   Collection Time: 09/03/24  6:28 PM   Specimen: Anterior Nasal Swab  Result Value Ref Range Status   SARS Coronavirus 2 by RT PCR NEGATIVE NEGATIVE Final   Influenza A by PCR NEGATIVE NEGATIVE Final   Influenza B by PCR NEGATIVE NEGATIVE Final    Comment: (  NOTE) The Xpert Xpress SARS-CoV-2/FLU/RSV plus assay is intended as an aid in the diagnosis of influenza from Nasopharyngeal swab specimens and should not be used as a sole basis for treatment. Nasal washings and aspirates are unacceptable for Xpert Xpress SARS-CoV-2/FLU/RSV testing.  Fact Sheet for Patients: bloggercourse.com  Fact Sheet for Healthcare Providers: seriousbroker.it  This test is not yet approved or cleared by the [place]  FDA and has been authorized for detection and/or diagnosis of SARS-CoV-2 by FDA under an Emergency Use Authorization (EUA). This EUA will remain in effect (meaning this test can be used) for the duration of the COVID-19 declaration under Section 564(b)(1) of the Act, 21 U.S.C. section 360bbb-3(b)(1), unless the authorization is terminated or revoked.     Resp Syncytial Virus by PCR NEGATIVE NEGATIVE Final    Comment: (NOTE) Fact Sheet for Patients: bloggercourse.com  Fact Sheet for Healthcare Providers: seriousbroker.it  This test is not yet approved or cleared by the [place]  FDA and has been authorized for detection and/or diagnosis of SARS-CoV-2 by FDA under an Emergency Use Authorization (EUA). This EUA will remain in effect (meaning this test can be used) for the duration of the COVID-19 declaration under Section 564(b)(1) of the Act, 21 U.S.C. section 360bbb-3(b)(1), unless the authorization is terminated or revoked.  Performed at [facility], [address] [place], [place] [address]     Procedures and diagnostic  studies:  DG Abd 1 View Result Date: 09/03/2024 EXAM: 1 VIEW XRAY OF THE ABDOMEN 09/03/2024 07:18:00 PM COMPARISON: Cervical spine x-ray and chest x-ray 09/03/2024 CLINICAL HISTORY: evaluate for shunt malfunction FINDINGS: LINES, TUBES AND DEVICES: Both VP shunts course below in the esophagus with the tip overlying the abdomen. BOWEL: Nonobstructive bowel gas pattern. SOFT TISSUES: 2 metallic clips overlying the right upper quadrant. Mitral annular calcification status post repair. BONES: No acute fracture. LUNGS/PLEURAL SPACES: Patchy airspace opacity of the left lower lung zone with likely trace left pleural effusion. IMPRESSION: 1. Ventriculoperitoneal shunt catheters project below the diaphragm with distal tips overlying the abdomen. 2. Nonobstructive bowel gas pattern. 3. Patchy left lower lung airspace opacity with likely trace left pleural effusion. Electronically signed by: Morgane Naveau MD 09/03/2024 09:31 PM EST RP Workstation: [id number]   DG Skull 1-3 Views Result Date: 09/03/2024 EXAM: 3 VIEW(S) XRAY OF THE SKULL 09/03/2024 07:18:00 PM COMPARISON: None available. CLINICAL HISTORY: evaluate for shunt malfunction FINDINGS: BONES: No acute fracture. SINUSES: Unremarkable. SOFT TISSUES: Two right ventriculoperitoneal (VP) shunts are present. One shunt, with a frontal approach, correlates with the medial most shunt noted on the cervical spine x-ray. The second shunt, likely an abandoned posterior approach right VP shunt, correlates with the lateral most noted shunt on cervical spine x-ray and is kinked and calcified in the neck. IMPRESSION: 1. Two right VP shunts with a frontal approach shunt that correlates with the medial most shunt noted on the cervical spine x-ray. 2. Likely abandoned posterior approach right VP shunt that correlates with the lateral most noted shunt on cervical spine x-ray that is kinked and calcified in the neck. Electronically signed by: Morgane Naveau MD 09/03/2024 09:29 PM  EST RP Workstation: [id number]   DG Cervical Spine 1 View Result Date: 09/03/2024 EXAM: 1 VIEW(S) XRAY OF THE CERVICAL SPINE 09/03/2024 07:18:00 PM COMPARISON: None available. CLINICAL HISTORY: AMS (altered mental status). FINDINGS: BONES: Vertebral body heights are maintained. Alignment is normal. DISCS AND DEGENERATIVE CHANGES: No severe degenerative changes. SOFT TISSUES: No prevertebral soft tissue swelling. Two VP shunts are  noted. A more lateral right neck shunt demonstrates possible kinking and calcification along the course of the tube. The more medial tube overlying the neck and chest appears continuous with no kinking. The visualized lungs appear clear. IMPRESSION: 1. Two VP shunts are noted. A more lateral right neck shunt demonstrates possible kinking and calcification along the course of the tube. The more medial tube overlying the neck and chest appears continuous with no kinking. Electronically signed by: Morgane Naveau MD 09/03/2024 09:25 PM EST RP Workstation: [id number]   CT Head Wo Contrast Result Date: 09/03/2024 EXAM: CT HEAD WITHOUT CONTRAST 09/03/2024 07:54:45 PM TECHNIQUE: CT of the head was performed without the administration of intravenous contrast. Automated exposure control, iterative reconstruction, and/or weight based adjustment of the mA/kV was utilized to reduce the radiation dose to as low as reasonably achievable. COMPARISON: CT Head dated 09/05/2022 and MRI dated 08/27/2018. CLINICAL HISTORY: Mental status change, unknown cause. FINDINGS: BRAIN AND VENTRICLES: Right frontal and right parietal approach ventriculostomy catheters in place. Similar postoperative changes of the posterior fossa with areas of dystrophic calcifications along the margin of the dilated 4th ventricle. Similar appearance of atrophy and encephalomalacia throughout the cerebellum. Interval increase in size of the lateral ventricles compared to the 2023 CT. The third ventricle is also increased in  caliber compared to prior. Balloond and intrapped appearance of the fourth ventricle is similar to prior. No midline shift. Similar appearance of generalized cerebral volume loss. Left parieto-occipital chronic infarct. Similar encephalomalacia in the right frontal lobe along the ventriculostomy catheter tract. Masslike soft tissue along the dorsal aspect of the clivus which is slightly increased in size compared to the 2023 CT and is significantly increased in size compared to the MRI from 08/27/2018. The soft tissue now measures up to 1.8 x 1.0 cm best seen on sagittal series 6 image 30. There is indentation of the ventral pons. Recommend contrast enhanced MRI of the brain for further evaluation. No acute hemorrhage. No evidence of acute infarct. No extra-axial collection. ORBITS: No acute abnormality. SINUSES: No acute abnormality. SOFT TISSUES AND SKULL: Postsurgical changes of suboccipital craniectomy. No acute soft tissue abnormality. No skull fracture. IMPRESSION: 1. Interval increase in size of the lateral and third ventricles compared to the 2023 CT. Similar post surgical changes of the posterior fossa and entrapped appearance of the fourth ventricle. 2. Masslike soft tissue along the dorsal aspect of the clivus is slightly increased in size compared to the 2023 CT and significantly increased in size compared to the MRI from 08/27/2018, now measuring up to 1.8 x 1.0 cm with indentation of the ventral pons. Recommend contrast enhanced MRI of the brain for further evaluation. Electronically signed by: Donnice Mania MD 09/03/2024 08:51 PM EST RP Workstation: [id number]   DG Chest Portable 1 View Result Date: 09/03/2024 EXAM: 1 VIEW(S) XRAY OF THE CHEST 09/03/2024 06:08:00 PM COMPARISON: None available. CLINICAL HISTORY: SOB FINDINGS: LINES, TUBES AND DEVICES: VP shunt tubing traverses right chest wall. LUNGS AND PLEURA: Mildly low lung volumes. There are some interstitial opacities in both lower lungs which  may represent atelectasis. Small left pleural effusion. No pneumothorax. HEART AND MEDIASTINUM: Upper normal heart size. Mitral annular calcification. Aortic atherosclerosis (ICD10-I70.0). BONES AND SOFT TISSUES: No acute osseous abnormality. IMPRESSION: 1. Small left pleural effusion. 2. Mild bibasilar interstitial opacities, favored atelectasis. Electronically signed by: Greig Pique MD 09/03/2024 07:26 PM EST RP Workstation: [id number]               LOS: 1 day  Jakya Dovidio  [employer] on [email or web address]. If 7PM-7AM, please contact night-coverage at [email or web address]     09/04/2024, 1:20 PM           "

## 2024-09-04 NOTE — Consult Note (Signed)
 Reason for Consult: Possible shunt malfunction Referring Physician: Emergency department  Michaela [name] is an 68 y.o. female.  HPI: 68 year old female status post cerebellar astrocytoma resection with subsequent radiation.  Patient with chronic hydrocephalus.  Patient with history of VP shunt revision in 2023 at [facility] with installation of a new left-sided VP shunt system.  Patient has had some steady decline over the past few months but has become increasingly confused here lately.  Also with fevers, tachycardia and signs of possible sepsis.  Patient with UTI per UA.  No recent shot interventions.  No indication as to whether her shunt valve pressures have been adjusted over time.  Past Medical History:  Diagnosis Date   Astrocytoma brain tumor (HCC) 1971   astrocytoma treated with radiation   C. difficile colitis 11/2011   complicated by sepsis/ARDS and prolonged hosp   Depression    Hyperlipidemia    Mitral regurgitation 10/2011   severe, s/p min invasive repair and annuloplasty   Mitral valve prolapse    Neuromuscular disorder (HCC)    numbness in hands and feet   Osteoarthritis    Osteoporosis    DEXA @ [facility] 11/2012: -3.5, prior tx Reclast, last in 2011, rec change to Prolia    Overactive bladder    Pleural effusion, left 11/11/2011   Vitamin D  deficiency     Past Surgical History:  Procedure Laterality Date   CARDIAC CATHETERIZATION     Jan 2013   KNEE ARTHROSCOPY Right    MITRAL VALVE REPAIR  10/15/2011   Procedure: MINIMALLY INVASIVE MITRAL VALVE REPAIR (MVR);  Surgeon: Sudie VEAR Laine, MD;  Location: [facility] OR;  Service: Open Heart Surgery;  Laterality: Right;   TRANSESOPHAGEAL ECHOCARDIOGRAM  7/12   US  ECHOCARDIOGRAPHY  6/12   VENTRICULOPERITONEAL SHUNT  1971    Family History  Problem Relation Age of Onset   Coronary artery disease Father 20       died MI   Hyperlipidemia Mother    Heart attack Maternal Grandfather     Social History:  reports that she  quit smoking about 34 years ago. She has never used smokeless tobacco. She reports that she does not currently use alcohol . She reports that she does not use drugs.  Allergies: Allergies[1]  Medications: I have reviewed the patient's current medications.  Results for orders placed or performed during the hospital encounter of 09/03/24 (from the past 48 hours)  CBG monitoring, ED     Status: None   Collection Time: 09/03/24  5:50 PM  Result Value Ref Range   Glucose-Capillary 93 70 - 99 mg/dL    Comment: Glucose reference range applies only to samples taken after fasting for at least 8 hours.  CBC with Differential     Status: Abnormal   Collection Time: 09/03/24  5:59 PM  Result Value Ref Range   WBC 8.8 4.0 - 10.5 K/uL   RBC 3.89 3.87 - 5.11 MIL/uL   Hemoglobin 11.0 (L) 12.0 - 15.0 g/dL   HCT 63.8 63.9 - 53.9 %   MCV 92.8 80.0 - 100.0 fL   MCH 28.3 26.0 - 34.0 pg   MCHC 30.5 30.0 - 36.0 g/dL   RDW 85.1 88.4 - 84.4 %   Platelets 232 150 - 400 K/uL   nRBC 0.0 0.0 - 0.2 %   Neutrophils Relative % 63 %   Neutro Abs 5.7 1.7 - 7.7 K/uL   Lymphocytes Relative 25 %   Lymphs Abs 2.2 0.7 - 4.0 K/uL  Monocytes Relative 8 %   Monocytes Absolute 0.7 0.1 - 1.0 K/uL   Eosinophils Relative 2 %   Eosinophils Absolute 0.2 0.0 - 0.5 K/uL   Basophils Relative 1 %   Basophils Absolute 0.0 0.0 - 0.1 K/uL   Immature Granulocytes 1 %   Abs Immature Granulocytes 0.04 0.00 - 0.07 K/uL    Comment: Performed at [facility], [address] [place], [place] [address]  Comprehensive metabolic panel     Status: Abnormal   Collection Time: 09/03/24  5:59 PM  Result Value Ref Range   Sodium 140 135 - 145 mmol/L   Potassium 4.3 3.5 - 5.1 mmol/L   Chloride 107 98 - 111 mmol/L   CO2 25 22 - 32 mmol/L   Glucose, Bld 89 70 - 99 mg/dL    Comment: Glucose reference range applies only to samples taken after fasting for at least 8 hours.   BUN 23 8 - 23 mg/dL   Creatinine, Ser 9.26 0.44 - 1.00 mg/dL    Calcium  8.8 (L) 8.9 - 10.3 mg/dL   Total Protein 6.4 (L) 6.5 - 8.1 g/dL   Albumin  3.7 3.5 - 5.0 g/dL   AST 15 15 - 41 U/L   ALT 9 0 - 44 U/L   Alkaline Phosphatase 125 38 - 126 U/L   Total Bilirubin 0.6 0.0 - 1.2 mg/dL   GFR, Estimated >39 >39 mL/min    Comment: (NOTE) Calculated using the CKD-EPI Creatinine Equation (2021)    Anion gap 9 5 - 15    Comment: Performed at [facility], [address], [place], [place] [address]  Magnesium      Status: None   Collection Time: 09/03/24  5:59 PM  Result Value Ref Range   Magnesium  1.8 1.7 - 2.4 mg/dL    Comment: Performed at [facility], [address], [place], [place] [address]  TSH     Status: None   Collection Time: 09/03/24  5:59 PM  Result Value Ref Range   TSH 2.730 0.350 - 4.500 uIU/mL    Comment: Performed at [facility], [address] [place], [place] [address]  Pro Brain natriuretic peptide     Status: Abnormal   Collection Time: 09/03/24  5:59 PM  Result Value Ref Range   Pro Brain Natriuretic Peptide 4,978.0 (H) <300.0 pg/mL    Comment: (NOTE) Age Group        Cut-Points    Interpretation  < 50 years     450 pg/mL       NT-proBNP > 450 pg/mL indicates                                ADHF is likely              50 to 75 years  900 pg/mL      NT-proBNP > 900 pg/mL indicates          ADHF is likely  > 75 years      1800 pg/mL     NT-proBNP > 1800 pg/mL indicates          ADHF is likely                           All ages    Results between       Indeterminate. Further clinical  300 and the cut-   information is needed to determine            point for age group   if ADHF is present.                                                             Elecsys proBNP II/ Elecsys proBNP II STAT           Cut-Point                       Interpretation  300 pg/mL                    NT-proBNP <300pg/mL indicates                             ADHF is not likely  Performed at [facility]  Lab, [address] [place], [place] [address]   Resp panel by RT-PCR (RSV, Flu A&B, Covid) Anterior Nasal Swab     Status: None   Collection Time: 09/03/24  6:28 PM   Specimen: Anterior Nasal Swab  Result Value Ref Range   SARS Coronavirus 2 by RT PCR NEGATIVE NEGATIVE   Influenza A by PCR NEGATIVE NEGATIVE   Influenza B by PCR NEGATIVE NEGATIVE    Comment: (NOTE) The Xpert Xpress SARS-CoV-2/FLU/RSV plus assay is intended as an aid in the diagnosis of influenza from Nasopharyngeal swab specimens and should not be used as a sole basis for treatment. Nasal washings and aspirates are unacceptable for Xpert Xpress SARS-CoV-2/FLU/RSV testing.  Fact Sheet for Patients: bloggercourse.com  Fact Sheet for Healthcare Providers: seriousbroker.it  This test is not yet approved or cleared by the [place]  FDA and has been authorized for detection and/or diagnosis of SARS-CoV-2 by FDA under an Emergency Use Authorization (EUA). This EUA will remain in effect (meaning this test can be used) for the duration of the COVID-19 declaration under Section 564(b)(1) of the Act, 21 U.S.C. section 360bbb-3(b)(1), unless the authorization is terminated or revoked.     Resp Syncytial Virus by PCR NEGATIVE NEGATIVE    Comment: (NOTE) Fact Sheet for Patients: bloggercourse.com  Fact Sheet for Healthcare Providers: seriousbroker.it  This test is not yet approved or cleared by the [place]  FDA and has been authorized for detection and/or diagnosis of SARS-CoV-2 by FDA under an Emergency Use Authorization (EUA). This EUA will remain in effect (meaning this test can be used) for the duration of the COVID-19 declaration under Section 564(b)(1) of the Act, 21 U.S.C. section 360bbb-3(b)(1), unless the authorization is terminated or revoked.  Performed at [facility], [address]  [place], [place] [address]   Urinalysis, Routine w reflex microscopic -Urine, Clean Catch     Status: Abnormal   Collection Time: 09/03/24  9:05 PM  Result Value Ref Range   Color, Urine YELLOW YELLOW   APPearance TURBID (A) CLEAR   Specific Gravity, Urine 1.024 1.005 - 1.030   pH 5.0 5.0 - 8.0   Glucose, UA NEGATIVE NEGATIVE mg/dL   Hgb urine dipstick MODERATE (A) NEGATIVE   Bilirubin Urine NEGATIVE NEGATIVE   Ketones, ur NEGATIVE NEGATIVE mg/dL   Protein, ur 899 (A) NEGATIVE  mg/dL   Nitrite POSITIVE (A) NEGATIVE   Leukocytes,Ua LARGE (A) NEGATIVE   RBC / HPF >50 0 - 5 RBC/hpf   WBC, UA >50 0 - 5 WBC/hpf   Bacteria, UA MANY (A) NONE SEEN   Squamous Epithelial / HPF 0-5 0 - 5 /HPF   Mucus PRESENT     Comment: Performed at [facility], [address], [place], [place] [address]    DG Abd 1 View Result Date: 09/03/2024 EXAM: 1 VIEW XRAY OF THE ABDOMEN 09/03/2024 07:18:00 PM COMPARISON: Cervical spine x-ray and chest x-ray 09/03/2024 CLINICAL HISTORY: evaluate for shunt malfunction FINDINGS: LINES, TUBES AND DEVICES: Both VP shunts course below in the esophagus with the tip overlying the abdomen. BOWEL: Nonobstructive bowel gas pattern. SOFT TISSUES: 2 metallic clips overlying the right upper quadrant. Mitral annular calcification status post repair. BONES: No acute fracture. LUNGS/PLEURAL SPACES: Patchy airspace opacity of the left lower lung zone with likely trace left pleural effusion. IMPRESSION: 1. Ventriculoperitoneal shunt catheters project below the diaphragm with distal tips overlying the abdomen. 2. Nonobstructive bowel gas pattern. 3. Patchy left lower lung airspace opacity with likely trace left pleural effusion. Electronically signed by: Morgane Naveau MD 09/03/2024 09:31 PM EST RP Workstation: [id number]   DG Skull 1-3 Views Result Date: 09/03/2024 EXAM: 3 VIEW(S) XRAY OF THE SKULL 09/03/2024 07:18:00 PM COMPARISON: None available. CLINICAL HISTORY: evaluate for shunt  malfunction FINDINGS: BONES: No acute fracture. SINUSES: Unremarkable. SOFT TISSUES: Two right ventriculoperitoneal (VP) shunts are present. One shunt, with a frontal approach, correlates with the medial most shunt noted on the cervical spine x-ray. The second shunt, likely an abandoned posterior approach right VP shunt, correlates with the lateral most noted shunt on cervical spine x-ray and is kinked and calcified in the neck. IMPRESSION: 1. Two right VP shunts with a frontal approach shunt that correlates with the medial most shunt noted on the cervical spine x-ray. 2. Likely abandoned posterior approach right VP shunt that correlates with the lateral most noted shunt on cervical spine x-ray that is kinked and calcified in the neck. Electronically signed by: Morgane Naveau MD 09/03/2024 09:29 PM EST RP Workstation: [id number]   DG Cervical Spine 1 View Result Date: 09/03/2024 EXAM: 1 VIEW(S) XRAY OF THE CERVICAL SPINE 09/03/2024 07:18:00 PM COMPARISON: None available. CLINICAL HISTORY: AMS (altered mental status). FINDINGS: BONES: Vertebral body heights are maintained. Alignment is normal. DISCS AND DEGENERATIVE CHANGES: No severe degenerative changes. SOFT TISSUES: No prevertebral soft tissue swelling. Two VP shunts are noted. A more lateral right neck shunt demonstrates possible kinking and calcification along the course of the tube. The more medial tube overlying the neck and chest appears continuous with no kinking. The visualized lungs appear clear. IMPRESSION: 1. Two VP shunts are noted. A more lateral right neck shunt demonstrates possible kinking and calcification along the course of the tube. The more medial tube overlying the neck and chest appears continuous with no kinking. Electronically signed by: Morgane Naveau MD 09/03/2024 09:25 PM EST RP Workstation: [id number]   CT Head Wo Contrast Result Date: 09/03/2024 EXAM: CT HEAD WITHOUT CONTRAST 09/03/2024 07:54:45 PM TECHNIQUE: CT of the head  was performed without the administration of intravenous contrast. Automated exposure control, iterative reconstruction, and/or weight based adjustment of the mA/kV was utilized to reduce the radiation dose to as low as reasonably achievable. COMPARISON: CT Head dated 09/05/2022 and MRI dated 08/27/2018. CLINICAL HISTORY: Mental status change, unknown cause. FINDINGS: BRAIN AND VENTRICLES: Right frontal and right parietal  approach ventriculostomy catheters in place. Similar postoperative changes of the posterior fossa with areas of dystrophic calcifications along the margin of the dilated 4th ventricle. Similar appearance of atrophy and encephalomalacia throughout the cerebellum. Interval increase in size of the lateral ventricles compared to the 2023 CT. The third ventricle is also increased in caliber compared to prior. Balloond and intrapped appearance of the fourth ventricle is similar to prior. No midline shift. Similar appearance of generalized cerebral volume loss. Left parieto-occipital chronic infarct. Similar encephalomalacia in the right frontal lobe along the ventriculostomy catheter tract. Masslike soft tissue along the dorsal aspect of the clivus which is slightly increased in size compared to the 2023 CT and is significantly increased in size compared to the MRI from 08/27/2018. The soft tissue now measures up to 1.8 x 1.0 cm best seen on sagittal series 6 image 30. There is indentation of the ventral pons. Recommend contrast enhanced MRI of the brain for further evaluation. No acute hemorrhage. No evidence of acute infarct. No extra-axial collection. ORBITS: No acute abnormality. SINUSES: No acute abnormality. SOFT TISSUES AND SKULL: Postsurgical changes of suboccipital craniectomy. No acute soft tissue abnormality. No skull fracture. IMPRESSION: 1. Interval increase in size of the lateral and third ventricles compared to the 2023 CT. Similar post surgical changes of the posterior fossa and entrapped  appearance of the fourth ventricle. 2. Masslike soft tissue along the dorsal aspect of the clivus is slightly increased in size compared to the 2023 CT and significantly increased in size compared to the MRI from 08/27/2018, now measuring up to 1.8 x 1.0 cm with indentation of the ventral pons. Recommend contrast enhanced MRI of the brain for further evaluation. Electronically signed by: Donnice Mania MD 09/03/2024 08:51 PM EST RP Workstation: [id number]   DG Chest Portable 1 View Result Date: 09/03/2024 EXAM: 1 VIEW(S) XRAY OF THE CHEST 09/03/2024 06:08:00 PM COMPARISON: None available. CLINICAL HISTORY: SOB FINDINGS: LINES, TUBES AND DEVICES: VP shunt tubing traverses right chest wall. LUNGS AND PLEURA: Mildly low lung volumes. There are some interstitial opacities in both lower lungs which may represent atelectasis. Small left pleural effusion. No pneumothorax. HEART AND MEDIASTINUM: Upper normal heart size. Mitral annular calcification. Aortic atherosclerosis (ICD10-I70.0). BONES AND SOFT TISSUES: No acute osseous abnormality. IMPRESSION: 1. Small left pleural effusion. 2. Mild bibasilar interstitial opacities, favored atelectasis. Electronically signed by: Greig Pique MD 09/03/2024 07:26 PM EST RP Workstation: [id number]    Review of systems not obtained due to patient factors. Blood pressure 104/66, pulse 71, temperature 98 F (36.7 C), temperature source Oral, resp. rate 15, SpO2 94%. Patient is awake and aware.  She answers questions reasonably appropriately.  She is oriented to person but not place or time.  Moves all extremities well.  Head CT scan demonstrates good appearance of her left frontal VP shunt system.  Shunt in continuity on shunt series x-rays.  Patient also with an old right occipital VP shunt system with calcification and multiple connectors along the course of its placement.  Ventricles are certainly slightly larger than the previous scan done a couple years ago but again I do  not know whether her shunt pressure has been evaluated or changed since the scan 2 years ago where her ventricles were near slits.  Currently her ventricles are small normal in size.  She has no evidence of temporal horn dilation.  Her subarachnoid space along her convexities is not compressed or distorted.  Assessment/Plan: I see no indication of VP shunt malfunction.  Patient's  clinical presentation is not consistent with this.  I think more likely than not she is suffering some degree of urosepsis with secondary delirium.  I would recommend treating her infectious process.  If there remains questions about her shunt she should follow-up as an outpatient with her treating neurosurgeon.  Victory A Lauriel Helin 09/04/2024, 12:52 AM         [1]  Allergies Allergen Reactions   Compazine Other (See Comments)    eyes get buggy   Prochlorperazine    Prochlorperazine Edisylate    Prochlorperazine Maleate

## 2024-09-04 NOTE — ED Notes (Signed)
 RN noted last systolic BP 86.  Cardizem  drip reduced to 5 mg / hour.  MD notified.

## 2024-09-04 NOTE — ED Notes (Signed)
 This RN changed pt brief, repositioned pt.  Pt states she is comfortable.

## 2024-09-04 NOTE — ED Notes (Signed)
 Heart rate maintaining in the 50's, systolic blood pressure maintaining in the 90's. Jens, MD, made aware via secure chat. Verbal order to discontinue diltiazem  infusion. Will continue to monitor vital signs. Patient without new symptoms at this time.

## 2024-09-04 NOTE — Progress Notes (Signed)
" ° °  Brief Progress Note   _____________________________________________________________________________________________________________  Patient Name: Michaela [name] Patient DOB: 1956/01/13 Date: @TODAY @      Data: 68 yo female currently awaiting admission to a Progressive bed at [facility]     Action: Reviewed the patient's vital signs, lab results, and clinical notes to assess if patient appropriate to level-load from [facility] to [facility] for admission.    Response:  Pt with VP shunt. Neurosurgery consulted. Pt remains at [facility]. Awaiting bed assignment.  _____________________________________________________________________________________________________________  [facility] RN Expeditor Deara Bober S Maytal Mijangos Please contact us  directly via secure chat (search for [facility]) or by calling us  at [phone] [facility]).  "

## 2024-09-04 NOTE — Progress Notes (Signed)
Patient arrived on unit.

## 2024-09-05 DIAGNOSIS — G9340 Encephalopathy, unspecified: Secondary | ICD-10-CM | POA: Diagnosis not present

## 2024-09-05 LAB — IRON AND TIBC
Iron: 42 ug/dL (ref 28–170)
Saturation Ratios: 13 % (ref 10.4–31.8)
TIBC: 332 ug/dL (ref 250–450)
UIBC: 290 ug/dL

## 2024-09-05 LAB — BASIC METABOLIC PANEL WITH GFR
Anion gap: 9 (ref 5–15)
BUN: 22 mg/dL (ref 8–23)
CO2: 25 mmol/L (ref 22–32)
Calcium: 8.8 mg/dL — ABNORMAL LOW (ref 8.9–10.3)
Chloride: 105 mmol/L (ref 98–111)
Creatinine, Ser: 0.69 mg/dL (ref 0.44–1.00)
GFR, Estimated: >60 mL/min
Glucose, Bld: 103 mg/dL — ABNORMAL HIGH (ref 70–99)
Potassium: 4.1 mmol/L (ref 3.5–5.1)
Sodium: 139 mmol/L (ref 135–145)

## 2024-09-05 LAB — CBC
HCT: 33.1 % — ABNORMAL LOW (ref 36.0–46.0)
Hemoglobin: 10.6 g/dL — ABNORMAL LOW (ref 12.0–15.0)
MCH: 29 pg (ref 26.0–34.0)
MCHC: 32 g/dL (ref 30.0–36.0)
MCV: 90.4 fL (ref 80.0–100.0)
Platelets: 207 K/uL (ref 150–400)
RBC: 3.66 MIL/uL — ABNORMAL LOW (ref 3.87–5.11)
RDW: 14.6 % (ref 11.5–15.5)
WBC: 9.7 K/uL (ref 4.0–10.5)
nRBC: 0 % (ref 0.0–0.2)

## 2024-09-05 LAB — MAGNESIUM: Magnesium: 1.8 mg/dL (ref 1.7–2.4)

## 2024-09-05 LAB — VITAMIN B12: Vitamin B-12: 386 pg/mL (ref 180–914)

## 2024-09-05 LAB — PHOSPHORUS: Phosphorus: 2.8 mg/dL (ref 2.5–4.6)

## 2024-09-05 MED ORDER — MAGNESIUM SULFATE 2 GM/50ML IV SOLN
2.0000 g | Freq: Once | INTRAVENOUS | Status: AC
  Administered 2024-09-05: 2 g via INTRAVENOUS
  Filled 2024-09-05: qty 50

## 2024-09-05 MED ORDER — VITAMIN B-12 100 MCG PO TABS
500.0000 ug | ORAL_TABLET | Freq: Every day | ORAL | Status: DC
  Administered 2024-09-05 – 2024-09-08 (×4): 500 ug via ORAL
  Filled 2024-09-05 (×2): qty 5

## 2024-09-05 MED ORDER — AMIODARONE HCL IN DEXTROSE 360-4.14 MG/200ML-% IV SOLN
60.0000 mg/h | INTRAVENOUS | Status: DC
  Administered 2024-09-05: 60 mg/h via INTRAVENOUS
  Filled 2024-09-05: qty 200

## 2024-09-05 MED ORDER — AMIODARONE HCL IN DEXTROSE 360-4.14 MG/200ML-% IV SOLN
30.0000 mg/h | INTRAVENOUS | Status: DC
  Administered 2024-09-05 – 2024-09-06 (×3): 30 mg/h via INTRAVENOUS
  Filled 2024-09-05 (×3): qty 200

## 2024-09-05 MED ORDER — SODIUM CHLORIDE 0.9 % IV BOLUS
500.0000 mL | Freq: Once | INTRAVENOUS | Status: AC
  Administered 2024-09-05: 500 mL via INTRAVENOUS

## 2024-09-05 MED ORDER — AMIODARONE LOAD VIA INFUSION
150.0000 mg | Freq: Once | INTRAVENOUS | Status: AC
  Administered 2024-09-05: 150 mg via INTRAVENOUS
  Filled 2024-09-05: qty 83.34

## 2024-09-05 NOTE — Progress Notes (Signed)
 [facility] Progress Note  Patient: Michaela [name]    FMW:[id number]  DOA: 09/03/2024     Date of Service: the patient was seen and examined on 09/05/2024  Chief Complaint  Patient presents with   Shortness of Breath   Brief hospital course:  Medical records reviewed and are as summarized below:   Michaela [name] is a 68 y.o. 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 Heart rate was as high as the 150s and EMS was called.   She was found to have acute UTI, atrial fibrillation with RVR.         Assessment and Plan:    Acute UTI: Continue IV ceftriaxone .  Follow-up urine culture 12/28 urine culture growing Klebsiella pneumonia 100K, and Proteus Mirabilis 230k, sensitive report is pending    Acute metabolic encephalopathy: Mental status has improved. She has underlying cognitive impairment. 12/28 AAO x 3, slow to respond    # Atrial fibrillation with RVR:  S/p IV Cardizem  infusion.  Continue digoxin .   Restart home metoprolol  and Eliquis  12/28 A-fib with RVR, with low blood pressure, started amiodarone  IV infusion  IV fluid bolus given due to hypotension Keep magnesium  2.0 and potassium 4.0 Cardiology consult tomorrow a.m. and transition to oral amnio if safe to do so.   hydrocephalus with VP shunt in place: She has been evaluated by neurosurgeon, Dr. Louis.  No VP shunt malfunction     Comorbidities include anxiety, hearing impairment, dysphagia on modified diet  Anemia, monitor H&H B12 386, goal 400, started oral supplement Iron  within normal range  Body mass index is 26.24 kg/m.  Interventions:  Diet:  Dysphagia 2 diet DVT Prophylaxis: Eliquis   Advance goals of care discussion: DNR-limited  Family Communication: family was present at bedside, at the time of interview.  The pt provided permission to discuss medical plan with the family. Opportunity was given to ask question and all questions were answered satisfactorily.   Disposition:  Pt is from SNF , admitted with AMS,UTI and A-fib with RVR, still has A-fib with RVR and on Amio IV infusion and IV antibiotics, which precludes a safe discharge. Discharge to SNF, when stable, most likely in 2 to 3 days.  Subjective: No significant events overnight, patient's mental status is gradually improving, she was laying comfortably, denied any complaints.  Physical Exam: General: NAD, lying comfortably Appear in no distress, affect appropriate Eyes: PERRLA.  Keeps left eye closed due to unknown reason ENT: Oral Mucosa Clear, moist  Neck: no JVD,  Cardiovascular: Irregular rhythm, no Murmur,  Respiratory: good respiratory effort, Bilateral Air entry equal and Decreased, no Crackles, no wheezes Abdomen: Bowel Sound present, Soft and no tenderness,  Skin: no rashes Extremities: no Pedal edema, no calf tenderness Neurologic: without any new focal findings Gait not checked due to patient safety concerns  Vitals:   09/05/24 0900 09/05/24 1200 09/05/24 1309 09/05/24 1603  BP:   94/76 92/69  Pulse:   88 (!) 119  Resp:   10 12  Temp:  98 F (36.7 C) (!) 97.4 F (36.3 C) 98 F (36.7 C)  TempSrc:   Oral Oral  SpO2: 100% 99% 98% 100%  Weight:        Intake/Output Summary (Last  24 hours) at 09/05/2024 1709 Last data filed at 09/05/2024 1310 Gross per 24 hour  Intake --  Output 650 ml  Net -650 ml   Filed Weights   09/05/24 0500  Weight: 55 kg    Data Reviewed: I have personally reviewed and interpreted daily labs, tele strips, imagings as discussed above. I reviewed all nursing notes, pharmacy notes, vitals, pertinent old records I  have discussed plan of care as described above with RN and patient/family.  CBC: Recent Labs  Lab 09/03/24 1759 09/04/24 0415 09/05/24 0632  WBC 8.8 11.1* 9.7  NEUTROABS 5.7  --   --   HGB 11.0* 10.9* 10.6*  HCT 36.1 34.6* 33.1*  MCV 92.8 91.3 90.4  PLT 232 222 207   Basic Metabolic Panel: Recent Labs  Lab 09/03/24 1759 09/04/24 0415 09/05/24 0632 09/05/24 0959  NA 140 142 139  --   K 4.3 3.5 4.1  --   CL 107 103 105  --   CO2 25 27 25   --   GLUCOSE 89 102* 103*  --   BUN 23 18 22   --   CREATININE 0.73 0.74 0.69  --   CALCIUM  8.8* 9.2 8.8*  --   MG 1.8  --   --  1.8  PHOS  --   --   --  2.8    Studies: No results found.  Scheduled Meds:  apixaban   5 mg Oral BID   artificial tears  1 drop Both Eyes QID   atorvastatin   20 mg Oral Daily   clonazepam   0.25 mg Oral QHS   clonazePAM   0.5 mg Oral BID   digoxin   0.0625 mg Oral Daily   docusate sodium   100 mg Oral Daily   escitalopram   10 mg Oral Daily   melatonin  3 mg Oral QHS   metoprolol  tartrate  12.5 mg Oral BID   mirabegron  ER  25 mg Oral Daily   pantoprazole   40 mg Oral Daily   polyethylene glycol  17 g Oral Daily   sodium chloride  flush  3 mL Intravenous Q12H   Continuous Infusions:  amiodarone  30 mg/hr (09/05/24 1550)   cefTRIAXone  (ROCEPHIN )  IV 1 g (09/05/24 0014)   PRN Meds: acetaminophen  **OR** acetaminophen , albuterol , ondansetron  (ZOFRAN ) IV, senna-docusate  Time spent: 35 minutes  Author: ELVAN [name]. MD [facility] 09/05/2024 5:09 PM  To reach On-call, see care teams to locate the attending and reach out to them via [email or web address]. If 7PM-7AM, please contact night-coverage If you still have difficulty reaching the attending provider, please page the [facility] (Director on Call) for [facility] on amion for assistance.

## 2024-09-05 NOTE — Plan of Care (Addendum)
 Pt has increased HR upon received, MD made aware and med were given. Has also soft BP, given IVF Bolus, still with HR of 120's.  Problem: Health Behavior/Discharge Planning: Goal: Ability to manage health-related needs will improve Outcome: Progressing   Problem: Clinical Measurements: Goal: Will remain free from infection Outcome: Progressing   Problem: Clinical Measurements: Goal: Respiratory complications will improve Outcome: Progressing   Problem: Activity: Goal: Risk for activity intolerance will decrease Outcome: Progressing   Problem: Nutrition: Goal: Adequate nutrition will be maintained Outcome: Progressing   Problem: Elimination: Goal: Will not experience complications related to urinary retention Outcome: Progressing   Problem: Skin Integrity: Goal: Risk for impaired skin integrity will decrease Outcome: Progressing

## 2024-09-06 ENCOUNTER — Other Ambulatory Visit (HOSPITAL_COMMUNITY): Payer: Self-pay

## 2024-09-06 DIAGNOSIS — G9340 Encephalopathy, unspecified: Secondary | ICD-10-CM | POA: Diagnosis not present

## 2024-09-06 LAB — BASIC METABOLIC PANEL WITH GFR
Anion gap: 10 (ref 5–15)
BUN: 17 mg/dL (ref 8–23)
CO2: 26 mmol/L (ref 22–32)
Calcium: 8.7 mg/dL — ABNORMAL LOW (ref 8.9–10.3)
Chloride: 104 mmol/L (ref 98–111)
Creatinine, Ser: 0.84 mg/dL (ref 0.44–1.00)
GFR, Estimated: >60 mL/min
Glucose, Bld: 107 mg/dL — ABNORMAL HIGH (ref 70–99)
Potassium: 4 mmol/L (ref 3.5–5.1)
Sodium: 139 mmol/L (ref 135–145)

## 2024-09-06 LAB — CBC
HCT: 31.8 % — ABNORMAL LOW (ref 36.0–46.0)
Hemoglobin: 9.9 g/dL — ABNORMAL LOW (ref 12.0–15.0)
MCH: 28.4 pg (ref 26.0–34.0)
MCHC: 31.1 g/dL (ref 30.0–36.0)
MCV: 91.4 fL (ref 80.0–100.0)
Platelets: 216 K/uL (ref 150–400)
RBC: 3.48 MIL/uL — ABNORMAL LOW (ref 3.87–5.11)
RDW: 14.6 % (ref 11.5–15.5)
WBC: 10.7 K/uL — ABNORMAL HIGH (ref 4.0–10.5)
nRBC: 0 % (ref 0.0–0.2)

## 2024-09-06 LAB — MAGNESIUM: Magnesium: 2.6 mg/dL — ABNORMAL HIGH (ref 1.7–2.4)

## 2024-09-06 LAB — PHOSPHORUS: Phosphorus: 3.4 mg/dL (ref 2.5–4.6)

## 2024-09-06 MED ORDER — ENSURE PLUS HIGH PROTEIN PO LIQD
237.0000 mL | Freq: Two times a day (BID) | ORAL | Status: DC
  Administered 2024-09-06 – 2024-09-08 (×3): 237 mL via ORAL

## 2024-09-06 MED ORDER — AMIODARONE HCL 200 MG PO TABS: 200.0000 mg | ORAL_TABLET | Freq: Two times a day (BID) | ORAL | Status: DC

## 2024-09-06 MED ORDER — MIDODRINE HCL 5 MG PO TABS
10.0000 mg | ORAL_TABLET | Freq: Three times a day (TID) | ORAL | Status: AC
  Administered 2024-09-06 – 2024-09-08 (×6): 10 mg via ORAL
  Filled 2024-09-06 (×6): qty 2

## 2024-09-06 MED ORDER — AMIODARONE HCL 200 MG PO TABS
400.0000 mg | ORAL_TABLET | Freq: Two times a day (BID) | ORAL | Status: DC
  Administered 2024-09-07 – 2024-09-08 (×3): 400 mg via ORAL
  Filled 2024-09-06 (×3): qty 2

## 2024-09-06 MED ORDER — AMIODARONE HCL 200 MG PO TABS: 100.0000 mg | ORAL_TABLET | Freq: Every day | ORAL | Status: DC

## 2024-09-06 MED ORDER — METOPROLOL TARTRATE 12.5 MG HALF TABLET: 12.5000 mg | ORAL_TABLET | Freq: Two times a day (BID) | ORAL | Status: DC

## 2024-09-06 NOTE — Consult Note (Addendum)
 "  Cardiology Consultation   Patient ID: Michaela [name] MRN: [id number]; DOB: 09-17-1955  Admit date: 09/03/2024 Date of Consult: 09/06/2024  PCP:  [place], [facility] At   [facility] Providers Cardiologist:  Dr Rolan   Patient Profile: Michaela [name] is a 68 y.o. 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  History of Present Illness: Michaela [name] is a 68 y.o. female with prior cardiac history listed below.  In 2013 the patient had a mitral valve repair for severe MR with a bileaflet prolapse.  The repair was done by using an Alfieri stitch and an annuloplasty ring.  The patient was seen for outpatient follow-up in 2016 and after that was lost to follow-up.  At that follow-up appointment the patient was documented as being off Coumadin   On 08/2022- 09/2022 the patient was admitted for new onset A-fib with RVR, a left atrial appendage thrombus, and a splenic infarct.  A TTE was done during this hospitalization and showed a normal LVEF of 60 to 65%, no RWMA, normal RV systolic function, mild MR, and mild MS. The patient was started on Eliquis  5 mg twice daily.  After this hospitalization the patient was again lost to follow-up.  On 09/03/2024 the patient presented to the emergency department for altered mental status, shortness of breath, and tachycardia.  She was found to have an acute UTI and A-fib with RVR.  Blood pressures are soft.  At 11:46 AM today the patient blood pressure was 101/80.  The patient and her sister Luke were present for the interview.  The patient takes a long time to answer  questions and does have some confusion.  The patient is also hard of hearing.  The patient's sister reported that this is about her baseline.  The patient has been in a wheelchair since age 71 because of the astrocytoma.  Over about the past year she has had a worsening baseline confusion and worsening mobility.  The patient report that she sometimes has a sensation in her chest with the atrial fibrillation.  Denies any orthopnea, melena, or hematuria.  The patient's sister reported that it is difficult to transport her to doctors appointments because of her poor mobility.  The patient's sister reported that her shortness of breath was only minimally worse than baseline.  Labs showed potassium of 4.0, creatinine of 0.84, magnesium  of 2.6, WBC count of 10.7, hemoglobin of 9.9, iron  labs within normal limits, UA that was positive for a UTI.  EKG showed atrial fibrillation with a ventricular rate of 130 and appeared to be slightly low voltage.   Past Medical History:  Diagnosis Date   Astrocytoma brain tumor (HCC) 1971   astrocytoma treated with radiation   C. difficile colitis 11/2011   complicated by sepsis/ARDS and prolonged hosp   Depression    Hyperlipidemia    Mitral regurgitation 10/2011   severe, s/p min invasive repair and annuloplasty   Mitral valve prolapse    Neuromuscular disorder (HCC)    numbness in hands and feet   Osteoarthritis    Osteoporosis    DEXA @ [facility] 11/2012: -3.5, prior tx Reclast, last in 2011, rec change  to Prolia    Overactive bladder    Pleural effusion, left 11/11/2011   Vitamin D  deficiency     Past Surgical History:  Procedure Laterality Date   CARDIAC CATHETERIZATION     Jan 2013   KNEE ARTHROSCOPY Right    MITRAL VALVE REPAIR  10/15/2011   Procedure: MINIMALLY INVASIVE MITRAL VALVE REPAIR (MVR);  Surgeon: Sudie VEAR Laine, MD;  Location: [facility] OR;  Service: Open Heart Surgery;  Laterality: Right;   TRANSESOPHAGEAL ECHOCARDIOGRAM  7/12   US  ECHOCARDIOGRAPHY  6/12    VENTRICULOPERITONEAL SHUNT  1971     Home Medications:  Prior to Admission medications  Medication Sig Start Date End Date Taking? Authorizing Provider  albuterol  (ACCUNEB ) 1.25 MG/3ML nebulizer solution Take 1 ampule by nebulization every 6 (six) hours as needed for wheezing.   Yes [provider]  apixaban  (ELIQUIS ) 5 MG TABS tablet Take 1 tablet (5 mg total) by mouth 2 (two) times daily. 09/13/22  Yes Dahal, Chapman, MD  atorvastatin  (LIPITOR) 20 MG tablet Take 20 mg by mouth daily.    Yes [provider]  cholecalciferol (VITAMIN D ) 1000 units tablet Take 1,000 Units by mouth daily.   Yes [provider]  clonazePAM  (KLONOPIN ) 0.5 MG tablet Take 0.25-0.5 mg by mouth in the morning, at noon, and at bedtime. Give 0.5 mg BID & Give 0.25 mg at bedtime. Hold if sedated   Yes [provider]  digoxin  62.5 MCG TABS Take 0.0625 mg by mouth daily. 09/14/22  Yes Dahal, Chapman, MD  docusate sodium  (COLACE) 100 MG capsule Take 100 mg by mouth daily.   Yes [provider]  escitalopram  (LEXAPRO ) 10 MG tablet Take 10 mg by mouth daily.   Yes [provider]  loratadine (CLARITIN) 10 MG tablet Take 10 mg by mouth daily.   Yes [provider]  melatonin 3 MG TABS tablet Take 3 mg by mouth at bedtime.   Yes [provider]  Menthol, Topical Analgesic, (BIOFREEZE) 4 % GEL Apply 1 application  topically 2 (two) times daily as needed. Apply topically BID to sore areas - back   Yes [provider]  Menthol-Zinc Oxide (CALMOSEPTINE) 0.44-20.6 % OINT Apply 1 Application topically 2 (two) times daily. Apply to buttocks   Yes [provider]  metoprolol  tartrate (LOPRESSOR ) 25 MG tablet Take 1 tablet (25 mg total) by mouth 2 (two) times daily. Patient taking differently: Take 12.5 mg by mouth 2 (two) times daily. Hold for SBP<=100 09/13/22  Yes Dahal, Chapman, MD  MYRBETRIQ  25 MG TB24 tablet Take 25 mg by mouth daily.   Yes [provider]  omeprazole (PRILOSEC) 20 MG capsule Take 20 mg by mouth daily.   Yes [provider]  polyethylene glycol (MIRALAX  / GLYCOLAX ) 17 g packet Take 17 g by mouth daily.   Yes [provider]  polyvinyl alcohol  (LIQUIFILM TEARS) 1.4 % ophthalmic solution Place 1 drop into both eyes 4 (four) times daily.    Yes [provider]  levalbuterol  (XOPENEX ) 0.63 MG/3ML nebulizer solution Take 3 mLs (0.63 mg total) by nebulization every 6 (six) hours as needed for wheezing or shortness of breath. 09/13/22 09/13/23  Arlice Chapman, MD  OXYGEN Inhale 2 L into the lungs daily as needed (if needed for shortness of breath).    [provider]    Scheduled Meds:  apixaban   5 mg Oral BID   artificial tears  1 drop Both Eyes QID   atorvastatin   20 mg Oral Daily   clonazepam   0.25 mg Oral QHS   clonazePAM   0.5 mg Oral BID   digoxin   0.0625 mg Oral Daily   docusate sodium   100 mg Oral Daily   escitalopram   10 mg Oral Daily   feeding supplement  237 mL Oral BID BM   melatonin  3 mg Oral QHS   [START ON 09/07/2024] metoprolol  tartrate  12.5 mg Oral BID   midodrine   10 mg Oral TID WC   mirabegron  ER  25 mg Oral Daily   pantoprazole   40 mg Oral Daily   polyethylene glycol  17 g Oral Daily   sodium chloride  flush  3 mL Intravenous Q12H   vitamin B-12  500 mcg Oral Daily   Continuous Infusions:  amiodarone  30 mg/hr (09/06/24 0930)   cefTRIAXone  (ROCEPHIN )  IV 1 g (09/05/24 2128)   PRN Meds: acetaminophen  **OR** acetaminophen , albuterol , ondansetron  (ZOFRAN ) IV, senna-docusate  Allergies:   Allergies[1]  Social History:   Social History   Socioeconomic History   Marital status: Single    Spouse name: Not on file   Number of children: 0   Years of education: Not on file   Highest education level: Not on file  Occupational History   Occupation: disabled  Tobacco Use   Smoking status: Former    Current packs/day: 0.00    Types: Cigarettes    Quit date:  09/09/1990    Years since quitting: 34.0   Smokeless tobacco: Never  Vaping Use   Vaping status: Never Used  Substance and Sexual Activity   Alcohol  use: Not Currently    Alcohol /week: 0.0 standard drinks of alcohol     Comment: rarely   Drug use: No   Sexual activity: Not on file  Other Topics Concern   Not on file  Social History Narrative   [facility] in [facility].      Patient is right-handed. She is a permanent resident of [facility].     Social Drivers of Health   Tobacco Use: Medium Risk (09/03/2024)   Patient History    Smoking Tobacco Use: Former    Smokeless Tobacco Use: Never    Passive Exposure: Not on Actuary Strain: Not on file  Food Insecurity: Not on file  Transportation Needs: Not on file  Physical Activity: Not on file  Stress: Not on file  Social Connections: Not on file  Intimate Partner Violence: Not on file  Depression (EYV7-0): Not on file  Alcohol  Screen: Not on file  Housing: Not on file  Utilities: Not on file  Health Literacy: Not on file    Family History:    Family History  Problem Relation Age of Onset   Coronary artery disease Father 28       died MI   Hyperlipidemia Mother    Heart attack Maternal Grandfather      ROS:  Please see the history of present illness.   All other ROS reviewed and negative.     Physical Exam/Data: Vitals:   09/06/24 0740 09/06/24 1146 09/06/24 1419 09/06/24 1531  BP: 107/75 101/80  (!) 84/66  Pulse: 100 89  88  Resp: 15 15  19   Temp: 98 F (36.7 C) 98 F (36.7 C)  98.4 F (36.9 C)  TempSrc: Oral Oral  Oral  SpO2: 100% 98%  97%  Weight:   55 kg   Height:   4' 9 (1.448 m)  Intake/Output Summary (Last 24 hours) at 09/06/2024 1600 Last data filed at 09/06/2024 0800 Gross per 24 hour  Intake 825.62 ml  Output 500 ml  Net 325.62 ml      09/06/2024    2:19 PM 09/05/2024    5:00 AM 09/13/2022    4:39 AM  Last 3 Weights   Weight (lbs) 121 lb 4.1 oz 121 lb 4.1 oz 108 lb 0.4 oz  Weight (kg) 55 kg 55 kg 49 kg     Body mass index is 26.24 kg/m.  General:  Well nourished, well developed, in no acute distress on room air A&O 2/3 (was confused by what year it was) HEENT: normal Neck: no JVD Vascular: No carotid bruits; Distal pulses 2+ bilaterally Cardiac:  normal S1, S2; irregularly irregular rhythm, no murmur  Lungs:  clear to auscultation bilaterally, no wheezing, rhonchi or rales  Abd: soft, nontender, no hepatomegaly  Ext: no edema Musculoskeletal:  No deformities.  The patient is unable to sit up in bed and requires a wheelchair. Skin: warm and dry  Neuro:  no focal abnormalities noted Psych:  Normal affect   EKG:  The EKG was personally reviewed and demonstrates: atrial fibrillation with a ventricular rate of 130 and appeared to be slightly low voltage. Telemetry:  Telemetry was personally reviewed and demonstrates: Atrial fibrillation with heart rates in the 80s to 100  Relevant CV Studies: Echo pending  Laboratory Data: High Sensitivity Troponin:  No results for input(s): TROPONINIHS in the last 720 hours. No results for input(s): TRNPT in the last 720 hours.    Chemistry Recent Labs  Lab 09/03/24 1759 09/04/24 0415 09/05/24 0632 09/05/24 0959 09/06/24 0249  NA 140 142 139  --  139  K 4.3 3.5 4.1  --  4.0  CL 107 103 105  --  104  CO2 25 27 25   --  26  GLUCOSE 89 102* 103*  --  107*  BUN 23 18 22   --  17  CREATININE 0.73 0.74 0.69  --  0.84  CALCIUM  8.8* 9.2 8.8*  --  8.7*  MG 1.8  --   --  1.8 2.6*  GFRNONAA >60 >60 >60  --  >60  ANIONGAP 9 12 9   --  10    Recent Labs  Lab 09/03/24 1759  PROT 6.4*  ALBUMIN  3.7  AST 15  ALT 9  ALKPHOS 125  BILITOT 0.6   Lipids No results for input(s): CHOL, TRIG, HDL, LABVLDL, LDLCALC, CHOLHDL in the last 168 hours.  Hematology Recent Labs  Lab 09/04/24 0415 09/05/24 0632 09/06/24 0249  WBC 11.1* 9.7 10.7*  RBC 3.79*  3.66* 3.48*  HGB 10.9* 10.6* 9.9*  HCT 34.6* 33.1* 31.8*  MCV 91.3 90.4 91.4  MCH 28.8 29.0 28.4  MCHC 31.5 32.0 31.1  RDW 14.8 14.6 14.6  PLT 222 207 216   Thyroid   Recent Labs  Lab 09/03/24 1759  TSH 2.730    BNP Recent Labs  Lab 09/03/24 1759  PROBNP 4,978.0*    DDimer No results for input(s): DDIMER in the last 168 hours.  Radiology/Studies:  DG Abd 1 View Result Date: 09/03/2024 EXAM: 1 VIEW XRAY OF THE ABDOMEN 09/03/2024 07:18:00 PM COMPARISON: Cervical spine x-ray and chest x-ray 09/03/2024 CLINICAL HISTORY: evaluate for shunt malfunction FINDINGS: LINES, TUBES AND DEVICES: Both VP shunts course below in the esophagus with the tip overlying the abdomen. BOWEL: Nonobstructive bowel gas pattern. SOFT TISSUES: 2 metallic clips overlying the right upper quadrant.  Mitral annular calcification status post repair. BONES: No acute fracture. LUNGS/PLEURAL SPACES: Patchy airspace opacity of the left lower lung zone with likely trace left pleural effusion. IMPRESSION: 1. Ventriculoperitoneal shunt catheters project below the diaphragm with distal tips overlying the abdomen. 2. Nonobstructive bowel gas pattern. 3. Patchy left lower lung airspace opacity with likely trace left pleural effusion. Electronically signed by: Morgane Naveau MD 09/03/2024 09:31 PM EST RP Workstation: [id number]   DG Skull 1-3 Views Result Date: 09/03/2024 EXAM: 3 VIEW(S) XRAY OF THE SKULL 09/03/2024 07:18:00 PM COMPARISON: None available. CLINICAL HISTORY: evaluate for shunt malfunction FINDINGS: BONES: No acute fracture. SINUSES: Unremarkable. SOFT TISSUES: Two right ventriculoperitoneal (VP) shunts are present. One shunt, with a frontal approach, correlates with the medial most shunt noted on the cervical spine x-ray. The second shunt, likely an abandoned posterior approach right VP shunt, correlates with the lateral most noted shunt on cervical spine x-ray and is kinked and calcified in the neck. IMPRESSION: 1.  Two right VP shunts with a frontal approach shunt that correlates with the medial most shunt noted on the cervical spine x-ray. 2. Likely abandoned posterior approach right VP shunt that correlates with the lateral most noted shunt on cervical spine x-ray that is kinked and calcified in the neck. Electronically signed by: Morgane Naveau MD 09/03/2024 09:29 PM EST RP Workstation: [id number]   DG Cervical Spine 1 View Result Date: 09/03/2024 EXAM: 1 VIEW(S) XRAY OF THE CERVICAL SPINE 09/03/2024 07:18:00 PM COMPARISON: None available. CLINICAL HISTORY: AMS (altered mental status). FINDINGS: BONES: Vertebral body heights are maintained. Alignment is normal. DISCS AND DEGENERATIVE CHANGES: No severe degenerative changes. SOFT TISSUES: No prevertebral soft tissue swelling. Two VP shunts are noted. A more lateral right neck shunt demonstrates possible kinking and calcification along the course of the tube. The more medial tube overlying the neck and chest appears continuous with no kinking. The visualized lungs appear clear. IMPRESSION: 1. Two VP shunts are noted. A more lateral right neck shunt demonstrates possible kinking and calcification along the course of the tube. The more medial tube overlying the neck and chest appears continuous with no kinking. Electronically signed by: Morgane Naveau MD 09/03/2024 09:25 PM EST RP Workstation: [id number]   CT Head Wo Contrast Result Date: 09/03/2024 EXAM: CT HEAD WITHOUT CONTRAST 09/03/2024 07:54:45 PM TECHNIQUE: CT of the head was performed without the administration of intravenous contrast. Automated exposure control, iterative reconstruction, and/or weight based adjustment of the mA/kV was utilized to reduce the radiation dose to as low as reasonably achievable. COMPARISON: CT Head dated 09/05/2022 and MRI dated 08/27/2018. CLINICAL HISTORY: Mental status change, unknown cause. FINDINGS: BRAIN AND VENTRICLES: Right frontal and right parietal approach ventriculostomy  catheters in place. Similar postoperative changes of the posterior fossa with areas of dystrophic calcifications along the margin of the dilated 4th ventricle. Similar appearance of atrophy and encephalomalacia throughout the cerebellum. Interval increase in size of the lateral ventricles compared to the 2023 CT. The third ventricle is also increased in caliber compared to prior. Balloond and intrapped appearance of the fourth ventricle is similar to prior. No midline shift. Similar appearance of generalized cerebral volume loss. Left parieto-occipital chronic infarct. Similar encephalomalacia in the right frontal lobe along the ventriculostomy catheter tract. Masslike soft tissue along the dorsal aspect of the clivus which is slightly increased in size compared to the 2023 CT and is significantly increased in size compared to the MRI from 08/27/2018. The soft tissue now measures up to 1.8 x 1.0 cm best  seen on sagittal series 6 image 30. There is indentation of the ventral pons. Recommend contrast enhanced MRI of the brain for further evaluation. No acute hemorrhage. No evidence of acute infarct. No extra-axial collection. ORBITS: No acute abnormality. SINUSES: No acute abnormality. SOFT TISSUES AND SKULL: Postsurgical changes of suboccipital craniectomy. No acute soft tissue abnormality. No skull fracture. IMPRESSION: 1. Interval increase in size of the lateral and third ventricles compared to the 2023 CT. Similar post surgical changes of the posterior fossa and entrapped appearance of the fourth ventricle. 2. Masslike soft tissue along the dorsal aspect of the clivus is slightly increased in size compared to the 2023 CT and significantly increased in size compared to the MRI from 08/27/2018, now measuring up to 1.8 x 1.0 cm with indentation of the ventral pons. Recommend contrast enhanced MRI of the brain for further evaluation. Electronically signed by: Donnice Mania MD 09/03/2024 08:51 PM EST RP Workstation:  [id number]   DG Chest Portable 1 View Result Date: 09/03/2024 EXAM: 1 VIEW(S) XRAY OF THE CHEST 09/03/2024 06:08:00 PM COMPARISON: None available. CLINICAL HISTORY: SOB FINDINGS: LINES, TUBES AND DEVICES: VP shunt tubing traverses right chest wall. LUNGS AND PLEURA: Mildly low lung volumes. There are some interstitial opacities in both lower lungs which may represent atelectasis. Small left pleural effusion. No pneumothorax. HEART AND MEDIASTINUM: Upper normal heart size. Mitral annular calcification. Aortic atherosclerosis (ICD10-I70.0). BONES AND SOFT TISSUES: No acute osseous abnormality. IMPRESSION: 1. Small left pleural effusion. 2. Mild bibasilar interstitial opacities, favored atelectasis. Electronically signed by: Greig Pique MD 09/03/2024 07:26 PM EST RP Workstation: [id number]     Assessment and Plan: MYKERIA GARMAN is a 68 y.o. 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  UTI The patient was hospitalized from a skilled nursing facility because of concerns for altered mental status.  UA indicated that the patient had a UTI.  The patient was started on IV ceftriaxone .  On interview the patient sister reported that her mental status had returned to baseline. Management per primary  MVP s/p mitral valve repair in 2013 Had mild MR and MS on echo in 2023 Echo pending  Atrial fibrillation with RVR CHA2DS2-VASc Score = 6 [CHF History: 1, HTN History: 1, Diabetes History: 0, Stroke History: 2, Vascular Disease History: 0, Age Score: 1, Gender Score: 1].  Therefore, the patient's annual risk of stroke is 9.7 %.  Had a  thromboembolus in 2023. Ventricular rates on telemetry are in the 80s to 100s TSH within normal limits, magnesium  2.6, potassium 4.0. Reported some chest sensation when heart rates are increased. Denied any melena, hematuria, or hematochezia. Echo pending.  Assuming there are no concerns on echo patient will likely be able to be discharged tomorrow. Stop IV amiodarone  Start oral amiodarone  400 mg twice daily for a week, decrease to 200 mg twice daily for a week, and 200 mg daily after that. Continue digoxin  0.0625 mg daily. Continue Eliquis  5 mg twice daily. Patient is wheelchair-bound and difficult to transport so we will need to consider this when scheduling outpatient follow-up.   Otherwise management per primary   Risk Assessment/Risk Scores:  CHA2DS2-VASc Score = 6  This indicates a 9.7% annual risk of stroke. The patient's score is based upon: CHF History: 1 HTN History: 1 Diabetes History: 0 Stroke History: 2 Vascular Disease History: 0 Age Score: 1 Gender Score: 1   For questions or updates, please contact [facility] Please consult [email or web address] for contact info under   Signed, Morse Clause, PA-C  09/06/2024 4:00 PM  History and all data above reviewed.  I personally took the history today, performed the physical exam and formulated the substantive portion of the assessment and plan.  I reviewed all relevant tests and studies. Patient examined.  I agree with the findings as above.  [place] and [place] care.  She is able to be transferred to the bed in wheelchair.  She has to be fed now as she has had a physical decline over the last months.  She has had chronic impairments related to her astrocytoma.  Her sister is an attentive caregiver who stops and sees her at least twice a week and is aware of her medical condition.  She knows she is in atrial fibrillation chronically and thinks that she has good rate control because they would  call her from the facility if the heart rate was elevated as it was the other day.  She is unable to transport her sister anymore to appointments and would prefer not to.  Sister presented with elevated heart rate and cognitive decline and now seems to be back to baseline.  Being treated for UTI.  Her atrial fibrillation rate was elevated.  She is on IV amiodarone .  Blood pressure is low and would not allow med titration.  She has chronically tolerated anticoagulation.  The patient exam reveals RNM:Pmmzhlojm, no murmurs  ,  Lungs: Clear, decreased breath sounds bilaterally,  Abd: Positive bowel sounds, no rebound, guarding, Ext 2+ pulses, no edema.  All available labs, radiology testing, previous records reviewed. Agree with documented assessment and plan.  Atrial fibrillation with rapid rate: I think her rate typically is well-controlled judging from what her sister understands.  I think this is followed routinely in her skilled nursing.  She tolerates anticoagulation.  At this point I would start amiodarone  oral and we will switch that tonight from the IV.  Blood pressure will not allow up titration of AV nodal blocking agents.  We will write for an amiodarone  taper with her eventual maintenance dose being 100 mg.  Will also arrange a virtual follow-up to make sure she gets occasional blood work.  She might ultimately not need chronic amiodarone  but she does not have an absolute contraindication if this continues.  I had a long discussion with the patient's sister who is at the bedside.  Patient also seems to understand.  We will check another echocardiogram as it has been a couple of years.  However, she would not be a candidate for any further intervention.   I think she would be okay for discharge tomorrow per the primary team.  Lynwood Schilling  4:14 PM  09/06/2024     [1]  Allergies Allergen Reactions   Compazine Other (See Comments)    eyes get buggy   Prochlorperazine    Prochlorperazine Edisylate     Prochlorperazine Maleate    "

## 2024-09-06 NOTE — Progress Notes (Signed)
 [facility] Progress Note  Patient: Michaela [name]    FMW:[id number]  DOA: 09/03/2024     Date of Service: the patient was seen and examined on 09/06/2024  Chief Complaint  Patient presents with   Shortness of Breath   Brief hospital course:  Medical records reviewed and are as summarized below:   Michaela [name] is a 68 y.o. 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 Heart rate was as high as the 150s and EMS was called.   She was found to have acute UTI, atrial fibrillation with RVR.         Assessment and Plan:    Acute UTI: Continue IV ceftriaxone .  Follow-up urine culture 12/28 urine culture growing Klebsiella pneumonia 100K, and Proteus Mirabilis 230k Klebsiella sensitive to ceftriaxone  Transition to oral antibiotics on discharge  Acute metabolic encephalopathy: Mental status has improved. She has underlying cognitive impairment. 12/28 AAO x 3, slow to respond    # Atrial fibrillation with RVR:  S/p IV Cardizem  infusion.  Continue digoxin .   Restart home metoprolol  and Eliquis  12/28 A-fib with RVR, with low blood pressure, started amiodarone  IV infusion  IV fluid bolus given due to hypotension Keep magnesium  2.0 and potassium 4.0 Cardiology consulted, Rec to start oral amiodarone  400 mg p.o. twice daily x 1 week, 200 mg p.o. twice daily x 1 week followed by 200 mg p.o. daily and continue Eliquis , follow-up as an outpatient. 12/29 low blood pressure, started midodrine  10 mg p.o. 3 times daily with holding parameters  hydrocephalus with VP shunt in place: She has been evaluated by neurosurgeon, Dr. Louis.  No VP  shunt malfunction     Comorbidities include anxiety, hearing impairment, dysphagia on modified diet  Anemia, monitor H&H B12 386, goal 400, started oral supplement Iron  within normal range  Body mass index is 26.24 kg/m.  Interventions:  Diet: Dysphagia 2 diet DVT Prophylaxis: Eliquis   Advance goals of care discussion: DNR-limited  Family Communication: family was present at bedside, at the time of interview.  The pt provided permission to discuss medical plan with the family. Opportunity was given to ask question and all questions were answered satisfactorily.   Disposition:  Pt is from SNF , admitted with AMS,UTI and A-fib with RVR, still has A-fib with RVR and on Amio IV infusion and IV antibiotics, which precludes a safe discharge. Discharge to SNF, when stable, most likely tomorrow a.m.  Subjective: No significant events overnight, patient's mental status is gradually improving, she was laying comfortably, denied any complaints.  Physical Exam: General: NAD, lying comfortably Appear in no distress, affect appropriate Eyes: PERRLA.  Keeps left eye closed due to unknown reason ENT: Oral Mucosa Clear, moist  Neck: no JVD,  Cardiovascular: Irregular rhythm, no Murmur,  Respiratory: good respiratory effort, Bilateral Air entry equal and Decreased, no Crackles, no wheezes Abdomen: Bowel Sound present, Soft and no tenderness,  Skin: no rashes Extremities: no Pedal edema, no calf tenderness Neurologic: without any new focal findings Gait not checked due to patient safety concerns  Vitals:   09/06/24 0740 09/06/24 1146 09/06/24 1419 09/06/24 1531  BP: 107/75 101/80  (!) 84/66  Pulse: 100 89  88  Resp: 15 15  19   Temp: 98  F (36.7 C) 98 F (36.7 C)  98.4 F (36.9 C)  TempSrc: Oral Oral  Oral  SpO2: 100% 98%  97%  Weight:   55 kg   Height:   4' 9 (1.448 m)     Intake/Output Summary (Last 24 hours) at 09/06/2024 1707 Last data filed at 09/06/2024 0800 Gross per 24  hour  Intake 825.62 ml  Output 500 ml  Net 325.62 ml   Filed Weights   09/05/24 0500 09/06/24 1419  Weight: 55 kg 55 kg    Data Reviewed: I have personally reviewed and interpreted daily labs, tele strips, imagings as discussed above. I reviewed all nursing notes, pharmacy notes, vitals, pertinent old records I have discussed plan of care as described above with RN and patient/family.  CBC: Recent Labs  Lab 09/03/24 1759 09/04/24 0415 09/05/24 0632 09/06/24 0249  WBC 8.8 11.1* 9.7 10.7*  NEUTROABS 5.7  --   --   --   HGB 11.0* 10.9* 10.6* 9.9*  HCT 36.1 34.6* 33.1* 31.8*  MCV 92.8 91.3 90.4 91.4  PLT 232 222 207 216   Basic Metabolic Panel: Recent Labs  Lab 09/03/24 1759 09/04/24 0415 09/05/24 0632 09/05/24 0959 09/06/24 0249  NA 140 142 139  --  139  K 4.3 3.5 4.1  --  4.0  CL 107 103 105  --  104  CO2 25 27 25   --  26  GLUCOSE 89 102* 103*  --  107*  BUN 23 18 22   --  17  CREATININE 0.73 0.74 0.69  --  0.84  CALCIUM  8.8* 9.2 8.8*  --  8.7*  MG 1.8  --   --  1.8 2.6*  PHOS  --   --   --  2.8 3.4    Studies: No results found.  Scheduled Meds:  [START ON 09/21/2024] amiodarone   100 mg Oral Daily   [START ON 09/14/2024] amiodarone   200 mg Oral BID   [START ON 09/07/2024] amiodarone   400 mg Oral BID   apixaban   5 mg Oral BID   artificial tears  1 drop Both Eyes QID   atorvastatin   20 mg Oral Daily   clonazepam   0.25 mg Oral QHS   clonazePAM   0.5 mg Oral BID   digoxin   0.0625 mg Oral Daily   docusate sodium   100 mg Oral Daily   escitalopram   10 mg Oral Daily   feeding supplement  237 mL Oral BID BM   melatonin  3 mg Oral QHS   [START ON 09/07/2024] metoprolol  tartrate  12.5 mg Oral BID   midodrine   10 mg Oral TID WC   mirabegron  ER  25 mg Oral Daily   pantoprazole   40 mg Oral Daily   polyethylene glycol  17 g Oral Daily   sodium chloride  flush  3 mL Intravenous Q12H   vitamin B-12  500 mcg Oral Daily   Continuous Infusions:  cefTRIAXone  (ROCEPHIN )   IV 1 g (09/05/24 2128)   PRN Meds: acetaminophen  **OR** acetaminophen , albuterol , ondansetron  (ZOFRAN ) IV, senna-docusate  Time spent: 35 minutes  Author: ELVAN SOR. MD [facility] 09/06/2024 5:07 PM  To reach On-call, see care teams to locate the attending and reach out to them via [email or web address]. If 7PM-7AM, please contact night-coverage If you still have difficulty reaching the attending provider, please page the [facility] (Director on Call) for [facility] on amion for assistance.

## 2024-09-06 NOTE — Discharge Instructions (Signed)

## 2024-09-06 NOTE — Care Management Important Message (Signed)
 Important Message  Patient Details  Name: Michaela [name] MRN: [id number] Date of Birth: September 30, 1955   Important Message Given:  Yes - Medicare IM     Claretta Deed 09/06/2024, 2:48 PM

## 2024-09-06 NOTE — Plan of Care (Signed)
 ?  Problem: Clinical Measurements: ?Goal: Ability to maintain clinical measurements within normal limits will improve ?Outcome: Progressing ?Goal: Will remain free from infection ?Outcome: Progressing ?Goal: Diagnostic test results will improve ?Outcome: Progressing ?  ?

## 2024-09-06 NOTE — Care Management Important Message (Signed)
 Important Message  Patient Details  Name: Michaela [name] MRN: [id number] Date of Birth: 09/18/1955   Important Message Given:  Yes - Medicare IM     Claretta Deed 09/06/2024, 2:50 PM

## 2024-09-06 NOTE — TOC Initial Note (Signed)
 Transition of Care [facility]) - Initial/Assessment Note    Patient Details  Name: Michaela [name] MRN: [id number] Date of Birth: 1955/12/16  Transition of Care [facility]) CM/SW Contact:    Andrez JULIANNA George, RN Phone Number: 09/06/2024, 1:39 PM  Clinical Narrative:                 Pt is from [facility] prior to admission. Plan is for patient to return when medically ready.  Pt will need PTAR to [facility] at d/c.  IP Care management following.  Expected Discharge Plan: Skilled Nursing Facility Barriers to Discharge: Continued Medical Work up   Patient Goals and CMS Choice   CMS Medicare.gov Compare Post Acute Care list provided to:: Patient Choice offered to / list presented to : Patient, Sibling      Expected Discharge Plan and Services In-house Referral: Clinical Social Work   Post Acute Care Choice: Skilled Nursing Facility Living arrangements for the past 2 months: Skilled Nursing Facility                                      Prior Living Arrangements/Services Living arrangements for the past 2 months: Skilled Nursing Facility Lives with:: Facility Resident Patient language and need for interpreter reviewed:: Yes Do you feel safe going back to the place where you live?: Yes        Care giver support system in place?: Yes (comment)   Criminal Activity/Legal Involvement Pertinent to Current Situation/Hospitalization: No - Comment as needed  Activities of Daily Living      Permission Sought/Granted                  Emotional Assessment Appearance:: Appears stated age   Affect (typically observed): Quiet     Psych Involvement: No (comment)  Admission diagnosis:  Anasarca [R60.1] Atrial fibrillation with rapid ventricular response (HCC) [I48.91] Acute cystitis without hematuria [N30.00] Acute encephalopathy [G93.40] Brain lesion [G93.9] Altered mental status, unspecified altered mental status type [R41.82] Hydrocephalus, unspecified type [facility])  [G91.9] Patient Active Problem List   Diagnosis Date Noted   Acute encephalopathy 09/03/2024   Obstructive hydrocephalus (HCC) 09/03/2024   Acute cystitis 09/03/2024   RSV (acute bronchiolitis due to respiratory syncytial virus) 09/06/2022   Embolism and thrombosis (HCC) 09/06/2022   Thrombus of left atrial appendage 09/06/2022   Atrial fibrillation with rapid ventricular response (HCC) 09/06/2022   Dry eyes 06/25/2019   Tremors of nervous system 05/24/2019   Overactive bladder    Rash 01/01/2019   Neurocognitive deficits 09/03/2018   Swallowing dysfunction 05/07/2018   Bilateral shoulder pain 01/06/2017   Chronic constipation 03/21/2016   Loss of weight    Vitamin D  deficiency    Osteoporosis    Hyperlipidemia    Paroxysmal atrial fibrillation (HCC) 03/03/2012   Pituitary disorder 02/11/2012   Ataxia 01/29/2012   Atrial fibrillation with RVR (HCC) 11/22/2011   Normochromic normocytic anemia 11/21/2011   Diastolic CHF, chronic (HCC) 11/13/2011   Pleural effusion, left 11/11/2011   S/P mitral valve repair 10/15/2011   Brain tumor, astrocytoma (HCC) 10/15/2011   Neuromuscular disorder (HCC) 10/15/2011   Neurogenic bladder 10/15/2011   Arthritis 10/15/2011   Mitral regurgitation due to cusp prolapse 07/08/2011   PCP:  [place], [facility] At Pharmacy:   [facility] - [place], [place] - [address] [place] [address] Phone: [phone] Fax: [phone]  Social Drivers of Health (SDOH) Social History: SDOH Screenings   Tobacco Use: Medium Risk (09/03/2024)   SDOH Interventions:     Readmission Risk Interventions     No data to display

## 2024-09-07 ENCOUNTER — Encounter (HOSPITAL_COMMUNITY): Payer: Self-pay | Admitting: Family Medicine

## 2024-09-07 ENCOUNTER — Inpatient Hospital Stay (HOSPITAL_COMMUNITY)

## 2024-09-07 DIAGNOSIS — G9340 Encephalopathy, unspecified: Secondary | ICD-10-CM | POA: Diagnosis not present

## 2024-09-07 DIAGNOSIS — I4891 Unspecified atrial fibrillation: Secondary | ICD-10-CM

## 2024-09-07 LAB — CBC
HCT: 33.5 % — ABNORMAL LOW (ref 36.0–46.0)
Hemoglobin: 10.5 g/dL — ABNORMAL LOW (ref 12.0–15.0)
MCH: 28.2 pg (ref 26.0–34.0)
MCHC: 31.3 g/dL (ref 30.0–36.0)
MCV: 89.8 fL (ref 80.0–100.0)
Platelets: 231 K/uL (ref 150–400)
RBC: 3.73 MIL/uL — ABNORMAL LOW (ref 3.87–5.11)
RDW: 14.7 % (ref 11.5–15.5)
WBC: 11.2 K/uL — ABNORMAL HIGH (ref 4.0–10.5)
nRBC: 0 % (ref 0.0–0.2)

## 2024-09-07 LAB — URINE CULTURE: Culture: >=100000 — AB

## 2024-09-07 LAB — BASIC METABOLIC PANEL WITH GFR
Anion gap: 10 (ref 5–15)
BUN: 19 mg/dL (ref 8–23)
CO2: 24 mmol/L (ref 22–32)
Calcium: 9.1 mg/dL (ref 8.9–10.3)
Chloride: 103 mmol/L (ref 98–111)
Creatinine, Ser: 0.8 mg/dL (ref 0.44–1.00)
GFR, Estimated: >60 mL/min
Glucose, Bld: 100 mg/dL — ABNORMAL HIGH (ref 70–99)
Potassium: 4.3 mmol/L (ref 3.5–5.1)
Sodium: 137 mmol/L (ref 135–145)

## 2024-09-07 LAB — ECHOCARDIOGRAM COMPLETE
Height: 57 in
S' Lateral: 3.3 cm
Weight: 1940.05 [oz_av]

## 2024-09-07 MED ORDER — METOPROLOL SUCCINATE ER 25 MG PO TB24: 12.5000 mg | ORAL_TABLET | Freq: Every day | ORAL | Status: DC

## 2024-09-07 NOTE — Progress Notes (Signed)
" °  Echocardiogram 2D Echocardiogram has been performed.  Tinnie FORBES Gosling RDCS 09/07/2024, 10:29 AM "

## 2024-09-07 NOTE — Progress Notes (Signed)
 "   Progress Note  Patient Name: Michaela [name] Date of Encounter: 09/07/2024  Primary Cardiologist:   None   Subjective   She has no acute complaints today.   Inpatient Medications    Scheduled Meds:  [START ON 09/21/2024] amiodarone   100 mg Oral Daily   [START ON 09/14/2024] amiodarone   200 mg Oral BID   amiodarone   400 mg Oral BID   apixaban   5 mg Oral BID   artificial tears  1 drop Both Eyes QID   atorvastatin   20 mg Oral Daily   clonazepam   0.25 mg Oral QHS   clonazePAM   0.5 mg Oral BID   digoxin   0.0625 mg Oral Daily   docusate sodium   100 mg Oral Daily   escitalopram   10 mg Oral Daily   feeding supplement  237 mL Oral BID BM   melatonin  3 mg Oral QHS   metoprolol  tartrate  12.5 mg Oral BID   midodrine   10 mg Oral TID WC   mirabegron  ER  25 mg Oral Daily   pantoprazole   40 mg Oral Daily   polyethylene glycol  17 g Oral Daily   sodium chloride  flush  3 mL Intravenous Q12H   vitamin B-12  500 mcg Oral Daily   Continuous Infusions:  cefTRIAXone  (ROCEPHIN )  IV 1 g (09/06/24 2123)   PRN Meds: acetaminophen  **OR** acetaminophen , albuterol , ondansetron  (ZOFRAN ) IV, senna-docusate   Vital Signs    Vitals:   09/06/24 1913 09/06/24 2320 09/07/24 0320 09/07/24 0712  BP: 107/64 103/70 97/72 112/88  Pulse: (!) 104 88 99   Resp: 15 19 17 20   Temp: 98 F (36.7 C) (!) 97.5 F (36.4 C) 97.6 F (36.4 C) 98.1 F (36.7 C)  TempSrc: Oral Axillary Oral Oral  SpO2: 91% 92% 94%   Weight:      Height:        Intake/Output Summary (Last 24 hours) at 09/07/2024 0845 Last data filed at 09/07/2024 0400 Gross per 24 hour  Intake 220 ml  Output 900 ml  Net -680 ml   Filed Weights   09/05/24 0500 09/06/24 1419  Weight: 55 kg 55 kg    Telemetry    Atrial fib.  Rate is mildly elevated.  - Personally Reviewed  ECG    NA - Personally Reviewed  Physical Exam   GEN: No acute distress.   Neck: No  JVD Cardiac: Irregular RR, no murmurs, rubs, or gallops.   Respiratory: Clear  to auscultation bilaterally. GI: Soft, nontender, non-distended  MS: No  edema; No deformity. Neuro:  Nonfocal  Psych: Normal affect   Labs    Chemistry Recent Labs  Lab 09/03/24 1759 09/04/24 0415 09/05/24 9367 09/06/24 0249 09/07/24 0152  NA 140   < > 139 139 137  K 4.3   < > 4.1 4.0 4.3  CL 107   < > 105 104 103  CO2 25   < > 25 26 24   GLUCOSE 89   < > 103* 107* 100*  BUN 23   < > 22 17 19   CREATININE 0.73   < > 0.69 0.84 0.80  CALCIUM  8.8*   < > 8.8* 8.7* 9.1  PROT 6.4*  --   --   --   --   ALBUMIN  3.7  --   --   --   --   AST 15  --   --   --   --   ALT 9  --   --   --   --  ALKPHOS 125  --   --   --   --   BILITOT 0.6  --   --   --   --   GFRNONAA >60   < > >60 >60 >60  ANIONGAP 9   < > 9 10 10    < > = values in this interval not displayed.     Hematology Recent Labs  Lab 09/05/24 9367 09/06/24 0249 09/07/24 0152  WBC 9.7 10.7* 11.2*  RBC 3.66* 3.48* 3.73*  HGB 10.6* 9.9* 10.5*  HCT 33.1* 31.8* 33.5*  MCV 90.4 91.4 89.8  MCH 29.0 28.4 28.2  MCHC 32.0 31.1 31.3  RDW 14.6 14.6 14.7  PLT 207 216 231    Cardiac EnzymesNo results for input(s): TROPONINI in the last 168 hours. No results for input(s): TROPIPOC in the last 168 hours.   BNP Recent Labs  Lab 09/03/24 1759  PROBNP 4,978.0*     DDimer No results for input(s): DDIMER in the last 168 hours.   Radiology    No results found.  Cardiac Studies   Echo:  1. Left ventricular ejection fraction, by estimation, is 60 to 65%. The  left ventricle has normal function. The left ventricle has no regional  wall motion abnormalities. Left ventricular diastolic function could not  be evaluated.   2. Right ventricular systolic function is normal. The right ventricular  size is normal. There is normal pulmonary artery systolic pressure.   3. Left atrial size was mildly dilated.   4. The mitral valve is degenerative. Mild mitral valve regurgitation.  Mild mitral  stenosis. The mean mitral valve gradient is 7.5 mmHg with  average heart rate of 120 bpm. There is a 36 prosthetic prosthetic  annuloplasty ring present in the mitral  position.   5. The aortic valve is tricuspid. Aortic valve regurgitation is not  visualized. No aortic stenosis is present.   6. The inferior vena cava is normal in size with greater than 50%  respiratory variability, suggesting right atrial pressure of 3 mmHg.    Patient Profile     68 y.o. 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   Assessment & Plan    UTI:  Treatment per primary team.    MVP:  Echo 12/28 with NL LV function. There is some mild MR.  No change in therapy.  No further imaging.  Atrial fib:      She tolerates Eliquis .  We have started PO amiodarone  taper for rate control.  She remains on very low dose digoxin .  She cannot be transported to the office easily for follow appts so we will arrange follow up virtual visits with the EP clinic.  Labs, including a future dig level given the addition of amio, can be ordered through her skilled care facility.   She has a follow up virtual visit on Feb 25.  Her should be alerted to the timing of that so that she is able to facilitate the visit.     For questions or updates, please contact [facility] Please consult [email or web address] for contact info under Cardiology/STEMI.   Signed, Lynwood Schilling, MD  09/07/2024, 8:45 AM    "

## 2024-09-07 NOTE — Progress Notes (Signed)
 [facility] Progress Note  Patient: Michaela [name]    FMW:[id number]  DOA: 09/03/2024     Date of Service: the patient was seen and examined on 09/07/2024  Chief Complaint  Patient presents with   Shortness of Breath   Brief hospital course:  Medical records reviewed and are as summarized below:   Michaela [name] is a 68 y.o. 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 Heart rate was as high as the 150s and EMS was called.   She was found to have acute UTI, atrial fibrillation with RVR.         Assessment and Plan:    Acute UTI: Continue IV ceftriaxone .  Follow-up urine culture 12/28 urine culture growing Klebsiella pneumonia 100K, and Proteus Mirabilis 230k Klebsiella sensitive to ceftriaxone  Transition to oral antibiotics on discharge  Acute metabolic encephalopathy: Mental status has improved. She has underlying cognitive impairment. 12/28 AAO x 3, slow to respond    # Atrial fibrillation with RVR:  S/p IV Cardizem  infusion.  Continue digoxin .   Restart home metoprolol  and Eliquis  12/28 A-fib with RVR, with low blood pressure, started amiodarone  IV infusion  IV fluid bolus given due to hypotension Keep magnesium  2.0 and potassium 4.0 Cardiology consulted, Rec to start oral amiodarone  400 mg p.o. twice daily x 1 week, 200 mg p.o. twice daily x 1 week followed by 200 mg p.o. daily and continue Eliquis , follow-up as an outpatient. 12/29 low blood pressure, started midodrine  10 mg p.o. 3 times daily with holding parameters 12/30 low BP, discontinued metoprolol  tartrate, started Toprol -XL 12.5 mg p.o. daily with holding  parameters. Continue midodrine  to support BP Still patient has A-fib with RVR heart rate >110  hydrocephalus with VP shunt in place: She has been evaluated by neurosurgeon, Dr. Louis.  No VP shunt malfunction     Comorbidities include anxiety, hearing impairment, dysphagia on modified diet  Anemia, monitor H&H B12 386, goal 400, started oral supplement Iron  within normal range  Body mass index is 26.24 kg/m.  Interventions:  Diet: Dysphagia 2 diet DVT Prophylaxis: Eliquis   Advance goals of care discussion: DNR-limited  Family Communication: family was present at bedside, at the time of interview.  The pt provided permission to discuss medical plan with the family. Opportunity was given to ask question and all questions were answered satisfactorily.   Disposition:  Pt is from SNF , admitted with AMS,UTI and A-fib with RVR, still has A-fib with RVR, s/p Amio IV infusion and IV antibiotics, which precludes a safe discharge. Discharge to SNF, when stable, most likely tomorrow a.m.  Subjective: No significant events overnight, patient's mental status is gradually improving, she was laying comfortably, denied any complaints.  Physical Exam: General: NAD, lying comfortably Appear in no distress, affect appropriate Eyes: PERRLA.  Keeps left eye closed due to unknown reason ENT: Oral Mucosa Clear, moist  Neck: no JVD,  Cardiovascular: Irregular rhythm, no Murmur,  Respiratory: good respiratory effort, Bilateral Air entry equal and Decreased, no Crackles, no wheezes Abdomen: Bowel Sound present, Soft and no tenderness,  Skin: no rashes Extremities: no Pedal edema, no calf tenderness Neurologic: without any new focal findings Gait not checked due to patient safety concerns  Vitals:   09/07/24 0320  09/07/24 0712 09/07/24 1112 09/07/24 1521  BP: 97/72 112/88 (!) 116/90 122/82  Pulse: 99   (!) 112  Resp: 17 20 16 18   Temp: 97.6 F (36.4 C) 98.1 F (36.7 C) 98.4 F (36.9 C) 97.9  F (36.6 C)  TempSrc: Oral Oral Oral Oral  SpO2: 94%   96%  Weight:      Height:        Intake/Output Summary (Last 24 hours) at 09/07/2024 1550 Last data filed at 09/07/2024 1200 Gross per 24 hour  Intake 700 ml  Output 900 ml  Net -200 ml   Filed Weights   09/05/24 0500 09/06/24 1419  Weight: 55 kg 55 kg    Data Reviewed: I have personally reviewed and interpreted daily labs, tele strips, imagings as discussed above. I reviewed all nursing notes, pharmacy notes, vitals, pertinent old records I have discussed plan of care as described above with RN and patient/family.  CBC: Recent Labs  Lab 09/03/24 1759 09/04/24 0415 09/05/24 0632 09/06/24 0249 09/07/24 0152  WBC 8.8 11.1* 9.7 10.7* 11.2*  NEUTROABS 5.7  --   --   --   --   HGB 11.0* 10.9* 10.6* 9.9* 10.5*  HCT 36.1 34.6* 33.1* 31.8* 33.5*  MCV 92.8 91.3 90.4 91.4 89.8  PLT 232 222 207 216 231   Basic Metabolic Panel: Recent Labs  Lab 09/03/24 1759 09/04/24 0415 09/05/24 0632 09/05/24 0959 09/06/24 0249 09/07/24 0152  NA 140 142 139  --  139 137  K 4.3 3.5 4.1  --  4.0 4.3  CL 107 103 105  --  104 103  CO2 25 27 25   --  26 24  GLUCOSE 89 102* 103*  --  107* 100*  BUN 23 18 22   --  17 19  CREATININE 0.73 0.74 0.69  --  0.84 0.80  CALCIUM  8.8* 9.2 8.8*  --  8.7* 9.1  MG 1.8  --   --  1.8 2.6*  --   PHOS  --   --   --  2.8 3.4  --     Studies: ECHOCARDIOGRAM COMPLETE Result Date: 09/07/2024    ECHOCARDIOGRAM REPORT   Patient Name:   Michaela [name] Date of Exam: 09/07/2024 Medical Rec #:  [id number]           Height:       57.0 in Accession #:    [id number]          Weight:       121.3 lb Date of Birth:  1956/07/18           BSA:          1.454 m Patient Age:    68 years            BP:           112/88 mmHg Patient Gender: F                   HR:           118 bpm. Exam Location:  Inpatient Procedure: 2D Echo, Cardiac Doppler and Color Doppler (Both Spectral and Color            Flow Doppler were  utilized during procedure). Indications:    Atrial FIbrillation I48.91  History:        Patient has prior history of Echocardiogram examinations, most                 recent  09/06/2022. Mitral Valve Disease; Risk                 Factors:Dyslipidemia.                  Mitral Valve: 36 MM Carbomedics mitral ring valve is present in                 the mitral position. Procedure Date: 2013.  Sonographer:    Tinnie Gosling RDCS Referring Phys: [id number] ZANE ADAMS IMPRESSIONS  1. Left ventricular ejection fraction, by estimation, is 55 to 60%. The left ventricle has normal function. The left ventricle has no regional wall motion abnormalities. There is mild concentric left ventricular hypertrophy. Left ventricular diastolic parameters are indeterminate.  2. Right ventricular systolic function is mildly reduced. The right ventricular size is normal. There is normal pulmonary artery systolic pressure. The estimated right ventricular systolic pressure is 27.4 mmHg.  3. Left atrial size was moderately dilated.  4. S/p mitral valve repair with Alfieri stitch and annuloplasty ring. Trivial mitral regurgitation noted. Mean gradient 9 mmHg across the valve (prior gradient reported as 7.5 mmHg). Possible moderate stenosis but not markedly different than in the past.  5. The aortic valve is tricuspid. Aortic valve regurgitation is not visualized. No aortic stenosis is present.  6. The inferior vena cava is normal in size with <50% respiratory variability, suggesting right atrial pressure of 8 mmHg. FINDINGS  Left Ventricle: Left ventricular ejection fraction, by estimation, is 55 to 60%. The left ventricle has normal function. The left ventricle has no regional wall motion abnormalities. The left ventricular internal cavity size was normal in size. There is  mild concentric left ventricular hypertrophy. Left ventricular diastolic parameters are indeterminate. Right Ventricle: The right ventricular size is normal. No increase in  right ventricular wall thickness. Right ventricular systolic function is mildly reduced. There is normal pulmonary artery systolic pressure. The tricuspid regurgitant velocity is 2.20 m/s, and with an assumed right atrial pressure of 8 mmHg, the estimated right ventricular systolic pressure is 27.4 mmHg. Left Atrium: Left atrial size was moderately dilated. Right Atrium: Right atrial size was normal in size. Pericardium: There is no evidence of pericardial effusion. Mitral Valve: S/p mitral valve repair with Alfieri stitch and annuloplasty ring. Trivial mitral regurgitation noted. Mean gradient 9 mmHg across the valve (prior gradient reported as 7.5). Possible moderate stenosis but not markedly different than in the  past. There is a 36 MM Carbomedics mitral ring present in the mitral position. Procedure Date: 2013. Tricuspid Valve: The tricuspid valve is normal in structure. Tricuspid valve regurgitation is trivial. Aortic Valve: The aortic valve is tricuspid. Aortic valve regurgitation is not visualized. No aortic stenosis is present. Pulmonic Valve: The pulmonic valve was normal in structure. Pulmonic valve regurgitation is trivial. Aorta: The aortic root is normal in size and structure. Venous: The inferior vena cava is normal in size with less than 50% respiratory variability, suggesting right atrial pressure of 8 mmHg. IAS/Shunts: No atrial level shunt detected by color flow Doppler.  LEFT VENTRICLE PLAX 2D LVIDd:         4.60 cm LVIDs:         3.30 cm LV PW:         1.20 cm LV IVS:        1.20 cm LVOT diam:     1.80 cm LV SV:         26 LV SV Index:   18 LVOT Area:  2.54 cm  RIGHT VENTRICLE            IVC RV S prime:     7.95 cm/s  IVC diam: 1.60 cm LEFT ATRIUM           Index        RIGHT ATRIUM           Index LA diam:      4.30 cm 2.96 cm/m   RA Area:     12.00 cm LA Vol (A4C): 40.0 ml 27.51 ml/m  RA Volume:   28.30 ml  19.46 ml/m  AORTIC VALVE LVOT Vmax:   76.00 cm/s LVOT Vmean:  44.000 cm/s LVOT  VTI:    0.104 m  AORTA Ao Root diam: 2.70 cm Ao Asc diam:  2.70 cm TRICUSPID VALVE TR Peak grad:   19.4 mmHg TR Vmax:        220.00 cm/s  SHUNTS Systemic VTI:  0.10 m Systemic Diam: 1.80 cm Dalton McleanMD Electronically signed by Ezra Kanner Signature Date/Time: 09/07/2024/2:02:45 PM    Final     Scheduled Meds:  [START ON 09/21/2024] amiodarone   100 mg Oral Daily   [START ON 09/14/2024] amiodarone   200 mg Oral BID   amiodarone   400 mg Oral BID   apixaban   5 mg Oral BID   artificial tears  1 drop Both Eyes QID   atorvastatin   20 mg Oral Daily   clonazepam   0.25 mg Oral QHS   clonazePAM   0.5 mg Oral BID   digoxin   0.0625 mg Oral Daily   docusate sodium   100 mg Oral Daily   escitalopram   10 mg Oral Daily   feeding supplement  237 mL Oral BID BM   melatonin  3 mg Oral QHS   [START ON 09/08/2024] metoprolol  succinate  12.5 mg Oral Daily   midodrine   10 mg Oral TID WC   mirabegron  ER  25 mg Oral Daily   pantoprazole   40 mg Oral Daily   polyethylene glycol  17 g Oral Daily   sodium chloride  flush  3 mL Intravenous Q12H   vitamin B-12  500 mcg Oral Daily   Continuous Infusions:  cefTRIAXone  (ROCEPHIN )  IV 1 g (09/06/24 2123)   PRN Meds: acetaminophen  **OR** acetaminophen , albuterol , ondansetron  (ZOFRAN ) IV, senna-docusate  Time spent: 35 minutes  Author: ELVAN SOR. MD [facility] 09/07/2024 3:50 PM  To reach On-call, see care teams to locate the attending and reach out to them via [email or web address]. If 7PM-7AM, please contact night-coverage If you still have difficulty reaching the attending provider, please page the [facility] (Director on Call) for [facility] on amion for assistance.

## 2024-09-07 NOTE — [place] FL2 (Signed)
 " [place]  MEDICAID FL2 LEVEL OF CARE FORM     IDENTIFICATION  Patient Name: Michaela [name] Birthdate: 1955-12-19 Sex: female Admission Date (Current Location): 09/03/2024  [facility] and [place] Number:  Producer, Television/film/video and Address:  [facility], [address], [place], [place] [address]      Provider Number: [id number]  Attending Physician Name and Address:  Von Bellis, MD  Relative Name and Phone Number:       Current Level of Care: Hospital Recommended Level of Care: Skilled Nursing Facility Prior Approval Number:    Date Approved/Denied:   PASRR Number:    Discharge Plan: SNF    Current Diagnoses: Patient Active Problem List   Diagnosis Date Noted   Acute encephalopathy 09/03/2024   Obstructive hydrocephalus (HCC) 09/03/2024   Acute cystitis 09/03/2024   RSV (acute bronchiolitis due to respiratory syncytial virus) 09/06/2022   Embolism and thrombosis (HCC) 09/06/2022   Thrombus of left atrial appendage 09/06/2022   Atrial fibrillation with rapid ventricular response (HCC) 09/06/2022   Dry eyes 06/25/2019   Tremors of nervous system 05/24/2019   Overactive bladder    Rash 01/01/2019   Neurocognitive deficits 09/03/2018   Swallowing dysfunction 05/07/2018   Bilateral shoulder pain 01/06/2017   Chronic constipation 03/21/2016   Loss of weight    Vitamin D  deficiency    Osteoporosis    Hyperlipidemia    Paroxysmal atrial fibrillation (HCC) 03/03/2012   Pituitary disorder 02/11/2012   Ataxia 01/29/2012   Atrial fibrillation with RVR (HCC) 11/22/2011   Normochromic normocytic anemia 11/21/2011   Diastolic CHF, chronic (HCC) 11/13/2011   Pleural effusion, left 11/11/2011   S/P mitral valve repair 10/15/2011   Brain tumor, astrocytoma (HCC) 10/15/2011   Neuromuscular disorder (HCC) 10/15/2011   Neurogenic bladder 10/15/2011   Arthritis 10/15/2011   Mitral regurgitation due to cusp prolapse 07/08/2011    Orientation  RESPIRATION BLADDER Height & Weight     Self  Normal Incontinent Weight: 121 lb 4.1 oz (55 kg) Height:  4' 9 (144.8 cm)  BEHAVIORAL SYMPTOMS/MOOD NEUROLOGICAL BOWEL NUTRITION STATUS      Incontinent Diet (see DC summary)  AMBULATORY STATUS COMMUNICATION OF NEEDS Skin   Extensive Assist Verbally Normal                       Personal Care Assistance Level of Assistance  Bathing, Feeding, Dressing Bathing Assistance: Maximum assistance Feeding assistance: Maximum assistance Dressing Assistance: Maximum assistance     Functional Limitations Info             SPECIAL CARE FACTORS FREQUENCY                       Contractures Contractures Info: Not present    Additional Factors Info  Code Status, Allergies, Psychotropic Code Status Info: DNR Allergies Info: Compazine, Prochlorperazine, Prochlorperazine Edisylate, Prochlorperazine Maleate Psychotropic Info: Lexapro  10mg  daily, Klonopin  .5mg  2x/day and .25mg  at bed         Current Medications (09/07/2024):  This is the current hospital active medication list Current Facility-Administered Medications  Medication Dose Route Frequency Provider Last Rate Last Admin   acetaminophen  (TYLENOL ) tablet 650 mg  650 mg Oral Q6H PRN Opyd, Timothy S, MD       Or   acetaminophen  (TYLENOL ) suppository 650 mg  650 mg Rectal Q6H PRN Opyd, Timothy S, MD       albuterol  (PROVENTIL ) (2.5 MG/3ML) 0.083% nebulizer  solution 3 mL  3 mL Nebulization Q6H PRN Opyd, Timothy S, MD       [START ON 09/21/2024] amiodarone  (PACERONE ) tablet 100 mg  100 mg Oral Daily Adams, Zane, PA-C       [START ON 09/14/2024] amiodarone  (PACERONE ) tablet 200 mg  200 mg Oral BID Adams, Zane, PA-C       amiodarone  (PACERONE ) tablet 400 mg  400 mg Oral BID Adams, Zane, PA-C       apixaban  (ELIQUIS ) tablet 5 mg  5 mg Oral BID Jens Durand, MD   5 mg at 09/06/24 2125   artificial tears ophthalmic solution 1 drop  1 drop Both Eyes QID Charlton Evalene RAMAN, MD   1 drop at  09/07/24 9161   atorvastatin  (LIPITOR) tablet 20 mg  20 mg Oral Daily Opyd, Timothy S, MD   20 mg at 09/06/24 0847   cefTRIAXone  (ROCEPHIN ) 1 g in sodium chloride  0.9 % 100 mL IVPB  1 g Intravenous Q24H Opyd, Timothy S, MD 200 mL/hr at 09/06/24 2123 1 g at 09/06/24 2123   clonazepam  (KLONOPIN ) disintegrating tablet 0.25 mg  0.25 mg Oral QHS Opyd, Timothy S, MD   0.25 mg at 09/06/24 2125   clonazePAM  (KLONOPIN ) tablet 0.5 mg  0.5 mg Oral BID Opyd, Timothy S, MD   0.5 mg at 09/07/24 9162   digoxin  (LANOXIN ) tablet 0.0625 mg  0.0625 mg Oral Daily Opyd, Timothy S, MD   0.0625 mg at 09/06/24 9147   docusate sodium  (COLACE) capsule 100 mg  100 mg Oral Daily Opyd, Timothy S, MD   100 mg at 09/06/24 9153   escitalopram  (LEXAPRO ) tablet 10 mg  10 mg Oral Daily Opyd, Timothy S, MD   10 mg at 09/06/24 0847   feeding supplement (ENSURE PLUS HIGH PROTEIN) liquid 237 mL  237 mL Oral BID BM Von Bellis, MD   237 mL at 09/06/24 1553   melatonin tablet 3 mg  3 mg Oral QHS Opyd, Timothy S, MD   3 mg at 09/06/24 2125   [START ON 09/08/2024] metoprolol  succinate (TOPROL -XL) 24 hr tablet 12.5 mg  12.5 mg Oral Daily Von Bellis, MD       midodrine  (PROAMATINE ) tablet 10 mg  10 mg Oral TID WC Von Bellis, MD   10 mg at 09/07/24 9162   mirabegron  ER (MYRBETRIQ ) tablet 25 mg  25 mg Oral Daily Opyd, Timothy S, MD   25 mg at 09/06/24 0847   ondansetron  (ZOFRAN ) injection 4 mg  4 mg Intravenous Q6H PRN Opyd, Timothy S, MD       pantoprazole  (PROTONIX ) EC tablet 40 mg  40 mg Oral Daily Opyd, Timothy S, MD   40 mg at 09/06/24 0846   polyethylene glycol (MIRALAX  / GLYCOLAX ) packet 17 g  17 g Oral Daily Opyd, Timothy S, MD   17 g at 09/06/24 0846   senna-docusate (Senokot-S) tablet 1 tablet  1 tablet Oral QHS PRN Opyd, Timothy S, MD       sodium chloride  flush (NS) 0.9 % injection 3 mL  3 mL Intravenous Q12H Opyd, Timothy S, MD   3 mL at 09/06/24 2127   vitamin B-12 (CYANOCOBALAMIN ) tablet 500 mcg  500 mcg Oral Daily Von Bellis, MD   500 mcg at 09/06/24 9152     Discharge Medications: Please see discharge summary for a list of discharge medications.  Relevant Imaging Results:  Relevant Lab Results:   Additional Information SS#: [id number]  AULANI SHIPTON, LCSW     "

## 2024-09-08 DIAGNOSIS — I4891 Unspecified atrial fibrillation: Secondary | ICD-10-CM | POA: Diagnosis not present

## 2024-09-08 DIAGNOSIS — G9340 Encephalopathy, unspecified: Secondary | ICD-10-CM | POA: Diagnosis not present

## 2024-09-08 LAB — BASIC METABOLIC PANEL WITH GFR
Anion gap: 11 (ref 5–15)
BUN: 17 mg/dL (ref 8–23)
CO2: 24 mmol/L (ref 22–32)
Calcium: 9.3 mg/dL (ref 8.9–10.3)
Chloride: 103 mmol/L (ref 98–111)
Creatinine, Ser: 0.77 mg/dL (ref 0.44–1.00)
GFR, Estimated: >60 mL/min
Glucose, Bld: 147 mg/dL — ABNORMAL HIGH (ref 70–99)
Potassium: 4.1 mmol/L (ref 3.5–5.1)
Sodium: 138 mmol/L (ref 135–145)

## 2024-09-08 LAB — CBC
HCT: 32.9 % — ABNORMAL LOW (ref 36.0–46.0)
Hemoglobin: 10.7 g/dL — ABNORMAL LOW (ref 12.0–15.0)
MCH: 29.2 pg (ref 26.0–34.0)
MCHC: 32.5 g/dL (ref 30.0–36.0)
MCV: 89.9 fL (ref 80.0–100.0)
Platelets: 243 K/uL (ref 150–400)
RBC: 3.66 MIL/uL — ABNORMAL LOW (ref 3.87–5.11)
RDW: 14.8 % (ref 11.5–15.5)
WBC: 11 K/uL — ABNORMAL HIGH (ref 4.0–10.5)
nRBC: 0 % (ref 0.0–0.2)

## 2024-09-08 MED ORDER — SENNOSIDES-DOCUSATE SODIUM 8.6-50 MG PO TABS: 1.0000 | ORAL_TABLET | Freq: Every evening | ORAL | Status: AC | PRN

## 2024-09-08 MED ORDER — AMIODARONE HCL 200 MG PO TABS: 200.0000 mg | ORAL_TABLET | Freq: Two times a day (BID) | ORAL | 0 refills | Status: DC

## 2024-09-08 MED ORDER — AMIODARONE HCL 100 MG PO TABS: 100.0000 mg | ORAL_TABLET | Freq: Every day | ORAL | Status: DC

## 2024-09-08 MED ORDER — ENSURE PLUS HIGH PROTEIN PO LIQD: 237.0000 mL | Freq: Two times a day (BID) | ORAL | Status: AC

## 2024-09-08 MED ORDER — AMIODARONE HCL 400 MG PO TABS: 400.0000 mg | ORAL_TABLET | Freq: Two times a day (BID) | ORAL | Status: AC

## 2024-09-08 MED ORDER — MIDODRINE HCL 5 MG PO TABS: 5.0000 mg | ORAL_TABLET | Freq: Two times a day (BID) | ORAL | 0 refills | Status: DC

## 2024-09-08 MED ORDER — ACETAMINOPHEN 325 MG PO TABS: 650.0000 mg | ORAL_TABLET | Freq: Four times a day (QID) | ORAL | Status: AC | PRN

## 2024-09-08 MED ORDER — CYANOCOBALAMIN 500 MCG PO TABS: 500.0000 ug | ORAL_TABLET | Freq: Every day | ORAL | Status: AC

## 2024-09-08 MED ORDER — MIDODRINE HCL 5 MG PO TABS: 5.0000 mg | ORAL_TABLET | Freq: Two times a day (BID) | ORAL | Status: AC

## 2024-09-08 MED ORDER — METOPROLOL SUCCINATE ER 25 MG PO TB24: 12.5000 mg | ORAL_TABLET | Freq: Two times a day (BID) | ORAL | Status: AC

## 2024-09-08 MED ORDER — AMIODARONE HCL 200 MG PO TABS: 200.0000 mg | ORAL_TABLET | Freq: Two times a day (BID) | ORAL | Status: AC

## 2024-09-08 MED ORDER — AMIODARONE HCL 200 MG PO TABS: 200.0000 mg | ORAL_TABLET | Freq: Two times a day (BID) | ORAL | Status: DC

## 2024-09-08 MED ORDER — AMIODARONE HCL 100 MG PO TABS: 100.0000 mg | ORAL_TABLET | Freq: Every day | ORAL | Status: AC

## 2024-09-08 MED ORDER — METOPROLOL SUCCINATE ER 25 MG PO TB24
12.5000 mg | ORAL_TABLET | Freq: Two times a day (BID) | ORAL | Status: DC
  Administered 2024-09-08: 12.5 mg via ORAL
  Filled 2024-09-08: qty 1

## 2024-09-08 NOTE — Plan of Care (Signed)

## 2024-09-08 NOTE — Discharge Summary (Signed)
 " Physician Discharge Summary   Patient: Michaela [name] MRN: [id number] DOB: 11-05-1955  Admit date:     09/03/2024  Discharge date: 09/08/2024  Discharge Physician: Duffy Al-Sultani   PCP: [place], [facility]   Recommendations at discharge:   SNF MD to monitor HR, notify cardiology if HR persistently > 120 bpm.  Monitor BP, on midodrine  Please monitor digoxin  levels given the patient was started on amiodarone . Will need digoxin  level in 5 days. Adjust dose if needed, repeat levels PRN. Follow up with cardiology/EP as scheduled by cardiology team on 11/03/2024  Discharge Diagnoses: Principal Problem:   Acute encephalopathy Active Problems:   Atrial fibrillation with RVR (HCC)   Neurocognitive deficits   Obstructive hydrocephalus (HCC)   Acute cystitis  Resolved Problems:   * No resolved hospital problems. 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  Per EMS, heart rate was ranging 130 to 150 bpm for which she was given IV Cardizem  10 mg en route with improvement in heart rate to 70-100.  In the ED, the patient was found to be afebrile with SpO2 was in the mid 90s on RA, with normal RR and stable BP.  Labs were notable for normal creatinine, normal WBC, bacteriuria, pyuria, and urine nitrates.  On admission, her sister reported that she has had progressive decline in mental status over the last 2 years but has worsened over the last 2 days prior to presentation.  The patient was admitted for acute encephalopathy thought to be secondary to UTI and A-fib with RVR.  # UTI - patient with UA consistent with UTI with urine culture positive for  >100,000 colonies Klebsiella pneumonia (also with 20,000 colonies of Proteus mirabilis) completed therapy with 5 days of IV Rocephin  with no residual symptoms and improvement in mental status.  # Acute encephalopathy - likely secondary to UTI, has returned to mental baseline per her sister with treatment of UTI.  She is ANO x 3 although exhibits some slowness in response.   # Afib with RVR - patient presented in A-fib RVR.  She was initially managed with IV amiodarone .  Cardiology was consulted recommended starting p.o. amiodarone  taper with 400 mg BID for 1 week, followed by 200 mg twice daily for 1 week, followed by 100 mg daily.  At the time of discharge on 12/31, she is on day 2 of amiodarone  400 mg twice daily.  She will also remain on very low-dose digoxin  and Toprol -XL 12.5 mg twice daily.  She has a scheduled EP virtual visit on 11/03/2024.  She will need digoxin  levels checked at SNF given amiodarone  was started, likely in 5 days, adjust dose as needed and repeat levels as needed.  Per cardiology, anticipate some level of elevated heart rate and will likely be mildly elevated at SNF.  Cardiology recommended monitoring and contacting them if heart rate remains elevated and sustained over 120 bpm.  #Hypotension - patient had episodes of low BP and was started on midodrine , likely secondary to AV nodal blockade agents.  Discussed with cardiology, she will be discharged on 5 mg BID, once in the morning and then 6 hours later (around lunch). Continue close monitoring of blood pressure.   #  Hydrocephalus with VP shunt in place -  she was evaluated by neurosurgery who indicated no VP shunt malfunction that would contribute to her mental status change.   Other comorbidities managed with PTA home medications without changes.       Consultants: Cardiology, neurosurgery Procedures performed: None Disposition: Skilled nursing facility Diet recommendation:  Diet Orders (From admission, onward)      Start     Ordered   09/04/24 1031  DIET DYS 2 Room service appropriate? Yes; Fluid consistency: Thin  Diet effective now       Question Answer Comment  Room service appropriate? Yes   Fluid consistency: Thin      09/04/24 1030            DISCHARGE MEDICATION: Allergies as of 09/08/2024       Reactions   Compazine Other (See Comments)   eyes get buggy   Prochlorperazine    Prochlorperazine Edisylate    Prochlorperazine Maleate         Medication List     STOP taking these medications    metoprolol  tartrate 25 MG tablet Commonly known as: LOPRESSOR        TAKE these medications    acetaminophen  325 MG tablet Commonly known as: TYLENOL  Take 2 tablets (650 mg total) by mouth every 6 (six) hours as needed for mild pain (pain score 1-3) or fever (or Fever >/= 101).   albuterol  1.25 MG/3ML nebulizer solution Commonly known as: ACCUNEB  Take 1 ampule by nebulization every 6 (six) hours as needed for wheezing.   amiodarone  400 MG tablet Commonly known as: PACERONE  Take 1 tablet (400 mg total) by mouth 2 (two) times daily.   amiodarone  200 MG tablet Commonly known as: PACERONE  Take 1 tablet (200 mg total) by mouth 2 (two) times daily. Start taking on: September 14, 2024   amiodarone  100 MG tablet Commonly known as: PACERONE  Take 1 tablet (100 mg total) by mouth daily. Start taking on: September 21, 2024   apixaban  5 MG Tabs tablet Commonly known as: ELIQUIS  Take 1 tablet (5 mg total) by mouth 2 (two) times daily.   artificial tears ophthalmic solution Place 1 drop into both eyes 4 (four) times daily.   atorvastatin  20 MG tablet Commonly known as: LIPITOR Take 20 mg by mouth daily.   Biofreeze 4 % Gel Generic drug: Menthol (Topical Analgesic) Apply 1 application  topically 2 (two) times daily as needed. Apply topically BID to sore areas - back   Calmoseptine 0.44-20.6 % Oint Generic drug: Menthol-Zinc Oxide Apply 1 Application topically 2 (two) times  daily. Apply to buttocks   cholecalciferol 25 MCG (1000 UNIT) tablet Commonly known as: VITAMIN D3 Take 1,000 Units by mouth daily.   clonazePAM  0.5 MG tablet Commonly known as: KLONOPIN  Take 0.25-0.5 mg by mouth in the morning, at noon, and at bedtime. Give 0.5 mg BID & Give 0.25 mg at bedtime. Hold if sedated   cyanocobalamin  500 MCG tablet Commonly known as: VITAMIN B12 Take 1 tablet (500 mcg total) by mouth daily. Start taking on: September 09, 2024   Digoxin  62.5 MCG Tabs Take 0.0625 mg by mouth daily.   docusate sodium  100 MG capsule Commonly known as: COLACE Take 100 mg by mouth daily.   escitalopram  10 MG tablet Commonly known as: LEXAPRO  Take 10 mg by mouth daily.   feeding supplement Liqd Take 237 mLs by mouth 2 (two) times daily between meals.   levalbuterol  0.63 MG/3ML nebulizer solution Commonly known as:  XOPENEX  Take 3 mLs (0.63 mg total) by nebulization every 6 (six) hours as needed for wheezing or shortness of breath.   loratadine 10 MG tablet Commonly known as: CLARITIN Take 10 mg by mouth daily.   melatonin 3 MG Tabs tablet Take 3 mg by mouth at bedtime.   metoprolol  succinate 25 MG 24 hr tablet Commonly known as: TOPROL -XL Take 0.5 tablets (12.5 mg total) by mouth 2 (two) times daily. Hold for HR < 60 and/or SBP < 100 mmHg and/or MAP < 65. Notify MD if held.   Myrbetriq  25 MG Tb24 tablet Generic drug: mirabegron  ER Take 25 mg by mouth daily.   omeprazole 20 MG capsule Commonly known as: PRILOSEC Take 20 mg by mouth daily.   OXYGEN Inhale 2 L into the lungs daily as needed (if needed for shortness of breath).   polyethylene glycol 17 g packet Commonly known as: MIRALAX  / GLYCOLAX  Take 17 g by mouth daily.   senna-docusate 8.6-50 MG tablet Commonly known as: Senokot-S Take 1 tablet by mouth at bedtime as needed for mild constipation.        Contact information for after-discharge care     Destination     [facility]   Service:  Skilled Nursing Contact information: [address] [place]  [address] [phone]                     Discharge Exam: Fredricka Weights   09/05/24 0500 09/06/24 1419 09/08/24 0416  Weight: 55 kg 55 kg 53.8 kg   Blood pressure 110/61, pulse (!) 111, temperature 97.6 F (36.4 C), temperature source Oral, resp. rate 15, height 4' 9 (1.448 m), weight 53.8 kg, SpO2 93%.   Gen: NAD, A&Ox3, pleasant, slow to respond HEENT: NCAT, EOMI Neck: Supple CV: Irregular rhythm, tachycardic, no murmurs Resp: normal WOB, CTAB, no w/r/r Abd: Soft, NTND, no guarding Ext: No LE edema Skin: Warm, dry, no rashes/lesions Neuro: No focal deficits    Condition at discharge: good  The results of significant diagnostics from this hospitalization (including imaging, microbiology, ancillary and laboratory) are listed below for reference.   Imaging Studies: ECHOCARDIOGRAM COMPLETE Result Date: 09/07/2024    ECHOCARDIOGRAM REPORT   Patient Name:   TASHAWN GREFF Bos Date of Exam: 09/07/2024 Medical Rec #:  [id number]           Height:       57.0 in Accession #:    [id number]          Weight:       121.3 lb Date of Birth:  1955/09/18           BSA:          1.454 m Patient Age:    68 years            BP:           112/88 mmHg Patient Gender: F                   HR:           118 bpm. Exam Location:  Inpatient Procedure: 2D Echo, Cardiac Doppler and Color Doppler (Both Spectral and Color            Flow Doppler were utilized during procedure). Indications:    Atrial FIbrillation I48.91  History:        Patient has prior history of Echocardiogram examinations, most  recent 09/06/2022. Mitral Valve Disease; Risk                 Factors:Dyslipidemia.                  Mitral Valve: 36 MM Carbomedics mitral ring valve is present in                 the mitral position. Procedure Date: 2013.  Sonographer:    Tinnie Gosling RDCS Referring Phys: [id number] ZANE ADAMS IMPRESSIONS  1.  Left ventricular ejection fraction, by estimation, is 55 to 60%. The left ventricle has normal function. The left ventricle has no regional wall motion abnormalities. There is mild concentric left ventricular hypertrophy. Left ventricular diastolic parameters are indeterminate.  2. Right ventricular systolic function is mildly reduced. The right ventricular size is normal. There is normal pulmonary artery systolic pressure. The estimated right ventricular systolic pressure is 27.4 mmHg.  3. Left atrial size was moderately dilated.  4. S/p mitral valve repair with Alfieri stitch and annuloplasty ring. Trivial mitral regurgitation noted. Mean gradient 9 mmHg across the valve (prior gradient reported as 7.5 mmHg). Possible moderate stenosis but not markedly different than in the past.  5. The aortic valve is tricuspid. Aortic valve regurgitation is not visualized. No aortic stenosis is present.  6. The inferior vena cava is normal in size with <50% respiratory variability, suggesting right atrial pressure of 8 mmHg. FINDINGS  Left Ventricle: Left ventricular ejection fraction, by estimation, is 55 to 60%. The left ventricle has normal function. The left ventricle has no regional wall motion abnormalities. The left ventricular internal cavity size was normal in size. There is  mild concentric left ventricular hypertrophy. Left ventricular diastolic parameters are indeterminate. Right Ventricle: The right ventricular size is normal. No increase in right ventricular wall thickness. Right ventricular systolic function is mildly reduced. There is normal pulmonary artery systolic pressure. The tricuspid regurgitant velocity is 2.20 m/s, and with an assumed right atrial pressure of 8 mmHg, the estimated right ventricular systolic pressure is 27.4 mmHg. Left Atrium: Left atrial size was moderately dilated. Right Atrium: Right atrial size was normal in size. Pericardium: There is no evidence of pericardial effusion. Mitral  Valve: S/p mitral valve repair with Alfieri stitch and annuloplasty ring. Trivial mitral regurgitation noted. Mean gradient 9 mmHg across the valve (prior gradient reported as 7.5). Possible moderate stenosis but not markedly different than in the  past. There is a 36 MM Carbomedics mitral ring present in the mitral position. Procedure Date: 2013. Tricuspid Valve: The tricuspid valve is normal in structure. Tricuspid valve regurgitation is trivial. Aortic Valve: The aortic valve is tricuspid. Aortic valve regurgitation is not visualized. No aortic stenosis is present. Pulmonic Valve: The pulmonic valve was normal in structure. Pulmonic valve regurgitation is trivial. Aorta: The aortic root is normal in size and structure. Venous: The inferior vena cava is normal in size with less than 50% respiratory variability, suggesting right atrial pressure of 8 mmHg. IAS/Shunts: No atrial level shunt detected by color flow Doppler.  LEFT VENTRICLE PLAX 2D LVIDd:         4.60 cm LVIDs:         3.30 cm LV PW:         1.20 cm LV IVS:        1.20 cm LVOT diam:     1.80 cm LV SV:         26 LV SV Index:   18 LVOT  Area:     2.54 cm  RIGHT VENTRICLE            IVC RV S prime:     7.95 cm/s  IVC diam: 1.60 cm LEFT ATRIUM           Index        RIGHT ATRIUM           Index LA diam:      4.30 cm 2.96 cm/m   RA Area:     12.00 cm LA Vol (A4C): 40.0 ml 27.51 ml/m  RA Volume:   28.30 ml  19.46 ml/m  AORTIC VALVE LVOT Vmax:   76.00 cm/s LVOT Vmean:  44.000 cm/s LVOT VTI:    0.104 m  AORTA Ao Root diam: 2.70 cm Ao Asc diam:  2.70 cm TRICUSPID VALVE TR Peak grad:   19.4 mmHg TR Vmax:        220.00 cm/s  SHUNTS Systemic VTI:  0.10 m Systemic Diam: 1.80 cm Dalton McleanMD Electronically signed by Ezra Kanner Signature Date/Time: 09/07/2024/2:02:45 PM    Final    DG Abd 1 View Result Date: 09/03/2024 EXAM: 1 VIEW XRAY OF THE ABDOMEN 09/03/2024 07:18:00 PM COMPARISON: Cervical spine x-ray and chest x-ray 09/03/2024 CLINICAL HISTORY:  evaluate for shunt malfunction FINDINGS: LINES, TUBES AND DEVICES: Both VP shunts course below in the esophagus with the tip overlying the abdomen. BOWEL: Nonobstructive bowel gas pattern. SOFT TISSUES: 2 metallic clips overlying the right upper quadrant. Mitral annular calcification status post repair. BONES: No acute fracture. LUNGS/PLEURAL SPACES: Patchy airspace opacity of the left lower lung zone with likely trace left pleural effusion. IMPRESSION: 1. Ventriculoperitoneal shunt catheters project below the diaphragm with distal tips overlying the abdomen. 2. Nonobstructive bowel gas pattern. 3. Patchy left lower lung airspace opacity with likely trace left pleural effusion. Electronically signed by: Morgane Naveau MD 09/03/2024 09:31 PM EST RP Workstation: [id number]   DG Skull 1-3 Views Result Date: 09/03/2024 EXAM: 3 VIEW(S) XRAY OF THE SKULL 09/03/2024 07:18:00 PM COMPARISON: None available. CLINICAL HISTORY: evaluate for shunt malfunction FINDINGS: BONES: No acute fracture. SINUSES: Unremarkable. SOFT TISSUES: Two right ventriculoperitoneal (VP) shunts are present. One shunt, with a frontal approach, correlates with the medial most shunt noted on the cervical spine x-ray. The second shunt, likely an abandoned posterior approach right VP shunt, correlates with the lateral most noted shunt on cervical spine x-ray and is kinked and calcified in the neck. IMPRESSION: 1. Two right VP shunts with a frontal approach shunt that correlates with the medial most shunt noted on the cervical spine x-ray. 2. Likely abandoned posterior approach right VP shunt that correlates with the lateral most noted shunt on cervical spine x-ray that is kinked and calcified in the neck. Electronically signed by: Morgane Naveau MD 09/03/2024 09:29 PM EST RP Workstation: [id number]   DG Cervical Spine 1 View Result Date: 09/03/2024 EXAM: 1 VIEW(S) XRAY OF THE CERVICAL SPINE 09/03/2024 07:18:00 PM COMPARISON: None available.  CLINICAL HISTORY: AMS (altered mental status). FINDINGS: BONES: Vertebral body heights are maintained. Alignment is normal. DISCS AND DEGENERATIVE CHANGES: No severe degenerative changes. SOFT TISSUES: No prevertebral soft tissue swelling. Two VP shunts are noted. A more lateral right neck shunt demonstrates possible kinking and calcification along the course of the tube. The more medial tube overlying the neck and chest appears continuous with no kinking. The visualized lungs appear clear. IMPRESSION: 1. Two VP shunts are noted. A more lateral right neck shunt demonstrates possible kinking and  calcification along the course of the tube. The more medial tube overlying the neck and chest appears continuous with no kinking. Electronically signed by: Morgane Naveau MD 09/03/2024 09:25 PM EST RP Workstation: [id number]   CT Head Wo Contrast Result Date: 09/03/2024 EXAM: CT HEAD WITHOUT CONTRAST 09/03/2024 07:54:45 PM TECHNIQUE: CT of the head was performed without the administration of intravenous contrast. Automated exposure control, iterative reconstruction, and/or weight based adjustment of the mA/kV was utilized to reduce the radiation dose to as low as reasonably achievable. COMPARISON: CT Head dated 09/05/2022 and MRI dated 08/27/2018. CLINICAL HISTORY: Mental status change, unknown cause. FINDINGS: BRAIN AND VENTRICLES: Right frontal and right parietal approach ventriculostomy catheters in place. Similar postoperative changes of the posterior fossa with areas of dystrophic calcifications along the margin of the dilated 4th ventricle. Similar appearance of atrophy and encephalomalacia throughout the cerebellum. Interval increase in size of the lateral ventricles compared to the 2023 CT. The third ventricle is also increased in caliber compared to prior. Balloond and intrapped appearance of the fourth ventricle is similar to prior. No midline shift. Similar appearance of generalized cerebral volume loss. Left  parieto-occipital chronic infarct. Similar encephalomalacia in the right frontal lobe along the ventriculostomy catheter tract. Masslike soft tissue along the dorsal aspect of the clivus which is slightly increased in size compared to the 2023 CT and is significantly increased in size compared to the MRI from 08/27/2018. The soft tissue now measures up to 1.8 x 1.0 cm best seen on sagittal series 6 image 30. There is indentation of the ventral pons. Recommend contrast enhanced MRI of the brain for further evaluation. No acute hemorrhage. No evidence of acute infarct. No extra-axial collection. ORBITS: No acute abnormality. SINUSES: No acute abnormality. SOFT TISSUES AND SKULL: Postsurgical changes of suboccipital craniectomy. No acute soft tissue abnormality. No skull fracture. IMPRESSION: 1. Interval increase in size of the lateral and third ventricles compared to the 2023 CT. Similar post surgical changes of the posterior fossa and entrapped appearance of the fourth ventricle. 2. Masslike soft tissue along the dorsal aspect of the clivus is slightly increased in size compared to the 2023 CT and significantly increased in size compared to the MRI from 08/27/2018, now measuring up to 1.8 x 1.0 cm with indentation of the ventral pons. Recommend contrast enhanced MRI of the brain for further evaluation. Electronically signed by: Donnice Mania MD 09/03/2024 08:51 PM EST RP Workstation: [id number]   DG Chest Portable 1 View Result Date: 09/03/2024 EXAM: 1 VIEW(S) XRAY OF THE CHEST 09/03/2024 06:08:00 PM COMPARISON: None available. CLINICAL HISTORY: SOB FINDINGS: LINES, TUBES AND DEVICES: VP shunt tubing traverses right chest wall. LUNGS AND PLEURA: Mildly low lung volumes. There are some interstitial opacities in both lower lungs which may represent atelectasis. Small left pleural effusion. No pneumothorax. HEART AND MEDIASTINUM: Upper normal heart size. Mitral annular calcification. Aortic atherosclerosis  (ICD10-I70.0). BONES AND SOFT TISSUES: No acute osseous abnormality. IMPRESSION: 1. Small left pleural effusion. 2. Mild bibasilar interstitial opacities, favored atelectasis. Electronically signed by: Greig Pique MD 09/03/2024 07:26 PM EST RP Workstation: [id number]    Microbiology: Results for orders placed or performed during the hospital encounter of 09/03/24  Resp panel by RT-PCR (RSV, Flu A&B, Covid) Anterior Nasal Swab     Status: None   Collection Time: 09/03/24  6:28 PM   Specimen: Anterior Nasal Swab  Result Value Ref Range Status   SARS Coronavirus 2 by RT PCR NEGATIVE NEGATIVE Final   Influenza A by PCR  NEGATIVE NEGATIVE Final   Influenza B by PCR NEGATIVE NEGATIVE Final    Comment: (NOTE) The Xpert Xpress SARS-CoV-2/FLU/RSV plus assay is intended as an aid in the diagnosis of influenza from Nasopharyngeal swab specimens and should not be used as a sole basis for treatment. Nasal washings and aspirates are unacceptable for Xpert Xpress SARS-CoV-2/FLU/RSV testing.  Fact Sheet for Patients: bloggercourse.com  Fact Sheet for Healthcare Providers: seriousbroker.it  This test is not yet approved or cleared by the [place]  FDA and has been authorized for detection and/or diagnosis of SARS-CoV-2 by FDA under an Emergency Use Authorization (EUA). This EUA will remain in effect (meaning this test can be used) for the duration of the COVID-19 declaration under Section 564(b)(1) of the Act, 21 U.S.C. section 360bbb-3(b)(1), unless the authorization is terminated or revoked.     Resp Syncytial Virus by PCR NEGATIVE NEGATIVE Final    Comment: (NOTE) Fact Sheet for Patients: bloggercourse.com  Fact Sheet for Healthcare Providers: seriousbroker.it  This test is not yet approved or cleared by the [place]  FDA and has been authorized for detection and/or diagnosis of  SARS-CoV-2 by FDA under an Emergency Use Authorization (EUA). This EUA will remain in effect (meaning this test can be used) for the duration of the COVID-19 declaration under Section 564(b)(1) of the Act, 21 U.S.C. section 360bbb-3(b)(1), unless the authorization is terminated or revoked.  Performed at [facility], [address] [place], [place] [address]   Urine Culture     Status: Abnormal   Collection Time: 09/03/24  9:05 PM   Specimen: Urine, Clean Catch  Result Value Ref Range Status   Specimen Description URINE, CLEAN CATCH  Final   Special Requests   Final    NONE Performed at [facility], [address] [place], [place] [address]    Culture (A)  Final    >=100,000 COLONIES/mL KLEBSIELLA PNEUMONIAE 20,000 COLONIES/mL PROTEUS MIRABILIS    Report Status 09/07/2024 FINAL  Final   Organism ID, Bacteria KLEBSIELLA PNEUMONIAE (A)  Final   Organism ID, Bacteria PROTEUS MIRABILIS (A)  Final      Susceptibility   Klebsiella pneumoniae - MIC*    AMPICILLIN  >=32 RESISTANT Resistant     CEFAZOLIN (URINE) Value in next row Sensitive      2 SENSITIVEThis is a modified FDA-approved test that has been validated and its performance characteristics determined by the reporting laboratory.  This laboratory is certified under the Clinical Laboratory Improvement Amendments CLIA as qualified to perform high complexity clinical laboratory testing.    CEFEPIME  Value in next row Sensitive      2 SENSITIVEThis is a modified FDA-approved test that has been validated and its performance characteristics determined by the reporting laboratory.  This laboratory is certified under the Clinical Laboratory Improvement Amendments CLIA as qualified to perform high complexity clinical laboratory testing.    ERTAPENEM Value in next row Sensitive      2 SENSITIVEThis is a modified FDA-approved test that has been validated and its performance characteristics determined by the reporting laboratory.   This laboratory is certified under the Clinical Laboratory Improvement Amendments CLIA as qualified to perform high complexity clinical laboratory testing.    CEFTRIAXONE  Value in next row Sensitive      2 SENSITIVEThis is a modified FDA-approved test that has been validated and its performance characteristics determined by the reporting laboratory.  This laboratory is certified under the Clinical Laboratory Improvement Amendments CLIA as qualified to perform high  complexity clinical laboratory testing.    CIPROFLOXACIN  Value in next row Sensitive      2 SENSITIVEThis is a modified FDA-approved test that has been validated and its performance characteristics determined by the reporting laboratory.  This laboratory is certified under the Clinical Laboratory Improvement Amendments CLIA as qualified to perform high complexity clinical laboratory testing.    GENTAMICIN Value in next row Sensitive      2 SENSITIVEThis is a modified FDA-approved test that has been validated and its performance characteristics determined by the reporting laboratory.  This laboratory is certified under the Clinical Laboratory Improvement Amendments CLIA as qualified to perform high complexity clinical laboratory testing.    NITROFURANTOIN  Value in next row Intermediate      2 SENSITIVEThis is a modified FDA-approved test that has been validated and its performance characteristics determined by the reporting laboratory.  This laboratory is certified under the Clinical Laboratory Improvement Amendments CLIA as qualified to perform high complexity clinical laboratory testing.    TRIMETH/SULFA Value in next row Sensitive      2 SENSITIVEThis is a modified FDA-approved test that has been validated and its performance characteristics determined by the reporting laboratory.  This laboratory is certified under the Clinical Laboratory Improvement Amendments CLIA as qualified to perform high complexity clinical laboratory testing.     AMPICILLIN /SULBACTAM Value in next row Sensitive      2 SENSITIVEThis is a modified FDA-approved test that has been validated and its performance characteristics determined by the reporting laboratory.  This laboratory is certified under the Clinical Laboratory Improvement Amendments CLIA as qualified to perform high complexity clinical laboratory testing.    PIP/TAZO Value in next row Sensitive      <=4 SENSITIVEThis is a modified FDA-approved test that has been validated and its performance characteristics determined by the reporting laboratory.  This laboratory is certified under the Clinical Laboratory Improvement Amendments CLIA as qualified to perform high complexity clinical laboratory testing.    MEROPENEM Value in next row Sensitive      <=4 SENSITIVEThis is a modified FDA-approved test that has been validated and its performance characteristics determined by the reporting laboratory.  This laboratory is certified under the Clinical Laboratory Improvement Amendments CLIA as qualified to perform high complexity clinical laboratory testing.    * >=100,000 COLONIES/mL KLEBSIELLA PNEUMONIAE   Proteus mirabilis - MIC*    AMPICILLIN  Value in next row Resistant      <=4 SENSITIVEThis is a modified FDA-approved test that has been validated and its performance characteristics determined by the reporting laboratory.  This laboratory is certified under the Clinical Laboratory Improvement Amendments CLIA as qualified to perform high complexity clinical laboratory testing.    CEFAZOLIN (URINE) Value in next row Sensitive      4 SENSITIVEThis is a modified FDA-approved test that has been validated and its performance characteristics determined by the reporting laboratory.  This laboratory is certified under the Clinical Laboratory Improvement Amendments CLIA as qualified to perform high complexity clinical laboratory testing.    CEFEPIME  Value in next row Sensitive      4 SENSITIVEThis is a modified  FDA-approved test that has been validated and its performance characteristics determined by the reporting laboratory.  This laboratory is certified under the Clinical Laboratory Improvement Amendments CLIA as qualified to perform high complexity clinical laboratory testing.    ERTAPENEM Value in next row Sensitive      4 SENSITIVEThis is a modified FDA-approved test that has been validated and its performance characteristics  determined by the reporting laboratory.  This laboratory is certified under the Clinical Laboratory Improvement Amendments CLIA as qualified to perform high complexity clinical laboratory testing.    CEFTRIAXONE  Value in next row Sensitive      4 SENSITIVEThis is a modified FDA-approved test that has been validated and its performance characteristics determined by the reporting laboratory.  This laboratory is certified under the Clinical Laboratory Improvement Amendments CLIA as qualified to perform high complexity clinical laboratory testing.    CIPROFLOXACIN  Value in next row Resistant      4 SENSITIVEThis is a modified FDA-approved test that has been validated and its performance characteristics determined by the reporting laboratory.  This laboratory is certified under the Clinical Laboratory Improvement Amendments CLIA as qualified to perform high complexity clinical laboratory testing.    GENTAMICIN Value in next row Sensitive      4 SENSITIVEThis is a modified FDA-approved test that has been validated and its performance characteristics determined by the reporting laboratory.  This laboratory is certified under the Clinical Laboratory Improvement Amendments CLIA as qualified to perform high complexity clinical laboratory testing.    NITROFURANTOIN  Value in next row Resistant      4 SENSITIVEThis is a modified FDA-approved test that has been validated and its performance characteristics determined by the reporting laboratory.  This laboratory is certified under the Clinical  Laboratory Improvement Amendments CLIA as qualified to perform high complexity clinical laboratory testing.    TRIMETH/SULFA Value in next row Resistant      4 SENSITIVEThis is a modified FDA-approved test that has been validated and its performance characteristics determined by the reporting laboratory.  This laboratory is certified under the Clinical Laboratory Improvement Amendments CLIA as qualified to perform high complexity clinical laboratory testing.    AMPICILLIN /SULBACTAM Value in next row Sensitive      4 SENSITIVEThis is a modified FDA-approved test that has been validated and its performance characteristics determined by the reporting laboratory.  This laboratory is certified under the Clinical Laboratory Improvement Amendments CLIA as qualified to perform high complexity clinical laboratory testing.    PIP/TAZO Value in next row Sensitive      <=4 SENSITIVEThis is a modified FDA-approved test that has been validated and its performance characteristics determined by the reporting laboratory.  This laboratory is certified under the Clinical Laboratory Improvement Amendments CLIA as qualified to perform high complexity clinical laboratory testing.    MEROPENEM Value in next row Sensitive      <=4 SENSITIVEThis is a modified FDA-approved test that has been validated and its performance characteristics determined by the reporting laboratory.  This laboratory is certified under the Clinical Laboratory Improvement Amendments CLIA as qualified to perform high complexity clinical laboratory testing.    * 20,000 COLONIES/mL PROTEUS MIRABILIS    Labs: CBC: Recent Labs  Lab 09/03/24 1759 09/04/24 0415 09/05/24 0632 09/06/24 0249 09/07/24 0152  WBC 8.8 11.1* 9.7 10.7* 11.2*  NEUTROABS 5.7  --   --   --   --   HGB 11.0* 10.9* 10.6* 9.9* 10.5*  HCT 36.1 34.6* 33.1* 31.8* 33.5*  MCV 92.8 91.3 90.4 91.4 89.8  PLT 232 222 207 216 231   Basic Metabolic Panel: Recent Labs  Lab 09/03/24 1759  09/04/24 0415 09/05/24 0632 09/05/24 0959 09/06/24 0249 09/07/24 0152  NA 140 142 139  --  139 137  K 4.3 3.5 4.1  --  4.0 4.3  CL 107 103 105  --  104 103  CO2 25 27  25  --  26 24  GLUCOSE 89 102* 103*  --  107* 100*  BUN 23 18 22   --  17 19  CREATININE 0.73 0.74 0.69  --  0.84 0.80  CALCIUM  8.8* 9.2 8.8*  --  8.7* 9.1  MG 1.8  --   --  1.8 2.6*  --   PHOS  --   --   --  2.8 3.4  --    Liver Function Tests: Recent Labs  Lab 09/03/24 1759  AST 15  ALT 9  ALKPHOS 125  BILITOT 0.6  PROT 6.4*  ALBUMIN  3.7   CBG: Recent Labs  Lab 09/03/24 1750  GLUCAP 93    Discharge time spent: Time Coordinating Discharge: I spent a total of 35 minutes engaged in face-to-face discussion with the patient and/or caregivers regarding the patients care, assessment, plan, and discharge disposition. Over 50% of this time was dedicated to counseling the patient on the risks and benefits of treatment options and the discharge plan, as well as coordinating post-discharge care.   Signed: Chyane Greer Al-Sultani, MD [facility] 09/08/2024         "

## 2024-09-08 NOTE — Progress Notes (Signed)
 Report given to [employer], CHARITY [facility]DRAISER at [facility]. All questions answered.

## 2024-09-08 NOTE — Progress Notes (Signed)
 "   Progress Note  Patient Name: Michaela [name] Date of Encounter: 09/08/2024  Primary Cardiologist:   None   Subjective   She has no complaints this morning.  Denies palpitations or SOB.  No pain.   Inpatient Medications    Scheduled Meds:  [START ON 09/21/2024] amiodarone   100 mg Oral Daily   [START ON 09/14/2024] amiodarone   200 mg Oral BID   amiodarone   400 mg Oral BID   apixaban   5 mg Oral BID   artificial tears  1 drop Both Eyes QID   atorvastatin   20 mg Oral Daily   clonazepam   0.25 mg Oral QHS   clonazePAM   0.5 mg Oral BID   digoxin   0.0625 mg Oral Daily   docusate sodium   100 mg Oral Daily   escitalopram   10 mg Oral Daily   feeding supplement  237 mL Oral BID BM   melatonin  3 mg Oral QHS   metoprolol  succinate  12.5 mg Oral Daily   midodrine   10 mg Oral TID WC   mirabegron  ER  25 mg Oral Daily   pantoprazole   40 mg Oral Daily   polyethylene glycol  17 g Oral Daily   sodium chloride  flush  3 mL Intravenous Q12H   vitamin B-12  500 mcg Oral Daily   Continuous Infusions:  cefTRIAXone  (ROCEPHIN )  IV Stopped (09/07/24 2200)   PRN Meds: acetaminophen  **OR** acetaminophen , albuterol , ondansetron  (ZOFRAN ) IV, senna-docusate   Vital Signs    Vitals:   09/07/24 2020 09/08/24 0016 09/08/24 0411 09/08/24 0416  BP: 92/69 129/81 119/78   Pulse: (!) 121 (!) 119 (!) 108   Resp: 17 16 16    Temp: 98.2 F (36.8 C) 98.3 F (36.8 C) 98.1 F (36.7 C)   TempSrc: Oral Oral Oral   SpO2: 95% 95% 94%   Weight:    53.8 kg  Height:        Intake/Output Summary (Last 24 hours) at 09/08/2024 0829 Last data filed at 09/08/2024 0545 Gross per 24 hour  Intake 403 ml  Output 750 ml  Net -347 ml   Filed Weights   09/05/24 0500 09/06/24 1419 09/08/24 0416  Weight: 55 kg 55 kg 53.8 kg    Telemetry    Atrial fib.  Ventricular rate mildly increased.  - Personally Reviewed  ECG    NA - Personally Reviewed  Physical Exam   GEN: No  acute distress.   Neck: No   JVD Cardiac: Irregular RR, no murmurs, rubs, or gallops.  Respiratory: Clear   to auscultation bilaterally. GI: Soft, nontender, non-distended, normal bowel sounds  MS:  No edema; No deformity.   Labs    Chemistry Recent Labs  Lab 09/03/24 1759 09/04/24 0415 09/05/24 9367 09/06/24 0249 09/07/24 0152  NA 140   < > 139 139 137  K 4.3   < > 4.1 4.0 4.3  CL 107   < > 105 104 103  CO2 25   < > 25 26 24   GLUCOSE 89   < > 103* 107* 100*  BUN 23   < > 22 17 19   CREATININE 0.73   < > 0.69 0.84 0.80  CALCIUM  8.8*   < > 8.8* 8.7* 9.1  PROT 6.4*  --   --   --   --   ALBUMIN  3.7  --   --   --   --   AST 15  --   --   --   --  ALT 9  --   --   --   --   ALKPHOS 125  --   --   --   --   BILITOT 0.6  --   --   --   --   GFRNONAA >60   < > >60 >60 >60  ANIONGAP 9   < > 9 10 10    < > = values in this interval not displayed.     Hematology Recent Labs  Lab 09/05/24 9367 09/06/24 0249 09/07/24 0152  WBC 9.7 10.7* 11.2*  RBC 3.66* 3.48* 3.73*  HGB 10.6* 9.9* 10.5*  HCT 33.1* 31.8* 33.5*  MCV 90.4 91.4 89.8  MCH 29.0 28.4 28.2  MCHC 32.0 31.1 31.3  RDW 14.6 14.6 14.7  PLT 207 216 231    Cardiac EnzymesNo results for input(s): TROPONINI in the last 168 hours. No results for input(s): TROPIPOC in the last 168 hours.   BNP Recent Labs  Lab 09/03/24 1759  PROBNP 4,978.0*     DDimer No results for input(s): DDIMER in the last 168 hours.   Radiology    ECHOCARDIOGRAM COMPLETE Result Date: 09/07/2024    ECHOCARDIOGRAM REPORT   Patient Name:   ANDREIA GANDOLFI Fresquez Date of Exam: 09/07/2024 Medical Rec #:  [id number]           Height:       57.0 in Accession #:    [id number]          Weight:       121.3 lb Date of Birth:  July 30, 1956           BSA:          1.454 m Patient Age:    68 years            BP:           112/88 mmHg Patient Gender: F                   HR:           118 bpm. Exam Location:  Inpatient Procedure: 2D Echo, Cardiac Doppler and Color Doppler (Both Spectral  and Color            Flow Doppler were utilized during procedure). Indications:    Atrial FIbrillation I48.91  History:        Patient has prior history of Echocardiogram examinations, most                 recent 09/06/2022. Mitral Valve Disease; Risk                 Factors:Dyslipidemia.                  Mitral Valve: 36 MM Carbomedics mitral ring valve is present in                 the mitral position. Procedure Date: 2013.  Sonographer:    Tinnie Gosling RDCS Referring Phys: [id number] ZANE ADAMS IMPRESSIONS  1. Left ventricular ejection fraction, by estimation, is 55 to 60%. The left ventricle has normal function. The left ventricle has no regional wall motion abnormalities. There is mild concentric left ventricular hypertrophy. Left ventricular diastolic parameters are indeterminate.  2. Right ventricular systolic function is mildly reduced. The right ventricular size is normal. There is normal pulmonary artery systolic pressure. The estimated right ventricular systolic pressure is 27.4 mmHg.  3. Left atrial size was moderately dilated.  4. S/p mitral valve  repair with Alfieri stitch and annuloplasty ring. Trivial mitral regurgitation noted. Mean gradient 9 mmHg across the valve (prior gradient reported as 7.5 mmHg). Possible moderate stenosis but not markedly different than in the past.  5. The aortic valve is tricuspid. Aortic valve regurgitation is not visualized. No aortic stenosis is present.  6. The inferior vena cava is normal in size with <50% respiratory variability, suggesting right atrial pressure of 8 mmHg. FINDINGS  Left Ventricle: Left ventricular ejection fraction, by estimation, is 55 to 60%. The left ventricle has normal function. The left ventricle has no regional wall motion abnormalities. The left ventricular internal cavity size was normal in size. There is  mild concentric left ventricular hypertrophy. Left ventricular diastolic parameters are indeterminate. Right Ventricle: The right  ventricular size is normal. No increase in right ventricular wall thickness. Right ventricular systolic function is mildly reduced. There is normal pulmonary artery systolic pressure. The tricuspid regurgitant velocity is 2.20 m/s, and with an assumed right atrial pressure of 8 mmHg, the estimated right ventricular systolic pressure is 27.4 mmHg. Left Atrium: Left atrial size was moderately dilated. Right Atrium: Right atrial size was normal in size. Pericardium: There is no evidence of pericardial effusion. Mitral Valve: S/p mitral valve repair with Alfieri stitch and annuloplasty ring. Trivial mitral regurgitation noted. Mean gradient 9 mmHg across the valve (prior gradient reported as 7.5). Possible moderate stenosis but not markedly different than in the  past. There is a 36 MM Carbomedics mitral ring present in the mitral position. Procedure Date: 2013. Tricuspid Valve: The tricuspid valve is normal in structure. Tricuspid valve regurgitation is trivial. Aortic Valve: The aortic valve is tricuspid. Aortic valve regurgitation is not visualized. No aortic stenosis is present. Pulmonic Valve: The pulmonic valve was normal in structure. Pulmonic valve regurgitation is trivial. Aorta: The aortic root is normal in size and structure. Venous: The inferior vena cava is normal in size with less than 50% respiratory variability, suggesting right atrial pressure of 8 mmHg. IAS/Shunts: No atrial level shunt detected by color flow Doppler.  LEFT VENTRICLE PLAX 2D LVIDd:         4.60 cm LVIDs:         3.30 cm LV PW:         1.20 cm LV IVS:        1.20 cm LVOT diam:     1.80 cm LV SV:         26 LV SV Index:   18 LVOT Area:     2.54 cm  RIGHT VENTRICLE            IVC RV S prime:     7.95 cm/s  IVC diam: 1.60 cm LEFT ATRIUM           Index        RIGHT ATRIUM           Index LA diam:      4.30 cm 2.96 cm/m   RA Area:     12.00 cm LA Vol (A4C): 40.0 ml 27.51 ml/m  RA Volume:   28.30 ml  19.46 ml/m  AORTIC VALVE LVOT Vmax:    76.00 cm/s LVOT Vmean:  44.000 cm/s LVOT VTI:    0.104 m  AORTA Ao Root diam: 2.70 cm Ao Asc diam:  2.70 cm TRICUSPID VALVE TR Peak grad:   19.4 mmHg TR Vmax:        220.00 cm/s  SHUNTS Systemic VTI:  0.10 m Systemic Diam: 1.80  cm Dalton McleanMD Electronically signed by Ezra Kanner Signature Date/Time: 09/07/2024/2:02:45 PM    Final     Cardiac Studies   Echo:  1. Left ventricular ejection fraction, by estimation, is 60 to 65%. The  left ventricle has normal function. The left ventricle has no regional  wall motion abnormalities. Left ventricular diastolic function could not  be evaluated.   2. Right ventricular systolic function is normal. The right ventricular  size is normal. There is normal pulmonary artery systolic pressure.   3. Left atrial size was mildly dilated.   4. The mitral valve is degenerative. Mild mitral valve regurgitation.  Mild mitral stenosis. The mean mitral valve gradient is 7.5 mmHg with  average heart rate of 120 bpm. There is a 36 prosthetic prosthetic  annuloplasty ring present in the mitral  position.   5. The aortic valve is tricuspid. Aortic valve regurgitation is not  visualized. No aortic stenosis is present.   6. The inferior vena cava is normal in size with greater than 50%  respiratory variability, suggesting right atrial pressure of 3 mmHg.    Patient Profile     68 y.o. 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   Assessment & Plan    UTI:  Treatment per primary team.    MVP:  Echo 12/28 as above with stable anatomy.  Atrial fib:      She tolerates Eliquis .  We have started  PO amiodarone  taper for rate control.  She remains on very low dose digoxin .  She cannot be transported to the office easily for follow appts so we will arrange follow up virtual visits with the EP clinic.  Labs, including a future dig level given the addition of amio, can be ordered through her skilled care facility.   She has a follow up virtual visit on Feb 25.  Her ventricular rate is still elevated and will likely be mildly elevated at her skilled facility.  However, she is not in distress and I will try to creep up on the beta blocker although her BP is low.  Perhaps as she recovers more from her UTI we will be able to resume higher dose beta blocker.  For now she needs BP support with midodrine . Discharge on midodrine  5 mg bid (first thing in the AM and about 6 hours later.)    For questions or updates, please contact [facility] Please consult [email or web address] for contact info under Cardiology/STEMI.   Signed, Lynwood Schilling, MD  09/08/2024, 8:29 AM    "

## 2024-09-08 NOTE — Evaluation (Signed)
 Clinical/Bedside Swallow Evaluation Patient Details  Name: MATISHA TERMINE MRN: [id number] Date of Birth: 01/17/1956  Today's Date: 09/08/2024 Time: SLP Start Time (ACUTE ONLY): 1005 SLP Stop Time (ACUTE ONLY): 1025 SLP Time Calculation (min) (ACUTE ONLY): 20 min  Past Medical History:  Past Medical History:  Diagnosis Date   Astrocytoma brain tumor (HCC) 1971   astrocytoma treated with radiation   C. difficile colitis 11/2011   complicated by sepsis/ARDS and prolonged hosp   Depression    Hyperlipidemia    Mitral regurgitation 10/2011   severe, s/p min invasive repair and annuloplasty   Mitral valve prolapse    Neuromuscular disorder (HCC)    numbness in hands and feet   Osteoarthritis    Osteoporosis    DEXA @ [facility] 11/2012: -3.5, prior tx Reclast, last in 2011, rec change to Prolia    Overactive bladder    Pleural effusion, left 11/11/2011   Vitamin D  deficiency    Past Surgical History:  Past Surgical History:  Procedure Laterality Date   CARDIAC CATHETERIZATION     Jan 2013   KNEE ARTHROSCOPY Right    MITRAL VALVE REPAIR  10/15/2011   Procedure: MINIMALLY INVASIVE MITRAL VALVE REPAIR (MVR);  Surgeon: Sudie VEAR Laine, MD;  Location: [facility] OR;  Service: Open Heart Surgery;  Laterality: Right;   TRANSESOPHAGEAL ECHOCARDIOGRAM  7/12   US  ECHOCARDIOGRAPHY  6/12   VENTRICULOPERITONEAL SHUNT  1971   HPI:  APPHIA CROPLEY is a 68 y.o. 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 She was found to have acute UTI, atrial fibrillation with RVR. Patient is currently on a Dys2/thin liquid diet. Last MBS completed 03/2024 recommended Dys3/thin  liquids.    Assessment / Plan / Recommendation  Clinical Impression  A bedside swallow evaluation was completed to assess for s/sx of oropharyngeal dysphagia. Oral mechanism exam revealed functional facial and oral strength for PO consumption, though unable to fully assess due to patient with difficulty following directions. POs administered included thin liquids via cup/straw, puree, D2 and D3 textures. Patient with mildly prolonged mastication of D3 solids, however with complete oral clearance. Intermittent delayed throat clears present, though this was observed before/after PO intake. Patients sister present and reports hoarse vocal quality with intermittent throat clears at baseline. This is consistent with prior ST notes - MBS in 09/2022 reported coughing present without airway invasion and MBS 03/2024 recorded functional pharyngeal swallow. Recommend continuation of D2/thin liquid diet with use of standardized precautions including sitting upright during PO and taking small bites/sips at a slow rate. Recommend full supervision during mealtimes.   SLP Visit Diagnosis: Dysphagia, oropharyngeal phase (R13.12)    Aspiration Risk  Mild aspiration risk    Diet Recommendation Thin liquid;Dysphagia 2 (Fine chop)    Liquid Administration via: Straw Medication Administration: Crushed with puree (whole if unable to crush) Supervision: Staff to assist with self feeding Compensations: Slow rate;Small sips/bites Postural Changes: Seated upright at 90 degrees    Other Recommendations Oral Care Recommendations: Oral care BID     Functional Status Assessment Patient has had a recent decline in their functional status and/or demonstrates limited ability to make significant improvements in function in a reasonable and predictable amount of time (long hx of intermittent dysphagia)  Frequency and Duration min 1 x/week  3 weeks       Prognosis Prognosis for improved oropharyngeal function: Fair Barriers  to Reach Goals: Cognitive deficits;Severity of deficits      Swallow Study   General HPI: KIYANA VAZGUEZ is a 68 y.o. 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 She was found to have acute UTI, atrial fibrillation with RVR. Patient is currently on a Dys2/thin liquid diet. Last MBS completed 03/2024 recommended Dys3/thin liquids. Type of Study: Bedside Swallow Evaluation Previous Swallow Assessment: 03/2024 Diet Prior to this Study: Dysphagia 2 (finely chopped);Thin liquids (Level 0) Respiratory Status: Room air Behavior/Cognition: Alert;Cooperative Oral Care Completed by SLP: No Oral Cavity - Dentition: Adequate natural dentition Self-Feeding Abilities: Total assist Patient Positioning: Upright in bed Baseline Vocal Quality: Hoarse;Low vocal intensity Volitional Cough: Weak Volitional Swallow: Able to elicit    Oral/Motor/Sensory Function Overall Oral Motor/Sensory Function: Mild impairment Facial ROM: Within Functional Limits Facial Symmetry: Within Functional Limits Lingual ROM: Within Functional Limits Lingual Symmetry: Within Functional Limits Lingual Strength:  (difficult to assess) Velum: Within Functional Limits Mandible: Within Functional Limits   Ice Chips Ice chips: Not tested   Thin Liquid Thin Liquid: Impaired Presentation: Cup;Straw Pharyngeal  Phase Impairments: Suspected delayed Swallow;Throat Clearing - Delayed    Nectar Thick Nectar Thick Liquid: Not tested   Honey Thick Honey Thick Liquid: Not tested   Puree Puree: Within functional limits Presentation: Spoon   Solid     Solid: Impaired Pharyngeal Phase Impairments:  Throat Clearing - Delayed      Agnes Probert M.A., CCC-SLP 09/08/2024,10:38 AM

## 2024-09-08 NOTE — TOC Transition Note (Signed)
 Transition of Care [facility]) - Discharge Note   Patient Details  Name: Michaela [name] MRN: [id number] Date of Birth: [birth year]  [facility] CM/SW Contact:  Almarie CHRISTELLA Goodie, LCSW Phone Number: 09/08/2024, 12:35 PM   Clinical Narrative:   CSW updated by MD that patient is stable for return to [facility] today. CSW spoke with social worker, Clotilda, confirmed patient can return. CSW updated sister, Luke, she is in agreement. Transport arranged with [facility] for next available.  Nurse to call report to [phone], Room [phone].    Final next level of care: Skilled Nursing Facility Barriers to Discharge: Barriers Resolved   Patient Goals and CMS Choice   CMS Medicare.gov Compare Post Acute Care list provided to:: Patient Choice offered to / list presented to : Patient, Sibling      Discharge Placement              Patient chooses bed at: [facility] Patient to be transferred to facility by: [facility] Name of family member notified: Luke Patient and family notified of of transfer: 09/08/24  Discharge Plan and Services Additional resources added to the After Visit Summary for   In-house Referral: Clinical Social Work   Post Acute Care Choice: Skilled Nursing Facility                               Social Drivers of Health (SDOH) Interventions SDOH Screenings   Tobacco Use: Medium Risk (09/03/2024)     Readmission Risk Interventions     No data to display

## 2024-09-08 NOTE — Plan of Care (Signed)
  Problem: Clinical Measurements: Goal: Ability to maintain clinical measurements within normal limits will improve Outcome: Progressing Goal: Will remain free from infection Outcome: Progressing Goal: Diagnostic test results will improve Outcome: Progressing Goal: Respiratory complications will improve Outcome: Progressing Goal: Cardiovascular complication will be avoided Outcome: Progressing   Problem: Health Behavior/Discharge Planning: Goal: Ability to manage health-related needs will improve Outcome: Progressing   Problem: Activity: Goal: Risk for activity intolerance will decrease Outcome: Progressing   Problem: Nutrition: Goal: Adequate nutrition will be maintained Outcome: Progressing   Problem: Elimination: Goal: Will not experience complications related to bowel motility Outcome: Progressing Goal: Will not experience complications related to urinary retention Outcome: Progressing   Problem: Safety: Goal: Ability to remain free from injury will improve Outcome: Progressing   Problem: Pain Managment: Goal: General experience of comfort will improve and/or be controlled Outcome: Progressing   Problem: Skin Integrity: Goal: Risk for impaired skin integrity will decrease Outcome: Progressing

## 2024-11-03 ENCOUNTER — Ambulatory Visit (HOSPITAL_COMMUNITY): Admitting: Nurse Practitioner
